# Patient Record
Sex: Male | Born: 1935
Health system: Southern US, Community
[De-identification: ages and names within clinical notes are randomized; demographics above are authoritative.]

## PROBLEM LIST (undated history)

## (undated) DIAGNOSIS — I714 Abdominal aortic aneurysm, without rupture, unspecified: Secondary | ICD-10-CM

## (undated) DIAGNOSIS — R42 Dizziness and giddiness: Secondary | ICD-10-CM

## (undated) DIAGNOSIS — I739 Peripheral vascular disease, unspecified: Secondary | ICD-10-CM

## (undated) DIAGNOSIS — F419 Anxiety disorder, unspecified: Secondary | ICD-10-CM

## (undated) DIAGNOSIS — N400 Enlarged prostate without lower urinary tract symptoms: Secondary | ICD-10-CM

## (undated) DIAGNOSIS — E785 Hyperlipidemia, unspecified: Secondary | ICD-10-CM

## (undated) DIAGNOSIS — M1990 Unspecified osteoarthritis, unspecified site: Secondary | ICD-10-CM

## (undated) DIAGNOSIS — C3492 Malignant neoplasm of unspecified part of left bronchus or lung: Secondary | ICD-10-CM

## (undated) HISTORY — DX: Abdominal aortic aneurysm, without rupture: I71.4

## (undated) HISTORY — PX: OTHER SURGICAL HISTORY: SHX169

## (undated) HISTORY — PX: HEMORRHOID SURGERY: SHX153

## (undated) HISTORY — PX: HERNIA REPAIR: SHX51

## (undated) HISTORY — DX: Abdominal aortic aneurysm, without rupture, unspecified: I71.40

---

## 2010-09-19 NOTE — H&P (Signed)
Edward Campos is an 77 y.o. male.   Chief Complaint: right inguinal hernia HPI: Has developed a right inguinal hernia over the past month.  Made worse with straining.  No past medical history on file.  No past surgical history on file.  No family history on file. Social History:  does not have a smoking history on file. He does not have any smokeless tobacco history on file. His alcohol and drug histories not on file.  Allergies: Allergies not on file  No current facility-administered medications on file as of .   No current outpatient prescriptions on file as of .    No results found for this or any previous visit (from the past 48 hour(s)). No results found.  Review of Systems  Constitutional: Negative.   HENT: Negative.   Eyes: Negative.   Respiratory: Negative.   Cardiovascular: Negative.   Gastrointestinal: Negative.   Genitourinary: Negative.   Musculoskeletal: Negative.   Skin: Negative.   Neurological: Negative.   Endo/Heme/Allergies: Negative.   Psychiatric/Behavioral: Negative.     There were no vitals taken for this visit. Physical Exam  Constitutional: He is oriented to person, place, and time. He appears well-developed and well-nourished.  HENT:  Head: Normocephalic and atraumatic.  Eyes: Pupils are equal, round, and reactive to light.  Neck: Normal range of motion. Neck supple.  Cardiovascular: Normal rate, regular rhythm and normal heart sounds.   Respiratory: Effort normal and breath sounds normal.  GI: Soft. Bowel sounds are normal. He exhibits no distension and no mass. There is no tenderness.       Reducible right inguinal hernia noted.  Genitourinary: Penis normal.  Musculoskeletal: Normal range of motion.  Neurological: He is alert and oriented to person, place, and time.  Skin: Skin is warm and dry.  Psychiatric: He has a normal mood and affect. His behavior is normal. Judgment and thought content normal.     Assessment/Plan Right inguinal  hernia/Scheduled for right inguinal herniorrhaphy on 10/16/10.  Marbeth Smedley A 09/19/2010, 5:21 PM   2

## 2010-10-10 ENCOUNTER — Encounter (HOSPITAL_COMMUNITY)
Admission: RE | Admit: 2010-10-10 | Discharge: 2010-10-10 | Disposition: A | Discharge status: Home / Self Care | Payer: Medicare Other | Source: Ambulatory Visit | Attending: General Surgery | Admitting: General Surgery

## 2010-10-10 ENCOUNTER — Encounter (HOSPITAL_COMMUNITY): Payer: Self-pay

## 2010-10-10 ENCOUNTER — Other Ambulatory Visit: Payer: Self-pay

## 2010-10-10 HISTORY — DX: Benign prostatic hyperplasia without lower urinary tract symptoms: N40.0

## 2010-10-10 HISTORY — DX: Hyperlipidemia, unspecified: E78.5

## 2010-10-10 HISTORY — DX: Unspecified osteoarthritis, unspecified site: M19.90

## 2010-10-10 HISTORY — DX: Dizziness and giddiness: R42

## 2010-10-10 LAB — BASIC METABOLIC PANEL
BUN: 15 mg/dL (ref 6–23)
Creatinine, Ser: 0.84 mg/dL (ref 0.50–1.35)
GFR calc Af Amer: >60 mL/min (ref >60)
GFR calc non Af Amer: >60 mL/min (ref >60)

## 2010-10-10 LAB — DIFFERENTIAL
Basophils Absolute: 0.1 10*3/uL (ref 0.0–0.1)
Basophils Relative: 1 % (ref 0–1)
Eosinophils Absolute: 0.3 10*3/uL (ref 0.0–0.7)
Monocytes Absolute: 0.8 10*3/uL (ref 0.1–1.0)
Neutro Abs: 6 10*3/uL (ref 1.7–7.7)
Neutrophils Relative %: 65 % (ref 43–77)

## 2010-10-10 LAB — CBC
MCH: 32.9 pg (ref 26.0–34.0)
MCHC: 36.4 g/dL — ABNORMAL HIGH (ref 30.0–36.0)
RDW: 12.7 % (ref 11.5–15.5)

## 2010-10-10 LAB — SURGICAL PCR SCREEN: Staphylococcus aureus: NEGATIVE

## 2010-10-10 NOTE — Patient Instructions (Addendum)
20 Edward Campos  10/10/2010   Your procedure is scheduled on: 10/16/10  Report to [facility] at [place] [facility].  Call this number if you have problems the morning of surgery: [phone]   Remember:   Do not eat food:After Midnight.  Do not drink clear liquids: After Midnight.  Take these medicines the morning of surgery with A SIP OF WATER:no meds needed   Do not wear jewelry, make-up or nail polish.  Do not wear lotions, powders, or perfumes. You may wear deodorant.  Do not shave 48 hours prior to surgery.  Do not bring valuables to the hospital.  Contacts, dentures or bridgework may not be worn into surgery.  Leave suitcase in the car. After surgery it may be brought to your room.  For patients admitted to the hospital, checkout time is 11:00 AM the day of discharge.   Patients discharged the day of surgery will not be allowed to drive home.  Name and phone number of your driver: family  Special Instructions: CHG Shower Use Special Wash: 1/2 bottle night before surgery and 1/2 bottle morning of surgery.   Please read over the following fact sheets that you were given: Pain Booklet, MRSA Information, Surgical Site Infection Prevention, Anesthesia Post-op Instructions and Care and Recovery After Surgery PATIENT INSTRUCTIONS POST-ANESTHESIA  IMMEDIATELY FOLLOWING SURGERY:  Do not drive or operate machinery for the first twenty four hours after surgery.  Do not make any important decisions for twenty four hours after surgery or while taking narcotic pain medications or sedatives.  If you develop intractable nausea and vomiting or a severe headache please notify your doctor immediately.  FOLLOW-UP:  Please make an appointment with your surgeon as instructed. You do not need to follow up with anesthesia unless specifically instructed to do so.  WOUND CARE INSTRUCTIONS (if applicable):  Keep a dry clean dressing on the anesthesia/puncture wound site if there is drainage.  Once the wound has quit  draining you may leave it open to air.  Generally you should leave the bandage intact for twenty four hours unless there is drainage.  If the epidural site drains for more than 36-48 hours please call the anesthesia department.  QUESTIONS?:  Please feel free to call your physician or the hospital operator if you have any questions, and they will be happy to assist you.     [facility] Anesthesia Department [address] [place] [phone]

## 2010-10-16 ENCOUNTER — Encounter (HOSPITAL_COMMUNITY): Payer: Self-pay | Admitting: Anesthesiology

## 2010-10-16 ENCOUNTER — Encounter (HOSPITAL_COMMUNITY): Payer: Self-pay

## 2010-10-16 ENCOUNTER — Ambulatory Visit (HOSPITAL_COMMUNITY): Payer: Medicare Other | Admitting: Anesthesiology

## 2010-10-16 ENCOUNTER — Encounter (HOSPITAL_COMMUNITY)
Admission: RE | Disposition: A | Discharge status: Home / Self Care | Payer: Self-pay | Source: Ambulatory Visit | Attending: General Surgery

## 2010-10-16 ENCOUNTER — Ambulatory Visit (HOSPITAL_COMMUNITY)
Admission: RE | Admit: 2010-10-16 | Discharge: 2010-10-16 | Disposition: A | Discharge status: Home / Self Care | Payer: Medicare Other | Source: Ambulatory Visit | Attending: General Surgery | Admitting: General Surgery

## 2010-10-16 DIAGNOSIS — Z4789 Encounter for other orthopedic aftercare: Secondary | ICD-10-CM

## 2010-10-16 DIAGNOSIS — K4090 Unilateral inguinal hernia, without obstruction or gangrene, not specified as recurrent: Secondary | ICD-10-CM | POA: Insufficient documentation

## 2010-10-16 DIAGNOSIS — Z01812 Encounter for preprocedural laboratory examination: Secondary | ICD-10-CM | POA: Insufficient documentation

## 2010-10-16 HISTORY — PX: INGUINAL HERNIA REPAIR: SHX194

## 2010-10-16 SURGERY — REPAIR, HERNIA, INGUINAL, ADULT
Anesthesia: General | Laterality: Right | Wound class: Clean

## 2010-10-16 MED ORDER — FENTANYL CITRATE 0.05 MG/ML IJ SOLN: 25.0000 ug | INTRAMUSCULAR | Status: DC | PRN

## 2010-10-16 MED ORDER — BUPIVACAINE HCL (PF) 0.5 % IJ SOLN
INTRAMUSCULAR | Status: DC | PRN
  Administered 2010-10-16: 10 mL

## 2010-10-16 MED ORDER — LIDOCAINE IN DEXTROSE 5-7.5 % IV SOLN
INTRAVENOUS | Status: DC | PRN
  Administered 2010-10-16: 100 mg via INTRATHECAL

## 2010-10-16 MED ORDER — KETOROLAC TROMETHAMINE 30 MG/ML IJ SOLN
30.0000 mg | Freq: Once | INTRAMUSCULAR | Status: AC
  Administered 2010-10-16: 30 mg via INTRAVENOUS

## 2010-10-16 MED ORDER — MIDAZOLAM HCL 2 MG/2ML IJ SOLN
1.0000 mg | INTRAMUSCULAR | Status: DC | PRN
  Administered 2010-10-16: 2 mg via INTRAVENOUS

## 2010-10-16 MED ORDER — EPHEDRINE SULFATE 50 MG/ML IJ SOLN
INTRAMUSCULAR | Status: AC
  Filled 2010-10-16: qty 1

## 2010-10-16 MED ORDER — LACTATED RINGERS IV SOLN
INTRAVENOUS | Status: DC | PRN
  Administered 2010-10-16: 07:00:00 via INTRAVENOUS

## 2010-10-16 MED ORDER — SODIUM CHLORIDE 0.9 % IR SOLN
Status: DC | PRN
  Administered 2010-10-16: 1000 mL

## 2010-10-16 MED ORDER — HYDROCODONE-ACETAMINOPHEN 5-325 MG PO TABS: ORAL_TABLET | ORAL | Status: DC

## 2010-10-16 MED ORDER — EPHEDRINE SULFATE 50 MG/ML IJ SOLN
INTRAMUSCULAR | Status: DC | PRN
  Administered 2010-10-16 (×2): 10 mg via INTRAVENOUS

## 2010-10-16 MED ORDER — ENOXAPARIN SODIUM 40 MG/0.4ML ~~LOC~~ SOLN
40.0000 mg | Freq: Once | SUBCUTANEOUS | Status: AC
  Administered 2010-10-16: 40 mg via SUBCUTANEOUS

## 2010-10-16 MED ORDER — FENTANYL CITRATE 0.05 MG/ML IJ SOLN
INTRAMUSCULAR | Status: DC | PRN
  Administered 2010-10-16: 25 ug via INTRAVENOUS

## 2010-10-16 MED ORDER — FENTANYL CITRATE 0.05 MG/ML IJ SOLN
INTRAMUSCULAR | Status: DC | PRN
  Administered 2010-10-16: 25 ug via INTRATHECAL

## 2010-10-16 MED ORDER — VANCOMYCIN HCL IN DEXTROSE 1-5 GM/200ML-% IV SOLN
INTRAVENOUS | Status: AC
  Administered 2010-10-16: 1000 mg via INTRAVENOUS
  Filled 2010-10-16: qty 200

## 2010-10-16 MED ORDER — PROPOFOL 10 MG/ML IV EMUL
INTRAVENOUS | Status: AC
  Filled 2010-10-16: qty 20

## 2010-10-16 MED ORDER — VANCOMYCIN HCL IN DEXTROSE 1-5 GM/200ML-% IV SOLN
1000.0000 mg | Freq: Once | INTRAVENOUS | Status: AC
  Administered 2010-10-16: 1000 mg via INTRAVENOUS

## 2010-10-16 MED ORDER — ONDANSETRON HCL 4 MG/2ML IJ SOLN: 4.0000 mg | Freq: Once | INTRAMUSCULAR | Status: DC | PRN

## 2010-10-16 MED ORDER — KETOROLAC TROMETHAMINE 30 MG/ML IJ SOLN
INTRAMUSCULAR | Status: AC
  Administered 2010-10-16: 30 mg via INTRAVENOUS
  Filled 2010-10-16: qty 1

## 2010-10-16 MED ORDER — MIDAZOLAM HCL 2 MG/2ML IJ SOLN
INTRAMUSCULAR | Status: AC
  Filled 2010-10-16: qty 2

## 2010-10-16 MED ORDER — BUPIVACAINE HCL (PF) 0.5 % IJ SOLN
INTRAMUSCULAR | Status: AC
  Filled 2010-10-16: qty 30

## 2010-10-16 MED ORDER — ENOXAPARIN SODIUM 40 MG/0.4ML ~~LOC~~ SOLN
SUBCUTANEOUS | Status: AC
  Filled 2010-10-16: qty 0.4

## 2010-10-16 MED ORDER — FENTANYL CITRATE 0.05 MG/ML IJ SOLN
INTRAMUSCULAR | Status: AC
  Filled 2010-10-16: qty 2

## 2010-10-16 MED ORDER — MIDAZOLAM HCL 2 MG/2ML IJ SOLN
INTRAMUSCULAR | Status: AC
  Administered 2010-10-16: 2 mg via INTRAVENOUS
  Filled 2010-10-16: qty 2

## 2010-10-16 MED ORDER — LACTATED RINGERS IV SOLN
INTRAVENOUS | Status: DC
  Administered 2010-10-16: 07:00:00 via INTRAVENOUS

## 2010-10-16 MED ORDER — PROPOFOL 10 MG/ML IV EMUL
INTRAVENOUS | Status: DC | PRN
  Administered 2010-10-16: 25 ug/kg/min via INTRAVENOUS

## 2010-10-16 MED ORDER — VANCOMYCIN HCL 1000 MG IV SOLR
1000.0000 mg | INTRAVENOUS | Status: DC | PRN
  Administered 2010-10-16: 1 g via INTRAVENOUS

## 2010-10-16 MED ORDER — MIDAZOLAM HCL 5 MG/5ML IJ SOLN
INTRAMUSCULAR | Status: DC | PRN
  Administered 2010-10-16: 1 mg via INTRAVENOUS

## 2010-10-16 SURGICAL SUPPLY — 43 items
ADH SKN CLS APL DERMABOND .7 (GAUZE/BANDAGES/DRESSINGS) ×1
BAG HAMPER (MISCELLANEOUS) ×2 IMPLANT
CLOTH BEACON ORANGE TIMEOUT ST (SAFETY) ×2 IMPLANT
COVER LIGHT HANDLE STERIS (MISCELLANEOUS) ×4 IMPLANT
DECANTER SPIKE VIAL GLASS SM (MISCELLANEOUS) ×2 IMPLANT
DERMABOND ADVANCED (GAUZE/BANDAGES/DRESSINGS) ×1
DERMABOND ADVANCED .7 DNX12 (GAUZE/BANDAGES/DRESSINGS) ×1 IMPLANT
DRAIN PENROSE 18X.75 LTX STRL (MISCELLANEOUS) ×2 IMPLANT
ELECT REM PT RETURN 9FT ADLT (ELECTROSURGICAL) ×2
ELECTRODE REM PT RTRN 9FT ADLT (ELECTROSURGICAL) ×1 IMPLANT
FORMALIN 10 PREFIL 120ML (MISCELLANEOUS) IMPLANT
GLOVE BIO SURGEON STRL SZ7.5 (GLOVE) ×2 IMPLANT
GLOVE ECLIPSE 6.5 STRL STRAW (GLOVE) ×1 IMPLANT
GLOVE ECLIPSE 7.0 STRL STRAW (GLOVE) ×1 IMPLANT
GLOVE ECLIPSE 8.0 STRL XLNG CF (GLOVE) ×1 IMPLANT
GLOVE INDICATOR 7.0 STRL GRN (GLOVE) ×2 IMPLANT
GLOVE INDICATOR 8.5 STRL (GLOVE) ×2 IMPLANT
GOWN BRE IMP SLV AUR XL STRL (GOWN DISPOSABLE) ×6 IMPLANT
GOWN STRL REIN 3XL LVL4 (GOWN DISPOSABLE) ×1 IMPLANT
INST SET MINOR GENERAL (KITS) ×2 IMPLANT
KIT ROOM TURNOVER APOR (KITS) ×2 IMPLANT
MANIFOLD NEPTUNE II (INSTRUMENTS) ×2 IMPLANT
MESH MARLEX PLUG MEDIUM (Mesh General) ×1 IMPLANT
NDL HYPO 25X1 1.5 SAFETY (NEEDLE) ×1 IMPLANT
NEEDLE HYPO 25X1 1.5 SAFETY (NEEDLE) ×2 IMPLANT
NS IRRIG 1000ML POUR BTL (IV SOLUTION) ×2 IMPLANT
PACK MINOR (CUSTOM PROCEDURE TRAY) ×2 IMPLANT
PAD ARMBOARD 7.5X6 YLW CONV (MISCELLANEOUS) ×2 IMPLANT
PAIN PUMP ON-Q 100MLX2ML 2.5IN (PAIN MANAGEMENT) IMPLANT
SET BASIN LINEN APH (SET/KITS/TRAYS/PACK) ×2 IMPLANT
SOL PREP PROV IODINE SCRUB 4OZ (MISCELLANEOUS) ×2 IMPLANT
SUT NOVA NAB GS-22 2 2-0 T-19 (SUTURE) ×2 IMPLANT
SUT NOVAFIL NAB HGS22 2-0 30IN (SUTURE) IMPLANT
SUT SILK 3 0 (SUTURE)
SUT SILK 3-0 18XBRD TIE 12 (SUTURE) ×1 IMPLANT
SUT VIC AB 2-0 CT1 27 (SUTURE) ×2
SUT VIC AB 2-0 CT1 TAPERPNT 27 (SUTURE) ×1 IMPLANT
SUT VIC AB 3-0 SH 27 (SUTURE) ×2
SUT VIC AB 3-0 SH 27X BRD (SUTURE) ×1 IMPLANT
SUT VIC AB 4-0 PS2 27 (SUTURE) ×2 IMPLANT
SUT VICRYL AB 3 0 TIES (SUTURE) IMPLANT
SYR CONTROL 10ML LL (SYRINGE) ×2 IMPLANT
TOWEL OR 17X26 4PK STRL BLUE (TOWEL DISPOSABLE) ×2 IMPLANT

## 2010-10-16 NOTE — Transfer of Care (Signed)
Immediate Anesthesia Transfer of Care Note  Patient: Edward Campos  Procedure(s) Performed:  HERNIA REPAIR INGUINAL ADULT  Patient Location: PACU  Anesthesia Type: Spinal  Level of Consciousness: awake, alert  and oriented  Airway & Oxygen Therapy: Patient Spontanous Breathing and Patient connected to face mask oxygen  Post-op Assessment: Report given to PACU RN and Post -op Vital signs reviewed and stable  Post vital signs: stable  Complications: No apparent anesthesia complications

## 2010-10-16 NOTE — Anesthesia Procedure Notes (Addendum)
Spinal Block  Patient location during procedure: OR Start time: 10/16/2010 8:20 AM Staffing CRNA/Resident: Michae Grimley J Preanesthetic Checklist Completed: patient identified, site marked, surgical consent, pre-op evaluation, timeout performed, IV checked, risks and benefits discussed and monitors and equipment checked Spinal Block Patient position: right lateral decubitus Prep: Betadine Patient monitoring: heart rate, cardiac monitor, continuous pulse ox and blood pressure Approach: midline Location: L3-4 Injection technique: single-shot Needle Needle type: Spinocan  Assessment Sensory level: T8

## 2010-10-16 NOTE — Op Note (Signed)
Patient:  Edward Campos  DOB:  [birth year]  MRN:  [id number]   Preop Diagnosis:  Right inguinal hernia  Postop Diagnosis:  Same, indirect  Procedure:  Right inguinal herniorrhaphy  Surgeon:  Franky Macho, M.D.  Anes:  Spinal  Indications:  Patient is a 77 year old white male who presents with a right inguinal hernia. The risks and benefits of the procedure including bleeding, infection, and a possibly recurrence of the hernia were fully explained to the patient, gave informed consent.  Procedure note:  Patient was placed in the supine position after spinal anesthesia was administered. The right groin region was prepped and draped using the usual sterile technique with Betadine. Surgical site confirmation was performed.  A transverse incision was made in the right groin region down to the external oblique aponeuroses. The aponeurosis was incised to the external ring. A Penrose drain was placed around the spermatic cord. The vas deferens was noted within the spermatic cord. The ilioinguinal nerve was identified and retracted inferiorly from the operative field. An indirect hernia sac was found. This was freed away from the spermatic cord up to the peritoneal reflection and inverted. A medium size Bard mesh plug was then placed in this region secured to the transversalis fascia using a 2-0 Novafil interrupted suture. A mesh patch was then placed along the floor of the inguinal canal and secured superiorly to the conjoined tendon and inferiorly to the shelving edge of Poupart's ligament using 2-0 Novafil interrupted sutures. The internal ring was recreated using a 2-0 Novafil suture. The external oblique aponeuroses was reapproximated using 2-0 Vicryl suture. The subcutaneous layer was reapproximated using 3-0 Vicryl interrupted sutures. The skin was closed using a 4-0 Vicryl subcuticular suture. 0.5% Sensorcaine was instilled in the surrounding wound. Dermabond was then applied.  All tape and needle  counts were correct at the end of the procedure. The patient was transferred back to PACU in stable condition.  Complications:  None  EBL:  Minimal  Specimen:  None

## 2010-10-16 NOTE — Anesthesia Preprocedure Evaluation (Addendum)
Anesthesia Evaluation  Name, MR# and DOB Patient awake  General Assessment Comment  Reviewed: Allergy & Precautions, H&P  and Patient's Chart, lab work & pertinent test results  Airway Mallampati: II  Neck ROM: Full    Dental  (+) Edentulous Upper   Pulmonary (+) shortness of breath and With exertion COPD  clear to auscultation    Cardiovascular Irregular     Neuro/Psych   GI/Hepatic/Renal   Endo/Other    Abdominal   Musculoskeletal   Hematology   Peds  Reproductive/Obstetrics    Anesthesia Other Findings             Anesthesia Physical Anesthesia Plan  ASA: III  Anesthesia Plan: Spinal   Post-op Pain Management:    Induction:   Airway Management Planned: Simple Face Mask  Additional Equipment:   Intra-op Plan:   Post-operative Plan:   Informed Consent: I have reviewed the patients History and Physical, chart, labs and discussed the procedure including the risks, benefits and alternatives for the proposed anesthesia with the patient or authorized representative who has indicated his/her understanding and acceptance.     Plan Discussed with: CRNA  Anesthesia Plan Comments:         Anesthesia Quick Evaluation

## 2010-10-16 NOTE — Interval H&P Note (Signed)
I have evaluated Edward Campos preoperatively, and have identified no interval change in the medical condition or plan of care since the history and physical of record.

## 2010-10-16 NOTE — Anesthesia Postprocedure Evaluation (Signed)
  Anesthesia Post-op Note  Patient: Edward Campos  Procedure(s) Performed:  HERNIA REPAIR INGUINAL ADULT  Patient Location: PACU  Anesthesia Type: Spinal  Level of Consciousness: alert   Airway and Oxygen Therapy: Patient Spontanous Breathing  Post-op Pain: mild  Post-op Assessment: Patient's Cardiovascular Status Stable, Respiratory Function Stable, Patent Airway and No signs of Nausea or vomiting  Post-op Vital Signs: stable  Complications: No apparent anesthesia complications

## 2010-10-17 NOTE — Anesthesia Postprocedure Evaluation (Signed)
  Anesthesia Post-op Note  Patient: Edward Campos  Procedure(s) Performed:  HERNIA REPAIR INGUINAL ADULT  Patient Location: PACU  Anesthesia Type: General  Level of Consciousness: awake, alert , oriented and patient cooperative  Airway and Oxygen Therapy: Patient Spontanous Breathing  Post-op Pain: none  Post-op Assessment: Post-op Vital signs reviewed, Patient's Cardiovascular Status Stable, Respiratory Function Stable, Patent Airway, No signs of Nausea or vomiting, Adequate PO intake and Pain level controlled  Post-op Vital Signs: Reviewed and stable  Complications: No apparent anesthesia complications

## 2010-10-25 ENCOUNTER — Encounter (HOSPITAL_COMMUNITY): Payer: Self-pay | Admitting: General Surgery

## 2011-02-28 NOTE — Addendum Note (Signed)
Addendum  created 02/28/11 1103 by Despina Hidden   Modules edited:Notes Section

## 2014-08-26 DIAGNOSIS — M25551 Pain in right hip: Secondary | ICD-10-CM | POA: Diagnosis not present

## 2014-08-26 DIAGNOSIS — M5416 Radiculopathy, lumbar region: Secondary | ICD-10-CM | POA: Diagnosis not present

## 2014-08-31 DIAGNOSIS — M545 Low back pain: Secondary | ICD-10-CM | POA: Diagnosis not present

## 2014-09-02 DIAGNOSIS — M545 Low back pain: Secondary | ICD-10-CM | POA: Diagnosis not present

## 2014-09-02 DIAGNOSIS — M5416 Radiculopathy, lumbar region: Secondary | ICD-10-CM | POA: Diagnosis not present

## 2014-09-07 DIAGNOSIS — M5416 Radiculopathy, lumbar region: Secondary | ICD-10-CM | POA: Diagnosis not present

## 2014-09-07 DIAGNOSIS — M4806 Spinal stenosis, lumbar region: Secondary | ICD-10-CM | POA: Diagnosis not present

## 2014-09-07 DIAGNOSIS — M545 Low back pain: Secondary | ICD-10-CM | POA: Diagnosis not present

## 2016-09-11 DIAGNOSIS — M1000 Idiopathic gout, unspecified site: Secondary | ICD-10-CM | POA: Diagnosis not present

## 2019-02-18 ENCOUNTER — Other Ambulatory Visit: Payer: Self-pay

## 2019-02-18 DIAGNOSIS — I714 Abdominal aortic aneurysm, without rupture, unspecified: Secondary | ICD-10-CM

## 2019-02-19 ENCOUNTER — Ambulatory Visit (INDEPENDENT_AMBULATORY_CARE_PROVIDER_SITE_OTHER): Payer: Medicare Other | Admitting: Vascular Surgery

## 2019-02-19 ENCOUNTER — Ambulatory Visit (HOSPITAL_COMMUNITY)
Admission: RE | Admit: 2019-02-19 | Discharge: 2019-02-19 | Disposition: A | Discharge status: Home / Self Care | Payer: Medicare Other | Source: Ambulatory Visit | Attending: Vascular Surgery | Admitting: Vascular Surgery

## 2019-02-19 ENCOUNTER — Other Ambulatory Visit: Payer: Self-pay | Admitting: Vascular Surgery

## 2019-02-19 ENCOUNTER — Other Ambulatory Visit: Payer: Self-pay

## 2019-02-19 ENCOUNTER — Encounter: Payer: Self-pay | Admitting: Vascular Surgery

## 2019-02-19 VITALS — BP 109/65 | HR 63 | Temp 97.9°F | Resp 20 | Ht 70.5 in | Wt 156.0 lb

## 2019-02-19 DIAGNOSIS — I739 Peripheral vascular disease, unspecified: Secondary | ICD-10-CM

## 2019-02-19 DIAGNOSIS — I714 Abdominal aortic aneurysm, without rupture, unspecified: Secondary | ICD-10-CM

## 2019-02-19 NOTE — Progress Notes (Signed)
Referring Physician: Dr Orion Modest Patient name: Edward Campos MRN: [id number] DOB: [birth year] Sex: male  REASON FOR CONSULT: Abdominal aortic aneurysm  HPI: Edward Campos is a 85 y.o. male, sent for evaluation of abdominal aortic aneurysm.  This was apparently discovered recently on an MRI exam done for back and leg pain.  Patient does not have family history of aneurysm.  He does not have abdominal pain.  He has chronic back pain.  He also has noticed over the last 4 years he has pain that occurs in the calf and posterior thigh both legs after walking about a block.  Both legs are fairly equal.  He does have a history of tobacco abuse.  He currently smokes about 1/4 pack of cigarettes per day.  He was counseled against this today.  He does not have rest pain in his feet.  He has no nonhealing wounds.  Other medical problems include multijoint arthritis dizziness hyperlipidemia.  These are all currently controlled.  Past Medical History:  Diagnosis Date  . AAA (abdominal aortic aneurysm) ([place])   . Arthritis   . BPH (benign prostatic hyperplasia)   . Dizziness   . Hyperlipidemia    Past Surgical History:  Procedure Laterality Date  . growth removed from chest    . HEMORRHOID SURGERY    . HERNIA REPAIR  12 yrs ago   left inguinal hernia  . INGUINAL HERNIA REPAIR  10/16/2010   Procedure: HERNIA REPAIR INGUINAL ADULT;  Surgeon: Jamesetta So;  Location: [facility];  Service: General;  Laterality: Right;    Family History  Problem Relation Age of Onset  . Pseudochol deficiency Neg Hx   . Malignant hyperthermia Neg Hx   . Hypotension Neg Hx   . Anesthesia problems Neg Hx     SOCIAL HISTORY: Social History   Socioeconomic History  . Marital status: Single    Spouse name: Not on file  . Number of children: Not on file  . Years of education: Not on file  . Highest education level: Not on file  Occupational History  . Not on file  Tobacco Use  . Smoking status: Current Every  Day Smoker    Packs/day: 0.25    Years: 60.00    Pack years: 15.00    Types: Cigarettes  . Smokeless tobacco: Never Used  Substance and Sexual Activity  . Alcohol use: Yes    Alcohol/week: 3.0 standard drinks    Types: 3 Shots of liquor per week    Comment: 3 2oz. shots of bourbon/day  . Drug use: No  . Sexual activity: Not on file  Other Topics Concern  . Not on file  Social History Narrative  . Not on file   Social Determinants of Health   Financial Resource Strain:   . Difficulty of Paying Living Expenses: Not on file  Food Insecurity:   . Worried About Charity fundraiser in the Last Year: Not on file  . Ran Out of Food in the Last Year: Not on file  Transportation Needs:   . Lack of Transportation (Medical): Not on file  . Lack of Transportation (Non-Medical): Not on file  Physical Activity:   . Days of Exercise per Week: Not on file  . Minutes of Exercise per Session: Not on file  Stress:   . Feeling of Stress : Not on file  Social Connections:   . Frequency of Communication with Friends and Family: Not on file  . Frequency of Social  Gatherings with Friends and Family: Not on file  . Attends Religious Services: Not on file  . Active Member of Clubs or Organizations: Not on file  . Attends BankerClub or Organization Meetings: Not on file  . Marital Status: Not on file  Intimate Partner Violence:   . Fear of Current or Ex-Partner: Not on file  . Emotionally Abused: Not on file  . Physically Abused: Not on file  . Sexually Abused: Not on file    Allergies  Allergen Reactions  . Penicillins     unknown    Current Outpatient Medications  Medication Sig Dispense Refill  . ibuprofen (ADVIL,MOTRIN) 200 MG tablet Take 200 mg by mouth every 6 (six) hours as needed. For pain     . Multiple Vitamins-Minerals (MULTIVITAMIN WITH MINERALS) tablet Take 1 tablet by mouth daily.       No current facility-administered medications for this visit.    ROS:   General:  No  weight loss, Fever, chills  HEENT: No recent headaches, no nasal bleeding, no visual changes, no sore throat  Neurologic: No dizziness, blackouts, seizures. No recent symptoms of stroke or mini- stroke. No recent episodes of slurred speech, or temporary blindness.  Cardiac: No recent episodes of chest pain/pressure, no shortness of breath at rest.  No shortness of breath with exertion.  Denies history of atrial fibrillation or irregular heartbeat  Vascular: No history of rest pain in feet.  No history of claudication.  No history of non-healing ulcer, No history of DVT   Pulmonary: No home oxygen, no productive cough, no hemoptysis,  No asthma or wheezing  Musculoskeletal:  [X]  Arthritis, [X]  Low back pain,  [X]  Joint pain  Hematologic:No history of hypercoagulable state.  No history of easy bleeding.  No history of anemia  Gastrointestinal: No hematochezia or melena,  No gastroesophageal reflux, no trouble swallowing  Urinary: [ ]  chronic Kidney disease, [ ]  on HD - [ ]  MWF or [ ]  TTHS, [ ]  Burning with urination, [ ]  Frequent urination, [ ]  Difficulty urinating;   Skin: No rashes  Psychological: No history of anxiety,  No history of depression   Physical Examination  Vitals:   02/19/19 1038  BP: 109/65  Pulse: 63  Resp: 20  Temp: 97.9 F (36.6 C)  SpO2: 99%  Weight: 156 lb (70.8 kg)  Height: 5' 10.5" (1.791 m)    Body mass index is 22.07 kg/m.  General:  Alert and oriented, no acute distress HEENT: Normal Neck: No JVD Cardiac: Regular Rate and Rhythm  Abdomen: Soft, non-tender, non-distended, no mass Skin: No rash Extremity Pulses:  2+ radial, brachial, absent femoral, popliteal dorsalis pedis, posterior tibial pulses bilaterally, firm structure both groins suggestive of heavy calcification of the common femoral artery Musculoskeletal: No deformity or edema  Neurologic: Upper and lower extremity motor 5/5 and symmetric  DATA:  Patient had an aortic ultrasound  today which shows the aortic diameter is 4.6 cm.  The iliac arteries were not visualized due to overlying bowel gas.  I reviewed and interpreted the study.  ASSESSMENT: With 4.6 cm infrarenal abdominal aortic aneurysm.  Unfortunately the iliac arteries were not visualized today and we need to exclude iliac artery aneurysm involvement as well.  I discussed the patient today that if the aneurysm truly is 4.6 cm in diameter we would only consider repair if it got to 5-5 and half centimeters in diameter.  The patient may also have an element of occlusive disease as he does  not have easily palpable pulses in either leg and has some symptoms that could suggest claudication.   PLAN: Patient will be scheduled for CT angiogram with lower extremity runoff to further define his lower extremity arterial tree as well as to rule out iliac aneurysms.  We will also obtain bilateral ABIs on his next office visit.  Consideration will also need to be given for starting a statin and aspirin if he has evidence of peripheral arterial disease.   Fabienne Bruns, MD [facility] Office: [phone] Pager: [phone]

## 2019-03-03 ENCOUNTER — Inpatient Hospital Stay: Admission: RE | Admit: 2019-03-03 | Payer: PRIVATE HEALTH INSURANCE | Source: Ambulatory Visit

## 2019-03-03 ENCOUNTER — Ambulatory Visit
Admission: RE | Admit: 2019-03-03 | Discharge: 2019-03-03 | Disposition: A | Discharge status: Home / Self Care | Payer: Medicare Other | Source: Ambulatory Visit | Attending: Vascular Surgery | Admitting: Vascular Surgery

## 2019-03-03 DIAGNOSIS — I714 Abdominal aortic aneurysm, without rupture, unspecified: Secondary | ICD-10-CM

## 2019-03-03 MED ORDER — IOPAMIDOL (ISOVUE-370) INJECTION 76%
75.0000 mL | Freq: Once | INTRAVENOUS | Status: AC | PRN
  Administered 2019-03-03: 75 mL via INTRAVENOUS

## 2019-03-05 ENCOUNTER — Other Ambulatory Visit: Payer: Self-pay

## 2019-03-05 DIAGNOSIS — I739 Peripheral vascular disease, unspecified: Secondary | ICD-10-CM

## 2019-03-12 ENCOUNTER — Ambulatory Visit (HOSPITAL_COMMUNITY)
Admission: RE | Admit: 2019-03-12 | Discharge: 2019-03-12 | Disposition: A | Discharge status: Home / Self Care | Payer: Medicare Other | Source: Ambulatory Visit | Attending: Vascular Surgery | Admitting: Vascular Surgery

## 2019-03-12 ENCOUNTER — Ambulatory Visit (INDEPENDENT_AMBULATORY_CARE_PROVIDER_SITE_OTHER): Payer: Medicare Other | Admitting: Vascular Surgery

## 2019-03-12 ENCOUNTER — Other Ambulatory Visit: Payer: Self-pay

## 2019-03-12 ENCOUNTER — Encounter: Payer: Self-pay | Admitting: Vascular Surgery

## 2019-03-12 VITALS — BP 132/73 | HR 58 | Temp 97.6°F | Resp 20 | Ht 70.5 in | Wt 163.0 lb

## 2019-03-12 DIAGNOSIS — I714 Abdominal aortic aneurysm, without rupture, unspecified: Secondary | ICD-10-CM

## 2019-03-12 DIAGNOSIS — I739 Peripheral vascular disease, unspecified: Secondary | ICD-10-CM

## 2019-03-12 NOTE — Progress Notes (Signed)
Patient is an 85 year old male who returns today for follow-up after recent CT angiogram of the abdomen and pelvis with lower extremity runoff.  He was initially sent for evaluation of his abdominal aortic aneurysm.  He does not have abdominal pain currently.  He does have intermittent chronic back pain.  He continues to have pain over the last 4 years that occurs in the calf and posterior thigh both legs after walking about a block.  He is continuing to try to quit smoking.  Past Medical History:  Diagnosis Date  . AAA (abdominal aortic aneurysm) ([facility])   . Arthritis   . BPH (benign prostatic hyperplasia)   . Dizziness   . Hyperlipidemia     Past Surgical History:  Procedure Laterality Date  . growth removed from chest    . HEMORRHOID SURGERY    . HERNIA REPAIR  12 yrs ago   left inguinal hernia  . INGUINAL HERNIA REPAIR  10/16/2010   Procedure: HERNIA REPAIR INGUINAL ADULT;  Surgeon: Dalia Heading;  Location: [facility];  Service: General;  Laterality: Right;     Current Outpatient Medications on File Prior to Visit  Medication Sig Dispense Refill  . ibuprofen (ADVIL,MOTRIN) 200 MG tablet Take 200 mg by mouth every 6 (six) hours as needed. For pain     . Multiple Vitamins-Minerals (MULTIVITAMIN WITH MINERALS) tablet Take 1 tablet by mouth daily.       No current facility-administered medications on file prior to visit.   Social History   Socioeconomic History  . Marital status: Single    Spouse name: Not on file  . Number of children: Not on file  . Years of education: Not on file  . Highest education level: Not on file  Occupational History  . Not on file  Tobacco Use  . Smoking status: Current Every Day Smoker    Packs/day: 0.25    Years: 60.00    Pack years: 15.00    Types: Cigarettes  . Smokeless tobacco: Never Used  Substance and Sexual Activity  . Alcohol use: Yes    Alcohol/week: 3.0 standard drinks    Types: 3 Shots of liquor per week    Comment: 3 2oz. shots of  bourbon/day  . Drug use: No  . Sexual activity: Not on file  Other Topics Concern  . Not on file  Social History Narrative  . Not on file   Social Determinants of Health   Financial Resource Strain:   . Difficulty of Paying Living Expenses: Not on file  Food Insecurity:   . Worried About Programme researcher, broadcasting/film/video in the Last Year: Not on file  . Ran Out of Food in the Last Year: Not on file  Transportation Needs:   . Lack of Transportation (Medical): Not on file  . Lack of Transportation (Non-Medical): Not on file  Physical Activity:   . Days of Exercise per Week: Not on file  . Minutes of Exercise per Session: Not on file  Stress:   . Feeling of Stress : Not on file  Social Connections:   . Frequency of Communication with Friends and Family: Not on file  . Frequency of Social Gatherings with Friends and Family: Not on file  . Attends Religious Services: Not on file  . Active Member of Clubs or Organizations: Not on file  . Attends Banker Meetings: Not on file  . Marital Status: Not on file  Intimate Partner Violence:   . Fear of Current  or Ex-Partner: Not on file  . Emotionally Abused: Not on file  . Physically Abused: Not on file  . Sexually Abused: Not on file    Physical exam:  Vitals:   03/12/19 1037  BP: 132/73  Pulse: (!) 58  Resp: 20  Temp: 97.6 F (36.4 C)  SpO2: 99%  Weight: 163 lb (73.9 kg)  Height: 5' 10.5" (1.791 m)   General white male no acute distress  Data: I reviewed the patient's CT angiogram images which shows a 4.7 cm infrarenal abdominal aortic aneurysm.  There is a moderate stenosis of the right external iliac and common femoral artery.  The left external iliac and common femoral arteries are occluded.  There is a small left internal iliac artery aneurysm at 1 cm.  Images obtained of the runoff were intact.  Patient had bilateral ABIs performed today which were 0.7 bilaterally I reviewed and interpreted the study.   Assessment:  #1 abdominal aortic aneurysm 4.7 cm in diameter.  He will need a follow-up ultrasound of his aorta in 6 months time.  2.  Left internal iliac artery aneurysm 1 cm consider repair if greater than 3 cm will need to reimage in several years potentially   3.  Peripheral arterial disease with bilateral iliac and common femoral artery occlusive disease.  I discussed with patient today the possibility that revascularization for his claudication type symptoms would most likely involve an aortobifemoral bypass graft.  I discussed with him that this would be a fairly extensive operation and I did not feel that he would be a candidate for any percutaneous options for these lesions.  If he decides to opt for treatment of his claudication we could certainly do an arteriogram as the next step to consider doing an aortobifemoral bypass graft.  Other options could potentially be a femoral-femoral bypass graft however everything is complicated by the fact he also has an aneurysm.  In discussions with the patient he is not really interested in any large operations due to his age.  He will continue to think about his symptoms and if he wishes to pursue this further we will talk about options for this.    Otherwise he will follow-up for his abdominal aortic aneurysm with an ultrasound of his aorta in 6 months time.  Patient did discuss quitting smoking today and I explained to him that certainly this would decrease his risk of limb loss long-term and improve his overall health.  I did discuss with him that at his current level of perfusion his risk of limb loss life time is less than 5% if he is able to quit smoking.  Ruta Hinds, MD [facility] Office: [phone]

## 2019-03-20 ENCOUNTER — Other Ambulatory Visit (HOSPITAL_COMMUNITY): Payer: Self-pay | Admitting: Vascular Surgery

## 2019-03-20 DIAGNOSIS — I714 Abdominal aortic aneurysm, without rupture, unspecified: Secondary | ICD-10-CM

## 2019-03-20 DIAGNOSIS — I739 Peripheral vascular disease, unspecified: Secondary | ICD-10-CM

## 2019-03-30 ENCOUNTER — Encounter (HOSPITAL_COMMUNITY): Payer: Self-pay | Admitting: Physical Therapy

## 2019-03-30 ENCOUNTER — Ambulatory Visit (HOSPITAL_COMMUNITY): Payer: Medicare Other | Attending: Orthopedic Surgery | Admitting: Physical Therapy

## 2019-03-30 ENCOUNTER — Telehealth (HOSPITAL_COMMUNITY): Payer: Self-pay | Admitting: Physical Therapy

## 2019-03-30 ENCOUNTER — Other Ambulatory Visit: Payer: Self-pay

## 2019-03-30 DIAGNOSIS — G8929 Other chronic pain: Secondary | ICD-10-CM | POA: Insufficient documentation

## 2019-03-30 DIAGNOSIS — M6281 Muscle weakness (generalized): Secondary | ICD-10-CM | POA: Insufficient documentation

## 2019-03-30 DIAGNOSIS — M545 Low back pain: Secondary | ICD-10-CM | POA: Diagnosis present

## 2019-03-30 NOTE — Therapy (Signed)
[facility] [address] [place], [place], [address] Phone: [phone]   Fax:  [phone]  Physical Therapy Evaluation  Patient Details  Name: Edward Campos MRN: [id number] Date of Birth: 1932/09/24 Referring Provider (PT): Estill Bamberg Md   Encounter Date: 03/30/2019  PT End of Session - 03/30/19 1527    Visit Number  1    Number of Visits  8    Date for PT Re-Evaluation  05/01/19    Authorization Type  Medicare (80/20) 60 Visit Limit    Authorization Time Period  03/30/19-05/01/19    PT Start Time  1430    PT Stop Time  1515    PT Time Calculation (min)  45 min    Activity Tolerance  Patient tolerated treatment well    Behavior During Therapy  [facility] for tasks assessed/performed       Past Medical History:  Diagnosis Date  . AAA (abdominal aortic aneurysm) (HCC)   . Arthritis   . BPH (benign prostatic hyperplasia)   . Dizziness   . Hyperlipidemia     Past Surgical History:  Procedure Laterality Date  . growth removed from chest    . HEMORRHOID SURGERY    . HERNIA REPAIR  12 yrs ago   left inguinal hernia  . INGUINAL HERNIA REPAIR  10/16/2010   Procedure: HERNIA REPAIR INGUINAL ADULT;  Surgeon: Dalia Heading;  Location: [facility];  Service: General;  Laterality: Right;    There were no vitals filed for this visit.   Subjective Assessment - 03/30/19 1436    Subjective  Patient presents to physical therapy with complaint of low back pain and BLE weakness. Patient says he injured his low back about 4 years ago and has had pain off and on ever since, but states that since November he began to notice that his legs get weak when he bends over to pick things up in his yard. Patient says he was cautious to begin therapy for his back because he was diagnosed with large aortic abdominal aneurism but says that both of his MDs have cleared him for activity and recommended he begin treatment for his lumbar issues. Patient says his RT foot is  always numb.    Limitations  Standing;Walking;House hold activities;Lifting    How long can you stand comfortably?  No problem, only when bending    How long can you walk comfortably?  100 yards    Patient Stated Goals  Improve strength to prepare for possible upcoming surgery, want to be able to go out, qwalk and play golf.    Currently in Pain?  No/denies         [facility] PT Assessment - 03/30/19 0001      Assessment   Medical Diagnosis  LBP and BLE weakness    Referring Provider (PT)  Estill Bamberg Md    Onset Date/Surgical Date  --   November 2020   Prior Therapy  Not for lumbar      Precautions   Precautions  --   AAA( abdominal aneurism) no stated lifitng restriction   Precaution Comments  avoid heavy lifitng, overly strenuous/ compressive activity      Restrictions   Weight Bearing Restrictions  No      Balance Screen   Has the patient fallen in the past 6 months  No    Has the patient had a decrease in activity level because of a fear of falling?   No  Is the patient reluctant to leave their home because of a fear of falling?   No      Home Public house manager residence    Living Arrangements  Alone      Prior Function   Level of Independence  Independent      Cognition   Overall Cognitive Status  Within Functional Limits for tasks assessed      Observation/Other Assessments   Focus on Therapeutic Outcomes (FOTO)   46% Limited      Sensation   Light Touch  Appears Intact   SLight decrease in sensation to light touch about RLE     Posture/Postural Control   Posture/Postural Control  Postural limitations    Postural Limitations  Decreased lumbar lordosis      ROM / Strength   AROM / PROM / Strength  AROM;Strength      AROM   Overall AROM   Within functional limits for tasks performed   good lumbar AROM, excpet min restriction in extension      Strength   Strength Assessment Site  Hip;Knee;Ankle    Right/Left Hip  Right;Left     Right Hip Flexion  4+/5    Right Hip Extension  4+/5    Right Hip ABduction  4+/5    Left Hip Flexion  5/5    Left Hip Extension  4+/5    Left Hip ABduction  4+/5    Right/Left Knee  Left;Right    Right Knee Flexion  4/5    Right Knee Extension  5/5    Left Knee Flexion  4+/5    Left Knee Extension  5/5    Right/Left Ankle  Right;Left    Right Ankle Dorsiflexion  4+/5    Left Ankle Dorsiflexion  5/5      Palpation   Palpation comment  Min/ Mod tenderness to palpation about RT lumbar paraspiinals      Special Tests    Special Tests  --   (-) slump test bilat; (+) sacral compression     Ambulation/Gait   Ambulation/Gait  Yes    Ambulation/Gait Assistance  7: Independent    Gait Pattern  Within Functional Limits      Balance   Balance Assessed  Yes      Static Standing Balance   Static Standing Balance -  Activities   Tandam Stance - Right Leg;Tandam Stance - Left Leg    Static Standing - Comment/# of Minutes  20 sec; 12 sec                 Objective measurements completed on examination: See above findings.              PT Education - 03/30/19 1439    Education Details  Patient educated on evaluation findings, POC and HEP    Person(s) Educated  Patient    Methods  Explanation;Handout    Comprehension  Verbalized understanding       PT Short Term Goals - 03/30/19 1533      PT SHORT TERM GOAL #1   Title  Patient will be independent with initial HEP to improve functional outcomes    Time  2    Period  Weeks    Status  New    Target Date  04/17/19        PT Long Term Goals - 03/30/19 1533      PT LONG TERM GOAL #1   Title  Patient will improve FOTO score to <35%  to indicate improvement in functional outcomes    Time  4    Period  Weeks    Status  New    Target Date  05/01/19      PT LONG TERM GOAL #2   Title  Patient will be able to maintain tandem stance >30 seconds on BLEs to improve stability and reduce risk for falls    Time  4     Period  Weeks    Status  New    Target Date  05/01/19      PT LONG TERM GOAL #3   Title  Patient will report at least 75% overall improvement in subjective complaint to indicate improvement in ability to perform ADLs.    Time  4    Period  Weeks    Status  New    Target Date  05/01/19      PT LONG TERM GOAL #4   Title  Pateint will be able to stand greater than 1 hour during functional activity like yard work or playing golf    Time  4    Period  Weeks    Status  New    Target Date  05/01/19      PT LONG TERM GOAL #5   Title  Pateitn will be IND with final HEP    Time  4    Period  Weeks    Status  New    Target Date  05/01/19             Plan - 03/30/19 1528    Clinical Impression Statement  Patient is a 86 y.o. male who presents to physical therapy with complaint of low back pain and BLE weakness. Patient demonstrates decreased strength, postural deficits, impaired sensation, balance deficits and decreased activity tolerance which are likely contributing to symptoms of pain and are negatively impacting patient ability to perform ADLs and functional mobility tasks. Patient will benefit from skilled physical therapy services to address these deficits to reduce pain, improve level of function with ADLs, functional mobility tasks, and reduce risk for falls.    Personal Factors and Comorbidities  Age    Examination-Activity Limitations  Bend;Squat;Stand;Transfers;Locomotion Level;Lift    Examination-Participation Restrictions  Community Activity;Yard Work    Stability/Clinical Decision Making  Stable/Uncomplicated    Clinical Decision Making  Low    Rehab Potential  Good    PT Frequency  2x / week    PT Duration  4 weeks    PT Treatment/Interventions  ADLs/Self Care Home Management;Balance training;Therapeutic exercise;Manual techniques;Therapeutic activities;Stair training;Gait training;Neuromuscular re-education;Patient/family education;Taping;Energy conservation;Joint  Manipulations;Spinal Manipulations;Passive range of motion;Functional mobility training;Traction    PT Next Visit Plan  Review goals, assess response to POE HEP, progress BLE and core strenghting as tolerated. Add table core exercise, ab set, ab marching, bridges, progress to sit to stands, sidestepping, add balance activity (tandem stance) as tolerated    PT Home Exercise Plan  03/30/19: POE    Consulted and Agree with Plan of Care  Patient       Patient will benefit from skilled therapeutic intervention in order to improve the following deficits and impairments:  Decreased endurance, Decreased activity tolerance, Decreased strength, Pain, Decreased balance, Decreased mobility, Difficulty walking, Postural dysfunction  Visit Diagnosis: Chronic bilateral low back pain without sciatica  Muscle weakness (generalized)     Problem List There are no problems to display for this patient.  3:38 PM, 03/30/19 Lysbeth Galas  Theresia Bough PT DPT  Physical Therapist with [facility]  [facility]  [phone]   [facility] [address] [place], [place], [address] Phone: [phone]   Fax:  [phone]  Name: Edward Campos MRN: [id number] Date of Birth: Feb 14, 1933

## 2019-03-30 NOTE — Telephone Encounter (Signed)
Pt did not want to come back to back this week

## 2019-03-31 ENCOUNTER — Ambulatory Visit (HOSPITAL_COMMUNITY): Payer: Medicare Other

## 2019-04-01 ENCOUNTER — Other Ambulatory Visit: Payer: Self-pay | Admitting: *Deleted

## 2019-04-01 NOTE — Progress Notes (Signed)
Instructed to be at [facility] for nasal swab 05/04/2019 at 8 am. Report to admitting department at 6:30 am on 05/06/2019 for procedure with Dr. Darrick Penna. NPO past MN night prior. Must have a driver and caregiver for discharge to home. He is going to make plans for this.  Verbalized understanding.

## 2019-04-02 ENCOUNTER — Other Ambulatory Visit: Payer: Self-pay

## 2019-04-02 ENCOUNTER — Encounter (HOSPITAL_COMMUNITY): Payer: Self-pay | Admitting: Physical Therapy

## 2019-04-02 ENCOUNTER — Ambulatory Visit (HOSPITAL_COMMUNITY): Payer: Medicare Other | Admitting: Physical Therapy

## 2019-04-02 DIAGNOSIS — G8929 Other chronic pain: Secondary | ICD-10-CM

## 2019-04-02 DIAGNOSIS — M545 Low back pain, unspecified: Secondary | ICD-10-CM

## 2019-04-02 DIAGNOSIS — M6281 Muscle weakness (generalized): Secondary | ICD-10-CM

## 2019-04-02 NOTE — Therapy (Signed)
[facility] [address] [place], [place], [address] Phone: [phone]   Fax:  [phone]  Physical Therapy Treatment  Patient Details  Name: Edward Campos MRN: [id number] Date of Birth: 1932/04/01 Referring Provider (PT): Estill Bamberg Md   Encounter Date: 04/02/2019  PT End of Session - 04/02/19 1554    Visit Number  2    Number of Visits  8    Date for PT Re-Evaluation  05/01/19    Authorization Type  Medicare (80/20) 60 Visit Limit    Authorization Time Period  03/30/19-05/01/19    PT Start Time  1450    PT Stop Time  1530    PT Time Calculation (min)  40 min    Activity Tolerance  Patient tolerated treatment well    Behavior During Therapy  [facility] for tasks assessed/performed       Past Medical History:  Diagnosis Date  . AAA (abdominal aortic aneurysm) (HCC)   . Arthritis   . BPH (benign prostatic hyperplasia)   . Dizziness   . Hyperlipidemia     Past Surgical History:  Procedure Laterality Date  . growth removed from chest    . HEMORRHOID SURGERY    . HERNIA REPAIR  12 yrs ago   left inguinal hernia  . INGUINAL HERNIA REPAIR  10/16/2010   Procedure: HERNIA REPAIR INGUINAL ADULT;  Surgeon: Dalia Heading;  Location: [facility];  Service: General;  Laterality: Right;    There were no vitals filed for this visit.  Subjective Assessment - 04/02/19 1552    Subjective  Pt reports no significant change from last session.  Reports compliance with HEP.  Brought in report form MRI completed in 2016 of back which indidcates mod-severe lumbar stenoisi, DDD and S1 impingement.  STates bending forward bothers his back the most.    Currently in Pain?  No/denies         [facility] PT Assessment - 04/02/19 0001      Assessment   Medical Diagnosis  LBP and BLE weakness    Referring Provider (PT)  Estill Bamberg Md      Precautions   Precaution Comments  6                   [facility] Adult PT Treatment/Exercise - 04/02/19 0001       Knee/Hip Exercises: Stretches   Active Hamstring Stretch  Right;Left;2 reps;20 seconds    Active Hamstring Stretch Limitations  instructed long sitting and supine with towel    Other Knee/Hip Stretches  knee to chest 2X20" each      Knee/Hip Exercises: Supine   Bridges  Both;2 sets;10 reps    Bridges Limitations  with core stab    Straight Leg Raises  Both;10 reps    Straight Leg Raises Limitations  with core stab and knee extended      Knee/Hip Exercises: Sidelying   Clams  10 reps each with 5" holds      Knee/Hip Exercises: Prone   Hip Extension  Both;10 reps    Hip Extension Limitations  with stab, Rt LE weakner             PT Education - 04/02/19 1503    Education Details  differnece between abdominal bracing and active mm contractions.  Instructions on breathing properly with exercises.  Review of goals, HEP and POC moving forward.    Person(s) Educated  Patient    Methods  Explanation;Demonstration;Tactile cues;Verbal cues  Comprehension  Verbalized understanding;Returned demonstration;Verbal cues required;Tactile cues required;Need further instruction       PT Short Term Goals - 03/30/19 1533      PT SHORT TERM GOAL #1   Title  Patient will be independent with initial HEP to improve functional outcomes    Time  2    Period  Weeks    Status  New    Target Date  04/17/19        PT Long Term Goals - 03/30/19 1533      PT LONG TERM GOAL #1   Title  Patient will improve FOTO score to <35%  to indicate improvement in functional outcomes    Time  4    Period  Weeks    Status  New    Target Date  05/01/19      PT LONG TERM GOAL #2   Title  Patient will be able to maintain tandem stance >30 seconds on BLEs to improve stability and reduce risk for falls    Time  4    Period  Weeks    Status  New    Target Date  05/01/19      PT LONG TERM GOAL #3   Title  Patient will report at least 75% overall improvement in subjective complaint to indicate  improvement in ability to perform ADLs.    Time  4    Period  Weeks    Status  New    Target Date  05/01/19      PT LONG TERM GOAL #4   Title  Pateint will be able to stand greater than 1 hour during functional activity like yard work or playing golf    Time  4    Period  Weeks    Status  New    Target Date  05/01/19      PT LONG TERM GOAL #5   Title  Pateitn will be IND with final HEP    Time  4    Period  Weeks    Status  New    Target Date  05/01/19            Plan - 04/02/19 1555    Clinical Impression Statement  Reviewed HEP, goals and POC moving foward.  Noted pt tends to complete exercises in ballistic motions and also demonstrated that he doe bicycle and full sit ups regularly too.  Pt demonstrated these poorly with excessive stress and strain.  Recommended he stop these at this time and only complete what is given by therapist. Explained core stability concept to pateint and instructed with abdominal bracing and slower, controlled exercises and breathing techniques.  PT able to demonstrate correctly with practice and reported these being more challenging and "feel these" at end of session.  Instructed with hamstring stretches as well as these mm are extremely tight.  Attempted piriformis in sitting, however too tight ot complete; will try supine.    Personal Factors and Comorbidities  Age    Examination-Activity Limitations  Bend;Squat;Stand;Transfers;Locomotion Level;Lift    Examination-Participation Restrictions  Community Activity;Yard Work    Stability/Clinical Decision Making  Stable/Uncomplicated    Rehab Potential  Good    PT Frequency  2x / week    PT Duration  4 weeks    PT Treatment/Interventions  ADLs/Self Care Home Management;Balance training;Therapeutic exercise;Manual techniques;Therapeutic activities;Stair training;Gait training;Neuromuscular re-education;Patient/family education;Taping;Energy conservation;Joint Manipulations;Spinal Manipulations;Passive  range of motion;Functional mobility training;Traction    PT Next Visit Plan  Contiue  with core stabiltiy exercises beginning with mat activities progressing to sit to stands, sidestepping, add balance activity (tandem stance) as tolerated.  Next session add supine piriformis stretch    PT Home Exercise Plan  03/30/19: POE    Consulted and Agree with Plan of Care  Patient       Patient will benefit from skilled therapeutic intervention in order to improve the following deficits and impairments:  Decreased endurance, Decreased activity tolerance, Decreased strength, Pain, Decreased balance, Decreased mobility, Difficulty walking, Postural dysfunction  Visit Diagnosis: Chronic bilateral low back pain without sciatica  Muscle weakness (generalized)     Problem List There are no problems to display for this patient. Lurena Nida, PTA/CLT [phone]   Lurena Nida 04/02/2019, 4:09 PM  [facility] [address] [place], [place], [address] Phone: [phone]   Fax:  [phone]  Name: Mykah Shin MRN: [id number] Date of Birth: [birth year]

## 2019-04-07 ENCOUNTER — Ambulatory Visit (HOSPITAL_COMMUNITY): Payer: Medicare Other

## 2019-04-07 ENCOUNTER — Encounter (HOSPITAL_COMMUNITY): Payer: Self-pay

## 2019-04-07 ENCOUNTER — Telehealth (HOSPITAL_COMMUNITY): Payer: Self-pay

## 2019-04-07 ENCOUNTER — Other Ambulatory Visit: Payer: Self-pay

## 2019-04-07 ENCOUNTER — Ambulatory Visit: Payer: Medicare Other

## 2019-04-07 DIAGNOSIS — M545 Low back pain, unspecified: Secondary | ICD-10-CM

## 2019-04-07 DIAGNOSIS — G8929 Other chronic pain: Secondary | ICD-10-CM

## 2019-04-07 DIAGNOSIS — M6281 Muscle weakness (generalized): Secondary | ICD-10-CM

## 2019-04-07 NOTE — Therapy (Signed)
[facility] [address] [place], [place], [address] Phone: [phone]   Fax:  [phone]  Physical Therapy Treatment  Patient Details  Name: Edward Campos MRN: [id number] Date of Birth: 1932/12/21 Referring Provider (PT): Estill Bamberg Md   Encounter Date: 04/07/2019  PT End of Session - 04/07/19 1457    Visit Number  3    Number of Visits  8    Date for PT Re-Evaluation  05/01/19    Authorization Type  Medicare (80/20) 60 Visit Limit    Authorization Time Period  03/30/19-05/01/19    PT Start Time  1450    PT Stop Time  1528    PT Time Calculation (min)  38 min    Activity Tolerance  Patient tolerated treatment well    Behavior During Therapy  [facility] for tasks assessed/performed       Past Medical History:  Diagnosis Date  . AAA (abdominal aortic aneurysm) (HCC)   . Arthritis   . BPH (benign prostatic hyperplasia)   . Dizziness   . Hyperlipidemia     Past Surgical History:  Procedure Laterality Date  . growth removed from chest    . HEMORRHOID SURGERY    . HERNIA REPAIR  12 yrs ago   left inguinal hernia  . INGUINAL HERNIA REPAIR  10/16/2010   Procedure: HERNIA REPAIR INGUINAL ADULT;  Surgeon: Dalia Heading;  Location: [facility];  Service: General;  Laterality: Right;    There were no vitals filed for this visit.  Subjective Assessment - 04/07/19 1454    Subjective  Pt reports balance is off in the morning as well as knee pain.  Reports improved activity tolerance and balance improves through out the day.  No reoprts of recent fall, no pain today.    Patient Stated Goals  Improve strength to prepare for possible upcoming surgery, want to be able to go out, qwalk and play golf.    Currently in Pain?  No/denies                       [facility] Adult PT Treatment/Exercise - 04/07/19 0001      Knee/Hip Exercises: Stretches   Active Hamstring Stretch  Right;Left;2 reps;20 seconds    Active Hamstring Stretch  Limitations  supine hands behind knee    Piriformis Stretch  Both;2 reps;30 seconds    Piriformis Stretch Limitations  90/90 supine      Knee/Hip Exercises: Standing   Other Standing Knee Exercises  sidestep with RTB 2RT    Other Standing Knee Exercises  tandem stance 3x 30" intermittent HHA      Knee/Hip Exercises: Seated   Sit to Sand  10 reps;without UE support   eccentric control     Knee/Hip Exercises: Supine   Bridges  15 reps    Bridges Limitations  with core stab      Knee/Hip Exercises: Sidelying   Clams  RTB 10x 5" holds BLE               PT Short Term Goals - 03/30/19 1533      PT SHORT TERM GOAL #1   Title  Patient will be independent with initial HEP to improve functional outcomes    Time  2    Period  Weeks    Status  New    Target Date  04/17/19        PT Long Term Goals - 03/30/19 1533  PT LONG TERM GOAL #1   Title  Patient will improve FOTO score to <35%  to indicate improvement in functional outcomes    Time  4    Period  Weeks    Status  New    Target Date  05/01/19      PT LONG TERM GOAL #2   Title  Patient will be able to maintain tandem stance >30 seconds on BLEs to improve stability and reduce risk for falls    Time  4    Period  Weeks    Status  New    Target Date  05/01/19      PT LONG TERM GOAL #3   Title  Patient will report at least 75% overall improvement in subjective complaint to indicate improvement in ability to perform ADLs.    Time  4    Period  Weeks    Status  New    Target Date  05/01/19      PT LONG TERM GOAL #4   Title  Pateint will be able to stand greater than 1 hour during functional activity like yard work or playing golf    Time  4    Period  Weeks    Status  New    Target Date  05/01/19      PT LONG TERM GOAL #5   Title  Pateitn will be IND with final HEP    Time  4    Period  Weeks    Status  New    Target Date  05/01/19            Plan - 04/07/19 1723    Clinical Impression  Statement  Added piriformis stretch in supine mobility, pt tight hip musculature and some difficulty getting positions.  Pt educated on difference of a pulling stretch vs pain with new stretch.  Able to add theraband resistance with clam exercises with good form.  Progressed exercises with additional exercises for gluteal strengthening and balance.  Pt required cueing for posture during sidestep and tandem stance to improve balance, min A for safety.  No reports of pain through session, was limited by fatigue.    Personal Factors and Comorbidities  Age    Examination-Activity Limitations  Bend;Squat;Stand;Transfers;Locomotion Level;Lift    Examination-Participation Restrictions  Community Activity;Yard Work    Stability/Clinical Decision Making  Stable/Uncomplicated    Clinical Decision Making  Low    Rehab Potential  Good    PT Frequency  2x / week    PT Duration  4 weeks    PT Treatment/Interventions  ADLs/Self Care Home Management;Balance training;Therapeutic exercise;Manual techniques;Therapeutic activities;Stair training;Gait training;Neuromuscular re-education;Patient/family education;Taping;Energy conservation;Joint Manipulations;Spinal Manipulations;Passive range of motion;Functional mobility training;Traction    PT Next Visit Plan  Contiue with core stabiltiy exercises beginning with mat activities progressing to sit to stands, sidestepping, add balance activity (tandem stance) as tolerated.  Continue supine piriformis stretch    PT Home Exercise Plan  03/30/19: POE       Patient will benefit from skilled therapeutic intervention in order to improve the following deficits and impairments:  Decreased endurance, Decreased activity tolerance, Decreased strength, Pain, Decreased balance, Decreased mobility, Difficulty walking, Postural dysfunction  Visit Diagnosis: Muscle weakness (generalized)  Chronic bilateral low back pain without sciatica     Problem List There are no problems to  display for this patient.  [address] LPTA; CBIS [phone]  Juel Burrow 04/07/2019, 5:32 PM  [facility]  [address] [place], [place], [address] Phone: [phone]   Fax:  [phone]  Name: Edward Campos MRN: [id number] Date of Birth: 10-05-1932

## 2019-04-07 NOTE — Telephone Encounter (Signed)
Pt requested to have all My Chart, and Email notifications stop. He only wants to be contacted by [place] and his home address.

## 2019-04-09 ENCOUNTER — Ambulatory Visit (HOSPITAL_COMMUNITY): Payer: Medicare Other | Admitting: Physical Therapy

## 2019-04-09 ENCOUNTER — Encounter (HOSPITAL_COMMUNITY): Payer: Self-pay | Admitting: Physical Therapy

## 2019-04-09 ENCOUNTER — Other Ambulatory Visit: Payer: Self-pay

## 2019-04-09 DIAGNOSIS — M545 Low back pain: Secondary | ICD-10-CM | POA: Diagnosis not present

## 2019-04-09 DIAGNOSIS — G8929 Other chronic pain: Secondary | ICD-10-CM

## 2019-04-09 DIAGNOSIS — M6281 Muscle weakness (generalized): Secondary | ICD-10-CM

## 2019-04-09 NOTE — Therapy (Signed)
[facility] [address] [place], [place], [address] Phone: [phone]   Fax:  [phone]  Physical Therapy Treatment  Patient Details  Name: Edward Campos MRN: [id number] Date of Birth: 08-09-1932 Referring Provider (PT): Phylliss Bob Md   Encounter Date: 04/09/2019  PT End of Session - 04/09/19 1525    Visit Number  4    Number of Visits  8    Date for PT Re-Evaluation  05/01/19    Authorization Type  Medicare (80/20) 60 Visit Limit    Authorization Time Period  03/30/19-05/01/19    PT Start Time  1515    PT Stop Time  1600    PT Time Calculation (min)  45 min    Activity Tolerance  Patient tolerated treatment well    Behavior During Therapy  [facility] for tasks assessed/performed       Past Medical History:  Diagnosis Date  . AAA (abdominal aortic aneurysm) ([facility])   . Arthritis   . BPH (benign prostatic hyperplasia)   . Dizziness   . Hyperlipidemia     Past Surgical History:  Procedure Laterality Date  . growth removed from chest    . HEMORRHOID SURGERY    . HERNIA REPAIR  12 yrs ago   left inguinal hernia  . INGUINAL HERNIA REPAIR  10/16/2010   Procedure: HERNIA REPAIR INGUINAL ADULT;  Surgeon: Jamesetta So;  Location: [facility];  Service: General;  Laterality: Right;    There were no vitals filed for this visit.  Subjective Assessment - 04/09/19 1523    Subjective  Patient says his LT anterior hip/ groin has been hurting today. Patient says he felt good after last session and has been doing exercises at home with no problem. Says he walked out to his mailbox this AM and noted increased front hip pain.    Patient Stated Goals  Improve strength to prepare for possible upcoming surgery, want to be able to go out, walk and play golf.    Currently in Pain?  Yes    Pain Score  4     Pain Location  Hip    Pain Orientation  Left;Anterior    Pain Descriptors / Indicators  Aching;Nagging    Pain Type  Acute pain    Pain Onset   Today    Pain Frequency  Intermittent                       [facility] Adult PT Treatment/Exercise - 04/09/19 0001      Exercises   Exercises  Lumbar      Lumbar Exercises: Stretches   Piriformis Stretch  3 reps;30 seconds      Lumbar Exercises: Supine   Ab Set  10 reps    Other Supine Lumbar Exercises  ab marching alternating x20      Knee/Hip Exercises: Stretches   Hip Flexor Stretch  Both;5 reps;10 seconds    Hip Flexor Stretch Limitations  lead foot on 6 inch box       Knee/Hip Exercises: Standing   Heel Raises  20 reps    Other Standing Knee Exercises  sidestep with RTB 2RT, Palloff press, red, x10 each     Other Standing Knee Exercises  tandem stance 3x 30" intermittent HHA      Knee/Hip Exercises: Seated   Sit to Sand  10 reps;without UE support      Knee/Hip Exercises: Supine   Bridges  15 reps  PT Short Term Goals - 03/30/19 1533      PT SHORT TERM GOAL #1   Title  Patient will be independent with initial HEP to improve functional outcomes    Time  2    Period  Weeks    Status  New    Target Date  04/17/19        PT Long Term Goals - 03/30/19 1533      PT LONG TERM GOAL #1   Title  Patient will improve FOTO score to <35%  to indicate improvement in functional outcomes    Time  4    Period  Weeks    Status  New    Target Date  05/01/19      PT LONG TERM GOAL #2   Title  Patient will be able to maintain tandem stance >30 seconds on BLEs to improve stability and reduce risk for falls    Time  4    Period  Weeks    Status  New    Target Date  05/01/19      PT LONG TERM GOAL #3   Title  Patient will report at least 75% overall improvement in subjective complaint to indicate improvement in ability to perform ADLs.    Time  4    Period  Weeks    Status  New    Target Date  05/01/19      PT LONG TERM GOAL #4   Title  Pateint will be able to stand greater than 1 hour during functional activity like yard work or  playing golf    Time  4    Period  Weeks    Status  New    Target Date  05/01/19      PT LONG TERM GOAL #5   Title  Pateitn will be IND with final HEP    Time  4    Period  Weeks    Status  New    Target Date  05/01/19            Plan - 04/09/19 1638    Clinical Impression Statement  Patient tolerated ther ex progressions well. Added hip flexor stretch on step to address complaint of LT anterior hip pain. Patient noted some improvement in sx. Patient required verbal and tactile cueing for proper form and leg placement during added abdominal marching. Patient also cued on breathing technique and to avoid holding breath. Patient noted decreased anterior hip pain post treatment, but did note increased muscle fatigue/ soreness at lateral LT hip.    Personal Factors and Comorbidities  Age    Examination-Activity Limitations  Bend;Squat;Stand;Transfers;Locomotion Level;Lift    Examination-Participation Restrictions  Community Activity;Yard Work    Stability/Clinical Decision Making  Stable/Uncomplicated    Rehab Potential  Good    PT Frequency  2x / week    PT Duration  4 weeks    PT Treatment/Interventions  ADLs/Self Care Home Management;Balance training;Therapeutic exercise;Manual techniques;Therapeutic activities;Stair training;Gait training;Neuromuscular re-education;Patient/family education;Taping;Energy conservation;Joint Manipulations;Spinal Manipulations;Passive range of motion;Functional mobility training;Traction    PT Next Visit Plan  Continue to progress hip and core strengthening, and conditioning as tolerated. Add tandem gait next visit .    PT Home Exercise Plan  03/30/19: POE       Patient will benefit from skilled therapeutic intervention in order to improve the following deficits and impairments:  Decreased endurance, Decreased activity tolerance, Decreased strength, Pain, Decreased balance, Decreased mobility, Difficulty walking, Postural dysfunction  Visit  Diagnosis: Chronic bilateral low back pain without sciatica  Muscle weakness (generalized)     Problem List There are no problems to display for this patient.  4:47 PM, 04/09/19 Georges Lynch PT DPT  Physical Therapist with [facility]  [facility]  [phone]   [facility] [address] [place], [place], [address] Phone: [phone]   Fax:  [phone]  Name: Edward Campos MRN: [id number] Date of Birth: 03/25/1932

## 2019-04-13 ENCOUNTER — Ambulatory Visit (HOSPITAL_COMMUNITY): Payer: Medicare Other | Attending: Orthopedic Surgery

## 2019-04-13 ENCOUNTER — Encounter (HOSPITAL_COMMUNITY): Payer: Self-pay

## 2019-04-13 ENCOUNTER — Other Ambulatory Visit: Payer: Self-pay

## 2019-04-13 DIAGNOSIS — M545 Low back pain: Secondary | ICD-10-CM | POA: Diagnosis present

## 2019-04-13 DIAGNOSIS — G8929 Other chronic pain: Secondary | ICD-10-CM | POA: Diagnosis present

## 2019-04-13 DIAGNOSIS — M6281 Muscle weakness (generalized): Secondary | ICD-10-CM | POA: Diagnosis present

## 2019-04-13 NOTE — Therapy (Signed)
[facility] [address] [place], [place], [address] Phone: [phone]   Fax:  [phone]  Physical Therapy Treatment  Patient Details  Name: Edward Campos MRN: [id number] Date of Birth: October 03, 1932 Referring Provider (PT): Estill Bamberg Md   Encounter Date: 04/13/2019  PT End of Session - 04/13/19 1313    Visit Number  5    Number of Visits  8    Date for PT Re-Evaluation  05/01/19    Authorization Type  Medicare (80/20) 60 Visit Limit    Authorization Time Period  03/30/19-05/01/19    PT Start Time  1315    PT Stop Time  1359    PT Time Calculation (min)  44 min    Equipment Utilized During Treatment  Gait belt    Activity Tolerance  Patient tolerated treatment well    Behavior During Therapy  [facility] for tasks assessed/performed       Past Medical History:  Diagnosis Date  . AAA (abdominal aortic aneurysm) ([facility])   . Arthritis   . BPH (benign prostatic hyperplasia)   . Dizziness   . Hyperlipidemia     Past Surgical History:  Procedure Laterality Date  . growth removed from chest    . HEMORRHOID SURGERY    . HERNIA REPAIR  12 yrs ago   left inguinal hernia  . INGUINAL HERNIA REPAIR  10/16/2010   Procedure: HERNIA REPAIR INGUINAL ADULT;  Surgeon: Dalia Heading;  Location: [facility];  Service: General;  Laterality: Right;    There were no vitals filed for this visit.  Subjective Assessment - 04/13/19 1313    Subjective  Pt reports last session went great, felt like exercise and stretching went well. Pt reports performing exercises daily with soreness in the morning that improves during the day.    Limitations  Standing;Walking;House hold activities;Lifting    How long can you stand comfortably?  No problem, only when bending    How long can you walk comfortably?  100 yards    Patient Stated Goals  Improve strength to prepare for possible upcoming surgery, want to be able to go out, walk and play golf.    Currently in Pain?  Other  (Comment)   "old folks pains, I guess"   Pain Onset  Today    Pain Frequency  Intermittent          [facility] Adult PT Treatment/Exercise - 04/13/19 0001      Lumbar Exercises: Standing   Other Standing Lumbar Exercises  chops, GTB, x10 reps each direction      Lumbar Exercises: Supine   Ab Set  20 reps    AB Set Limitations  with exhale    Bent Knee Raise  20 reps    Bent Knee Raise Limitations  alternating BLE marching    Bridge  10 reps      Knee/Hip Exercises: Stretches   Active Hamstring Stretch  Both;30 seconds    Active Hamstring Stretch Limitations  12" step    Gastroc Stretch  Both;30 seconds    Gastroc Stretch Limitations  slant board      Knee/Hip Exercises: Standing   Heel Raises  20 reps    Forward Step Up  Both;10 reps;Step Height: 6"    Wall Squat  5 sets;5 seconds    Wall Squat Limitations  wall sit    Other Standing Knee Exercises  sidestep blue line, x2RT; paloff press, GTB, x10 reps each direction; lat pull down/shoulder  extension, GTB, x10 reps with core engaged    Other Standing Knee Exercises  tandem gait on blue line, x2RT, unable to do heel-toe             PT Education - 04/13/19 1313    Education Details  Exercise technique, continue HEP    Person(s) Educated  Patient    Methods  Explanation    Comprehension  Verbalized understanding       PT Short Term Goals - 04/13/19 1314      PT SHORT TERM GOAL #1   Title  Patient will be independent with initial HEP to improve functional outcomes    Time  2    Period  Weeks    Status  On-going    Target Date  04/17/19        PT Long Term Goals - 04/13/19 1314      PT LONG TERM GOAL #1   Title  Patient will improve FOTO score to <35%  to indicate improvement in functional outcomes    Time  4    Period  Weeks    Status  On-going      PT LONG TERM GOAL #2   Title  Patient will be able to maintain tandem stance >30 seconds on BLEs to improve stability and reduce risk for falls    Time  4     Period  Weeks    Status  On-going      PT LONG TERM GOAL #3   Title  Patient will report at least 75% overall improvement in subjective complaint to indicate improvement in ability to perform ADLs.    Time  4    Period  Weeks    Status  On-going      PT LONG TERM GOAL #4   Title  Pateint will be able to stand greater than 1 hour during functional activity like yard work or playing golf    Time  4    Period  Weeks    Status  On-going      PT LONG TERM GOAL #5   Title  Pateitn will be IND with final HEP    Time  4    Period  Weeks    Status  On-going            Plan - 04/13/19 1314    Clinical Impression Statement  Began treatment with supine core activation, pt required constant verbal cues to improve performance and mind-body connection. Added step ups for BLE strengthening in functional position with slight increase in back pain that resolved with rest. Pt unable to perform tandem gait on blue line, but able to maintain bil feet on blue line not in heel-toe pattern with stepping off 25% of the time with protective step technique to prevent fall. Pt with good core activation with lat pull downs, paloff press and chops with intermittent verbal cues. Pt denies pain at EOS. Continue to progress as able.    Personal Factors and Comorbidities  Age    Examination-Activity Limitations  Bend;Squat;Stand;Transfers;Locomotion Level;Lift    Examination-Participation Restrictions  Community Activity;Yard Work    Stability/Clinical Decision Making  Stable/Uncomplicated    Rehab Potential  Good    PT Frequency  2x / week    PT Duration  4 weeks    PT Treatment/Interventions  ADLs/Self Care Home Management;Balance training;Therapeutic exercise;Manual techniques;Therapeutic activities;Stair training;Gait training;Neuromuscular re-education;Patient/family education;Taping;Energy conservation;Joint Manipulations;Spinal Manipulations;Passive range of motion;Functional mobility training;Traction     PT Next  Visit Plan  Progress hip and core strengthening, add balance exercises.    PT Home Exercise Plan  03/30/19: POE    Consulted and Agree with Plan of Care  Patient       Patient will benefit from skilled therapeutic intervention in order to improve the following deficits and impairments:  Decreased endurance, Decreased activity tolerance, Decreased strength, Pain, Decreased balance, Decreased mobility, Difficulty walking, Postural dysfunction  Visit Diagnosis: Chronic bilateral low back pain without sciatica  Muscle weakness (generalized)     Problem List There are no problems to display for this patient.    Talbot Grumbling PT, DPT 04/13/19, 2:04 PM [place], [place], [address] Phone: [phone]   Fax:  [phone]  Name: Ulrick Methot MRN: [id number] Date of Birth: Jan 04, 1933

## 2019-04-16 ENCOUNTER — Other Ambulatory Visit: Payer: Self-pay

## 2019-04-16 ENCOUNTER — Ambulatory Visit: Payer: Medicare Other | Attending: Internal Medicine

## 2019-04-16 ENCOUNTER — Ambulatory Visit (HOSPITAL_COMMUNITY): Payer: Medicare Other | Admitting: Physical Therapy

## 2019-04-16 ENCOUNTER — Encounter (HOSPITAL_COMMUNITY): Payer: Self-pay | Admitting: Physical Therapy

## 2019-04-16 DIAGNOSIS — M6281 Muscle weakness (generalized): Secondary | ICD-10-CM

## 2019-04-16 DIAGNOSIS — M545 Low back pain: Secondary | ICD-10-CM | POA: Diagnosis not present

## 2019-04-16 DIAGNOSIS — G8929 Other chronic pain: Secondary | ICD-10-CM

## 2019-04-16 NOTE — Therapy (Signed)
[facility] [address] [place], [place], [address] Phone: [phone]   Fax:  [phone]  Physical Therapy Treatment  Patient Details  Name: Edward Campos MRN: [id number] Date of Birth: [birth year] Referring Provider (PT): Estill Bamberg Md   Encounter Date: 04/16/2019  PT End of Session - 04/16/19 1350    Visit Number  6    Number of Visits  8    Date for PT Re-Evaluation  05/01/19    Authorization Type  Medicare (80/20) 60 Visit Limit    Authorization Time Period  03/30/19-05/01/19    Authorization - Visit Number  6    Authorization - Number of Visits  10    PT Start Time  1347    PT Stop Time  1430    PT Time Calculation (min)  43 min    Equipment Utilized During Treatment  Gait belt    Activity Tolerance  Patient tolerated treatment well    Behavior During Therapy  [facility] for tasks assessed/performed       Past Medical History:  Diagnosis Date  . AAA (abdominal aortic aneurysm) ([facility])   . Arthritis   . BPH (benign prostatic hyperplasia)   . Dizziness   . Hyperlipidemia     Past Surgical History:  Procedure Laterality Date  . growth removed from chest    . HEMORRHOID SURGERY    . HERNIA REPAIR  12 yrs ago   left inguinal hernia  . INGUINAL HERNIA REPAIR  10/16/2010   Procedure: HERNIA REPAIR INGUINAL ADULT;  Surgeon: Dalia Heading;  Location: [facility];  Service: General;  Laterality: Right;    There were no vitals filed for this visit.  Subjective Assessment - 04/16/19 1351    Subjective  Patient says he was sore after last visit. Says exercises were pretty tough. Says his hips and back are sore today.    Limitations  Standing;Walking;House hold activities;Lifting    How long can you stand comfortably?  No problem, only when bending    How long can you walk comfortably?  100 yards    Patient Stated Goals  Improve strength to prepare for possible upcoming surgery, want to be able to go out, walk and play golf.    Currently  in Pain?  Yes    Pain Score  6     Pain Location  Back    Pain Descriptors / Indicators  Sore    Pain Type  Acute pain    Pain Onset  In the past 7 days                       [facility] Adult PT Treatment/Exercise - 04/16/19 0001      Lumbar Exercises: Stretches   Lower Trunk Rotation  5 reps;10 seconds      Knee/Hip Exercises: Stretches   Active Hamstring Stretch  Both;2 reps;30 seconds    Active Hamstring Stretch Limitations  12" step    Hip Flexor Stretch  Both;5 reps;10 seconds    Hip Flexor Stretch Limitations  lead foot on 6 inch box     Piriformis Stretch  Both;3 reps;30 seconds    Gastroc Stretch  Both;30 seconds    Gastroc Stretch Limitations  slant board      Knee/Hip Exercises: Standing   Heel Raises  20 reps    Forward Step Up  Both;Step Height: 6";15 reps    Other Standing Knee Exercises  sidestep with RTB 3RT, Palloff  press, red, x15 each     Other Standing Knee Exercises  tandem gait, 15" 2 RT       Knee/Hip Exercises: Seated   Sit to Sand  10 reps;without UE support               PT Short Term Goals - 04/13/19 1314      PT SHORT TERM GOAL #1   Title  Patient will be independent with initial HEP to improve functional outcomes    Time  2    Period  Weeks    Status  On-going    Target Date  04/17/19        PT Long Term Goals - 04/13/19 1314      PT LONG TERM GOAL #1   Title  Patient will improve FOTO score to <35%  to indicate improvement in functional outcomes    Time  4    Period  Weeks    Status  On-going      PT LONG TERM GOAL #2   Title  Patient will be able to maintain tandem stance >30 seconds on BLEs to improve stability and reduce risk for falls    Time  4    Period  Weeks    Status  On-going      PT LONG TERM GOAL #3   Title  Patient will report at least 75% overall improvement in subjective complaint to indicate improvement in ability to perform ADLs.    Time  4    Period  Weeks    Status  On-going      PT  LONG TERM GOAL #4   Title  Pateint will be able to stand greater than 1 hour during functional activity like yard work or playing golf    Time  4    Period  Weeks    Status  On-going      PT LONG TERM GOAL #5   Title  Pateitn will be IND with final HEP    Time  4    Period  Weeks    Status  On-going            Plan - 04/16/19 1432    Clinical Impression Statement  Activity graded today to patient tolerance per subjective of increased soreness with increased activity last session. Patient required frequent cueing for number of reps and hold times with stretching. Patient noted increased strain on lumbar with palloff press, double band, reduced to single band, patient noted improvement. Patient showed good stability with tandem gait today. Patient required cause for sequencing for step ups on 6 inch box. Patient reported ongoing stiffness in anterior hips, instructed patient on hip flexor stretching as discussed in previous visit. Patient unsure if stretch was helpful for primary complaint, noted increased aching in opposite quad. Had some difficulty discerning from patient what was stretching, what was pain etc. per his subjective reports.    Personal Factors and Comorbidities  Age    Examination-Activity Limitations  Bend;Squat;Stand;Transfers;Locomotion Level;Lift    Examination-Participation Restrictions  Community Activity;Yard Work    Stability/Clinical Decision Making  Stable/Uncomplicated    Rehab Potential  Good    PT Frequency  2x / week    PT Duration  4 weeks    PT Treatment/Interventions  ADLs/Self Care Home Management;Balance training;Therapeutic exercise;Manual techniques;Therapeutic activities;Stair training;Gait training;Neuromuscular re-education;Patient/family education;Taping;Energy conservation;Joint Manipulations;Spinal Manipulations;Passive range of motion;Functional mobility training;Traction    PT Next Visit Plan  Progress hip and core strengthening, add balance  exercises.    PT Home Exercise Plan  03/30/19: POE    Consulted and Agree with Plan of Care  Patient       Patient will benefit from skilled therapeutic intervention in order to improve the following deficits and impairments:  Decreased endurance, Decreased activity tolerance, Decreased strength, Pain, Decreased balance, Decreased mobility, Difficulty walking, Postural dysfunction  Visit Diagnosis: Muscle weakness (generalized)  Chronic bilateral low back pain without sciatica     Problem List There are no problems to display for this patient.  2:33 PM, 04/16/19 Georges Lynch PT DPT  Physical Therapist with [facility]  [facility]  [phone]   [facility] [address] [place], [place], [address] Phone: [phone]   Fax:  [phone]  Name: Brek Reece MRN: [id number] Date of Birth: Jan 19, 1933

## 2019-04-18 ENCOUNTER — Ambulatory Visit: Payer: Medicare Other

## 2019-04-20 ENCOUNTER — Other Ambulatory Visit: Payer: Self-pay

## 2019-04-20 ENCOUNTER — Ambulatory Visit (HOSPITAL_COMMUNITY): Payer: Medicare Other | Admitting: Physical Therapy

## 2019-04-20 ENCOUNTER — Encounter (HOSPITAL_COMMUNITY): Payer: Self-pay | Admitting: Physical Therapy

## 2019-04-20 DIAGNOSIS — M545 Low back pain, unspecified: Secondary | ICD-10-CM

## 2019-04-20 DIAGNOSIS — G8929 Other chronic pain: Secondary | ICD-10-CM

## 2019-04-20 DIAGNOSIS — M6281 Muscle weakness (generalized): Secondary | ICD-10-CM

## 2019-04-20 NOTE — Therapy (Signed)
[facility] [address] [place], [place], [address] Phone: [phone]   Fax:  [phone]  Physical Therapy Treatment  Patient Details  Name: Edward Campos MRN: [id number] Date of Birth: Aug 05, 1932 Referring Provider (PT): Estill Bamberg Md   Encounter Date: 04/20/2019  PT End of Session - 04/20/19 1437    Visit Number  7    Number of Visits  8    Date for PT Re-Evaluation  05/01/19    Authorization Type  Medicare (80/20) 60 Visit Limit    Authorization Time Period  03/30/19-05/01/19    Authorization - Visit Number  7    Authorization - Number of Visits  10    PT Start Time  1435    PT Stop Time  1515    PT Time Calculation (min)  40 min    Equipment Utilized During Treatment  Gait belt    Activity Tolerance  Patient tolerated treatment well    Behavior During Therapy  [facility] for tasks assessed/performed       Past Medical History:  Diagnosis Date  . AAA (abdominal aortic aneurysm) (HCC)   . Arthritis   . BPH (benign prostatic hyperplasia)   . Dizziness   . Hyperlipidemia     Past Surgical History:  Procedure Laterality Date  . growth removed from chest    . HEMORRHOID SURGERY    . HERNIA REPAIR  12 yrs ago   left inguinal hernia  . INGUINAL HERNIA REPAIR  10/16/2010   Procedure: HERNIA REPAIR INGUINAL ADULT;  Surgeon: Dalia Heading;  Location: [facility];  Service: General;  Laterality: Right;    There were no vitals filed for this visit.  Subjective Assessment - 04/20/19 1441    Subjective  Patient says he is doing well today. Says he had a good weekend but had some trouble with repeated bending while putting on Friday. Says once he stood still to swing club he was fine. Says he walked in his yard this weekend and had trouble with his back and discomfort in legs while walking less than 100 yard. Says he feels good today, less sore than last visit. Denies pain currently.    Limitations  Standing;Walking;House hold  activities;Lifting    How long can you stand comfortably?  No problem, only when bending    How long can you walk comfortably?  100 yards    Patient Stated Goals  Improve strength to prepare for possible upcoming surgery, want to be able to go out, walk and play golf.    Currently in Pain?  No/denies    Pain Onset  In the past 7 days                       [facility] Adult PT Treatment/Exercise - 04/20/19 0001      Lumbar Exercises: Stretches   Single Knee to Chest Stretch  Right;Left;5 reps;10 seconds    Lower Trunk Rotation  5 reps;10 seconds      Knee/Hip Exercises: Stretches   Active Hamstring Stretch  Both;5 reps;10 seconds    Active Hamstring Stretch Limitations  12" step    Piriformis Stretch  Both;3 reps;30 seconds    Gastroc Stretch  Both;30 seconds    Gastroc Stretch Limitations  slant board      Knee/Hip Exercises: Print production planner walkout 30# x10      Knee/Hip Exercises: Standing   Heel Raises  20  reps    Stairs  3RT, 4 in, single rail, reciprocal     Other Standing Knee Exercises  sidestep with RTB 3RT, Palloff press, red, x15 each     Other Standing Knee Exercises  tandem stance, solid floor, 3 x 20"; tandem gait, 15" 2 RT       Knee/Hip Exercises: Seated   Sit to Sand  10 reps;without UE support               PT Short Term Goals - 04/13/19 1314      PT SHORT TERM GOAL #1   Title  Patient will be independent with initial HEP to improve functional outcomes    Time  2    Period  Weeks    Status  On-going    Target Date  04/17/19        PT Long Term Goals - 04/13/19 1314      PT LONG TERM GOAL #1   Title  Patient will improve FOTO score to <35%  to indicate improvement in functional outcomes    Time  4    Period  Weeks    Status  On-going      PT LONG TERM GOAL #2   Title  Patient will be able to maintain tandem stance >30 seconds on BLEs to improve stability and reduce risk for falls    Time  4     Period  Weeks    Status  On-going      PT LONG TERM GOAL #3   Title  Patient will report at least 75% overall improvement in subjective complaint to indicate improvement in ability to perform ADLs.    Time  4    Period  Weeks    Status  On-going      PT LONG TERM GOAL #4   Title  Pateint will be able to stand greater than 1 hour during functional activity like yard work or playing golf    Time  4    Period  Weeks    Status  On-going      PT LONG TERM GOAL #5   Title  Pateitn will be IND with final HEP    Time  4    Period  Weeks    Status  On-going            Plan - 04/20/19 1437    Clinical Impression Statement  Patient with improved tolerance today. Patient continues to require cues for pacing speed of exercise and for hold times during stretching, but with somewhat improved return from last session. Patient noted less discomfort during treatment today, but did note "strain" in lumbar during sit to stands, and palloff press. Patient was able to increase resistance to double band with palloff press with no increased complaint. Patient noted increased muscle fatigue in BLE, and tightness in hamstrings during band sidestepping. Patient educated on proper form and function of added machine walkouts.    Personal Factors and Comorbidities  Age    Examination-Activity Limitations  Bend;Squat;Stand;Transfers;Locomotion Level;Lift    Examination-Participation Restrictions  Community Activity;Yard Work    Stability/Clinical Decision Making  Stable/Uncomplicated    Rehab Potential  Good    PT Frequency  2x / week    PT Duration  4 weeks    PT Treatment/Interventions  ADLs/Self Care Home Management;Balance training;Therapeutic exercise;Manual techniques;Therapeutic activities;Stair training;Gait training;Neuromuscular re-education;Patient/family education;Taping;Energy conservation;Joint Manipulations;Spinal Manipulations;Passive range of motion;Functional mobility training;Traction     PT Next Visit Plan  Progress hip and core strengthening, add balance exercises. Add gait training for endurance.    PT Home Exercise Plan  03/30/19: POE    Consulted and Agree with Plan of Care  Patient       Patient will benefit from skilled therapeutic intervention in order to improve the following deficits and impairments:  Decreased endurance, Decreased activity tolerance, Decreased strength, Pain, Decreased balance, Decreased mobility, Difficulty walking, Postural dysfunction  Visit Diagnosis: Muscle weakness (generalized)  Chronic bilateral low back pain without sciatica     Problem List There are no problems to display for this patient.  4:43 PM, 04/20/19 Georges Lynch PT DPT  Physical Therapist with [facility]  [facility]  [phone]   [facility] [address] [place], [place], [address] Phone: [phone]   Fax:  [phone]  Name: Yanuel Tagg MRN: [id number] Date of Birth: 08-31-1932

## 2019-04-23 ENCOUNTER — Other Ambulatory Visit: Payer: Self-pay

## 2019-04-23 ENCOUNTER — Ambulatory Visit (HOSPITAL_COMMUNITY): Payer: Medicare Other | Admitting: Physical Therapy

## 2019-04-23 ENCOUNTER — Encounter (HOSPITAL_COMMUNITY): Payer: Self-pay | Admitting: Physical Therapy

## 2019-04-23 DIAGNOSIS — G8929 Other chronic pain: Secondary | ICD-10-CM

## 2019-04-23 DIAGNOSIS — M6281 Muscle weakness (generalized): Secondary | ICD-10-CM

## 2019-04-23 DIAGNOSIS — M545 Low back pain: Secondary | ICD-10-CM | POA: Diagnosis not present

## 2019-04-23 NOTE — Patient Instructions (Signed)
Access Code: [email or web address]  URL: [email or web address]  Date: 04/23/2019  Prepared by: Georges Lynch   Exercises Prone Press Up on Elbows - 1 reps - 1 sets - 2-3 inutes hold - 1x daily - 4x weekly Supine Transversus Abdominis Bracing - Hands on Stomach - 10 reps - 2 sets - 5 hold - 1x daily - 4x weekly Supine Bridge - 10 reps - 2 sets - 5 hold - 1x daily - 4x weekly Supine Lower Trunk Rotation - 5 reps - 1 sets - 10 hold - 1x daily - 4x weekly Hooklying Single Knee to Chest Stretch - 5 reps - 1 sets - 10 hold - 1x daily - 4x weekly Sit to Stand with Hands on Knees - 10 reps - 1 sets - 1x daily - 4x weekly Heel rises with counter support - 10 reps - 2 sets - 1x daily - 4x weekly Side Stepping with Resistance at Thighs and Counter Support - 5 reps - 1 sets - 1x daily - 4x weekly Standing Tandem Balance with Counter Support - 3 reps - 30 sets - 1x daily - 4x weekly Gastroc Stretch on Wall - 3 reps - 30 sets - 1x daily - 4x weekly

## 2019-04-23 NOTE — Therapy (Signed)
[facility] [address] [place], [place], [address] Phone: [phone]   Fax:  [phone]  Physical Therapy Treatment/ Discharge Summary  Patient Details  Name: Edward Campos MRN: [id number] Date of Birth: Oct 09, 1932 Referring Provider (PT): Phylliss Bob Md   Encounter Date: 04/23/2019   PHYSICAL THERAPY DISCHARGE SUMMARY  Visits from Start of Care: 8  Current functional level related to goals / functional outcomes: See below   Remaining deficits: See below   Education / Equipment:  See assessment  Plan: Patient agrees to discharge.  Patient goals were partially met. Patient is being discharged due to being pleased with the current functional level.  ?????       PT End of Session - 04/23/19 1316    Visit Number  8    Number of Visits  8    Date for PT Re-Evaluation  05/01/19    Authorization Type  Medicare (80/20) 60 Visit Limit    Authorization Time Period  03/30/19-05/01/19    Authorization - Visit Number  8    Authorization - Number of Visits  10    PT Start Time  7106    PT Stop Time  1345    PT Time Calculation (min)  42 min    Equipment Utilized During Treatment  Gait belt    Activity Tolerance  Patient tolerated treatment well    Behavior During Therapy  WFL for tasks assessed/performed       Past Medical History:  Diagnosis Date  . AAA (abdominal aortic aneurysm) ([place])   . Arthritis   . BPH (benign prostatic hyperplasia)   . Dizziness   . Hyperlipidemia     Past Surgical History:  Procedure Laterality Date  . growth removed from chest    . HEMORRHOID SURGERY    . HERNIA REPAIR  12 yrs ago   left inguinal hernia  . INGUINAL HERNIA REPAIR  10/16/2010   Procedure: HERNIA REPAIR INGUINAL ADULT;  Surgeon: Jamesetta So;  Location: [facility];  Service: General;  Laterality: Right;    There were no vitals filed for this visit.  Subjective Assessment - 04/23/19 1312    Subjective  Patient says he feels  much stronger in legs and is walking better. Patient says he is pleased with his progress. He does note some discomfort with supine piriformis stretching but otherwise says exercises are going well. States he will be having a surgical procedure at the hospital in 2 weeks and would like to make today his last day.    Limitations  Standing;Walking;House hold activities;Lifting    How long can you stand comfortably?  No problem, only when bending    How long can you walk comfortably?  100 yards    Patient Stated Goals  Improve strength to prepare for possible upcoming surgery, want to be able to go out, walk and play golf.    Currently in Pain?  No/denies    Pain Onset  In the past 7 days         [facility] PT Assessment - 04/23/19 0001      Assessment   Medical Diagnosis  LBP and BLE weakness    Referring Provider (PT)  Phylliss Bob Md      Precautions   Precautions  None      Restrictions   Weight Bearing Restrictions  No      Prior Function   Level of Independence  Independent      Cognition  Overall Cognitive Status  Within Functional Limits for tasks assessed      Observation/Other Assessments   Focus on Therapeutic Outcomes (FOTO)   43% limited    was 46%     Static Standing Balance   Static Standing Balance -  Activities   Tandam Stance - Right Leg;Tandam Stance - Left Leg    Static Standing - Comment/# of Minutes  30 sec, 30 sec                           PT Education - 04/23/19 1343    Education Details  Patient educated on progress to therapy gaols and transition to home program for discharge. Patient educated on and issued updated HEP handout.    Person(s) Educated  Patient    Methods  Explanation;Handout    Comprehension  Verbalized understanding       PT Short Term Goals - 04/23/19 1327      PT SHORT TERM GOAL #1   Title  Patient will be independent with initial HEP to improve functional outcomes    Time  2    Period  Weeks    Status   Achieved    Target Date  04/17/19        PT Long Term Goals - 04/23/19 1327      PT LONG TERM GOAL #1   Title  Patient will improve FOTO score to <35%  to indicate improvement in functional outcomes    Baseline  Current: 43% limitation    Time  4    Period  Weeks    Status  Not Met      PT LONG TERM GOAL #2   Title  Patient will be able to maintain tandem stance >30 seconds on BLEs to improve stability and reduce risk for falls    Time  4    Period  Weeks    Status  Achieved      PT LONG TERM GOAL #3   Title  Patient will report at least 75% overall improvement in subjective complaint to indicate improvement in ability to perform ADLs.    Baseline  Current: between 50-75% improved    Time  4    Period  Weeks    Status  Not Met      PT LONG TERM GOAL #4   Title  Pateint will be able to stand greater than 1 hour during functional activity like yard work or playing [place] to do as long as he doesnt have to do repeated bending    Time  4    Period  Weeks    Status  Achieved      PT LONG TERM GOAL #5   Title  Patient will be IND with final HEP    Baseline  Reviewed final HEP, issued updated handout    Time  4    Period  Weeks    Status  Achieved            Plan - 04/23/19 1344    Clinical Impression Statement  Patient has made good progress to LTGs. Patient currently with 1/1 STG and 3/5 LTGs met. Patient still limited by mild pain with certain activity, but overall much improved tolerance to functional activity, improved balance and strength. Patient being DC from therapy today with LTGs partially met to transition to home program. Patient instructed to follow up with therapy services with  any further questions or concerns.    Personal Factors and Comorbidities  Age    Examination-Activity Limitations  Bend;Squat;Stand;Transfers;Locomotion Level;Lift    Examination-Participation Restrictions  Community Activity;Yard Work    Stability/Clinical Decision  Making  Stable/Uncomplicated    Rehab Potential  Good    PT Treatment/Interventions  ADLs/Self Care Home Management;Balance training;Therapeutic exercise;Manual techniques;Therapeutic activities;Stair training;Gait training;Neuromuscular re-education;Patient/family education;Taping;Energy conservation;Joint Manipulations;Spinal Manipulations;Passive range of motion;Functional mobility training;Traction    PT Next Visit Plan  DC    PT Home Exercise Plan  03/30/19: POE    Consulted and Agree with Plan of Care  Patient       Patient will benefit from skilled therapeutic intervention in order to improve the following deficits and impairments:  Decreased endurance, Decreased activity tolerance, Decreased strength, Pain, Decreased balance, Decreased mobility, Difficulty walking, Postural dysfunction  Visit Diagnosis: Muscle weakness (generalized)  Chronic bilateral low back pain without sciatica     Problem List There are no problems to display for this patient.  1:51 PM, 04/23/19 Josue Hector PT DPT  Physical Therapist with [facility]  [phone]   [facility] [address] [place], [place], [address] Phone: [phone]   Fax:  [phone]  Name: Edward Campos MRN: [id number] Date of Birth: 08-08-1932

## 2019-04-25 ENCOUNTER — Ambulatory Visit: Payer: Medicare Other

## 2019-04-27 ENCOUNTER — Ambulatory Visit (HOSPITAL_COMMUNITY): Payer: Medicare Other | Admitting: Physical Therapy

## 2019-04-30 ENCOUNTER — Ambulatory Visit (HOSPITAL_COMMUNITY): Payer: Medicare Other | Admitting: Physical Therapy

## 2019-05-04 ENCOUNTER — Other Ambulatory Visit: Payer: Self-pay

## 2019-05-04 ENCOUNTER — Encounter (HOSPITAL_COMMUNITY): Payer: Medicare Other | Admitting: Physical Therapy

## 2019-05-04 ENCOUNTER — Other Ambulatory Visit (HOSPITAL_COMMUNITY)
Admission: RE | Admit: 2019-05-04 | Discharge: 2019-05-04 | Disposition: A | Discharge status: Home / Self Care | Payer: Medicare Other | Source: Ambulatory Visit | Attending: Vascular Surgery | Admitting: Vascular Surgery

## 2019-05-04 DIAGNOSIS — Z01812 Encounter for preprocedural laboratory examination: Secondary | ICD-10-CM | POA: Diagnosis present

## 2019-05-04 DIAGNOSIS — Z20822 Contact with and (suspected) exposure to covid-19: Secondary | ICD-10-CM | POA: Insufficient documentation

## 2019-05-04 LAB — SARS CORONAVIRUS 2 (TAT 6-24 HRS): SARS Coronavirus 2: NEGATIVE

## 2019-05-06 ENCOUNTER — Encounter (HOSPITAL_COMMUNITY)
Admission: RE | Disposition: A | Discharge status: Home / Self Care | Payer: Self-pay | Source: Home / Self Care | Attending: Vascular Surgery

## 2019-05-06 ENCOUNTER — Ambulatory Visit (HOSPITAL_COMMUNITY)
Admission: RE | Admit: 2019-05-06 | Discharge: 2019-05-06 | Disposition: A | Discharge status: Home / Self Care | Payer: Medicare Other | Attending: Vascular Surgery | Admitting: Vascular Surgery

## 2019-05-06 ENCOUNTER — Other Ambulatory Visit: Payer: Self-pay

## 2019-05-06 DIAGNOSIS — I714 Abdominal aortic aneurysm, without rupture: Secondary | ICD-10-CM | POA: Insufficient documentation

## 2019-05-06 DIAGNOSIS — I743 Embolism and thrombosis of arteries of the lower extremities: Secondary | ICD-10-CM | POA: Insufficient documentation

## 2019-05-06 DIAGNOSIS — I701 Atherosclerosis of renal artery: Secondary | ICD-10-CM | POA: Insufficient documentation

## 2019-05-06 DIAGNOSIS — M1990 Unspecified osteoarthritis, unspecified site: Secondary | ICD-10-CM | POA: Insufficient documentation

## 2019-05-06 DIAGNOSIS — F17210 Nicotine dependence, cigarettes, uncomplicated: Secondary | ICD-10-CM | POA: Diagnosis not present

## 2019-05-06 DIAGNOSIS — I745 Embolism and thrombosis of iliac artery: Secondary | ICD-10-CM | POA: Insufficient documentation

## 2019-05-06 DIAGNOSIS — E785 Hyperlipidemia, unspecified: Secondary | ICD-10-CM | POA: Insufficient documentation

## 2019-05-06 DIAGNOSIS — N400 Enlarged prostate without lower urinary tract symptoms: Secondary | ICD-10-CM | POA: Diagnosis not present

## 2019-05-06 DIAGNOSIS — I70213 Atherosclerosis of native arteries of extremities with intermittent claudication, bilateral legs: Secondary | ICD-10-CM | POA: Insufficient documentation

## 2019-05-06 HISTORY — PX: ABDOMINAL AORTOGRAM W/LOWER EXTREMITY: CATH118223

## 2019-05-06 LAB — POCT I-STAT, CHEM 8
BUN: 17 mg/dL (ref 8–23)
Calcium, Ion: 1.17 mmol/L (ref 1.15–1.40)
Chloride: 101 mmol/L (ref 98–111)
Creatinine, Ser: 0.8 mg/dL (ref 0.61–1.24)
Glucose, Bld: 92 mg/dL (ref 70–99)
HCT: 40 % (ref 39.0–52.0)
Hemoglobin: 13.6 g/dL (ref 13.0–17.0)
Potassium: 4 mmol/L (ref 3.5–5.1)
Sodium: 136 mmol/L (ref 135–145)
TCO2: 25 mmol/L (ref 22–32)

## 2019-05-06 LAB — POCT ACTIVATED CLOTTING TIME: Activated Clotting Time: 164 seconds

## 2019-05-06 SURGERY — ABDOMINAL AORTOGRAM W/LOWER EXTREMITY
Anesthesia: LOCAL

## 2019-05-06 MED ORDER — SODIUM CHLORIDE 0.9% FLUSH: 3.0000 mL | Freq: Two times a day (BID) | INTRAVENOUS | Status: DC

## 2019-05-06 MED ORDER — IODIXANOL 320 MG/ML IV SOLN
INTRAVENOUS | Status: DC | PRN
  Administered 2019-05-06: 157 mL via INTRA_ARTERIAL

## 2019-05-06 MED ORDER — ACETAMINOPHEN 325 MG PO TABS: 650.0000 mg | ORAL_TABLET | ORAL | Status: DC | PRN

## 2019-05-06 MED ORDER — LIDOCAINE HCL (PF) 1 % IJ SOLN
INTRAMUSCULAR | Status: AC
  Filled 2019-05-06: qty 30

## 2019-05-06 MED ORDER — HEPARIN SODIUM (PORCINE) 1000 UNIT/ML IJ SOLN
INTRAMUSCULAR | Status: AC
  Filled 2019-05-06: qty 1

## 2019-05-06 MED ORDER — SODIUM CHLORIDE 0.9% FLUSH: 3.0000 mL | INTRAVENOUS | Status: DC | PRN

## 2019-05-06 MED ORDER — ONDANSETRON HCL 4 MG/2ML IJ SOLN: 4.0000 mg | Freq: Four times a day (QID) | INTRAMUSCULAR | Status: DC | PRN

## 2019-05-06 MED ORDER — HYDRALAZINE HCL 20 MG/ML IJ SOLN: 5.0000 mg | INTRAMUSCULAR | Status: DC | PRN

## 2019-05-06 MED ORDER — HEPARIN (PORCINE) IN NACL 1000-0.9 UT/500ML-% IV SOLN
INTRAVENOUS | Status: AC
  Filled 2019-05-06: qty 1000

## 2019-05-06 MED ORDER — HEPARIN (PORCINE) IN NACL 1000-0.9 UT/500ML-% IV SOLN
INTRAVENOUS | Status: DC | PRN
  Administered 2019-05-06 (×2): 500 mL

## 2019-05-06 MED ORDER — LIDOCAINE HCL (PF) 1 % IJ SOLN
INTRAMUSCULAR | Status: DC | PRN
  Administered 2019-05-06: 15 mL via INTRADERMAL

## 2019-05-06 MED ORDER — SODIUM CHLORIDE 0.9 % IV SOLN: 250.0000 mL | INTRAVENOUS | Status: DC | PRN

## 2019-05-06 MED ORDER — SODIUM CHLORIDE 0.9 % WEIGHT BASED INFUSION: 1.0000 mL/kg/h | INTRAVENOUS | Status: DC

## 2019-05-06 MED ORDER — HEPARIN SODIUM (PORCINE) 1000 UNIT/ML IJ SOLN
INTRAMUSCULAR | Status: DC | PRN
  Administered 2019-05-06: 3000 [IU] via INTRAVENOUS

## 2019-05-06 MED ORDER — SODIUM CHLORIDE 0.9 % IV SOLN: INTRAVENOUS | Status: DC

## 2019-05-06 MED ORDER — LABETALOL HCL 5 MG/ML IV SOLN: 10.0000 mg | INTRAVENOUS | Status: DC | PRN

## 2019-05-06 SURGICAL SUPPLY — 12 items
CATH ACCU-VU SIZ PIG 5F 70CM (CATHETERS) ×1 IMPLANT
DEVICE TORQUE .025-.038 (MISCELLANEOUS) ×1 IMPLANT
GUIDEWIRE ANGLED .035X150CM (WIRE) ×1 IMPLANT
KIT MICROPUNCTURE NIT STIFF (SHEATH) ×1 IMPLANT
KIT PV (KITS) ×2 IMPLANT
SHEATH PINNACLE 5F 10CM (SHEATH) ×1 IMPLANT
SHEATH PROBE COVER 6X72 (BAG) ×1 IMPLANT
SYR MEDRAD MARK V 150ML (SYRINGE) ×1 IMPLANT
TRANSDUCER W/STOPCOCK (MISCELLANEOUS) ×2 IMPLANT
TRAY PV CATH (CUSTOM PROCEDURE TRAY) ×2 IMPLANT
WIRE BENTSON .035X145CM (WIRE) ×1 IMPLANT
WIRE TORQFLEX AUST .018X40CM (WIRE) ×1 IMPLANT

## 2019-05-06 NOTE — Progress Notes (Signed)
Site area: Right groin a 5 french arterial sheath was removed  Site Prior to Removal:  Level 0  Pressure Applied For 15 MINUTES     BedrestBeginning at 1100am  Manual:   Yes.    Patient Status During Pull:  stable  Post Pull Groin Site:  Level 0  Post Pull Instructions Given:  Yes.    Post Pull Pulses Present:  Yes.    Dressing Applied:  Yes.    Comments:

## 2019-05-06 NOTE — H&P (Signed)
History and Physical Interval Note:  05/06/2019 9:06 AM  Edward Campos  has presented today for surgery, with the diagnosis of pad.  The various methods of treatment have been discussed with the patient and family. After consideration of risks, benefits and other options for treatment, the patient has consented to  Procedure(s): ABDOMINAL AORTOGRAM W/LOWER EXTREMITY (N/A) as a surgical intervention.  The patient's history has been reviewed, patient examined, no change in status, stable for surgery.  I have reviewed the patient's chart and labs.  Questions were answered to the patient's satisfaction.    Plan for aortogram and bilateral lower extremity arteriogram.  I have discussed case with Dr. Oneida Alar and patient has 4.7 cm infra-renal AAA and occlusive disease with claudication and Dr. Oneida Alar wants updated imaging to determine options for repair including possible hybrid approach vs aortobifemoral bypass.  I have discussed this with the patient as well.    Edward Campos  Patient is an 86 year old male who returns today for follow-up after recent CT angiogram of the abdomen and pelvis with lower extremity runoff.  He was initially sent for evaluation of his abdominal aortic aneurysm.  He does not have abdominal pain currently.  He does have intermittent chronic back pain.  He continues to have pain over the last 4 years that occurs in the calf and posterior thigh both legs after walking about a block.  He is continuing to try to quit smoking.      Past Medical History:  Diagnosis Date  . AAA (abdominal aortic aneurysm) ([place])   . Arthritis   . BPH (benign prostatic hyperplasia)   . Dizziness   . Hyperlipidemia          Past Surgical History:  Procedure Laterality Date  . growth removed from chest    . HEMORRHOID SURGERY    . HERNIA REPAIR  12 yrs ago   left inguinal hernia  . INGUINAL HERNIA REPAIR  10/16/2010   Procedure: HERNIA REPAIR INGUINAL ADULT;  Surgeon:  Jamesetta So;  Location: [facility];  Service: General;  Laterality: Right;           Current Outpatient Medications on File Prior to Visit  Medication Sig Dispense Refill  . ibuprofen (ADVIL,MOTRIN) 200 MG tablet Take 200 mg by mouth every 6 (six) hours as needed. For pain     . Multiple Vitamins-Minerals (MULTIVITAMIN WITH MINERALS) tablet Take 1 tablet by mouth daily.       No current facility-administered medications on file prior to visit.   Social History        Socioeconomic History  . Marital status: Single    Spouse name: Not on file  . Number of children: Not on file  . Years of education: Not on file  . Highest education level: Not on file  Occupational History  . Not on file  Tobacco Use  . Smoking status: Current Every Day Smoker    Packs/day: 0.25    Years: 60.00    Pack years: 15.00    Types: Cigarettes  . Smokeless tobacco: Never Used  Substance and Sexual Activity  . Alcohol use: Yes    Alcohol/week: 3.0 standard drinks    Types: 3 Shots of liquor per week    Comment: 3 2oz. shots of bourbon/day  . Drug use: No  . Sexual activity: Not on file  Other Topics Concern  . Not on file  Social History Narrative  . Not on file   Social Determinants of  Health      Financial Resource Strain:   . Difficulty of Paying Living Expenses: Not on file  Food Insecurity:   . Worried About Programme researcher, broadcasting/film/video in the Last Year: Not on file  . Ran Out of Food in the Last Year: Not on file  Transportation Needs:   . Lack of Transportation (Medical): Not on file  . Lack of Transportation (Non-Medical): Not on file  Physical Activity:   . Days of Exercise per Week: Not on file  . Minutes of Exercise per Session: Not on file  Stress:   . Feeling of Stress : Not on file  Social Connections:   . Frequency of Communication with Friends and Family: Not on file  . Frequency of Social Gatherings with Friends and Family: Not on file  . Attends  Religious Services: Not on file  . Active Member of Clubs or Organizations: Not on file  . Attends Banker Meetings: Not on file  . Marital Status: Not on file  Intimate Partner Violence:   . Fear of Current or Ex-Partner: Not on file  . Emotionally Abused: Not on file  . Physically Abused: Not on file  . Sexually Abused: Not on file    Physical exam:     Vitals:   03/12/19 1037  BP: 132/73  Pulse: (!) 58  Resp: 20  Temp: 97.6 F (36.4 C)  SpO2: 99%  Weight: 163 lb (73.9 kg)  Height: 5' 10.5" (1.791 m)   General white male no acute distress  Data: I reviewed the patient's CT angiogram images which shows a 4.7 cm infrarenal abdominal aortic aneurysm.  There is a moderate stenosis of the right external iliac and common femoral artery.  The left external iliac and common femoral arteries are occluded.  There is a small left internal iliac artery aneurysm at 1 cm.  Images obtained of the runoff were intact.  Patient had bilateral ABIs performed today which were 0.7 bilaterally I reviewed and interpreted the study.   Assessment: #1 abdominal aortic aneurysm 4.7 cm in diameter.  He will need a follow-up ultrasound of his aorta in 6 months time.  2.  Left internal iliac artery aneurysm 1 cm consider repair if greater than 3 cm will need to reimage in several years potentially   3.  Peripheral arterial disease with bilateral iliac and common femoral artery occlusive disease.  I discussed with patient today the possibility that revascularization for his claudication type symptoms would most likely involve an aortobifemoral bypass graft.  I discussed with him that this would be a fairly extensive operation and I did not feel that he would be a candidate for any percutaneous options for these lesions.  If he decides to opt for treatment of his claudication we could certainly do an arteriogram as the next step to consider doing an aortobifemoral bypass graft.  Other  options could potentially be a femoral-femoral bypass graft however everything is complicated by the fact he also has an aneurysm.  In discussions with the patient he is not really interested in any large operations due to his age.  He will continue to think about his symptoms and if he wishes to pursue this further we will talk about options for this.    Otherwise he will follow-up for his abdominal aortic aneurysm with an ultrasound of his aorta in 6 months time.  Patient did discuss quitting smoking today and I explained to him that certainly this would decrease  his risk of limb loss long-term and improve his overall health.  I did discuss with him that at his current level of perfusion his risk of limb loss life time is less than 5% if he is able to quit smoking.  Fabienne Bruns, MD [facility] Office: [phone]

## 2019-05-06 NOTE — Progress Notes (Signed)
Dr Chestine Spore in to see client

## 2019-05-06 NOTE — Discharge Instructions (Signed)
Femoral Site Care This sheet gives you information about how to care for yourself after your procedure. Your health care provider may also give you more specific instructions. If you have problems or questions, contact your health care provider. What can I expect after the procedure? After the procedure, it is common to have:  Bruising that usually fades within 1-2 weeks.  Tenderness at the site. Follow these instructions at home: Wound care  Follow instructions from your health care provider about how to take care of your insertion site. Make sure you: ? Wash your hands with soap and water before you change your bandage (dressing). If soap and water are not available, use hand sanitizer. ? Change your dressing as told by your health care provider. ? Leave stitches (sutures), skin glue, or adhesive strips in place. These skin closures may need to stay in place for 2 weeks or longer. If adhesive strip edges start to loosen and curl up, you may trim the loose edges. Do not remove adhesive strips completely unless your health care provider tells you to do that.  Do not take baths, swim, or use a hot tub until your health care provider approves.  You may shower 24-48 hours after the procedure or as told by your health care provider. ? Gently wash the site with plain soap and water. ? Pat the area dry with a clean towel. ? Do not rub the site. This may cause bleeding.  Do not apply powder or lotion to the site. Keep the site clean and dry.  Check your femoral site every day for signs of infection. Check for: ? Redness, swelling, or pain. ? Fluid or blood. ? Warmth. ? Pus or a bad smell. Activity  For the first 2-3 days after your procedure, or as long as directed: ? Avoid climbing stairs as much as possible. ? Do not squat.  Do not lift anything that is heavier than 10 lb (4.5 kg), or the limit that you are told, until your health care provider says that it is safe.  Rest as  directed. ? Avoid sitting for a long time without moving. Get up to take short walks every 1-2 hours.  Do not drive for 24 hours if you were given a medicine to help you relax (sedative). General instructions  Take over-the-counter and prescription medicines only as told by your health care provider.  Keep all follow-up visits as told by your health care provider. This is important. Contact a health care provider if you have:  A fever or chills.  You have redness, swelling, or pain around your insertion site. Get help right away if:  The catheter insertion area swells very fast.  You pass out.  You suddenly start to sweat or your skin gets clammy.  The catheter insertion area is bleeding, and the bleeding does not stop when you hold steady pressure on the area.  The area near or just beyond the catheter insertion site becomes pale, cool, tingly, or numb. These symptoms may represent a serious problem that is an emergency. Do not wait to see if the symptoms will go away. Get medical help right away. Call your local emergency services (911 in the [place] Do not drive yourself to the hospital. Summary  After the procedure, it is common to have bruising that usually fades within 1-2 weeks.  Check your femoral site every day for signs of infection.  Do not lift anything that is heavier than 10 lb (4.5 kg), or the   limit that you are told, until your health care provider says that it is safe. This information is not intended to replace advice given to you by your health care provider. Make sure you discuss any questions you have with your health care provider. Document Revised: 03/11/2017 Document Reviewed: 03/11/2017 Elsevier Patient Education  2020 [employer]  

## 2019-05-06 NOTE — Op Note (Signed)
Patient name: Edward Campos MRN: [id number] DOB: Jul 30, 1932 Sex: male  05/06/2019 Pre-operative Diagnosis:  4.7 cm infrarenal abdominal aortic aneurysm with aortoiliac occlusive disease and infra-inguinal occlusive disease in the setting of bilateral lower extremity claudication Post-operative diagnosis:  Same Surgeon:  Marty Heck, MD Procedure Performed: 1.  Ultrasound-guided access of the right common femoral artery 2.  Aortogram with bilateral iliac arteriograms 3.  Bilateral lower extremity arteriogram with runoff  Indications: Patient is an 86 year old male currently under the care of Dr. Oneida Alar with a known 4.7 cm infrarenal abdominal aortic aneurysm as well as underlying occlusive disease with bilateral lower extremity claudication.  He presents today for planned aortogram, bilateral lower extremity arteriogram to evaluate for other possible endovascular and/or hybrid options versus aortobifemoral bypass.  Risks and benefits have previously been discussed with the patient.  Findings:   Aortogram demonstrated at least 50-60% stenosis of the left renal artery at the ostium with his known infrarenal abdominal aortic aneurysm.  His left common iliac artery remained patent but highly tortuous.  The left external iliac artery as well as the common femoral is occluded on the left.  There is a large collateral off the left hypogastric artery into the left pelvis that reconstitutes the profunda retrograde with filling of the SFA that is heavily diseased.  Patient only has single-vessel runoff via the peroneal artery in the left lower extremity.  On the right patient has a patent common iliac, but very diseased external iliac with near occlusive lesion in the distal left external iliac artery.  The left common femoral artery is moderately diseased and heavily calcified with what appears to be SFA runoff that is very diseased throughout its course with multifocal high-grade stenosis and  very difficult to visualize any profunda on the right but does appear to be very delayed.  On the right he has single-vessel runoff via the peroneal as well.  , Procedure:  The patient was identified in the holding area and taken to room 8.  The patient was then placed supine on the table and prepped and draped in the usual sterile fashion.  A time out was called.  Ultrasound was used to evaluate the right common femoral artery.  It was havily disased, but patent .  A digital ultrasound image was acquired.  A micropuncture needle was used to access the right common femoral artery under ultrasound guidance.  An 018 wire was advanced with resistance.  Ultimately under fluoroscopy we were finally able to manipulate the microwire enough to get a micro sheath in place.  The micro sheath was not completely advanced given resistance.  I then used a soft angled glidewire along with a retrograde right common femoral shot and we were finally able to navigate the true lumen of the right iliac to get up in the infrarenal aorta.  Over this we then fully advanced the micro sheath and then exchanged for a short 5 [place] sheath.  I gave the patient 3000 units of IV heparin given his severe occlusive disease in the right side.  We then advanced a marker pigtail catheter into the infrarenal aorta.  Aortogram was obtained with additional iliac shots in both the right and left oblique positions.  We then got bilateral lower extremity runoff.  All pertinent findings are noted above.  That point in time wires and catheters were removed.  ACT was checked.  He was sent to holding to have a sheath removed in the right groin with manual pressure.  I was not comfortable deploying a closure device given the degree of disease in his right groin.   Plan: Dr. Darrick Penna will review the patient's images for next steps.  Cephus Shelling, MD [facility] Office: [phone]

## 2019-05-06 NOTE — Progress Notes (Signed)
Up and walked and tolerated well; right groin stable, no bleeding or hematoma 

## 2019-05-07 ENCOUNTER — Encounter (HOSPITAL_COMMUNITY): Payer: Medicare Other | Admitting: Physical Therapy

## 2019-05-11 ENCOUNTER — Encounter (HOSPITAL_COMMUNITY): Payer: Medicare Other | Admitting: Physical Therapy

## 2019-05-14 ENCOUNTER — Encounter (HOSPITAL_COMMUNITY): Payer: Medicare Other | Admitting: Physical Therapy

## 2019-05-20 ENCOUNTER — Telehealth (HOSPITAL_COMMUNITY): Payer: Self-pay

## 2019-05-20 NOTE — Telephone Encounter (Signed)

## 2019-05-21 ENCOUNTER — Encounter: Payer: Self-pay | Admitting: Vascular Surgery

## 2019-05-21 ENCOUNTER — Ambulatory Visit (INDEPENDENT_AMBULATORY_CARE_PROVIDER_SITE_OTHER): Payer: Medicare Other | Admitting: Vascular Surgery

## 2019-05-21 ENCOUNTER — Other Ambulatory Visit: Payer: Self-pay

## 2019-05-21 VITALS — BP 129/85 | HR 64 | Temp 98.2°F | Resp 20 | Ht 70.0 in | Wt 164.0 lb

## 2019-05-21 DIAGNOSIS — I739 Peripheral vascular disease, unspecified: Secondary | ICD-10-CM

## 2019-05-21 DIAGNOSIS — I714 Abdominal aortic aneurysm, without rupture, unspecified: Secondary | ICD-10-CM

## 2019-05-21 MED ORDER — ASPIRIN EC 81 MG PO TBEC: 81.0000 mg | DELAYED_RELEASE_TABLET | Freq: Every day | ORAL | 10 refills | Status: DC

## 2019-05-21 MED ORDER — ROSUVASTATIN CALCIUM 10 MG PO TABS: 10.0000 mg | ORAL_TABLET | Freq: Every day | ORAL | 11 refills | Status: DC

## 2019-05-21 NOTE — H&P (View-Only) (Signed)
Patient is an 86 year old male who returns for follow-up today.  He currently has claudication symptoms of about 100 feet left leg worse than the right.  He had a recent arteriogram which showed occlusion of the left external iliac and common femoral artery with reconstitution of a very diseased left superficial femoral artery and one-vessel peroneal runoff.  On the right side he had a diffusely diseased right external iliac and common iliac artery with a patent but very diseased right superficial femoral artery.  It is difficult to tell whether or not this fills from a profunda branch or the profunda on the right side is occluded.  He then also has one-vessel peroneal runoff to the right foot.  The patient feels very debilitated by his claudication symptoms.  Although he is 42 he is still very active.  He is trying to quit smoking and has set a quit date for May 30, 2019.  He is down to 3 cigarettes/day.  He is not currently on aspirin or a statin.  Most recent ABIs were 0.7 bilaterally  He also has a 4.7 cm infrarenal abdominal aortic aneurysm.  Current Outpatient Medications on File Prior to Visit  Medication Sig Dispense Refill  . ibuprofen (ADVIL,MOTRIN) 200 MG tablet Take 200-400 mg by mouth every 6 (six) hours as needed (For pain).     . Multiple Vitamins-Minerals (MULTIVITAMIN WITH MINERALS) tablet Take 1 tablet by mouth daily with lunch.      No current facility-administered medications on file prior to visit.   Past Medical History:  Diagnosis Date  . AAA (abdominal aortic aneurysm) (HCC)   . Arthritis   . BPH (benign prostatic hyperplasia)   . Dizziness   . Hyperlipidemia     Past Surgical History:  Procedure Laterality Date  . ABDOMINAL AORTOGRAM W/LOWER EXTREMITY N/A 05/06/2019   Procedure: ABDOMINAL AORTOGRAM W/LOWER EXTREMITY;  Surgeon: Cephus Shelling, MD;  Location: [facility] INVASIVE CV LAB;  Service: Cardiovascular;  Laterality: N/A;  . growth removed from chest    .  HEMORRHOID SURGERY    . HERNIA REPAIR  12 yrs ago   left inguinal hernia  . INGUINAL HERNIA REPAIR  10/16/2010   Procedure: HERNIA REPAIR INGUINAL ADULT;  Surgeon: Dalia Heading;  Location: [facility];  Service: General;  Laterality: Right;    Social History   Socioeconomic History  . Marital status: Single    Spouse name: Not on file  . Number of children: Not on file  . Years of education: Not on file  . Highest education level: Not on file  Occupational History  . Not on file  Tobacco Use  . Smoking status: Current Every Day Smoker    Packs/day: 0.25    Years: 60.00    Pack years: 15.00    Types: Cigarettes  . Smokeless tobacco: Never Used  . Tobacco comment: working on quitting  Substance and Sexual Activity  . Alcohol use: Yes    Alcohol/week: 3.0 standard drinks    Types: 3 Shots of liquor per week    Comment: 3 2oz. shots of bourbon/day  . Drug use: No  . Sexual activity: Not on file  Other Topics Concern  . Not on file  Social History Narrative  . Not on file   Social Determinants of Health   Financial Resource Strain:   . Difficulty of Paying Living Expenses:   Food Insecurity:   . Worried About Programme researcher, broadcasting/film/video in the Last Year:   . Ran  Out of Food in the Last Year:   Transportation Needs:   . Lack of Transportation (Medical):   Marland Kitchen Lack of Transportation (Non-Medical):   Physical Activity:   . Days of Exercise per Week:   . Minutes of Exercise per Session:   Stress:   . Feeling of Stress :   Social Connections:   . Frequency of Communication with Friends and Family:   . Frequency of Social Gatherings with Friends and Family:   . Attends Religious Services:   . Active Member of Clubs or Organizations:   . Attends Archivist Meetings:   Marland Kitchen Marital Status:   Intimate Partner Violence:   . Fear of Current or Ex-Partner:   . Emotionally Abused:   Marland Kitchen Physically Abused:   . Sexually Abused:     Review of systems: He has no shortness of breath.   He has no chest pain.  Physical exam:  Vitals:   05/21/19 1026  BP: 129/85  Pulse: 64  Resp: 20  Temp: 98.2 F (36.8 C)  SpO2: 100%  Weight: 164 lb (74.4 kg)  Height: 5\' 10"  (1.778 m)    Extremities: No palpable pedal pulses no open ulcer or wound Abdomen: Soft nontender nondistended no mass full aortic pulsation  Extremity pulses: 2+ radial brachial absent femoral popliteal dorsalis pedis posterior tibial pulses bilaterally  Assessment: Patient with lifestyle limiting claudication symptoms severe aortoiliac and lower extremity occlusive disease.  I had a lengthy discussion with the patient today and his daughter.  I discussed that he is not currently at risk of limb loss especially if he quit smoking.  I also discussed the option of fixing his aneurysm and iliac occlusive disease at the same setting which would be a very big operation for him.  I think instead a compromise operation of stenting his right iliac system with an 8 mm VBX followed by femoral-femoral bypass to the left side would alleviate most of his symptoms.  By using the larger VBX stent on the right size we would preserve this for potential stent graft at some point the future if necessary.  Certainly sizing may have to change depending on what we find in the operating room.  Plan: 1.  Patient will have cardiac risk stratification within the next couple of weeks.  2.  Patient will be scheduled for right iliac stent and right to left femoral-femoral bypass on June 08, 2019.  Risk benefits possible complications of procedure details were discussed with the patient and his daughter today.  These include but are not limited to bleeding infection limb loss myocardial events.  He understands and agrees to proceed.  Ruta Hinds, MD [facility] Office: [phone]

## 2019-05-21 NOTE — Progress Notes (Signed)
Patient is an 86 year old male who returns for follow-up today.  He currently has claudication symptoms of about 100 feet left leg worse than the right.  He had a recent arteriogram which showed occlusion of the left external iliac and common femoral artery with reconstitution of a very diseased left superficial femoral artery and one-vessel peroneal runoff.  On the right side he had a diffusely diseased right external iliac and common iliac artery with a patent but very diseased right superficial femoral artery.  It is difficult to tell whether or not this fills from a profunda branch or the profunda on the right side is occluded.  He then also has one-vessel peroneal runoff to the right foot.  The patient feels very debilitated by his claudication symptoms.  Although he is 42 he is still very active.  He is trying to quit smoking and has set a quit date for May 30, 2019.  He is down to 3 cigarettes/day.  He is not currently on aspirin or a statin.  Most recent ABIs were 0.7 bilaterally  He also has a 4.7 cm infrarenal abdominal aortic aneurysm.  Current Outpatient Medications on File Prior to Visit  Medication Sig Dispense Refill  . ibuprofen (ADVIL,MOTRIN) 200 MG tablet Take 200-400 mg by mouth every 6 (six) hours as needed (For pain).     . Multiple Vitamins-Minerals (MULTIVITAMIN WITH MINERALS) tablet Take 1 tablet by mouth daily with lunch.      No current facility-administered medications on file prior to visit.   Past Medical History:  Diagnosis Date  . AAA (abdominal aortic aneurysm) (HCC)   . Arthritis   . BPH (benign prostatic hyperplasia)   . Dizziness   . Hyperlipidemia     Past Surgical History:  Procedure Laterality Date  . ABDOMINAL AORTOGRAM W/LOWER EXTREMITY N/A 05/06/2019   Procedure: ABDOMINAL AORTOGRAM W/LOWER EXTREMITY;  Surgeon: Cephus Shelling, MD;  Location: [facility] INVASIVE CV LAB;  Service: Cardiovascular;  Laterality: N/A;  . growth removed from chest    .  HEMORRHOID SURGERY    . HERNIA REPAIR  12 yrs ago   left inguinal hernia  . INGUINAL HERNIA REPAIR  10/16/2010   Procedure: HERNIA REPAIR INGUINAL ADULT;  Surgeon: Dalia Heading;  Location: [facility];  Service: General;  Laterality: Right;    Social History   Socioeconomic History  . Marital status: Single    Spouse name: Not on file  . Number of children: Not on file  . Years of education: Not on file  . Highest education level: Not on file  Occupational History  . Not on file  Tobacco Use  . Smoking status: Current Every Day Smoker    Packs/day: 0.25    Years: 60.00    Pack years: 15.00    Types: Cigarettes  . Smokeless tobacco: Never Used  . Tobacco comment: working on quitting  Substance and Sexual Activity  . Alcohol use: Yes    Alcohol/week: 3.0 standard drinks    Types: 3 Shots of liquor per week    Comment: 3 2oz. shots of bourbon/day  . Drug use: No  . Sexual activity: Not on file  Other Topics Concern  . Not on file  Social History Narrative  . Not on file   Social Determinants of Health   Financial Resource Strain:   . Difficulty of Paying Living Expenses:   Food Insecurity:   . Worried About Programme researcher, broadcasting/film/video in the Last Year:   . Ran  Out of Food in the Last Year:   Transportation Needs:   . Lack of Transportation (Medical):   . Lack of Transportation (Non-Medical):   Physical Activity:   . Days of Exercise per Week:   . Minutes of Exercise per Session:   Stress:   . Feeling of Stress :   Social Connections:   . Frequency of Communication with Friends and Family:   . Frequency of Social Gatherings with Friends and Family:   . Attends Religious Services:   . Active Member of Clubs or Organizations:   . Attends Club or Organization Meetings:   . Marital Status:   Intimate Partner Violence:   . Fear of Current or Ex-Partner:   . Emotionally Abused:   . Physically Abused:   . Sexually Abused:     Review of systems: He has no shortness of breath.   He has no chest pain.  Physical exam:  Vitals:   05/21/19 1026  BP: 129/85  Pulse: 64  Resp: 20  Temp: 98.2 F (36.8 C)  SpO2: 100%  Weight: 164 lb (74.4 kg)  Height: 5' 10" (1.778 m)    Extremities: No palpable pedal pulses no open ulcer or wound Abdomen: Soft nontender nondistended no mass full aortic pulsation  Extremity pulses: 2+ radial brachial absent femoral popliteal dorsalis pedis posterior tibial pulses bilaterally  Assessment: Patient with lifestyle limiting claudication symptoms severe aortoiliac and lower extremity occlusive disease.  I had a lengthy discussion with the patient today and his daughter.  I discussed that he is not currently at risk of limb loss especially if he quit smoking.  I also discussed the option of fixing his aneurysm and iliac occlusive disease at the same setting which would be a very big operation for him.  I think instead a compromise operation of stenting his right iliac system with an 8 mm VBX followed by femoral-femoral bypass to the left side would alleviate most of his symptoms.  By using the larger VBX stent on the right size we would preserve this for potential stent graft at some point the future if necessary.  Certainly sizing may have to change depending on what we find in the operating room.  Plan: 1.  Patient will have cardiac risk stratification within the next couple of weeks.  2.  Patient will be scheduled for right iliac stent and right to left femoral-femoral bypass on June 08, 2019.  Risk benefits possible complications of procedure details were discussed with the patient and his daughter today.  These include but are not limited to bleeding infection limb loss myocardial events.  He understands and agrees to proceed.  Billiejean Schimek, MD [facility] Office: [phone]  

## 2019-05-28 NOTE — Progress Notes (Signed)
Cardiology Office Note:   Date:  05/29/2019  NAME:  Edward Campos    MRN: [id number] DOB:  25-Feb-1933   PCP:  Patient, No Pcp Per  Cardiologist:  Reatha Harps, MD   Referring MD: Sherren Kerns, MD   Chief Complaint  Patient presents with  . Pre-op Exam   History of Present Illness:   Edward Campos is a 86 y.o. male with a hx of PAD, HLD, tobacco abuse who is being seen today for the evaluation of preoperative assessment at the request of Fields, Janetta Hora, MD.  He presents for the evaluation of preoperative assessment.  Was recently found to have a AAA as well as severe PAD as detailed below.  He does have plans for bypass surgery with Dr. Darrick Penna.  He reports that his AAA and vascular disease was found due to back pain.  He was seen by orthopedics originally and then found to have a AAA on MRI.  He was then followed by vascular surgery who is found his severe PAD.  They will undergo planned procedure which is bypass surgery on 06/08/2019.  He is not a primary care physician as I had recent labs.  He does have a BMP recently which is normal.  No recent thyroid studies, lipid profile, A1c.  There is no medical history of hypertension.  Regarding activity level he reports that his back pain limits him.  He is only able to walk less than 100 yards before pain in his legs precludes further walking.  His EKG today is in sinus bradycardia and shows no ST changes concerning for ischemia or infarction.  No echocardiogram and no stress test recently.  I do not have a lipid profile.  He does have a carotid bruit on examination.  I did inform him that he will need significant testing before you go to surgery.  He is a little nervous about having to have further testing prior to his surgery.  His surgery will be done in 10 days.  Problem List 1. PAD -R TBI 0.36 -L TBI 0.21 -L renal aertery stenosis 60% -L external iliac artery occlusion -L CFA occlusion  -R common iliac severe occlusion  2.  AAA -4.6 cm  Past Medical History: Past Medical History:  Diagnosis Date  . AAA (abdominal aortic aneurysm) (HCC)   . Arthritis   . BPH (benign prostatic hyperplasia)   . Dizziness   . Hyperlipidemia     Past Surgical History: Past Surgical History:  Procedure Laterality Date  . ABDOMINAL AORTOGRAM W/LOWER EXTREMITY N/A 05/06/2019   Procedure: ABDOMINAL AORTOGRAM W/LOWER EXTREMITY;  Surgeon: Cephus Shelling, MD;  Location: [facility] INVASIVE CV LAB;  Service: Cardiovascular;  Laterality: N/A;  . growth removed from chest    . HEMORRHOID SURGERY    . HERNIA REPAIR  12 yrs ago   left inguinal hernia  . INGUINAL HERNIA REPAIR  10/16/2010   Procedure: HERNIA REPAIR INGUINAL ADULT;  Surgeon: Dalia Heading;  Location: [facility];  Service: General;  Laterality: Right;    Current Medications: Current Meds  Medication Sig  . aspirin EC 81 MG tablet Take 1 tablet (81 mg total) by mouth daily.  . Multiple Vitamins-Minerals (MULTIVITAMIN WITH MINERALS) tablet Take 1 tablet by mouth daily with lunch.   . rosuvastatin (CRESTOR) 10 MG tablet Take 1 tablet (10 mg total) by mouth daily.     Allergies:    Penicillins   Social History: Social History   Socioeconomic History  .  Marital status: Divorced    Spouse name: Not on file  . Number of children: 3  . Years of education: Not on file  . Highest education level: Not on file  Occupational History  . Not on file  Tobacco Use  . Smoking status: Current Every Day Smoker    Packs/day: 0.25    Years: 70.00    Pack years: 17.50    Types: Cigarettes  . Smokeless tobacco: Never Used  . Tobacco comment: working on quitting  Substance and Sexual Activity  . Alcohol use: Yes    Alcohol/week: 3.0 standard drinks    Types: 3 Shots of liquor per week    Comment: 3 2oz. shots of bourbon/day  . Drug use: No  . Sexual activity: Not on file  Other Topics Concern  . Not on file  Social History Narrative  . Not on file   Social Determinants  of Health   Financial Resource Strain:   . Difficulty of Paying Living Expenses:   Food Insecurity:   . Worried About Programme researcher, broadcasting/film/video in the Last Year:   . Barista in the Last Year:   Transportation Needs:   . Freight forwarder (Medical):   Marland Kitchen Lack of Transportation (Non-Medical):   Physical Activity:   . Days of Exercise per Week:   . Minutes of Exercise per Session:   Stress:   . Feeling of Stress :   Social Connections:   . Frequency of Communication with Friends and Family:   . Frequency of Social Gatherings with Friends and Family:   . Attends Religious Services:   . Active Member of Clubs or Organizations:   . Attends Banker Meetings:   Marland Kitchen Marital Status:      Family History: The patient's family history includes Stroke in his mother. There is no history of Pseudochol deficiency, Malignant hyperthermia, Hypotension, or Anesthesia problems.  ROS:   All other ROS reviewed and negative. Pertinent positives noted in the HPI.     EKGs/Labs/Other Studies Reviewed:   The following studies were personally reviewed by me today:  EKG:  EKG is ordered today.  The ekg ordered today demonstrates sinus bradycardia, heart rate 57, no acute ST-T changes, no evidence of prior infarction, and was personally reviewed by me.   Recent Labs: 05/06/2019: BUN 17; Creatinine, Ser 0.80; Hemoglobin 13.6; Potassium 4.0; Sodium 136   Recent Lipid Panel No results found for: CHOL, TRIG, HDL, CHOLHDL, VLDL, LDLCALC, LDLDIRECT  Physical Exam:   VS:  BP 140/73   Pulse (!) 57   Temp (!) 96.9 F (36.1 C)   Ht 5\' 10"  (1.778 m)   Wt 163 lb 12.8 oz (74.3 kg)   SpO2 97%   BMI 23.50 kg/m    Wt Readings from Last 3 Encounters:  05/29/19 163 lb 12.8 oz (74.3 kg)  05/21/19 164 lb (74.4 kg)  05/06/19 164 lb (74.4 kg)    General: Well nourished, well developed, in no acute distress Heart: Atraumatic, normal size  Eyes: PEERLA, EOMI  Neck: Left carotid bruit  present Endocrine: No thryomegaly Cardiac: Normal S1, S2; RRR; no murmurs, rubs, or gallops Lungs: Clear to auscultation bilaterally, no wheezing, rhonchi or rales  Abd: Soft, nontender, no hepatomegaly  Ext: Absent pulses in the lower extremities bilaterally Musculoskeletal: No deformities, BUE and BLE strength normal and equal Skin: Warm and dry, no rashes   Neuro: Alert and oriented to person, place, time, and situation, CNII-XII grossly  intact, no focal deficits  Psych: Normal mood and affect   ASSESSMENT:   Edward Campos is a 86 y.o. male who presents for the following: 1. Preoperative cardiovascular examination   2. PAD (peripheral artery disease) (HCC)   3. AAA (abdominal aortic aneurysm) without rupture (HCC)   4. Mixed hyperlipidemia   5. Tobacco abuse   6. Bruit     PLAN:   1. Preoperative cardiovascular examination -He will have high risk vascular surgery.  No real clear medical history as he is never really seen a primary care physician.  He is unable to complete 4 METS.  Given the fact that he will have a high risk procedure I recommended a Lexiscan nuclear medicine stress test.  He is severely limited by his claudication symptoms and unable to exercise on a treadmill.   -He also has a left carotid bruit.  He needs a vascular ultrasound prior to surgery.  This will be important for anesthesia. -I have also recommended an echocardiogram.  He has no evidence of murmur on examination but will be good to have a baseline as he will have high risk surgery. -No real labs other than a recent BMP.  We will obtain a TSH, lipid profile, A1c at the time of the stress test.  2. PAD (peripheral artery disease) (HCC) -Extensive PAD as detailed above.  He has a AAA as well.  He will undergo bypass surgery with Dr. Darrick Penna.  We will pursue the above testing which include stress test, echo, carotid ultrasounds prior to surgery.  3. AAA (abdominal aortic aneurysm) without rupture  (HCC) -Monitor by Dr. Darrick Penna.  4. Mixed hyperlipidemia -No recent lipid profile.  Will obtain lipid profile at the time of stress test.  5. Tobacco abuse -70 years of smoking.  Advised to quit.  6. Bruit -Vascular ultrasound bilaterally of the carotid arteries  Disposition: Return in about 6 months (around 11/29/2019).  Medication Adjustments/Labs and Tests Ordered: Current medicines are reviewed at length with the patient today.  Concerns regarding medicines are outlined above.  Orders Placed This Encounter  Procedures  . TSH  . Lipid panel  . Hemoglobin A1c  . MYOCARDIAL PERFUSION IMAGING  . EKG 12-Lead  . ECHOCARDIOGRAM COMPLETE  . VAS US CAROTID   No orders of the defined types were placed in this encounter.   Patient Instructions  Medication Instructions:  The current medical regimen is effective;  continue present plan and medications.  *If you need a refill on your cardiac medications before your next appointment, please call your pharmacy*   Lab Work: TSH, LIPID, A1C  If you have labs (blood work) drawn today and your tests are completely normal, you will receive your results only by: Marland Kitchen MyChart Message (if you have MyChart) OR . A paper copy in the mail If you have any lab test that is abnormal or we need to change your treatment, we will call you to review the results.   Testing/Procedures: Your physician has requested that you have a carotid duplex. This test is an ultrasound of the carotid arteries in your neck. It looks at blood flow through these arteries that supply the brain with blood. Allow one hour for this exam. There are no restrictions or special instructions.  Your physician has requested that you have a lexiscan myoview. A cardiac stress test is a cardiological test that measures the heart's ability to respond to external stress in a controlled clinical environment. The stress response is induced by intravenous  pharmacological stimulation.    Echocardiogram - Your physician has requested that you have an echocardiogram. Echocardiography is a painless test that uses sound waves to create images of your heart. It provides your doctor with information about the size and shape of your heart and how well your heart's chambers and valves are working. This procedure takes approximately one hour. There are no restrictions for this procedure. This will be performed at our [facility] location - [address].    Follow-Up: At [facility], you and your health needs are our priority.  As part of our continuing mission to provide you with exceptional heart care, we have created designated Provider Care Teams.  These Care Teams include your primary Cardiologist (physician) and Advanced Practice Providers (APPs -  Physician Assistants and Nurse Practitioners) who all work together to provide you with the care you need, when you need it.  We recommend signing up for the patient portal called "MyChart".  Sign up information is provided on this After Visit Summary.  MyChart is used to connect with patients for Virtual Visits (Telemedicine).  Patients are able to view lab/test results, encounter notes, upcoming appointments, etc.  Non-urgent messages can be sent to your provider as well.   To learn more about what you can do with MyChart, go to [email or web address].    Your next appointment:   6 month(s)  The format for your next appointment:   In Person  Provider:   Eleonore Chiquito, MD       Signed, Addison Naegeli. Audie Box, [place]  [address] [place], [place] [address] [phone]  05/29/2019 3:25 PM

## 2019-05-29 ENCOUNTER — Encounter: Payer: Self-pay | Admitting: Cardiovascular Disease

## 2019-05-29 ENCOUNTER — Ambulatory Visit (INDEPENDENT_AMBULATORY_CARE_PROVIDER_SITE_OTHER): Payer: Medicare Other | Admitting: Cardiovascular Disease

## 2019-05-29 ENCOUNTER — Other Ambulatory Visit: Payer: Self-pay

## 2019-05-29 VITALS — BP 140/73 | HR 57 | Temp 96.9°F | Ht 70.0 in | Wt 163.8 lb

## 2019-05-29 DIAGNOSIS — I714 Abdominal aortic aneurysm, without rupture, unspecified: Secondary | ICD-10-CM

## 2019-05-29 DIAGNOSIS — E782 Mixed hyperlipidemia: Secondary | ICD-10-CM | POA: Diagnosis not present

## 2019-05-29 DIAGNOSIS — Z01810 Encounter for preprocedural cardiovascular examination: Secondary | ICD-10-CM | POA: Diagnosis not present

## 2019-05-29 DIAGNOSIS — I739 Peripheral vascular disease, unspecified: Secondary | ICD-10-CM

## 2019-05-29 DIAGNOSIS — R0989 Other specified symptoms and signs involving the circulatory and respiratory systems: Secondary | ICD-10-CM

## 2019-05-29 DIAGNOSIS — Z720 Tobacco use: Secondary | ICD-10-CM

## 2019-05-29 NOTE — Patient Instructions (Signed)
Medication Instructions:  The current medical regimen is effective;  continue present plan and medications.  *If you need a refill on your cardiac medications before your next appointment, please call your pharmacy*   Lab Work: TSH, LIPID, A1C  If you have labs (blood work) drawn today and your tests are completely normal, you will receive your results only by: Marland Kitchen MyChart Message (if you have MyChart) OR . A paper copy in the mail If you have any lab test that is abnormal or we need to change your treatment, we will call you to review the results.   Testing/Procedures: Your physician has requested that you have a carotid duplex. This test is an ultrasound of the carotid arteries in your neck. It looks at blood flow through these arteries that supply the brain with blood. Allow one hour for this exam. There are no restrictions or special instructions.  Your physician has requested that you have a lexiscan myoview. A cardiac stress test is a cardiological test that measures the heart's ability to respond to external stress in a controlled clinical environment. The stress response is induced by intravenous pharmacological stimulation.   Echocardiogram - Your physician has requested that you have an echocardiogram. Echocardiography is a painless test that uses sound waves to create images of your heart. It provides your doctor with information about the size and shape of your heart and how well your heart's chambers and valves are working. This procedure takes approximately one hour. There are no restrictions for this procedure. This will be performed at our [facility] location - [address].    Follow-Up: At [facility], you and your health needs are our priority.  As part of our continuing mission to provide you with exceptional heart care, we have created designated Provider Care Teams.  These Care Teams include your primary Cardiologist (physician) and Advanced Practice  Providers (APPs -  Physician Assistants and Nurse Practitioners) who all work together to provide you with the care you need, when you need it.  We recommend signing up for the patient portal called "MyChart".  Sign up information is provided on this After Visit Summary.  MyChart is used to connect with patients for Virtual Visits (Telemedicine).  Patients are able to view lab/test results, encounter notes, upcoming appointments, etc.  Non-urgent messages can be sent to your provider as well.   To learn more about what you can do with MyChart, go to [email or web address].    Your next appointment:   6 month(s)  The format for your next appointment:   In Person  Provider:   Lennie Odor, MD

## 2019-05-30 LAB — LIPID PANEL
Chol/HDL Ratio: 3 ratio (ref 0.0–5.0)
Cholesterol, Total: 143 mg/dL (ref 100–199)
HDL: 48 mg/dL (ref >39)
LDL Chol Calc (NIH): 76 mg/dL (ref 0–99)
Triglycerides: 101 mg/dL (ref 0–149)
VLDL Cholesterol Cal: 19 mg/dL (ref 5–40)

## 2019-05-30 LAB — TSH: TSH: 2.54 u[IU]/mL (ref 0.450–4.500)

## 2019-05-30 LAB — HEMOGLOBIN A1C
Est. average glucose Bld gHb Est-mCnc: 97 mg/dL
Hgb A1c MFr Bld: 5 % (ref 4.8–5.6)

## 2019-06-02 ENCOUNTER — Ambulatory Visit (HOSPITAL_COMMUNITY)
Admission: RE | Admit: 2019-06-02 | Discharge: 2019-06-02 | Disposition: A | Discharge status: Home / Self Care | Payer: Medicare Other | Source: Ambulatory Visit | Attending: Cardiovascular Disease | Admitting: Cardiovascular Disease

## 2019-06-02 ENCOUNTER — Other Ambulatory Visit: Payer: Self-pay

## 2019-06-02 DIAGNOSIS — Z01810 Encounter for preprocedural cardiovascular examination: Secondary | ICD-10-CM | POA: Diagnosis not present

## 2019-06-02 NOTE — Progress Notes (Signed)
*  PRELIMINARY RESULTS* Echocardiogram 2D Echocardiogram has been performed.  Edward Campos 06/02/2019, 4:01 PM

## 2019-06-03 ENCOUNTER — Telehealth (HOSPITAL_COMMUNITY): Payer: Self-pay

## 2019-06-03 NOTE — Telephone Encounter (Signed)
Encounter complete. 

## 2019-06-03 NOTE — Progress Notes (Signed)
[place] [place] - [address] [place] [address] Phone: [phone] Fax: [phone]  [id number] - [place], [address] AT [facility] [address] [place] [address] Phone: [phone] Fax: [phone]      Your procedure is scheduled on Monday, March 29th.  Report to [facility] Main Entrance "A" at 9:30 A.M., and check in at the Admitting office.  Call this number if you have problems the morning of surgery:  [phone]  Call [phone] if you have any questions prior to your surgery date Monday-Friday 8am-4pm    Remember:  Do not eat or drink after midnight the night before your surgery     Take these medicines the morning of surgery with A SIP OF WATER   Rosuvastatin (Crestor)   Follow your surgeon's instructions on when to stop Aspirin.  If no instructions were given by your surgeon then you will need to call the office to get those instructions.     As of today, stop taking all Aspirin (unless instructed by your doctor) and other Aspirin containing products, Vitamins, Fish Oils, and Herbal Medications. Also stop all NSAIDS i.e. Advil, Ibuprofen, Motrin, Aleve, Anaprox, Naproxen, BC, Goody Powders, and all Supplements.   No Smoking of any kind, Tobacco, or Alcohol products 24 hours prior to your procedure. If you use a CPAP at night, you may bring all equipment for your overnight stay.                        Do not wear jewelry            Do not wear lotions, powders, colognes, or deodorant.            Men may shave face and neck.            Do not bring valuables to the hospital.            [facility] is not responsible for any belongings or valuables.   Contacts, glasses, dentures or bridgework may not be worn into surgery.      For patients admitted to the hospital, discharge time will be determined by your treatment team.   Patients discharged the day of surgery will not be allowed to drive home, and  someone needs to stay with them for 24 hours.    Special instructions:   [facility]- Preparing For Surgery  Before surgery, you can play an important role. Because skin is not sterile, your skin needs to be as free of germs as possible. You can reduce the number of germs on your skin by washing with CHG (chlorahexidine gluconate) Soap before surgery.  CHG is an antiseptic cleaner which kills germs and bonds with the skin to continue killing germs even after washing.    Oral Hygiene is also important to reduce your risk of infection.  Remember - BRUSH YOUR TEETH THE MORNING OF SURGERY WITH YOUR REGULAR TOOTHPASTE  Please do not use if you have an allergy to CHG or antibacterial soaps. If your skin becomes reddened/irritated stop using the CHG.  Do not shave (including legs and underarms) for at least 48 hours prior to first CHG shower. It is OK to shave your face.  Please follow these instructions carefully.   1. Shower the NIGHT BEFORE SURGERY and the MORNING OF SURGERY with CHG Soap.   2. If you chose to wash your hair, wash your hair first as usual  with your normal shampoo.  3. After you shampoo, rinse your hair and body thoroughly to remove the shampoo.  4. Use CHG as you would any other liquid soap. You can apply CHG directly to the skin and wash gently with a scrungie or a clean washcloth.   5. Apply the CHG Soap to your body ONLY FROM THE NECK DOWN.  Do not use on open wounds or open sores. Avoid contact with your eyes, ears, mouth and genitals (private parts). Wash Face and genitals (private parts)  with your normal soap.   6. Wash thoroughly, paying special attention to the area where your surgery will be performed.  7. Thoroughly rinse your body with warm water from the neck down.  8. DO NOT shower/wash with your normal soap after using and rinsing off the CHG Soap.  9. Pat yourself dry with a CLEAN TOWEL.  10. Wear CLEAN PAJAMAS to bed the night before surgery, wear  comfortable clothes the morning of surgery  11. Place CLEAN SHEETS on your bed the night of your first shower and DO NOT SLEEP WITH PETS.   Day of Surgery:   Do not apply any deodorants/lotions.  Please wear clean clothes to the hospital/surgery center.   Remember to brush your teeth WITH YOUR REGULAR TOOTHPASTE.   Please read over the following fact sheets that you were given.

## 2019-06-04 ENCOUNTER — Other Ambulatory Visit: Payer: Self-pay

## 2019-06-04 ENCOUNTER — Ambulatory Visit (HOSPITAL_COMMUNITY)
Admission: RE | Admit: 2019-06-04 | Discharge: 2019-06-04 | Disposition: A | Discharge status: Home / Self Care | Payer: Medicare Other | Source: Ambulatory Visit | Attending: Cardiovascular Disease | Admitting: Cardiovascular Disease

## 2019-06-04 ENCOUNTER — Encounter (HOSPITAL_COMMUNITY): Payer: Self-pay

## 2019-06-04 ENCOUNTER — Other Ambulatory Visit (HOSPITAL_COMMUNITY): Payer: Medicare Other

## 2019-06-04 ENCOUNTER — Encounter (HOSPITAL_COMMUNITY)
Admission: RE | Admit: 2019-06-04 | Discharge: 2019-06-04 | Disposition: A | Discharge status: Home / Self Care | Payer: Medicare Other | Source: Ambulatory Visit | Attending: Vascular Surgery | Admitting: Vascular Surgery

## 2019-06-04 DIAGNOSIS — F17200 Nicotine dependence, unspecified, uncomplicated: Secondary | ICD-10-CM | POA: Diagnosis not present

## 2019-06-04 DIAGNOSIS — E785 Hyperlipidemia, unspecified: Secondary | ICD-10-CM | POA: Diagnosis not present

## 2019-06-04 DIAGNOSIS — Z01818 Encounter for other preprocedural examination: Secondary | ICD-10-CM | POA: Insufficient documentation

## 2019-06-04 DIAGNOSIS — I739 Peripheral vascular disease, unspecified: Secondary | ICD-10-CM | POA: Diagnosis not present

## 2019-06-04 DIAGNOSIS — I6523 Occlusion and stenosis of bilateral carotid arteries: Secondary | ICD-10-CM | POA: Insufficient documentation

## 2019-06-04 DIAGNOSIS — R0989 Other specified symptoms and signs involving the circulatory and respiratory systems: Secondary | ICD-10-CM | POA: Diagnosis not present

## 2019-06-04 HISTORY — DX: Anxiety disorder, unspecified: F41.9

## 2019-06-04 LAB — COMPREHENSIVE METABOLIC PANEL
ALT: 22 U/L (ref 0–44)
AST: 19 U/L (ref 15–41)
Albumin: 3.8 g/dL (ref 3.5–5.0)
Alkaline Phosphatase: 83 U/L (ref 38–126)
Anion gap: 8 (ref 5–15)
BUN: 15 mg/dL (ref 8–23)
CO2: 28 mmol/L (ref 22–32)
Calcium: 9.1 mg/dL (ref 8.9–10.3)
Chloride: 100 mmol/L (ref 98–111)
Creatinine, Ser: 0.91 mg/dL (ref 0.61–1.24)
GFR calc Af Amer: >60 mL/min (ref >60)
GFR calc non Af Amer: >60 mL/min (ref >60)
Glucose, Bld: 89 mg/dL (ref 70–99)
Potassium: 4.1 mmol/L (ref 3.5–5.1)
Sodium: 136 mmol/L (ref 135–145)
Total Bilirubin: 1 mg/dL (ref 0.3–1.2)
Total Protein: 6.1 g/dL — ABNORMAL LOW (ref 6.5–8.1)

## 2019-06-04 LAB — CBC
HCT: 42.5 % (ref 39.0–52.0)
Hemoglobin: 14.7 g/dL (ref 13.0–17.0)
MCH: 31.9 pg (ref 26.0–34.0)
MCHC: 34.6 g/dL (ref 30.0–36.0)
MCV: 92.2 fL (ref 80.0–100.0)
Platelets: 206 K/uL (ref 150–400)
RBC: 4.61 MIL/uL (ref 4.22–5.81)
RDW: 12.3 % (ref 11.5–15.5)
WBC: 7.5 K/uL (ref 4.0–10.5)
nRBC: 0 % (ref 0.0–0.2)

## 2019-06-04 LAB — TYPE AND SCREEN
ABO/RH(D): O POS
Antibody Screen: NEGATIVE

## 2019-06-04 LAB — URINALYSIS, ROUTINE W REFLEX MICROSCOPIC
Bilirubin Urine: NEGATIVE
Glucose, UA: NEGATIVE mg/dL
Hgb urine dipstick: NEGATIVE
Ketones, ur: NEGATIVE mg/dL
Leukocytes,Ua: NEGATIVE
Nitrite: NEGATIVE
Protein, ur: NEGATIVE mg/dL
Specific Gravity, Urine: 1.009 (ref 1.005–1.030)
pH: 7 (ref 5.0–8.0)

## 2019-06-04 LAB — ABO/RH: ABO/RH(D): O POS

## 2019-06-04 LAB — APTT: aPTT: 29 seconds (ref 24–36)

## 2019-06-04 LAB — PROTIME-INR
INR: 1.1 (ref 0.8–1.2)
Prothrombin Time: 14 seconds (ref 11.4–15.2)

## 2019-06-04 LAB — SURGICAL PCR SCREEN
MRSA, PCR: NEGATIVE
Staphylococcus aureus: POSITIVE — AB

## 2019-06-04 MED ORDER — CHLORHEXIDINE GLUCONATE CLOTH 2 % EX PADS: 6.0000 | MEDICATED_PAD | Freq: Once | CUTANEOUS | Status: DC

## 2019-06-04 NOTE — Progress Notes (Signed)
[facility] called [facility] to leave m[facility]cin instruction.Also was on hold for some time. Pt was unable to get and he is very anxious over upcoming surgery. Please treat him DOS

## 2019-06-04 NOTE — Progress Notes (Signed)
PCP - none Cardiologist - none    Chest x-ray - na EKG - 3/21 Stress Test - na ECHO - 3/21 Cardiac Cath - 2/21      Blood Thinner Instructions:na Aspirin Instructions:stop    COVID TEST- for the 271th  Anesthesia review: ekg  Patient denies shortness of breath, fever, cough and chest pain at PAT appointment   All instructions explained to the patient, with a verbal understanding of the material. Patient agrees to go over the instructions while at home for a better understanding. Patient also instructed to self quarantine after being tested for COVID-19. The opportunity to ask questions was provided.

## 2019-06-05 ENCOUNTER — Ambulatory Visit (HOSPITAL_BASED_OUTPATIENT_CLINIC_OR_DEPARTMENT_OTHER)
Admission: RE | Admit: 2019-06-05 | Discharge: 2019-06-05 | Disposition: A | Discharge status: Home / Self Care | Payer: Medicare Other | Source: Ambulatory Visit | Attending: Cardiovascular Disease | Admitting: Cardiovascular Disease

## 2019-06-05 ENCOUNTER — Other Ambulatory Visit: Payer: Self-pay

## 2019-06-05 DIAGNOSIS — Z01810 Encounter for preprocedural cardiovascular examination: Secondary | ICD-10-CM | POA: Insufficient documentation

## 2019-06-05 LAB — MYOCARDIAL PERFUSION IMAGING
LV dias vol: 105 mL (ref 62–150)
LV sys vol: 47 mL
Peak HR: 88 {beats}/min
Rest HR: 57 {beats}/min
SDS: 1
SRS: 0
SSS: 1
TID: 1.07

## 2019-06-05 MED ORDER — TECHNETIUM TC 99M TETROFOSMIN IV KIT
10.6000 | PACK | Freq: Once | INTRAVENOUS | Status: AC | PRN
  Administered 2019-06-05: 10.6 via INTRAVENOUS
  Filled 2019-06-05: qty 11

## 2019-06-05 MED ORDER — REGADENOSON 0.4 MG/5ML IV SOLN
0.4000 mg | Freq: Once | INTRAVENOUS | Status: AC
  Administered 2019-06-05: 0.4 mg via INTRAVENOUS

## 2019-06-05 MED ORDER — TECHNETIUM TC 99M TETROFOSMIN IV KIT
30.7000 | PACK | Freq: Once | INTRAVENOUS | Status: AC | PRN
  Administered 2019-06-05: 30.7 via INTRAVENOUS
  Filled 2019-06-05: qty 31

## 2019-06-05 NOTE — Progress Notes (Signed)
Anesthesia Chart Review:   Case: [id number] Date/Time: 06/08/19 1117   Procedures:      INSERTION OF ILIAC STENT (Right )     BYPASS GRAFT FEMORAL-FEMORAL ARTERY (Right )   Anesthesia type: General   Pre-op diagnosis: ILIAC ARTERY OCCLUSION   Location: [facility] OR ROOM 7 / [facility]   Surgeons: Elam Dutch, MD      DISCUSSION:  Pt is an 86 year old with hx AAA (4.7cm by CT 03/03/19), PAD.  Current smoker for ~70 years.   - Saw Dr. Audie Box with cardiology 05/29/19 for pre-op eval; ordered echo and stress test plus carotid duplex for bruit.  Echo results below.  Stress test in progress Friday 3/26 this afternoon. Preliminary carotid US results show evidence for L subclavian syndrome. I notified Larene Beach in Dr. Oneida Alar' office of carotid results and that stress test still pending, pt not yet cleared by cardiology for surgery.    VS: BP (!) 160/71   Pulse 61   Temp 36.6 C (Oral)   Resp 20   Ht 5\' 10"  (1.778 m)   Wt 74 kg   SpO2 100%   BMI 23.40 kg/m    PROVIDERS: Patient, No Pcp Per  - Saw cardiologist Eleonore Chiquito 05/29/19 for pre-op evaluation. Echo, stress test, carotid duplex ordered, results below.    LABS: Labs reviewed: Acceptable for surgery. (all labs ordered are listed, but only abnormal results are displayed)  Labs Reviewed  SURGICAL PCR SCREEN - Abnormal; Notable for the following components:      Result Value   Staphylococcus aureus POSITIVE (*)    All other components within normal limits  COMPREHENSIVE METABOLIC PANEL - Abnormal; Notable for the following components:   Total Protein 6.1 (*)    All other components within normal limits  APTT  CBC  PROTIME-INR  URINALYSIS, ROUTINE W REFLEX MICROSCOPIC  TYPE AND SCREEN  ABO/RH     IMAGES:  CT angio abd/pelvis 03/03/19:  1. 4.7 cm infrarenal abdominal aortic aneurysm . 2. Celiac axis ostial stenosis , with widely patent SMA. 3. Left renal artery ostial stenosis of possible hemodynamic significance. 4.  Inferior mesenteric artery Short-segment origin occlusion, patent distally 5. RIGHT internal iliac artery origin occlusion. 6. Complex plaque and thrombus resulting in stenosis in the mid RIGHT external iliac artery. 7. Proximal LEFT external iliac artery occlusion extending through the common femoral artery. 8. Moderate stenosis in the mid RIGHT common femoral artery.  9. Cholelithiasis   EKG 05/29/19: sinus bradycardia   CV:  Nuclear stress test 06/05/19: test in progress.    Carotid duplex 06/04/19:  Preliminary report:  - Right Carotid: Velocities in the right ICA are consistent with a 1-39%  stenosis. Non-hemodynamically significant plaque <50% noted in the  CCA.  - Left Carotid: Velocities in the left ICA are consistent with a 1-39%  stenosis. The ECA appears >50% stenosed.  - Vertebrals: Right vertebral artery demonstrates antegrade flow. Left  vertebral artery demonstrates retrograde flow.  - Subclavians: Left subclavian artery was stenotic. Left subclavian artery  flow was disturbed. Normal flow hemodynamics were seen in the  right subclavian artery. There is a 34 mmHg difference in the  brachial blood pressure, RT>LT. There is evidence of left subclavian  steal syndrome.    Echo 06/02/19: 1.LV ejection fraction, by estimation, is 70 to 75%. LV has hyperdynamic function. LV has no regional wall motion abnormalities. There is mild LVH. LV diastolic  parameters are consistent with Grade I diastolic dysfunction (  impaired relaxation).  2. RV systolic function is normal. The right ventricular size is normal.  3. The mitral valve is normal in structure. No evidence of mitral valve regurgitation. No evidence of mitral stenosis.  4. The aortic valve was not well visualized. Aortic valve regurgitation is not visualized. No aortic stenosis is present.  5. The inferior vena cava is normal in size with greater than 50% respiratory variability, suggesting right atrial pressure of 3  mmHg.    Past Medical History:  Diagnosis Date  . AAA (abdominal aortic aneurysm) (HCC)   . Anxiety   . Arthritis   . BPH (benign prostatic hyperplasia)   . Dizziness   . Hyperlipidemia     Past Surgical History:  Procedure Laterality Date  . ABDOMINAL AORTOGRAM W/LOWER EXTREMITY N/A 05/06/2019   Procedure: ABDOMINAL AORTOGRAM W/LOWER EXTREMITY;  Surgeon: Cephus Shelling, MD;  Location: [facility] INVASIVE CV LAB;  Service: Cardiovascular;  Laterality: N/A;  . growth removed from chest    . HEMORRHOID SURGERY    . HERNIA REPAIR  12 yrs ago   left inguinal hernia  . INGUINAL HERNIA REPAIR  10/16/2010   Procedure: HERNIA REPAIR INGUINAL ADULT;  Surgeon: Dalia Heading;  Location: [facility] ORS;  Service: General;  Laterality: Right;    MEDICATIONS: . aspirin EC 81 MG tablet  . Multiple Vitamins-Minerals (MULTIVITAMIN WITH MINERALS) tablet  . rosuvastatin (CRESTOR) 10 MG tablet   No current facility-administered medications for this encounter.    Rica Mast, FNP-BC [facility]/Anesthesiology Phone: [phone] 06/05/2019 3:45 PM

## 2019-06-05 NOTE — Progress Notes (Signed)
Anesthesia follow-up: See note by Rica Mast, NP-C. Nuclear stress test results are now available.   Nuclear stress test 06/05/19:   The left ventricular ejection fraction is normal (55-65%).  Nuclear stress EF: 55%.  There was no ST segment deviation noted during stress.  Defect 1: There is a small defect of mild severity present in the apex location.  The study is normal.  This is a low risk study.   Normal stress nuclear study with mild apical thinning but no ischemia.  Gated ejection fraction 55% with normal wall motion.   Shonna Chock, PA-C Surgical Short Stay/Anesthesiology [facility] Phone [phone] [facility] Phone [phone] 06/05/2019 5:58 PM

## 2019-06-05 NOTE — Anesthesia Preprocedure Evaluation (Addendum)
Anesthesia Evaluation  Patient identified by MRN, date of birth, ID band Patient awake    Reviewed: Allergy & Precautions, NPO status , Patient's Chart, lab work & pertinent test results  Airway Mallampati: II  TM Distance: >3 FB     Dental  (+) Dental Advisory Given   Pulmonary Current Smoker and Patient abstained from smoking.,    breath sounds clear to auscultation       Cardiovascular + Peripheral Vascular Disease   Rhythm:Regular Rate:Normal     Neuro/Psych negative neurological ROS     GI/Hepatic negative GI ROS, Neg liver ROS,   Endo/Other  negative endocrine ROS  Renal/GU negative Renal ROS     Musculoskeletal  (+) Arthritis ,   Abdominal   Peds  Hematology negative hematology ROS (+)   Anesthesia Other Findings   Reproductive/Obstetrics                             Anesthesia Physical Anesthesia Plan  ASA: III  Anesthesia Plan: General   Post-op Pain Management:    Induction: Intravenous  PONV Risk Score and Plan: 1 and Ondansetron, Dexamethasone and Treatment may vary due to age or medical condition  Airway Management Planned: Oral ETT  Additional Equipment: Arterial line  Intra-op Plan:   Post-operative Plan: Extubation in OR  Informed Consent: I have reviewed the patients History and Physical, chart, labs and discussed the procedure including the risks, benefits and alternatives for the proposed anesthesia with the patient or authorized representative who has indicated his/her understanding and acceptance.     Dental advisory given  Plan Discussed with:   Anesthesia Plan Comments:        Anesthesia Quick Evaluation

## 2019-06-06 ENCOUNTER — Other Ambulatory Visit (HOSPITAL_COMMUNITY)
Admission: RE | Admit: 2019-06-06 | Discharge: 2019-06-06 | Disposition: A | Discharge status: Home / Self Care | Payer: Medicare Other | Source: Ambulatory Visit | Attending: Vascular Surgery | Admitting: Vascular Surgery

## 2019-06-06 LAB — SARS CORONAVIRUS 2 (TAT 6-24 HRS): SARS Coronavirus 2: NEGATIVE

## 2019-06-08 ENCOUNTER — Encounter (HOSPITAL_COMMUNITY)
Admission: RE | Disposition: A | Discharge status: Home / Self Care | Payer: Self-pay | Source: Home / Self Care | Attending: Vascular Surgery

## 2019-06-08 ENCOUNTER — Inpatient Hospital Stay (HOSPITAL_COMMUNITY)
Admission: RE | Admit: 2019-06-08 | Discharge: 2019-06-10 | DRG: 271 | Disposition: A | Discharge status: Home / Self Care | Payer: Medicare Other | Attending: Vascular Surgery | Admitting: Vascular Surgery

## 2019-06-08 ENCOUNTER — Inpatient Hospital Stay (HOSPITAL_COMMUNITY): Payer: Medicare Other

## 2019-06-08 ENCOUNTER — Inpatient Hospital Stay (HOSPITAL_COMMUNITY): Payer: Medicare Other | Admitting: Emergency Medicine

## 2019-06-08 ENCOUNTER — Inpatient Hospital Stay (HOSPITAL_COMMUNITY): Payer: Medicare Other | Admitting: Certified Registered Nurse Anesthetist

## 2019-06-08 ENCOUNTER — Encounter (HOSPITAL_COMMUNITY): Payer: Self-pay | Admitting: Vascular Surgery

## 2019-06-08 ENCOUNTER — Other Ambulatory Visit: Payer: Self-pay

## 2019-06-08 DIAGNOSIS — I959 Hypotension, unspecified: Secondary | ICD-10-CM | POA: Diagnosis not present

## 2019-06-08 DIAGNOSIS — F17210 Nicotine dependence, cigarettes, uncomplicated: Secondary | ICD-10-CM | POA: Diagnosis present

## 2019-06-08 DIAGNOSIS — Z20822 Contact with and (suspected) exposure to covid-19: Secondary | ICD-10-CM | POA: Diagnosis present

## 2019-06-08 DIAGNOSIS — I745 Embolism and thrombosis of iliac artery: Secondary | ICD-10-CM | POA: Diagnosis present

## 2019-06-08 DIAGNOSIS — Z95828 Presence of other vascular implants and grafts: Secondary | ICD-10-CM

## 2019-06-08 DIAGNOSIS — I739 Peripheral vascular disease, unspecified: Principal | ICD-10-CM | POA: Diagnosis present

## 2019-06-08 DIAGNOSIS — I714 Abdominal aortic aneurysm, without rupture: Secondary | ICD-10-CM | POA: Diagnosis present

## 2019-06-08 DIAGNOSIS — R001 Bradycardia, unspecified: Secondary | ICD-10-CM | POA: Diagnosis not present

## 2019-06-08 DIAGNOSIS — Z7289 Other problems related to lifestyle: Secondary | ICD-10-CM

## 2019-06-08 DIAGNOSIS — I70213 Atherosclerosis of native arteries of extremities with intermittent claudication, bilateral legs: Secondary | ICD-10-CM

## 2019-06-08 DIAGNOSIS — G8918 Other acute postprocedural pain: Secondary | ICD-10-CM | POA: Diagnosis not present

## 2019-06-08 HISTORY — PX: INSERTION OF ILIAC STENT: SHX6256

## 2019-06-08 HISTORY — PX: ENDARTERECTOMY FEMORAL: SHX5804

## 2019-06-08 HISTORY — PX: PATCH ANGIOPLASTY: SHX6230

## 2019-06-08 HISTORY — PX: FEMORAL-FEMORAL BYPASS GRAFT: SHX936

## 2019-06-08 LAB — CBC
HCT: 30.2 % — ABNORMAL LOW (ref 39.0–52.0)
Hemoglobin: 10.8 g/dL — ABNORMAL LOW (ref 13.0–17.0)
MCH: 32 pg (ref 26.0–34.0)
MCHC: 35.8 g/dL (ref 30.0–36.0)
MCV: 89.6 fL (ref 80.0–100.0)
Platelets: 163 10*3/uL (ref 150–400)
RBC: 3.37 MIL/uL — ABNORMAL LOW (ref 4.22–5.81)
RDW: 12.1 % (ref 11.5–15.5)
WBC: 12.8 10*3/uL — ABNORMAL HIGH (ref 4.0–10.5)
nRBC: 0 % (ref 0.0–0.2)

## 2019-06-08 LAB — POCT ACTIVATED CLOTTING TIME
Activated Clotting Time: 356 seconds
Activated Clotting Time: 290 seconds

## 2019-06-08 SURGERY — INSERTION, STENT, ARTERY, ILIAC
Anesthesia: General | Site: Groin | Laterality: Right

## 2019-06-08 MED ORDER — EPHEDRINE SULFATE-NACL 50-0.9 MG/10ML-% IV SOSY
PREFILLED_SYRINGE | INTRAVENOUS | Status: DC | PRN
  Administered 2019-06-08 (×3): 5 mg via INTRAVENOUS

## 2019-06-08 MED ORDER — FENTANYL CITRATE (PF) 100 MCG/2ML IJ SOLN: 25.0000 ug | INTRAMUSCULAR | Status: DC | PRN

## 2019-06-08 MED ORDER — ACETAMINOPHEN 325 MG RE SUPP: 325.0000 mg | RECTAL | Status: DC | PRN

## 2019-06-08 MED ORDER — HYDROMORPHONE HCL 1 MG/ML IJ SOLN
INTRAMUSCULAR | Status: DC | PRN
  Administered 2019-06-08 (×2): .5 mg via INTRAVENOUS

## 2019-06-08 MED ORDER — DEXAMETHASONE SODIUM PHOSPHATE 10 MG/ML IJ SOLN
INTRAMUSCULAR | Status: DC | PRN
  Administered 2019-06-08: 4 mg via INTRAVENOUS

## 2019-06-08 MED ORDER — ASPIRIN EC 81 MG PO TBEC
81.0000 mg | DELAYED_RELEASE_TABLET | Freq: Every day | ORAL | Status: DC
  Administered 2019-06-09 – 2019-06-10 (×2): 81 mg via ORAL
  Filled 2019-06-08 (×3): qty 1

## 2019-06-08 MED ORDER — LIDOCAINE 2% (20 MG/ML) 5 ML SYRINGE
INTRAMUSCULAR | Status: DC | PRN
  Administered 2019-06-08: 40 mg via INTRAVENOUS
  Administered 2019-06-08: 60 mg via INTRAVENOUS

## 2019-06-08 MED ORDER — MUPIROCIN 2 % EX OINT
1.0000 "application " | TOPICAL_OINTMENT | Freq: Once | CUTANEOUS | Status: AC
  Administered 2019-06-08: 1 via TOPICAL
  Filled 2019-06-08: qty 22

## 2019-06-08 MED ORDER — SUGAMMADEX SODIUM 200 MG/2ML IV SOLN
INTRAVENOUS | Status: DC | PRN
  Administered 2019-06-08: 100 mg via INTRAVENOUS

## 2019-06-08 MED ORDER — ACETAMINOPHEN 325 MG PO TABS: 325.0000 mg | ORAL_TABLET | ORAL | Status: DC | PRN

## 2019-06-08 MED ORDER — CLOPIDOGREL BISULFATE 75 MG PO TABS
75.0000 mg | ORAL_TABLET | Freq: Every day | ORAL | Status: DC
  Administered 2019-06-09 – 2019-06-10 (×2): 75 mg via ORAL
  Filled 2019-06-08 (×2): qty 1

## 2019-06-08 MED ORDER — GUAIFENESIN-DM 100-10 MG/5ML PO SYRP: 15.0000 mL | ORAL_SOLUTION | ORAL | Status: DC | PRN

## 2019-06-08 MED ORDER — HEPARIN SODIUM (PORCINE) 1000 UNIT/ML IJ SOLN
INTRAMUSCULAR | Status: AC
  Filled 2019-06-08: qty 3

## 2019-06-08 MED ORDER — HEPARIN SODIUM (PORCINE) 1000 UNIT/ML IJ SOLN
INTRAMUSCULAR | Status: DC | PRN
  Administered 2019-06-08: 5000 [IU] via INTRAVENOUS
  Administered 2019-06-08 (×4): 8000 [IU] via INTRAVENOUS

## 2019-06-08 MED ORDER — CEFAZOLIN SODIUM-DEXTROSE 2-4 GM/100ML-% IV SOLN
2.0000 g | Freq: Three times a day (TID) | INTRAVENOUS | Status: AC
  Administered 2019-06-08 – 2019-06-09 (×2): 2 g via INTRAVENOUS
  Filled 2019-06-08 (×2): qty 100

## 2019-06-08 MED ORDER — SODIUM CHLORIDE 0.9 % IV SOLN: 500.0000 mL | Freq: Once | INTRAVENOUS | Status: DC | PRN

## 2019-06-08 MED ORDER — HYDRALAZINE HCL 20 MG/ML IJ SOLN: 5.0000 mg | INTRAMUSCULAR | Status: DC | PRN

## 2019-06-08 MED ORDER — MORPHINE SULFATE (PF) 2 MG/ML IV SOLN: 2.0000 mg | INTRAVENOUS | Status: DC | PRN

## 2019-06-08 MED ORDER — PROPOFOL 10 MG/ML IV BOLUS
INTRAVENOUS | Status: AC
  Filled 2019-06-08: qty 20

## 2019-06-08 MED ORDER — DOCUSATE SODIUM 100 MG PO CAPS
100.0000 mg | ORAL_CAPSULE | Freq: Every day | ORAL | Status: DC
  Administered 2019-06-09 – 2019-06-10 (×2): 100 mg via ORAL
  Filled 2019-06-08 (×2): qty 1

## 2019-06-08 MED ORDER — ESMOLOL HCL 100 MG/10ML IV SOLN
INTRAVENOUS | Status: DC | PRN
  Administered 2019-06-08: 30 mg via INTRAVENOUS

## 2019-06-08 MED ORDER — SODIUM CHLORIDE 0.9 % IV BOLUS
1000.0000 mL | Freq: Once | INTRAVENOUS | Status: AC
  Administered 2019-06-08: 1000 mL via INTRAVENOUS

## 2019-06-08 MED ORDER — ZOLPIDEM TARTRATE 5 MG PO TABS: 5.0000 mg | ORAL_TABLET | Freq: Every evening | ORAL | Status: DC | PRN

## 2019-06-08 MED ORDER — LACTATED RINGERS IV SOLN: INTRAVENOUS | Status: DC

## 2019-06-08 MED ORDER — ROCURONIUM BROMIDE 10 MG/ML (PF) SYRINGE
PREFILLED_SYRINGE | INTRAVENOUS | Status: AC
  Filled 2019-06-08: qty 10

## 2019-06-08 MED ORDER — SODIUM CHLORIDE 0.9 % IV SOLN
INTRAVENOUS | Status: DC | PRN
  Administered 2019-06-08 (×2): 500 mL

## 2019-06-08 MED ORDER — PHENYLEPHRINE HCL-NACL 10-0.9 MG/250ML-% IV SOLN
INTRAVENOUS | Status: DC | PRN
  Administered 2019-06-08: 20 ug/min via INTRAVENOUS

## 2019-06-08 MED ORDER — LABETALOL HCL 5 MG/ML IV SOLN: 10.0000 mg | INTRAVENOUS | Status: DC | PRN

## 2019-06-08 MED ORDER — 0.9 % SODIUM CHLORIDE (POUR BTL) OPTIME
TOPICAL | Status: DC | PRN
  Administered 2019-06-08 (×2): 1000 mL

## 2019-06-08 MED ORDER — CLOPIDOGREL BISULFATE 75 MG PO TABS
300.0000 mg | ORAL_TABLET | Freq: Once | ORAL | Status: AC
  Administered 2019-06-08: 300 mg via ORAL
  Filled 2019-06-08: qty 4

## 2019-06-08 MED ORDER — MAGNESIUM SULFATE 2 GM/50ML IV SOLN: 2.0000 g | Freq: Every day | INTRAVENOUS | Status: DC | PRN

## 2019-06-08 MED ORDER — ROSUVASTATIN CALCIUM 5 MG PO TABS
10.0000 mg | ORAL_TABLET | Freq: Every day | ORAL | Status: DC
  Administered 2019-06-09: 10 mg via ORAL
  Filled 2019-06-08 (×2): qty 2

## 2019-06-08 MED ORDER — PROMETHAZINE HCL 25 MG/ML IJ SOLN: 6.2500 mg | INTRAMUSCULAR | Status: DC | PRN

## 2019-06-08 MED ORDER — SODIUM CHLORIDE 0.9 % IV SOLN
INTRAVENOUS | Status: AC
  Filled 2019-06-08: qty 1.2

## 2019-06-08 MED ORDER — ONDANSETRON HCL 4 MG/2ML IJ SOLN
INTRAMUSCULAR | Status: AC
  Filled 2019-06-08: qty 2

## 2019-06-08 MED ORDER — ROCURONIUM BROMIDE 10 MG/ML (PF) SYRINGE
PREFILLED_SYRINGE | INTRAVENOUS | Status: DC | PRN
  Administered 2019-06-08: 20 mg via INTRAVENOUS
  Administered 2019-06-08 (×2): 40 mg via INTRAVENOUS

## 2019-06-08 MED ORDER — FENTANYL CITRATE (PF) 250 MCG/5ML IJ SOLN
INTRAMUSCULAR | Status: DC | PRN
  Administered 2019-06-08 (×2): 50 ug via INTRAVENOUS
  Administered 2019-06-08 (×2): 25 ug via INTRAVENOUS
  Administered 2019-06-08 (×2): 50 ug via INTRAVENOUS

## 2019-06-08 MED ORDER — PROPOFOL 10 MG/ML IV BOLUS
INTRAVENOUS | Status: DC | PRN
  Administered 2019-06-08: 120 mg via INTRAVENOUS
  Administered 2019-06-08: 50 mg via INTRAVENOUS

## 2019-06-08 MED ORDER — POTASSIUM CHLORIDE CRYS ER 20 MEQ PO TBCR: 20.0000 meq | EXTENDED_RELEASE_TABLET | Freq: Every day | ORAL | Status: DC | PRN

## 2019-06-08 MED ORDER — IODIXANOL 320 MG/ML IV SOLN
INTRAVENOUS | Status: DC | PRN
  Administered 2019-06-08: 83 mL via INTRA_ARTERIAL

## 2019-06-08 MED ORDER — SODIUM CHLORIDE 0.9 % IV SOLN: INTRAVENOUS | Status: DC

## 2019-06-08 MED ORDER — PANTOPRAZOLE SODIUM 40 MG PO TBEC
40.0000 mg | DELAYED_RELEASE_TABLET | Freq: Every day | ORAL | Status: DC
  Administered 2019-06-09 – 2019-06-10 (×3): 40 mg via ORAL
  Filled 2019-06-08 (×3): qty 1

## 2019-06-08 MED ORDER — HYDROMORPHONE HCL 1 MG/ML IJ SOLN
INTRAMUSCULAR | Status: AC
  Filled 2019-06-08: qty 0.5

## 2019-06-08 MED ORDER — METOPROLOL TARTRATE 5 MG/5ML IV SOLN: 2.0000 mg | INTRAVENOUS | Status: DC | PRN

## 2019-06-08 MED ORDER — HEPARIN SODIUM (PORCINE) 5000 UNIT/ML IJ SOLN
5000.0000 [IU] | Freq: Three times a day (TID) | INTRAMUSCULAR | Status: DC
  Administered 2019-06-08 – 2019-06-10 (×5): 5000 [IU] via SUBCUTANEOUS
  Filled 2019-06-08 (×5): qty 1

## 2019-06-08 MED ORDER — HEMOSTATIC AGENTS (NO CHARGE) OPTIME
TOPICAL | Status: DC | PRN
  Administered 2019-06-08: 1 via TOPICAL

## 2019-06-08 MED ORDER — PHENOL 1.4 % MT LIQD: 1.0000 | OROMUCOSAL | Status: DC | PRN

## 2019-06-08 MED ORDER — ONDANSETRON HCL 4 MG/2ML IJ SOLN
INTRAMUSCULAR | Status: DC | PRN
  Administered 2019-06-08: 4 mg via INTRAVENOUS

## 2019-06-08 MED ORDER — FENTANYL CITRATE (PF) 250 MCG/5ML IJ SOLN
INTRAMUSCULAR | Status: AC
  Filled 2019-06-08: qty 5

## 2019-06-08 MED ORDER — SENNOSIDES-DOCUSATE SODIUM 8.6-50 MG PO TABS: 1.0000 | ORAL_TABLET | Freq: Every evening | ORAL | Status: DC | PRN

## 2019-06-08 MED ORDER — ALUM & MAG HYDROXIDE-SIMETH 200-200-20 MG/5ML PO SUSP: 15.0000 mL | ORAL | Status: DC | PRN

## 2019-06-08 MED ORDER — LIDOCAINE 2% (20 MG/ML) 5 ML SYRINGE
INTRAMUSCULAR | Status: AC
  Filled 2019-06-08: qty 5

## 2019-06-08 MED ORDER — ONDANSETRON HCL 4 MG/2ML IJ SOLN: 4.0000 mg | Freq: Four times a day (QID) | INTRAMUSCULAR | Status: DC | PRN

## 2019-06-08 MED ORDER — OXYCODONE HCL 5 MG PO TABS
5.0000 mg | ORAL_TABLET | ORAL | Status: DC | PRN
  Administered 2019-06-10: 10 mg via ORAL
  Filled 2019-06-08: qty 2

## 2019-06-08 MED ORDER — GLYCOPYRROLATE PF 0.2 MG/ML IJ SOSY
PREFILLED_SYRINGE | INTRAMUSCULAR | Status: AC
  Filled 2019-06-08: qty 1

## 2019-06-08 MED ORDER — VANCOMYCIN HCL IN DEXTROSE 1-5 GM/200ML-% IV SOLN
1000.0000 mg | INTRAVENOUS | Status: AC
  Administered 2019-06-08: 1000 mg via INTRAVENOUS
  Filled 2019-06-08: qty 200

## 2019-06-08 SURGICAL SUPPLY — 76 items
ADH SKN CLS APL DERMABOND .7 (GAUZE/BANDAGES/DRESSINGS) ×6
AGENT HMST SPONGE THK3/8 (HEMOSTASIS)
BAG ISL DRAPE 18X18 STRL (DRAPES) ×2
BAG ISOLATION DRAPE 18X18 (DRAPES) ×2 IMPLANT
BALLN MUSTANG 8.0X40 75 (BALLOONS) ×3
BALLOON MUSTANG 8.0X40 75 (BALLOONS) IMPLANT
CANISTER SUCT 3000ML PPV (MISCELLANEOUS) ×3 IMPLANT
CANNULA VESSEL 3MM 2 BLNT TIP (CANNULA) ×6 IMPLANT
CATH EMB 4FR 80CM (CATHETERS) ×1 IMPLANT
CATH OMNI FLUSH .035X70CM (CATHETERS) ×1 IMPLANT
CATH OMNI FLUSH 5F 65CM (CATHETERS) ×3 IMPLANT
CLIP VESOCCLUDE MED 24/CT (CLIP) ×3 IMPLANT
CLIP VESOCCLUDE SM WIDE 24/CT (CLIP) ×3 IMPLANT
DERMABOND ADVANCED (GAUZE/BANDAGES/DRESSINGS) ×3
DERMABOND ADVANCED .7 DNX12 (GAUZE/BANDAGES/DRESSINGS) IMPLANT
DEVICE TORQUE KENDALL .025-038 (MISCELLANEOUS) ×2 IMPLANT
DRAIN HEMOVAC 1/8 X 5 (WOUND CARE) IMPLANT
DRAPE ISOLATION BAG 18X18 (DRAPES) ×3
DRAPE TABLE BACK 80X90 (DRAPES) ×1 IMPLANT
ELECT REM PT RETURN 9FT ADLT (ELECTROSURGICAL) ×3
ELECTRODE REM PT RTRN 9FT ADLT (ELECTROSURGICAL) ×2 IMPLANT
EVACUATOR SILICONE 100CC (DRAIN) IMPLANT
GAUZE 4X4 16PLY RFD (DISPOSABLE) IMPLANT
GAUZE SPONGE 4X4 16PLY XRAY LF (GAUZE/BANDAGES/DRESSINGS) ×1 IMPLANT
GLOVE BIO SURGEON STRL SZ7.5 (GLOVE) ×3 IMPLANT
GLOVE BIOGEL PI IND STRL 8 (GLOVE) ×2 IMPLANT
GLOVE BIOGEL PI INDICATOR 8 (GLOVE) ×1
GOWN STRL REUS W/ TWL LRG LVL3 (GOWN DISPOSABLE) ×6 IMPLANT
GOWN STRL REUS W/TWL LRG LVL3 (GOWN DISPOSABLE) ×9
GRAFT HEMASHIELD 8MM (Vascular Products) ×3 IMPLANT
GRAFT VASC STRG 30X8KNIT (Vascular Products) IMPLANT
GUIDEWIRE ANGLED .035X150CM (WIRE) ×1 IMPLANT
HEMOSTAT SPONGE AVITENE ULTRA (HEMOSTASIS) IMPLANT
KIT BASIN OR (CUSTOM PROCEDURE TRAY) ×3 IMPLANT
KIT ENCORE 26 ADVANTAGE (KITS) ×1 IMPLANT
KIT TURNOVER KIT B (KITS) ×3 IMPLANT
LOOP VESSEL MAXI BLUE (MISCELLANEOUS) ×1 IMPLANT
LOOP VESSEL MINI RED (MISCELLANEOUS) ×2 IMPLANT
NS IRRIG 1000ML POUR BTL (IV SOLUTION) ×6 IMPLANT
PACK ENDO MINOR (CUSTOM PROCEDURE TRAY) ×3 IMPLANT
PACK PERIPHERAL VASCULAR (CUSTOM PROCEDURE TRAY) ×3 IMPLANT
PAD ARMBOARD 7.5X6 YLW CONV (MISCELLANEOUS) ×6 IMPLANT
PATCH HEMASHIELD 8X75 (Vascular Products) ×1 IMPLANT
SET MICROPUNCTURE 5F STIFF (MISCELLANEOUS) IMPLANT
SHEATH BRITE TIP 8FR 23CM (SHEATH) ×2 IMPLANT
SHEATH BRITE TIP 8FR 35CM (SHEATH) ×1 IMPLANT
SHEATH PINNACLE 5F 10CM (SHEATH) ×5 IMPLANT
SHEATH PINNACLE 8F 10CM (SHEATH) ×1 IMPLANT
SPONGE INTESTINAL PEANUT (DISPOSABLE) ×1 IMPLANT
STAPLER VISISTAT 35W (STAPLE) IMPLANT
STENT INNOVA 8X40X130 (Permanent Stent) ×1 IMPLANT
STENT VIABAHN VBX 8X59X135 (Permanent Stent) ×2 IMPLANT
STENT VIABAHNBX 9X39X135 (Permanent Stent) ×1 IMPLANT
SUT PROLENE 5 0 C 1 24 (SUTURE) ×8 IMPLANT
SUT PROLENE 6 0 CC (SUTURE) ×4 IMPLANT
SUT SILK 2 0 SH (SUTURE) ×1 IMPLANT
SUT SILK 3 0 (SUTURE) ×3
SUT SILK 3-0 18XBRD TIE 12 (SUTURE) IMPLANT
SUT VIC AB 2-0 SH 27 (SUTURE) ×6
SUT VIC AB 2-0 SH 27XBRD (SUTURE) ×4 IMPLANT
SUT VIC AB 3-0 SH 27 (SUTURE) ×6
SUT VIC AB 3-0 SH 27X BRD (SUTURE) ×4 IMPLANT
SUT VICRYL 4-0 PS2 18IN ABS (SUTURE) ×6 IMPLANT
SYR 10ML LL (SYRINGE) ×4 IMPLANT
SYR 3ML LL SCALE MARK (SYRINGE) ×1 IMPLANT
SYR MEDRAD MARK V 150ML (SYRINGE) IMPLANT
TAPE UMBILICAL COTTON 1/8X30 (MISCELLANEOUS) IMPLANT
TOWEL GREEN STERILE (TOWEL DISPOSABLE) ×3 IMPLANT
TRAY FOLEY MTR SLVR 16FR STAT (SET/KITS/TRAYS/PACK) ×3 IMPLANT
TUBING HIGH PRESSURE 120CM (CONNECTOR) IMPLANT
UNDERPAD 30X30 (UNDERPADS AND DIAPERS) ×3 IMPLANT
WATER STERILE IRR 1000ML POUR (IV SOLUTION) ×3 IMPLANT
WIRE AMPLATZ SS-J .035X260CM (WIRE) ×1 IMPLANT
WIRE BENTSON .035X145CM (WIRE) ×3 IMPLANT
WIRE EMERALD 3MM-J .035X150CM (WIRE) ×2 IMPLANT
WIRE ROSEN-J .035X260CM (WIRE) ×1 IMPLANT

## 2019-06-08 NOTE — Anesthesia Procedure Notes (Signed)
Procedure Name: Intubation Performed by: Tawnie Ehresman H, CRNA Pre-anesthesia Checklist: Patient identified, Emergency Drugs available, Suction available and Patient being monitored Patient Re-evaluated:Patient Re-evaluated prior to induction Oxygen Delivery Method: Circle System Utilized Preoxygenation: Pre-oxygenation with 100% oxygen Induction Type: IV induction Ventilation: Mask ventilation without difficulty Laryngoscope Size: Mac and 3 Grade View: Grade I Tube type: Oral Tube size: 7.5 mm Number of attempts: 1 Airway Equipment and Method: Stylet and Oral airway Placement Confirmation: ETT inserted through vocal cords under direct vision,  positive ETCO2 and breath sounds checked- equal and bilateral Secured at: 22 cm Tube secured with: Tape Dental Injury: Teeth and Oropharynx as per pre-operative assessment        

## 2019-06-08 NOTE — Progress Notes (Signed)
Patient to room 4E13 from PACU. Vital signs obtained. CHG bath completed. Monitor on. CCMD notified.  Ginette Otto, RN

## 2019-06-08 NOTE — Op Note (Signed)
Procedure: Right common femoral external iliac endarterectomy, right common (9 x 39 VBX) and external iliac stent 8 x 59 VBX, 8 x 40 Inova, right to left femoral-femoral bypass, left external iliac common femoral endarterectomy, patch angioplasty right common femoral artery Dacron  Preoperative diagnosis: Claudication  Postoperative diagnosis: Same  Anesthesia: General  Assistant: Lucretia Roers, PA-C  Operative findings: 1.  Right common iliac and external iliac artery stented with 0% residual stenosis  8 mm dacron graft for femoral-femoral bypass  Operative details: After team informed consent, the patient taken the operating.  The patient was placed in supine position operating table.  After induction general anesthesia and endotracheal intubation patient was prepped and draped in usual sterile fashion from the umbilicus to the toes.  Next a longitudinal incision was made in the right groin carried on through subcutaneous tissues down the right common femoral artery.  This was heavily calcified.  There was no pulse within it.  Profunda femoris was dissected free circumferentially it was very calcified at its origin so I dissected down to the first branch point there were 3 large branches and Vesseloops were placed around all of these.  Superficial femoral artery was thickened but there was a soft area about 2 cm up to the origin.  A vessel loop was placed in this location.  Circumflex iliac branches were dissected free circumferentially and Vesseloops placed around these.  Vesseloops also placed around the distal external iliac artery.  Next patient was given 7000 units of intravenous heparin.  He was given several additional doses with following of a CTs during the course of the case.  Longitudinal opening was made in the right common femoral artery.  There was severe calcific plaque with total occlusion of the common femoral artery.  Endarterectomy was begun in a suitable plane in the mid common  femoral artery.  I was able to get a feathered endpoint into the profunda and superficial femoral arteries.  Endarterectomy was then carried up to underneath the inguinal ligament into the right external iliac artery.  I had a fairly reasonable proximal endpoint as well although there were some jagged edges.  At this point a Dacron patch was brought up in the operative field and a patch angioplasty was performed the right common femoral artery using a running 6-0 Prolene suture.  Despite completion anastomosis it was for blood backbled and thoroughly flushed.  Anastomosis was secured clamps released there is palpable pulse in the right common femoral artery immediately.  Next an introducer needle was used to cannulate the right common femoral artery through the patch.  An 035 J-wire was advanced into the distal right external iliac artery.  I could not get the guidewire to advance all the way into the aorta.  Therefore a 5 French sheath was then advanced over the guidewire into the distal external iliac artery.  Contrast angiogram was performed which showed diffusely stenosed distal right external iliac artery with some tortuosity.  Using this as a roadmap I was able to get an 035 angled Glidewire all the way up into the abdominal aorta.  I then advanced the 5 French sheath over this.  A 5 French Omni Flush catheter was then advanced over this and a contrast angiogram was obtained.  This shows patent infrarenal abdominal aorta left and right common iliac arteries proximally.  However the sheath was occlusive in the distal right common iliac as well as the right external iliac artery.  At this point the 5 [place] sheath  was swapped over an [employer] wire for an 8 [place] sheath.  I then delivered a 8 x 59 VBX stent to the distal right external iliac artery and carried this up into the mid portion of the right common iliac artery.  Completion angiogram showed that there was still some stenosis in the common iliac artery  above this.  So the stent was extended with an additional 9 x 39 VBX stent.  These were both deployed to nominal pressure.  Completion angiogram at this point still showed some flaps and stenosis of the distal external iliac artery just above the area of the patch angioplasty.  So I placed an 8 x 40 Inova self-expanding stent in this location.  Unfortunately the stent jumped forward significantly and did not completely cover the area of intimal flap.  I did go ahead and post dilate the stent to 8 mm.  I then brought the balloon down to the junction of the patch angioplasty and distal external iliac artery and performed an additional angioplasty with an 8 mm x 40 balloon and inflated this for 2 minutes.  Completion angiogram then showed wide patency of the right external iliac artery into the common femoral artery.  The sheath was then removed as well as the guidewire and the hole repaired with a 5-0 Prolene suture.  Attention was then turned to the left groin.  Incision was carried down through subtenons tissues in longitudinal fashion down the left common femoral artery.  This was dissected free circumferentially.  Circumflex iliac branch on the lateral aspect as well as the distal external iliac artery were dissected free circumferentially and Vesseloops placed around these.  This artery was also heavily calcified and was occluded with no pulse within it.  Dissection was carried down to the level of the superficial femoral artery.  There was a soft spot about a centimeter into the origin.  A vessel loop was placed here.  Profunda femoris artery had 2 large branches and Vesseloops were placed around both of these.  Longitude opening was made in the left common femoral artery.  There was fresh thrombus within it and a severely narrowed calcified stenosis in the left common femoral artery.  There was no antegrade flow coming down the left external iliac artery.  I performed an endarterectomy up underneath the  inguinal ligament and the external iliac artery to give plenty of room to complete the anastomosis on the left side.  The endarterectomy was then carried down and the origin of the right superficial femoral artery about 2 cm to obtain a good distal endpoint.  There was a good endpoint at the origin of the left profunda.  Both of these backbled well.  This point an 8 mm dacryon graft was tunneled subcutaneously from the left groin over the right groin.  Due to the 5 cm in length arteriotomy in the left common femoral artery a long spatulation was performed on 8 mm dacryon graft and this was sewn endograft to side of artery using a running 5-0 Prolene suture.  Despite completion anastomosis it was for blood backbled and thoroughly flushed.  Anastomosis was secured clamps released and the graft was then clamped just above this.  The graft was pulled taut the length and the right common femoral artery again recontrolled proximally distally.  Longitudinal opening was made in the previous patch.  The graft was cut to length and beveled and sewn endograft to side of patch in the right groin using a running  5-0 Prolene suture.  Despite completion anastomosis it was for blood backbled thoroughly flushed reanastomosed was secured clamps released was pulsatile flow in both groins immediately.  Patient had a dorsalis pedis Doppler signal in the right foot.  He had a peroneal Doppler signal in the left foot.  There was good Doppler flow which was biphasic in character into the superficial femoral and profunda femoris arteries bilaterally.  At this point hemostasis was obtained with 50 mg of protamine.  Hemoblast was also used.  Both groins were then closed with multiple layers of running 2-0 and 3-0 Vicryl suture with a 4-0 Vicryl subcuticular stitch in the skin.  Dermabond was applied to both incisions.  Patient tolerated procedure well and there were no complications.  The incident sponge needle counts correct in the case.   The patient was taken to recovery in stable condition.  Fabienne Bruns, MD [facility] Office: [phone]

## 2019-06-08 NOTE — Interval H&P Note (Signed)
History and Physical Interval Note:  06/08/2019 10:04 AM  Edward Campos  has presented today for surgery, with the diagnosis of ILIAC ARTERY OCCLUSION.  The various methods of treatment have been discussed with the patient and family. After consideration of risks, benefits and other options for treatment, the patient has consented to  Procedure(s): INSERTION OF ILIAC STENT (Right) BYPASS GRAFT FEMORAL-FEMORAL ARTERY (Right) as a surgical intervention.  The patient's history has been reviewed, patient examined, no change in status, stable for surgery.  I have reviewed the patient's chart and labs.  Questions were answered to the patient's satisfaction.     Fabienne Bruns

## 2019-06-08 NOTE — Interval H&P Note (Signed)
History and Physical Interval Note:  06/08/2019 10:07 AM  Edward Campos  has presented today for surgery, with the diagnosis of ILIAC ARTERY OCCLUSION.  The various methods of treatment have been discussed with the patient and family. After consideration of risks, benefits and other options for treatment, the patient has consented to  Procedure(s): INSERTION OF ILIAC STENT (Right) BYPASS GRAFT FEMORAL-FEMORAL ARTERY (Right) as a surgical intervention.  The patient's history has been reviewed, patient examined, no change in status, stable for surgery.  I have reviewed the patient's chart and labs.  Questions were answered to the patient's satisfaction.     Fabienne Bruns

## 2019-06-08 NOTE — Anesthesia Procedure Notes (Signed)
Arterial Line Insertion Start/End3/29/2021 10:15 AM, 06/08/2019 10:25 AM Performed by: Lanell Matar, CRNA, CRNA  Patient location: Pre-op. Preanesthetic checklist: patient identified, IV checked, site marked, risks and benefits discussed, surgical consent, monitors and equipment checked, pre-op evaluation and anesthesia consent Lidocaine 1% used for infiltration Right, radial was placed Catheter size: 20 G Hand hygiene performed  and Seldinger technique used  Attempts: 1 Procedure performed without using ultrasound guided technique. Following insertion, dressing applied. Post procedure assessment: normal and unchanged  Patient tolerated the procedure well with no immediate complications.

## 2019-06-08 NOTE — Transfer of Care (Signed)
Immediate Anesthesia Transfer of Care Note  Patient: Edward Campos  Procedure(s) Performed: INSERTION OF RIGHT COMMON ILIAC AND EXTERNAL ILICAC STENT (Right Groin) BYPASS GRAFT FEMORAL-FEMORAL ARTERY (Bilateral Groin) Patch Angioplasty Right Common Femoral (Right Groin) Right Endarterectomy Femoral (Right Groin) Profundoplasty (Right Groin)  Patient Location: PACU  Anesthesia Type:General  Level of Consciousness: drowsy and patient cooperative  Airway & Oxygen Therapy: Patient Spontanous Breathing and Patient connected to nasal cannula oxygen  Post-op Assessment: Report given to RN, Post -op Vital signs reviewed and stable and Patient moving all extremities  Post vital signs: Reviewed and stable  Last Vitals:  Vitals Value Taken Time  BP 138/97 06/08/19 1720  Temp    Pulse 83 06/08/19 1722  Resp 13 06/08/19 1722  SpO2 100 % 06/08/19 1722  Vitals shown include unvalidated device data.  Last Pain:  Vitals:   06/08/19 1006  PainSc: 0-No pain         Complications: No apparent anesthesia complications

## 2019-06-08 NOTE — Progress Notes (Signed)
[facility]  Subjective  - feels anxious   Objective 121/69 81 (!) 97.4 F (36.3 C) (Oral) 12 99%  Intake/Output Summary (Last 24 hours) at 06/08/2019 2227 Last data filed at 06/08/2019 1722 Gross per 24 hour  Intake 2400 ml  Output 1335 ml  Net 1065 ml   Called to see pt for hypotension and diaphoresis Pt had episode of bradycardia in 40s followed by diaphoresis and anxiety BP cuff is reading about 30 mm lower than Aline Also pt has known left SCA occlusion so BP on left is artificially low Right groin has some mild swelling extending over to right side of pubis most likely related to tunneling  Doppler signal in fem fem right and left DP and left peroneal BP is now 170s systolic and receiving fluid bolus HR in 60s Pt feels better but still a little anxious  Assessment/Planning: Bradycardia and hypotension seems to have had a vagal event 12 lead EKG sinus brady  Will check troponin and CBC  Does not appear to be bleeding Will follow up labwork Finish fluid bolus  Do not measure BP in left arm it is artificially low  Fabienne Bruns 06/08/2019 10:27 PM --  Laboratory Lab Results: No results for input(s): WBC, HGB, HCT, PLT in the last 72 hours. BMET No results for input(s): NA, K, CL, CO2, GLUCOSE, BUN, CREATININE, CALCIUM in the last 72 hours.  COAG Lab Results  Component Value Date   INR 1.1 06/04/2019   No results found for: PTT

## 2019-06-09 ENCOUNTER — Encounter: Payer: Self-pay | Admitting: *Deleted

## 2019-06-09 ENCOUNTER — Inpatient Hospital Stay (HOSPITAL_COMMUNITY): Payer: Medicare Other

## 2019-06-09 DIAGNOSIS — I739 Peripheral vascular disease, unspecified: Secondary | ICD-10-CM

## 2019-06-09 LAB — BASIC METABOLIC PANEL
Anion gap: 7 (ref 5–15)
BUN: 15 mg/dL (ref 8–23)
CO2: 23 mmol/L (ref 22–32)
Calcium: 8 mg/dL — ABNORMAL LOW (ref 8.9–10.3)
Chloride: 104 mmol/L (ref 98–111)
Creatinine, Ser: 0.91 mg/dL (ref 0.61–1.24)
GFR calc Af Amer: >60 mL/min (ref >60)
GFR calc non Af Amer: >60 mL/min (ref >60)
Glucose, Bld: 169 mg/dL — ABNORMAL HIGH (ref 70–99)
Potassium: 4 mmol/L (ref 3.5–5.1)
Sodium: 134 mmol/L — ABNORMAL LOW (ref 135–145)

## 2019-06-09 LAB — TROPONIN I (HIGH SENSITIVITY)
Troponin I (High Sensitivity): 5 ng/L (ref <18)
Troponin I (High Sensitivity): 6 ng/L (ref <18)

## 2019-06-09 MED ORDER — ROSUVASTATIN CALCIUM 20 MG PO TABS
20.0000 mg | ORAL_TABLET | Freq: Every day | ORAL | Status: DC
  Administered 2019-06-10: 20 mg via ORAL
  Filled 2019-06-09: qty 1

## 2019-06-09 NOTE — Progress Notes (Signed)
Post Op ABI complete.  Please see CV Proc tab for preliminary results. Levin Bacon- RDMS, RVT 4:00 PM  06/09/2019

## 2019-06-09 NOTE — Progress Notes (Addendum)
  Progress Note    06/09/2019 7:52 AM 1 Day Post-Op  Subjective:  Comfortable this morning, states that he had episode last night where he became very anxious and started sweating. No recurrence over night. Minimal pain   Afebrile HR 57-61 SBPs 110-106   Vitals:   06/09/19 0210 06/09/19 0421  BP: (!) 110/56 (!) 106/55  Pulse: 60 (!) 57  Resp: 11 11  Temp: 97.6 F (36.4 C) 97.6 F (36.4 C)  SpO2: 97% 97%   Physical Exam: General: well appearing, well nourished, not in any acute discomfort Lungs:  Non labored, equal respirations bilaterally Incisions:  Bilateral groin incisions clean, dry  And intact. Ecchymosis present with dependency into scrotum. Mildly tender. No swelling or hematoma appreciable.  Extremities: Bilateral lower extremities well perfused and warm. Dopper Pt/ DP/ pero bilaterally Abdomen:  Soft non tender Neurologic: alert and oriented  CBC    Component Value Date/Time   WBC 12.8 (H) 06/08/2019 2245   RBC 3.37 (L) 06/08/2019 2245   HGB 10.8 (L) 06/08/2019 2245   HCT 30.2 (L) 06/08/2019 2245   PLT 163 06/08/2019 2245   MCV 89.6 06/08/2019 2245   MCH 32.0 06/08/2019 2245   MCHC 35.8 06/08/2019 2245   RDW 12.1 06/08/2019 2245   LYMPHSABS 2.0 10/10/2010 1100   MONOABS 0.8 10/10/2010 1100   EOSABS 0.3 10/10/2010 1100   BASOSABS 0.1 10/10/2010 1100    BMET    Component Value Date/Time   NA 134 (L) 06/08/2019 2213   K 4.0 06/08/2019 2213   CL 104 06/08/2019 2213   CO2 23 06/08/2019 2213   GLUCOSE 169 (H) 06/08/2019 2213   BUN 15 06/08/2019 2213   CREATININE 0.91 06/08/2019 2213   CALCIUM 8.0 (L) 06/08/2019 2213   GFRNONAA >60 06/08/2019 2213   GFRAA >60 06/08/2019 2213    INR    Component Value Date/Time   INR 1.1 06/04/2019 1016     Intake/Output Summary (Last 24 hours) at 06/09/2019 0752 Last data filed at 06/09/2019 0453 Gross per 24 hour  Intake 3932.76 ml  Output 1760 ml  Net 2172.76 ml     Assessment/Plan:  86 y.o. male is  s/p Right common femoral external iliac endarterectomy, right common (9 x 39 VBX) and external iliac stent 8 x 59 VBX, 8 x 40 Inova, right to left femoral-femoral bypass, left external iliac common femoral endarterectomy, patch angioplasty right common femoral artery Dacron 1 Day Post-Op. Had overnight hypotension, bradycardia and diaphoresis. He is feeling better this morning- less anxious. He has been otherwise in asymptomatic sinus bradycardia. Will follow up labs. Appears stable this morning. Ambulate today  DVT prophylaxis:  Sq Heparin   Graceann Congress, PA-C [facility] [phone] 06/09/2019 7:52 AM   Agree with above.  Bilateral groin incisions intact no significant hematoma no erythema.  He does have scrotal ecchymosis which is not too surprising.  He has brisk peroneal and dorsalis pedis Doppler signals bilaterally.  These are biphasic.  He has a good signal over the graft.  Out of bed ambulate today.  Home when pain controlled.  Troponin negative  Fabienne Bruns, MD [facility] [place] Office: [phone]

## 2019-06-09 NOTE — Progress Notes (Signed)
MOBILITY TEAM - Progress Note   06/09/19 1319  Mobility  Activity Ambulated in hall  Level of Assistance Independent after set-up  Assistive Device None  Distance Ambulated (ft) 100 ft  Mobility Response Tolerated well  Mobility performed by Mobility specialist  Bed Position Chair   Hallway ambulation limited by IV dislodged requiring return to room for bleeding control. Pt in no distress, RN present to assist. After this, transport team waiting for pt transport.  Ina Homes, PT, DPT Mobility Team Pager [phone]

## 2019-06-09 NOTE — Progress Notes (Signed)
Pt suddenly started to feel light-headed and broke out in a cold sweat.  His heart rate, which was in the 70s, dropped down to the mid 50s and 40s sometimes.  The BP was 75/46.  The incision site at the right groin was harder than before and had more bruising.  Paged the on call vascular surgeon.  The doctor ordered a 1 Liter saline bolus, STAT 12-Lead EKG, and STAT CBC and Troponin blood labs.  Will continue to monitor.  Harriet Masson, RN

## 2019-06-09 NOTE — Evaluation (Addendum)
Occupational Therapy Evaluation Patient Details Name: Edward Campos MRN: [id number] DOB: 1932-11-18 Today's Date: 06/09/2019    History of Present Illness Patient is a 86 y/o male who presents s/p Right common femoral external iliac endarterectomy, right common and external iliac stent, right to left femoral-femoral bypass, left external iliac common femoral endarterectomy, patch angioplasty right common femoral artery 3/29. PMH includes HTN, dizziness, AAA, anxiety.   Clinical Impression   Patient currently supervision level for safety with mobility and functional transfers without AD, reports minimal pain in LE with mobility. Patient supervision level for LB dressing and sink side grooming/hygiene, no loss of balance noted or complaints of shortness of breath. Will continue to follow with acute OT to maximize patient safety/independence to return to independent level with self care as patient is independent and lives home alone.    Patient expressed that he was going to outpatient PT prior to surgery and is interested in continuing upon discharge.    Follow Up Recommendations  No OT follow up    Equipment Recommendations  3 in 1 bedside commode       Precautions / Restrictions Precautions Precautions: Fall Restrictions Weight Bearing Restrictions: No      Mobility Bed Mobility Overal bed mobility: Needs Assistance Bed Mobility: Supine to Sit;Sit to Supine     Supine to sit: Modified independent (Device/Increase time);HOB elevated Sit to supine: Modified independent (Device/Increase time);HOB elevated   General bed mobility comments: Use of rails, no assist needed.  Transfers Overall transfer level: Needs assistance Equipment used: None Transfers: Sit to/from Stand Sit to Stand: Supervision         General transfer comment: supervision for safety/line management    Balance Overall balance assessment: Needs assistance Sitting-balance support: Feet supported;No upper  extremity supported Sitting balance-Leahy Scale: Good Sitting balance - Comments: Able to reach outside BoS and donn shoes without difficulty or LOB.   Standing balance support: During functional activity;No upper extremity supported Standing balance-Leahy Scale: Fair                             ADL either performed or assessed with clinical judgement   ADL Overall ADL's : Needs assistance/impaired     Grooming: Oral care;Wash/dry face;Wash/dry hands;Supervision/safety;Standing   Upper Body Bathing: Set up;Sitting   Lower Body Bathing: Supervison/ safety;Sitting/lateral leans;Sit to/from stand   Upper Body Dressing : Set up;Sitting   Lower Body Dressing: Supervision/safety;Set up;Sitting/lateral leans;Sit to/from stand Lower Body Dressing Details (indicate cue type and reason): pt able to don slippers/pull over heels without assist at EOB Toilet Transfer: Supervision/safety;Ambulation Toilet Transfer Details (indicate cue type and reason): simulated with functional mobility, no physical assist needed Toileting- Clothing Manipulation and Hygiene: Independent       Functional mobility during ADLs: Supervision/safety                    Pertinent Vitals/Pain Pain Assessment: Faces Faces Pain Scale: Hurts a little bit Pain Location: leg with mobility, throat hoarseness Pain Descriptors / Indicators: Sore Pain Intervention(s): Monitored during session     Hand Dominance Right   Extremity/Trunk Assessment Upper Extremity Assessment Upper Extremity Assessment: Overall WFL for tasks assessed   Lower Extremity Assessment Lower Extremity Assessment: Defer to PT evaluation        Communication Communication Communication: No difficulties   Cognition Arousal/Alertness: Awake/alert Behavior During Therapy: WFL for tasks assessed/performed Overall Cognitive Status: Within Functional Limits for tasks assessed  General Comments  VSS on RA.            Home Living Family/patient expects to be discharged to:: Private residence Living Arrangements: Alone Available Help at Discharge: Family;Available PRN/intermittently(DTR lives 50 miles away) Type of Home: House Home Access: Stairs to enter [employer] of Steps: 8 steps from garage Entrance Stairs-Rails: Right Home Layout: Other (Comment)(split level) Alternate Level Stairs-Number of Steps: 6 Alternate Level Stairs-Rails: Right Bathroom Shower/Tub: Walk-in shower;Tub/shower unit   Bathroom Toilet: Standard     Home Equipment: None          Prior Functioning/Environment Level of Independence: Independent        Comments: Does own ADLs. Drives. Cooks/cleans.        OT Problem List: Decreased activity tolerance;Pain      OT Treatment/Interventions: Self-care/ADL training;Therapeutic exercise;DME and/or AE instruction;Therapeutic activities;Patient/family education;Balance training    OT Goals(Current goals can be found in the care plan section) Acute Rehab OT Goals Patient Stated Goal: to go home OT Goal Formulation: With patient Time For Goal Achievement: 06/23/19 Potential to Achieve Goals: Good  OT Frequency: Min 2X/week    AM-PAC OT "6 Clicks" Daily Activity     Outcome Measure Help from another person eating meals?: None Help from another person taking care of personal grooming?: A Little Help from another person toileting, which includes using toliet, bedpan, or urinal?: A Little Help from another person bathing (including washing, rinsing, drying)?: A Little Help from another person to put on and taking off regular upper body clothing?: A Little Help from another person to put on and taking off regular lower body clothing?: A Little 6 Click Score: 19   End of Session    Activity Tolerance: Patient tolerated treatment well Patient left: in bed;with call bell/phone within reach  OT Visit  Diagnosis: Unsteadiness on feet (R26.81);Pain Pain - Right/Left: Right Pain - part of body: Leg                Time: 4098-1191 OT Time Calculation (min): 28 min Charges:  OT General Charges $OT Visit: 1 Visit OT Evaluation $OT Eval Moderate Complexity: 1 Mod OT Treatments $Self Care/Home Management : 8-22 mins  Myrtie Neither OT OT office: [phone]  Carmelia Roller 06/09/2019, 1:17 PM

## 2019-06-09 NOTE — Progress Notes (Signed)
After the Liter bolus was complete, the patient's blood pressure went up to 130/64 and his heart rate was at around 60 bpm.  Patient states he feels better and less drowsy.  No longer diaphoretic.  Will continue to monitor.  Harriet Masson, RN

## 2019-06-09 NOTE — Evaluation (Signed)
Physical Therapy Evaluation Patient Details Name: Edward Campos MRN: [id number] DOB: 1933/01/07 Today's Date: 06/09/2019   History of Present Illness  Patient is a 86 y/o male who presents s/p Right common femoral external iliac endarterectomy, right common and external iliac stent, right to left femoral-femoral bypass, left external iliac common femoral endarterectomy, patch angioplasty right common femoral artery 3/29. PMH includes HTN, dizziness, AAA, anxiety.  Clinical Impression  Patient presents with RLE numbness (premorbid), impaired balance, dyspnea on exertion and impaired mobility s/p above. Pt lives alone and independent PTA. Today, pt tolerated bed mobility, transfers and gait training with min guard-supervision for safety. Noted to have 2/4 DOE, VSS on RA. Will need BSC to help with "low boy" toilet at home. Encouraged walking with nursing 3 times/day. Will follow acutely to maximize independence and mobility prior to return home.     Follow Up Recommendations No PT follow up;Supervision - Intermittent    Equipment Recommendations  3in1 (PT)    Recommendations for Other Services       Precautions / Restrictions Precautions Precautions: Fall Restrictions Weight Bearing Restrictions: No      Mobility  Bed Mobility Overal bed mobility: Needs Assistance Bed Mobility: Supine to Sit;Sit to Supine     Supine to sit: Modified independent (Device/Increase time);HOB elevated Sit to supine: Modified independent (Device/Increase time);HOB elevated   General bed mobility comments: Use of rails, no assist needed.  Transfers Overall transfer level: Needs assistance Equipment used: None Transfers: Sit to/from Stand Sit to Stand: Min guard         General transfer comment: Min guard for safety. Stood from [employer].  Ambulation/Gait Ambulation/Gait assistance: Supervision Gait Distance (Feet): 400 Feet Assistive device: None Gait Pattern/deviations: Step-through  pattern;Decreased stride length;Staggering right   Gait velocity interpretation: 1.31 - 2.62 ft/sec, indicative of limited community ambulator General Gait Details: Mostly steady gait with a few instances of staggering due to right heel numbness, no overt LOB. 2/4 DOE. VSS on RA.  Stairs            Wheelchair Mobility    Modified Rankin (Stroke Patients Only)       Balance Overall balance assessment: Needs assistance Sitting-balance support: Feet supported;No upper extremity supported Sitting balance-Leahy Scale: Good Sitting balance - Comments: Able to reach outside BoS and donn shoes without difficulty or LOB.   Standing balance support: During functional activity Standing balance-Leahy Scale: Fair                               Pertinent Vitals/Pain Pain Assessment: No/denies pain    Home Living Family/patient expects to be discharged to:: Private residence Living Arrangements: Alone Available Help at Discharge: Family;Available PRN/intermittently(daughter lives 50 miles away) Type of Home: House Home Access: Stairs to enter Entrance Stairs-Rails: Right Entrance Stairs-Number of Steps: 8 steps from garage Home Layout: Two level Home Equipment: None      Prior Function Level of Independence: Independent         Comments: Does own ADLs. Drives. Cooks/cleans.     Hand Dominance        Extremity/Trunk Assessment   Upper Extremity Assessment Upper Extremity Assessment: Defer to OT evaluation    Lower Extremity Assessment Lower Extremity Assessment: Overall WFL for tasks assessed;RLE deficits/detail RLE Deficits / Details: Grossly ~4/5 throughout RLE Sensation: decreased light touch(right heel and back of calf)       Communication   Communication: No difficulties  Cognition Arousal/Alertness: Awake/alert Behavior During Therapy: WFL for tasks assessed/performed Overall Cognitive Status: Within Functional Limits for tasks assessed                                         General Comments General comments (skin integrity, edema, etc.): VSS on RA.    Exercises     Assessment/Plan    PT Assessment Patient needs continued PT services  PT Problem List Impaired sensation;Decreased balance;Decreased activity tolerance;Cardiopulmonary status limiting activity;Decreased skin integrity       PT Treatment Interventions Therapeutic activities;Therapeutic exercise;Patient/family education;Stair training;Functional mobility training    PT Goals (Current goals can be found in the Care Plan section)  Acute Rehab PT Goals Patient Stated Goal: to go home PT Goal Formulation: With patient Time For Goal Achievement: 06/23/19 Potential to Achieve Goals: Good    Frequency Min 3X/week   Barriers to discharge Decreased caregiver support lives alone    Co-evaluation               AM-PAC PT "6 Clicks" Mobility  Outcome Measure Help needed turning from your back to your side while in a flat bed without using bedrails?: None Help needed moving from lying on your back to sitting on the side of a flat bed without using bedrails?: A Little Help needed moving to and from a bed to a chair (including a wheelchair)?: None Help needed standing up from a chair using your arms (e.g., wheelchair or bedside chair)?: None Help needed to walk in hospital room?: None Help needed climbing 3-5 steps with a railing? : A Little 6 Click Score: 22    End of Session Equipment Utilized During Treatment: Gait belt Activity Tolerance: Patient tolerated treatment well Patient left: in bed;with call bell/phone within reach Nurse Communication: Mobility status PT Visit Diagnosis: Difficulty in walking, not elsewhere classified (R26.2)    Time: 8563-1497 PT Time Calculation (min) (ACUTE ONLY): 19 min   Charges:   PT Evaluation $PT Eval Moderate Complexity: 1 Mod          Edward Campos, PT, DPT Acute Rehabilitation  Services Pager [phone] Office [phone]      Blake Divine A Lanier Ensign 06/09/2019, 11:34 AM

## 2019-06-10 MED ORDER — CLOPIDOGREL BISULFATE 75 MG PO TABS: 75.0000 mg | ORAL_TABLET | Freq: Every day | ORAL | 1 refills | Status: DC

## 2019-06-10 MED ORDER — ROSUVASTATIN CALCIUM 10 MG PO TABS: 20.0000 mg | ORAL_TABLET | Freq: Every day | ORAL | 11 refills | Status: DC

## 2019-06-10 MED ORDER — OXYCODONE HCL 5 MG PO TABS: 5.0000 mg | ORAL_TABLET | Freq: Four times a day (QID) | ORAL | 0 refills | Status: DC | PRN

## 2019-06-10 NOTE — Discharge Instructions (Signed)
 [facility] [place]  Discharge instructions  Lower Extremity Bypass Surgery  Please refer to the following instruction for your post-procedure care. Your surgeon or physician assistant will discuss any changes with you.  Activity  You are encouraged to walk as much as you can. You can slowly return to normal activities during the month after your surgery. Avoid strenuous activity and heavy lifting until your doctor tells you it's OK. Avoid activities such as vacuuming or swinging a golf club. Do not drive until your doctor give the OK and you are no longer taking prescription pain medications. It is also normal to have difficulty with sleep habits, eating and bowel movement after surgery. These will go away with time.  Bathing/Showering  You may shower after you go home. Do not soak in a bathtub, hot tub, or swim until the incision heals completely.  Incision Care  Clean your incision with mild soap and water. Shower every day. Pat the area dry with a clean towel. You do not need a bandage unless otherwise instructed. Do not apply any ointments or creams to your incision. If you have open wounds you will be instructed how to care for them or a visiting nurse may be arranged for you. If you have staples or sutures along your incision they will be removed at your post-op appointment. You may have skin glue on your incision. Do not peel it off. It will come off on its own in about one week. If you have a great deal of moisture in your groin, use a gauze help keep this area dry.  Diet  Resume your normal diet. There are no special food restrictions following this procedure. A low fat/ low cholesterol diet is recommended for all patients with vascular disease. In order to heal from your surgery, it is CRITICAL to get adequate nutrition. Your body requires vitamins, minerals, and protein. Vegetables are the best source of vitamins and minerals. Vegetables also provide the  perfect balance of protein. Processed food has little nutritional value, so try to avoid this.  Medications  Resume taking all your medications unless your doctor or nurse practitioner tells you not to. If your incision is causing pain, you may take over-the-counter pain relievers such as acetaminophen (Tylenol). If you were prescribed a stronger pain medication, please aware these medication can cause nausea and constipation. Prevent nausea by taking the medication with a snack or meal. Avoid constipation by drinking plenty of fluids and eating foods with high amount of fiber, such as fruits, vegetables, and grains. Take Colase 100 mg (an over-the-counter stool softener) twice a day as needed for constipation. Do not take Tylenol if you are taking prescription pain medications.  Follow Up  Our office will schedule a follow up appointment 2-3 weeks following discharge.  Please call us immediately for any of the following conditions  Severe or worsening pain in your legs or feet while at rest or while walking Increase pain, redness, warmth, or drainage (pus) from your incision site(s) Fever of 101 degree or higher The swelling in your leg with the bypass suddenly worsens and becomes more painful than when you were in the hospital If you have been instructed to feel your graft pulse then you should do so every day. If you can no longer feel this pulse, call the office immediately. Not all patients are given this instruction.  Leg swelling is common after leg bypass surgery.  The swelling should improve over a few months   following surgery. To improve the swelling, you may elevate your legs above the level of your heart while you are sitting or resting. Your surgeon or physician assistant may ask you to apply an ACE wrap or wear compression (TED) stockings to help to reduce swelling.  Reduce your risk of vascular disease  Stop smoking. If you would like help call [facility] at 1-800-QUIT-NOW  (1-800-784-8669) or [place] at 336-586-4000.  Manage your cholesterol Maintain a desired weight Control your diabetes weight Control your diabetes Keep your blood pressure down  If you have any questions, please call the office at [phone]   

## 2019-06-10 NOTE — Discharge Summary (Signed)
Discharge Summary  Patient ID: Edward Campos [id number] 86 y.o. 27-Dec-1932  Admit date: 06/08/2019  Discharge date and time: 06/10/19   Admitting Physician: Sherren Kerns, MD   Discharge Physician: Same  Admission Diagnoses: Status post insertion of iliac artery stent [Z95.828] Peripheral vascular disease [facility]) [I73.9]  Discharge Diagnoses: Same  Admission Condition: fair  Discharged Condition: fair  Indication for Admission: Debilitating claudication  Hospital Course: [name] is a 86 year old male who was brought in as an outpatient and underwent right common and external iliac artery stent, bilateral common femoral artery endarterectomies, and right to left femoral to femoral bypass graft by Dr. Darrick Penna on 06/08/2019 due to debilitating claudication.  He tolerated the procedure well and was admitted to the hospital postoperatively.  POD #1 overnight he had a vagal episode with bradycardia and hypotension however this resolved spontaneously.  Troponin was negative.  POD #2 he is tolerating a regular diet, ambulating without difficulty, and feeling ready for discharge home.  At the time of discharge he has a palpable graft pulse with warm and well-perfused feet.  He will be on aspirin and Plavix daily.  He will be prescribed several days of narcotic pain medication for continued postoperative pain control.  He will follow-up with Dr. Darrick Penna in about 2 to 3 weeks.  He will be discharged home this morning in stable condition.  Consults: None  Treatments: surgery: Right common and external iliac artery stent and right to left femoral to femoral bypass with bilateral femoral endarterectomy by Dr. Darrick Penna on 06/08/2019.  Discharge Exam: See progress note 06/10/2019 Vitals:   06/10/19 0318 06/10/19 0700  BP: (!) 126/53 125/63  Pulse: 74 76  Resp: 20 19  Temp: 98.4 F (36.9 C) 98.7 F (37.1 C)  SpO2: 94% 94%     Disposition: Discharge disposition: 01-Home or Self  Care       Patient Instructions:  Allergies as of 06/10/2019      Reactions   Penicillins Other (See Comments)   UNSPECIFIED REACTION  Did it involve swelling of the face/tongue/throat, SOB, or low BP? Unknown Did it involve sudden or severe rash/hives, skin peeling, or any reaction on the inside of your mouth or nose? Unknown Did you need to seek medical attention at a hospital or doctor's office? Unknown When did it last happen?Between ages 71-13 If all above answers are "NO", may proceed with cephalosporin use.      Medication List    TAKE these medications   aspirin EC 81 MG tablet Take 1 tablet (81 mg total) by mouth daily.   clopidogrel 75 MG tablet Commonly known as: PLAVIX Take 1 tablet (75 mg total) by mouth daily at 6 (six) AM. Start taking on: June 11, 2019   multivitamin with minerals tablet Take 1 tablet by mouth daily with lunch.   oxyCODONE 5 MG immediate release tablet Commonly known as: Oxy IR/ROXICODONE Take 1 tablet (5 mg total) by mouth every 6 (six) hours as needed for moderate pain.   rosuvastatin 10 MG tablet Commonly known as: Crestor Take 2 tablets (20 mg total) by mouth daily. What changed: how much to take            Durable Medical Equipment  (From admission, onward)         Start     Ordered   06/10/19 0910  For home use only DME 3 n 1  Once     06/10/19 0909  Activity: activity as tolerated Diet: regular diet Wound Care: keep wound clean and dry  Follow-up with Dr. Oneida Alar in 3 weeks.  Signed: Dagoberto Ligas, PA-C 06/10/2019 10:13 AM [facility] Office: [phone]

## 2019-06-10 NOTE — Progress Notes (Addendum)
  Progress Note    06/10/2019 7:24 AM 2 Days Post-Op  Subjective:  Ready for discharge home today.  Legs feel much better compared to pre-op   Vitals:   06/09/19 2312 06/10/19 0318  BP: (!) 128/52 (!) 126/53  Pulse: 81 74  Resp: 20 20  Temp: 100.2 F (37.9 C) 98.4 F (36.9 C)  SpO2: 99% 94%   Physical Exam: Lungs:  Non labored Incisions:  Groin incisions c/d/i; palpable graft pulse and L femoral pulse; scrotal ecchymosis Extremities:  Feet warm and well perfused Neurologic: A&O  CBC    Component Value Date/Time   WBC 12.8 (H) 06/08/2019 2245   RBC 3.37 (L) 06/08/2019 2245   HGB 10.8 (L) 06/08/2019 2245   HCT 30.2 (L) 06/08/2019 2245   PLT 163 06/08/2019 2245   MCV 89.6 06/08/2019 2245   MCH 32.0 06/08/2019 2245   MCHC 35.8 06/08/2019 2245   RDW 12.1 06/08/2019 2245   LYMPHSABS 2.0 10/10/2010 1100   MONOABS 0.8 10/10/2010 1100   EOSABS 0.3 10/10/2010 1100   BASOSABS 0.1 10/10/2010 1100    BMET    Component Value Date/Time   NA 134 (L) 06/08/2019 2213   K 4.0 06/08/2019 2213   CL 104 06/08/2019 2213   CO2 23 06/08/2019 2213   GLUCOSE 169 (H) 06/08/2019 2213   BUN 15 06/08/2019 2213   CREATININE 0.91 06/08/2019 2213   CALCIUM 8.0 (L) 06/08/2019 2213   GFRNONAA >60 06/08/2019 2213   GFRAA >60 06/08/2019 2213    INR    Component Value Date/Time   INR 1.1 06/04/2019 1016     Intake/Output Summary (Last 24 hours) at 06/10/2019 0724 Last data filed at 06/10/2019 0548 Gross per 24 hour  Intake 926.67 ml  Output 450 ml  Net 476.67 ml     Assessment/Plan:  86 y.o. male is s/p R iliac stent with R to L fem fem bypass 2 Days Post-Op   Feet warm and well perfused with palpable L femoral pulse Ok for discharge home today Follow up with Dr. Darrick Penna in 2-3 weeks   Emilie Rutter, PA-C [facility] [phone] 06/10/2019 7:24 AM  Agree with above.  ABIs about the same as preop will probably improve with time with increased profunda  flow  D/c home  Fabienne Bruns, MD [facility] [place] Office: [phone]

## 2019-06-10 NOTE — Anesthesia Postprocedure Evaluation (Signed)
Anesthesia Post Note  Patient: Kael Forquer  Procedure(s) Performed: INSERTION OF RIGHT COMMON ILIAC AND EXTERNAL ILICAC STENT (Right Groin) BYPASS GRAFT FEMORAL-FEMORAL ARTERY (Bilateral Groin) Patch Angioplasty Right Common Femoral (Right Groin) ENDARTERECTOMY RIGHT COMMON FEMORAL; ENDARTERECTOMY RIGHT EXTERNAL ILIAC; ENDARTERECTOMY LEFT COMMON FEMORAL; ENDARTERECTOMY LEFT EXTERNAL ILIAC (Bilateral Groin) Profundoplasty (Right Groin)     Patient location during evaluation: PACU Anesthesia Type: General Level of consciousness: awake and alert Pain management: pain level controlled Vital Signs Assessment: post-procedure vital signs reviewed and stable Respiratory status: spontaneous breathing, nonlabored ventilation, respiratory function stable and patient connected to nasal cannula oxygen Cardiovascular status: blood pressure returned to baseline and stable Postop Assessment: no apparent nausea or vomiting Anesthetic complications: no    Last Vitals:  Vitals:   06/10/19 0318 06/10/19 0700  BP: (!) 126/53 125/63  Pulse: 74 76  Resp: 20 19  Temp: 36.9 C 37.1 C  SpO2: 94% 94%    Last Pain:  Vitals:   06/10/19 0800  TempSrc:   PainSc: 0-No pain                 Kennieth Rad

## 2019-06-10 NOTE — TOC Transition Note (Signed)
Transition of Care [facility]) - CM/SW Discharge Note Donn Pierini RN, BSN Transitions of Care Unit 4E- RN Case Manager [phone]   Patient Details  Name: Day Deery MRN: [id number] Date of Birth: 03-Oct-1932  Transition of Care [facility]) CM/SW Contact:  Darrold Span, RN Phone Number: 06/10/2019, 3:51 PM   Clinical Narrative:    Pt stable for transition home, from home with family, has pre-op referral with [facility] for any HH needs. Pt requesting 3n1 for home- order placed and call made to [facility] with [employer] for DME need. 3n1 to be delivered to room prior to discharge.   Final next level of care: Home/Self Care Barriers to Discharge: No Barriers Identified   Patient Goals and CMS Choice    home    Discharge Placement               home        Discharge Plan and Services                DME Arranged: 3-N-1   Date DME Agency Contacted: 06/10/19 Time DME Agency Contacted: 1000 Representative spoke with at DME Agency: [facility] HH Arranged: NA HH Agency: NA        Social Determinants of Health (SDOH) Interventions     Readmission Risk Interventions No flowsheet data found.

## 2019-06-10 NOTE — OR Nursing (Signed)
OR record addendum created 06/10/19 to accurately reflect procedures performed.

## 2019-06-10 NOTE — Progress Notes (Signed)
Discharge AVS meds take and those due reviewed with pt. Follow up appointments and when to call MD reviewed. All questions and concerns addressed. No further questions at this time. Home 3n1 delivered to room. delivered to bedside. D/c IV and TELE, CCMD notified. D/C home per orders.

## 2019-06-16 ENCOUNTER — Telehealth: Payer: Self-pay

## 2019-06-16 NOTE — Telephone Encounter (Signed)
Pt called with c/o of drainage from his incision and swelling of both feet but the R is worse (x3 days). Per PA he is going to be seen tomorrow for a wound check.

## 2019-06-17 ENCOUNTER — Other Ambulatory Visit: Payer: Self-pay

## 2019-06-17 ENCOUNTER — Ambulatory Visit (INDEPENDENT_AMBULATORY_CARE_PROVIDER_SITE_OTHER): Payer: Self-pay | Admitting: Physician Assistant

## 2019-06-17 VITALS — BP 133/65 | HR 82 | Resp 16 | Ht 70.0 in | Wt 164.4 lb

## 2019-06-17 DIAGNOSIS — I739 Peripheral vascular disease, unspecified: Secondary | ICD-10-CM

## 2019-06-17 MED ORDER — CEPHALEXIN 500 MG PO CAPS: 500.0000 mg | ORAL_CAPSULE | Freq: Three times a day (TID) | ORAL | 0 refills | Status: DC

## 2019-06-17 NOTE — Progress Notes (Signed)
POST OPERATIVE OFFICE NOTE    CC:  F/u for surgery  HPI:  This is a 86 y.o. male who is s/p  Right common femoral external iliac endarterectomy, right common (9 x 39 VBX) and external iliac stent 8 x 59 VBX, 8 x 40 Inova, right to left femoral-femoral bypass, left external iliac common femoral endarterectomy, patch angioplasty right common femoral artery Dacron on 06/08/2019 by Dr. Oneida Alar.  He called our office yesterday 06/16/19 and stayed he has clear drainage from both groin incisions.  He denise fever or chills, how ever he does have anxiety and hot flashes.    He states his left leg feels better since surgery, but has B foot edema.      Allergies  Allergen Reactions  . Penicillins Other (See Comments)    UNSPECIFIED REACTION  Did it involve swelling of the face/tongue/throat, SOB, or low BP? Unknown Did it involve sudden or severe rash/hives, skin peeling, or any reaction on the inside of your mouth or nose? Unknown Did you need to seek medical attention at a hospital or doctor's office? Unknown When did it last happen?Between ages 52-13 If all above answers are "NO", may proceed with cephalosporin use.     Current Outpatient Medications  Medication Sig Dispense Refill  . aspirin EC 81 MG tablet Take 1 tablet (81 mg total) by mouth daily. 150 tablet 10  . clopidogrel (PLAVIX) 75 MG tablet Take 1 tablet (75 mg total) by mouth daily at 6 (six) AM. 90 tablet 1  . Multiple Vitamins-Minerals (MULTIVITAMIN WITH MINERALS) tablet Take 1 tablet by mouth daily with lunch.     . oxyCODONE (OXY IR/ROXICODONE) 5 MG immediate release tablet Take 1 tablet (5 mg total) by mouth every 6 (six) hours as needed for moderate pain. 30 tablet 0  . rosuvastatin (CRESTOR) 10 MG tablet Take 2 tablets (20 mg total) by mouth daily. 30 tablet 11  . cephALEXin (KEFLEX) 500 MG capsule Take 1 capsule (500 mg total) by mouth 3 (three) times daily. 21 capsule 0   No current facility-administered medications for  this visit.     ROS:  See HPI  Physical Exam:  Vitals:   06/17/19 1116  BP: 133/65  Pulse: 82  Resp: 16  SpO2: 98%     Incision:  Groins without erythema or edema, clear drainage distal incisions.  No malodor or purulence. Palpable fem-fem graft pulse. Extremities:  Doppler signals B DP/PT/peroneal.  B Feet with moderate edema.   Heart: irregularly heart beat, pulse 82 Abdomen:  Soft NTTP + BS Carotid:  B bruits to auscultation  Assessment/Plan:  This is a 86 y.o. male who is s/p: Right common femoral external iliac endarterectomy, right common (9 x 39 VBX) and external iliac stent 8 x 59 VBX, 8 x 40 Inova, right to left femoral-femoral bypass, left external iliac common femoral endarterectomy, patch angioplasty right common femoral artery Dacron on 06/08/2019     He appears to have B lyph fluid drainage without symptoms of infection.  Dry dressing as needed.  I did place him on Keflex for 7 days and schedule a f/u in 1 week to see Dr. Oneida Alar.  I demonstrated and gave him a hand out for elevation of B LE.    He is seeing DR. Audie Box for Cardiology who ordered a carotid duplex on 06/04/19.  Shows < 39% stenosis B.  He is asymptomatic without weakness, amaurosis or aphasia.    I spoke with his daughter and  she understands that if he develops fever or chills and/or the incisions develop purulence or open they will call sooner.     Mosetta Pigeon PA-C [facility] [phone]  Clinic MD:  Edilia Bo

## 2019-06-18 ENCOUNTER — Ambulatory Visit: Payer: Medicare Other | Admitting: Medical

## 2019-06-18 ENCOUNTER — Telehealth: Payer: Self-pay | Admitting: *Deleted

## 2019-06-18 NOTE — Telephone Encounter (Signed)
Received a call from Home Health nurse Ernest Pine she states patient is refusing to go to cardiology appt today that was scheduled by [place]. Explained to her the reason for appt and she states she will encourage patient to reschedule appt if he has cancelled it. Also received message from patient stating he was not keeping cardiology appt and he was not keeping appt with Dr Darrick Penna on 06/24/19. Let Home Health nurse know about call she states she will let patient know he needs to keep appt with Dr Darrick Penna on 06/24/19. Reviewed all information with Lianne Cure PA.

## 2019-06-18 NOTE — Progress Notes (Deleted)
Cardiology Office Note   Date:  06/18/2019   ID:  Edward Campos, DOB 1932-11-04, MRN [id number]  PCP:  Patient, No Pcp Per  Cardiologist:  Reatha Harps, MD EP: None  No chief complaint on file.     History of Present Illness: Edward Campos is a 86 y.o. male who presents for ***    Past Medical History:  Diagnosis Date  . AAA (abdominal aortic aneurysm) (HCC)   . Anxiety   . Arthritis   . BPH (benign prostatic hyperplasia)   . Dizziness   . Hyperlipidemia     Past Surgical History:  Procedure Laterality Date  . ABDOMINAL AORTOGRAM W/LOWER EXTREMITY N/A 05/06/2019   Procedure: ABDOMINAL AORTOGRAM W/LOWER EXTREMITY;  Surgeon: Cephus Shelling, MD;  Location: [facility] INVASIVE CV LAB;  Service: Cardiovascular;  Laterality: N/A;  . ENDARTERECTOMY FEMORAL Bilateral 06/08/2019   Procedure: ENDARTERECTOMY RIGHT COMMON FEMORAL; ENDARTERECTOMY RIGHT EXTERNAL ILIAC; ENDARTERECTOMY LEFT COMMON FEMORAL; ENDARTERECTOMY LEFT EXTERNAL ILIAC;  Surgeon: Sherren Kerns, MD;  Location: [facility] OR;  Service: Vascular;  Laterality: Bilateral;  . FEMORAL-FEMORAL BYPASS GRAFT Bilateral 06/08/2019   Procedure: BYPASS GRAFT FEMORAL-FEMORAL ARTERY;  Surgeon: Sherren Kerns, MD;  Location: [facility] OR;  Service: Vascular;  Laterality: Bilateral;  . growth removed from chest    . HEMORRHOID SURGERY    . HERNIA REPAIR  12 yrs ago   left inguinal hernia  . INGUINAL HERNIA REPAIR  10/16/2010   Procedure: HERNIA REPAIR INGUINAL ADULT;  Surgeon: Dalia Heading;  Location: [facility] ORS;  Service: General;  Laterality: Right;  . INSERTION OF ILIAC STENT Right 06/08/2019   Procedure: INSERTION OF RIGHT COMMON ILIAC AND EXTERNAL ILICAC STENT;  Surgeon: Sherren Kerns, MD;  Location: [facility] OR;  Service: Vascular;  Laterality: Right;  . PATCH ANGIOPLASTY Right 06/08/2019   Procedure: Patch Angioplasty Right Common Femoral;  Surgeon: Sherren Kerns, MD;  Location: [facility] OR;  Service: Vascular;  Laterality: Right;      Current Outpatient Medications  Medication Sig Dispense Refill  . aspirin EC 81 MG tablet Take 1 tablet (81 mg total) by mouth daily. 150 tablet 10  . cephALEXin (KEFLEX) 500 MG capsule Take 1 capsule (500 mg total) by mouth 3 (three) times daily. 21 capsule 0  . clopidogrel (PLAVIX) 75 MG tablet Take 1 tablet (75 mg total) by mouth daily at 6 (six) AM. 90 tablet 1  . Multiple Vitamins-Minerals (MULTIVITAMIN WITH MINERALS) tablet Take 1 tablet by mouth daily with lunch.     . oxyCODONE (OXY IR/ROXICODONE) 5 MG immediate release tablet Take 1 tablet (5 mg total) by mouth every 6 (six) hours as needed for moderate pain. 30 tablet 0  . rosuvastatin (CRESTOR) 10 MG tablet Take 2 tablets (20 mg total) by mouth daily. 30 tablet 11   No current facility-administered medications for this visit.    Allergies:   Penicillins    Social History:  The patient  reports that he has been smoking cigarettes. He has a 17.50 pack-year smoking history. He has never used smokeless tobacco. He reports current alcohol use of about 3.0 standard drinks of alcohol per week. He reports that he does not use drugs.   Family History:  The patient's ***family history includes Stroke in his mother.    ROS:  Please see the history of present illness.   Otherwise, review of systems are positive for {NONE DEFAULTED:18576::"none"}.   All other systems are reviewed and negative.    PHYSICAL EXAM:  VS:  There were no vitals taken for this visit. , BMI There is no height or weight on file to calculate BMI. GEN: Well nourished, well developed, in no acute distress HEENT: normal Neck: no JVD, carotid bruits, or masses Cardiac: ***RRR; no murmurs, rubs, or gallops,no edema  Respiratory:  clear to auscultation bilaterally, normal work of breathing GI: soft, nontender, nondistended, + BS MS: no deformity or atrophy Skin: warm and dry, no rash Neuro:  Strength and sensation are intact Psych: euthymic mood, full  affect   EKG:  EKG {ACTION; IS/IS QPR:91638466} ordered today. The ekg ordered today demonstrates ***   Recent Labs: 05/29/2019: TSH 2.540 06/04/2019: ALT 22 06/08/2019: BUN 15; Creatinine, Ser 0.91; Hemoglobin 10.8; Platelets 163; Potassium 4.0; Sodium 134    Lipid Panel    Component Value Date/Time   CHOL 143 05/29/2019 1450   TRIG 101 05/29/2019 1450   HDL 48 05/29/2019 1450   CHOLHDL 3.0 05/29/2019 1450   LDLCALC 76 05/29/2019 1450      Wt Readings from Last 3 Encounters:  06/17/19 164 lb 6.4 oz (74.6 kg)  06/08/19 164 lb (74.4 kg)  06/05/19 163 lb (73.9 kg)      Other studies Reviewed: Additional studies/ records that were reviewed today include: ***.    ASSESSMENT AND PLAN:  1.  ***   Current medicines are reviewed at length with the patient today.  The patient {ACTIONS; HAS/DOES NOT HAVE:19233} concerns regarding medicines.  The following changes have been made:  {PLAN; NO CHANGE:13088:s}  Labs/ tests ordered today include: *** No orders of the defined types were placed in this encounter.    Disposition:   FU with *** in {gen number 5-99:357017} {Days to years:10300}  Signed, Abigail Butts, PA-C  06/18/2019 1:12 PM

## 2019-06-24 ENCOUNTER — Encounter: Payer: Self-pay | Admitting: Vascular Surgery

## 2019-06-24 ENCOUNTER — Telehealth: Payer: Self-pay

## 2019-06-24 ENCOUNTER — Other Ambulatory Visit: Payer: Self-pay

## 2019-06-24 ENCOUNTER — Ambulatory Visit (INDEPENDENT_AMBULATORY_CARE_PROVIDER_SITE_OTHER): Payer: Self-pay | Admitting: Vascular Surgery

## 2019-06-24 VITALS — BP 129/60 | HR 64 | Temp 97.8°F | Resp 18 | Ht 68.5 in | Wt 160.8 lb

## 2019-06-24 DIAGNOSIS — I739 Peripheral vascular disease, unspecified: Secondary | ICD-10-CM

## 2019-06-24 NOTE — Telephone Encounter (Signed)
-----   Message from Sherren Kerns, MD sent at 06/24/2019  8:38 AM EDT ----- Regarding: RE: Wound Check Contact: [phone] Ok thanks ----- Message ----- From: Yolonda Kida, LPN Sent: 3/53/2992  10:47 AM EDT To: Sherren Kerns, MD Subject: Wound Check                                    Good morning.  Just FYI: Marisue Ivan, nurse with [facility] called to say patients Left groin has been draining clear fluid x 1 1/2 wks and looks like he has a seroma.  Also, "when he bent over, fluid shot out 6 inches".  Patient already has an appt to see you tomorrow 04/14.  Thanks,  Ernst Spell., LPN

## 2019-06-24 NOTE — Progress Notes (Signed)
Patient is an 86 year old male who recently underwent right common femoral external iliac artery endarterectomy with stenting of the right common and external iliac artery and right to left femoral-femoral bypass with left external iliac common femoral endarterectomy.  Bypass was dacron.  Patient states the numbness in his right foot has resolved.  He states that he has not really walked enough to know if he has a difference in his claudication but feels that overall his feet are better.  He apparently had significant drainage from his groin yesterday.  However, today he has not had any drainage.  He has not had any fever or chills.  Physical exam:  Vitals:   06/24/19 1419 06/24/19 1421  BP: 97/60 129/60  Pulse: 64   Resp: 18   Temp: 97.8 F (36.6 C)   TempSrc: Temporal   SpO2: 98%   Weight: 160 lb 12.8 oz (72.9 kg)   Height: 5' 8.5" (1.74 m)     Extremities: Palpable femoral-femoral bypass graft pulse.  1+ left dorsalis pedis pulse.  Healing left and right groin incisions.  The groins are slightly fluctuant suggestive of may be bilateral seromas or small hematoma.  I was unable to express any drainage from the right or left groin today.  There is a small amount of maceration at the inferior aspect of the right groin incision about 1 cm in length.  There was no erythema.  Assessment: Slowly healing groin incisions status post bilateral endarterectomies and femoral-femoral bypass.  Plan: The patient will return for follow-up in 1 week for a wound check.  If he has any breakdown of his wound he may need VAC treatment.  Hopefully will not have any further drainage.  He probably does have a small seroma in each groin.  Fabienne Bruns, MD [facility] [place] Office: [phone]

## 2019-07-02 ENCOUNTER — Encounter: Payer: Self-pay | Admitting: Vascular Surgery

## 2019-07-02 ENCOUNTER — Other Ambulatory Visit: Payer: Self-pay

## 2019-07-02 ENCOUNTER — Ambulatory Visit (INDEPENDENT_AMBULATORY_CARE_PROVIDER_SITE_OTHER): Payer: Medicare Other | Admitting: Vascular Surgery

## 2019-07-02 ENCOUNTER — Encounter: Payer: Medicare Other | Admitting: Vascular Surgery

## 2019-07-02 VITALS — BP 94/65 | HR 65 | Temp 97.6°F | Resp 17 | Ht 68.5 in | Wt 160.4 lb

## 2019-07-02 DIAGNOSIS — I739 Peripheral vascular disease, unspecified: Secondary | ICD-10-CM

## 2019-07-02 MED ORDER — CEPHALEXIN 500 MG PO CAPS: 500.0000 mg | ORAL_CAPSULE | Freq: Three times a day (TID) | ORAL | 0 refills | Status: DC

## 2019-07-02 NOTE — Progress Notes (Signed)
Pt advised to hold Plavix for 3 days

## 2019-07-02 NOTE — Progress Notes (Signed)
Patient is an 86-year-old male who recently underwent right common femoral endarterectomy and stenting of the right common and external iliac artery followed by right to left femoral-femoral bypass with left external iliac and common femoral endarterectomy.  He was last seen 1 week ago.  He had had some drainage from his left groin and returns today for a wound check.  He is still having some occasional drainage from the left groin.  He now has some redness around the inferior aspect of the right and left groin incisions.  He has no claudication symptoms.  Physical exam:  Vitals:   07/02/19 1040  BP: 94/65  Pulse: 65  Resp: 17  Temp: 97.6 F (36.4 C)  TempSrc: Temporal  SpO2: 99%  Weight: 160 lb 6.4 oz (72.8 kg)  Height: 5' 8.5" (1.74 m)    3 x 4 cm mass left groin with slight erythema no active drainage, nonpulsatile  Right groin has area of erythema at the inferior aspect of the incision about 3 x 2 cm area no mass  2+ left dorsalis pedis pulse absent pedal pulses right foot  Assessment: Most likely lymphocele left groin but potentially could represent early infection.  Plan: Patient was started on Keflex 500 mg 3 times a day today.  Plan for I&D left groin in the operating room on Monday, July 06, 2019.  Potentially will place a VAC at the same setting.  Andrej Spagnoli, MD [facility] [place] Office: [phone]  

## 2019-07-02 NOTE — H&P (View-Only) (Signed)
Patient is an 86 year old male who recently underwent right common femoral endarterectomy and stenting of the right common and external iliac artery followed by right to left femoral-femoral bypass with left external iliac and common femoral endarterectomy.  He was last seen 1 week ago.  He had had some drainage from his left groin and returns today for a wound check.  He is still having some occasional drainage from the left groin.  He now has some redness around the inferior aspect of the right and left groin incisions.  He has no claudication symptoms.  Physical exam:  Vitals:   07/02/19 1040  BP: 94/65  Pulse: 65  Resp: 17  Temp: 97.6 F (36.4 C)  TempSrc: Temporal  SpO2: 99%  Weight: 160 lb 6.4 oz (72.8 kg)  Height: 5' 8.5" (1.74 m)    3 x 4 cm mass left groin with slight erythema no active drainage, nonpulsatile  Right groin has area of erythema at the inferior aspect of the incision about 3 x 2 cm area no mass  2+ left dorsalis pedis pulse absent pedal pulses right foot  Assessment: Most likely lymphocele left groin but potentially could represent early infection.  Plan: Patient was started on Keflex 500 mg 3 times a day today.  Plan for I&D left groin in the operating room on Monday, July 06, 2019.  Potentially will place a VAC at the same setting.  Fabienne Bruns, MD [facility] [place] Office: [phone]

## 2019-07-03 ENCOUNTER — Other Ambulatory Visit: Payer: Self-pay

## 2019-07-03 ENCOUNTER — Other Ambulatory Visit (HOSPITAL_COMMUNITY)
Admission: RE | Admit: 2019-07-03 | Discharge: 2019-07-03 | Disposition: A | Discharge status: Home / Self Care | Payer: Medicare Other | Source: Ambulatory Visit | Attending: Vascular Surgery | Admitting: Vascular Surgery

## 2019-07-03 DIAGNOSIS — Z01812 Encounter for preprocedural laboratory examination: Secondary | ICD-10-CM | POA: Diagnosis present

## 2019-07-03 DIAGNOSIS — Z20822 Contact with and (suspected) exposure to covid-19: Secondary | ICD-10-CM | POA: Diagnosis not present

## 2019-07-03 NOTE — Progress Notes (Signed)
Spoke with pt for pre-op call. Pt just had surgery on 06/08/19. Pt states nothing has changed with his medical and surgical hx. Pt states he was told by Dr. Darrick Penna to hold his Plavix 3 days prior to surgery. Last dose was 07/02/19.   Covid test scheduled for today. Instructed pt to quarantine once he gets the test done until he comes for surgery on Monday. He voiced understanding.

## 2019-07-04 LAB — SARS CORONAVIRUS 2 (TAT 6-24 HRS): SARS Coronavirus 2: NEGATIVE

## 2019-07-06 ENCOUNTER — Encounter (HOSPITAL_COMMUNITY): Payer: Self-pay | Admitting: Vascular Surgery

## 2019-07-06 ENCOUNTER — Inpatient Hospital Stay (HOSPITAL_COMMUNITY): Payer: Medicare Other | Admitting: Anesthesiology

## 2019-07-06 ENCOUNTER — Other Ambulatory Visit: Payer: Self-pay

## 2019-07-06 ENCOUNTER — Encounter (HOSPITAL_COMMUNITY)
Admission: RE | Disposition: A | Discharge status: Home / Self Care | Payer: Self-pay | Source: Home / Self Care | Attending: Vascular Surgery

## 2019-07-06 ENCOUNTER — Observation Stay (HOSPITAL_COMMUNITY)
Admission: RE | Admit: 2019-07-06 | Discharge: 2019-07-07 | Disposition: A | Discharge status: Home / Self Care | Payer: Medicare Other | Attending: Vascular Surgery | Admitting: Vascular Surgery

## 2019-07-06 DIAGNOSIS — Z95820 Peripheral vascular angioplasty status with implants and grafts: Secondary | ICD-10-CM | POA: Insufficient documentation

## 2019-07-06 DIAGNOSIS — M1990 Unspecified osteoarthritis, unspecified site: Secondary | ICD-10-CM | POA: Diagnosis not present

## 2019-07-06 DIAGNOSIS — Z8679 Personal history of other diseases of the circulatory system: Secondary | ICD-10-CM | POA: Diagnosis not present

## 2019-07-06 DIAGNOSIS — Y828 Other medical devices associated with adverse incidents: Secondary | ICD-10-CM | POA: Diagnosis not present

## 2019-07-06 DIAGNOSIS — S31104A Unspecified open wound of abdominal wall, left lower quadrant without penetration into peritoneal cavity, initial encounter: Secondary | ICD-10-CM | POA: Diagnosis not present

## 2019-07-06 DIAGNOSIS — Y838 Other surgical procedures as the cause of abnormal reaction of the patient, or of later complication, without mention of misadventure at the time of the procedure: Secondary | ICD-10-CM | POA: Diagnosis not present

## 2019-07-06 DIAGNOSIS — L7634 Postprocedural seroma of skin and subcutaneous tissue following other procedure: Secondary | ICD-10-CM | POA: Insufficient documentation

## 2019-07-06 DIAGNOSIS — I97648 Postprocedural seroma of a circulatory system organ or structure following other circulatory system procedure: Secondary | ICD-10-CM

## 2019-07-06 DIAGNOSIS — Z79899 Other long term (current) drug therapy: Secondary | ICD-10-CM | POA: Insufficient documentation

## 2019-07-06 DIAGNOSIS — Z87891 Personal history of nicotine dependence: Secondary | ICD-10-CM | POA: Diagnosis not present

## 2019-07-06 HISTORY — PX: APPLICATION OF WOUND VAC: SHX5189

## 2019-07-06 HISTORY — PX: INCISE AND DRAIN ABCESS: PRO64

## 2019-07-06 HISTORY — PX: GROIN DEBRIDEMENT: SHX5159

## 2019-07-06 LAB — CBC
HCT: 36.8 % — ABNORMAL LOW (ref 39.0–52.0)
Hemoglobin: 12.2 g/dL — ABNORMAL LOW (ref 13.0–17.0)
MCH: 30.5 pg (ref 26.0–34.0)
MCHC: 33.2 g/dL (ref 30.0–36.0)
MCV: 92 fL (ref 80.0–100.0)
Platelets: 226 10*3/uL (ref 150–400)
RBC: 4 MIL/uL — ABNORMAL LOW (ref 4.22–5.81)
RDW: 13.3 % (ref 11.5–15.5)
WBC: 6.9 10*3/uL (ref 4.0–10.5)
nRBC: 0 % (ref 0.0–0.2)

## 2019-07-06 LAB — POCT I-STAT, CHEM 8
BUN: 18 mg/dL (ref 8–23)
Calcium, Ion: 1.14 mmol/L — ABNORMAL LOW (ref 1.15–1.40)
Chloride: 99 mmol/L (ref 98–111)
Creatinine, Ser: 0.8 mg/dL (ref 0.61–1.24)
Glucose, Bld: 85 mg/dL (ref 70–99)
HCT: 35 % — ABNORMAL LOW (ref 39.0–52.0)
Hemoglobin: 11.9 g/dL — ABNORMAL LOW (ref 13.0–17.0)
Potassium: 4.2 mmol/L (ref 3.5–5.1)
Sodium: 135 mmol/L (ref 135–145)
TCO2: 25 mmol/L (ref 22–32)

## 2019-07-06 LAB — SURGICAL PCR SCREEN
MRSA, PCR: NEGATIVE
Staphylococcus aureus: NEGATIVE

## 2019-07-06 LAB — CREATININE, SERUM
Creatinine, Ser: 0.87 mg/dL (ref 0.61–1.24)
GFR calc Af Amer: >60 mL/min (ref >60)
GFR calc non Af Amer: >60 mL/min (ref >60)

## 2019-07-06 SURGERY — DEBRIDEMENT, INGUINAL REGION
Anesthesia: General | Laterality: Left

## 2019-07-06 MED ORDER — LABETALOL HCL 5 MG/ML IV SOLN: 10.0000 mg | INTRAVENOUS | Status: DC | PRN

## 2019-07-06 MED ORDER — HEPARIN SODIUM (PORCINE) 5000 UNIT/ML IJ SOLN
5000.0000 [IU] | Freq: Three times a day (TID) | INTRAMUSCULAR | Status: DC
  Administered 2019-07-07: 5000 [IU] via SUBCUTANEOUS
  Filled 2019-07-06 (×2): qty 1

## 2019-07-06 MED ORDER — FENTANYL CITRATE (PF) 100 MCG/2ML IJ SOLN: 25.0000 ug | INTRAMUSCULAR | Status: DC | PRN

## 2019-07-06 MED ORDER — SODIUM CHLORIDE 0.9 % IV SOLN: 500.0000 mL | Freq: Once | INTRAVENOUS | Status: DC | PRN

## 2019-07-06 MED ORDER — CHLORHEXIDINE GLUCONATE 4 % EX LIQD: 60.0000 mL | Freq: Once | CUTANEOUS | Status: DC

## 2019-07-06 MED ORDER — 0.9 % SODIUM CHLORIDE (POUR BTL) OPTIME
TOPICAL | Status: DC | PRN
  Administered 2019-07-06: 10:00:00 4000 mL

## 2019-07-06 MED ORDER — PHENYLEPHRINE HCL-NACL 10-0.9 MG/250ML-% IV SOLN
INTRAVENOUS | Status: DC | PRN
  Administered 2019-07-06: 25 ug/min via INTRAVENOUS

## 2019-07-06 MED ORDER — GLYCOPYRROLATE 0.2 MG/ML IJ SOLN
INTRAMUSCULAR | Status: DC | PRN
Start: 2019-07-06 — End: 2019-07-06
  Administered 2019-07-06: .2 mg via INTRAVENOUS

## 2019-07-06 MED ORDER — FENTANYL CITRATE (PF) 250 MCG/5ML IJ SOLN
INTRAMUSCULAR | Status: DC | PRN
  Administered 2019-07-06 (×2): 50 ug via INTRAVENOUS

## 2019-07-06 MED ORDER — LACTATED RINGERS IV SOLN: INTRAVENOUS | Status: DC

## 2019-07-06 MED ORDER — LACTATED RINGERS IV SOLN
INTRAVENOUS | Status: DC
Start: 2019-07-06 — End: 2019-07-06

## 2019-07-06 MED ORDER — ROSUVASTATIN CALCIUM 20 MG PO TABS
20.0000 mg | ORAL_TABLET | Freq: Every day | ORAL | Status: DC
  Administered 2019-07-06 – 2019-07-07 (×2): 20 mg via ORAL
  Filled 2019-07-06 (×2): qty 1

## 2019-07-06 MED ORDER — PROPOFOL 10 MG/ML IV BOLUS
INTRAVENOUS | Status: DC | PRN
  Administered 2019-07-06: 140 mg via INTRAVENOUS

## 2019-07-06 MED ORDER — SODIUM CHLORIDE 0.9 % IV SOLN: INTRAVENOUS | Status: DC

## 2019-07-06 MED ORDER — METOPROLOL TARTRATE 5 MG/5ML IV SOLN: 2.0000 mg | INTRAVENOUS | Status: DC | PRN

## 2019-07-06 MED ORDER — VANCOMYCIN HCL 1500 MG/300ML IV SOLN
1500.0000 mg | INTRAVENOUS | Status: DC
  Administered 2019-07-07: 1500 mg via INTRAVENOUS
  Filled 2019-07-06: qty 300

## 2019-07-06 MED ORDER — VANCOMYCIN HCL IN DEXTROSE 1-5 GM/200ML-% IV SOLN: 1000.0000 mg | INTRAVENOUS | Status: AC

## 2019-07-06 MED ORDER — CLOPIDOGREL BISULFATE 75 MG PO TABS
75.0000 mg | ORAL_TABLET | Freq: Every day | ORAL | Status: DC
  Administered 2019-07-07: 75 mg via ORAL
  Filled 2019-07-06: qty 1

## 2019-07-06 MED ORDER — PANTOPRAZOLE SODIUM 40 MG PO TBEC
40.0000 mg | DELAYED_RELEASE_TABLET | Freq: Every day | ORAL | Status: DC
  Administered 2019-07-06 – 2019-07-07 (×2): 40 mg via ORAL
  Filled 2019-07-06 (×2): qty 1

## 2019-07-06 MED ORDER — VANCOMYCIN HCL IN DEXTROSE 1-5 GM/200ML-% IV SOLN
INTRAVENOUS | Status: AC
  Administered 2019-07-06: 1000 mg via INTRAVENOUS
  Filled 2019-07-06: qty 200

## 2019-07-06 MED ORDER — MAGNESIUM SULFATE 2 GM/50ML IV SOLN
2.0000 g | Freq: Every day | INTRAVENOUS | Status: DC | PRN
  Filled 2019-07-06: qty 50

## 2019-07-06 MED ORDER — ASPIRIN EC 81 MG PO TBEC
81.0000 mg | DELAYED_RELEASE_TABLET | Freq: Every day | ORAL | Status: DC
  Administered 2019-07-06 – 2019-07-07 (×2): 81 mg via ORAL
  Filled 2019-07-06 (×2): qty 1

## 2019-07-06 MED ORDER — BISACODYL 10 MG RE SUPP: 10.0000 mg | Freq: Every day | RECTAL | Status: DC | PRN

## 2019-07-06 MED ORDER — MUPIROCIN 2 % EX OINT: 1.0000 "application " | TOPICAL_OINTMENT | Freq: Once | CUTANEOUS | Status: DC

## 2019-07-06 MED ORDER — HYDRALAZINE HCL 20 MG/ML IJ SOLN: 5.0000 mg | INTRAMUSCULAR | Status: DC | PRN

## 2019-07-06 MED ORDER — MORPHINE SULFATE (PF) 2 MG/ML IV SOLN: 2.0000 mg | INTRAVENOUS | Status: DC | PRN

## 2019-07-06 MED ORDER — ACETAMINOPHEN 325 MG PO TABS: 325.0000 mg | ORAL_TABLET | ORAL | Status: DC | PRN

## 2019-07-06 MED ORDER — OXYCODONE-ACETAMINOPHEN 5-325 MG PO TABS: 1.0000 | ORAL_TABLET | ORAL | Status: DC | PRN

## 2019-07-06 MED ORDER — POTASSIUM CHLORIDE CRYS ER 20 MEQ PO TBCR: 20.0000 meq | EXTENDED_RELEASE_TABLET | Freq: Every day | ORAL | Status: DC | PRN

## 2019-07-06 MED ORDER — LIDOCAINE HCL (CARDIAC) PF 100 MG/5ML IV SOSY
PREFILLED_SYRINGE | INTRAVENOUS | Status: DC | PRN
  Administered 2019-07-06: 60 mg via INTRAVENOUS

## 2019-07-06 MED ORDER — ACETAMINOPHEN 650 MG RE SUPP: 325.0000 mg | RECTAL | Status: DC | PRN

## 2019-07-06 MED ORDER — GUAIFENESIN-DM 100-10 MG/5ML PO SYRP: 15.0000 mL | ORAL_SOLUTION | ORAL | Status: DC | PRN

## 2019-07-06 MED ORDER — MUPIROCIN 2 % EX OINT
TOPICAL_OINTMENT | CUTANEOUS | Status: AC
  Filled 2019-07-06: qty 22

## 2019-07-06 MED ORDER — ADULT MULTIVITAMIN W/MINERALS CH
1.0000 | ORAL_TABLET | Freq: Every day | ORAL | Status: DC
  Filled 2019-07-06: qty 1

## 2019-07-06 MED ORDER — ALUM & MAG HYDROXIDE-SIMETH 200-200-20 MG/5ML PO SUSP: 15.0000 mL | ORAL | Status: DC | PRN

## 2019-07-06 MED ORDER — SODIUM CHLORIDE 0.9 % IV SOLN
2.0000 g | Freq: Three times a day (TID) | INTRAVENOUS | Status: DC
  Administered 2019-07-07 (×2): 2 g via INTRAVENOUS
  Filled 2019-07-06 (×5): qty 2

## 2019-07-06 MED ORDER — FENTANYL CITRATE (PF) 250 MCG/5ML IJ SOLN
INTRAMUSCULAR | Status: AC
  Filled 2019-07-06: qty 5

## 2019-07-06 MED ORDER — POLYETHYLENE GLYCOL 3350 17 G PO PACK: 17.0000 g | PACK | Freq: Every day | ORAL | Status: DC | PRN

## 2019-07-06 MED ORDER — DOCUSATE SODIUM 100 MG PO CAPS
100.0000 mg | ORAL_CAPSULE | Freq: Every day | ORAL | Status: DC
  Administered 2019-07-07: 100 mg via ORAL
  Filled 2019-07-06 (×2): qty 1

## 2019-07-06 MED ORDER — PHENYLEPHRINE HCL (PRESSORS) 10 MG/ML IV SOLN
INTRAVENOUS | Status: DC | PRN
  Administered 2019-07-06: 120 ug via INTRAVENOUS

## 2019-07-06 MED ORDER — ONDANSETRON HCL 4 MG/2ML IJ SOLN: 4.0000 mg | Freq: Four times a day (QID) | INTRAMUSCULAR | Status: DC | PRN

## 2019-07-06 MED ORDER — EPHEDRINE SULFATE 50 MG/ML IJ SOLN
INTRAMUSCULAR | Status: DC | PRN
  Administered 2019-07-06: 5 mg via INTRAVENOUS
  Administered 2019-07-06: 10 mg via INTRAVENOUS
  Administered 2019-07-06: 15 mg via INTRAVENOUS

## 2019-07-06 MED ORDER — PHENOL 1.4 % MT LIQD: 1.0000 | OROMUCOSAL | Status: DC | PRN

## 2019-07-06 SURGICAL SUPPLY — 29 items
BNDG GAUZE ELAST 4 BULKY (GAUZE/BANDAGES/DRESSINGS) IMPLANT
CANISTER SUCT 3000ML PPV (MISCELLANEOUS) ×3 IMPLANT
CANISTER WOUND CARE 500ML ATS (WOUND CARE) ×3 IMPLANT
COVER SURGICAL LIGHT HANDLE (MISCELLANEOUS) ×1 IMPLANT
COVER WAND RF STERILE (DRAPES) ×1 IMPLANT
DRAPE ORTHO SPLIT 77X108 STRL (DRAPES) ×6
DRAPE SURG ORHT 6 SPLT 77X108 (DRAPES) IMPLANT
DRSG VAC ATS SM SENSATRAC (GAUZE/BANDAGES/DRESSINGS) ×1 IMPLANT
ELECT REM PT RETURN 9FT ADLT (ELECTROSURGICAL) ×3
ELECTRODE REM PT RTRN 9FT ADLT (ELECTROSURGICAL) ×2 IMPLANT
GAUZE SPONGE 4X4 12PLY STRL (GAUZE/BANDAGES/DRESSINGS) ×2 IMPLANT
GLOVE BIO SURGEON STRL SZ7.5 (GLOVE) ×3 IMPLANT
GOWN STRL REUS W/ TWL LRG LVL3 (GOWN DISPOSABLE) ×5 IMPLANT
GOWN STRL REUS W/TWL LRG LVL3 (GOWN DISPOSABLE) ×6
KIT BASIN OR (CUSTOM PROCEDURE TRAY) ×3 IMPLANT
KIT TURNOVER KIT B (KITS) ×3 IMPLANT
NS IRRIG 1000ML POUR BTL (IV SOLUTION) ×3 IMPLANT
PACK GENERAL/GYN (CUSTOM PROCEDURE TRAY) ×3 IMPLANT
PACK UNIVERSAL I (CUSTOM PROCEDURE TRAY) ×2 IMPLANT
PAD ARMBOARD 7.5X6 YLW CONV (MISCELLANEOUS) ×6 IMPLANT
STAPLER VISISTAT 35W (STAPLE) IMPLANT
SUT ETHILON 3 0 PS 1 (SUTURE) ×1 IMPLANT
SUT VIC AB 2-0 CTX 36 (SUTURE) IMPLANT
SUT VIC AB 3-0 SH 27 (SUTURE)
SUT VIC AB 3-0 SH 27X BRD (SUTURE) IMPLANT
SUT VICRYL 4-0 PS2 18IN ABS (SUTURE) IMPLANT
SYR BULB IRRIGATION 50ML (SYRINGE) ×1 IMPLANT
TOWEL GREEN STERILE (TOWEL DISPOSABLE) ×3 IMPLANT
WATER STERILE IRR 1000ML POUR (IV SOLUTION) ×3 IMPLANT

## 2019-07-06 NOTE — Progress Notes (Signed)
Pharmacy Antibiotic Note  Edward Campos is a 86 y.o. male admitted on 07/06/2019 with a L-groin infection after recent LLE vascular surgery. The patient is s/p I&D today with wound vac placed. Pharmacy has been consulted for Vancomycin + Cefepime dosing.  The patient received a dose of Vancomycin pre-op at 0910 and has yet to receive any Cefepime doses. Noted penicillin allergy but the patient appears to have tolerated cephalosporins previously.  SCr 0.8, CrCl~60-70 ml/min  Plan: - Start Vancomycin 1500 mg IV every 24 hours (est AUC 467, SCr 0.8, Vd 0.72) - Start Cefepime 2g IV every 8 hours - Will continue to follow renal function, culture results, LOT, and antibiotic de-escalation plans   Height: 5\' 9"  (175.3 cm) Weight: 72.6 kg (160 lb) IBW/kg (Calculated) : 70.7  Temp (24hrs), Avg:97.6 F (36.4 C), Min:97.2 F (36.2 C), Max:97.7 F (36.5 C)  Recent Labs  Lab 07/06/19 0919  CREATININE 0.80    Estimated Creatinine Clearance: 68.7 mL/min (by C-G formula based on SCr of 0.8 mg/dL).    Allergies  Allergen Reactions  . Penicillins Other (See Comments)    UNSPECIFIED REACTION  Did it involve swelling of the face/tongue/throat, SOB, or low BP? Unknown Did it involve sudden or severe rash/hives, skin peeling, or any reaction on the inside of your mouth or nose? Unknown Did you need to seek medical attention at a hospital or doctor's office? Unknown When did it last happen?Between ages 23-13 If all above answers are "NO", may proceed with cephalosporin use.     Antimicrobials this admission: Vanc 4/26 >> Zosyn 4/26 >>  Dose adjustments this admission: n/a  Microbiology results: 4/26 MRSA PCR >> neg 4/26 Wound Cx (L-groin)  >>   Thank you for allowing pharmacy to be a part of this patient's care.  5/26, PharmD, BCPS Clinical Pharmacist Clinical phone for 07/06/2019: [phone] 07/06/2019 3:43 PM   **Pharmacist phone directory can now be found on [email or web address]  (PW [email or web address]).  Listed under [facility].

## 2019-07-06 NOTE — Anesthesia Procedure Notes (Signed)
Procedure Name: LMA Insertion Date/Time: 07/06/2019 9:45 AM Performed by: Marena Chancy, CRNA Pre-anesthesia Checklist: Patient identified, Emergency Drugs available, Suction available and Patient being monitored Patient Re-evaluated:Patient Re-evaluated prior to induction Oxygen Delivery Method: Circle System Utilized Preoxygenation: Pre-oxygenation with 100% oxygen Induction Type: IV induction Ventilation: Mask ventilation without difficulty LMA: LMA inserted LMA Size: 4.0 Number of attempts: 1 Airway Equipment and Method: Bite block Placement Confirmation: positive ETCO2 Tube secured with: Tape Dental Injury: Teeth and Oropharynx as per pre-operative assessment

## 2019-07-06 NOTE — Care Management (Signed)
Received a call from [facility] with [employer]. Home [facility] has been approved and will be delivered to patient's hospital room tomorrow 07/07/19.  Went to talk with patient regarding home health and home VAC. Patient currently busy with other hospital staff. Will follow up   Ronny Flurry RN

## 2019-07-06 NOTE — TOC Initial Note (Signed)
Transition of Care [facility]) - Initial/Assessment Note    Patient Details  Name: Edward Campos MRN: [id number] Date of Birth: March 20, 1932  Transition of Care [facility]) CM/SW Contact:    Marilu Favre, RN Phone Number: 07/06/2019, 5:04 PM  Clinical Narrative:                 Spoke to patient and daughter at bedside.   [facility] home VAC already in room. Explained hospital nurse will change him from hospital VAC to home [facility] , showed him home VAC much smaller than hospital pump. Patient already active with [facility]. HHRN order received will need HHRN instructions (frequency to change and amount of suction).   Cassie with [facility] aware patient has been admitted and will be discharging with VAC.  Expected Discharge Plan: [place] Barriers to Discharge: Continued Medical Work up   Patient Goals and CMS Choice Patient states their goals for this hospitalization and ongoing recovery are:: to return to home CMS Medicare.gov Compare Post Acute Care list provided to:: Patient Choice offered to / list presented to : Patient, Adult Children  Expected Discharge Plan and Services Expected Discharge Plan: [place]   Discharge Planning Services: CM Consult Post Acute Care Choice: Durable Medical Equipment Living arrangements for the past 2 months: Single Family Home                 DME Arranged: Vac DME Agency: [facility] Date DME Agency Contacted: 07/06/19 Time DME Agency Contacted: 45 Representative spoke with at DME Agency: Olivia Mackie HH Arranged: RN [place] Agency: [facility] [place] Date [place]: 07/06/19 Time [place]: 49 Representative spoke with at [place] Arrangements/Services Living arrangements for the past 2 months: [place] with:: Self Patient language and need for interpreter reviewed:: Yes Do you feel safe going back to the place where you live?: Yes      Need for Family  Participation in Patient Care: Yes (Comment) Care giver support system in place?: Yes (comment) Current home services: DME, Home RN Criminal Activity/Legal Involvement Pertinent to Current Situation/Hospitalization: No - Comment as needed  Activities of Daily Living Home Assistive Devices/Equipment: Dentures (specify type)(full top) ADL Screening (condition at time of admission) Patient's cognitive ability adequate to safely complete daily activities?: Yes Is the patient deaf or have difficulty hearing?: No Does the patient have difficulty seeing, even when wearing glasses/contacts?: No Does the patient have difficulty concentrating, remembering, or making decisions?: No Patient able to express need for assistance with ADLs?: Yes Does the patient have difficulty dressing or bathing?: No Independently performs ADLs?: Yes (appropriate for developmental age) Does the patient have difficulty walking or climbing stairs?: No Weakness of Legs: None Weakness of Arms/Hands: None  Permission Sought/Granted   Permission granted to share information with : Yes, Verbal Permission Granted  Share Information with NAME: Altha Harm daughter           Emotional Assessment Appearance:: Appears stated age Attitude/Demeanor/Rapport: Engaged Affect (typically observed): Accepting Orientation: : Oriented to Self, Oriented to Place, Oriented to  Time, Oriented to Situation Alcohol / Substance Use: Not Applicable Psych Involvement: No (comment)  Admission diagnosis:  Non-healing left groin open wound, initial encounter [S31.104A] Patient Active Problem List   Diagnosis Date Noted  . Non-healing left groin open wound, initial encounter 07/06/2019  . Status post insertion of iliac artery stent 06/08/2019  . Peripheral vascular disease ([place]) 06/08/2019   PCP:  Patient, No Pcp Per Pharmacy:   [id number] - [place], [place] - [address] [place] [address] Phone: [phone] Fax:  [phone]  [id number] - [place], [place] - [address] AT [facility] [address] [place] [address] Phone: [phone] Fax: [phone]     Social Determinants of Health (SDOH) Interventions    Readmission Risk Interventions No flowsheet data found.

## 2019-07-06 NOTE — Op Note (Signed)
Procedure: Incision and drainage left groin placement of VAC dressing  Preoperative diagnosis: Seroma left groin  Postoperative diagnosis: Same  Anesthesia: General  Assistant: Nurse  Operative findings: Clear fluid seroma under pressure proximally 20 cc  No graft currently exposed with granulation tissue over this  No evidence of obvious infection  Specimens: Cultures from left groin  Operative details: Informed consent: The patient taken the operating.  The patient placed supine position operating table.  After induction general esthesia placed in a laryngeal mask patient's entire left lower extremities prepped and draped in sterile fashion.  Next a longitudinal incision was made through pre-existing incision in the left groin.  There was a clear fluid evacuated under pressure.  There is no obvious purulent collection.  The wound was inspected and the graft was palpable at the base of the incision but was not exposed and had a layer of granulation tissue over top of it.  The wound was then thoroughly irrigated with 4 L of normal saline solution.  Placed several interrupted nylon sutures the upper half of the incision.  A VAC dressing was then placed over the opening of the lower half of the left groin wound the wound was about 4 x 3 cm x 2cm depth.  Back seal was good and this was placed at 100 mmHg pressure.  Patient tolerated procedure well there were no complications. Sponge and needle counts correct in the case.  Patient was taken to recovery in stable condition.  Fabienne Bruns, MD [facility] [place] Office: [phone]

## 2019-07-06 NOTE — Anesthesia Preprocedure Evaluation (Addendum)
Anesthesia Evaluation  Patient identified by MRN, date of birth, ID band Patient awake    Reviewed: Allergy & Precautions, NPO status , Patient's Chart, lab work & pertinent test results  Airway Mallampati: II  TM Distance: >3 FB Neck ROM: Full    Dental  (+) Edentulous Upper, Missing, Dental Advisory Given,    Pulmonary neg pulmonary ROS, Patient abstained from smoking., former smoker,    Pulmonary exam normal breath sounds clear to auscultation       Cardiovascular + Peripheral Vascular Disease (s/p fem fem bypass)  Normal cardiovascular exam Rhythm:Regular Rate:Normal  HLD  TTE 05/2019 1. Left ventricular ejection fraction, by estimation, is 70 to 75%. The left ventricle has hyperdynamic function. The left ventricle has no regional wall motion abnormalities. There is mild left ventricular hypertrophy. Left ventricular diastolic parameters are consistent with Grade I diastolic dysfunction (impaired relaxation).  2. Right ventricular systolic function is normal. The right ventricular  size is normal.  3. The mitral valve is normal in structure. No evidence of mitral valve regurgitation. No evidence of mitral stenosis.  4. The aortic valve was not well visualized. Aortic valve regurgitation is not visualized. No aortic stenosis is present.  5. The inferior vena cava is normal in size with greater than 50% respiratory variability, suggesting right atrial pressure of 3 mmHg  Stress Test 05/2019 The left ventricular ejection fraction is normal (55-65%). Nuclear stress EF: 55%. There was no ST segment deviation noted during stress. Defect 1: There is a small defect of mild severity present in the apex location. The study is normal. This is a low risk study. Normal stress nuclear study with mild apical thinning but no ischemia.  Gated ejection fraction 55% with normal wall motion.   Neuro/Psych negative neurological ROS   negative psych ROS   GI/Hepatic negative GI ROS, Neg liver ROS,   Endo/Other  negative endocrine ROS  Renal/GU negative Renal ROS  negative genitourinary   Musculoskeletal  (+) Arthritis ,   Abdominal   Peds  Hematology  (+) Blood dyscrasia (on plavix), ,   Anesthesia Other Findings   Reproductive/Obstetrics                            Anesthesia Physical Anesthesia Plan  ASA: III  Anesthesia Plan: General   Post-op Pain Management:    Induction: Intravenous  PONV Risk Score and Plan: Ondansetron, Dexamethasone and Midazolam  Airway Management Planned: LMA  Additional Equipment:   Intra-op Plan:   Post-operative Plan: Extubation in OR  Informed Consent: I have reviewed the patients History and Physical, chart, labs and discussed the procedure including the risks, benefits and alternatives for the proposed anesthesia with the patient or authorized representative who has indicated his/her understanding and acceptance.     Dental advisory given  Plan Discussed with: CRNA  Anesthesia Plan Comments:         Anesthesia Quick Evaluation

## 2019-07-06 NOTE — Interval H&P Note (Signed)
History and Physical Interval Note:  07/06/2019 9:17 AM  Edward Campos  has presented today for surgery, with the diagnosis of PERIPHERAL ARTERY DISEASE.  The various methods of treatment have been discussed with the patient and family. After consideration of risks, benefits and other options for treatment, the patient has consented to  Procedure(s): INCISION AND DRAINAGE OF LEFT GROIN WITH POSSIBLE WOUND VAC PLACEMENT (Left) as a surgical intervention.  The patient's history has been reviewed, patient examined, no change in status, stable for surgery.  I have reviewed the patient's chart and labs.  Questions were answered to the patient's satisfaction.     Fabienne Bruns

## 2019-07-06 NOTE — Transfer of Care (Signed)
Immediate Anesthesia Transfer of Care Note  Patient: Edward Campos  Procedure(s) Performed: INCISION AND DRAINAGE OF LEFT GROIN (Left ) Application Of Wound Vac Left Groin  Patient Location: PACU  Anesthesia Type:General  Level of Consciousness: awake, alert  and oriented  Airway & Oxygen Therapy: Patient Spontanous Breathing and Patient connected to nasal cannula oxygen  Post-op Assessment: Report given to RN, Post -op Vital signs reviewed and stable and Patient moving all extremities X 4  Post vital signs: Reviewed and stable  Last Vitals:  Vitals Value Taken Time  BP 121/79 07/06/19 1036  Temp    Pulse 70 07/06/19 1041  Resp 12 07/06/19 1041  SpO2 100 % 07/06/19 1041  Vitals shown include unvalidated device data.  Last Pain:  Vitals:   07/06/19 0906  TempSrc:   PainSc: 0-No pain         Complications: No apparent anesthesia complications

## 2019-07-07 DIAGNOSIS — S31104A Unspecified open wound of abdominal wall, left lower quadrant without penetration into peritoneal cavity, initial encounter: Secondary | ICD-10-CM | POA: Diagnosis not present

## 2019-07-07 LAB — CBC
HCT: 34.1 % — ABNORMAL LOW (ref 39.0–52.0)
Hemoglobin: 11.7 g/dL — ABNORMAL LOW (ref 13.0–17.0)
MCH: 31.4 pg (ref 26.0–34.0)
MCHC: 34.3 g/dL (ref 30.0–36.0)
MCV: 91.4 fL (ref 80.0–100.0)
Platelets: 198 10*3/uL (ref 150–400)
RBC: 3.73 MIL/uL — ABNORMAL LOW (ref 4.22–5.81)
RDW: 13.4 % (ref 11.5–15.5)
WBC: 7.1 10*3/uL (ref 4.0–10.5)
nRBC: 0 % (ref 0.0–0.2)

## 2019-07-07 LAB — BASIC METABOLIC PANEL
Anion gap: 5 (ref 5–15)
BUN: 13 mg/dL (ref 8–23)
CO2: 27 mmol/L (ref 22–32)
Calcium: 8.4 mg/dL — ABNORMAL LOW (ref 8.9–10.3)
Chloride: 103 mmol/L (ref 98–111)
Creatinine, Ser: 0.93 mg/dL (ref 0.61–1.24)
GFR calc Af Amer: >60 mL/min (ref >60)
GFR calc non Af Amer: >60 mL/min (ref >60)
Glucose, Bld: 102 mg/dL — ABNORMAL HIGH (ref 70–99)
Potassium: 3.9 mmol/L (ref 3.5–5.1)
Sodium: 135 mmol/L (ref 135–145)

## 2019-07-07 MED ORDER — OXYCODONE-ACETAMINOPHEN 5-325 MG PO TABS: 1.0000 | ORAL_TABLET | Freq: Four times a day (QID) | ORAL | 0 refills | Status: DC | PRN

## 2019-07-07 MED ORDER — CEPHALEXIN 500 MG PO CAPS: 500.0000 mg | ORAL_CAPSULE | Freq: Three times a day (TID) | ORAL | 0 refills | Status: DC

## 2019-07-07 NOTE — Discharge Instructions (Signed)
Negative Pressure Wound Therapy Home Guide °Negative pressure wound therapy (NPWT) uses a sponge or foam-like material (dressing) placed on or inside the wound. The wound is then covered and sealed with a cover dressing that sticks to your skin (is adhesive). This keeps air out. A tube is attached to the cover dressing, and this tube connects to a small pump. The pump sucks fluid and germs from the wound. NPWT helps to increase blood flow to the wound and heal it from the inside. °What are the risks? °NPWT is usually safe to use. However, problems can occur, including: °· Skin irritation from the dressing adhesive. °· Bleeding. °· Infection. °· Dehydration. Wounds with large amounts of drainage can cause excessive fluid loss. °· Pain. °Supplies needed: °· A disposable garbage bag. °· Soap and water, or hand sanitizer. °· Wound cleanser or salt-water solution (saline). °· New sponge and cover dressing. °· Protective clothing. °· Gauze pad. °· Vinyl gloves. °· Tape. °· Skin protectant. This may be a wipe, film, or spray. °· Clean or germ-free (sterile) scissors. °· Eye protection. °How to change your dressing °Prepare to change your dressing ° °1. If told by your health care provider, take pain medicine 30 minutes before changing the dressing. °2. Wash your hands with soap and water. Dry your hands with a clean towel. If soap and water are not available, use hand sanitizer. °3. Set up a clean station for wound care. °4. Open the dressing package so that the sponge dressing remains on the inside of the package. °5. Wear gloves, protective clothing, and eye protection. °Remove old dressing ° °1. Turn off the pump and disconnect the tubing from the dressing. °2. Carefully remove the adhesive cover dressing in the direction of your hair growth. °3. Remove the sponge dressing that is inside the wound. If the sponge sticks, use a wound cleanser or saline solution to wet the sponge and help it come off more easily. °4. Throw  the old sponge and cover dressing supplies into the garbage bag. °5. Remove your gloves by grabbing the cuff and turning the glove inside out. Place the gloves in the trash immediately. °6. Wash your hands with soap and water. Dry your hands with a clean towel. If soap and water are not available, use hand sanitizer. °Clean your wound °· Wear gloves, protective clothing, and eye protection. °Follow your health care provider's instructions on how to clean your wound. You may be told to: °1. Clean the wound using a saline solution or a wound cleanser and a clean gauze pad. °2. Pat the wound dry with a gauze pad. Do not rub the wound. °3. Throw the gauze pad into the garbage bag. °4. Remove your gloves by grabbing the cuff and turning the glove inside out. Place the gloves in the trash immediately. °5. Wash your hands with soap and water. Dry your hands with a clean towel. If soap and water are not available, use hand sanitizer. °Apply new dressing °· Wear gloves, protective clothing, and eye protection. °1. If told by your health care provider, apply a skin protectant to any skin that will be exposed to adhesive. Let the skin protectant dry. °2. Cut a piece of new sponge dressing and put it on or in the wound. °3. Using clean scissors, cut a nickel-sized hole in the new cover dressing. °4. Apply the cover dressing. °5. Attach the suction tube over the hole in the cover dressing. °6. Take off your gloves. Put them in the   plastic bag with the old dressing. Tie the bag shut and throw it away. °7. Wash your hands with soap and water. Dry your hands with a clean towel. If soap and water are not available, use hand sanitizer. °8. Turn the pump back on. The sponge dressing should collapse. Do not change the settings on the machine without talking to a health care provider. °9. Replace the container in the pump that collects fluid if it is full. Replace the container per the manufacturer's instructions or at least once a  week, even if it is not full. °General tips and recommendations °If the alarm sounds: °· Stay calm. °· Do not turn off the pump or do anything with the dressing. °· Reasons the alarm may go off: °? The battery is low. Change the battery or plug the device into electrical power. °? The dressing has a leak. Find the leak and put tape over the leak. °? The fluid collection container is full. Change the fluid container. °· Call your health care provider right away if you cannot fix the problem. °· Explain to your health care provider what is happening. Follow his or her instructions. °General instructions °· Do not turn off the pump unless told to do so by your health care provider. °· Do not turn off the pump for more than 2 hours. If the pump is off for more than 2 hours, the dressing will need to be changed. °· If your health care provider says it is okay to shower: °? Do not take the pump into the shower. °? Make sure the wound dressing is protected and sealed. The wound dressing must stay dry. °· Check frequently that the machine indicates that therapy is on and that all clamps are open. °· Do not use over-the-counter medicated or antiseptic creams, sprays, liquids, or dressings unless your health care provider approves. °Contact a health care provider if: °· You have new pain. °· You develop irritation, a rash, or itching around the wound or dressing. °· You see new black or yellow tissue in your wound. °· The dressing changes are painful or cause bleeding. °· The pump has been off for more than 2 hours, and you do not know how to change the dressing. °· The pump alarm goes off, and you do not know what to do. °Get help right away if: °· You have a lot of bleeding. °· The wound breaks open. °· You have severe pain. °· You have signs of infection, such as: °? More redness, swelling, or pain. °? More fluid or blood. °? Warmth. °? Pus or a bad smell. °? Red streaks leading from the wound. °? A fever. °· You see a  sudden change in the color or texture of the drainage. °· You have signs of dehydration, such as: °? Little or no tears, urine, or sweat. °? Muscle cramps. °? Very dry mouth. °? Headache. °? Dizziness. °Summary °· Negative pressure wound therapy (NPWT) is a device that helps your wound heal. °· Set up a clean station for wound care. Your health care provider will tell you what supplies to use. °· Follow your health care provider's instructions on how to clean your wound and how to change the dressing. °· Contact a health care provider if you have new pain, an irritation, or a rash, or if the alarm goes off and you do not know what to do. °· Get help right away if you have a lot of bleeding, your wound breaks   open, or you have severe pain. Also, get help if you have signs of infection. °This information is not intended to replace advice given to you by your health care provider. Make sure you discuss any questions you have with your health care provider. °Document Revised: 06/20/2018 Document Reviewed: 05/16/2018 °Elsevier Patient Education © 2020 [employer] ° °

## 2019-07-07 NOTE — Plan of Care (Signed)
  Problem: Education: Goal: Knowledge of General Education information will improve Description: Including pain rating scale, medication(s)/side effects and non-pharmacologic comfort measures Outcome: Progressing   Problem: Health Behavior/Discharge Planning: Goal: Ability to manage health-related needs will improve Outcome: Progressing   Problem: Nutrition: Goal: Adequate nutrition will be maintained Outcome: Progressing   

## 2019-07-07 NOTE — Anesthesia Postprocedure Evaluation (Signed)
Anesthesia Post Note  Patient: Dalonte Connaughton  Procedure(s) Performed: INCISION AND DRAINAGE OF LEFT GROIN (Left ) Application Of Wound Vac Left Groin     Patient location during evaluation: PACU Anesthesia Type: General Level of consciousness: awake and alert Pain management: pain level controlled Vital Signs Assessment: post-procedure vital signs reviewed and stable Respiratory status: spontaneous breathing, nonlabored ventilation, respiratory function stable and patient connected to nasal cannula oxygen Cardiovascular status: blood pressure returned to baseline and stable Postop Assessment: no apparent nausea or vomiting Anesthetic complications: no    Last Vitals:  Vitals:   07/07/19 0815 07/07/19 1234  BP: (!) 120/58 138/76  Pulse: 60 (!) 57  Resp: 18 16  Temp: 36.4 C 36.7 C  SpO2: 100% 100%    Last Pain:  Vitals:   07/07/19 1234  TempSrc: Oral  PainSc:    Pain Goal:                   Kaidyn Javid L Tamaj Jurgens

## 2019-07-07 NOTE — Progress Notes (Addendum)
  Progress Note    07/07/2019 7:43 AM 1 Day Post-Op  Subjective:  Soreness L groin but tolerable.  Wants to go home.   Vitals:   07/07/19 0133 07/07/19 0528  BP: 104/64 109/68  Pulse: 65 61  Resp: 18 18  Temp: 98 F (36.7 C) (!) 97.4 F (36.3 C)  SpO2: 100% 98%   Physical Exam: Lungs:  Non labored Incisions:  L groin incision with vac in place with good seal Extremities:  Palpable L DP pulse Neurologic: a&O  CBC    Component Value Date/Time   WBC 7.1 07/07/2019 0259   RBC 3.73 (L) 07/07/2019 0259   HGB 11.7 (L) 07/07/2019 0259   HCT 34.1 (L) 07/07/2019 0259   PLT 198 07/07/2019 0259   MCV 91.4 07/07/2019 0259   MCH 31.4 07/07/2019 0259   MCHC 34.3 07/07/2019 0259   RDW 13.4 07/07/2019 0259   LYMPHSABS 2.0 10/10/2010 1100   MONOABS 0.8 10/10/2010 1100   EOSABS 0.3 10/10/2010 1100   BASOSABS 0.1 10/10/2010 1100    BMET    Component Value Date/Time   NA 135 07/07/2019 0259   K 3.9 07/07/2019 0259   CL 103 07/07/2019 0259   CO2 27 07/07/2019 0259   GLUCOSE 102 (H) 07/07/2019 0259   BUN 13 07/07/2019 0259   CREATININE 0.93 07/07/2019 0259   CALCIUM 8.4 (L) 07/07/2019 0259   GFRNONAA >60 07/07/2019 0259   GFRAA >60 07/07/2019 0259    INR    Component Value Date/Time   INR 1.1 06/04/2019 1016     Intake/Output Summary (Last 24 hours) at 07/07/2019 0743 Last data filed at 07/07/2019 0600 Gross per 24 hour  Intake 1730.77 ml  Output 1820 ml  Net -89.23 ml     Assessment/Plan:  86 y.o. male is s/p I&D left groin with placement of VAC 1 Day Post-Op   Home vac arranged and delivered to room Patient on broad spectrum IV abx; cultures pending D/c home with [facility] with focused antibiotics when cultures return   Emilie Rutter, PA-C [facility] [phone] 07/07/2019 7:43 AM  Agree with above.  D/c home today with VAC. Will d/c on Keflex and follow up on cultures  Fabienne Bruns, MD [facility]  [place] Office: [phone]

## 2019-07-07 NOTE — Progress Notes (Signed)
Patient discharged to home with instructions given to patient and daughter Ms. Pursley,  Sent with them is a box of wound vac supplies and pt was hooked to the portable [place].

## 2019-07-08 NOTE — Discharge Summary (Signed)
Discharge Summary  Patient ID: Edward Campos [id number] 86 y.o. May 12, 1932  Admit date: 07/06/2019  Discharge date and time: 07/07/2019  2:05 PM   Admitting Physician: Sherren Kerns, MD   Discharge Physician: same  Admission Diagnoses: Non-healing left groin open wound, initial encounter [S31.104A]  Discharge Diagnoses: same  Admission Condition: fair  Discharged Condition: fair  Indication for Admission: Seroma left groin  Hospital Course: [name] is a 86 year old male who recently underwent right common femoral endarterectomy and stenting of right common and external iliac artery followed by a right to left femoral to femoral bypass with iliofemoral endarterectomy.  He developed a fluid collection in his left groin postoperatively.  He was brought in as an outpatient and underwent incision and drainage of left groin seroma with placement of wound VAC by Dr. Darrick Penna on 07/06/2019.  He tolerated the procedure well and was admitted overnight.  Transitions of care team was consulted to arrange home health RN with home VAC.  POD #1 patient was ready for discharge home.  He will follow-up in office in 2 to 3 weeks for evaluation of incision.  He was discharged with a Keflex regimen however this may be adjusted when cultures from operating room return.  He will also be discharged with 2 to 3 days of narcotic pain medication for continued postoperative pain control.  He was discharged home with home health in stable condition.  Consults: None  Treatments: surgery: Incision and drainage of left groin seroma with placement of wound VAC dressing by Dr. Darrick Penna on 07/06/2019  Discharge Exam: See progress note 07/07/2019 Vitals:   07/07/19 0815 07/07/19 1234  BP: (!) 120/58 138/76  Pulse: 60 (!) 57  Resp: 18 16  Temp: 97.6 F (36.4 C) 98 F (36.7 C)  SpO2: 100% 100%     Disposition: Discharge disposition: 01-Home or Self Care       Patient Instructions:  Allergies as  of 07/07/2019      Reactions   Penicillins Other (See Comments)   UNSPECIFIED REACTION  Did it involve swelling of the face/tongue/throat, SOB, or low BP? Unknown Did it involve sudden or severe rash/hives, skin peeling, or any reaction on the inside of your mouth or nose? Unknown Did you need to seek medical attention at a hospital or doctor's office? Unknown When did it last happen?Between ages 10-13 If all above answers are "NO", may proceed with cephalosporin use.      Medication List    STOP taking these medications   oxyCODONE 5 MG immediate release tablet Commonly known as: Oxy IR/ROXICODONE     TAKE these medications   aspirin EC 81 MG tablet Take 1 tablet (81 mg total) by mouth daily.   cephALEXin 500 MG capsule Commonly known as: KEFLEX Take 1 capsule (500 mg total) by mouth 3 (three) times daily. Start taking on: July 09, 2019   clopidogrel 75 MG tablet Commonly known as: PLAVIX Take 1 tablet (75 mg total) by mouth daily at 6 (six) AM. What changed: when to take this   multivitamin with minerals tablet Take 1 tablet by mouth daily with lunch.   oxyCODONE-acetaminophen 5-325 MG tablet Commonly known as: PERCOCET/ROXICET Take 1 tablet by mouth every 6 (six) hours as needed for moderate pain.   rosuvastatin 10 MG tablet Commonly known as: Crestor Take 2 tablets (20 mg total) by mouth daily.      Activity: activity as tolerated Diet: regular diet Wound Care: wound vac to be changed by  HHRN 3x/week  Follow-up with Dr. Oneida Alar in 3 weeks.  Signed: Dagoberto Ligas, PA-C 07/08/2019 8:53 AM [facility] Office: [phone]

## 2019-07-09 LAB — AEROBIC CULTURE W GRAM STAIN (SUPERFICIAL SPECIMEN): Culture: NO GROWTH

## 2019-07-29 ENCOUNTER — Telehealth (HOSPITAL_COMMUNITY): Payer: Self-pay

## 2019-07-29 ENCOUNTER — Telehealth: Payer: Self-pay

## 2019-07-29 NOTE — Telephone Encounter (Signed)

## 2019-07-29 NOTE — Telephone Encounter (Signed)
Nurse with [facility] called to report on visit today, wound vac was removed and noted incision almost closed. A wet to dry dressing was applied only and wound vac discontinued. Pt has a f/u appt on 5/20. Can call [phone] for any questions.

## 2019-07-30 ENCOUNTER — Ambulatory Visit (INDEPENDENT_AMBULATORY_CARE_PROVIDER_SITE_OTHER): Payer: Self-pay | Admitting: Vascular Surgery

## 2019-07-30 ENCOUNTER — Other Ambulatory Visit: Payer: Self-pay

## 2019-07-30 ENCOUNTER — Encounter: Payer: Self-pay | Admitting: Vascular Surgery

## 2019-07-30 VITALS — BP 126/78 | HR 62 | Temp 97.9°F | Resp 20 | Ht 69.0 in | Wt 164.0 lb

## 2019-07-30 DIAGNOSIS — I714 Abdominal aortic aneurysm, without rupture, unspecified: Secondary | ICD-10-CM

## 2019-07-30 DIAGNOSIS — I739 Peripheral vascular disease, unspecified: Secondary | ICD-10-CM

## 2019-07-30 NOTE — Progress Notes (Addendum)
Patient is an 86 year old male who returns for follow-up today.  He underwent right common and external iliac artery stenting and right to left femoral-femoral bypass with bilateral iliofemoral endarterectomies on June 08, 2019.  He subsequently required I&D of the left groin for a lymphocele.  He returns today for further follow-up.  His VAC therapy has been discontinued.  He has no significant left groin drainage at this point.  He has no rest pain in his feet and he is able to walk without significant claudication symptoms.  Physical exam:  Vitals:   07/30/19 1512  BP: 126/78  Pulse: 62  Resp: 20  Temp: 97.9 F (36.6 C)  SpO2: 97%  Weight: 164 lb (74.4 kg)  Height: 5\' 9"  (1.753 m)    Extremities: 2+ femoral pulses bilaterally healing left groin incision sutures removed today still some erythema around the incision which has a yeast type appearance probably from moisture from the VAC dressing.  Assessment: Healing left groin incision absent claudication symptoms at this point.  He also has a known abdominal aortic aneurysm.  This was 4.7 cm in diameter in December 2020.  Plan: Patient will follow up in late June for bilateral ABIs and to recheck his left groin.  He will return sooner if he has any significant drainage from the groin.  The patient did have a handicap permit filled out today.  He will also need an aortic ultrasound in June 2021.  He will need to continue his aspirin and Plavix indefinitely.  July 2021, MD [facility] Office: [phone]

## 2019-08-03 ENCOUNTER — Other Ambulatory Visit: Payer: Self-pay | Admitting: *Deleted

## 2019-08-03 DIAGNOSIS — I739 Peripheral vascular disease, unspecified: Secondary | ICD-10-CM

## 2019-09-03 ENCOUNTER — Other Ambulatory Visit: Payer: Self-pay

## 2019-09-03 ENCOUNTER — Encounter: Payer: Self-pay | Admitting: Vascular Surgery

## 2019-09-03 ENCOUNTER — Ambulatory Visit (INDEPENDENT_AMBULATORY_CARE_PROVIDER_SITE_OTHER): Payer: Self-pay | Admitting: Vascular Surgery

## 2019-09-03 ENCOUNTER — Ambulatory Visit (HOSPITAL_COMMUNITY)
Admission: RE | Admit: 2019-09-03 | Discharge: 2019-09-03 | Disposition: A | Discharge status: Home / Self Care | Payer: Medicare Other | Source: Ambulatory Visit | Attending: Surgery | Admitting: Surgery

## 2019-09-03 VITALS — BP 174/85 | HR 55 | Temp 97.7°F | Resp 20 | Ht 69.0 in | Wt 168.0 lb

## 2019-09-03 DIAGNOSIS — I739 Peripheral vascular disease, unspecified: Secondary | ICD-10-CM | POA: Diagnosis present

## 2019-09-03 MED ORDER — ROSUVASTATIN CALCIUM 20 MG PO TABS: 20.0000 mg | ORAL_TABLET | Freq: Every day | ORAL | 12 refills | Status: DC

## 2019-09-03 NOTE — Progress Notes (Signed)
Patient is an 86 year old male who returns for follow-up today.  He underwent right common and external iliac artery stenting and right to left femoral-femoral bypass with bilateral iliofemoral endarterectomies June 08, 2019.  He did require an I&D of the left groin for a lymphocele.  He returns today for further follow-up.  Physical exam:  Vitals:   09/03/19 1523 09/03/19 1528  BP: (!) 155/84 (!) 174/85  Pulse: (!) 55   Resp: 20   Temp: 97.7 F (36.5 C)   SpO2: 97%   Weight: 168 lb (76.2 kg)   Height: 5\' 9"  (1.753 m)     2 x 2 millimeter area of heaped up granulation tissue left groin no drainage no fluctuance no erythema easily palpable graft pulse  Data: Patient had bilateral ABIs performed today which I reviewed and interpreted.  Right side was 0.77 left side was 0.8  Assessment:Patient doing well with minimal claudication symptoms at this point both legs.   Plan: Patient will follow up in 1 month to recheck the left groin to make sure this is fully healed  Patient was given a new prescription for Crestor 20 mg once a day 42-month supply.  He states he has several refills still on his Plavix.  We will continue both of these.   2-month, MD [facility] Office: [phone]

## 2019-10-08 ENCOUNTER — Other Ambulatory Visit: Payer: Self-pay

## 2019-10-08 ENCOUNTER — Encounter: Payer: Self-pay | Admitting: Vascular Surgery

## 2019-10-08 ENCOUNTER — Ambulatory Visit (INDEPENDENT_AMBULATORY_CARE_PROVIDER_SITE_OTHER): Payer: Self-pay | Admitting: Vascular Surgery

## 2019-10-08 VITALS — BP 161/79 | HR 64 | Temp 97.5°F | Ht 69.0 in | Wt 167.9 lb

## 2019-10-08 DIAGNOSIS — I739 Peripheral vascular disease, unspecified: Secondary | ICD-10-CM

## 2019-10-08 NOTE — Progress Notes (Signed)
Patient is status post right common and external iliac stenting and right to left femoral-femoral bypass with iliofemoral endarterectomies June 08, 2019.  He subsequently had to have I&D of the left groin for a lymphocele.  He returns today for recheck of the left groin.  Reports no further drainage.  He reports no claudication symptoms at this point.  He is able to walk 6 laps around the house with a fertilizer spreader without difficulty.  Previously he was not able to make one lap.  He also has a known 4.7 cm infrarenal abdominal aortic aneurysm last measured by CT scan December 2020.  Physical exam:  Vitals:   10/08/19 1328  BP: (!) 161/79  Pulse: 64  Temp: (!) 97.5 F (36.4 C)  TempSrc: Skin  SpO2: 96%  Weight: 167 lb 14.4 oz (76.2 kg)  Height: 5\' 9"  (1.753 m)    Extremities: 2+ femoral pulses bilaterally groin incisions well-healed feet pink warm no palpable pedal pulses  Assessment: Doing well status post right common and external iliac artery stenting and femoral-femoral bypass.  Plan: Patient will follow up in our APP clinic in September of this year for an abdominal aortic aneurysm ultrasound as well as bilateral ABIs.  October, MD [facility] [place] Office: [phone]

## 2019-10-09 ENCOUNTER — Other Ambulatory Visit: Payer: Self-pay

## 2019-10-09 DIAGNOSIS — I739 Peripheral vascular disease, unspecified: Secondary | ICD-10-CM

## 2019-10-09 DIAGNOSIS — I714 Abdominal aortic aneurysm, without rupture, unspecified: Secondary | ICD-10-CM

## 2019-12-06 ENCOUNTER — Other Ambulatory Visit: Payer: Self-pay | Admitting: Physician Assistant

## 2019-12-08 ENCOUNTER — Ambulatory Visit (HOSPITAL_COMMUNITY)
Admission: RE | Admit: 2019-12-08 | Discharge: 2019-12-08 | Disposition: A | Discharge status: Home / Self Care | Payer: Medicare Other | Source: Ambulatory Visit | Attending: Vascular Surgery | Admitting: Vascular Surgery

## 2019-12-08 ENCOUNTER — Other Ambulatory Visit: Payer: Self-pay

## 2019-12-08 ENCOUNTER — Encounter: Payer: Self-pay | Admitting: Physician Assistant

## 2019-12-08 ENCOUNTER — Ambulatory Visit (INDEPENDENT_AMBULATORY_CARE_PROVIDER_SITE_OTHER)
Admission: RE | Admit: 2019-12-08 | Discharge: 2019-12-08 | Disposition: A | Discharge status: Home / Self Care | Payer: Medicare Other | Source: Ambulatory Visit | Attending: Vascular Surgery | Admitting: Vascular Surgery

## 2019-12-08 ENCOUNTER — Ambulatory Visit (INDEPENDENT_AMBULATORY_CARE_PROVIDER_SITE_OTHER): Payer: Medicare Other | Admitting: Physician Assistant

## 2019-12-08 VITALS — BP 147/80 | HR 51 | Temp 98.3°F | Resp 20 | Ht 69.0 in | Wt 165.6 lb

## 2019-12-08 DIAGNOSIS — I714 Abdominal aortic aneurysm, without rupture, unspecified: Secondary | ICD-10-CM

## 2019-12-08 DIAGNOSIS — I739 Peripheral vascular disease, unspecified: Secondary | ICD-10-CM | POA: Diagnosis present

## 2019-12-08 NOTE — Progress Notes (Signed)
History of Present Illness:  Patient is a 86 y.o. year old male who presents for evaluation of abdominal aortic aneurysm.  Previous AAA size 4.7 cm 02/2019 on Ct scan.   He denise abdominal and lumbar pain.    He is also status post right common and external iliac stenting and right to left femoral-femoral bypass with iliofemoral endarterectomies June 08, 2019.  He denise symptoms of claudication with daily activity, rest pain and non healing wounds.  He states his left LE feels 100% better.  He continues to take asa, Plavix and Crestor daily.      Past Medical History:  Diagnosis Date  . AAA (abdominal aortic aneurysm) (HCC)   . Anxiety   . Arthritis   . BPH (benign prostatic hyperplasia)   . Dizziness   . Hyperlipidemia     Past Surgical History:  Procedure Laterality Date  . ABDOMINAL AORTOGRAM W/LOWER EXTREMITY N/A 05/06/2019   Procedure: ABDOMINAL AORTOGRAM W/LOWER EXTREMITY;  Surgeon: Cephus Shelling, MD;  Location: [facility] INVASIVE CV LAB;  Service: Cardiovascular;  Laterality: N/A;  . APPLICATION OF WOUND VAC  07/06/2019   Procedure: Application Of Wound Vac Left Groin;  Surgeon: Sherren Kerns, MD;  Location: [facility] OR;  Service: Vascular;;  . ENDARTERECTOMY FEMORAL Bilateral 06/08/2019   Procedure: ENDARTERECTOMY RIGHT COMMON FEMORAL; ENDARTERECTOMY RIGHT EXTERNAL ILIAC; ENDARTERECTOMY LEFT COMMON FEMORAL; ENDARTERECTOMY LEFT EXTERNAL ILIAC;  Surgeon: Sherren Kerns, MD;  Location: [facility] OR;  Service: Vascular;  Laterality: Bilateral;  . FEMORAL-FEMORAL BYPASS GRAFT Bilateral 06/08/2019   Procedure: BYPASS GRAFT FEMORAL-FEMORAL ARTERY;  Surgeon: Sherren Kerns, MD;  Location: [facility] OR;  Service: Vascular;  Laterality: Bilateral;  . GROIN DEBRIDEMENT Left 07/06/2019   Procedure: INCISION AND DRAINAGE OF LEFT GROIN;  Surgeon: Sherren Kerns, MD;  Location: [facility] OR;  Service: Vascular;  Laterality: Left;  . growth removed from chest    . HEMORRHOID SURGERY    . HERNIA REPAIR   12 yrs ago   left inguinal hernia  . INCISE AND DRAIN ABCESS Left 07/06/2019   Incision and drainage left groin placement of VAC dressing  . INGUINAL HERNIA REPAIR  10/16/2010   Procedure: HERNIA REPAIR INGUINAL ADULT;  Surgeon: Dalia Heading;  Location: [facility] ORS;  Service: General;  Laterality: Right;  . INSERTION OF ILIAC STENT Right 06/08/2019   Procedure: INSERTION OF RIGHT COMMON ILIAC AND EXTERNAL ILICAC STENT;  Surgeon: Sherren Kerns, MD;  Location: [facility] OR;  Service: Vascular;  Laterality: Right;  . PATCH ANGIOPLASTY Right 06/08/2019   Procedure: Patch Angioplasty Right Common Femoral;  Surgeon: Sherren Kerns, MD;  Location: [facility] OR;  Service: Vascular;  Laterality: Right;     Social History Social History   Tobacco Use  . Smoking status: Former Smoker    Packs/day: 0.25    Years: 70.00    Pack years: 17.50    Types: Cigarettes    Quit date: 05/30/2019    Years since quitting: 0.5  . Smokeless tobacco: Never Used  Vaping Use  . Vaping Use: Never used  Substance Use Topics  . Alcohol use: Yes    Alcohol/week: 3.0 standard drinks    Types: 3 Shots of liquor per week    Comment: 3 2oz. shots of bourbon/day  . Drug use: No    Family History Family History  Problem Relation Age of Onset  . Stroke Mother   . Pseudochol deficiency Neg Hx   . Malignant hyperthermia Neg Hx   .  Hypotension Neg Hx   . Anesthesia problems Neg Hx     Allergies  Allergies  Allergen Reactions  . Penicillins Other (See Comments)    UNSPECIFIED REACTION  Did it involve swelling of the face/tongue/throat, SOB, or low BP? Unknown Did it involve sudden or severe rash/hives, skin peeling, or any reaction on the inside of your mouth or nose? Unknown Did you need to seek medical attention at a hospital or doctor's office? Unknown When did it last happen?Between ages 3-13 If all above answers are "NO", may proceed with cephalosporin use.      Current Outpatient Medications  Medication  Sig Dispense Refill  . aspirin EC 81 MG tablet Take 1 tablet (81 mg total) by mouth daily. 150 tablet 10  . clopidogrel (PLAVIX) 75 MG tablet TAKE 1 TABLET BY MOUTH EVERY DAY AT 6 AM 90 tablet 1  . Multiple Vitamins-Minerals (MULTIVITAMIN WITH MINERALS) tablet Take 1 tablet by mouth daily with lunch.     . raNITIdine HCl (ZANTAC PO) Take by mouth.    . rosuvastatin (CRESTOR) 20 MG tablet Take 1 tablet (20 mg total) by mouth daily. 90 tablet 12   No current facility-administered medications for this visit.    ROS:   General:  No weight loss, Fever, chills  HEENT: No recent headaches, no nasal bleeding, no visual changes, no sore throat  Neurologic: No dizziness, blackouts, seizures. No recent symptoms of stroke or mini- stroke. No recent episodes of slurred speech, or temporary blindness.  Cardiac: No recent episodes of chest pain/pressure, no shortness of breath at rest.  No shortness of breath with exertion.  Denies history of atrial fibrillation or irregular heartbeat  Vascular: No history of rest pain in feet.  No history of claudication.  No history of non-healing ulcer, No history of DVT   Pulmonary: No home oxygen, no productive cough, no hemoptysis,  No asthma or wheezing  Musculoskeletal:  [x ] Arthritis, [x ] Low back pain,  [ ]  Joint pain  Hematologic:No history of hypercoagulable state.  No history of easy bleeding.  No history of anemia  Gastrointestinal: No hematochezia or melena,  No gastroesophageal reflux, no trouble swallowing  Urinary: [ ]  chronic Kidney disease, [ ]  on HD - [ ]  MWF or [ ]  TTHS, [ ]  Burning with urination, [ ]  Frequent urination, [ ]  Difficulty urinating;   Skin: No rashes  Psychological: No history of anxiety,  No history of depression   Physical Examination  Vitals:   12/08/19 0956 12/08/19 0959  BP: (!) 163/77 (!) 147/80  Pulse: (!) 51   Resp: 20   Temp: 98.3 F (36.8 C)   TempSrc: Temporal   SpO2: 99%   Weight: 165 lb 9.6 oz (75.1  kg)   Height: 5\' 9"  (1.753 m)     Body mass index is 24.45 kg/m.  General:  Alert and oriented, no acute distress HEENT: Normal Neck: No bruit or JVD Pulmonary: Clear to auscultation bilaterally Cardiac: Regular Rate and Rhythm without murmur Gastrointestinal: Soft, non-tender, non-distended, no mass, no scars Skin: No rash Extremity Pulses:  2+ radial, brachial, femoral, dorsalis pedis, posterior tibial pulses bilaterally Musculoskeletal: No deformity or edema  Neurologic: Upper and lower extremity motor 5/5 and symmetric  DATA:  ABI Findings:  +---------+------------------+-----+----------+--------+  Right  Rt Pressure (mmHg)IndexWaveform Comment   +---------+------------------+-----+----------+--------+  Brachial 147                      +---------+------------------+-----+----------+--------+  PTA   129        0.85 monophasic      +---------+------------------+-----+----------+--------+  DP    122        0.80 monophasic      +---------+------------------+-----+----------+--------+  Great Toe108        0.71            +---------+------------------+-----+----------+--------+   +---------+------------------+-----+----------+-------+  Left   Lt Pressure (mmHg)IndexWaveform Comment  +---------+------------------+-----+----------+-------+  Brachial 152                      +---------+------------------+-----+----------+-------+  ATA   124        0.82 monophasic      +---------+------------------+-----+----------+-------+  DP    99        0.65 monophasic      +---------+------------------+-----+----------+-------+  Great Toe66        0.43            +---------+------------------+-----+----------+-------+   +-------+-----------+-----------+------------+------------+  ABI/TBIToday's  ABIToday's TBIPrevious ABIPrevious TBI  +-------+-----------+-----------+------------+------------+  Right 0.85    0.71    0.77    0.42      +-------+-----------+-----------+------------+------------+  Left  0.82    0.43    0.8     0.36      +-------+-----------+-----------+------------+------------+      Previous ABI 09/03/19.    Summary:  Right: Resting right ankle-brachial index indicates mild right lower  extremity arterial disease. The right toe-brachial index is normal. RT  great toe pressure = 108 mmHg.   Left: Resting left ankle-brachial index indicates mild left lower  extremity arterial disease. The left toe-brachial index is abnormal. LT  Great toe pressure = 66 mmHg.     Abdominal Aorta Findings:  +-----------+-------+----------+----------+--------+--------+--------+  Location  AP (cm)Trans (cm)PSV (cm/s)WaveformThrombusComments  +-----------+-------+----------+----------+--------+--------+--------+  Proximal  2.10  2.17   55                  +-----------+-------+----------+----------+--------+--------+--------+  Mid    2.66  2.91   49                  +-----------+-------+----------+----------+--------+--------+--------+  Distal   4.68  4.74   18                  +-----------+-------+----------+----------+--------+--------+--------+  RT CIA Prox1.0  1.3    155                  +-----------+-------+----------+----------+--------+--------+--------+  LT CIA Prox1.4  1.3    155                  +-----------+-------+----------+----------+--------+--------+--------+       Summary:  Abdominal Aorta: There is evidence of abnormal dilatation of the distal  Abdominal aorta. The largest aortic diameter has increased compared to  prior exam. Previous diameter measurement  was 4.6 x 4.61 cm obtained on  02/19/19.   ASSESSMENT:  PAD s/p right common and external iliac artery stenting and right to left femoral-femoral bypass with bilateral iliofemoral endarterectomies June 08, 2019. He did require an I&D of the left groin for a lymphocele.  He states he is doing well without symptoms of claudication or abd/lumbar pain.  His ABI's are stable  80% arterial flow.  AAA duplex is essentially  unchanged with largest diameter transverse of 4.7 cm.      PLAN:  He will continued activity as tolerates and f/u in 6 months for repeat AAA duplex and ABI's.  If he develops symptoms of sever  abd/lumbar pain or rest pain with/without non healing foot wounds he will call sooner.  He can discontinue his Plavix and stay on the [place] daily with Crestor.  He mentioned that he has had 3 episodes of getting food stuck in his throat the last episode was a month ago.  I recommended seeing GI and he declined this.    Mosetta PigeonEmma Maureen Harrol Novello PA-C [facility] [place] Office: [phone]  MD in clinic [facility] [place]

## 2019-12-09 ENCOUNTER — Other Ambulatory Visit: Payer: Self-pay | Admitting: *Deleted

## 2019-12-09 DIAGNOSIS — I714 Abdominal aortic aneurysm, without rupture, unspecified: Secondary | ICD-10-CM

## 2019-12-09 DIAGNOSIS — I739 Peripheral vascular disease, unspecified: Secondary | ICD-10-CM

## 2020-06-09 ENCOUNTER — Other Ambulatory Visit: Payer: Self-pay

## 2020-06-09 ENCOUNTER — Ambulatory Visit (HOSPITAL_COMMUNITY)
Admission: RE | Admit: 2020-06-09 | Discharge: 2020-06-09 | Disposition: A | Discharge status: Home / Self Care | Payer: Medicare Other | Source: Ambulatory Visit | Attending: Vascular Surgery | Admitting: Vascular Surgery

## 2020-06-09 ENCOUNTER — Ambulatory Visit (INDEPENDENT_AMBULATORY_CARE_PROVIDER_SITE_OTHER)
Admission: RE | Admit: 2020-06-09 | Discharge: 2020-06-09 | Disposition: A | Discharge status: Home / Self Care | Payer: Medicare Other | Source: Ambulatory Visit | Attending: Vascular Surgery | Admitting: Vascular Surgery

## 2020-06-09 ENCOUNTER — Ambulatory Visit (INDEPENDENT_AMBULATORY_CARE_PROVIDER_SITE_OTHER): Payer: Medicare Other | Admitting: Physician Assistant

## 2020-06-09 VITALS — BP 145/70 | HR 56 | Temp 97.6°F | Resp 20 | Ht 69.0 in | Wt 169.1 lb

## 2020-06-09 DIAGNOSIS — I739 Peripheral vascular disease, unspecified: Secondary | ICD-10-CM

## 2020-06-09 DIAGNOSIS — I714 Abdominal aortic aneurysm, without rupture, unspecified: Secondary | ICD-10-CM

## 2020-06-09 NOTE — Progress Notes (Signed)
Office Note     CC:  follow up Requesting Provider:  No ref. provider found  HPI: Edward Campos is a 87 y.o. (1932/09/01) male who presents for surveillance of PAD as well as abdominal aortic aneurysm.  Surgical history significant for right common and external iliac artery stenting with right to left femoral to femoral bypass and bilateral iliofemoral endarterectomies by Dr. Darrick Penna on June 08, 1999.  Patient has some mild claudication symptoms of both calfs after walking a distance or uphill however this does not affect his day-to-day life.  He denies any rest pain or nonhealing wounds of bilateral lower extremities.  He had been on Plavix and aspirin daily however discontinued both since last office visit due to easy bruising and bleeding.  He denies tobacco use.  He is also followed for surveillance of his abdominal aortic aneurysm.  6 months ago the AAA measured 4.7 cm at his greatest diameter based on ultrasound.  He denies new or changing abdominal or back pain.  Past Medical History:  Diagnosis Date  . AAA (abdominal aortic aneurysm) (HCC)   . Anxiety   . Arthritis   . BPH (benign prostatic hyperplasia)   . Dizziness   . Hyperlipidemia     Past Surgical History:  Procedure Laterality Date  . ABDOMINAL AORTOGRAM W/LOWER EXTREMITY N/A 05/06/2019   Procedure: ABDOMINAL AORTOGRAM W/LOWER EXTREMITY;  Surgeon: Cephus Shelling, MD;  Location: [facility] INVASIVE CV LAB;  Service: Cardiovascular;  Laterality: N/A;  . APPLICATION OF WOUND VAC  07/06/2019   Procedure: Application Of Wound Vac Left Groin;  Surgeon: Sherren Kerns, MD;  Location: [facility] OR;  Service: Vascular;;  . ENDARTERECTOMY FEMORAL Bilateral 06/08/2019   Procedure: ENDARTERECTOMY RIGHT COMMON FEMORAL; ENDARTERECTOMY RIGHT EXTERNAL ILIAC; ENDARTERECTOMY LEFT COMMON FEMORAL; ENDARTERECTOMY LEFT EXTERNAL ILIAC;  Surgeon: Sherren Kerns, MD;  Location: [facility] OR;  Service: Vascular;  Laterality: Bilateral;  . FEMORAL-FEMORAL  BYPASS GRAFT Bilateral 06/08/2019   Procedure: BYPASS GRAFT FEMORAL-FEMORAL ARTERY;  Surgeon: Sherren Kerns, MD;  Location: [facility] OR;  Service: Vascular;  Laterality: Bilateral;  . GROIN DEBRIDEMENT Left 07/06/2019   Procedure: INCISION AND DRAINAGE OF LEFT GROIN;  Surgeon: Sherren Kerns, MD;  Location: [facility] OR;  Service: Vascular;  Laterality: Left;  . growth removed from chest    . HEMORRHOID SURGERY    . HERNIA REPAIR  12 yrs ago   left inguinal hernia  . INCISE AND DRAIN ABCESS Left 07/06/2019   Incision and drainage left groin placement of VAC dressing  . INGUINAL HERNIA REPAIR  10/16/2010   Procedure: HERNIA REPAIR INGUINAL ADULT;  Surgeon: Dalia Heading;  Location: [facility] ORS;  Service: General;  Laterality: Right;  . INSERTION OF ILIAC STENT Right 06/08/2019   Procedure: INSERTION OF RIGHT COMMON ILIAC AND EXTERNAL ILICAC STENT;  Surgeon: Sherren Kerns, MD;  Location: [facility] OR;  Service: Vascular;  Laterality: Right;  . PATCH ANGIOPLASTY Right 06/08/2019   Procedure: Patch Angioplasty Right Common Femoral;  Surgeon: Sherren Kerns, MD;  Location: [facility] OR;  Service: Vascular;  Laterality: Right;    Social History   Socioeconomic History  . Marital status: Divorced    Spouse name: Not on file  . Number of children: 3  . Years of education: Not on file  . Highest education level: Not on file  Occupational History  . Not on file  Tobacco Use  . Smoking status: Former Smoker    Packs/day: 0.25    Years: 70.00  Pack years: 17.50    Types: Cigarettes    Quit date: 05/30/2019    Years since quitting: 1.0  . Smokeless tobacco: Never Used  Vaping Use  . Vaping Use: Never used  Substance and Sexual Activity  . Alcohol use: Yes    Alcohol/week: 3.0 standard drinks    Types: 3 Shots of liquor per week    Comment: 3 2oz. shots of bourbon/day  . Drug use: No  . Sexual activity: Not on file  Other Topics Concern  . Not on file  Social History Narrative  . Not on file   Social  Determinants of Health   Financial Resource Strain: Not on file  Food Insecurity: Not on file  Transportation Needs: Not on file  Physical Activity: Not on file  Stress: Not on file  Social Connections: Not on file  Intimate Partner Violence: Not on file    Family History  Problem Relation Age of Onset  . Stroke Mother   . Pseudochol deficiency Neg Hx   . Malignant hyperthermia Neg Hx   . Hypotension Neg Hx   . Anesthesia problems Neg Hx     Current Outpatient Medications  Medication Sig Dispense Refill  . Multiple Vitamins-Minerals (MULTIVITAMIN WITH MINERALS) tablet Take 1 tablet by mouth daily with lunch.    . raNITIdine HCl (ZANTAC PO) Take by mouth.    . rosuvastatin (CRESTOR) 20 MG tablet Take 1 tablet (20 mg total) by mouth daily. 90 tablet 12  . aspirin EC 81 MG tablet Take 1 tablet (81 mg total) by mouth daily. (Patient not taking: Reported on 06/09/2020) 150 tablet 10  . clopidogrel (PLAVIX) 75 MG tablet TAKE 1 TABLET BY MOUTH EVERY DAY AT 6 AM (Patient not taking: Reported on 06/09/2020) 90 tablet 1  . nabumetone (RELAFEN) 500 MG tablet Take 500 mg by mouth 2 (two) times daily. (Patient not taking: Reported on 06/09/2020)     No current facility-administered medications for this visit.    Allergies  Allergen Reactions  . Penicillins Other (See Comments)    UNSPECIFIED REACTION  Did it involve swelling of the face/tongue/throat, SOB, or low BP? Unknown Did it involve sudden or severe rash/hives, skin peeling, or any reaction on the inside of your mouth or nose? Unknown Did you need to seek medical attention at a hospital or doctor's office? Unknown When did it last happen?Between ages 67-13 If all above answers are "NO", may proceed with cephalosporin use.      REVIEW OF SYSTEMS:   [X]  denotes positive finding, [ ]  denotes negative finding Cardiac  Comments:  Chest pain or chest pressure:    Shortness of breath upon exertion:    Short of breath when lying  flat:    Irregular heart rhythm:        Vascular    Pain in calf, thigh, or hip brought on by ambulation:    Pain in feet at night that wakes you up from your sleep:     Blood clot in your veins:    Leg swelling:         Pulmonary    Oxygen at home:    Productive cough:     Wheezing:         Neurologic    Sudden weakness in arms or legs:     Sudden numbness in arms or legs:     Sudden onset of difficulty speaking or slurred speech:    Temporary loss of vision in one eye:  Problems with dizziness:         Gastrointestinal    Blood in stool:     Vomited blood:         Genitourinary    Burning when urinating:     Blood in urine:        Psychiatric    Major depression:         Hematologic    Bleeding problems:    Problems with blood clotting too easily:        Skin    Rashes or ulcers:        Constitutional    Fever or chills:      PHYSICAL EXAMINATION:  Vitals:   06/09/20 0949  BP: (!) 145/70  Pulse: (!) 56  Resp: 20  Temp: 97.6 F (36.4 C)  TempSrc: Temporal  SpO2: 98%  Weight: 169 lb 1.6 oz (76.7 kg)  Height: 5\' 9"  (1.753 m)    General:  WDWN in NAD; vital signs documented above Gait: Not observed HENT: WNL, normocephalic Pulmonary: normal non-labored breathing Cardiac: regular HR Abdomen: soft, NT, no masses Skin: without rashes Vascular Exam/Pulses:  Right Left  Radial 2+ (normal) 2+ (normal)  DP absent absent  PT absent absent  Easily palpable femoral to femoral bypass pulse Extremities: without ischemic changes, without Gangrene , without cellulitis; without open wounds;  Musculoskeletal: no muscle wasting or atrophy  Neurologic: A&O X 3;  No focal weakness or paresthesias are detected Psychiatric:  The pt has Normal affect.   Non-Invasive Vascular Imaging:   ABI/TBIToday's ABIToday's TBIPrevious ABIPrevious TBI  +-------+-----------+-----------+------------+------------+  Right 0.73    0.29    0.85    0.71       +-------+-----------+-----------+------------+------------+  Left  0.94    0.44    0.82    0.43       AAA duplex shows 4.6 cm at largest diameter    ASSESSMENT/PLAN:: 87 y.o. male here for surveillance of AAA and PAD  ABIs are unchanged over the past 6 months showing right ABI of 0.73 and left ABI of 0.94 Bilateral lower extremities well perfused and patient has a easily palpable femoral to femoral bypass graft pulse No indication for further revascularization despite mild calf claudication symptoms Also strongly encourage patient to resume at least 81 mg of aspirin daily  AAA measuring 4.6 cm at maximal diameter today based on duplex Plan will be to recheck AAA duplex and ABIs in 6 months Patient will call/return office sooner with any problems or concerns   83, PA-C [facility] [phone]  Clinic MD:   [phone]

## 2020-06-14 ENCOUNTER — Other Ambulatory Visit: Payer: Self-pay

## 2020-06-14 DIAGNOSIS — I739 Peripheral vascular disease, unspecified: Secondary | ICD-10-CM

## 2020-06-14 DIAGNOSIS — I714 Abdominal aortic aneurysm, without rupture, unspecified: Secondary | ICD-10-CM

## 2020-07-28 DIAGNOSIS — B070 Plantar wart: Secondary | ICD-10-CM | POA: Diagnosis not present

## 2020-08-15 DIAGNOSIS — L570 Actinic keratosis: Secondary | ICD-10-CM | POA: Diagnosis not present

## 2020-08-15 DIAGNOSIS — X32XXXD Exposure to sunlight, subsequent encounter: Secondary | ICD-10-CM | POA: Diagnosis not present

## 2020-08-15 DIAGNOSIS — B070 Plantar wart: Secondary | ICD-10-CM | POA: Diagnosis not present

## 2020-09-14 DIAGNOSIS — X32XXXD Exposure to sunlight, subsequent encounter: Secondary | ICD-10-CM | POA: Diagnosis not present

## 2020-09-14 DIAGNOSIS — B070 Plantar wart: Secondary | ICD-10-CM | POA: Diagnosis not present

## 2020-09-14 DIAGNOSIS — L570 Actinic keratosis: Secondary | ICD-10-CM | POA: Diagnosis not present

## 2020-09-21 DIAGNOSIS — H43813 Vitreous degeneration, bilateral: Secondary | ICD-10-CM | POA: Diagnosis not present

## 2020-09-21 DIAGNOSIS — H25813 Combined forms of age-related cataract, bilateral: Secondary | ICD-10-CM | POA: Diagnosis not present

## 2020-09-21 DIAGNOSIS — H353131 Nonexudative age-related macular degeneration, bilateral, early dry stage: Secondary | ICD-10-CM | POA: Diagnosis not present

## 2020-09-21 DIAGNOSIS — H16223 Keratoconjunctivitis sicca, not specified as Sjogren's, bilateral: Secondary | ICD-10-CM | POA: Diagnosis not present

## 2020-09-23 DIAGNOSIS — H353131 Nonexudative age-related macular degeneration, bilateral, early dry stage: Secondary | ICD-10-CM | POA: Diagnosis not present

## 2020-09-23 DIAGNOSIS — Z01818 Encounter for other preprocedural examination: Secondary | ICD-10-CM | POA: Diagnosis not present

## 2020-09-23 DIAGNOSIS — H25811 Combined forms of age-related cataract, right eye: Secondary | ICD-10-CM | POA: Diagnosis not present

## 2020-09-23 DIAGNOSIS — H25812 Combined forms of age-related cataract, left eye: Secondary | ICD-10-CM | POA: Diagnosis not present

## 2020-10-03 DIAGNOSIS — H25811 Combined forms of age-related cataract, right eye: Secondary | ICD-10-CM | POA: Diagnosis not present

## 2020-10-03 DIAGNOSIS — H2511 Age-related nuclear cataract, right eye: Secondary | ICD-10-CM | POA: Diagnosis not present

## 2020-10-04 ENCOUNTER — Telehealth: Payer: Self-pay | Admitting: Cardiovascular Disease

## 2020-10-04 NOTE — Telephone Encounter (Signed)
   Patient Name: Reynald Woods  DOB: 20-Sep-1932 MRN: [id number]  Primary Cardiologist: Reatha Harps, MD  Chart reviewed as part of pre-operative protocol coverage. Cataract extractions are recognized in guidelines as low risk surgeries that do not typically require specific preoperative testing or holding of blood thinner therapy. Therefore, given past medical history and time since last visit, based on ACC/AHA guidelines, [place]ice [place] would be at acceptable risk for the planned procedure without further cardiovascular testing.   I will route this recommendation to the requesting party via Epic fax function and remove from pre-op pool.  Please call with questions.  Ronney Asters, NP 10/04/2020, 8:53 AM

## 2020-10-04 NOTE — Telephone Encounter (Signed)
   [facility] Pre-operative Risk Assessment    Patient Name: Edward Campos  DOB: Nov 03, 1932 MRN: [id number]  HEARTCARE STAFF:  - IMPORTANT!!!!!! Under Visit Info/Reason for Call, type in Other and utilize the format Clearance MM/DD/YY or Clearance TBD. Do not use dashes or single digits. - Please review there is not already an duplicate clearance open for this procedure. - If request is for dental extraction, please clarify the # of teeth to be extracted. - If the patient is currently at the dentist's office, call Pre-Op Callback Staff (MA/nurse) to input urgent request.  - If the patient is not currently in the dentist office, please route to the Pre-Op pool.  Request for surgical clearance:  What type of surgery is being performed? Cataract surgery  When is this surgery scheduled? 10/14/2020  What type of clearance is required (medical clearance vs. Pharmacy clearance to hold med vs. Both)? Medical   Are there any medications that need to be held prior to surgery and how long? No   Practice name and name of physician performing surgery? [place]; Dr. Manuella Campos  What is the office phone number? [phone]   7.   What is the office fax number? [phone]  8.   Anesthesia type (None, local, MAC, general) ? IV Sedation  Patient had 1st cataract surgery on one eye and was told he needs medical clearance by anethesia for [name] 10/04/2020, 8:43 AM  _________________________________________________________________   (provider comments below)

## 2020-10-09 NOTE — Progress Notes (Signed)
Cardiology Office Note:   Date:  10/11/2020  NAME:  Edward Campos    MRN: [id number] DOB:  1932-08-22   PCP:  Patient, No Pcp Per (Inactive)  Cardiologist:  Reatha Harps, MD  Electrophysiologist:  None   Referring MD: Elwin Mocha*   Chief Complaint  Patient presents with   Atrial Flutter   History of Present Illness:   Edward Campos is a 87 y.o. male with a hx of PAD, AAA, HLD who presents for follow-up. Seen in February of 2021 for preop prior to high risk vascular surgery. Normal echo and normal stress test.  He reports he had cataract surgery last week.  Heart rate was noted to be elevated.  EKG demonstrates he is in atrial flutter with 2-1 block.  He denies any chest pain or trouble breathing.  He was seen last year and underwent high risk vascular surgery.  Did well with this.  Echocardiogram and stress test were normal.  Despite being in atrial flutter he has no issues.  He will undergo cataract surgery on the other eye this week.  Only medication he is currently taking is Crestor.  He reports he would not like to delay his surgery.  I suspect he can likely go to cataract surgery with rate controlled atrial flutter.  The question is can he be anticoagulated.  He ultimately will need a TEE/cardioversion.  However I think he could go to cataract surgery and then come back for further evaluation.  Problem List 1. PAD -L fem-fem bypass with iliofemoral endarterectomies 06/08/2019 -R common iliac/external iliac stent 06/08/2019 2. AAA -4.7 cm 06/09/2020 3. HLD -T chol 143, HDL 48, LDL 76, TG 101 4. Atrial flutter, typical 10/11/2020 -CHADSVASC=3 (age, PAD)  Past Medical History: Past Medical History:  Diagnosis Date   AAA (abdominal aortic aneurysm) (HCC)    Anxiety    Arthritis    BPH (benign prostatic hyperplasia)    Dizziness    Hyperlipidemia     Past Surgical History: Past Surgical History:  Procedure Laterality Date   ABDOMINAL AORTOGRAM W/LOWER EXTREMITY  N/A 05/06/2019   Procedure: ABDOMINAL AORTOGRAM W/LOWER EXTREMITY;  Surgeon: Cephus Shelling, MD;  Location: [facility] INVASIVE CV LAB;  Service: Cardiovascular;  Laterality: N/A;   APPLICATION OF WOUND VAC  07/06/2019   Procedure: Application Of Wound Vac Left Groin;  Surgeon: Sherren Kerns, MD;  Location: [facility] OR;  Service: Vascular;;   ENDARTERECTOMY FEMORAL Bilateral 06/08/2019   Procedure: ENDARTERECTOMY RIGHT COMMON FEMORAL; ENDARTERECTOMY RIGHT EXTERNAL ILIAC; ENDARTERECTOMY LEFT COMMON FEMORAL; ENDARTERECTOMY LEFT EXTERNAL ILIAC;  Surgeon: Sherren Kerns, MD;  Location: [facility] OR;  Service: Vascular;  Laterality: Bilateral;   FEMORAL-FEMORAL BYPASS GRAFT Bilateral 06/08/2019   Procedure: BYPASS GRAFT FEMORAL-FEMORAL ARTERY;  Surgeon: Sherren Kerns, MD;  Location: [facility] OR;  Service: Vascular;  Laterality: Bilateral;   GROIN DEBRIDEMENT Left 07/06/2019   Procedure: INCISION AND DRAINAGE OF LEFT GROIN;  Surgeon: Sherren Kerns, MD;  Location: [facility] OR;  Service: Vascular;  Laterality: Left;   growth removed from chest     HEMORRHOID SURGERY     HERNIA REPAIR  12 yrs ago   left inguinal hernia   INCISE AND DRAIN ABCESS Left 07/06/2019   Incision and drainage left groin placement of VAC dressing   INGUINAL HERNIA REPAIR  10/16/2010   Procedure: HERNIA REPAIR INGUINAL ADULT;  Surgeon: Dalia Heading;  Location: [facility] ORS;  Service: General;  Laterality: Right;   INSERTION OF ILIAC STENT Right 06/08/2019  Procedure: INSERTION OF RIGHT COMMON ILIAC AND EXTERNAL ILICAC STENT;  Surgeon: Sherren Kerns, MD;  Location: [facility] OR;  Service: Vascular;  Laterality: Right;   PATCH ANGIOPLASTY Right 06/08/2019   Procedure: Patch Angioplasty Right Common Femoral;  Surgeon: Sherren Kerns, MD;  Location: [facility] OR;  Service: Vascular;  Laterality: Right;    Current Medications: Current Meds  Medication Sig   apixaban (ELIQUIS) 5 MG TABS tablet Take 1 tablet (5 mg total) by mouth 2 (two) times daily.    metoprolol tartrate (LOPRESSOR) 25 MG tablet Take 1 tablet (25 mg total) by mouth 2 (two) times daily.   Multiple Vitamins-Minerals (MULTIVITAMIN WITH MINERALS) tablet Take 1 tablet by mouth daily with lunch.   ofloxacin (OCUFLOX) 0.3 % ophthalmic solution Place into the right eye.   prednisoLONE acetate (PRED FORTE) 1 % ophthalmic suspension SMARTSIG:In Eye(s)   rosuvastatin (CRESTOR) 20 MG tablet Take 1 tablet (20 mg total) by mouth daily.   Simethicone (GAS RELIEF EXTRA STRENGTH PO) Take by mouth as needed.     Allergies:    Penicillins   Social History: Social History   Socioeconomic History   Marital status: Divorced    Spouse name: Not on file   Number of children: 3   Years of education: Not on file   Highest education level: Not on file  Occupational History   Not on file  Tobacco Use   Smoking status: Former    Packs/day: 0.25    Years: 70.00    Pack years: 17.50    Types: Cigarettes    Quit date: 05/30/2019    Years since quitting: 1.3   Smokeless tobacco: Never  Vaping Use   Vaping Use: Never used  Substance and Sexual Activity   Alcohol use: Yes    Alcohol/week: 3.0 standard drinks    Types: 3 Shots of liquor per week    Comment: 3 2oz. shots of bourbon/day   Drug use: No   Sexual activity: Not on file  Other Topics Concern   Not on file  Social History Narrative   Not on file   Social Determinants of Health   Financial Resource Strain: Not on file  Food Insecurity: Not on file  Transportation Needs: Not on file  Physical Activity: Not on file  Stress: Not on file  Social Connections: Not on file     Family History: The patient's family history includes Stroke in his mother. There is no history of Pseudochol deficiency, Malignant hyperthermia, Hypotension, or Anesthesia problems.  ROS:   All other ROS reviewed and negative. Pertinent positives noted in the HPI.     EKGs/Labs/Other Studies Reviewed:   The following studies were personally  reviewed by me today:  EKG:  EKG is ordered today.  The ekg ordered today demonstrates atrial flutter with 2:1 block heart rate 123, and was personally reviewed by me.   NM Stress 06/05/2019  The left ventricular ejection fraction is normal (55-65%). Nuclear stress EF: 55%. There was no ST segment deviation noted during stress. Defect 1: There is a small defect of mild severity present in the apex location. The study is normal. This is a low risk study.   Normal stress nuclear study with mild apical thinning but no ischemia.  Gated ejection fraction 55% with normal wall motion.  TTE 06/02/2019   1. Left ventricular ejection fraction, by estimation, is 70 to 75%. The  left ventricle has hyperdynamic function. The left ventricle has no  regional wall motion  abnormalities. There is mild left ventricular  hypertrophy. Left ventricular diastolic  parameters are consistent with Grade I diastolic dysfunction (impaired  relaxation).   2. Right ventricular systolic function is normal. The right ventricular  size is normal.   3. The mitral valve is normal in structure. No evidence of mitral valve  regurgitation. No evidence of mitral stenosis.   4. The aortic valve was not well visualized. Aortic valve regurgitation  is not visualized. No aortic stenosis is present.   5. The inferior vena cava is normal in size with greater than 50%  respiratory variability, suggesting right atrial pressure of 3 mmHg.   Recent Labs: No results found for requested labs within last 8760 hours.   Recent Lipid Panel    Component Value Date/Time   CHOL 143 05/29/2019 1450   TRIG 101 05/29/2019 1450   HDL 48 05/29/2019 1450   CHOLHDL 3.0 05/29/2019 1450   LDLCALC 76 05/29/2019 1450    Physical Exam:   VS:  BP 136/90   Pulse (!) 123   Resp 18   Ht 5\' 10"  (1.778 m)   Wt 171 lb (77.6 kg)   SpO2 98%   BMI 24.54 kg/m    Wt Readings from Last 3 Encounters:  10/11/20 171 lb (77.6 kg)  06/09/20 169 lb  1.6 oz (76.7 kg)  12/08/19 165 lb 9.6 oz (75.1 kg)    General: Well nourished, well developed, in no acute distress Head: Atraumatic, normal size  Eyes: PEERLA, EOMI  Neck: Supple, no JVD Endocrine: No thryomegaly Cardiac: Normal S1, S2; tachycardia noted, no murmurs Lungs: Clear to auscultation bilaterally, no wheezing, rhonchi or rales  Abd: Soft, nontender, no hepatomegaly  Ext: No edema, pulses 2+ Musculoskeletal: No deformities, BUE and BLE strength normal and equal Skin: Warm and dry, no rashes   Neuro: Alert and oriented to person, place, time, and situation, CNII-XII grossly intact, no focal deficits  Psych: Normal mood and affect   ASSESSMENT:   Edward Campos is a 87 y.o. male who presents for the following: 1. Typical atrial flutter (HCC)   2. Preoperative cardiovascular examination   3. PAD (peripheral artery disease) (HCC)   4. AAA (abdominal aortic aneurysm) without rupture (HCC)   5. Mixed hyperlipidemia     PLAN:   1. Typical atrial flutter (HCC) -EKG demonstrates typical atrial flutter with 2: 1 block.  No symptoms from this.  Echocardiogram and stress test normal last year. -He will have cataract surgery on Friday.  He is already undergone cataract surgery in 1 eye.  Given his lack of symptoms I see no need to delay surgery.  I did discuss his case with his ophthalmologist.  We will start him on metoprolol tartrate 25 twice a day.  We will also start Eliquis 5 mg twice daily.  He can have cataract surgery on Eliquis without any issues.  I will then see him back the following week and we will discuss cardioversion.  Would like to see that he gets through surgery well.  2. Preoperative cardiovascular examination -EKG shows he is in atrial flutter.  No symptoms from this.  Has tolerated 1 eye with cataract surgery.  He can proceed with his next eye this Friday.  We will then see him back next week to address his atrial flutter.  He has had a normal stress test and  normal echocardiogram in the past.  Underwent high risk vascular surgery last year.  No cardiac complaints.  3. PAD (peripheral artery  disease) (HCC) -Status post left femoral artery bypass surgery.  Underwent right intervention as well.  Denies any claudication symptoms.  Followed by vascular. -On Crestor.  We will discuss restarting aspirin at his next visit.  4. AAA (abdominal aortic aneurysm) without rupture [facility]) -Not meeting surgical threshold.  Followed by vascular surgery.  5. Mixed hyperlipidemia -He will remain on statin therapy.  He is not on aspirin or Plavix but likely should get back on aspirin.  We will discuss this at his next visit.  Disposition: Return in about 1 week (around 10/18/2020).  Medication Adjustments/Labs and Tests Ordered: Current medicines are reviewed at length with the patient today.  Concerns regarding medicines are outlined above.  Orders Placed This Encounter  Procedures   EKG 12-Lead    Meds ordered this encounter  Medications   metoprolol tartrate (LOPRESSOR) 25 MG tablet    Sig: Take 1 tablet (25 mg total) by mouth 2 (two) times daily.    Dispense:  180 tablet    Refill:  3   apixaban (ELIQUIS) 5 MG TABS tablet    Sig: Take 1 tablet (5 mg total) by mouth 2 (two) times daily.    Dispense:  60 tablet    Refill:  6     Patient Instructions  Medication Instructions:   START METOPROLOL TARTRATE 25 MG ONE TABLET TWICE DAILY  START ELIQUIS 5 MG ONE TABLET TWICE DAILY  *If you need a refill on your cardiac medications before your next appointment, please call your pharmacy*  Follow-Up: At [facility], you and your health needs are our priority.  As part of our continuing mission to provide you with exceptional heart care, we have created designated Provider Care Teams.  These Care Teams include your primary Cardiologist (physician) and Advanced Practice Providers (APPs -  Physician Assistants and Nurse Practitioners) who all work together  to provide you with the care you need, when you need it.  We recommend signing up for the patient portal called "MyChart".  Sign up information is provided on this After Visit Summary.  MyChart is used to connect with patients for Virtual Visits (Telemedicine).  Patients are able to view lab/test results, encounter notes, upcoming appointments, etc.  Non-urgent messages can be sent to your provider as well.   To learn more about what you can do with MyChart, go to [email or web address].    Your next appointment:   1 week(s)  The format for your next appointment:   In Person  Provider:   Lennie Odor, MD     Time Spent with Patient: I have spent a total of 35 minutes with patient reviewing hospital notes, telemetry, EKGs, labs and examining the patient as well as establishing an assessment and plan that was discussed with the patient.  > 50% of time was spent in direct patient care.  Signed, Lenna Gilford. Flora Lipps, MD, [facility]  [facility]  [address] [place], [place] [address] [phone]  10/11/2020 11:49 AM

## 2020-10-11 ENCOUNTER — Ambulatory Visit (INDEPENDENT_AMBULATORY_CARE_PROVIDER_SITE_OTHER): Payer: Medicare Other | Admitting: Cardiovascular Disease

## 2020-10-11 ENCOUNTER — Encounter: Payer: Self-pay | Admitting: Cardiovascular Disease

## 2020-10-11 ENCOUNTER — Other Ambulatory Visit: Payer: Self-pay

## 2020-10-11 VITALS — BP 136/90 | HR 123 | Resp 18 | Ht 70.0 in | Wt 171.0 lb

## 2020-10-11 DIAGNOSIS — Z01810 Encounter for preprocedural cardiovascular examination: Secondary | ICD-10-CM

## 2020-10-11 DIAGNOSIS — I483 Typical atrial flutter: Secondary | ICD-10-CM

## 2020-10-11 DIAGNOSIS — I739 Peripheral vascular disease, unspecified: Secondary | ICD-10-CM

## 2020-10-11 DIAGNOSIS — I714 Abdominal aortic aneurysm, without rupture, unspecified: Secondary | ICD-10-CM

## 2020-10-11 DIAGNOSIS — E782 Mixed hyperlipidemia: Secondary | ICD-10-CM | POA: Diagnosis not present

## 2020-10-11 MED ORDER — APIXABAN 5 MG PO TABS: 5.0000 mg | ORAL_TABLET | Freq: Two times a day (BID) | ORAL | 6 refills | Status: DC

## 2020-10-11 MED ORDER — METOPROLOL TARTRATE 25 MG PO TABS: 25.0000 mg | ORAL_TABLET | Freq: Two times a day (BID) | ORAL | 3 refills | Status: DC

## 2020-10-11 NOTE — Patient Instructions (Signed)
Medication Instructions:   START METOPROLOL TARTRATE 25 MG ONE TABLET TWICE DAILY  START ELIQUIS 5 MG ONE TABLET TWICE DAILY  *If you need a refill on your cardiac medications before your next appointment, please call your pharmacy*  Follow-Up: At [facility], you and your health needs are our priority.  As part of our continuing mission to provide you with exceptional heart care, we have created designated Provider Care Teams.  These Care Teams include your primary Cardiologist (physician) and Advanced Practice Providers (APPs -  Physician Assistants and Nurse Practitioners) who all work together to provide you with the care you need, when you need it.  We recommend signing up for the patient portal called "MyChart".  Sign up information is provided on this After Visit Summary.  MyChart is used to connect with patients for Virtual Visits (Telemedicine).  Patients are able to view lab/test results, encounter notes, upcoming appointments, etc.  Non-urgent messages can be sent to your provider as well.   To learn more about what you can do with MyChart, go to [email or web address].    Your next appointment:   1 week(s)  The format for your next appointment:   In Person  Provider:   Lennie Odor, MD

## 2020-10-14 DIAGNOSIS — H25812 Combined forms of age-related cataract, left eye: Secondary | ICD-10-CM | POA: Diagnosis not present

## 2020-10-14 DIAGNOSIS — H2512 Age-related nuclear cataract, left eye: Secondary | ICD-10-CM | POA: Diagnosis not present

## 2020-10-17 NOTE — Progress Notes (Signed)
Cardiology Office Note:   Date:  10/18/2020  NAME:  Edward Campos    MRN: [id number] DOB:  06/11/1932   PCP:  Patient, No Pcp Per (Inactive)  Cardiologist:  Reatha HarpsWesley T O'Neal, MD  Electrophysiologist:  None   Referring MD: No ref. provider found   Chief Complaint  Patient presents with   Atrial Flutter    History of Present Illness:   Edward Campos is a 87 y.o. male with a hx of Aflutter, PAD, HLD who presents for follow-up. Seen last week and found to be in AFL after one cataract surgery. We recommend rate control and to complete the other eye. He presents today to discuss DCCV. EKG shows he is still in atrial flutter.  Rate is controlled at 100 bpm.  He is on Eliquis.  No missed doses in the last week.  We discussed TEE/DCCV versus DCCV.  He would like to forego any TEE and just pursuing DCCV.  Earliest will be 11/03/2020.  No chest pain or trouble breathing.  Denies any symptoms.  No missed doses of Eliquis.  He still does not have a primary care physician.  Lab work from last year was all normal.  He needs repeat lab work.  No bleeding on Eliquis.  Cataract surgery is complete.  He does need a repeat echocardiogram as well.  Would plan to do this in [place].  No symptoms from his atrial flutter.  I did counsel him that in atrial flutter will not resolve.   Problem List 1. PAD -L fem-fem bypass with iliofemoral endarterectomies 06/08/2019 -R common iliac/external iliac stent 06/08/2019 2. AAA -4.7 cm 06/09/2020 3. HLD -T chol 143, HDL 48, LDL 76, TG 101 4. Atrial flutter, typical 10/11/2020 -CHADSVASC=3 (age, PAD)  Past Medical History: Past Medical History:  Diagnosis Date   AAA (abdominal aortic aneurysm) (HCC)    Anxiety    Arthritis    BPH (benign prostatic hyperplasia)    Dizziness    Hyperlipidemia     Past Surgical History: Past Surgical History:  Procedure Laterality Date   ABDOMINAL AORTOGRAM W/LOWER EXTREMITY N/A 05/06/2019   Procedure: ABDOMINAL AORTOGRAM W/LOWER  EXTREMITY;  Surgeon: Cephus Shellinglark, Christopher J, MD;  Location: [facility];  Service: Cardiovascular;  Laterality: N/A;   APPLICATION OF WOUND VAC  07/06/2019   Procedure: Application Of Wound Vac Left Groin;  Surgeon: Sherren KernsFields, Charles E, MD;  [facility];  Service: Vascular;;   ENDARTERECTOMY FEMORAL Bilateral 06/08/2019   Procedure: ENDARTERECTOMY RIGHT COMMON FEMORAL; ENDARTERECTOMY RIGHT EXTERNAL ILIAC; ENDARTERECTOMY LEFT COMMON FEMORAL; ENDARTERECTOMY LEFT EXTERNAL ILIAC;  Surgeon: Sherren KernsFields, Charles E, MD;  Location: [facility] OR;  Service: Vascular;  Laterality: Bilateral;   FEMORAL-FEMORAL BYPASS GRAFT Bilateral 06/08/2019   Procedure: BYPASS GRAFT FEMORAL-FEMORAL ARTERY;  Surgeon: Sherren KernsFields, Charles E, MD;  [facility];  Service: Vascular;  Laterality: Bilateral;   GROIN DEBRIDEMENT Left 07/06/2019   Procedure: INCISION AND DRAINAGE OF LEFT GROIN;  Surgeon: Sherren KernsFields, Charles E, MD;  Location: [facility] OR;  Service: Vascular;  Laterality: Left;   growth removed from chest     HEMORRHOID SURGERY     HERNIA REPAIR  12 yrs ago   left inguinal hernia   INCISE AND DRAIN ABCESS Left 07/06/2019   Incision and drainage left groin placement of VAC dressing   INGUINAL HERNIA REPAIR  10/16/2010   Procedure: HERNIA REPAIR INGUINAL ADULT;  Surgeon: Dalia HeadingMark A Jenkins;  Location: [facility]S;  Service: General;  Laterality: Right;   INSERTION OF ILIAC STENT  Right 06/08/2019   Procedure: INSERTION OF RIGHT COMMON ILIAC AND EXTERNAL ILICAC STENT;  Surgeon: Fields, Charles E, MD;  Location: [facility] OR;  Service: Vascular;  Laterality: Right;   PATCH ANGIOPLASTY Right 06/08/2019   Procedure: Patch Angioplasty Right Common Femoral;  Surgeon: Fields, Charles E, MD;  Location: [facility] OR;  Service: Vascular;  Laterality: Right;    Current Medications: Current Meds  Medication Sig   apixaban (ELIQUIS) 5 MG TABS tablet Take 1 tablet (5 mg total) by mouth 2 (two) times daily.   metoprolol tartrate (LOPRESSOR) 25 MG tablet Take 1 tablet (25 mg  total) by mouth 2 (two) times daily.   Multiple Vitamins-Minerals (MULTIVITAMIN WITH MINERALS) tablet Take 1 tablet by mouth daily with lunch.   ofloxacin (OCUFLOX) 0.3 % ophthalmic solution Place into the right eye.   prednisoLONE acetate (PRED FORTE) 1 % ophthalmic suspension SMARTSIG:In Eye(s)   rosuvastatin (CRESTOR) 20 MG tablet Take 1 tablet (20 mg total) by mouth daily.   Simethicone (GAS RELIEF EXTRA STRENGTH PO) Take by mouth as needed.     Allergies:    Penicillins   Social History: Social History   Socioeconomic History   Marital status: Divorced    Spouse name: Not on file   Number of children: 3   Years of education: Not on file   Highest education level: Not on file  Occupational History   Not on file  Tobacco Use   Smoking status: Former    Packs/day: 0.25    Years: 70.00    Pack years: 17.50    Types: Cigarettes    Quit date: 05/30/2019    Years since quitting: 1.3   Smokeless tobacco: Never  Vaping Use   Vaping Use: Never used  Substance and Sexual Activity   Alcohol use: Yes    Alcohol/week: 3.0 standard drinks    Types: 3 Shots of liquor per week    Comment: 3 2oz. shots of bourbon/day   Drug use: No   Sexual activity: Not on file  Other Topics Concern   Not on file  Social History Narrative   Not on file   Social Determinants of Health   Financial Resource Strain: Not on file  Food Insecurity: Not on file  Transportation Needs: Not on file  Physical Activity: Not on file  Stress: Not on file  Social Connections: Not on file     Family History: The patient's family history includes Stroke in his mother. There is no history of Pseudochol deficiency, Malignant hyperthermia, Hypotension, or Anesthesia problems.  ROS:   All other ROS reviewed and negative. Pertinent positives noted in the HPI.     EKGs/Labs/Other Studies Reviewed:   The following studies were personally reviewed by me today:  EKG:  EKG is ordered today.  The ekg ordered  today demonstrates atrial flutter heart rate 100, no acute ischemic changes or evidence of infarction, and was personally reviewed by me.   NM Stress 06/05/2019 The left ventricular ejection fraction is normal (55-65%). Nuclear stress EF: 55%. There was no ST segment deviation noted during stress. Defect 1: There is a small defect of mild severity present in the apex location. The study is normal. This is a low risk study.   Normal stress nuclear study with mild apical thinning but no ischemia.  Gated ejection fraction 55% with normal wall motion.  TTE 06/02/2019  1. Left ventricular ejection fraction, by estimation, is 70 to 75%. The  left ventricle has hyperdynamic function. The left   ventricle has no  regional wall motion abnormalities. There is mild left ventricular  hypertrophy. Left ventricular diastolic  parameters are consistent with Grade I diastolic dysfunction (impaired  relaxation).   2. Right ventricular systolic function is normal. The right ventricular  size is normal.   3. The mitral valve is normal in structure. No evidence of mitral valve  regurgitation. No evidence of mitral stenosis.   4. The aortic valve was not well visualized. Aortic valve regurgitation  is not visualized. No aortic stenosis is present.   5. The inferior vena cava is normal in size with greater than 50%  respiratory variability, suggesting right atrial pressure of 3 mmHg.  Recent Labs: No results found for requested labs within last 8760 hours.   Recent Lipid Panel    Component Value Date/Time   CHOL 143 05/29/2019 1450   TRIG 101 05/29/2019 1450   HDL 48 05/29/2019 1450   CHOLHDL 3.0 05/29/2019 1450   LDLCALC 76 05/29/2019 1450    Physical Exam:   VS:  BP (!) 137/91 (BP Location: Left Arm, Patient Position: Sitting, Cuff Size: Normal)   Pulse (!) 114   Ht 5\' 9"  (1.753 m)   Wt 173 lb (78.5 kg)   SpO2 99%   BMI 25.55 kg/m    Wt Readings from Last 3 Encounters:  10/18/20 173 lb  (78.5 kg)  10/11/20 171 lb (77.6 kg)  06/09/20 169 lb 1.6 oz (76.7 kg)    General: Well nourished, well developed, in no acute distress Head: Atraumatic, normal size  Eyes: PEERLA, EOMI  Neck: Supple, no JVD Endocrine: No thryomegaly Cardiac: Normal S1, S2; irregular rhythm, no murmurs Lungs: Clear to auscultation bilaterally, no wheezing, rhonchi or rales  Abd: Soft, nontender, no hepatomegaly  Ext: No edema, pulses 2+ Musculoskeletal: No deformities, BUE and BLE strength normal and equal Skin: Warm and dry, no rashes   Neuro: Alert and oriented to person, place, time, and situation, CNII-XII grossly intact, no focal deficits  Psych: Normal mood and affect   ASSESSMENT:   Edward Campos is a 87 y.o. male who presents for the following: 1. Typical atrial flutter (HCC)   2. PAD (peripheral artery disease) (HCC)   3. AAA (abdominal aortic aneurysm) without rupture (HCC)   4. Mixed hyperlipidemia     PLAN:   1. Typical atrial flutter (HCC) -Remains in atrial flutter.  CHA2DS2-VASc score 3.  On Eliquis.  No missed doses in the last 1 week.  We will set him up for a DCCV on 11/03/2020.  He will give 11/05/2020 lab work 1 week before.  This will include a BMP, CBC and TSH.  Lab work last year was normal.  He also needs a repeat echocardiogram which we will obtain at [facility].  He really denies any symptoms from this but I fear his atrial flutter will not resolve.  Hopefully cardioversion will do the trick.  I suspect the trigger was cataract surgery.  He will then see me back roughly 2 weeks after his cardioversion we will likely pursue a monitor.  2. PAD (peripheral artery disease) (HCC) 3. AAA (abdominal aortic aneurysm) without rupture (HCC) 4. Mixed hyperlipidemia -Extensive peripheral vascular surgery last year.  Doing well.  He has a AAA of 4.7 cm which is being followed by vascular surgery.  He will need yearly ultrasound on this.  He is on Lipitor.  Most recent LDL 76.  We will repeat a  fasting lipid when he does lab work next  week.  He has no symptoms of angina.  Underwent a stress test that was normal.  Echocardiogram was also normal last year.  Shared Decision Making/Informed Consent The risks (stroke, cardiac arrhythmias rarely resulting in the need for a temporary or permanent pacemaker, skin irritation or burns and complications associated with conscious sedation including aspiration, arrhythmia, respiratory failure and death), benefits (restoration of normal sinus rhythm) and alternatives of a direct current cardioversion were explained in detail to Mr. Kawai and he agrees to proceed.   Disposition: Return in about 1 month (around 11/18/2020).  Medication Adjustments/Labs and Tests Ordered: Current medicines are reviewed at length with the patient today.  Concerns regarding medicines are outlined above.  Orders Placed This Encounter  Procedures   CBC   TSH   Basic metabolic panel   Lipid panel   EKG 12-Lead   ECHOCARDIOGRAM COMPLETE    No orders of the defined types were placed in this encounter.   Patient Instructions  Medication Instructions:  The current medical regimen is effective;  continue present plan and medications.  *If you need a refill on your cardiac medications before your next appointment, please call your pharmacy*   Lab Work: BMET, LIPID, TSH, CBC- 1 week before in [place]. You may go to any [facility] to have these completed. (Fasting, nothing to eat or drink)   If you have labs (blood work) drawn today and your tests are completely normal, you will receive your results only by: MyChart Message (if you have MyChart) OR A paper copy in the mail If you have any lab test that is abnormal or we need to change your treatment, we will call you to review the results.   Testing/Procedures:  Echocardiogram ([place]) - Your physician has requested that you have an echocardiogram. Echocardiography is a painless test that uses sound waves to  create images of your heart. It provides your doctor with information about the size and shape of your heart and how well your heart's chambers and valves are working. This procedure takes approximately one hour. There are no restrictions for this procedure.   Your physician has requested that you have a Cardioversion. Electrical Cardioversion uses a jolt of electricity to your heart either through paddles or wired patches attached to your chest. This is a controlled, usually prescheduled, procedure. This procedure is done at the hospital and you are not awake during the procedure. You usually go home the day of the procedure. Please see the instruction sheet given to you today for more information.    Follow-Up: At [facility], you and your health needs are our priority.  As part of our continuing mission to provide you with exceptional heart care, we have created designated Provider Care Teams.  These Care Teams include your primary Cardiologist (physician) and Advanced Practice Providers (APPs -  Physician Assistants and Nurse Practitioners) who all work together to provide you with the care you need, when you need it.  We recommend signing up for the patient portal called "MyChart".  Sign up information is provided on this After Visit Summary.  MyChart is used to connect with patients for Virtual Visits (Telemedicine).  Patients are able to view lab/test results, encounter notes, upcoming appointments, etc.  Non-urgent messages can be sent to your provider as well.   To learn more about what you can do with MyChart, go to [email or web address].    Your next appointment:   September 6th at 10:00 AM  The format for your next appointment:  In Person  Provider:   Lennie Odor, MD   Other Instructions You are scheduled for a Cardioversion on August 25th with Dr. Royann Shivers.  Please arrive at the [facility] (Main Entrance A) at [facility]: [address] [place], [place] [address]  at 9 am. (1 hour prior to procedure unless lab work is needed; if lab work is needed arrive 1.5 hours ahead)  DIET: Nothing to eat or drink after midnight except a sip of water with medications (see medication instructions below)  FYI: For your safety, and to allow [place] to monitor your vital signs accurately during the surgery/procedure we request that   if you have artificial nails, gel coating, SNS etc. Please have those removed prior to your surgery/procedure. Not having the nail coverings /polish removed may result in cancellation or delay of your surgery/procedure.   Medication Instructions:   Continue your anticoagulant: Eliquis   You will need to continue your anticoagulant after your procedure until you  are told by your  Provider that it is safe to stop   Labs: CBC, BMET 1 week before any [place].  You must have a responsible person to drive you home and stay in the waiting area during your procedure. Failure to do so could result in cancellation.  Bring your insurance cards.  *Special Note: Every effort is made to have your procedure done on time. Occasionally there are emergencies that occur at the hospital that may cause delays. Please be patient if a delay does occur.     Time Spent with Patient: I have spent a total of 35 minutes with patient reviewing hospital notes, telemetry, EKGs, labs and examining the patient as well as establishing an assessment and plan that was discussed with the patient.  > 50% of time was spent in direct patient care.  Signed, Lenna Gilford. Flora Lipps, MD, [facility]  [facility]  [address] [place], [place] [address] [phone]  10/18/2020 10:43 AM

## 2020-10-17 NOTE — H&P (View-Only) (Signed)
Cardiology Office Note:   Date:  10/18/2020  NAME:  Edward Campos Condrey    MRN: [id number] DOB:  06/11/1932   PCP:  Patient, No Pcp Per (Inactive)  Cardiologist:  Reatha HarpsWesley T O'Neal, MD  Electrophysiologist:  None   Referring MD: No ref. provider found   Chief Complaint  Patient presents with   Atrial Flutter    History of Present Illness:   Edward Campos Leisure is a 87 y.o. male with a hx of Aflutter, PAD, HLD who presents for follow-up. Seen last week and found to be in AFL after one cataract surgery. We recommend rate control and to complete the other eye. He presents today to discuss DCCV. EKG shows he is still in atrial flutter.  Rate is controlled at 100 bpm.  He is on Eliquis.  No missed doses in the last week.  We discussed TEE/DCCV versus DCCV.  He would like to forego any TEE and just pursuing DCCV.  Earliest will be 11/03/2020.  No chest pain or trouble breathing.  Denies any symptoms.  No missed doses of Eliquis.  He still does not have a primary care physician.  Lab work from last year was all normal.  He needs repeat lab work.  No bleeding on Eliquis.  Cataract surgery is complete.  He does need a repeat echocardiogram as well.  Would plan to do this in [place].  No symptoms from his atrial flutter.  I did counsel him that in atrial flutter will not resolve.   Problem List 1. PAD -L fem-fem bypass with iliofemoral endarterectomies 06/08/2019 -R common iliac/external iliac stent 06/08/2019 2. AAA -4.7 cm 06/09/2020 3. HLD -T chol 143, HDL 48, LDL 76, TG 101 4. Atrial flutter, typical 10/11/2020 -CHADSVASC=3 (age, PAD)  Past Medical History: Past Medical History:  Diagnosis Date   AAA (abdominal aortic aneurysm) (HCC)    Anxiety    Arthritis    BPH (benign prostatic hyperplasia)    Dizziness    Hyperlipidemia     Past Surgical History: Past Surgical History:  Procedure Laterality Date   ABDOMINAL AORTOGRAM W/LOWER EXTREMITY N/A 05/06/2019   Procedure: ABDOMINAL AORTOGRAM W/LOWER  EXTREMITY;  Surgeon: Cephus Shellinglark, Christopher J, MD;  [facility]: [facility];  Service: Cardiovascular;  Laterality: N/A;   APPLICATION OF WOUND VAC  07/06/2019   Procedure: Application Of Wound Vac Left Groin;  Surgeon: Sherren KernsFields, Charles E, MD;  [facility];  Service: Vascular;;   ENDARTERECTOMY FEMORAL Bilateral 06/08/2019   Procedure: ENDARTERECTOMY RIGHT COMMON FEMORAL; ENDARTERECTOMY RIGHT EXTERNAL ILIAC; ENDARTERECTOMY LEFT COMMON FEMORAL; ENDARTERECTOMY LEFT EXTERNAL ILIAC;  Surgeon: Sherren KernsFields, Charles E, MD;  Location: [facility] OR;  Service: Vascular;  Laterality: Bilateral;   FEMORAL-FEMORAL BYPASS GRAFT Bilateral 06/08/2019   Procedure: BYPASS GRAFT FEMORAL-FEMORAL ARTERY;  Surgeon: Sherren KernsFields, Charles E, MD;  [facility];  Service: Vascular;  Laterality: Bilateral;   GROIN DEBRIDEMENT Left 07/06/2019   Procedure: INCISION AND DRAINAGE OF LEFT GROIN;  Surgeon: Sherren KernsFields, Charles E, MD;  Location: [facility] OR;  Service: Vascular;  Laterality: Left;   growth removed from chest     HEMORRHOID SURGERY     HERNIA REPAIR  12 yrs ago   left inguinal hernia   INCISE AND DRAIN ABCESS Left 07/06/2019   Incision and drainage left groin placement of VAC dressing   INGUINAL HERNIA REPAIR  10/16/2010   Procedure: HERNIA REPAIR INGUINAL ADULT;  Surgeon: Dalia HeadingMark A Jenkins;  Location: [facility];  Service: General;  Laterality: Right;   INSERTION OF ILIAC STENT  Right 06/08/2019   Procedure: INSERTION OF RIGHT COMMON ILIAC AND EXTERNAL ILICAC STENT;  Surgeon: Sherren Kerns, MD;  Location: [facility] OR;  Service: Vascular;  Laterality: Right;   PATCH ANGIOPLASTY Right 06/08/2019   Procedure: Patch Angioplasty Right Common Femoral;  Surgeon: Sherren Kerns, MD;  Location: [facility] OR;  Service: Vascular;  Laterality: Right;    Current Medications: Current Meds  Medication Sig   apixaban (ELIQUIS) 5 MG TABS tablet Take 1 tablet (5 mg total) by mouth 2 (two) times daily.   metoprolol tartrate (LOPRESSOR) 25 MG tablet Take 1 tablet (25 mg  total) by mouth 2 (two) times daily.   Multiple Vitamins-Minerals (MULTIVITAMIN WITH MINERALS) tablet Take 1 tablet by mouth daily with lunch.   ofloxacin (OCUFLOX) 0.3 % ophthalmic solution Place into the right eye.   prednisoLONE acetate (PRED FORTE) 1 % ophthalmic suspension SMARTSIG:In Eye(s)   rosuvastatin (CRESTOR) 20 MG tablet Take 1 tablet (20 mg total) by mouth daily.   Simethicone (GAS RELIEF EXTRA STRENGTH PO) Take by mouth as needed.     Allergies:    Penicillins   Social History: Social History   Socioeconomic History   Marital status: Divorced    Spouse name: Not on file   Number of children: 3   Years of education: Not on file   Highest education level: Not on file  Occupational History   Not on file  Tobacco Use   Smoking status: Former    Packs/day: 0.25    Years: 70.00    Pack years: 17.50    Types: Cigarettes    Quit date: 05/30/2019    Years since quitting: 1.3   Smokeless tobacco: Never  Vaping Use   Vaping Use: Never used  Substance and Sexual Activity   Alcohol use: Yes    Alcohol/week: 3.0 standard drinks    Types: 3 Shots of liquor per week    Comment: 3 2oz. shots of bourbon/day   Drug use: No   Sexual activity: Not on file  Other Topics Concern   Not on file  Social History Narrative   Not on file   Social Determinants of Health   Financial Resource Strain: Not on file  Food Insecurity: Not on file  Transportation Needs: Not on file  Physical Activity: Not on file  Stress: Not on file  Social Connections: Not on file     Family History: The patient's family history includes Stroke in his mother. There is no history of Pseudochol deficiency, Malignant hyperthermia, Hypotension, or Anesthesia problems.  ROS:   All other ROS reviewed and negative. Pertinent positives noted in the HPI.     EKGs/Labs/Other Studies Reviewed:   The following studies were personally reviewed by me today:  EKG:  EKG is ordered today.  The ekg ordered  today demonstrates atrial flutter heart rate 100, no acute ischemic changes or evidence of infarction, and was personally reviewed by me.   NM Stress 06/05/2019 The left ventricular ejection fraction is normal (55-65%). Nuclear stress EF: 55%. There was no ST segment deviation noted during stress. Defect 1: There is a small defect of mild severity present in the apex location. The study is normal. This is a low risk study.   Normal stress nuclear study with mild apical thinning but no ischemia.  Gated ejection fraction 55% with normal wall motion.  TTE 06/02/2019  1. Left ventricular ejection fraction, by estimation, is 70 to 75%. The  left ventricle has hyperdynamic function. The left  ventricle has no  regional wall motion abnormalities. There is mild left ventricular  hypertrophy. Left ventricular diastolic  parameters are consistent with Grade I diastolic dysfunction (impaired  relaxation).   2. Right ventricular systolic function is normal. The right ventricular  size is normal.   3. The mitral valve is normal in structure. No evidence of mitral valve  regurgitation. No evidence of mitral stenosis.   4. The aortic valve was not well visualized. Aortic valve regurgitation  is not visualized. No aortic stenosis is present.   5. The inferior vena cava is normal in size with greater than 50%  respiratory variability, suggesting right atrial pressure of 3 mmHg.  Recent Labs: No results found for requested labs within last 8760 hours.   Recent Lipid Panel    Component Value Date/Time   CHOL 143 05/29/2019 1450   TRIG 101 05/29/2019 1450   HDL 48 05/29/2019 1450   CHOLHDL 3.0 05/29/2019 1450   LDLCALC 76 05/29/2019 1450    Physical Exam:   VS:  BP (!) 137/91 (BP Location: Left Arm, Patient Position: Sitting, Cuff Size: Normal)   Pulse (!) 114   Ht 5\' 9"  (1.753 m)   Wt 173 lb (78.5 kg)   SpO2 99%   BMI 25.55 kg/m    Wt Readings from Last 3 Encounters:  10/18/20 173 lb  (78.5 kg)  10/11/20 171 lb (77.6 kg)  06/09/20 169 lb 1.6 oz (76.7 kg)    General: Well nourished, well developed, in no acute distress Head: Atraumatic, normal size  Eyes: PEERLA, EOMI  Neck: Supple, no JVD Endocrine: No thryomegaly Cardiac: Normal S1, S2; irregular rhythm, no murmurs Lungs: Clear to auscultation bilaterally, no wheezing, rhonchi or rales  Abd: Soft, nontender, no hepatomegaly  Ext: No edema, pulses 2+ Musculoskeletal: No deformities, BUE and BLE strength normal and equal Skin: Warm and dry, no rashes   Neuro: Alert and oriented to person, place, time, and situation, CNII-XII grossly intact, no focal deficits  Psych: Normal mood and affect   ASSESSMENT:   Edward Campos is a 87 y.o. male who presents for the following: 1. Typical atrial flutter (HCC)   2. PAD (peripheral artery disease) (HCC)   3. AAA (abdominal aortic aneurysm) without rupture (HCC)   4. Mixed hyperlipidemia     PLAN:   1. Typical atrial flutter (HCC) -Remains in atrial flutter.  CHA2DS2-VASc score 3.  On Eliquis.  No missed doses in the last 1 week.  We will set him up for a DCCV on 11/03/2020.  He will give 11/05/2020 lab work 1 week before.  This will include a BMP, CBC and TSH.  Lab work last year was normal.  He also needs a repeat echocardiogram which we will obtain at [facility].  He really denies any symptoms from this but I fear his atrial flutter will not resolve.  Hopefully cardioversion will do the trick.  I suspect the trigger was cataract surgery.  He will then see me back roughly 2 weeks after his cardioversion we will likely pursue a monitor.  2. PAD (peripheral artery disease) (HCC) 3. AAA (abdominal aortic aneurysm) without rupture (HCC) 4. Mixed hyperlipidemia -Extensive peripheral vascular surgery last year.  Doing well.  He has a AAA of 4.7 cm which is being followed by vascular surgery.  He will need yearly ultrasound on this.  He is on Lipitor.  Most recent LDL 76.  We will repeat a  fasting lipid when he does lab work next  week.  He has no symptoms of angina.  Underwent a stress test that was normal.  Echocardiogram was also normal last year.  Shared Decision Making/Informed Consent The risks (stroke, cardiac arrhythmias rarely resulting in the need for a temporary or permanent pacemaker, skin irritation or burns and complications associated with conscious sedation including aspiration, arrhythmia, respiratory failure and death), benefits (restoration of normal sinus rhythm) and alternatives of a direct current cardioversion were explained in detail to Mr. Kawai and he agrees to proceed.   Disposition: Return in about 1 month (around 11/18/2020).  Medication Adjustments/Labs and Tests Ordered: Current medicines are reviewed at length with the patient today.  Concerns regarding medicines are outlined above.  Orders Placed This Encounter  Procedures   CBC   TSH   Basic metabolic panel   Lipid panel   EKG 12-Lead   ECHOCARDIOGRAM COMPLETE    No orders of the defined types were placed in this encounter.   Patient Instructions  Medication Instructions:  The current medical regimen is effective;  continue present plan and medications.  *If you need a refill on your cardiac medications before your next appointment, please call your pharmacy*   Lab Work: BMET, LIPID, TSH, CBC- 1 week before in [place]. You may go to any [facility] to have these completed. (Fasting, nothing to eat or drink)   If you have labs (blood work) drawn today and your tests are completely normal, you will receive your results only by: MyChart Message (if you have MyChart) OR A paper copy in the mail If you have any lab test that is abnormal or we need to change your treatment, we will call you to review the results.   Testing/Procedures:  Echocardiogram ([place]) - Your physician has requested that you have an echocardiogram. Echocardiography is a painless test that uses sound waves to  create images of your heart. It provides your doctor with information about the size and shape of your heart and how well your heart's chambers and valves are working. This procedure takes approximately one hour. There are no restrictions for this procedure.   Your physician has requested that you have a Cardioversion. Electrical Cardioversion uses a jolt of electricity to your heart either through paddles or wired patches attached to your chest. This is a controlled, usually prescheduled, procedure. This procedure is done at the hospital and you are not awake during the procedure. You usually go home the day of the procedure. Please see the instruction sheet given to you today for more information.    Follow-Up: At [facility], you and your health needs are our priority.  As part of our continuing mission to provide you with exceptional heart care, we have created designated Provider Care Teams.  These Care Teams include your primary Cardiologist (physician) and Advanced Practice Providers (APPs -  Physician Assistants and Nurse Practitioners) who all work together to provide you with the care you need, when you need it.  We recommend signing up for the patient portal called "MyChart".  Sign up information is provided on this After Visit Summary.  MyChart is used to connect with patients for Virtual Visits (Telemedicine).  Patients are able to view lab/test results, encounter notes, upcoming appointments, etc.  Non-urgent messages can be sent to your provider as well.   To learn more about what you can do with MyChart, go to [email or web address].    Your next appointment:   September 6th at 10:00 AM  The format for your next appointment:  In Person  Provider:   Lennie Odor, MD   Other Instructions You are scheduled for a Cardioversion on August 25th with Dr. Royann Shivers.  Please arrive at the [facility] (Main Entrance A) at [facility]: [address] [place], [place] [address]  at 9 am. (1 hour prior to procedure unless lab work is needed; if lab work is needed arrive 1.5 hours ahead)  DIET: Nothing to eat or drink after midnight except a sip of water with medications (see medication instructions below)  FYI: For your safety, and to allow [place] to monitor your vital signs accurately during the surgery/procedure we request that   if you have artificial nails, gel coating, SNS etc. Please have those removed prior to your surgery/procedure. Not having the nail coverings /polish removed may result in cancellation or delay of your surgery/procedure.   Medication Instructions:   Continue your anticoagulant: Eliquis   You will need to continue your anticoagulant after your procedure until you  are told by your  Provider that it is safe to stop   Labs: CBC, BMET 1 week before any [place] [facility].  You must have a responsible person to drive you home and stay in the waiting area during your procedure. Failure to do so could result in cancellation.  Bring your insurance cards.  *Special Note: Every effort is made to have your procedure done on time. Occasionally there are emergencies that occur at the hospital that may cause delays. Please be patient if a delay does occur.     Time Spent with Patient: I have spent a total of 35 minutes with patient reviewing hospital notes, telemetry, EKGs, labs and examining the patient as well as establishing an assessment and plan that was discussed with the patient.  > 50% of time was spent in direct patient care.  Signed, Lenna Gilford. Flora Lipps, MD, [facility]  [facility]  [address] [place], [place] [address] [phone]  10/18/2020 10:43 AM

## 2020-10-18 ENCOUNTER — Ambulatory Visit (INDEPENDENT_AMBULATORY_CARE_PROVIDER_SITE_OTHER): Payer: Medicare Other | Admitting: Cardiovascular Disease

## 2020-10-18 ENCOUNTER — Encounter: Payer: Self-pay | Admitting: Cardiovascular Disease

## 2020-10-18 ENCOUNTER — Other Ambulatory Visit: Payer: Self-pay

## 2020-10-18 VITALS — BP 137/91 | HR 114 | Ht 69.0 in | Wt 173.0 lb

## 2020-10-18 DIAGNOSIS — I739 Peripheral vascular disease, unspecified: Secondary | ICD-10-CM

## 2020-10-18 DIAGNOSIS — I483 Typical atrial flutter: Secondary | ICD-10-CM | POA: Diagnosis not present

## 2020-10-18 DIAGNOSIS — I714 Abdominal aortic aneurysm, without rupture, unspecified: Secondary | ICD-10-CM

## 2020-10-18 DIAGNOSIS — E782 Mixed hyperlipidemia: Secondary | ICD-10-CM | POA: Diagnosis not present

## 2020-10-18 NOTE — Patient Instructions (Signed)
Medication Instructions:  The current medical regimen is effective;  continue present plan and medications.  *If you need a refill on your cardiac medications before your next appointment, please call your pharmacy*   Lab Work: BMET, LIPID, TSH, CBC- 1 week before in [place]. You may go to any [facility] to have these completed. (Fasting, nothing to eat or drink)   If you have labs (blood work) drawn today and your tests are completely normal, you will receive your results only by: MyChart Message (if you have MyChart) OR A paper copy in the mail If you have any lab test that is abnormal or we need to change your treatment, we will call you to review the results.   Testing/Procedures:  Echocardiogram ([place]) - Your physician has requested that you have an echocardiogram. Echocardiography is a painless test that uses sound waves to create images of your heart. It provides your doctor with information about the size and shape of your heart and how well your heart's chambers and valves are working. This procedure takes approximately one hour. There are no restrictions for this procedure.   Your physician has requested that you have a Cardioversion. Electrical Cardioversion uses a jolt of electricity to your heart either through paddles or wired patches attached to your chest. This is a controlled, usually prescheduled, procedure. This procedure is done at the hospital and you are not awake during the procedure. You usually go home the day of the procedure. Please see the instruction sheet given to you today for more information.    Follow-Up: At [facility], you and your health needs are our priority.  As part of our continuing mission to provide you with exceptional heart care, we have created designated Provider Care Teams.  These Care Teams include your primary Cardiologist (physician) and Advanced Practice Providers (APPs -  Physician Assistants and Nurse Practitioners) who all work  together to provide you with the care you need, when you need it.  We recommend signing up for the patient portal called "MyChart".  Sign up information is provided on this After Visit Summary.  MyChart is used to connect with patients for Virtual Visits (Telemedicine).  Patients are able to view lab/test results, encounter notes, upcoming appointments, etc.  Non-urgent messages can be sent to your provider as well.   To learn more about what you can do with MyChart, go to [email or web address].    Your next appointment:   September 6th at 10:00 AM  The format for your next appointment:   In Person  Provider:   Lennie Odor, MD   Other Instructions You are scheduled for a Cardioversion on August 25th with Dr. Royann Shivers.  Please arrive at the [facility] (Main Entrance A) at [facility]: [address] [place], [place] [address] at 9 am. (1 hour prior to procedure unless lab work is needed; if lab work is needed arrive 1.5 hours ahead)  DIET: Nothing to eat or drink after midnight except a sip of water with medications (see medication instructions below)  FYI: For your safety, and to allow [place] to monitor your vital signs accurately during the surgery/procedure we request that   if you have artificial nails, gel coating, SNS etc. Please have those removed prior to your surgery/procedure. Not having the nail coverings /polish removed may result in cancellation or delay of your surgery/procedure.   Medication Instructions:   Continue your anticoagulant: Eliquis   You will need to continue your anticoagulant after your procedure  until you  are told by your  Provider that it is safe to stop   Labs: CBC, BMET 1 week before any [place] [facility].  You must have a responsible person to drive you home and stay in the waiting area during your procedure. Failure to do so could result in cancellation.  Bring your insurance cards.  *Special Note: Every effort is made to have your  procedure done on time. Occasionally there are emergencies that occur at the hospital that may cause delays. Please be patient if a delay does occur.

## 2020-10-24 ENCOUNTER — Telehealth: Payer: Self-pay | Admitting: Cardiovascular Disease

## 2020-10-24 NOTE — Telephone Encounter (Signed)
Returned call to patient, made patient aware of the following:  Knee injections (if needed) are fine on eliquis and no need to stop for this. He should be able to be treated with medications regardless. They can inject if needed without a need to stop eliquis. He just had cataract surgery on eliquis uninterrupted.   Gerri Spore T. Flora Lipps, MD, [facility]  [facility]  [address], [address]  [place], [place] [address]  [phone]  4:16 PM   Patient states that his knee pain is worse. Advised patient that he should go to Urgent Care and have his knee evaluated. Patient states he will do this. Patient aware of PharmD's recommendations and will take Acetaminophen for pain and will continue Eliquis. Patient reports that he has missed NO doses of his Eliquis. Advised patient to call back with any issues, questions, or concerns. Patient verbalized understanding.

## 2020-10-24 NOTE — Telephone Encounter (Signed)
This is not something that would be caused by Eliquis unless he was bleeding in the knee, then would expect some bruising to be visible.  Doesn't sound like a DVT.  Patient currently scheduled for DCCV later this month, so imperative that he not stop Eliquis, or procedure will have to be cancelled.  Recommend acetaminophen to help with any pain - will let Dr. Flora Lipps add any thoughts as well

## 2020-10-24 NOTE — Telephone Encounter (Signed)
Returned call to patient who states that a few days ago his left knee started hurting. Patient states that his Left knee is slightly red and warm to the touch and very painful. Patient denies any pain in his calf or upper leg, pain is localized to the knee only. Patient states that he has not recently hurt his knee or had any injury that he could think of. Patient reports that he does have a history of gout but states that it is not swollen like all of the other times that he had gout. Patient states he does not have a PCP and usually goes to Urgent care for any issues but states he did not go this time due to him being on Eliquis and states he wasn't sure if they would do anything since he is taking this. Patient also reports he has tried cold and heat with no relief. Patient would like to know if there is anything he could take for pain over the counter for his knee. Advised patient that we usually tell patients to stay away from NSAIDs while on Eliquis but advised that I would forward message to Dr. Flora Lipps and our pharmD. Patient verbalized understanding.

## 2020-10-24 NOTE — Telephone Encounter (Signed)
Pt c/o medication issue:  1. Name of Medication: apixaban (ELIQUIS) 5 MG TABS tablet  2. How are you currently taking this medication (dosage and times per day)? Take 1 tablet (5 mg total) by mouth 2 (two) times daily.  3. Are you having a reaction (difficulty breathing--STAT)? Knee is sore and tender without injury   4. What is your medication issue?  pt is having trouble with his knee, states he is having pain and believes that it is coming from Eliquis.

## 2020-10-27 DIAGNOSIS — E782 Mixed hyperlipidemia: Secondary | ICD-10-CM | POA: Diagnosis not present

## 2020-10-27 DIAGNOSIS — I483 Typical atrial flutter: Secondary | ICD-10-CM | POA: Diagnosis not present

## 2020-10-28 LAB — LIPID PANEL
Chol/HDL Ratio: 2.4 ratio (ref 0.0–5.0)
Cholesterol, Total: 105 mg/dL (ref 100–199)
HDL: 43 mg/dL (ref >39)
LDL Chol Calc (NIH): 50 mg/dL (ref 0–99)
Triglycerides: 50 mg/dL (ref 0–149)
VLDL Cholesterol Cal: 12 mg/dL (ref 5–40)

## 2020-10-28 LAB — BASIC METABOLIC PANEL
BUN/Creatinine Ratio: 18 (ref 10–24)
BUN: 17 mg/dL (ref 8–27)
CO2: 23 mmol/L (ref 20–29)
Calcium: 9 mg/dL (ref 8.6–10.2)
Chloride: 102 mmol/L (ref 96–106)
Creatinine, Ser: 0.93 mg/dL (ref 0.76–1.27)
Glucose: 95 mg/dL (ref 65–99)
Potassium: 4.7 mmol/L (ref 3.5–5.2)
Sodium: 138 mmol/L (ref 134–144)
eGFR: 80 mL/min/{1.73_m2} (ref >59)

## 2020-10-28 LAB — CBC
Hematocrit: 41 % (ref 37.5–51.0)
Hemoglobin: 13.9 g/dL (ref 13.0–17.7)
MCH: 30.4 pg (ref 26.6–33.0)
MCHC: 33.9 g/dL (ref 31.5–35.7)
MCV: 90 fL (ref 79–97)
Platelets: 208 10*3/uL (ref 150–450)
RBC: 4.57 x10E6/uL (ref 4.14–5.80)
RDW: 12.7 % (ref 11.6–15.4)
WBC: 6.1 10*3/uL (ref 3.4–10.8)

## 2020-10-28 LAB — TSH: TSH: 4.52 u[IU]/mL — ABNORMAL HIGH (ref 0.450–4.500)

## 2020-10-29 ENCOUNTER — Other Ambulatory Visit: Payer: Self-pay | Admitting: Vascular Surgery

## 2020-11-02 NOTE — Anesthesia Preprocedure Evaluation (Addendum)
Anesthesia Evaluation  Patient identified by MRN, date of birth, ID band Patient awake    Reviewed: Allergy & Precautions, NPO status , Patient's Chart, lab work & pertinent test results  Airway Mallampati: II  TM Distance: >3 FB Neck ROM: Full    Dental  (+) Teeth Intact   Pulmonary neg pulmonary ROS, former smoker,    Pulmonary exam normal        Cardiovascular hypertension, Pt. on home beta blockers and Pt. on medications + Peripheral Vascular Disease  + dysrhythmias Atrial Fibrillation  Rhythm:Irregular Rate:Normal     Neuro/Psych Anxiety negative neurological ROS     GI/Hepatic negative GI ROS, Neg liver ROS,   Endo/Other  negative endocrine ROS  Renal/GU negative Renal ROS  negative genitourinary   Musculoskeletal  (+) Arthritis , Osteoarthritis,    Abdominal (+)  Abdomen: soft.    Peds  Hematology negative hematology ROS (+)   Anesthesia Other Findings   Reproductive/Obstetrics                            Anesthesia Physical Anesthesia Plan  ASA: 3  Anesthesia Plan: General   Post-op Pain Management:    Induction: Intravenous  PONV Risk Score and Plan: 2 and Propofol infusion and Treatment may vary due to age or medical condition  Airway Management Planned: Mask  Additional Equipment: None  Intra-op Plan:   Post-operative Plan: Extubation in OR  Informed Consent: I have reviewed the patients History and Physical, chart, labs and discussed the procedure including the risks, benefits and alternatives for the proposed anesthesia with the patient or authorized representative who has indicated his/her understanding and acceptance.     Dental advisory given  Plan Discussed with: CRNA  Anesthesia Plan Comments:        Anesthesia Quick Evaluation

## 2020-11-03 ENCOUNTER — Encounter (HOSPITAL_COMMUNITY): Payer: Self-pay | Admitting: Cardiovascular Disease

## 2020-11-03 ENCOUNTER — Ambulatory Visit (HOSPITAL_COMMUNITY): Payer: Medicare Other | Admitting: Anesthesiology

## 2020-11-03 ENCOUNTER — Encounter (HOSPITAL_COMMUNITY)
Admission: RE | Disposition: A | Discharge status: Home / Self Care | Payer: Self-pay | Source: Home / Self Care | Attending: Cardiovascular Disease

## 2020-11-03 ENCOUNTER — Ambulatory Visit (HOSPITAL_COMMUNITY)
Admission: RE | Admit: 2020-11-03 | Discharge: 2020-11-03 | Disposition: A | Discharge status: Home / Self Care | Payer: Medicare Other | Attending: Cardiovascular Disease | Admitting: Cardiovascular Disease

## 2020-11-03 DIAGNOSIS — I10 Essential (primary) hypertension: Secondary | ICD-10-CM | POA: Diagnosis not present

## 2020-11-03 DIAGNOSIS — F419 Anxiety disorder, unspecified: Secondary | ICD-10-CM | POA: Diagnosis not present

## 2020-11-03 DIAGNOSIS — Z95820 Peripheral vascular angioplasty status with implants and grafts: Secondary | ICD-10-CM | POA: Insufficient documentation

## 2020-11-03 DIAGNOSIS — E785 Hyperlipidemia, unspecified: Secondary | ICD-10-CM | POA: Diagnosis not present

## 2020-11-03 DIAGNOSIS — E782 Mixed hyperlipidemia: Secondary | ICD-10-CM | POA: Insufficient documentation

## 2020-11-03 DIAGNOSIS — I714 Abdominal aortic aneurysm, without rupture: Secondary | ICD-10-CM | POA: Insufficient documentation

## 2020-11-03 DIAGNOSIS — I739 Peripheral vascular disease, unspecified: Secondary | ICD-10-CM | POA: Diagnosis not present

## 2020-11-03 DIAGNOSIS — Z79899 Other long term (current) drug therapy: Secondary | ICD-10-CM | POA: Diagnosis not present

## 2020-11-03 DIAGNOSIS — Z7901 Long term (current) use of anticoagulants: Secondary | ICD-10-CM | POA: Diagnosis not present

## 2020-11-03 DIAGNOSIS — I4892 Unspecified atrial flutter: Secondary | ICD-10-CM | POA: Insufficient documentation

## 2020-11-03 DIAGNOSIS — Z880 Allergy status to penicillin: Secondary | ICD-10-CM | POA: Diagnosis not present

## 2020-11-03 DIAGNOSIS — Z87891 Personal history of nicotine dependence: Secondary | ICD-10-CM | POA: Insufficient documentation

## 2020-11-03 DIAGNOSIS — I483 Typical atrial flutter: Secondary | ICD-10-CM

## 2020-11-03 HISTORY — PX: CARDIOVERSION: SHX1299

## 2020-11-03 SURGERY — CARDIOVERSION
Anesthesia: General

## 2020-11-03 MED ORDER — LIDOCAINE 2% (20 MG/ML) 5 ML SYRINGE
INTRAMUSCULAR | Status: DC | PRN
  Administered 2020-11-03: 60 mg via INTRAVENOUS

## 2020-11-03 MED ORDER — METOPROLOL TARTRATE 25 MG PO TABS: 12.5000 mg | ORAL_TABLET | Freq: Two times a day (BID) | ORAL | 3 refills | Status: DC

## 2020-11-03 MED ORDER — SODIUM CHLORIDE 0.9 % IV SOLN: INTRAVENOUS | Status: DC | PRN

## 2020-11-03 MED ORDER — GLYCOPYRROLATE PF 0.2 MG/ML IJ SOSY
PREFILLED_SYRINGE | INTRAMUSCULAR | Status: DC | PRN
  Administered 2020-11-03: .2 mg via INTRAVENOUS

## 2020-11-03 MED ORDER — PROPOFOL 10 MG/ML IV BOLUS
INTRAVENOUS | Status: DC | PRN
  Administered 2020-11-03: 70 mg via INTRAVENOUS

## 2020-11-03 MED ORDER — SODIUM CHLORIDE 0.9 % IV SOLN
INTRAVENOUS | Status: AC | PRN
  Administered 2020-11-03: 20 mL via INTRAMUSCULAR

## 2020-11-03 NOTE — Discharge Instructions (Signed)

## 2020-11-03 NOTE — Anesthesia Procedure Notes (Signed)
Procedure Name: General with mask airway Date/Time: 11/03/2020 9:22 AM Performed by: Drema Pry, CRNA Pre-anesthesia Checklist: Patient identified, Emergency Drugs available, Suction available, Patient being monitored and Timeout performed Patient Re-evaluated:Patient Re-evaluated prior to induction Oxygen Delivery Method: Ambu bag Preoxygenation: Pre-oxygenation with 100% oxygen Induction Type: IV induction Ventilation: Mask ventilation without difficulty

## 2020-11-03 NOTE — Op Note (Signed)
Procedure: Electrical Cardioversion Indications:  Atrial Flutter  Procedure Details:  Consent: Risks of procedure as well as the alternatives and risks of each were explained to the (patient/caregiver).  Consent for procedure obtained.  Time Out: Verified patient identification, verified procedure, site/side was marked, verified correct patient position, special equipment/implants available, medications/allergies/relevent history reviewed, required imaging and test results available.  Performed  Patient placed on cardiac monitor, pulse oximetry, supplemental oxygen as necessary.  Sedation given:  Propofol 70 mg IV, dr. Nance Pew Pacer pads placed anterior and posterior chest.  Cardioverted 1 time(s).  Cardioversion with synchronized biphasic 120J shock.  Evaluation: Findings: Post procedure EKG shows:  sinus bradycardia 45 bpm Complications: None Patient did tolerate procedure well.  Time Spent Directly with the Patient:  30 minutes   Maritza Hosterman 11/03/2020, 9:27 AM

## 2020-11-03 NOTE — Interval H&P Note (Signed)
History and Physical Interval Note:  11/03/2020 9:05 AM  Edward Campos  has presented today for surgery, with the diagnosis of AFLUTTER.  The various methods of treatment have been discussed with the patient and family. After consideration of risks, benefits and other options for treatment, the patient has consented to  Procedure(s): CARDIOVERSION (N/A) as a surgical intervention.  The patient's history has been reviewed, patient examined, no change in status, stable for surgery.  I have reviewed the patient's chart and labs.  Questions were answered to the patient's satisfaction.     [name]

## 2020-11-03 NOTE — Anesthesia Postprocedure Evaluation (Signed)
Anesthesia Post Note  Patient: Edward Campos  Procedure(s) Performed: CARDIOVERSION     Patient location during evaluation: Endoscopy Anesthesia Type: General Level of consciousness: awake and alert Pain management: pain level controlled Vital Signs Assessment: post-procedure vital signs reviewed and stable Respiratory status: spontaneous breathing, nonlabored ventilation, respiratory function stable and patient connected to nasal cannula oxygen Cardiovascular status: blood pressure returned to baseline and stable Postop Assessment: no apparent nausea or vomiting Anesthetic complications: no   No notable events documented.  Last Vitals:  Vitals:   11/03/20 1000 11/03/20 1008  BP: (!) 85/56 111/67  Pulse: (!) 52 (!) 55  Resp: 12 12  Temp:    SpO2: 99% 100%    Last Pain:  Vitals:   11/03/20 1008  TempSrc:   PainSc: 0-No pain                 Earl Lites P Herschell Virani

## 2020-11-03 NOTE — Transfer of Care (Signed)
Immediate Anesthesia Transfer of Care Note  Patient: Rodrigus Alabi  Procedure(s) Performed: CARDIOVERSION  Patient Location: PACU and Endoscopy Unit  Anesthesia Type:General  Level of Consciousness: drowsy and responds to stimulation  Airway & Oxygen Therapy: Patient Spontanous Breathing  Post-op Assessment: Report given to RN and Post -op Vital signs reviewed and stable  Post vital signs: Reviewed and stable  Last Vitals:  Vitals Value Taken Time  BP 139/117 11/03/20 0929  Temp    Pulse 44 11/03/20 0929  Resp 11 11/03/20 0929  SpO2 100 % 11/03/20 0929    Last Pain:  Vitals:   11/03/20 0919  TempSrc: Oral  PainSc: 0-No pain         Complications: No notable events documented.

## 2020-11-04 ENCOUNTER — Encounter (HOSPITAL_COMMUNITY): Payer: Self-pay | Admitting: Cardiovascular Disease

## 2020-11-10 ENCOUNTER — Other Ambulatory Visit: Payer: Self-pay

## 2020-11-10 ENCOUNTER — Ambulatory Visit (HOSPITAL_COMMUNITY)
Admission: RE | Admit: 2020-11-10 | Discharge: 2020-11-10 | Disposition: A | Discharge status: Home / Self Care | Payer: Medicare Other | Source: Ambulatory Visit | Attending: Cardiovascular Disease | Admitting: Cardiovascular Disease

## 2020-11-10 DIAGNOSIS — I483 Typical atrial flutter: Secondary | ICD-10-CM | POA: Diagnosis not present

## 2020-11-10 LAB — ECHOCARDIOGRAM COMPLETE
AR max vel: 2.16 cm2
AV Area VTI: 2.29 cm2
AV Area mean vel: 2.05 cm2
AV Mean grad: 4.2 mmHg
AV Peak grad: 7.6 mmHg
Ao pk vel: 1.38 m/s
Area-P 1/2: 2.55 cm2
MV M vel: 5.56 m/s
MV Peak grad: 123.7 mmHg
S' Lateral: 2.2 cm

## 2020-11-10 NOTE — Progress Notes (Signed)
*  PRELIMINARY RESULTS* Echocardiogram 2D Echocardiogram has been performed.  Stacey Drain 11/10/2020, 12:17 PM

## 2020-11-14 NOTE — Progress Notes (Signed)
Cardiology Office Note:   Date:  11/15/2020  NAME:  Edward JarvisRichard Campos    MRN: [id number] DOB:  10/30/1932   PCP:  Patient, No Pcp Per (Inactive)  Cardiologist:  Reatha HarpsWesley T O'Neal, MD  Electrophysiologist:  None   Referring MD: No ref. provider found   Chief Complaint  Patient presents with   Follow-up         History of Present Illness:   Edward JarvisRichard Campos is a 87 y.o. male with a hx of PAD, AAA, AFL who presents for follow-up. Recent DCCV for Aflutter.  EKG shows he is in sinus rhythm.  Seems to be doing well since his procedure.  No further recurrence per his report.  We discussed pursuing a monitor to make sure there is no atrial fibrillation.  He had no symptoms from his atrial flutter.  He continues to have no symptoms.  He is taking Eliquis but it is very costly.  For now we will continue this.  He is able to afford this.  We did discuss possibly coming off he has no further recurrence of atrial flutter.  We also discussed watchman.  For now he would like to continue with medication.  Denies chest pain, shortness of breath in office today.  Problem List 1. PAD -L fem-fem bypass with iliofemoral endarterectomies 06/08/2019 -R common iliac/external iliac stent 06/08/2019 2. AAA -4.7 cm 06/09/2020 3. HLD -T chol 105, HDL 43, LDL 50, TG 50 4. Atrial flutter, typical 10/11/2020 -CHADSVASC=3 (age, PAD) -DCCV 11/03/2020  Past Medical History: Past Medical History:  Diagnosis Date   AAA (abdominal aortic aneurysm) (HCC)    Anxiety    Arthritis    BPH (benign prostatic hyperplasia)    Dizziness    Hyperlipidemia     Past Surgical History: Past Surgical History:  Procedure Laterality Date   ABDOMINAL AORTOGRAM W/LOWER EXTREMITY N/A 05/06/2019   Procedure: ABDOMINAL AORTOGRAM W/LOWER EXTREMITY;  Surgeon: Cephus Shellinglark, Christopher J, MD;  Location: [facility];  Service: Cardiovascular;  Laterality: N/A;   APPLICATION OF WOUND VAC  07/06/2019   Procedure: Application Of Wound Vac Left  Groin;  Surgeon: Sherren KernsFields, Charles E, MD;  [facility];  Service: Vascular;;   CARDIOVERSION N/A 11/03/2020   Procedure: CARDIOVERSION;  Surgeon: Thurmon Fairroitoru, Mihai, MD;  Location: [facility] ENDOSCOPY;  Service: Cardiovascular;  Laterality: N/A;   ENDARTERECTOMY FEMORAL Bilateral 06/08/2019   Procedure: ENDARTERECTOMY RIGHT COMMON FEMORAL; ENDARTERECTOMY RIGHT EXTERNAL ILIAC; ENDARTERECTOMY LEFT COMMON FEMORAL; ENDARTERECTOMY LEFT EXTERNAL ILIAC;  Surgeon: Sherren KernsFields, Charles E, MD;  Location: [facility] OR;  Service: Vascular;  Laterality: Bilateral;   FEMORAL-FEMORAL BYPASS GRAFT Bilateral 06/08/2019   Procedure: BYPASS GRAFT FEMORAL-FEMORAL ARTERY;  Surgeon: Sherren KernsFields, Charles E, MD;  [facility];  Service: Vascular;  Laterality: Bilateral;   GROIN DEBRIDEMENT Left 07/06/2019   Procedure: INCISION AND DRAINAGE OF LEFT GROIN;  Surgeon: Sherren KernsFields, Charles E, MD;  Location: [facility] OR;  Service: Vascular;  Laterality: Left;   growth removed from chest     HEMORRHOID SURGERY     HERNIA REPAIR  12 yrs ago   left inguinal hernia   INCISE AND DRAIN ABCESS Left 07/06/2019   Incision and drainage left groin placement of VAC dressing   INGUINAL HERNIA REPAIR  10/16/2010   Procedure: HERNIA REPAIR INGUINAL ADULT;  Surgeon: Dalia HeadingMark A Jenkins;  Location: [facility];  Service: General;  Laterality: Right;   INSERTION OF ILIAC STENT Right 06/08/2019   Procedure: INSERTION OF RIGHT COMMON ILIAC AND EXTERNAL ILICAC STENT;  Surgeon: Darrick PennaFields,  Janetta Hora, MD;  Location: [facility] OR;  Service: Vascular;  Laterality: Right;   PATCH ANGIOPLASTY Right 06/08/2019   Procedure: Patch Angioplasty Right Common Femoral;  Surgeon: Sherren Kerns, MD;  Location: [facility] OR;  Service: Vascular;  Laterality: Right;    Current Medications: Current Meds  Medication Sig   acetaminophen (TYLENOL) 650 MG CR tablet Take 650 mg by mouth 2 (two) times daily as needed for pain.   apixaban (ELIQUIS) 5 MG TABS tablet Take 1 tablet (5 mg total) by mouth 2 (two) times daily.    metoprolol tartrate (LOPRESSOR) 25 MG tablet Take 0.5 tablets (12.5 mg total) by mouth 2 (two) times daily.   Multiple Vitamins-Minerals (MULTIVITAMIN WITH MINERALS) tablet Take 1 tablet by mouth daily.   rosuvastatin (CRESTOR) 20 MG tablet TAKE 1 TABLET(20 MG) BY MOUTH DAILY   Simethicone (GAS RELIEF EXTRA STRENGTH PO) Take 1 tablet by mouth as needed.     Allergies:    Penicillins   Social History: Social History   Socioeconomic History   Marital status: Divorced    Spouse name: Not on file   Number of children: 3   Years of education: Not on file   Highest education level: Not on file  Occupational History   Not on file  Tobacco Use   Smoking status: Former    Packs/day: 0.25    Years: 70.00    Pack years: 17.50    Types: Cigarettes    Quit date: 05/30/2019    Years since quitting: 1.4   Smokeless tobacco: Never  Vaping Use   Vaping Use: Never used  Substance and Sexual Activity   Alcohol use: Yes    Alcohol/week: 3.0 standard drinks    Types: 3 Shots of liquor per week    Comment: 3 2oz. shots of bourbon/day   Drug use: No   Sexual activity: Not on file  Other Topics Concern   Not on file  Social History Narrative   Not on file   Social Determinants of Health   Financial Resource Strain: Not on file  Food Insecurity: Not on file  Transportation Needs: Not on file  Physical Activity: Not on file  Stress: Not on file  Social Connections: Not on file     Family History: The patient's family history includes Stroke in his mother. There is no history of Pseudochol deficiency, Malignant hyperthermia, Hypotension, or Anesthesia problems.  ROS:   All other ROS reviewed and negative. Pertinent positives noted in the HPI.     EKGs/Labs/Other Studies Reviewed:   The following studies were personally reviewed by me today:  EKG:  EKG is ordered today.  The ekg ordered today demonstrates sinus bradycardia heart rate 54 no acute ischemic changes or evidence of  infarction, and was personally reviewed by me.   TTE 11/10/2020   1. Left ventricular ejection fraction, by estimation, is 65 to 70%. The  left ventricle has normal function. The left ventricle has no regional  wall motion abnormalities. Left ventricular diastolic parameters are  indeterminate. Elevated left atrial  pressure.   2. Right ventricular systolic function is normal. The right ventricular  size is normal. There is normal pulmonary artery systolic pressure.   3. Left atrial size was moderately dilated.   4. Right atrial size was mildly dilated.   5. The mitral valve is abnormal. Mild mitral valve regurgitation. No  evidence of mitral stenosis.   6. The aortic valve was not well visualized. There is mild calcification  of the aortic valve. There is mild thickening of the aortic valve. Aortic  valve regurgitation is not visualized. No aortic stenosis is present.   7. The inferior vena cava is dilated in size with >50% respiratory  variability, suggesting right atrial pressure of 8 mmHg.   Recent Labs: 10/27/2020: BUN 17; Creatinine, Ser 0.93; Hemoglobin 13.9; Platelets 208; Potassium 4.7; Sodium 138; TSH 4.520   Recent Lipid Panel    Component Value Date/Time   CHOL 105 10/27/2020 1038   TRIG 50 10/27/2020 1038   HDL 43 10/27/2020 1038   CHOLHDL 2.4 10/27/2020 1038   LDLCALC 50 10/27/2020 1038    Physical Exam:   VS:  BP 132/74 (BP Location: Left Arm, Patient Position: Sitting, Cuff Size: Normal)   Pulse (!) 54   Ht  (1.753 m)   Wt 166 lb 12.8 oz (75.7 kg)   SpO2 97%   BMI 24.63 kg/m    Wt Readings from Last 3 Encounters:  11/15/20 166 lb 12.8 oz (75.7 kg)  11/03/20 171 lb (77.6 kg)  10/18/20 173 lb (78.5 kg)    General: Well nourished, well developed, in no acute distress Head: Atraumatic, normal size  Eyes: PEERLA, EOMI  Neck: Supple, no JVD Endocrine: No thryomegaly Cardiac: Normal S1, S2; RRR; no murmurs, rubs, or gallops Lungs: Clear to auscultation  bilaterally, no wheezing, rhonchi or rales  Abd: Soft, nontender, no hepatomegaly  Ext: No edema, pulses 2+ Musculoskeletal: No deformities, BUE and BLE strength normal and equal Skin: Warm and dry, no rashes   Neuro: Alert and oriented to person, place, time, and situation, CNII-XII grossly intact, no focal deficits  Psych: Normal mood and affect   ASSESSMENT:   Edward Campos is a 87 y.o. male who presents for the following: 1. Typical atrial flutter (HCC)   2. PAD (peripheral artery disease) (HCC)   3. AAA (abdominal aortic aneurysm) without rupture (HCC)   4. Mixed hyperlipidemia     PLAN:   1. Typical atrial flutter (HCC) -Suspect this was triggered by recent cataract surgery. CHADSVASC=3.  He will continue Eliquis for now.  We will pursue a 7-day Zio patch to exclude any atrial fibrillation.  He will continue metoprolol tartrate 12.5 twice daily.  His echocardiogram was normal.  Eliquis is quite costly.  For now we will continue this.  He has no further recurrence of atrial flutter we may just take him off this.  I suspect his trigger could have been surgery.  2. PAD (peripheral artery disease) (HCC) -Extensive left femorofemoral bypass with iliofemoral arterectomy's as well as right common iliac stent in March 2021.  No symptoms of claudication.  Seems to be doing well.  Remains on Eliquis and statin.  We are holding aspirin for now.  3. AAA (abdominal aortic aneurysm) without rupture (HCC) -AAA noted on recent imaging 4.7 cm.  Continue to monitor this yearly.  4. Mixed hyperlipidemia -Significant PAD.  Most recent LDL cholesterol 50.  Continue Crestor 20 mg daily.     Disposition: Return in about 6 months (around 05/15/2021).  Medication Adjustments/Labs and Tests Ordered: Current medicines are reviewed at length with the patient today.  Concerns regarding medicines are outlined above.  Orders Placed This Encounter  Procedures   LONG TERM MONITOR (3-14 DAYS)   EKG 12-Lead     No orders of the defined types were placed in this encounter.   Patient Instructions  Medication Instructions:  The current medical regimen is effective;  continue present plan  and medications.  *If you need a refill on your cardiac medications before your next appointment, please call your pharmacy*   Testing/Procedures: ZIO XT- Long Term Monitor Instructions  Your physician has requested you wear a ZIO patch monitor for 7 days.  This is a single patch monitor. [employer] supplies one patch monitor per enrollment. Additional stickers are not available. Please do not apply patch if you will be having a Nuclear Stress Test,  Echocardiogram, Cardiac CT, MRI, or Chest Xray during the period you would be wearing the  monitor. The patch cannot be worn during these tests. You cannot remove and re-apply the  ZIO XT patch monitor.  Your ZIO patch monitor will be mailed 3 day USPS to your address on file. It may take 3-5 days  to receive your monitor after you have been enrolled.  Once you have received your monitor, please review the enclosed instructions. Your monitor  has already been registered assigning a specific monitor serial # to you.  Billing and Patient Assistance Program Information  We have supplied [employer] with any of your insurance information on file for billing purposes. [employer] offers a sliding scale Patient Assistance Program for patients that do not have  insurance, or whose insurance does not completely cover the cost of the ZIO monitor.  You must apply for the Patient Assistance Program to qualify for this discounted rate.  To apply, please call [employer] at [phone], select option 4, select option 2, ask to apply for  Patient Assistance Program. Meredeth Ide will ask your household income, and how many people  are in your household. They will quote your out-of-pocket cost based on that information.  [employer] will also be able to set up a 29-month, interest-free payment  plan if needed.  Applying the monitor   Shave hair from upper left chest.  Hold abrader disc by orange tab. Rub abrader in 40 strokes over the upper left chest as  indicated in your monitor instructions.  Clean area with 4 enclosed alcohol pads. Let dry.  Apply patch as indicated in monitor instructions. Patch will be placed under collarbone on left  side of chest with arrow pointing upward.  Rub patch adhesive wings for 2 minutes. Remove white label marked "1". Remove the white  label marked "2". Rub patch adhesive wings for 2 additional minutes.  While looking in a mirror, press and release button in center of patch. A small green light will  flash 3-4 times. This will be your only indicator that the monitor has been turned on.  Do not shower for the first 24 hours. You may shower after the first 24 hours.  Press the button if you feel a symptom. You will hear a small click. Record Date, Time and  Symptom in the Patient Logbook.  When you are ready to remove the patch, follow instructions on the last 2 pages of Patient  Logbook. Stick patch monitor onto the last page of Patient Logbook.  Place Patient Logbook in the blue and white box. Use locking tab on box and tape box closed  securely. The blue and white box has prepaid postage on it. Please place it in the mailbox as  soon as possible. Your physician should have your test results approximately 7 days after the  monitor has been mailed back to [facility].  Call [facility] Customer Care at [phone] if you have questions regarding  your ZIO XT patch monitor. Call them immediately if you see an orange light blinking on  your  monitor.  If your monitor falls off in less than 4 days, contact our Monitor department at [phone].  If your monitor becomes loose or falls off after 4 days call [employer] at [phone] for  suggestions on securing your monitor    Follow-Up: At [facility], you and your health needs are  our priority.  As part of our continuing mission to provide you with exceptional heart care, we have created designated Provider Care Teams.  These Care Teams include your primary Cardiologist (physician) and Advanced Practice Providers (APPs -  Physician Assistants and Nurse Practitioners) who all work together to provide you with the care you need, when you need it.  We recommend signing up for the patient portal called "MyChart".  Sign up information is provided on this After Visit Summary.  MyChart is used to connect with patients for Virtual Visits (Telemedicine).  Patients are able to view lab/test results, encounter notes, upcoming appointments, etc.  Non-urgent messages can be sent to your provider as well.   To learn more about what you can do with MyChart, go to [email or web address].    Your next appointment:   6 month(s)  The format for your next appointment:   In Person  Provider:   Lennie Odor, MD     Time Spent with Patient: I have spent a total of 35 minutes with patient reviewing hospital notes, telemetry, EKGs, labs and examining the patient as well as establishing an assessment and plan that was discussed with the patient.  > 50% of time was spent in direct patient care.  Signed, Lenna Gilford. Flora Lipps, MD, [facility]  [facility]  [address] [place], [place] [address] [phone]  11/15/2020 11:04 AM

## 2020-11-15 ENCOUNTER — Ambulatory Visit (INDEPENDENT_AMBULATORY_CARE_PROVIDER_SITE_OTHER): Payer: Medicare Other

## 2020-11-15 ENCOUNTER — Other Ambulatory Visit: Payer: Self-pay

## 2020-11-15 ENCOUNTER — Ambulatory Visit (INDEPENDENT_AMBULATORY_CARE_PROVIDER_SITE_OTHER): Payer: Medicare Other | Admitting: Cardiovascular Disease

## 2020-11-15 ENCOUNTER — Encounter: Payer: Self-pay | Admitting: Cardiovascular Disease

## 2020-11-15 VITALS — BP 132/74 | HR 54 | Ht 69.0 in | Wt 166.8 lb

## 2020-11-15 DIAGNOSIS — I483 Typical atrial flutter: Secondary | ICD-10-CM

## 2020-11-15 DIAGNOSIS — E782 Mixed hyperlipidemia: Secondary | ICD-10-CM

## 2020-11-15 DIAGNOSIS — I714 Abdominal aortic aneurysm, without rupture, unspecified: Secondary | ICD-10-CM

## 2020-11-15 DIAGNOSIS — I739 Peripheral vascular disease, unspecified: Secondary | ICD-10-CM

## 2020-11-15 NOTE — Progress Notes (Unsigned)
Enrolled patient for a 7 day Zio XT monitor to be mailed to patients home.  

## 2020-11-15 NOTE — Patient Instructions (Signed)
Medication Instructions:  The current medical regimen is effective;  continue present plan and medications.  *If you need a refill on your cardiac medications before your next appointment, please call your pharmacy*   Testing/Procedures:  ZIO XT- Long Term Monitor Instructions  Your physician has requested you wear a ZIO patch monitor for 7 days.  This is a single patch monitor. [employer] supplies one patch monitor per enrollment. Additional stickers are not available. Please do not apply patch if you will be having a Nuclear Stress Test,  Echocardiogram, Cardiac CT, MRI, or Chest Xray during the period you would be wearing the  monitor. The patch cannot be worn during these tests. You cannot remove and re-apply the  ZIO XT patch monitor.  Your ZIO patch monitor will be mailed 3 day USPS to your address on file. It may take 3-5 days  to receive your monitor after you have been enrolled.  Once you have received your monitor, please review the enclosed instructions. Your monitor  has already been registered assigning a specific monitor serial # to you.  Billing and Patient Assistance Program Information  We have supplied [employer] with any of your insurance information on file for billing purposes. [employer] offers a sliding scale Patient Assistance Program for patients that do not have  insurance, or whose insurance does not completely cover the cost of the ZIO monitor.  You must apply for the Patient Assistance Program to qualify for this discounted rate.  To apply, please call [employer] at [phone], select option 4, select option 2, ask to apply for  Patient Assistance Program. [employer] will ask your household income, and how many people  are in your household. They will quote your out-of-pocket cost based on that information.  [employer] will also be able to set up a 12-month, interest-free payment plan if needed.  Applying the monitor   Shave hair from upper left chest.  Hold abrader  disc by orange tab. Rub abrader in 40 strokes over the upper left chest as  indicated in your monitor instructions.  Clean area with 4 enclosed alcohol pads. Let dry.  Apply patch as indicated in monitor instructions. Patch will be placed under collarbone on left  side of chest with arrow pointing upward.  Rub patch adhesive wings for 2 minutes. Remove white label marked "1". Remove the white  label marked "2". Rub patch adhesive wings for 2 additional minutes.  While looking in a mirror, press and release button in center of patch. A small green light will  flash 3-4 times. This will be your only indicator that the monitor has been turned on.  Do not shower for the first 24 hours. You may shower after the first 24 hours.  Press the button if you feel a symptom. You will hear a small click. Record Date, Time and  Symptom in the Patient Logbook.  When you are ready to remove the patch, follow instructions on the last 2 pages of Patient  Logbook. Stick patch monitor onto the last page of Patient Logbook.  Place Patient Logbook in the blue and white box. Use locking tab on box and tape box closed  securely. The blue and white box has prepaid postage on it. Please place it in the mailbox as  soon as possible. Your physician should have your test results approximately 7 days after the  monitor has been mailed back to [employer].  Call [employer] Customer Care at [phone] if you have questions regarding  your ZIO   patch monitor. Call them immediately if you see an orange light blinking on your  monitor.  If your monitor falls off in less than 4 days, contact our Monitor department at [phone].  If your monitor becomes loose or falls off after 4 days call [employer] at [phone] for  suggestions on securing your monitor    Follow-Up: At [facility], you and your health needs are our priority.  As part of our continuing mission to provide you with exceptional heart care, we have  created designated Provider Care Teams.  These Care Teams include your primary Cardiologist (physician) and Advanced Practice Providers (APPs -  Physician Assistants and Nurse Practitioners) who all work together to provide you with the care you need, when you need it.  We recommend signing up for the patient portal called "MyChart".  Sign up information is provided on this After Visit Summary.  MyChart is used to connect with patients for Virtual Visits (Telemedicine).  Patients are able to view lab/test results, encounter notes, upcoming appointments, etc.  Non-urgent messages can be sent to your provider as well.   To learn more about what you can do with MyChart, go to [email or web address].    Your next appointment:   6 month(s)  The format for your next appointment:   In Person  Provider:   Lennie Odor, MD

## 2020-11-24 DIAGNOSIS — I483 Typical atrial flutter: Secondary | ICD-10-CM | POA: Diagnosis not present

## 2020-11-25 ENCOUNTER — Other Ambulatory Visit: Payer: Self-pay

## 2020-11-25 DIAGNOSIS — I739 Peripheral vascular disease, unspecified: Secondary | ICD-10-CM

## 2020-11-25 DIAGNOSIS — I714 Abdominal aortic aneurysm, without rupture, unspecified: Secondary | ICD-10-CM

## 2020-12-05 DIAGNOSIS — I483 Typical atrial flutter: Secondary | ICD-10-CM | POA: Diagnosis not present

## 2020-12-07 ENCOUNTER — Ambulatory Visit (INDEPENDENT_AMBULATORY_CARE_PROVIDER_SITE_OTHER)
Admission: RE | Admit: 2020-12-07 | Discharge: 2020-12-07 | Disposition: A | Discharge status: Home / Self Care | Payer: Medicare Other | Source: Ambulatory Visit | Attending: Vascular Surgery | Admitting: Vascular Surgery

## 2020-12-07 ENCOUNTER — Other Ambulatory Visit: Payer: Self-pay

## 2020-12-07 ENCOUNTER — Ambulatory Visit (HOSPITAL_COMMUNITY)
Admission: RE | Admit: 2020-12-07 | Discharge: 2020-12-07 | Disposition: A | Discharge status: Home / Self Care | Payer: Medicare Other | Source: Ambulatory Visit | Attending: Vascular Surgery | Admitting: Vascular Surgery

## 2020-12-07 ENCOUNTER — Ambulatory Visit (INDEPENDENT_AMBULATORY_CARE_PROVIDER_SITE_OTHER): Payer: Medicare Other | Admitting: Physician Assistant

## 2020-12-07 VITALS — BP 145/82 | HR 49 | Temp 97.9°F | Resp 20 | Ht 69.0 in | Wt 165.1 lb

## 2020-12-07 DIAGNOSIS — I739 Peripheral vascular disease, unspecified: Secondary | ICD-10-CM | POA: Insufficient documentation

## 2020-12-07 DIAGNOSIS — I714 Abdominal aortic aneurysm, without rupture, unspecified: Secondary | ICD-10-CM

## 2020-12-07 NOTE — Progress Notes (Signed)
HISTORY AND PHYSICAL     CC:  follow up. Requesting Provider:  No ref. provider found  HPI: This is a 87 y.o. male who is here today for follow up for AAA and PAD.  Surgical history significant for right common and external iliac artery stenting with right to left femoral to femoral bypass and bilateral iliofemoral endarterectomies by Dr. Darrick Penna on June 08, 2019 and subsequently required I&D of left groin with vac placement.  Patient has some mild claudication symptoms of both calfs after walking a distance or uphill however this does not affect his day-to-day life.  He denies any rest pain or nonhealing wounds of bilateral lower extremities.  He had been on Plavix and aspirin daily however discontinued both since last office visit due to easy bruising and bleeding.  He denies tobacco use.   He is also followed for surveillance of his abdominal aortic aneurysm.  6 months ago the AAA measured 4.6 cm at his greatest diameter based on ultrasound and was basically unchanged.  His ABI's were also unchanged with ABI of 0.73 on the right and 0.94 on the left.  He was scheduled for 6 months f/u.    Since his last visit, he was diagnosed with atrial flutter after eye surgery.  He was placed on Eliquis.   He underwent cardioversion on 11/03/2020 by Dr. Royann Shivers.  He was converted to sinus bradycardia.   The pt returns today for follow up.  He states that he has not had any severe abdominal or back pain.  He states that he does have trouble swallowing his food and sometimes it will get stuck or he has to throw it up.  He does not have any rest pain at night and he does not walk enough to elicit claudication.  He does have what he calls a plantar wart on the right heel that has been present since May.  He has been going to the dermatologist for evaluation.     He states that he was started on Metoprolol for his atrial flutter, but it made him feel really bad and his HR was in the 40's so he discontinued this.   He does not take the asa or Plavix but has been compliant with his Elquis.    The pt is on a statin for cholesterol management.    The pt is not on an aspirin.    Other AC:  Eliquis The pt is not on medication for hypertension.  The pt does not have diabetes. Tobacco hx:  former   Past Medical History:  Diagnosis Date   AAA (abdominal aortic aneurysm) (HCC)    Anxiety    Arthritis    BPH (benign prostatic hyperplasia)    Dizziness    Hyperlipidemia     Past Surgical History:  Procedure Laterality Date   ABDOMINAL AORTOGRAM W/LOWER EXTREMITY N/A 05/06/2019   Procedure: ABDOMINAL AORTOGRAM W/LOWER EXTREMITY;  Surgeon: Cephus Shelling, MD;  Location: [facility];  Service: Cardiovascular;  Laterality: N/A;   APPLICATION OF WOUND VAC  07/06/2019   Procedure: Application Of Wound Vac Left Groin;  Surgeon: Sherren Kerns, MD;  Location: [facility] OR;  Service: Vascular;;   CARDIOVERSION N/A 11/03/2020   Procedure: CARDIOVERSION;  Surgeon: Thurmon Fair, MD;  Location: [facility] ENDOSCOPY;  Service: Cardiovascular;  Laterality: N/A;   ENDARTERECTOMY FEMORAL Bilateral 06/08/2019   Procedure: ENDARTERECTOMY RIGHT COMMON FEMORAL; ENDARTERECTOMY RIGHT EXTERNAL ILIAC; ENDARTERECTOMY LEFT COMMON FEMORAL; ENDARTERECTOMY LEFT EXTERNAL ILIAC;  Surgeon: Fabienne Bruns  E, MD;  Location: [facility] OR;  Service: Vascular;  Laterality: Bilateral;   FEMORAL-FEMORAL BYPASS GRAFT Bilateral 06/08/2019   Procedure: BYPASS GRAFT FEMORAL-FEMORAL ARTERY;  Surgeon: Sherren Kerns, MD;  Location: [facility] OR;  Service: Vascular;  Laterality: Bilateral;   GROIN DEBRIDEMENT Left 07/06/2019   Procedure: INCISION AND DRAINAGE OF LEFT GROIN;  Surgeon: Sherren Kerns, MD;  Location: [facility] OR;  Service: Vascular;  Laterality: Left;   growth removed from chest     HEMORRHOID SURGERY     HERNIA REPAIR  12 yrs ago   left inguinal hernia   INCISE AND DRAIN ABCESS Left 07/06/2019   Incision and drainage left groin placement of VAC  dressing   INGUINAL HERNIA REPAIR  10/16/2010   Procedure: HERNIA REPAIR INGUINAL ADULT;  Surgeon: Dalia Heading;  Location: [facility] ORS;  Service: General;  Laterality: Right;   INSERTION OF ILIAC STENT Right 06/08/2019   Procedure: INSERTION OF RIGHT COMMON ILIAC AND EXTERNAL ILICAC STENT;  Surgeon: Sherren Kerns, MD;  Location: [facility] OR;  Service: Vascular;  Laterality: Right;   PATCH ANGIOPLASTY Right 06/08/2019   Procedure: Patch Angioplasty Right Common Femoral;  Surgeon: Sherren Kerns, MD;  Location: [facility] OR;  Service: Vascular;  Laterality: Right;    Allergies  Allergen Reactions   Penicillins Other (See Comments)    UNSPECIFIED REACTION  Did it involve swelling of the face/tongue/throat, SOB, or low BP? Unknown Did it involve sudden or severe rash/hives, skin peeling, or any reaction on the inside of your mouth or nose? Unknown Did you need to seek medical attention at a hospital or doctor's office? Unknown When did it last happen? Between ages 54-13   If all above answers are "NO", may proceed with cephalosporin use.     Current Outpatient Medications  Medication Sig Dispense Refill   acetaminophen (TYLENOL) 650 MG CR tablet Take 650 mg by mouth 2 (two) times daily as needed for pain.     apixaban (ELIQUIS) 5 MG TABS tablet Take 1 tablet (5 mg total) by mouth 2 (two) times daily. 60 tablet 6   metoprolol tartrate (LOPRESSOR) 25 MG tablet Take 0.5 tablets (12.5 mg total) by mouth 2 (two) times daily. 180 tablet 3   Multiple Vitamins-Minerals (MULTIVITAMIN WITH MINERALS) tablet Take 1 tablet by mouth daily.     rosuvastatin (CRESTOR) 20 MG tablet TAKE 1 TABLET(20 MG) BY MOUTH DAILY 90 tablet 12   Simethicone (GAS RELIEF EXTRA STRENGTH PO) Take 1 tablet by mouth as needed.     No current facility-administered medications for this visit.    Family History  Problem Relation Age of Onset   Stroke Mother    Pseudochol deficiency Neg Hx    Malignant hyperthermia Neg Hx     Hypotension Neg Hx    Anesthesia problems Neg Hx     Social History   Socioeconomic History   Marital status: Divorced    Spouse name: Not on file   Number of children: 3   Years of education: Not on file   Highest education level: Not on file  Occupational History   Not on file  Tobacco Use   Smoking status: Former    Packs/day: 0.25    Years: 70.00    Pack years: 17.50    Types: Cigarettes    Quit date: 05/30/2019    Years since quitting: 1.5   Smokeless tobacco: Never  Vaping Use   Vaping Use: Never used  Substance and Sexual Activity  Alcohol use: Yes    Alcohol/week: 3.0 standard drinks    Types: 3 Shots of liquor per week    Comment: 3 2oz. shots of bourbon/day   Drug use: No   Sexual activity: Not on file  Other Topics Concern   Not on file  Social History Narrative   Not on file   Social Determinants of Health   Financial Resource Strain: Not on file  Food Insecurity: Not on file  Transportation Needs: Not on file  Physical Activity: Not on file  Stress: Not on file  Social Connections: Not on file  Intimate Partner Violence: Not on file     REVIEW OF SYSTEMS:   [X]  denotes positive finding, [ ]  denotes negative finding Cardiac  Comments:  Chest pain or chest pressure:    Shortness of breath upon exertion:    Short of breath when lying flat:    Irregular heart rhythm:        Vascular    Pain in calf, thigh, or hip brought on by ambulation:    Pain in feet at night that wakes you up from your sleep:     Blood clot in your veins:    Leg swelling:         Pulmonary    Oxygen at home:    Productive cough:     Wheezing:         Neurologic    Sudden weakness in arms or legs:     Sudden numbness in arms or legs:     Sudden onset of difficulty speaking or slurred speech:    Temporary loss of vision in one eye:     Problems with dizziness:         Gastrointestinal    Blood in stool:     Vomited blood:         Genitourinary    Burning  when urinating:     Blood in urine:        Psychiatric    Major depression:         Hematologic    Bleeding problems:    Problems with blood clotting too easily:        Skin    Rashes or ulcers: x See HPI      Constitutional    Fever or chills:      PHYSICAL EXAMINATION:  Today's Vitals   12/07/20 1029  BP: (!) 145/82  Pulse: (!) 49  Resp: 20  Temp: 97.9 F (36.6 C)  TempSrc: Temporal  SpO2: 98%  Weight: 165 lb 1.6 oz (74.9 kg)  Height: 5\' 9"  (1.753 m)   Body mass index is 24.38 kg/m.   General:  WDWN in NAD; vital signs documented above Gait: Not observed HENT: WNL, normocephalic Pulmonary: normal non-labored breathing , without wheezing Cardiac: regular/irregular HR, without  Murmur; without carotid bruits Abdomen: soft, NT, no masses; aortic pulse is not palpable Skin: without rashes Vascular Exam/Pulses:  Right Left  Radial 2+ (normal) 2+ (normal)  Femoral 2+ (normal) 2+ (normal)  DP absent 1+ (weak)  PT monophasic monophasic  Peroneal Brisk monophasic Brisk monophasic   Extremities: without ischemic changes, without Gangrene , without cellulitis; with wound on right heel   Musculoskeletal: no muscle wasting or atrophy  Neurologic: A&O X 3;  No focal weakness or paresthesias are detected Psychiatric:  The pt has Normal affect.   Non-Invasive Vascular Imaging:   AAA Arterial duplex on 12/07/2020: Abdominal Aorta Findings:  +-----------+-------+----------+----------+--------+--------+--------+  Location  AP (cm)Trans (cm)PSV (cm/s)WaveformThrombusComments  +-----------+-------+----------+----------+--------+--------+--------+  Proximal   2.10   1.90      78                                  +-----------+-------+----------+----------+--------+--------+--------+  Mid        3.80   3.70      81                                  +-----------+-------+----------+----------+--------+--------+--------+  Distal     4.70   4.50       29                                  +-----------+-------+----------+----------+--------+--------+--------+  RT CIA Prox1.2    1.3       156                                 +-----------+-------+----------+----------+--------+--------+--------+  LT CIA Prox                                           NV        +-----------+-------+----------+----------+--------+--------+--------+   Previous AAA arterial duplex on 06/09/2020: +-----------+-------+----------+----------+--------+--------+--------+  Location   AP (cm)Trans (cm)PSV (cm/s)WaveformThrombusComments  +-----------+-------+----------+----------+--------+--------+--------+  Proximal   2.00   1.91      73                                  +-----------+-------+----------+----------+--------+--------+--------+  Mid        3.76   3.96      48                                  +-----------+-------+----------+----------+--------+--------+--------+  Distal     4.59   4.61      34                                  +-----------+-------+----------+----------+--------+--------+--------+  RT CIA Prox1.2    1.4       201                                 +-----------+-------+----------+----------+--------+--------+--------+  LT CIA Prox1.3    1.6       225                                 +-----------+-------+----------+----------+--------+--------+--------+   ABI/TBI 12/07/2020: Right:  0.79/0/40 (M) great toe pressure:  53 Left:  0.73/0.53 (M) great toe pressure 71  Previous ABI/TBI 06/09/2020: Right:  0.73/0.29 M) great toe pressure:  42 Left:  0.94/0.44 (M) great toe pressure 65   ASSESSMENT/PLAN:: 87 y.o. male here for follow up for AAA and PAD with hx of right common and external iliac artery stenting with right to left femoral to femoral  bypass and bilateral iliofemoral endarterectomies by Dr. Darrick Penna on June 08, 2019 and subsequently required I&D of left groin with vac placement.    AAA -pt AAA essentially unchanged from 6 months ago  PAD -pt does have an ulcer on the heel of the right foot (see picture above).  He does have monophasic doppler flow in the right foot with ABI of 0.7.  Dr. Randie Heinz reviewed arteriogram from 2021 and pt has onve vessel peroneal runoff.  Discussed this with pt and will have him return next week with duplex of his right iliac artery stent as well as RLE arterial duplex and see Dr. Randie Heinz.  He can then further discuss plan to optimize blood flow for healing of his right heel wound. -discussed with pt that he should not have this heel wound worked on until his blood flow is optimized as it could cause further wound issues and put him at risk for amputation.  He expressed understanding.   -pt given list of  primary care providers as he does not have one.  Discussed it would be important for him to have one given his other issues.    -he will f/u with cardiology for results of his recent heart monitor.    Doreatha Massed, [facility] [phone]  Clinic MD:   Randie Heinz

## 2020-12-08 ENCOUNTER — Encounter: Payer: Self-pay | Admitting: Podiatry

## 2020-12-08 ENCOUNTER — Ambulatory Visit (INDEPENDENT_AMBULATORY_CARE_PROVIDER_SITE_OTHER): Payer: Medicare Other | Admitting: Podiatry

## 2020-12-08 ENCOUNTER — Other Ambulatory Visit: Payer: Self-pay

## 2020-12-08 VITALS — BP 135/75 | HR 73 | Temp 98.0°F

## 2020-12-08 DIAGNOSIS — B070 Plantar wart: Secondary | ICD-10-CM | POA: Diagnosis not present

## 2020-12-08 DIAGNOSIS — I739 Peripheral vascular disease, unspecified: Secondary | ICD-10-CM

## 2020-12-08 DIAGNOSIS — I714 Abdominal aortic aneurysm, without rupture, unspecified: Secondary | ICD-10-CM

## 2020-12-08 NOTE — Progress Notes (Signed)
  Subjective:  Patient ID: Edward Campos, male    DOB: 01-24-1933,   MRN: [id number]  Chief Complaint  Patient presents with   Plantar Warts    Right heel plantar wart onset may 2022. Patient reports having treatment in June and July but the area has not gotten better, it only hurts more. Patient stated it hurts the most when pressure is applied to the area.     87 y.o. male presents for right plantar heel lesion that has been present since May 2022. States he has had it frozen twice and still having pain in this area and hurts to walk. Has not tried any other treatments  . Denies any other pedal complaints. Denies n/v/f/c.   Past Medical History:  Diagnosis Date   AAA (abdominal aortic aneurysm) (HCC)    Anxiety    Arthritis    BPH (benign prostatic hyperplasia)    Dizziness    Hyperlipidemia     Objective:  Physical Exam: Vascular: DP/PT pulses 2/4 bilateral. CFT <3 seconds. Normal hair growth on digits. No edema.  Skin. No lacerations or abrasions bilateral feet. Hyperkeratotic lesion noted to right plantar heel. Tenderness to direct palpation and with squeeze. Minimal pin point bleeding noted. Cored area noted.  Musculoskeletal: MMT 5/5 bilateral lower extremities in DF, PF, Inversion and Eversion. Deceased ROM in DF of ankle joint.  Neurological: Sensation intact to light touch.   Assessment:   1. Plantar wart, left foot      Plan:  Patient was evaluated and treated and all questions answered. -Discussed warts as well as porokeratosis with patient and treatment options.  -Hyperkeratotic tissue was debrided with chisel without incident.  -Applied salycylic acid treatment to area with dressing. Advised to remove bandaging tomorrow.  -Encouraged daily moisturizing -Discussed use of pumice stone -Advised good supportive shoes and inserts -Instructed on using OTC Compound W.  -Patient to return to office as needed or sooner if condition worsens.   Louann Sjogren, DPM

## 2020-12-14 ENCOUNTER — Encounter (HOSPITAL_COMMUNITY): Payer: Medicare Other

## 2020-12-14 ENCOUNTER — Ambulatory Visit: Payer: Medicare Other | Admitting: Vascular Surgery

## 2020-12-30 ENCOUNTER — Encounter (HOSPITAL_COMMUNITY): Payer: Self-pay | Admitting: Cardiovascular Disease

## 2021-02-02 IMAGING — CT CT CTA ABD/PEL W/CM AND/OR W/O CM
1 of 4 series · 12 of 32 positions shown, 17 images · IV contrast (APPLIED)
Comparison: MR 01/16/2019

CLINICAL DATA: Abdominal aortic aneurysm noted on MR lumbar spine

EXAM:
CTA ABDOMEN AND PELVIS WITH CONTRAST
TECHNIQUE: Multidetector CT imaging of the abdomen and pelvis was performed
using the standard protocol during bolus administration of
intravenous contrast. Multiplanar reconstructed images and MIPs were
obtained and reviewed to evaluate the vascular anatomy.
CONTRAST:  75mL [id number]-[id number] IOPAMIDOL ([id number]-[id number]) INJECTION 76%

[Series 5: pre stent angio · axial · non-contrast · 0.74mm/px · z∈[-384,-32]mm · 12 of 398 slices shown, 17 images]
[im 23/398  soft-tissue]
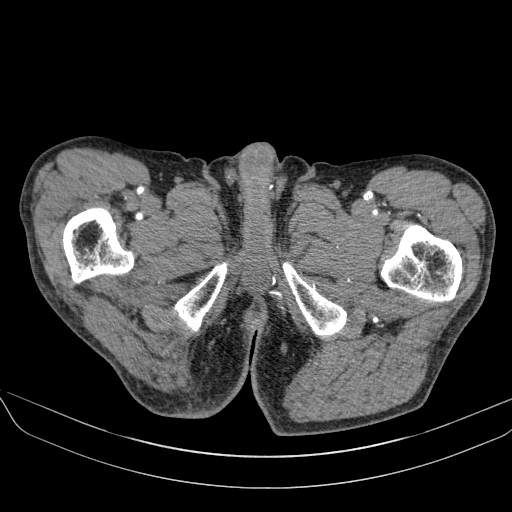
[im 23/398  bone]
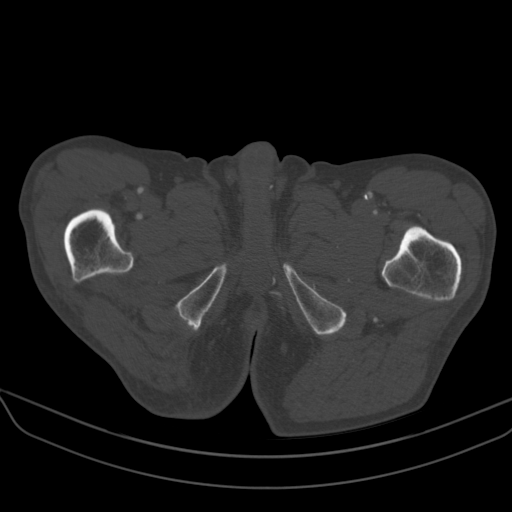
[im 67/398  soft-tissue]
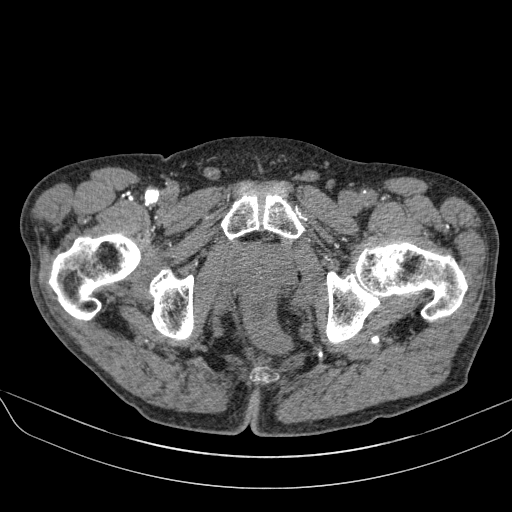
[im 89/398  soft-tissue]
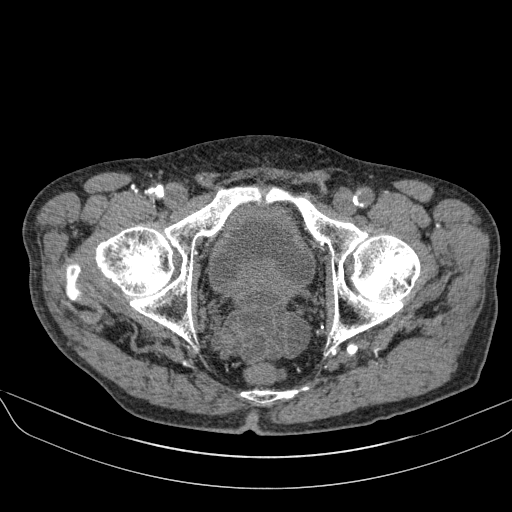
[im 133/398  soft-tissue]
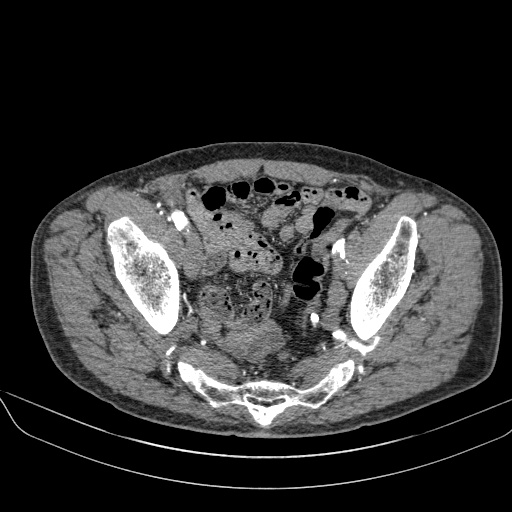
[im 155/398  soft-tissue]
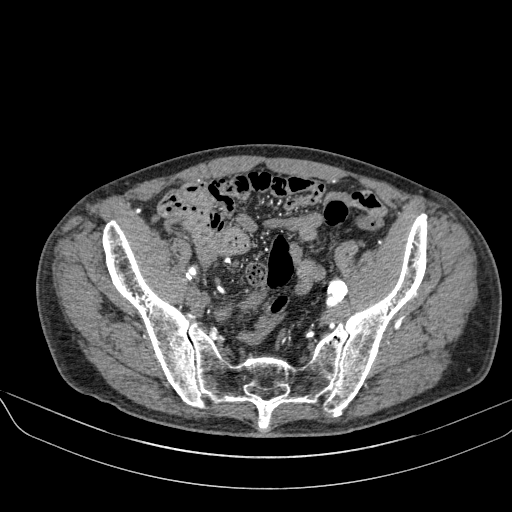
[im 199/398  soft-tissue]
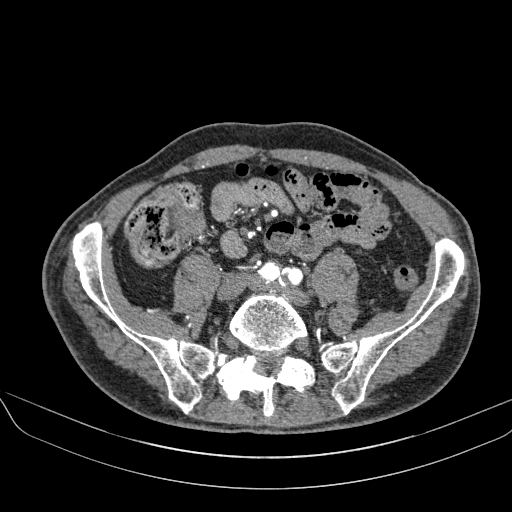
[im 243/398  soft-tissue]
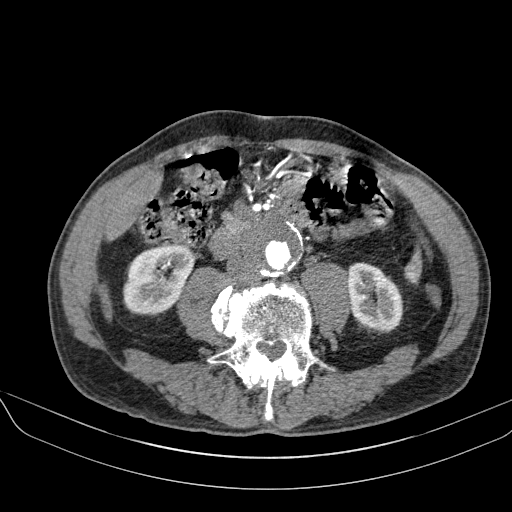
[im 265/398  soft-tissue]
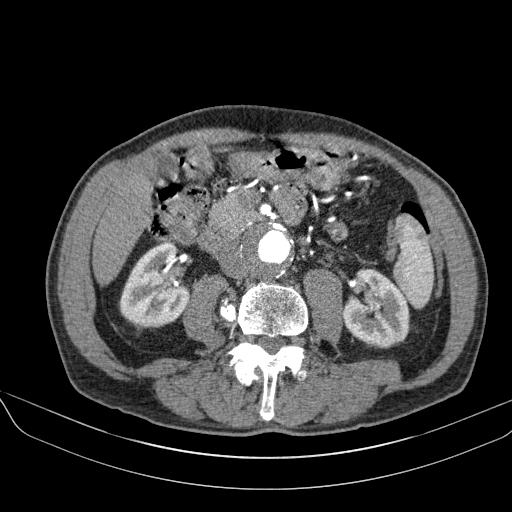
[im 309/398  soft-tissue]
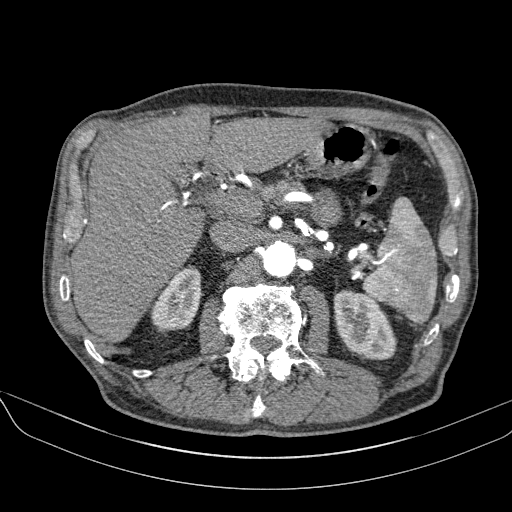
[im 309/398  lung]
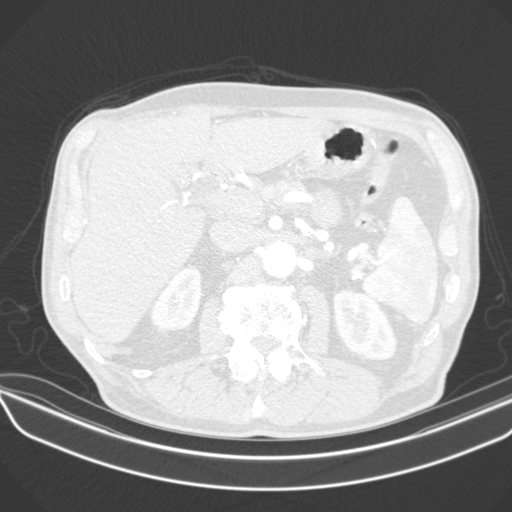
[im 309/398  bone]
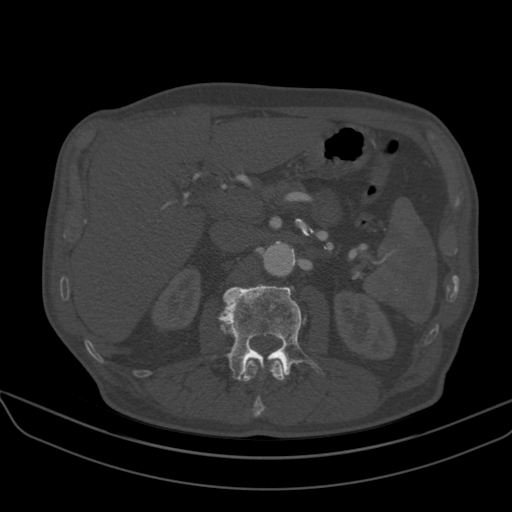
[im 331/398  soft-tissue]
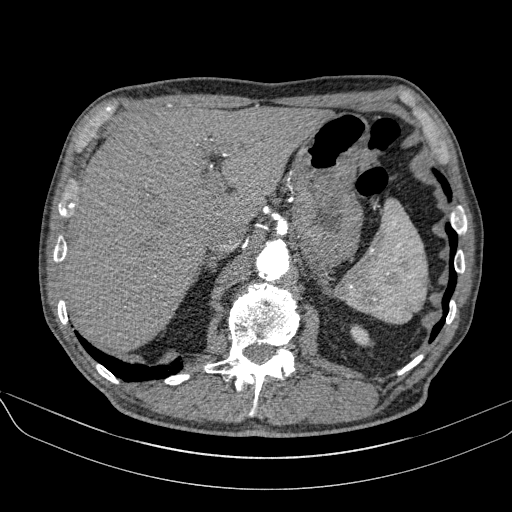
[im 331/398  lung]
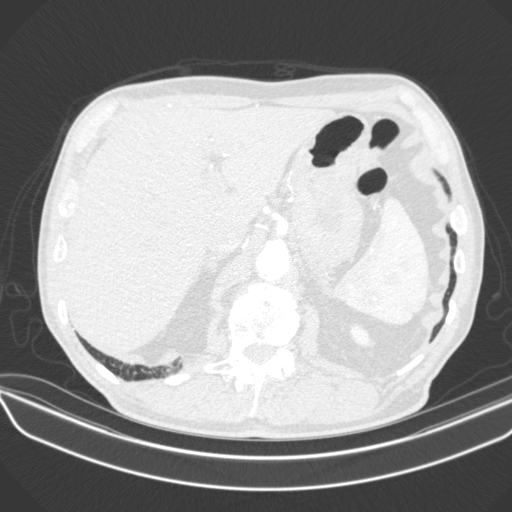
[im 353/398  lung]
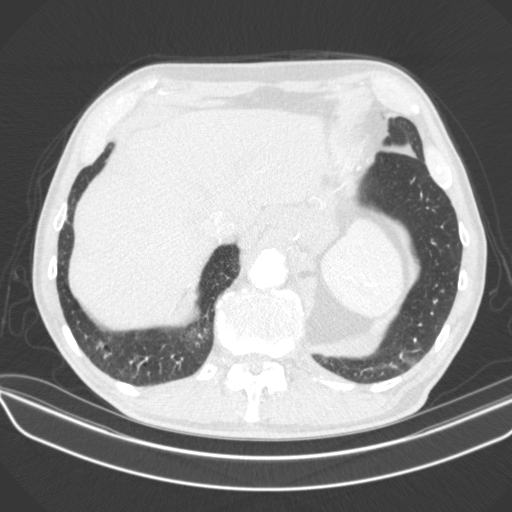
[im 375/398  soft-tissue]
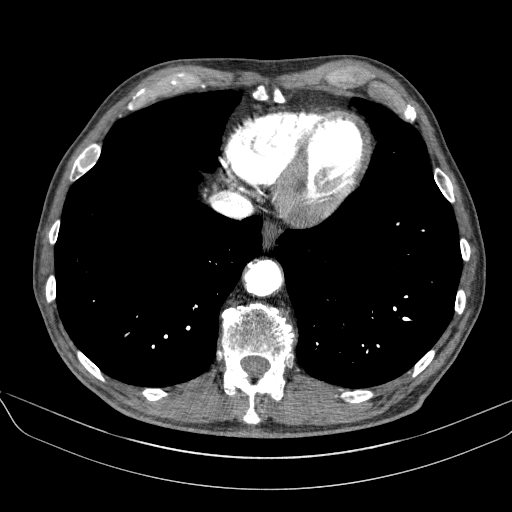
[im 375/398  lung]
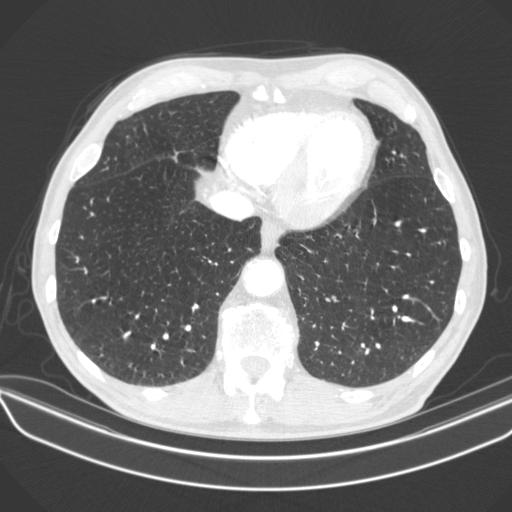

[12 of 32 positions shown; findings below may reference images not displayed]

FINDINGS: VASCULAR

Aorta: Moderate calcified atheromatous plaque in the visualized
distal descending thoracic segment in suprarenal segment of the
aorta. Fusiform infrarenal aneurysm 4.7 x 4.5 cm maximum transverse
dimensions, tapering to normal caliber just above the bifurcation.
Eccentric nonocclusive mural thrombus in the aneurysmal segment. No
dissection or stenosis. No rupture or stigmata of impending rupture.

Celiac: Calcified ostial plaque resulting in short segment stenosis
of at least mild severity, patent distally. Left hepatic artery
arises from the left gastric artery, an anatomic variant.

SMA: Mild scattered calcified atheromatous plaque without high-grade
stenosis. Classic distal branch anatomy.

Renals: Single left, with calcified ostial plaque resulting in short
segment stenosis of at least mild severe, patent distally.

Duplicated right, Co dominant, without significant stenosis.

IMA: Short segment origin occlusion, reconstituted distally by
visceral collaterals.

Inflow: On the right, atheromatous common iliac ectatic up to
cm. Origin occlusion of the internal iliac artery, distal branches
reconstituted. Atheromatous external iliac artery with tandem areas
of complex plaque and thrombus resulting in at least mild stenosis
in the mid and distal segment

On the left, heavy atheromatous plaque through the common iliac
which is markedly tortuous just beyond its origin, ectatic more
distally to a diameter of 1.5 cm. Fusiform aneurysmal dilatation of
the proximal internal iliac up to 1.1 cm. Occlusion of the external
iliac just beyond its origin extending through the common femoral
artery.

Proximal Outflow: Occluded left common femoral artery with
collateral reconstitution of deep femoral branches and proximal SFA.

On the right, eccentric atheromatous plaque in the common femoral
artery with at least moderate stenosis in its midportion. Deep
femoral and superficial femoral arteries are patent proximally.

Veins: Patent hepatic veins, portal vein, SMV, splenic vein,
bilateral renal veins, IVC. No venous pathology identified.

Review of the MIP images confirms the above findings.

NON-VASCULAR

Lower chest: No acute abnormality.

Hepatobiliary: Subcentimeter calcified stones in the dependent
aspect of the nondilated gallbladder. No focal liver lesion or
biliary ductal dilatation.

Pancreas: Unremarkable. No pancreatic ductal dilatation or
surrounding inflammatory changes.

Spleen: Normal in size without focal abnormality.

Adrenals/Urinary Tract: Normal adrenal glands.

Normal kidneys.  No hydronephrosis

Urinary bladder nondistended.

Stomach/Bowel: Stomach is decompressed, unremarkable.

Small bowel is nondistended.

Appendix not discretely identified.

The colon is nondilated with moderate proximal colonic fecal
material, decompressed distally with prominent distal descending
diverticulum.

Lymphatic: No abdominal or pelvic adenopathy.

Reproductive: Prostate enlargement with central coarse
calcifications.

Other: No ascites.  No free air.

Musculoskeletal: Multilevel lumbar spondylitic change. No fracture
or worrisome bone lesion.
IMPRESSION: 1. 4.7 cm infrarenal abdominal aortic aneurysm .
2. Celiac axis ostial stenosis , with widely patent SMA.
3. Left renal artery ostial stenosis of possible hemodynamic
significance.
4. Inferior mesenteric artery Short-segment origin occlusion, patent
distally
5. RIGHT internal iliac artery origin occlusion.
6. Complex plaque and thrombus resulting in stenosis in the mid
RIGHT external iliac artery.
7. Proximal LEFT external iliac artery occlusion extending through
the common femoral artery.
8. Moderate stenosis in the mid RIGHT common femoral artery.

9. Cholelithiasis

## 2021-03-20 ENCOUNTER — Encounter: Payer: Self-pay | Admitting: Podiatry

## 2021-03-20 ENCOUNTER — Ambulatory Visit (INDEPENDENT_AMBULATORY_CARE_PROVIDER_SITE_OTHER): Payer: Medicare Other | Admitting: Podiatry

## 2021-03-20 ENCOUNTER — Other Ambulatory Visit: Payer: Self-pay

## 2021-03-20 DIAGNOSIS — B070 Plantar wart: Secondary | ICD-10-CM | POA: Diagnosis not present

## 2021-03-20 NOTE — Progress Notes (Signed)
°  Subjective:  Patient ID: Edward Campos, male    DOB: June 16, 1932,   MRN: [id number]  Chief Complaint  Patient presents with   Warts    F/U Rt heel wart: pt states wart never went away and hard to walk and painful -Tx: IBU, and OTC compound cream     88 y.o. male presents for follow-up of right plantar heel lesion that has been present since May 2022. Relates continued pain and the wart treatments have not helped and having to limp every time he walks.  . Denies any other pedal complaints. Denies n/v/f/c.   Past Medical History:  Diagnosis Date   AAA (abdominal aortic aneurysm)    Anxiety    Arthritis    BPH (benign prostatic hyperplasia)    Dizziness    Hyperlipidemia     Objective:  Physical Exam: Vascular: DP/PT pulses 2/4 bilateral. CFT <3 seconds. Normal hair growth on digits. No edema.  Skin. No lacerations or abrasions bilateral feet. Hyperkeratotic lesion noted to right plantar heel. Tenderness to direct palpation and with squeeze. Minimal pin point bleeding noted. Cored area noted.  Musculoskeletal: MMT 5/5 bilateral lower extremities in DF, PF, Inversion and Eversion. Deceased ROM in DF of ankle joint.  Neurological: Sensation intact to light touch.   Assessment:   1. Plantar wart of right foot      Plan:  Patient was evaluated and treated and all questions answered. -Discussed warts as well as porokeratosis with patient and treatment options.  -Area anesthetized with 3 cc lidocaine to remove wart with debridement.  -Hyperkeratotic tissue was debrided with chisel down to area of bleeding skin and all areas of wart appeared to be removed.   -Applied cantharone treatment to area with dressing. Advised to remove bandaging tomorrow.  -Encouraged daily moisturizing -Discussed use of pumice stone -Advised good supportive shoes and inserts -Patient to return to office  in 2-3 weeks. Discussed if no improvement may consider surgical removal due to his significant pain from  the lesion.    Lorenda Peck, DPM

## 2021-04-24 ENCOUNTER — Other Ambulatory Visit: Payer: Self-pay

## 2021-04-24 ENCOUNTER — Ambulatory Visit (INDEPENDENT_AMBULATORY_CARE_PROVIDER_SITE_OTHER): Payer: Medicare Other | Admitting: Podiatry

## 2021-04-24 ENCOUNTER — Encounter: Payer: Self-pay | Admitting: Podiatry

## 2021-04-24 DIAGNOSIS — B070 Plantar wart: Secondary | ICD-10-CM | POA: Diagnosis not present

## 2021-04-24 NOTE — Progress Notes (Signed)
°  Subjective:  Patient ID: Edward Campos, male    DOB: 06-08-1932,   MRN: [id number]  Chief Complaint  Patient presents with   Plantar Warts    Right heel plantar wart     88 y.o. male presents for follow-up of right plantar heel lesion that has been present since May 2022. Relates pain has improved and able to walk without padding.  . Denies any other pedal complaints. Denies n/v/f/c.   Past Medical History:  Diagnosis Date   AAA (abdominal aortic aneurysm)    Anxiety    Arthritis    BPH (benign prostatic hyperplasia)    Dizziness    Hyperlipidemia     Objective:  Physical Exam: Vascular: DP/PT pulses 2/4 bilateral. CFT <3 seconds. Normal hair growth on digits. No edema.  Skin. No lacerations or abrasions bilateral feet. Hyperkeratotic lesion noted to right plantar heel. Minimal tenderness and no pinpoint capillary bleeding noted.  Cored area noted.  Musculoskeletal: MMT 5/5 bilateral lower extremities in DF, PF, Inversion and Eversion. Deceased ROM in DF of ankle joint.  Neurological: Sensation intact to light touch.   Assessment:   1. Plantar wart of right foot       Plan:  Patient was evaluated and treated and all questions answered. -Discussed warts as well as porokeratosis with patient and treatment options.  -Hyperkeratotic tissue was debrided with chisel down to area of bleeding skin  -Applied salycylic acid to area with dressing. Advised to remove bandaging tomorrow.  -Encouraged daily moisturizing -Discussed use of pumice stone -Advised good supportive shoes and inserts -Patient to return to office  as needed.    Louann Sjogren, DPM

## 2021-06-24 DIAGNOSIS — Z20822 Contact with and (suspected) exposure to covid-19: Secondary | ICD-10-CM | POA: Diagnosis not present

## 2021-06-28 NOTE — Progress Notes (Signed)
?Cardiology Office Note:   ?Date:  06/30/2021  ?NAME:  Edward Campos    ?MRN: [id number] ?DOB:  July 07, 1932  ? ?PCP:  Patient, No Pcp Per (Inactive)  ?Cardiologist:  Reatha Harps, MD  ?Electrophysiologist:  None  ? ?Referring MD: No ref. provider found  ? ?Chief Complaint  ?Patient presents with  ? Follow-up  ?   ?  ? ?History of Present Illness:   ?Edward Campos is a 88 y.o. male with a hx of PAD, hyperlipidemia, AAA who presents for follow-up of atrial flutter.  He is doing well.  EKG shows sinus rhythm with PACs.  He reports he stopped his Eliquis.  He feels he does not need this.  He felt poorly on it.  We discussed trying Xarelto or Coumadin.  He reports he would like to forego blood thinners at this time.  He is okay to go back on aspirin.  Denies any chest pain or trouble breathing.  Blood pressure is well controlled.  On Lipitor.  LDL cholesterol is at goal.  Overall no recurrence of arrhythmia but not wanting to be on a blood thinner. ? ?Problem List ?1. PAD ?-L fem-fem bypass with iliofemoral endarterectomies 06/08/2019 ?-R common iliac/external iliac stent 06/08/2019 ?2. AAA ?-4.7 cm 06/09/2020 ?3. HLD ?-T chol 105, HDL 43, LDL 50, TG 50 ?4. Atrial flutter, typical 10/11/2020 ?-CHADSVASC=3 (age, PAD) ?-DCCV 11/03/2020 ? ?Past Medical History: ?Past Medical History:  ?Diagnosis Date  ? AAA (abdominal aortic aneurysm) (HCC)   ? Anxiety   ? Arthritis   ? BPH (benign prostatic hyperplasia)   ? Dizziness   ? Hyperlipidemia   ? ? ?Past Surgical History: ?Past Surgical History:  ?Procedure Laterality Date  ? ABDOMINAL AORTOGRAM W/LOWER EXTREMITY N/A 05/06/2019  ? Procedure: ABDOMINAL AORTOGRAM W/LOWER EXTREMITY;  Surgeon: Cephus Shelling, MD;  Location: [facility] INVASIVE CV LAB;  Service: Cardiovascular;  Laterality: N/A;  ? APPLICATION OF WOUND VAC  07/06/2019  ? Procedure: Application Of Wound Vac Left Groin;  Surgeon: Sherren Kerns, MD;  Location: [facility] OR;  Service: Vascular;;  ? CARDIOVERSION N/A 11/03/2020  ?  Procedure: CARDIOVERSION;  Surgeon: Thurmon Fair, MD;  Location: [facility] ENDOSCOPY;  Service: Cardiovascular;  Laterality: N/A;  ? ENDARTERECTOMY FEMORAL Bilateral 06/08/2019  ? Procedure: ENDARTERECTOMY RIGHT COMMON FEMORAL; ENDARTERECTOMY RIGHT EXTERNAL ILIAC; ENDARTERECTOMY LEFT COMMON FEMORAL; ENDARTERECTOMY LEFT EXTERNAL ILIAC;  Surgeon: Sherren Kerns, MD;  Location: [facility] OR;  Service: Vascular;  Laterality: Bilateral;  ? FEMORAL-FEMORAL BYPASS GRAFT Bilateral 06/08/2019  ? Procedure: BYPASS GRAFT FEMORAL-FEMORAL ARTERY;  Surgeon: Sherren Kerns, MD;  Location: [facility] OR;  Service: Vascular;  Laterality: Bilateral;  ? GROIN DEBRIDEMENT Left 07/06/2019  ? Procedure: INCISION AND DRAINAGE OF LEFT GROIN;  Surgeon: Sherren Kerns, MD;  Location: [facility] OR;  Service: Vascular;  Laterality: Left;  ? growth removed from chest    ? HEMORRHOID SURGERY    ? HERNIA REPAIR  12 yrs ago  ? left inguinal hernia  ? INCISE AND DRAIN ABCESS Left 07/06/2019  ? Incision and drainage left groin placement of VAC dressing  ? INGUINAL HERNIA REPAIR  10/16/2010  ? Procedure: HERNIA REPAIR INGUINAL ADULT;  Surgeon: Dalia Heading;  Location: [facility] ORS;  Service: General;  Laterality: Right;  ? INSERTION OF ILIAC STENT Right 06/08/2019  ? Procedure: INSERTION OF RIGHT COMMON ILIAC AND EXTERNAL ILICAC STENT;  Surgeon: Sherren Kerns, MD;  Location: [facility] OR;  Service: Vascular;  Laterality: Right;  ? PATCH ANGIOPLASTY Right 06/08/2019  ?  Procedure: Patch Angioplasty Right Common Femoral;  Surgeon: Sherren KernsFields, Charles E, MD;  [facility];  Service: Vascular;  Laterality: Right;  ? ? ?Current Medications: ?Current Meds  ?Medication Sig  ? acetaminophen (TYLENOL) 650 MG CR tablet Take 650 mg by mouth 2 (two) times daily as needed for pain.  ? aspirin EC 81 MG tablet Take 1 tablet (81 mg total) by mouth daily. Swallow whole.  ? Multiple Vitamins-Minerals (MULTIVITAMIN WITH MINERALS) tablet Take 1 tablet by mouth daily.  ? rosuvastatin (CRESTOR) 20 MG  tablet TAKE 1 TABLET(20 MG) BY MOUTH DAILY  ? Simethicone (GAS RELIEF EXTRA STRENGTH PO) Take 1 tablet by mouth as needed.  ?  ? ?Allergies:    ?Penicillins  ? ?Social History: ?Social History  ? ?Socioeconomic History  ? Marital status: Divorced  ?  Spouse name: Not on file  ? Number of children: 3  ? Years of education: Not on file  ? Highest education level: Not on file  ?Occupational History  ? Not on file  ?Tobacco Use  ? Smoking status: Former  ?  Packs/day: 0.25  ?  Years: 70.00  ?  Pack years: 17.50  ?  Types: Cigarettes  ?  Quit date: 05/30/2019  ?  Years since quitting: 2.0  ? Smokeless tobacco: Never  ?Vaping Use  ? Vaping Use: Never used  ?Substance and Sexual Activity  ? Alcohol use: Yes  ?  Alcohol/week: 3.0 standard drinks  ?  Types: 3 Shots of liquor per week  ?  Comment: 3 2oz. shots of bourbon/day  ? Drug use: No  ? Sexual activity: Not on file  ?Other Topics Concern  ? Not on file  ?Social History Narrative  ? Not on file  ? ?Social Determinants of Health  ? ?Financial Resource Strain: Not on file  ?Food Insecurity: Not on file  ?Transportation Needs: Not on file  ?Physical Activity: Not on file  ?Stress: Not on file  ?Social Connections: Not on file  ?  ? ?Family History: ?The patient's family history includes Stroke in his mother. There is no history of Pseudochol deficiency, Malignant hyperthermia, Hypotension, or Anesthesia problems. ? ?ROS:   ?All other ROS reviewed and negative. Pertinent positives noted in the HPI.    ? ?EKGs/Labs/Other Studies Reviewed:   ?The following studies were personally reviewed by me today: ? ?EKG:  EKG is ordered today.  The ekg ordered today demonstrates sinus rhythm heart rate 60, PAC noted, and was personally reviewed by me.  ? ?Zio 11/15/2020 ?Impression: ?1. Brief atrial tachycardia episodes detected (7 episodes in 7 days; longest 14.9 seconds).  ?2. Frequent PVCs (10.7% burden).  ?3. No atrial fibrillation or flutter detected.  ? ?Recent Labs: ?10/27/2020: BUN  17; Creatinine, Ser 0.93; Hemoglobin 13.9; Platelets 208; Potassium 4.7; Sodium 138; TSH 4.520  ? ?Recent Lipid Panel ?   ?Component Value Date/Time  ? CHOL 105 10/27/2020 1038  ? TRIG 50 10/27/2020 1038  ? HDL 43 10/27/2020 1038  ? CHOLHDL 2.4 10/27/2020 1038  ? LDLCALC 50 10/27/2020 1038  ? ? ?Physical Exam:   ?VS:  BP 126/64   Pulse 69   Ht 5\' 10"  (1.778 m)   Wt 171 lb 6.4 oz (77.7 kg)   SpO2 97%   BMI 24.59 kg/m?    ?Wt Readings from Last 3 Encounters:  ?06/30/21 171 lb 6.4 oz (77.7 kg)  ?12/07/20 165 lb 1.6 oz (74.9 kg)  ?11/15/20 166 lb 12.8 oz (75.7 kg)  ?  ?  General: Well nourished, well developed, in no acute distress ?Head: Atraumatic, normal size  ?Eyes: PEERLA, EOMI  ?Neck: Supple, no JVD ?Endocrine: No thryomegaly ?Cardiac: Normal S1, S2; irregular rhythm ?Lungs: Clear to auscultation bilaterally, no wheezing, rhonchi or rales  ?Abd: Soft, nontender, no hepatomegaly  ?Ext: No edema, pulses 2+ ?Musculoskeletal: No deformities, BUE and BLE strength normal and equal ?Skin: Warm and dry, no rashes   ?Neuro: Alert and oriented to person, place, time, and situation, CNII-XII grossly intact, no focal deficits  ?Psych: Normal mood and affect  ? ?ASSESSMENT:   ?Edward Campos is a 88 y.o. male who presents for the following: ?1. Typical atrial flutter (HCC)   ?2. PAD (peripheral artery disease) (HCC)   ?3. Infrarenal abdominal aortic aneurysm (AAA) without rupture (HCC)   ?4. Mixed hyperlipidemia   ? ? ?PLAN:   ?1. Typical atrial flutter (HCC) ?-History of atrial flutter in August 2022.  EKG shows she is maintaining sinus rhythm.  It is recommended he be on Eliquis but he reports he felt poorly on this.  We discussed trying Xarelto.  He wishes to forego blood thinners at this time.  He understands the risk of stroke.  I recommend he at least take an aspirin given his PAD.  He is okay to do this. ? ?2. PAD (peripheral artery disease) (HCC) ?-History of peripheral intervention in 2021.  No symptoms.  Continue  aspirin and cholesterol-lowering agents.  LDL at goal. ? ?3. Infrarenal abdominal aortic aneurysm (AAA) without rupture (HCC) ?-AAA measures 47 mm.  This will be followed yearly by vascular surgery. ? ?4. Mixed hy

## 2021-06-30 ENCOUNTER — Encounter: Payer: Self-pay | Admitting: Cardiovascular Disease

## 2021-06-30 ENCOUNTER — Ambulatory Visit (INDEPENDENT_AMBULATORY_CARE_PROVIDER_SITE_OTHER): Payer: Medicare Other | Admitting: Cardiovascular Disease

## 2021-06-30 VITALS — BP 126/64 | HR 69 | Ht 70.0 in | Wt 171.4 lb

## 2021-06-30 DIAGNOSIS — I483 Typical atrial flutter: Secondary | ICD-10-CM

## 2021-06-30 DIAGNOSIS — I7143 Infrarenal abdominal aortic aneurysm, without rupture: Secondary | ICD-10-CM | POA: Diagnosis not present

## 2021-06-30 DIAGNOSIS — E782 Mixed hyperlipidemia: Secondary | ICD-10-CM | POA: Diagnosis not present

## 2021-06-30 DIAGNOSIS — I739 Peripheral vascular disease, unspecified: Secondary | ICD-10-CM | POA: Diagnosis not present

## 2021-06-30 MED ORDER — ASPIRIN EC 81 MG PO TBEC: 81.0000 mg | DELAYED_RELEASE_TABLET | Freq: Every day | ORAL | 3 refills | Status: DC

## 2021-06-30 NOTE — Patient Instructions (Signed)
Medication Instructions:  ?The current medical regimen is effective;  continue present plan and medications. ? ?*If you need a refill on your cardiac medications before your next appointment, please call your pharmacy* ? ? ?Follow-Up: ?At [facility], you and your health needs are our priority.  As part of our continuing mission to provide you with exceptional heart care, we have created designated Provider Care Teams.  These Care Teams include your primary Cardiologist (physician) and Advanced Practice Providers (APPs -  Physician Assistants and Nurse Practitioners) who all work together to provide you with the care you need, when you need it. ? ?We recommend signing up for the patient portal called "MyChart".  Sign up information is provided on this After Visit Summary.  MyChart is used to connect with patients for Virtual Visits (Telemedicine).  Patients are able to view lab/test results, encounter notes, upcoming appointments, etc.  Non-urgent messages can be sent to your provider as well.   ?To learn more about what you can do with MyChart, go to [email or web address].   ? ?Your next appointment:   ?12 month(s) ? ?The format for your next appointment:   ?In Person ? ?Provider:   ?Independent Hill T O'Neal, MD   ? ?Important Information About Sugar ? ? ? ? ? ? ?

## 2021-07-04 NOTE — Addendum Note (Signed)
Addended by: Orlene Och on: 07/04/2021 10:22 AM ? ? Modules accepted: Orders ? ?

## 2021-07-10 DIAGNOSIS — Z20822 Contact with and (suspected) exposure to covid-19: Secondary | ICD-10-CM | POA: Diagnosis not present

## 2022-06-21 ENCOUNTER — Other Ambulatory Visit: Payer: Self-pay | Admitting: Vascular Surgery

## 2022-07-09 ENCOUNTER — Other Ambulatory Visit: Payer: Self-pay | Admitting: Vascular Surgery

## 2022-08-15 ENCOUNTER — Other Ambulatory Visit: Payer: Self-pay

## 2022-08-15 ENCOUNTER — Other Ambulatory Visit (HOSPITAL_COMMUNITY): Payer: Self-pay | Admitting: Vascular Surgery

## 2022-08-15 ENCOUNTER — Ambulatory Visit (INDEPENDENT_AMBULATORY_CARE_PROVIDER_SITE_OTHER): Payer: Medicare Other | Admitting: Vascular Surgery

## 2022-08-15 ENCOUNTER — Ambulatory Visit (INDEPENDENT_AMBULATORY_CARE_PROVIDER_SITE_OTHER)
Admission: RE | Admit: 2022-08-15 | Discharge: 2022-08-15 | Disposition: A | Discharge status: Home / Self Care | Payer: Medicare Other | Source: Ambulatory Visit | Attending: Vascular Surgery | Admitting: Vascular Surgery

## 2022-08-15 ENCOUNTER — Encounter (HOSPITAL_COMMUNITY): Payer: Self-pay | Admitting: Vascular Surgery

## 2022-08-15 ENCOUNTER — Other Ambulatory Visit: Payer: Self-pay | Admitting: Vascular Surgery

## 2022-08-15 ENCOUNTER — Encounter: Payer: Self-pay | Admitting: Vascular Surgery

## 2022-08-15 VITALS — BP 137/82 | HR 57 | Temp 97.9°F | Resp 20 | Ht 70.0 in | Wt 171.0 lb

## 2022-08-15 DIAGNOSIS — C3490 Malignant neoplasm of unspecified part of unspecified bronchus or lung: Secondary | ICD-10-CM | POA: Diagnosis not present

## 2022-08-15 DIAGNOSIS — I739 Peripheral vascular disease, unspecified: Secondary | ICD-10-CM

## 2022-08-15 DIAGNOSIS — I714 Abdominal aortic aneurysm, without rupture, unspecified: Secondary | ICD-10-CM

## 2022-08-15 DIAGNOSIS — J432 Centrilobular emphysema: Secondary | ICD-10-CM | POA: Diagnosis not present

## 2022-08-15 DIAGNOSIS — E785 Hyperlipidemia, unspecified: Secondary | ICD-10-CM | POA: Diagnosis present

## 2022-08-15 DIAGNOSIS — Z7982 Long term (current) use of aspirin: Secondary | ICD-10-CM | POA: Diagnosis not present

## 2022-08-15 DIAGNOSIS — T827XXA Infection and inflammatory reaction due to other cardiac and vascular devices, implants and grafts, initial encounter: Secondary | ICD-10-CM

## 2022-08-15 DIAGNOSIS — W228XXA Striking against or struck by other objects, initial encounter: Secondary | ICD-10-CM | POA: Diagnosis not present

## 2022-08-15 DIAGNOSIS — R918 Other nonspecific abnormal finding of lung field: Secondary | ICD-10-CM | POA: Diagnosis not present

## 2022-08-15 DIAGNOSIS — Z823 Family history of stroke: Secondary | ICD-10-CM | POA: Diagnosis not present

## 2022-08-15 DIAGNOSIS — Y832 Surgical operation with anastomosis, bypass or graft as the cause of abnormal reaction of the patient, or of later complication, without mention of misadventure at the time of the procedure: Secondary | ICD-10-CM | POA: Diagnosis present

## 2022-08-15 DIAGNOSIS — Y92230 Patient room in hospital as the place of occurrence of the external cause: Secondary | ICD-10-CM | POA: Diagnosis not present

## 2022-08-15 DIAGNOSIS — D649 Anemia, unspecified: Secondary | ICD-10-CM | POA: Diagnosis not present

## 2022-08-15 DIAGNOSIS — I7143 Infrarenal abdominal aortic aneurysm, without rupture: Secondary | ICD-10-CM

## 2022-08-15 DIAGNOSIS — Z87891 Personal history of nicotine dependence: Secondary | ICD-10-CM | POA: Diagnosis not present

## 2022-08-15 DIAGNOSIS — Z79899 Other long term (current) drug therapy: Secondary | ICD-10-CM | POA: Diagnosis not present

## 2022-08-15 DIAGNOSIS — I10 Essential (primary) hypertension: Secondary | ICD-10-CM | POA: Diagnosis present

## 2022-08-15 DIAGNOSIS — S99922A Unspecified injury of left foot, initial encounter: Secondary | ICD-10-CM | POA: Diagnosis not present

## 2022-08-15 DIAGNOSIS — N400 Enlarged prostate without lower urinary tract symptoms: Secondary | ICD-10-CM | POA: Diagnosis present

## 2022-08-15 DIAGNOSIS — Z880 Allergy status to penicillin: Secondary | ICD-10-CM | POA: Diagnosis not present

## 2022-08-15 DIAGNOSIS — C3432 Malignant neoplasm of lower lobe, left bronchus or lung: Secondary | ICD-10-CM | POA: Diagnosis not present

## 2022-08-15 DIAGNOSIS — M1990 Unspecified osteoarthritis, unspecified site: Secondary | ICD-10-CM | POA: Diagnosis present

## 2022-08-15 DIAGNOSIS — J189 Pneumonia, unspecified organism: Secondary | ICD-10-CM | POA: Diagnosis not present

## 2022-08-15 DIAGNOSIS — J90 Pleural effusion, not elsewhere classified: Secondary | ICD-10-CM | POA: Diagnosis not present

## 2022-08-15 DIAGNOSIS — I998 Other disorder of circulatory system: Secondary | ICD-10-CM

## 2022-08-15 DIAGNOSIS — T8149XA Infection following a procedure, other surgical site, initial encounter: Secondary | ICD-10-CM | POA: Diagnosis not present

## 2022-08-15 DIAGNOSIS — F419 Anxiety disorder, unspecified: Secondary | ICD-10-CM | POA: Diagnosis not present

## 2022-08-15 DIAGNOSIS — R846 Abnormal cytological findings in specimens from respiratory organs and thorax: Secondary | ICD-10-CM | POA: Diagnosis not present

## 2022-08-15 LAB — VAS US ABI WITH/WO TBI
Left ABI: 0.83
Right ABI: 0.82

## 2022-08-15 NOTE — H&P (View-Only) (Signed)
Patient ID: Edward Campos, male   DOB: Apr 18, 1932, 89 y.o.   MRN: [id number]  Reason for Consult: Follow-up   Referred by No ref. provider found  Subjective:     HPI:  Edward Campos is a 89 y.o. male with a history of a right common and external iliac artery stenting with right to left femorofemoral bypass 3 years ago.  At last visit he was having mild claudication bilaterally.  He states that he had a lump in his right groin now for about 3 months.  He denies any fevers or chills.  He also has a known abdominal aortic aneurysm which has been followed in our office.  No new back or abdominal pain.    Past Medical History:  Diagnosis Date   AAA (abdominal aortic aneurysm) (HCC)    Anxiety    Arthritis    BPH (benign prostatic hyperplasia)    Dizziness    Hyperlipidemia    Family History  Problem Relation Age of Onset   Stroke Mother    Pseudochol deficiency Neg Hx    Malignant hyperthermia Neg Hx    Hypotension Neg Hx    Anesthesia problems Neg Hx    Past Surgical History:  Procedure Laterality Date   ABDOMINAL AORTOGRAM W/LOWER EXTREMITY N/A 05/06/2019   Procedure: ABDOMINAL AORTOGRAM W/LOWER EXTREMITY;  Surgeon: Cephus Shelling, MD;  [facility];  Service: Cardiovascular;  Laterality: N/A;   APPLICATION OF WOUND VAC  07/06/2019   Procedure: Application Of Wound Vac Left Groin;  Surgeon: Sherren Kerns, MD;  Location: [facility] OR;  Service: Vascular;;   CARDIOVERSION N/A 11/03/2020   Procedure: CARDIOVERSION;  Surgeon: Thurmon Fair, MD;  [facility]: [facility];  Service: Cardiovascular;  Laterality: N/A;   ENDARTERECTOMY FEMORAL Bilateral 06/08/2019   Procedure: ENDARTERECTOMY RIGHT COMMON FEMORAL; ENDARTERECTOMY RIGHT EXTERNAL ILIAC; ENDARTERECTOMY LEFT COMMON FEMORAL; ENDARTERECTOMY LEFT EXTERNAL ILIAC;  Surgeon: Sherren Kerns, MD;  Location: [facility] OR;  Service: Vascular;  Laterality: Bilateral;   FEMORAL-FEMORAL BYPASS GRAFT Bilateral 06/08/2019    Procedure: BYPASS GRAFT FEMORAL-FEMORAL ARTERY;  Surgeon: Sherren Kerns, MD;  Location: [facility] OR;  Service: Vascular;  Laterality: Bilateral;   GROIN DEBRIDEMENT Left 07/06/2019   Procedure: INCISION AND DRAINAGE OF LEFT GROIN;  Surgeon: Sherren Kerns, MD;  Location: [facility] OR;  Service: Vascular;  Laterality: Left;   growth removed from chest     HEMORRHOID SURGERY     HERNIA REPAIR  12 yrs ago   left inguinal hernia   INCISE AND DRAIN ABCESS Left 07/06/2019   Incision and drainage left groin placement of VAC dressing   INGUINAL HERNIA REPAIR  10/16/2010   Procedure: HERNIA REPAIR INGUINAL ADULT;  Surgeon: Dalia Heading;  Location: [facility] ORS;  Service: General;  Laterality: Right;   INSERTION OF ILIAC STENT Right 06/08/2019   Procedure: INSERTION OF RIGHT COMMON ILIAC AND EXTERNAL ILICAC STENT;  Surgeon: Sherren Kerns, MD;  Location: [facility] OR;  Service: Vascular;  Laterality: Right;   PATCH ANGIOPLASTY Right 06/08/2019   Procedure: Patch Angioplasty Right Common Femoral;  Surgeon: Sherren Kerns, MD;  Location: [facility] OR;  Service: Vascular;  Laterality: Right;    Short Social History:  Social History   Tobacco Use   Smoking status: Former    Packs/day: 0.25    Years: 70.00    Additional pack years: 0.00    Total pack years: 17.50    Types: Cigarettes    Quit date: 05/30/2019  Years since quitting: 3.2   Smokeless tobacco: Never  Substance Use Topics   Alcohol use: Yes    Alcohol/week: 3.0 standard drinks of alcohol    Types: 3 Shots of liquor per week    Comment: 3 2oz. shots of bourbon/day    Allergies  Allergen Reactions   Penicillins Other (See Comments)    UNSPECIFIED REACTION  Did it involve swelling of the face/tongue/throat, SOB, or low BP? Unknown Did it involve sudden or severe rash/hives, skin peeling, or any reaction on the inside of your mouth or nose? Unknown Did you need to seek medical attention at a hospital or doctor's office? Unknown When did it last happen?  Between ages 45-13   If all above answers are "NO", may proceed with cephalosporin use.     Current Outpatient Medications  Medication Sig Dispense Refill   acetaminophen (TYLENOL) 650 MG CR tablet Take 650 mg by mouth 2 (two) times daily as needed for pain.     aspirin EC 81 MG tablet Take 1 tablet (81 mg total) by mouth daily. Swallow whole. 90 tablet 3   Multiple Vitamins-Minerals (MULTIVITAMIN WITH MINERALS) tablet Take 1 tablet by mouth daily.     rosuvastatin (CRESTOR) 20 MG tablet TAKE 1 TABLET(20 MG) BY MOUTH DAILY 90 tablet 12   Simethicone (GAS RELIEF EXTRA STRENGTH PO) Take 1 tablet by mouth as needed.     No current facility-administered medications for this visit.    Review of Systems  Constitutional:  Constitutional negative. HENT: HENT negative.  Eyes: Eyes negative.  Respiratory: Respiratory negative.  Cardiovascular: Positive for claudication.  GI: Gastrointestinal negative.  Musculoskeletal:       Lump in right groin Skin: Skin negative.  Neurological: Neurological negative. Hematologic: Hematologic/lymphatic negative.  Psychiatric: Psychiatric negative.        Objective:  Objective   Vitals:   08/15/22 1017  BP: 137/82  Pulse: (!) 57  Resp: 20  Temp: 97.9 F (36.6 C)  SpO2: 96%     Physical Exam HENT:     Head: Normocephalic.     Nose: Nose normal.  Eyes:     Pupils: Pupils are equal, round, and reactive to light.  Cardiovascular:     Rate and Rhythm: Normal rate.     Pulses:          Femoral pulses are 2+ on the right side and 2+ on the left side. Pulmonary:     Effort: Pulmonary effort is normal.  Abdominal:     General: Abdomen is flat.  Musculoskeletal:     Right lower leg: No edema.     Left lower leg: No edema.  Skin:    General: Skin is warm.     Capillary Refill: Capillary refill takes less than 2 seconds.  Neurological:     General: No focal deficit present.     Mental Status: He is alert.  Psychiatric:        Mood and  Affect: Mood normal.        Thought Content: Thought content normal.        Judgment: Judgment normal.     Data: ABI Findings:  +---------+------------------+-----+----------+--------+  Right   Rt Pressure (mmHg)IndexWaveform  Comment   +---------+------------------+-----+----------+--------+  Brachial 168                                        +---------+------------------+-----+----------+--------+  PTA     137               0.82 monophasic          +---------+------------------+-----+----------+--------+  DP      122               0.73 monophasic          +---------+------------------+-----+----------+--------+  Great Toe76                0.45 Abnormal            +---------+------------------+-----+----------+--------+   +---------+------------------+-----+----------+-------+  Left    Lt Pressure (mmHg)IndexWaveform  Comment  +---------+------------------+-----+----------+-------+  Brachial 157                                       +---------+------------------+-----+----------+-------+  PTA     137               0.82 monophasic         +---------+------------------+-----+----------+-------+  DP      139               0.83 monophasic         +---------+------------------+-----+----------+-------+  Augusto Garbe               0.69 Abnormal           +---------+------------------+-----+----------+-------+   +-------+-----------+-----------+------------+------------+  ABI/TBIToday's ABIToday's TBIPrevious ABIPrevious TBI  +-------+-----------+-----------+------------+------------+  Right 0.82       0.45       0.79        0.40          +-------+-----------+-----------+------------+------------+  Left  0.83       0.69       0.73        0.53          +-------+-----------+-----------+------------+------------+      Bilateral TBIs appear essentially unchanged compared to prior study on  12/07/20.     Summary:  Right: Resting right ankle-brachial index indicates mild right lower  extremity arterial disease. The right toe-brachial index is abnormal.   Left: Resting left ankle-brachial index indicates mild left lower  extremity arterial disease. The left toe-brachial index is abnormal.  .  Abdominal Aorta Findings:  +-----------+-------+----------+----------+--------+--------+--------+  Location  AP (cm)Trans (cm)PSV (cm/s)WaveformThrombusComments  +-----------+-------+----------+----------+--------+--------+--------+  Proximal  4.93   4.99      63                                  +-----------+-------+----------+----------+--------+--------+--------+  Mid       5.11   5.93      53                                  +-----------+-------+----------+----------+--------+--------+--------+  Distal    5.24   6.63      38                                  +-----------+-------+----------+----------+--------+--------+--------+  RT CIA Prox1.1    1.5       141                                 +-----------+-------+----------+----------+--------+--------+--------+  RT EIA Prox                 195                                 +-----------+-------+----------+----------+--------+--------+--------+  LT CIA Prox                                           NV        +-----------+-------+----------+----------+--------+--------+--------+         Summary:  Abdominal Aorta: There is evidence of abnormal dilatation of the distal  Abdominal aorta. The largest aortic measurement is 6.6 cm. The largest  aortic diameter has increased compared to prior exam. Previous diameter  measurement was 4.7 cm obtained on  12/07/20.         Assessment/Plan:    89 year old male with history of right common and external iliac artery stenting and right femoral to left femoral bypass.  He has a new knot over the right side of the femorofemoral bypass and evaluated  with ultrasound today and there is clear fluid collection around the bypass although the anastomosis all appear intact.  He also now has a 6.6 cm abdominal aortic aneurysm which she is reasonably concerned about.  Given the concern for infection of the femorofemoral graft we are going to plan drainage in the OR tomorrow and we will likely place him on antibiotics with wound VAC as long as the graft does not appear to have any disruption of the anastomoses we can attempt salvage of the graft.  Will need CT angio of abdomen pelvis to follow-up aneurysm and possibly plan aorto Uni iliac repair if indeed it is greater than 5.5 cm but certainly he needs to get over any infectious etiologies prior.  All questions answered today we will plan for surgical intervention tomorrow in the OR.     [name] MD [facility]

## 2022-08-15 NOTE — Progress Notes (Signed)
Anesthesia Chart Review: Edward Campos  Case: [id number] Date/Time: 08/16/22 0715   Procedures:      REVISION OF FEMORAL-FEMORAL ARTERY BYPASS GRAFT (Bilateral)     APPLICATION OF WOUND VAC   Anesthesia type: General   Pre-op diagnosis: Infection of bypass graft   [facility]OM 11 / [facility]   Surgeons: [name], MD       DISCUSSION: Patient is an 89 year old male scheduled for the above procedure. He had VVS follow-up on 08/15/22. He reported a lump in his right groin for about 3 months. US showed a fluid collection around the bypass. Anastomosis appeared to be intact. AAA had also grown to 6.6 cm. Given concern for infection of this FFBG, recommendation for OR on 08/16/22 for anticipated I&D, wound VAC and IV antibiotics. If there is no disruption of the graft then he will attempt to salvage it. Will need CTA abd/pelvis to further evaluation AAA and will likely need this addressed in the near future, but needs to take care of any infectious etiologies first   History includes former smoker (quit 05/30/19), HLD, AAA (6.6 cm 08/15/22 [place]), aflutter (s/p DCCV 11/03/20), PAD (right CFA & left EIA/CFA endarterectomies, right CIA & EIA stents, right CFA angioplasty, right-to-left FFBG 06/08/19; I&D left groin seroma 07/06/19), BPH.   Last cardiology follow-up was on 06/30/21 with Dr. Flora Lipps for aflutter follow-up, s/p DCCV 11/03/20. EKG showed SR with PACs. Edward Campos had stopped Eliquis because he felt poorly on it. He was not interested in trying another anticoagulant, but agreed to ASA. CHADSVASC=3 (age, PAD) No chest pain or SOB. HTN and HLD controlled. 12 month follow-up had been planned.  He is a same day work-up. Labs and EKG on arrival as indicated. Anesthesia team to evaluate on the day of surgery.    VS:  BP Readings from Last 3 Encounters:  08/15/22 137/82  06/30/21 126/64  12/08/20 135/75   Pulse Readings from Last 3 Encounters:  08/15/22 (!) 57  06/30/21 69  12/08/20  73     PROVIDERS: Patient, No Pcp Per Lennie Odor, MD is cardiologist   LABS: For day of surgery.    EKG: Last EKG noted is > 67 year old. EKG 06/30/21: Sinus rhythm with occasional PVCs   CV: LLE Bypass [place] 08/15/22: Summary:  Left: 75-99% stenosis noted in the inflow, otherwise bypass apears patent.  3.74 cm x 4.13 cm structure noted at the proximal graft with mixed echoes  anterior to the graft.    [place] Abd Aorta/Iliacs 08/15/22: Summary:  Abdominal Aorta: There is evidence of abnormal dilatation of the distal  Abdominal aorta. The largest aortic measurement is 6.6 cm. The largest  aortic diameter has increased compared to prior exam. Previous diameter  measurement was 4.7 cm obtained on  12/07/20.      Long term monitor 11/24/20-12/01/20: Impression: 1. Brief atrial tachycardia episodes detected (7 episodes in 7 days; longest 14.9 seconds).  2. Frequent PVCs (10.7% burden).  3. No atrial fibrillation or flutter detected.    Echo 11/10/20: IMPRESSIONS   1. Left ventricular ejection fraction, by estimation, is 65 to 70%. The  left ventricle has normal function. The left ventricle has no regional  wall motion abnormalities. Left ventricular diastolic parameters are  indeterminate. Elevated left atrial  pressure.   2. Right ventricular systolic function is normal. The right ventricular  size is normal. There is normal pulmonary artery systolic pressure.   3. Left atrial size was  moderately dilated.   4. Right atrial size was mildly dilated.   5. The mitral valve is abnormal. Mild mitral valve regurgitation. No  evidence of mitral stenosis.   6. The aortic valve was not well visualized. There is mild calcification  of the aortic valve. There is mild thickening of the aortic valve. Aortic  valve regurgitation is not visualized. No aortic stenosis is present.   7. The inferior vena cava is dilated in size with >50% respiratory  variability, suggesting right atrial pressure  of 8 mmHg.    Nuclear stress test 06/05/19:  The left ventricular ejection fraction is normal (55-65%). Nuclear stress EF: 55%. There was no ST segment deviation noted during stress. Defect 1: There is a small defect of mild severity present in the apex location. The study is normal. This is a low risk study. Normal stress nuclear study with mild apical thinning but no ischemia. Gated ejection fraction 55% with normal wall motion.   Past Medical History:  Diagnosis Date   AAA (abdominal aortic aneurysm) (HCC)    Anxiety    Arthritis    BPH (benign prostatic hyperplasia)    Dizziness    Hyperlipidemia     Past Surgical History:  Procedure Laterality Date   ABDOMINAL AORTOGRAM W/LOWER EXTREMITY N/A 05/06/2019   Procedure: ABDOMINAL AORTOGRAM W/LOWER EXTREMITY;  Surgeon: Cephus Shelling, MD;  [facility];  Service: Cardiovascular;  Laterality: N/A;   APPLICATION OF WOUND VAC  07/06/2019   Procedure: Application Of Wound Vac Left Groin;  Surgeon: Sherren Kerns, MD;  Location[facility] OR;  Service: Vascular;;   CARDIOVERSION N/A 11/03/2020   Procedure: CARDIOVERSION;  Surgeon: Thurmon Fair, MD;  [facility];  Service: Cardiovascular;  Laterality: N/A;   ENDARTERECTOMY FEMORAL Bilateral 06/08/2019   Procedure: ENDARTERECTOMY RIGHT COMMON FEMORAL; ENDARTERECTOMY RIGHT EXTERNAL ILIAC; ENDARTERECTOMY LEFT COMMON FEMORAL; ENDARTERECTOMY LEFT EXTERNAL ILIAC;  Surgeon: Sherren Kerns, MD;  [facility];  Service: Vascular;  Laterality: Bilateral;   FEMORAL-FEMORAL BYPASS GRAFT Bilateral 06/08/2019   Procedure: BYPASS GRAFT FEMORAL-FEMORAL ARTERY;  Surgeon: Sherren Kerns, MD;  Location: [facility] OR;  Service: Vascular;  Laterality: Bilateral;   GROIN DEBRIDEMENT Left 07/06/2019   Procedure: INCISION AND DRAINAGE OF LEFT GROIN;  Surgeon: Sherren Kerns, MD;  Location: [facility] OR;  Service: Vascular;  Laterality: Left;   growth removed from chest     HEMORRHOID  SURGERY     HERNIA REPAIR  12 yrs ago   left inguinal hernia   INCISE AND DRAIN ABCESS Left 07/06/2019   Incision and drainage left groin placement of VAC dressing   INGUINAL HERNIA REPAIR  10/16/2010   Procedure: HERNIA REPAIR INGUINAL ADULT;  Surgeon: Dalia Heading;  Location: [facility]S;  Service: General;  Laterality: Right;   INSERTION OF ILIAC STENT Right 06/08/2019   Procedure: INSERTION OF RIGHT COMMON ILIAC AND EXTERNAL ILICAC STENT;  Surgeon: Sherren Kerns, MD;  Location: [facility] OR;  Service: Vascular;  Laterality: Right;   PATCH ANGIOPLASTY Right 06/08/2019   Procedure: Patch Angioplasty Right Common Femoral;  Surgeon: Sherren Kerns, MD;  Location: [facility] OR;  Service: Vascular;  Laterality: Right;    MEDICATIONS: No current facility-administered medications for this encounter.    aspirin EC 81 MG tablet   diphenhydramine-acetaminophen (TYLENOL PM) 25-500 MG TABS tablet   ibuprofen (ADVIL) 200 MG tablet   Multiple Vitamins-Minerals (MULTIVITAMIN WITH MINERALS) tablet   rosuvastatin (CRESTOR) 20 MG tablet   Simethicone (GAS RELIEF  EXTRA STRENGTH PO)    Shonna Chock, PA-C Surgical Short Stay/Anesthesiology [facility] Phone [phone] [facility] Phone [phone] 08/15/2022 2:36 PM

## 2022-08-15 NOTE — Anesthesia Preprocedure Evaluation (Addendum)
Anesthesia Evaluation  Patient identified by MRN, date of birth, ID band Patient awake    Reviewed: Allergy & Precautions, H&P , NPO status , Patient's Chart, lab work & pertinent test results  Airway Mallampati: II  TM Distance: >3 FB Neck ROM: Full    Dental no notable dental hx. (+) Teeth Intact, Dental Advisory Given   Pulmonary neg pulmonary ROS, former smoker   Pulmonary exam normal breath sounds clear to auscultation       Cardiovascular Exercise Tolerance: Good + Peripheral Vascular Disease   Rhythm:Regular Rate:Normal     Neuro/Psych   Anxiety     negative neurological ROS     GI/Hepatic negative GI ROS, Neg liver ROS,,,  Endo/Other  negative endocrine ROS    Renal/GU negative Renal ROS  negative genitourinary   Musculoskeletal  (+) Arthritis , Osteoarthritis,    Abdominal   Peds  Hematology negative hematology ROS (+)   Anesthesia Other Findings   Reproductive/Obstetrics negative OB ROS                             Anesthesia Physical Anesthesia Plan  ASA: 3  Anesthesia Plan: General   Post-op Pain Management: Tylenol PO (pre-op)*   Induction: Intravenous  PONV Risk Score and Plan: 3 and Ondansetron, Dexamethasone and Treatment may vary due to age or medical condition  Airway Management Planned: Oral ETT  Additional Equipment: Arterial line  Intra-op Plan:   Post-operative Plan: Extubation in OR  Informed Consent: I have reviewed the patients History and Physical, chart, labs and discussed the procedure including the risks, benefits and alternatives for the proposed anesthesia with the patient or authorized representative who has indicated his/her understanding and acceptance.     Dental advisory given  Plan Discussed with: CRNA  Anesthesia Plan Comments: (PAT note written 08/15/2022 by Shonna Chock, PA-C.  )       Anesthesia Quick Evaluation

## 2022-08-15 NOTE — Progress Notes (Signed)
SDW call  Patient was given pre-op instructions over the phone. Patient verbalized understanding of instructions provided.    PCP - Denies Cardiologist - Dr. Lennie Odor Pulmonary:    PPM/ICD - denies   Chest x-ray - n/a EKG -  06/30/2021 Stress Test -06/05/2019 ECHO - 11/10/2020 Cardiac Cath -   Sleep Study/sleep apnea/CPAP: denies  Non-diabetic   Blood Thinner Instructions: denies Aspirin Instructions: continue   ERAS Protcol - No, NPO PRE-SURGERY Ensure or G2-    COVID TEST- n/a    Anesthesia review: Yes. AAA, fem-pop   Patient denies shortness of breath, fever, cough and chest pain over the phone call  Your procedure is scheduled on Thursday August 16, 2022  Report to [facility] Main Entrance "A" at  0530  A.M., then check in with the Admitting office.  Call this number if you have problems the morning of surgery:  [phone]   If you have any questions prior to your surgery date call [phone]: Open Monday-Friday 8am-4pm If you experience any cold or flu symptoms such as cough, fever, chills, shortness of breath, etc. between now and your scheduled surgery, please notify us at the above number    Remember:  Do not eat or drink after midnight the night before your surgery  Take these medicines the morning of surgery with A SIP OF WATER:  Rosuvastatin, ASA  As of today, STOP taking any Aleve, Naproxen, Ibuprofen, Motrin, Advil, Goody's, BC's, all herbal medications, fish oil, and all vitamins.

## 2022-08-15 NOTE — Progress Notes (Signed)
 Patient ID: Edward Campos, male   DOB: 08/20/1932, 89 y.o.   MRN: [id number]  Reason for Consult: Follow-up   Referred by No ref. provider found  Subjective:     HPI:  Edward Campos is a 89 y.o. male with a history of a right common and external iliac artery stenting with right to left femorofemoral bypass 3 years ago.  At last visit he was having mild claudication bilaterally.  He states that he had a lump in his right groin now for about 3 months.  He denies any fevers or chills.  He also has a known abdominal aortic aneurysm which has been followed in our office.  No new back or abdominal pain.    Past Medical History:  Diagnosis Date   AAA (abdominal aortic aneurysm) (HCC)    Anxiety    Arthritis    BPH (benign prostatic hyperplasia)    Dizziness    Hyperlipidemia    Family History  Problem Relation Age of Onset   Stroke Mother    Pseudochol deficiency Neg Hx    Malignant hyperthermia Neg Hx    Hypotension Neg Hx    Anesthesia problems Neg Hx    Past Surgical History:  Procedure Laterality Date   ABDOMINAL AORTOGRAM W/LOWER EXTREMITY N/A 05/06/2019   Procedure: ABDOMINAL AORTOGRAM W/LOWER EXTREMITY;  Surgeon: Clark, Christopher J, MD;  Location: [facility];  Service: Cardiovascular;  Laterality: N/A;   APPLICATION OF WOUND VAC  07/06/2019   Procedure: Application Of Wound Vac Left Groin;  Surgeon: Fields, Charles E, MD;  Location: [facility] OR;  Service: Vascular;;   CARDIOVERSION N/A 11/03/2020   Procedure: CARDIOVERSION;  Surgeon: Croitoru, Mihai, MD;  Location: [facility];  Service: Cardiovascular;  Laterality: N/A;   ENDARTERECTOMY FEMORAL Bilateral 06/08/2019   Procedure: ENDARTERECTOMY RIGHT COMMON FEMORAL; ENDARTERECTOMY RIGHT EXTERNAL ILIAC; ENDARTERECTOMY LEFT COMMON FEMORAL; ENDARTERECTOMY LEFT EXTERNAL ILIAC;  Surgeon: Fields, Charles E, MD;  Location: [facility] OR;  Service: Vascular;  Laterality: Bilateral;   FEMORAL-FEMORAL BYPASS GRAFT Bilateral 06/08/2019    Procedure: BYPASS GRAFT FEMORAL-FEMORAL ARTERY;  Surgeon: Fields, Charles E, MD;  Location: [facility] OR;  Service: Vascular;  Laterality: Bilateral;   GROIN DEBRIDEMENT Left 07/06/2019   Procedure: INCISION AND DRAINAGE OF LEFT GROIN;  Surgeon: Fields, Charles E, MD;  Location: [facility] OR;  Service: Vascular;  Laterality: Left;   growth removed from chest     HEMORRHOID SURGERY     HERNIA REPAIR  12 yrs ago   left inguinal hernia   INCISE AND DRAIN ABCESS Left 07/06/2019   Incision and drainage left groin placement of VAC dressing   INGUINAL HERNIA REPAIR  10/16/2010   Procedure: HERNIA REPAIR INGUINAL ADULT;  Surgeon: Mark A Jenkins;  Location: [facility];  Service: General;  Laterality: Right;   INSERTION OF ILIAC STENT Right 06/08/2019   Procedure: INSERTION OF RIGHT COMMON ILIAC AND EXTERNAL ILICAC STENT;  Surgeon: Fields, Charles E, MD;  Location: [facility];  Service: Vascular;  Laterality: Right;   PATCH ANGIOPLASTY Right 06/08/2019   Procedure: Patch Angioplasty Right Common Femoral;  Surgeon: Fields, Charles E, MD;  Location: [facility] OR;  Service: Vascular;  Laterality: Right;    Short Social History:  Social History   Tobacco Use   Smoking status: Former    Packs/day: 0.25    Years: 70.00    Additional pack years: 0.00    Total pack years: 17.50    Types: Cigarettes    Quit date: 05/30/2019      Years since quitting: 3.2   Smokeless tobacco: Never  Substance Use Topics   Alcohol use: Yes    Alcohol/week: 3.0 standard drinks of alcohol    Types: 3 Shots of liquor per week    Comment: 3 2oz. shots of bourbon/day    Allergies  Allergen Reactions   Penicillins Other (See Comments)    UNSPECIFIED REACTION  Did it involve swelling of the face/tongue/throat, SOB, or low BP? Unknown Did it involve sudden or severe rash/hives, skin peeling, or any reaction on the inside of your mouth or nose? Unknown Did you need to seek medical attention at a hospital or doctor's office? Unknown When did it last happen?  Between ages 10-13   If all above answers are "NO", may proceed with cephalosporin use.     Current Outpatient Medications  Medication Sig Dispense Refill   acetaminophen (TYLENOL) 650 MG CR tablet Take 650 mg by mouth 2 (two) times daily as needed for pain.     aspirin EC 81 MG tablet Take 1 tablet (81 mg total) by mouth daily. Swallow whole. 90 tablet 3   Multiple Vitamins-Minerals (MULTIVITAMIN WITH MINERALS) tablet Take 1 tablet by mouth daily.     rosuvastatin (CRESTOR) 20 MG tablet TAKE 1 TABLET(20 MG) BY MOUTH DAILY 90 tablet 12   Simethicone (GAS RELIEF EXTRA STRENGTH PO) Take 1 tablet by mouth as needed.     No current facility-administered medications for this visit.    Review of Systems  Constitutional:  Constitutional negative. HENT: HENT negative.  Eyes: Eyes negative.  Respiratory: Respiratory negative.  Cardiovascular: Positive for claudication.  GI: Gastrointestinal negative.  Musculoskeletal:       Lump in right groin Skin: Skin negative.  Neurological: Neurological negative. Hematologic: Hematologic/lymphatic negative.  Psychiatric: Psychiatric negative.        Objective:  Objective   Vitals:   08/15/22 1017  BP: 137/82  Pulse: (!) 57  Resp: 20  Temp: 97.9 F (36.6 C)  SpO2: 96%     Physical Exam HENT:     Head: Normocephalic.     Nose: Nose normal.  Eyes:     Pupils: Pupils are equal, round, and reactive to light.  Cardiovascular:     Rate and Rhythm: Normal rate.     Pulses:          Femoral pulses are 2+ on the right side and 2+ on the left side. Pulmonary:     Effort: Pulmonary effort is normal.  Abdominal:     General: Abdomen is flat.  Musculoskeletal:     Right lower leg: No edema.     Left lower leg: No edema.  Skin:    General: Skin is warm.     Capillary Refill: Capillary refill takes less than 2 seconds.  Neurological:     General: No focal deficit present.     Mental Status: He is alert.  Psychiatric:        Mood and  Affect: Mood normal.        Thought Content: Thought content normal.        Judgment: Judgment normal.     Data: ABI Findings:  +---------+------------------+-----+----------+--------+  Right   Rt Pressure (mmHg)IndexWaveform  Comment   +---------+------------------+-----+----------+--------+  Brachial 168                                        +---------+------------------+-----+----------+--------+    PTA     137               0.82 monophasic          +---------+------------------+-----+----------+--------+  DP      122               0.73 monophasic          +---------+------------------+-----+----------+--------+  Great Toe76                0.45 Abnormal            +---------+------------------+-----+----------+--------+   +---------+------------------+-----+----------+-------+  Left    Lt Pressure (mmHg)IndexWaveform  Comment  +---------+------------------+-----+----------+-------+  Brachial 157                                       +---------+------------------+-----+----------+-------+  PTA     137               0.82 monophasic         +---------+------------------+-----+----------+-------+  DP      139               0.83 monophasic         +---------+------------------+-----+----------+-------+  Great Toe116               0.69 Abnormal           +---------+------------------+-----+----------+-------+   +-------+-----------+-----------+------------+------------+  ABI/TBIToday's ABIToday's TBIPrevious ABIPrevious TBI  +-------+-----------+-----------+------------+------------+  Right 0.82       0.45       0.79        0.40          +-------+-----------+-----------+------------+------------+  Left  0.83       0.69       0.73        0.53          +-------+-----------+-----------+------------+------------+      Bilateral TBIs appear essentially unchanged compared to prior study on  12/07/20.     Summary:  Right: Resting right ankle-brachial index indicates mild right lower  extremity arterial disease. The right toe-brachial index is abnormal.   Left: Resting left ankle-brachial index indicates mild left lower  extremity arterial disease. The left toe-brachial index is abnormal.  .  Abdominal Aorta Findings:  +-----------+-------+----------+----------+--------+--------+--------+  Location  AP (cm)Trans (cm)PSV (cm/s)WaveformThrombusComments  +-----------+-------+----------+----------+--------+--------+--------+  Proximal  4.93   4.99      63                                  +-----------+-------+----------+----------+--------+--------+--------+  Mid       5.11   5.93      53                                  +-----------+-------+----------+----------+--------+--------+--------+  Distal    5.24   6.63      38                                  +-----------+-------+----------+----------+--------+--------+--------+  RT CIA Prox1.1    1.5       141                                 +-----------+-------+----------+----------+--------+--------+--------+    RT EIA Prox                 195                                 +-----------+-------+----------+----------+--------+--------+--------+  LT CIA Prox                                           NV        +-----------+-------+----------+----------+--------+--------+--------+         Summary:  Abdominal Aorta: There is evidence of abnormal dilatation of the distal  Abdominal aorta. The largest aortic measurement is 6.6 cm. The largest  aortic diameter has increased compared to prior exam. Previous diameter  measurement was 4.7 cm obtained on  12/07/20.         Assessment/Plan:    89-year-old male with history of right common and external iliac artery stenting and right femoral to left femoral bypass.  He has a new knot over the right side of the femorofemoral bypass and evaluated  with ultrasound today and there is clear fluid collection around the bypass although the anastomosis all appear intact.  He also now has a 6.6 cm abdominal aortic aneurysm which she is reasonably concerned about.  Given the concern for infection of the femorofemoral graft we are going to plan drainage in the OR tomorrow and we will likely place him on antibiotics with wound VAC as long as the graft does not appear to have any disruption of the anastomoses we can attempt salvage of the graft.  Will need CT angio of abdomen pelvis to follow-up aneurysm and possibly plan aorto Uni iliac repair if indeed it is greater than 5.5 cm but certainly he needs to get over any infectious etiologies prior.  All questions answered today we will plan for surgical intervention tomorrow in the OR.     Hosey Burmester Christopher Betina Puckett MD [facility] [place]   

## 2022-08-16 ENCOUNTER — Encounter (HOSPITAL_COMMUNITY): Payer: Self-pay | Admitting: Vascular Surgery

## 2022-08-16 ENCOUNTER — Encounter (HOSPITAL_COMMUNITY)
Admission: RE | Disposition: A | Discharge status: Home / Self Care | Payer: Self-pay | Source: Home / Self Care | Attending: Vascular Surgery

## 2022-08-16 ENCOUNTER — Other Ambulatory Visit: Payer: Self-pay

## 2022-08-16 ENCOUNTER — Inpatient Hospital Stay (HOSPITAL_COMMUNITY): Payer: Medicare Other | Admitting: Vascular Surgery

## 2022-08-16 ENCOUNTER — Inpatient Hospital Stay (HOSPITAL_COMMUNITY): Payer: Medicare Other

## 2022-08-16 ENCOUNTER — Inpatient Hospital Stay (HOSPITAL_COMMUNITY)
Admission: RE | Admit: 2022-08-16 | Discharge: 2022-08-24 | DRG: 264 | Disposition: A | Discharge status: Home / Self Care | Payer: Medicare Other | Attending: Vascular Surgery | Admitting: Vascular Surgery

## 2022-08-16 DIAGNOSIS — M1990 Unspecified osteoarthritis, unspecified site: Secondary | ICD-10-CM | POA: Diagnosis present

## 2022-08-16 DIAGNOSIS — I10 Essential (primary) hypertension: Secondary | ICD-10-CM | POA: Diagnosis present

## 2022-08-16 DIAGNOSIS — Y832 Surgical operation with anastomosis, bypass or graft as the cause of abnormal reaction of the patient, or of later complication, without mention of misadventure at the time of the procedure: Secondary | ICD-10-CM | POA: Diagnosis present

## 2022-08-16 DIAGNOSIS — T827XXA Infection and inflammatory reaction due to other cardiac and vascular devices, implants and grafts, initial encounter: Secondary | ICD-10-CM | POA: Diagnosis present

## 2022-08-16 DIAGNOSIS — N400 Enlarged prostate without lower urinary tract symptoms: Secondary | ICD-10-CM | POA: Diagnosis present

## 2022-08-16 DIAGNOSIS — Z87891 Personal history of nicotine dependence: Secondary | ICD-10-CM

## 2022-08-16 DIAGNOSIS — I714 Abdominal aortic aneurysm, without rupture, unspecified: Secondary | ICD-10-CM | POA: Diagnosis present

## 2022-08-16 DIAGNOSIS — Z880 Allergy status to penicillin: Secondary | ICD-10-CM

## 2022-08-16 DIAGNOSIS — Y92230 Patient room in hospital as the place of occurrence of the external cause: Secondary | ICD-10-CM | POA: Diagnosis not present

## 2022-08-16 DIAGNOSIS — F419 Anxiety disorder, unspecified: Secondary | ICD-10-CM

## 2022-08-16 DIAGNOSIS — Z823 Family history of stroke: Secondary | ICD-10-CM | POA: Diagnosis not present

## 2022-08-16 DIAGNOSIS — Z7982 Long term (current) use of aspirin: Secondary | ICD-10-CM | POA: Diagnosis not present

## 2022-08-16 DIAGNOSIS — I739 Peripheral vascular disease, unspecified: Secondary | ICD-10-CM

## 2022-08-16 DIAGNOSIS — C3490 Malignant neoplasm of unspecified part of unspecified bronchus or lung: Secondary | ICD-10-CM | POA: Diagnosis not present

## 2022-08-16 DIAGNOSIS — R918 Other nonspecific abnormal finding of lung field: Secondary | ICD-10-CM | POA: Diagnosis not present

## 2022-08-16 DIAGNOSIS — R846 Abnormal cytological findings in specimens from respiratory organs and thorax: Secondary | ICD-10-CM | POA: Diagnosis not present

## 2022-08-16 DIAGNOSIS — I998 Other disorder of circulatory system: Secondary | ICD-10-CM

## 2022-08-16 DIAGNOSIS — C3432 Malignant neoplasm of lower lobe, left bronchus or lung: Secondary | ICD-10-CM | POA: Diagnosis present

## 2022-08-16 DIAGNOSIS — Z79899 Other long term (current) drug therapy: Secondary | ICD-10-CM

## 2022-08-16 DIAGNOSIS — E785 Hyperlipidemia, unspecified: Secondary | ICD-10-CM | POA: Diagnosis present

## 2022-08-16 DIAGNOSIS — S99922A Unspecified injury of left foot, initial encounter: Secondary | ICD-10-CM | POA: Diagnosis not present

## 2022-08-16 DIAGNOSIS — W228XXA Striking against or struck by other objects, initial encounter: Secondary | ICD-10-CM | POA: Diagnosis not present

## 2022-08-16 DIAGNOSIS — J432 Centrilobular emphysema: Secondary | ICD-10-CM | POA: Diagnosis not present

## 2022-08-16 DIAGNOSIS — J189 Pneumonia, unspecified organism: Secondary | ICD-10-CM | POA: Diagnosis not present

## 2022-08-16 DIAGNOSIS — I7143 Infrarenal abdominal aortic aneurysm, without rupture: Secondary | ICD-10-CM | POA: Diagnosis not present

## 2022-08-16 DIAGNOSIS — J90 Pleural effusion, not elsewhere classified: Secondary | ICD-10-CM | POA: Diagnosis not present

## 2022-08-16 DIAGNOSIS — D649 Anemia, unspecified: Secondary | ICD-10-CM | POA: Diagnosis not present

## 2022-08-16 DIAGNOSIS — T8149XA Infection following a procedure, other surgical site, initial encounter: Secondary | ICD-10-CM | POA: Diagnosis not present

## 2022-08-16 HISTORY — PX: APPLICATION OF WOUND VAC: SHX5189

## 2022-08-16 HISTORY — PX: INCISION AND DRAINAGE OF WOUND: SHX1803

## 2022-08-16 LAB — COMPREHENSIVE METABOLIC PANEL
ALT: 27 U/L (ref 0–44)
AST: 29 U/L (ref 15–41)
Albumin: 3.7 g/dL (ref 3.5–5.0)
Alkaline Phosphatase: 79 U/L (ref 38–126)
Anion gap: 8 (ref 5–15)
BUN: 25 mg/dL — ABNORMAL HIGH (ref 8–23)
CO2: 26 mmol/L (ref 22–32)
Calcium: 8.8 mg/dL — ABNORMAL LOW (ref 8.9–10.3)
Chloride: 99 mmol/L (ref 98–111)
Creatinine, Ser: 1.18 mg/dL (ref 0.61–1.24)
GFR, Estimated: 60 mL/min — ABNORMAL LOW (ref >60)
Glucose, Bld: 112 mg/dL — ABNORMAL HIGH (ref 70–99)
Potassium: 4.4 mmol/L (ref 3.5–5.1)
Sodium: 133 mmol/L — ABNORMAL LOW (ref 135–145)
Total Bilirubin: 1.6 mg/dL — ABNORMAL HIGH (ref 0.3–1.2)
Total Protein: 6.2 g/dL — ABNORMAL LOW (ref 6.5–8.1)

## 2022-08-16 LAB — CBC
HCT: 38.9 % — ABNORMAL LOW (ref 39.0–52.0)
Hemoglobin: 13.7 g/dL (ref 13.0–17.0)
MCH: 30.9 pg (ref 26.0–34.0)
MCHC: 35.2 g/dL (ref 30.0–36.0)
MCV: 87.6 fL (ref 80.0–100.0)
Platelets: 199 10*3/uL (ref 150–400)
RBC: 4.44 MIL/uL (ref 4.22–5.81)
RDW: 14 % (ref 11.5–15.5)
WBC: 5.8 10*3/uL (ref 4.0–10.5)
nRBC: 0 % (ref 0.0–0.2)

## 2022-08-16 LAB — URINALYSIS, ROUTINE W REFLEX MICROSCOPIC
Bilirubin Urine: NEGATIVE
Glucose, UA: NEGATIVE mg/dL
Hgb urine dipstick: NEGATIVE
Ketones, ur: NEGATIVE mg/dL
Leukocytes,Ua: NEGATIVE
Nitrite: NEGATIVE
Protein, ur: NEGATIVE mg/dL
Specific Gravity, Urine: 1.017 (ref 1.005–1.030)
pH: 6 (ref 5.0–8.0)

## 2022-08-16 LAB — AEROBIC/ANAEROBIC CULTURE W GRAM STAIN (SURGICAL/DEEP WOUND)

## 2022-08-16 LAB — SURGICAL PCR SCREEN
MRSA, PCR: NEGATIVE
Staphylococcus aureus: NEGATIVE

## 2022-08-16 LAB — TYPE AND SCREEN
ABO/RH(D): O POS
Antibody Screen: NEGATIVE

## 2022-08-16 LAB — APTT: aPTT: 28 seconds (ref 24–36)

## 2022-08-16 LAB — PROTIME-INR
INR: 1.2 (ref 0.8–1.2)
Prothrombin Time: 15.3 seconds — ABNORMAL HIGH (ref 11.4–15.2)

## 2022-08-16 SURGERY — APPLICATION, WOUND VAC
Anesthesia: General | Site: Groin | Laterality: Right

## 2022-08-16 MED ORDER — MORPHINE SULFATE (PF) 2 MG/ML IV SOLN: 2.0000 mg | INTRAVENOUS | Status: DC | PRN

## 2022-08-16 MED ORDER — ONDANSETRON HCL 4 MG/2ML IJ SOLN: 4.0000 mg | Freq: Four times a day (QID) | INTRAMUSCULAR | Status: DC | PRN

## 2022-08-16 MED ORDER — IOHEXOL 350 MG/ML SOLN
100.0000 mL | Freq: Once | INTRAVENOUS | Status: AC | PRN
  Administered 2022-08-16: 100 mL via INTRAVENOUS

## 2022-08-16 MED ORDER — SODIUM CHLORIDE 0.9 % IV SOLN: INTRAVENOUS | Status: DC

## 2022-08-16 MED ORDER — SODIUM CHLORIDE 0.9 % IV SOLN
1.0000 g | Freq: Once | INTRAVENOUS | Status: DC
  Filled 2022-08-16: qty 10

## 2022-08-16 MED ORDER — PANTOPRAZOLE SODIUM 40 MG PO TBEC
40.0000 mg | DELAYED_RELEASE_TABLET | Freq: Every day | ORAL | Status: DC
  Administered 2022-08-16 – 2022-08-24 (×7): 40 mg via ORAL
  Filled 2022-08-16 (×8): qty 1

## 2022-08-16 MED ORDER — ROCURONIUM BROMIDE 10 MG/ML (PF) SYRINGE
PREFILLED_SYRINGE | INTRAVENOUS | Status: AC
  Filled 2022-08-16: qty 10

## 2022-08-16 MED ORDER — HYDROMORPHONE HCL 1 MG/ML IJ SOLN: 0.2500 mg | INTRAMUSCULAR | Status: DC | PRN

## 2022-08-16 MED ORDER — VANCOMYCIN HCL IN DEXTROSE 1-5 GM/200ML-% IV SOLN
1000.0000 mg | INTRAVENOUS | Status: DC
  Administered 2022-08-17 – 2022-08-23 (×7): 1000 mg via INTRAVENOUS
  Filled 2022-08-16 (×7): qty 200

## 2022-08-16 MED ORDER — DOCUSATE SODIUM 100 MG PO CAPS
100.0000 mg | ORAL_CAPSULE | Freq: Two times a day (BID) | ORAL | Status: DC
  Administered 2022-08-16 – 2022-08-23 (×12): 100 mg via ORAL
  Filled 2022-08-16 (×16): qty 1

## 2022-08-16 MED ORDER — CHLORHEXIDINE GLUCONATE CLOTH 2 % EX PADS: 6.0000 | MEDICATED_PAD | Freq: Once | CUTANEOUS | Status: DC

## 2022-08-16 MED ORDER — DEXAMETHASONE SODIUM PHOSPHATE 10 MG/ML IJ SOLN
INTRAMUSCULAR | Status: DC | PRN
  Administered 2022-08-16: 5 mg via INTRAVENOUS

## 2022-08-16 MED ORDER — SODIUM CHLORIDE 0.9 % IV SOLN
250.0000 mL | INTRAVENOUS | Status: DC | PRN
  Administered 2022-08-21 – 2022-08-23 (×2): 250 mL via INTRAVENOUS

## 2022-08-16 MED ORDER — LIDOCAINE 2% (20 MG/ML) 5 ML SYRINGE
INTRAMUSCULAR | Status: AC
  Filled 2022-08-16: qty 5

## 2022-08-16 MED ORDER — ACETAMINOPHEN 500 MG PO TABS
1000.0000 mg | ORAL_TABLET | Freq: Once | ORAL | Status: AC
  Administered 2022-08-16: 1000 mg via ORAL
  Filled 2022-08-16: qty 2

## 2022-08-16 MED ORDER — FENTANYL CITRATE (PF) 250 MCG/5ML IJ SOLN
INTRAMUSCULAR | Status: DC | PRN
  Administered 2022-08-16: 100 ug via INTRAVENOUS

## 2022-08-16 MED ORDER — ONDANSETRON HCL 4 MG/2ML IJ SOLN
INTRAMUSCULAR | Status: DC | PRN
  Administered 2022-08-16: 4 mg via INTRAVENOUS

## 2022-08-16 MED ORDER — METOPROLOL TARTRATE 5 MG/5ML IV SOLN: 2.0000 mg | INTRAVENOUS | Status: DC | PRN

## 2022-08-16 MED ORDER — SUGAMMADEX SODIUM 200 MG/2ML IV SOLN
INTRAVENOUS | Status: DC | PRN
  Administered 2022-08-16: 200 mg via INTRAVENOUS

## 2022-08-16 MED ORDER — EPHEDRINE 5 MG/ML INJ
INTRAVENOUS | Status: AC
  Filled 2022-08-16: qty 5

## 2022-08-16 MED ORDER — ROSUVASTATIN CALCIUM 20 MG PO TABS
20.0000 mg | ORAL_TABLET | Freq: Every day | ORAL | Status: DC
  Filled 2022-08-16: qty 1

## 2022-08-16 MED ORDER — SODIUM CHLORIDE 0.9 % IV SOLN: 2.0000 g | Freq: Three times a day (TID) | INTRAVENOUS | Status: DC

## 2022-08-16 MED ORDER — HYDRALAZINE HCL 20 MG/ML IJ SOLN
5.0000 mg | INTRAMUSCULAR | Status: DC | PRN
  Administered 2022-08-21: 5 mg via INTRAVENOUS
  Filled 2022-08-16: qty 1

## 2022-08-16 MED ORDER — GUAIFENESIN-DM 100-10 MG/5ML PO SYRP: 15.0000 mL | ORAL_SOLUTION | ORAL | Status: DC | PRN

## 2022-08-16 MED ORDER — ONDANSETRON HCL 4 MG/2ML IJ SOLN
INTRAMUSCULAR | Status: AC
  Filled 2022-08-16: qty 2

## 2022-08-16 MED ORDER — HEPARIN 6000 UNIT IRRIGATION SOLUTION
Status: DC | PRN
  Administered 2022-08-16: 1

## 2022-08-16 MED ORDER — CHLORHEXIDINE GLUCONATE 0.12 % MT SOLN
15.0000 mL | OROMUCOSAL | Status: AC
  Administered 2022-08-16: 15 mL via OROMUCOSAL
  Filled 2022-08-16: qty 15

## 2022-08-16 MED ORDER — LIDOCAINE 2% (20 MG/ML) 5 ML SYRINGE
INTRAMUSCULAR | Status: DC | PRN
  Administered 2022-08-16: 60 mg via INTRAVENOUS

## 2022-08-16 MED ORDER — ACETAMINOPHEN 325 MG PO TABS
325.0000 mg | ORAL_TABLET | ORAL | Status: DC | PRN
  Administered 2022-08-23: 650 mg via ORAL
  Filled 2022-08-16: qty 2

## 2022-08-16 MED ORDER — LABETALOL HCL 5 MG/ML IV SOLN: 10.0000 mg | INTRAVENOUS | Status: DC | PRN

## 2022-08-16 MED ORDER — POTASSIUM CHLORIDE CRYS ER 20 MEQ PO TBCR: 20.0000 meq | EXTENDED_RELEASE_TABLET | Freq: Once | ORAL | Status: DC

## 2022-08-16 MED ORDER — DEXAMETHASONE SODIUM PHOSPHATE 10 MG/ML IJ SOLN
INTRAMUSCULAR | Status: AC
  Filled 2022-08-16: qty 1

## 2022-08-16 MED ORDER — HEPARIN SODIUM (PORCINE) 5000 UNIT/ML IJ SOLN
5000.0000 [IU] | Freq: Three times a day (TID) | INTRAMUSCULAR | Status: DC
  Administered 2022-08-17 – 2022-08-24 (×21): 5000 [IU] via SUBCUTANEOUS
  Filled 2022-08-16 (×19): qty 1

## 2022-08-16 MED ORDER — SODIUM CHLORIDE 0.9% FLUSH: 3.0000 mL | INTRAVENOUS | Status: DC | PRN

## 2022-08-16 MED ORDER — SODIUM CHLORIDE 0.9 % IR SOLN
Status: DC | PRN
  Administered 2022-08-16: 2000 mL

## 2022-08-16 MED ORDER — FENTANYL CITRATE (PF) 250 MCG/5ML IJ SOLN
INTRAMUSCULAR | Status: AC
  Filled 2022-08-16: qty 5

## 2022-08-16 MED ORDER — SODIUM CHLORIDE 0.9 % IV SOLN: 2.0000 g | Freq: Two times a day (BID) | INTRAVENOUS | Status: DC

## 2022-08-16 MED ORDER — PROPOFOL 10 MG/ML IV BOLUS
INTRAVENOUS | Status: AC
  Filled 2022-08-16: qty 20

## 2022-08-16 MED ORDER — PHENOL 1.4 % MT LIQD: 1.0000 | OROMUCOSAL | Status: DC | PRN

## 2022-08-16 MED ORDER — ACETAMINOPHEN 325 MG RE SUPP: 325.0000 mg | RECTAL | Status: DC | PRN

## 2022-08-16 MED ORDER — VANCOMYCIN HCL IN DEXTROSE 1-5 GM/200ML-% IV SOLN
1000.0000 mg | INTRAVENOUS | Status: AC
  Administered 2022-08-16: 1000 mg via INTRAVENOUS
  Filled 2022-08-16: qty 200

## 2022-08-16 MED ORDER — SODIUM CHLORIDE 0.9 % IV SOLN
2.0000 g | Freq: Two times a day (BID) | INTRAVENOUS | Status: DC
  Administered 2022-08-16 – 2022-08-24 (×17): 2 g via INTRAVENOUS
  Filled 2022-08-16 (×17): qty 12.5

## 2022-08-16 MED ORDER — BISACODYL 5 MG PO TBEC
5.0000 mg | DELAYED_RELEASE_TABLET | Freq: Every day | ORAL | Status: DC | PRN
  Administered 2022-08-19 – 2022-08-23 (×2): 5 mg via ORAL
  Filled 2022-08-16 (×2): qty 1

## 2022-08-16 MED ORDER — ALUM & MAG HYDROXIDE-SIMETH 200-200-20 MG/5ML PO SUSP: 15.0000 mL | ORAL | Status: DC | PRN

## 2022-08-16 MED ORDER — 0.9 % SODIUM CHLORIDE (POUR BTL) OPTIME
TOPICAL | Status: DC | PRN
  Administered 2022-08-16: 1000 mL

## 2022-08-16 MED ORDER — ROCURONIUM BROMIDE 10 MG/ML (PF) SYRINGE
PREFILLED_SYRINGE | INTRAVENOUS | Status: DC | PRN
  Administered 2022-08-16: 40 mg via INTRAVENOUS
  Administered 2022-08-16: 20 mg via INTRAVENOUS

## 2022-08-16 MED ORDER — HEPARIN 6000 UNIT IRRIGATION SOLUTION
Status: AC
  Filled 2022-08-16: qty 500

## 2022-08-16 MED ORDER — ASPIRIN 81 MG PO TBEC
81.0000 mg | DELAYED_RELEASE_TABLET | Freq: Every day | ORAL | Status: DC
  Filled 2022-08-16: qty 1

## 2022-08-16 MED ORDER — SODIUM CHLORIDE 0.9% FLUSH
3.0000 mL | Freq: Two times a day (BID) | INTRAVENOUS | Status: DC
  Administered 2022-08-16 – 2022-08-24 (×13): 3 mL via INTRAVENOUS

## 2022-08-16 MED ORDER — OXYCODONE-ACETAMINOPHEN 5-325 MG PO TABS: 1.0000 | ORAL_TABLET | ORAL | Status: DC | PRN

## 2022-08-16 MED ORDER — EPHEDRINE SULFATE-NACL 50-0.9 MG/10ML-% IV SOSY
PREFILLED_SYRINGE | INTRAVENOUS | Status: DC | PRN
  Administered 2022-08-16: 10 mg via INTRAVENOUS

## 2022-08-16 MED ORDER — LACTATED RINGERS IV SOLN: INTRAVENOUS | Status: DC

## 2022-08-16 MED ORDER — SENNOSIDES-DOCUSATE SODIUM 8.6-50 MG PO TABS: 1.0000 | ORAL_TABLET | Freq: Every evening | ORAL | Status: DC | PRN

## 2022-08-16 MED ORDER — PROPOFOL 10 MG/ML IV BOLUS
INTRAVENOUS | Status: DC | PRN
  Administered 2022-08-16: 120 mg via INTRAVENOUS

## 2022-08-16 SURGICAL SUPPLY — 45 items
ADH SKN CLS APL DERMABOND .7 (GAUZE/BANDAGES/DRESSINGS) ×3
BAG COUNTER SPONGE SURGICOUNT (BAG) ×3 IMPLANT
BAG SPNG CNTER NS LX DISP (BAG) ×3
CANISTER SUCT 3000ML PPV (MISCELLANEOUS) ×6 IMPLANT
CANISTER WOUND CARE 500ML ATS (WOUND CARE) ×1 IMPLANT
CLIP LIGATING EXTRA MED SLVR (CLIP) ×3 IMPLANT
CLIP LIGATING EXTRA SM BLUE (MISCELLANEOUS) ×3 IMPLANT
COVER SURGICAL LIGHT HANDLE (MISCELLANEOUS) ×3 IMPLANT
DERMABOND ADVANCED .7 DNX12 (GAUZE/BANDAGES/DRESSINGS) ×3 IMPLANT
DRAIN CHANNEL 15F RND FF W/TCR (WOUND CARE) IMPLANT
DRESSING VERAFLO CLEANS CC MED (GAUZE/BANDAGES/DRESSINGS) ×1 IMPLANT
DRSG VAC GRANUFOAM LG (GAUZE/BANDAGES/DRESSINGS) IMPLANT
DRSG VAC GRANUFOAM MED (GAUZE/BANDAGES/DRESSINGS) IMPLANT
DRSG VERAFLO CLEANSE CC MED (GAUZE/BANDAGES/DRESSINGS) ×3
ELECT REM PT RETURN 9FT ADLT (ELECTROSURGICAL) ×3
ELECTRODE REM PT RTRN 9FT ADLT (ELECTROSURGICAL) ×3 IMPLANT
EVACUATOR SILICONE 100CC (DRAIN) IMPLANT
GLOVE BIO SURGEON STRL SZ7.5 (GLOVE) ×3 IMPLANT
GOWN STRL REUS W/ TWL LRG LVL3 (GOWN DISPOSABLE) ×9 IMPLANT
GOWN STRL REUS W/ TWL XL LVL3 (GOWN DISPOSABLE) ×3 IMPLANT
GOWN STRL REUS W/TWL LRG LVL3 (GOWN DISPOSABLE) ×9
GOWN STRL REUS W/TWL XL LVL3 (GOWN DISPOSABLE) ×3
GRAFT SKIN WND MICRO 38 (Tissue) ×1 IMPLANT
HANDPIECE INTERPULSE COAX TIP (DISPOSABLE) ×3
HEMOSTAT SNOW SURGICEL 2X4 (HEMOSTASIS) IMPLANT
KIT BASIN OR (CUSTOM PROCEDURE TRAY) ×3 IMPLANT
KIT TURNOVER KIT B (KITS) ×3 IMPLANT
NS IRRIG 1000ML POUR BTL (IV SOLUTION) ×6 IMPLANT
PACK GENERAL/GYN (CUSTOM PROCEDURE TRAY) ×3 IMPLANT
PACK PERIPHERAL VASCULAR (CUSTOM PROCEDURE TRAY) ×3 IMPLANT
PACK UNIVERSAL I (CUSTOM PROCEDURE TRAY) IMPLANT
PAD ARMBOARD 7.5X6 YLW CONV (MISCELLANEOUS) ×6 IMPLANT
PAD NEG PRESSURE SENSATRAC (MISCELLANEOUS) ×3 IMPLANT
POWDER SURGICEL 3.0 GRAM (HEMOSTASIS) IMPLANT
SET HNDPC FAN SPRY TIP SCT (DISPOSABLE) ×1 IMPLANT
SUT MNCRL AB 4-0 PS2 18 (SUTURE) IMPLANT
SUT PROLENE 5 0 C 1 24 (SUTURE) ×6 IMPLANT
SUT PROLENE 6 0 BV (SUTURE) ×3 IMPLANT
SUT VIC AB 2-0 CT1 27 (SUTURE) ×6
SUT VIC AB 2-0 CT1 TAPERPNT 27 (SUTURE) ×6 IMPLANT
SUT VIC AB 3-0 SH 27 (SUTURE) ×6
SUT VIC AB 3-0 SH 27X BRD (SUTURE) ×6 IMPLANT
TOWEL GREEN STERILE (TOWEL DISPOSABLE) ×3 IMPLANT
TRAY FOLEY MTR SLVR 16FR STAT (SET/KITS/TRAYS/PACK) ×3 IMPLANT
WATER STERILE IRR 1000ML POUR (IV SOLUTION) ×3 IMPLANT

## 2022-08-16 NOTE — Progress Notes (Addendum)
Pharmacy Antibiotic Note  Edward Campos is a 89 y.o. male admitted on 08/16/2022 with  infected right to left femorofemoral bypass graft . Pt is s/p surgical I&D with wound VAC placement on 08/16/22. Pharmacy has been consulted for Cefepime and Vancomycin dosing.  Patient received Vancomycin 1000mg  IV x1 in OR at 08:18 on 08/16/22.  WBC 5.8, afebrile SCr 1.18  Plan: Initiate Cefepime 2g IV q12h  Continue Vancomycin 1000mg  IV q24h (eAUC ~424)    > Goal AUC 400-550    > Check vancomycin levels at steady state  Monitor daily CBC, temp, SCr, and for clinical signs of improvement  F/u cultures and de-escalate antibiotics as able   Height: 5\' 10"  (177.8 cm) Weight: 77.6 kg (171 lb) IBW/kg (Calculated) : 73  Temp (24hrs), Avg:97.7 F (36.5 C), Min:97.6 F (36.4 C), Max:97.9 F (36.6 C)  Recent Labs  Lab 08/16/22 0637  WBC 5.8  CREATININE 1.18    Estimated Creatinine Clearance: 45.5 mL/min (by C-G formula based on SCr of 1.18 mg/dL).    Allergies  Allergen Reactions   Penicillins Other (See Comments)    UNSPECIFIED REACTION  Did it involve swelling of the face/tongue/throat, SOB, or low BP? Unknown Did it involve sudden or severe rash/hives, skin peeling, or any reaction on the inside of your mouth or nose? Unknown Did you need to seek medical attention at a hospital or doctor's office? Unknown When did it last happen? Between ages 54-13   If all above answers are "NO", may proceed with cephalosporin use.     Antimicrobials this admission: Vancomycin 6/6 >>  Cefepime 6/6 >>   Dose adjustments this admission: N/A  Microbiology results: 6/6 OR culture from graft: sent 6/6 MRSA PCR: not detected   Thank you for allowing pharmacy to be a part of this patient's care.  Wilburn Cornelia, PharmD, BCPS Clinical Pharmacist 08/16/2022 11:42 AM   Please refer to [facility] for pharmacy phone number

## 2022-08-16 NOTE — Progress Notes (Signed)
Pt arrived to unit from PACU via wheelchair. Oriented to unit, CHG bath, VSS, and patient taken to CT almost directly after arriving on unit. Pt resting with call bell within reach.  Will continue to monitor.

## 2022-08-16 NOTE — Op Note (Signed)
    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 Randie Heinz, MD Assistant: Nathanial Rancher, PA Procedure Performed: 1.  Sharp excisional debridement of skin and subcutaneous tissue overlying the femoral-femoral bypass graft to 5 x 2 x 3 cm and pulse evac lavage totaling 2 L 2.  Application 38 cm fish skin with negative pressure wound VAC dressing  Indications: 89 year old male with history of a right to left femorofemoral bypass with common and external iliac artery stenting on the right with a known abdominal aortic aneurysm.  He presents to the office yesterday with enlarged aneurysm and now evidence of infection of his femorofemoral bypass graft.  He is indicated for debridement and will need workup for possible graft excision versus attempted wound VAC healing and consideration of abdominal aortic aneurysm repair in the future.  An experienced assistant was necessary to facilitate exposure of the graft along with washout and debridement and placement of negative pressure dressing.  Findings: The graft was incorporated proximally and distally within the wound but for approximately 5 cm was not incorporated with large volume surrounding brown and red purulence.  There was significant biofilm and this was curetted and the wound was washed out for a total of 2 L with pulse lavage and fish skin was placed with overlying wound VAC.   Procedure:  The patient was identified in the holding area and taken to the operating room was placed upon operative when general anesthesia was induced.  He was sterilely prepped and draped in bilateral groins and pelvis in the usual fashion, timeout was called.  We began by opening the obvious area of infection where we encountered significant purulence and this was cultured.  Antibiotics vancomycin and cefepime  were then administered.  We identified the graft and open the wound all the way where the graft was not incorporated.  The wound was curetted to remove biofilm and pulse lavage was performed totaling 2 L.  Hemostasis was obtained and we applied 38 cm of fish skin and then placed a negative pressure dressing with wound VAC.  Patient was then awakened from anesthesia having tolerated the procedure without any complication.  All counts were correct at completion.  Specimen: Groin wound culture aerobic anaerobic  EBL: 10 cc  .  Jayel Scaduto C. Randie Heinz, MD [facility] Office: [phone] Pager: [phone]

## 2022-08-16 NOTE — Transfer of Care (Signed)
Immediate Anesthesia Transfer of Care Note  Patient: Edward Campos  Procedure(s) Performed: APPLICATION OF WOUND VAC  Patient Location: PACU  Anesthesia Type:General  Level of Consciousness: awake, drowsy, and patient cooperative  Airway & Oxygen Therapy: Patient Spontanous Breathing  Post-op Assessment: Report given to RN and Post -op Vital signs reviewed and stable  Post vital signs: Reviewed and stable  Last Vitals:  Vitals Value Taken Time  BP 151/62 08/16/22 0851  Temp    Pulse 59 08/16/22 0857  Resp 16 08/16/22 0857  SpO2 100 % 08/16/22 0857  Vitals shown include unvalidated device data.  Last Pain:  Vitals:   08/16/22 0644  TempSrc:   PainSc: 0-No pain      Patients Stated Pain Goal: 0 (08/16/22 0644)  Complications: No notable events documented.

## 2022-08-16 NOTE — Interval H&P Note (Signed)
History and Physical Interval Note:  08/16/2022 7:05 AM  Edward Campos  has presented today for surgery, with the diagnosis of Infection of bypass graft.  The various methods of treatment have been discussed with the patient and family. After consideration of risks, benefits and other options for treatment, the patient has consented to  Procedure(s): REVISION OF FEMORAL-FEMORAL ARTERY BYPASS GRAFT (Bilateral) APPLICATION OF WOUND VAC (N/A) as a surgical intervention.  The patient's history has been reviewed, patient examined, no change in status, stable for surgery.  I have reviewed the patient's chart and labs.  Questions were answered to the patient's satisfaction.     Lemar Livings

## 2022-08-16 NOTE — Anesthesia Postprocedure Evaluation (Signed)
Anesthesia Post Note  Patient: Edward Campos  Procedure(s) Performed: APPLICATION OF WOUND VAC IRRIGATION AND DEBRIDEMENT WOUND (Right: Groin)     Patient location during evaluation: PACU Anesthesia Type: General Level of consciousness: awake and alert Pain management: pain level controlled Vital Signs Assessment: post-procedure vital signs reviewed and stable Respiratory status: spontaneous breathing, nonlabored ventilation and respiratory function stable Cardiovascular status: blood pressure returned to baseline and stable Postop Assessment: no apparent nausea or vomiting Anesthetic complications: no  No notable events documented.  Last Vitals:  Vitals:   08/16/22 1015 08/16/22 1030  BP: (!) 166/73 (!) 152/74  Pulse: (!) 56 (!) 58  Resp: 11 10  Temp:  36.4 C  SpO2: 97% 95%    Last Pain:  Vitals:   08/16/22 1030  TempSrc:   PainSc: 0-No pain                 Shawntia Mangal,W. EDMOND

## 2022-08-16 NOTE — Anesthesia Procedure Notes (Signed)
Procedure Name: Intubation Date/Time: 08/16/2022 7:41 AM  Performed by: Gus Puma, CRNAPre-anesthesia Checklist: Patient identified, Emergency Drugs available, Suction available and Patient being monitored Patient Re-evaluated:Patient Re-evaluated prior to induction Oxygen Delivery Method: Circle System Utilized Preoxygenation: Pre-oxygenation with 100% oxygen Induction Type: IV induction Ventilation: Mask ventilation without difficulty Laryngoscope Size: Mac and 4 Grade View: Grade I Tube type: Oral Tube size: 7.5 mm Number of attempts: 1 Airway Equipment and Method: Stylet Placement Confirmation: ETT inserted through vocal cords under direct vision, positive ETCO2 and breath sounds checked- equal and bilateral Secured at: 23 cm Tube secured with: Tape Dental Injury: Teeth and Oropharynx as per pre-operative assessment

## 2022-08-17 ENCOUNTER — Encounter: Payer: Self-pay | Admitting: Vascular Surgery

## 2022-08-17 ENCOUNTER — Encounter (HOSPITAL_COMMUNITY): Payer: Self-pay | Admitting: Vascular Surgery

## 2022-08-17 LAB — CBC
HCT: 38.2 % — ABNORMAL LOW (ref 39.0–52.0)
Hemoglobin: 13.2 g/dL (ref 13.0–17.0)
MCH: 30.6 pg (ref 26.0–34.0)
MCHC: 34.6 g/dL (ref 30.0–36.0)
MCV: 88.6 fL (ref 80.0–100.0)
Platelets: 199 10*3/uL (ref 150–400)
RBC: 4.31 MIL/uL (ref 4.22–5.81)
RDW: 13.8 % (ref 11.5–15.5)
WBC: 8.4 10*3/uL (ref 4.0–10.5)
nRBC: 0 % (ref 0.0–0.2)

## 2022-08-17 LAB — COMPREHENSIVE METABOLIC PANEL
ALT: 22 U/L (ref 0–44)
AST: 23 U/L (ref 15–41)
Albumin: 3.6 g/dL (ref 3.5–5.0)
Alkaline Phosphatase: 78 U/L (ref 38–126)
Anion gap: 7 (ref 5–15)
BUN: 25 mg/dL — ABNORMAL HIGH (ref 8–23)
CO2: 23 mmol/L (ref 22–32)
Calcium: 8.7 mg/dL — ABNORMAL LOW (ref 8.9–10.3)
Chloride: 103 mmol/L (ref 98–111)
Creatinine, Ser: 1.13 mg/dL (ref 0.61–1.24)
GFR, Estimated: >60 mL/min (ref >60)
Glucose, Bld: 88 mg/dL (ref 70–99)
Potassium: 3.9 mmol/L (ref 3.5–5.1)
Sodium: 133 mmol/L — ABNORMAL LOW (ref 135–145)
Total Bilirubin: 1.1 mg/dL (ref 0.3–1.2)
Total Protein: 6.1 g/dL — ABNORMAL LOW (ref 6.5–8.1)

## 2022-08-17 LAB — AEROBIC/ANAEROBIC CULTURE W GRAM STAIN (SURGICAL/DEEP WOUND)

## 2022-08-17 MED ORDER — ROSUVASTATIN CALCIUM 20 MG PO TABS
20.0000 mg | ORAL_TABLET | Freq: Every day | ORAL | Status: DC
  Administered 2022-08-18 – 2022-08-24 (×5): 20 mg via ORAL
  Filled 2022-08-17 (×7): qty 1

## 2022-08-17 MED ORDER — ASPIRIN 81 MG PO TBEC
81.0000 mg | DELAYED_RELEASE_TABLET | Freq: Every day | ORAL | Status: DC
  Administered 2022-08-18 – 2022-08-24 (×5): 81 mg via ORAL
  Filled 2022-08-17 (×7): qty 1

## 2022-08-17 NOTE — TOC CM/SW Note (Signed)
Transition of Care [facility]) - Inpatient Brief Assessment   Patient Details  Name: Edward Campos MRN: [id number] Date of Birth: Jul 23, 1932  [facility]) CM/SW Contact:    Darrold Span, RN Phone Number: 08/17/2022, 3:57 PM   Clinical Narrative: Noted pt s/p I&D with wound VAC placed- per MD notes plan for VAC change on Monday 6/10-  CM has placed 4M home wound VAC order form on chart for signature if needed-  Will follow and fax once signed.  Notified by Iantha Fallen liaison -following patient with MD office protocol referral prearranged for [facility] needs   Transition of Care Asessment: Insurance and Status: Insurance coverage has been reviewed Patient has primary care physician: No Home environment has been reviewed: home Prior level of function:: Independent Prior/Current Home Services: No current home services   Readmission risk has been reviewed: Yes Transition of care needs: transition of care needs identified, TOC will continue to follow

## 2022-08-17 NOTE — Progress Notes (Signed)
Mobility Specialist Progress Note:    08/17/22 1214  Mobility  Activity Ambulated with assistance in hallway  Level of Assistance Contact guard assist, steadying assist  Assistive Device None  Distance Ambulated (ft) 710 ft  Activity Response Tolerated well  Mobility Referral Yes  $Mobility charge 1 Mobility  Mobility Specialist Start Time (ACUTE ONLY) 1158  Mobility Specialist Stop Time (ACUTE ONLY) 1212  Mobility Specialist Time Calculation (min) (ACUTE ONLY) 14 min   Received pt in bed having no complaints and agreeable to mobility. Pt was asymptomatic throughout ambulation and returned to room w/o fault. Left in bed w/ call bell in reach and all needs met.    Thompson Grayer Mobility Specialist  Please contact vis Secure Chat or  Rehab Office [phone]

## 2022-08-17 NOTE — Progress Notes (Signed)
Pt upset that medications were not being given like he takes at home. Pt wanted to take medication before breakfast. Pharmacy request placed and aspirin and rosuvastatin order for 0600. Pt aware and states he will take in morning. Pt independent and wanting to do things a certain way. I explained that hospital likes items to be within reach to reduce fall risk. Pt addressed concerns that night shift and day shift kept moving his room around. I explained that we would try to keep things as he wanted. Pt resting with call bell within reach.  Will continue to monitor. Thomas Hoff, RN

## 2022-08-17 NOTE — Progress Notes (Addendum)
Progress Note    08/17/2022 7:54 AM 1 Day Post-Op  Subjective:  no complaints   Vitals:   08/17/22 0000 08/17/22 0343  BP:  (!) 143/71  Pulse: (!) 58 (!) 59  Resp:  19  Temp:  97.9 F (36.6 C)  SpO2:  98%    Physical Exam: General:  resting comfortably  Cardiac:  regular Lungs:  nonlabored Incisions:  lower abdominal incision with wound vac with good seal Extremities:  BLE warm and well perfused  CBC    Component Value Date/Time   WBC 5.8 08/16/2022 0637   RBC 4.44 08/16/2022 0637   HGB 13.7 08/16/2022 0637   HGB 13.9 10/27/2020 1038   HCT 38.9 (L) 08/16/2022 0637   HCT 41.0 10/27/2020 1038   PLT 199 08/16/2022 0637   PLT 208 10/27/2020 1038   MCV 87.6 08/16/2022 0637   MCV 90 10/27/2020 1038   MCH 30.9 08/16/2022 0637   MCHC 35.2 08/16/2022 0637   RDW 14.0 08/16/2022 0637   RDW 12.7 10/27/2020 1038   LYMPHSABS 2.0 10/10/2010 1100   MONOABS 0.8 10/10/2010 1100   EOSABS 0.3 10/10/2010 1100   BASOSABS 0.1 10/10/2010 1100    BMET    Component Value Date/Time   NA 133 (L) 08/16/2022 0637   NA 138 10/27/2020 1038   K 4.4 08/16/2022 0637   CL 99 08/16/2022 0637   CO2 26 08/16/2022 0637   GLUCOSE 112 (H) 08/16/2022 0637   BUN 25 (H) 08/16/2022 0637   BUN 17 10/27/2020 1038   CREATININE 1.18 08/16/2022 0637   CALCIUM 8.8 (L) 08/16/2022 0637   GFRNONAA 60 (L) 08/16/2022 0637   GFRAA >60 07/07/2019 0259    INR    Component Value Date/Time   INR 1.2 08/16/2022 0637     Intake/Output Summary (Last 24 hours) at 08/17/2022 0754 Last data filed at 08/17/2022 0700 Gross per 24 hour  Intake 850 ml  Output 1785 ml  Net -935 ml      Assessment/Plan:  89 y.o. male is 1 day post op, s/p: excisional debridement of skin overlying fem-fem graft with wound vac placement   -Pain is well controlled this morning -Wound vac overlying lower abdomen incision with good seal, no surrounding erythema -Currently on broad spectrum abx. Cultures pending -Plans for  wound vac change on Monday -OOB as tolerated   Loel Dubonnet, PA-C [facility] [phone] 08/17/2022 7:54 AM  I have independently interviewed and examined patient and agree with PA assessment and plan above.  Wound VAC to suction, cultures pending and will continue antibiotics through the weekend with plan for wound VAC change on Monday.  I discussed with the patient and family at bedside that this represents a significant problem after reviewing the CT scan and I did discuss the CT scan results with them.  Will need to wait on repairing the aortic aneurysm at this time given that it is 5.7 cm and in the 5 to 10% risk of rupture range over the next 365 days.  Initially needs to get over the infection the graft.  Ideally we can get the graft to heal and with granulation tissue by secondary intention.  Certainly if this is not feasible or if there are concerning organisms in the pus culture yesterday then we will need to consider alternative options which would first to be removal of the femorofemoral graft and possible revascularization of the left external iliac artery with noncovered stenting.  Either way treatment of the  aneurysm will have to wait until all infectious sources have been removed as to not introduce an infected graft into the aorta.  He also has only a left hypogastric artery feeding his pelvis so we will not be a candidate for aorto unit iliac device as we would have to coil embolized the left common iliac artery.  All this was discussed with the patient plan will be for wound VAC change Monday and continue antibiotics tailored to culture data.  Bransen Fassnacht C. Randie Heinz, MD [facility] [place] Office: [phone] Pager: [phone]

## 2022-08-18 LAB — CBC
HCT: 34.8 % — ABNORMAL LOW (ref 39.0–52.0)
Hemoglobin: 12.5 g/dL — ABNORMAL LOW (ref 13.0–17.0)
MCH: 31.6 pg (ref 26.0–34.0)
MCHC: 35.9 g/dL (ref 30.0–36.0)
MCV: 88.1 fL (ref 80.0–100.0)
Platelets: 183 10*3/uL (ref 150–400)
RBC: 3.95 MIL/uL — ABNORMAL LOW (ref 4.22–5.81)
RDW: 14 % (ref 11.5–15.5)
WBC: 6.7 10*3/uL (ref 4.0–10.5)
nRBC: 0 % (ref 0.0–0.2)

## 2022-08-18 NOTE — Progress Notes (Signed)
Mobility Specialist Progress Note   08/18/22 1504  Mobility  Activity Ambulated with assistance in hallway  Level of Assistance Contact guard assist, steadying assist  Assistive Device None  Distance Ambulated (ft) 540 ft  Activity Response Tolerated well  Mobility Referral Yes  $Mobility charge 1 Mobility  Mobility Specialist Start Time (ACUTE ONLY) 1450  Mobility Specialist Stop Time (ACUTE ONLY) 1503  Mobility Specialist Time Calculation (min) (ACUTE ONLY) 13 min   Received pt in chair having no complaints and agreeable to mobility. Pt was asymptomatic throughout ambulation and returned to room w/o fault. Left in chair  w/ call bell in reach and all needs met.  Frederico Hamman Mobility Specialist Please contact via SecureChat or  Rehab office at [phone]

## 2022-08-18 NOTE — Progress Notes (Signed)
  Progress Note    08/18/2022 9:00 AM 2 Days Post-Op  Subjective:  no complaints    Vitals:   08/18/22 0537 08/18/22 0812  BP: (!) 142/74 132/73  Pulse: (!) 46 (!) 53  Resp: 15 18  Temp:  98.4 F (36.9 C)  SpO2: 96% 100%    Physical Exam: General:  no acute distress Cardiac:  regular Lungs:  nonlabored Incisions:  lower abdominal wound vac was turned off an hour ago due to blockage issues. Lilypad and dressing were changed and wound vac now has good seal Extremities:  BLE warm and well perfused  CBC    Component Value Date/Time   WBC 6.7 08/18/2022 0151   RBC 3.95 (L) 08/18/2022 0151   HGB 12.5 (L) 08/18/2022 0151   HGB 13.9 10/27/2020 1038   HCT 34.8 (L) 08/18/2022 0151   HCT 41.0 10/27/2020 1038   PLT 183 08/18/2022 0151   PLT 208 10/27/2020 1038   MCV 88.1 08/18/2022 0151   MCV 90 10/27/2020 1038   MCH 31.6 08/18/2022 0151   MCHC 35.9 08/18/2022 0151   RDW 14.0 08/18/2022 0151   RDW 12.7 10/27/2020 1038   LYMPHSABS 2.0 10/10/2010 1100   MONOABS 0.8 10/10/2010 1100   EOSABS 0.3 10/10/2010 1100   BASOSABS 0.1 10/10/2010 1100    BMET    Component Value Date/Time   NA 133 (L) 08/17/2022 0952   NA 138 10/27/2020 1038   K 3.9 08/17/2022 0952   CL 103 08/17/2022 0952   CO2 23 08/17/2022 0952   GLUCOSE 88 08/17/2022 0952   BUN 25 (H) 08/17/2022 0952   BUN 17 10/27/2020 1038   CREATININE 1.13 08/17/2022 0952   CALCIUM 8.7 (L) 08/17/2022 0952   GFRNONAA >60 08/17/2022 0952   GFRAA >60 07/07/2019 0259    INR    Component Value Date/Time   INR 1.2 08/16/2022 0637     Intake/Output Summary (Last 24 hours) at 08/18/2022 0900 Last data filed at 08/18/2022 0813 Gross per 24 hour  Intake 733.27 ml  Output 2000 ml  Net -1266.73 ml      Assessment/Plan:  89 y.o. male is 2 days post op, s/p: excisional debridement of skin overlying fem-fem graft with wound vac placement    -No complaints. Feeling fairly restless. He understands he needs to stay and is  agreeable -Wound vac over lower abdominal incision was turned off around 7 this AM due to blockage issues. Dr.Robins changed the lilypad dressing and turned the wound vac back on and it has good seal with no blockage -Continue broad spectrum abx, cultures pending -Wound vac change on monday   Loel Dubonnet, PA-C [facility] [phone] 08/18/2022 9:00 AM

## 2022-08-18 NOTE — Progress Notes (Signed)
  Progress Note    08/18/2022 9:03 AM 2 Days Post-Op  Subjective:  no complaints, no pain   Vitals:   08/18/22 0537 08/18/22 0812  BP: (!) 142/74 132/73  Pulse: (!) 46 (!) 53  Resp: 15 18  Temp:  98.4 F (36.9 C)  SpO2: 96% 100%    Physical Exam: General:  resting comfortably  Cardiac:  regular Lungs:  nonlabored Incisions:  lower abdominal incision with wound vac  repaired - good seal, no blockage  Extremities:  BLE warm and well perfused  CBC    Component Value Date/Time   WBC 6.7 08/18/2022 0151   RBC 3.95 (L) 08/18/2022 0151   HGB 12.5 (L) 08/18/2022 0151   HGB 13.9 10/27/2020 1038   HCT 34.8 (L) 08/18/2022 0151   HCT 41.0 10/27/2020 1038   PLT 183 08/18/2022 0151   PLT 208 10/27/2020 1038   MCV 88.1 08/18/2022 0151   MCV 90 10/27/2020 1038   MCH 31.6 08/18/2022 0151   MCHC 35.9 08/18/2022 0151   RDW 14.0 08/18/2022 0151   RDW 12.7 10/27/2020 1038   LYMPHSABS 2.0 10/10/2010 1100   MONOABS 0.8 10/10/2010 1100   EOSABS 0.3 10/10/2010 1100   BASOSABS 0.1 10/10/2010 1100    BMET    Component Value Date/Time   NA 133 (L) 08/17/2022 0952   NA 138 10/27/2020 1038   K 3.9 08/17/2022 0952   CL 103 08/17/2022 0952   CO2 23 08/17/2022 0952   GLUCOSE 88 08/17/2022 0952   BUN 25 (H) 08/17/2022 0952   BUN 17 10/27/2020 1038   CREATININE 1.13 08/17/2022 0952   CALCIUM 8.7 (L) 08/17/2022 0952   GFRNONAA >60 08/17/2022 0952   GFRAA >60 07/07/2019 0259    INR    Component Value Date/Time   INR 1.2 08/16/2022 0637     Intake/Output Summary (Last 24 hours) at 08/18/2022 0903 Last data filed at 08/18/2022 0813 Gross per 24 hour  Intake 733.27 ml  Output 2000 ml  Net -1266.73 ml       Assessment/Plan:  89 y.o. male is 2 Days Post-Op s/p: excisional debridement of skin overlying fem-fem graft with wound vac placement   -Pain is well controlled this morning -Wound vac overlying lower abdomen incision repaired - good seal, no blockage -Currently on  broad spectrum abx. Cultures pending - NGTD -Plans for wound vac change on Monday -OOB as tolerated   Edward Sparrow MD [facility] [phone] 08/18/2022 9:03 AM

## 2022-08-18 NOTE — Progress Notes (Signed)
Patient's wound vac kept making noise thru the night with a "blockage' notice on the screen, after multiple troubleshooting, patient was getting anxious, and we turn the Wound vac. Off.

## 2022-08-19 ENCOUNTER — Inpatient Hospital Stay (HOSPITAL_COMMUNITY): Payer: Medicare Other

## 2022-08-19 LAB — CBC
HCT: 36.5 % — ABNORMAL LOW (ref 39.0–52.0)
Hemoglobin: 12.9 g/dL — ABNORMAL LOW (ref 13.0–17.0)
MCH: 30.8 pg (ref 26.0–34.0)
MCHC: 35.3 g/dL (ref 30.0–36.0)
MCV: 87.1 fL (ref 80.0–100.0)
Platelets: 193 10*3/uL (ref 150–400)
RBC: 4.19 MIL/uL — ABNORMAL LOW (ref 4.22–5.81)
RDW: 13.8 % (ref 11.5–15.5)
WBC: 6 10*3/uL (ref 4.0–10.5)
nRBC: 0 % (ref 0.0–0.2)

## 2022-08-19 MED ORDER — IOHEXOL 300 MG/ML  SOLN
75.0000 mL | Freq: Once | INTRAMUSCULAR | Status: AC | PRN
  Administered 2022-08-19: 75 mL via INTRAVENOUS

## 2022-08-19 NOTE — Progress Notes (Addendum)
  Progress Note    08/19/2022 7:34 AM 3 Days Post-Op  Subjective:  sleeping comfortably    Vitals:   08/18/22 2200 08/19/22 0609  BP: (!) 163/73 (!) 155/74  Pulse: (!) 57 (!) 55  Resp: 20   Temp: (!) 97.5 F (36.4 C) (!) 97.3 F (36.3 C)  SpO2: 97% 97%    Physical Exam: General:  no distress Cardiac:  regular  Lungs:  nonlabored Incisions:  lower abdominal incision with wound vac with good seal Extremities:  BLE warm and well perfused  CBC    Component Value Date/Time   WBC 6.0 08/19/2022 0158   RBC 4.19 (L) 08/19/2022 0158   HGB 12.9 (L) 08/19/2022 0158   HGB 13.9 10/27/2020 1038   HCT 36.5 (L) 08/19/2022 0158   HCT 41.0 10/27/2020 1038   PLT 193 08/19/2022 0158   PLT 208 10/27/2020 1038   MCV 87.1 08/19/2022 0158   MCV 90 10/27/2020 1038   MCH 30.8 08/19/2022 0158   MCHC 35.3 08/19/2022 0158   RDW 13.8 08/19/2022 0158   RDW 12.7 10/27/2020 1038   LYMPHSABS 2.0 10/10/2010 1100   MONOABS 0.8 10/10/2010 1100   EOSABS 0.3 10/10/2010 1100   BASOSABS 0.1 10/10/2010 1100    BMET    Component Value Date/Time   NA 133 (L) 08/17/2022 0952   NA 138 10/27/2020 1038   K 3.9 08/17/2022 0952   CL 103 08/17/2022 0952   CO2 23 08/17/2022 0952   GLUCOSE 88 08/17/2022 0952   BUN 25 (H) 08/17/2022 0952   BUN 17 10/27/2020 1038   CREATININE 1.13 08/17/2022 0952   CALCIUM 8.7 (L) 08/17/2022 0952   GFRNONAA >60 08/17/2022 0952   GFRAA >60 07/07/2019 0259    INR    Component Value Date/Time   INR 1.2 08/16/2022 0637     Intake/Output Summary (Last 24 hours) at 08/19/2022 0734 Last data filed at 08/19/2022 0415 Gross per 24 hour  Intake 600 ml  Output 1426 ml  Net -826 ml      Assessment/Plan:  89 y.o. male is 3 days post op, s/p: excisional debridement of skin overlying fem-fem graft with wound vac placement    -No events overnight. Mobilized in the hallway yesterday without issues -Lower abdominal incision with wound vac with good seal. Will plan for  change at bedside tomorrow -Wound cultures pending. On cefepime and vanc -OOB as tolerated -Will order CTA chest for Monday   [facility] [place] [facility] [phone] 08/19/2022 7:34 AM  VASCULAR STAFF ADDENDUM: I have independently interviewed and examined the patient. I agree with the above.  Theodoro Grist, MD [facility] Phone Number: [phone] 08/19/2022 4:29 PM

## 2022-08-19 NOTE — Progress Notes (Signed)
Radiology called and asked RN to inform MD that CT chest results are posted.  Will on call PA.

## 2022-08-19 NOTE — Progress Notes (Signed)
Pharmacy Antibiotic Note  Edward Campos is a 89 y.o. male admitted on 08/16/2022 with with  infected right to left femorofemoral bypass graft . Pt is s/p surgical I&D with wound VAC placement on 08/16/22. Pharmacy has been consulted for Cefepime and Vancomycin dosing. SCr 1.13 and CrCl 50 ml/min. WBC 6 and afebrile.   Plan: Continue Cefepime 2g IV q12h  Continue Vancomycin 1000mg  IV q24h (eAUC ~410)    > Goal AUC 400-550    > Check vancomycin levels at steady state  Monitor daily CBC, temp, SCr, and for clinical signs of improvement  F/u cultures and de-escalate antibiotics as able   Height: 5\' 10"  (177.8 cm) Weight: 77.6 kg (171 lb) IBW/kg (Calculated) : 73  Temp (24hrs), Avg:97.5 F (36.4 C), Min:97.3 F (36.3 C), Max:97.8 F (36.6 C)  Recent Labs  Lab 08/16/22 0637 08/17/22 0952 08/18/22 0151 08/19/22 0158  WBC 5.8 8.4 6.7 6.0  CREATININE 1.18 1.13  --   --     Estimated Creatinine Clearance: 47.6 mL/min (by C-G formula based on SCr of 1.13 mg/dL).    Allergies  Allergen Reactions   Penicillins Other (See Comments)    UNSPECIFIED REACTION  Did it involve swelling of the face/tongue/throat, SOB, or low BP? Unknown Did it involve sudden or severe rash/hives, skin peeling, or any reaction on the inside of your mouth or nose? Unknown Did you need to seek medical attention at a hospital or doctor's office? Unknown When did it last happen? Between ages 38-13   If all above answers are "NO", may proceed with cephalosporin use.    Antimicrobials this admission: Cefepime 6/6 >>  Vancomycin 6/6 >>   Microbiology results: 6/6 Wound Cx: Rare squamous epithelial cells 6/6 MRSA PCR: Negative  Thank you for allowing pharmacy to be a part of this patient's care.  Georga Hacking 08/19/2022 12:24 PM

## 2022-08-20 DIAGNOSIS — R918 Other nonspecific abnormal finding of lung field: Secondary | ICD-10-CM | POA: Diagnosis not present

## 2022-08-20 LAB — CBC
HCT: 37.1 % — ABNORMAL LOW (ref 39.0–52.0)
Hemoglobin: 12.9 g/dL — ABNORMAL LOW (ref 13.0–17.0)
MCH: 30.6 pg (ref 26.0–34.0)
MCHC: 34.8 g/dL (ref 30.0–36.0)
MCV: 87.9 fL (ref 80.0–100.0)
Platelets: 186 10*3/uL (ref 150–400)
RBC: 4.22 MIL/uL (ref 4.22–5.81)
RDW: 13.9 % (ref 11.5–15.5)
WBC: 6.4 10*3/uL (ref 4.0–10.5)
nRBC: 0 % (ref 0.0–0.2)

## 2022-08-20 NOTE — H&P (View-Only) (Signed)
 NAME:  Edward Campos, MRN:  [id number], DOB:  08/17/1932, LOS: 4 ADMISSION DATE:  08/16/2022, CONSULTATION DATE:  6/10 REFERRING MD:  caine, CHIEF COMPLAINT:  lung mass   History of Present Illness:  89-year-old male patient with fairly significant history of peripheral vascular disease followed by vein and vascular specialists here in [place].  Was seen in the office on 6/5 as a follow-up reporting a lump in his right groin.  This was ultimately felt to be an infection at his old surgical site.  He underwent a surgical debridement with wound VAC placement On 6/6.  During evaluation of fluid collection preoperatively a CT of chest and abdomen was found to have fairly large aortic abdominal aneurysm which was felt would need surgical repair however incidental The and this evaluation was also found to have a left lower lobe lung mass raising concern for bronchogenic carcinoma which was the reason for pulmonary consultation. Pertinent  Medical History  Prior right common and external iliac artery stenting with left femoral-femoral bypass Bilateral lower extremity claudication Abdominal aortic aneurysm BPH Anxiety Dizziness Hyperlipidemia Stopped smoking in 23  Significant Hospital Events: Including procedures, antibiotic start and stop dates in addition to other pertinent events   6/6 admitted, started on antibiotics, underwent incision and drainage of infected bypass graft site. 6/9 CT of  chest and abdomen done to further evaluate abdominal aortic aneurysm incidentally found 6 cm solid central left lower lobe lung mass 6/10 pulmonary consulted for lung mass Interim History / Subjective:  No distress Objective   Blood pressure (Abnormal) 147/77, pulse (Abnormal) 52, temperature 97.6 F (36.4 C), temperature source Oral, resp. rate 14, height 5' 10" (1.778 m), weight 77.6 kg, SpO2 97 %.        Intake/Output Summary (Last 24 hours) at 08/20/2022 0954 Last data filed at 08/20/2022  0347 Gross per 24 hour  Intake 240 ml  Output 850 ml  Net -610 ml   Filed Weights   08/16/22 0602  Weight: 77.6 kg    Examination: General: Otherwise healthy appearing 89-year-old male patient he is ambulatory currently and in no acute distress HENT: Cephalic atraumatic no jugular venous distention Lungs: Clear Cardiovascular: Regular rate and rhythm Abdomen: Soft nontender Extremities: Warm dry, VAC dressing currently off Neuro: Awake and oriented GU: Voids  Resolved Hospital Problem list     Assessment & Plan:  Peripheral vascular disease Status post incision and drainage of right femoral-femoral bypass graft infection Left lower lobe lung mass Prior smoker  Pulmonary problem list: Left lower lobe lung mass -Patient actually has a fairly good functional status.  Mass looks amendable to bronchoscopic biopsy  Plan Will plan for bronchoscopy with biopsy later in the week  Best Practice (right click and "Reselect all SmartList Selections" daily)   Per primary  Labs   CBC: Recent Labs  Lab 08/16/22 0637 08/17/22 0952 08/18/22 0151 08/19/22 0158 08/20/22 0152  WBC 5.8 8.4 6.7 6.0 6.4  HGB 13.7 13.2 12.5* 12.9* 12.9*  HCT 38.9* 38.2* 34.8* 36.5* 37.1*  MCV 87.6 88.6 88.1 87.1 87.9  PLT 199 199 183 193 186    Basic Metabolic Panel: Recent Labs  Lab 08/16/22 0637 08/17/22 0952  NA 133* 133*  K 4.4 3.9  CL 99 103  CO2 26 23  GLUCOSE 112* 88  BUN 25* 25*  CREATININE 1.18 1.13  CALCIUM 8.8* 8.7*   GFR: Estimated Creatinine Clearance: 47.6 mL/min (by C-G formula based on SCr of 1.13 mg/dL). Recent Labs  Lab   08/17/22 0952 08/18/22 0151 08/19/22 0158 08/20/22 0152  WBC 8.4 6.7 6.0 6.4    Liver Function Tests: Recent Labs  Lab 08/16/22 0637 08/17/22 0952  AST 29 23  ALT 27 22  ALKPHOS 79 78  BILITOT 1.6* 1.1  PROT 6.2* 6.1*  ALBUMIN 3.7 3.6   No results for input(s): "LIPASE", "AMYLASE" in the last 168 hours. No results for input(s):  "AMMONIA" in the last 168 hours.  ABG    Component Value Date/Time   TCO2 25 07/06/2019 0919     Coagulation Profile: Recent Labs  Lab 08/16/22 0637  INR 1.2    Cardiac Enzymes: No results for input(s): "CKTOTAL", "CKMB", "CKMBINDEX", "TROPONINI" in the last 168 hours.  HbA1C: Hgb A1c MFr Bld  Date/Time Value Ref Range Status  05/29/2019 02:50 PM 5.0 4.8 - 5.6 % Final    Comment:             Prediabetes: 5.7 - 6.4          Diabetes: >6.4          Glycemic control for adults with diabetes: <7.0     CBG: No results for input(s): "GLUCAP" in the last 168 hours.  Review of Systems:   Review of Systems  Constitutional:  Negative for fever and weight loss.  HENT: Negative.    Eyes: Negative.   Respiratory:  Positive for cough. Negative for shortness of breath.   Cardiovascular:  Positive for leg swelling. Negative for chest pain.  Gastrointestinal: Negative.   Genitourinary: Negative.   Musculoskeletal: Negative.   Skin: Negative.   Endo/Heme/Allergies: Negative.     Past Medical History:  He,  has a past medical history of AAA (abdominal aortic aneurysm) (HCC), Anxiety, Arthritis, BPH (benign prostatic hyperplasia), Dizziness, and Hyperlipidemia.   Surgical History:   Past Surgical History:  Procedure Laterality Date   ABDOMINAL AORTOGRAM W/LOWER EXTREMITY N/A 05/06/2019   Procedure: ABDOMINAL AORTOGRAM W/LOWER EXTREMITY;  Surgeon: Clark, Christopher J, MD;  Location: [facility];  Service: Cardiovascular;  Laterality: N/A;   APPLICATION OF WOUND VAC  07/06/2019   Procedure: Application Of Wound Vac Left Groin;  Surgeon: Fields, Charles E, MD;  Location: [facility] OR;  Service: Vascular;;   APPLICATION OF WOUND VAC N/A 08/16/2022   Procedure: APPLICATION OF WOUND VAC;  Surgeon: Cain, Brandon Christopher, MD;  Location: [facility] OR;  Service: Vascular;  Laterality: N/A;   CARDIOVERSION N/A 11/03/2020   Procedure: CARDIOVERSION;  Surgeon: Croitoru, Mihai, MD;  Location: [facility]  ENDOSCOPY;  Service: Cardiovascular;  Laterality: N/A;   ENDARTERECTOMY FEMORAL Bilateral 06/08/2019   Procedure: ENDARTERECTOMY RIGHT COMMON FEMORAL; ENDARTERECTOMY RIGHT EXTERNAL ILIAC; ENDARTERECTOMY LEFT COMMON FEMORAL; ENDARTERECTOMY LEFT EXTERNAL ILIAC;  Surgeon: Fields, Charles E, MD;  Location: [facility] OR;  Service: Vascular;  Laterality: Bilateral;   FEMORAL-FEMORAL BYPASS GRAFT Bilateral 06/08/2019   Procedure: BYPASS GRAFT FEMORAL-FEMORAL ARTERY;  Surgeon: Fields, Charles E, MD;  Location: [facility] OR;  Service: Vascular;  Laterality: Bilateral;   GROIN DEBRIDEMENT Left 07/06/2019   Procedure: INCISION AND DRAINAGE OF LEFT GROIN;  Surgeon: Fields, Charles E, MD;  Location: [facility] OR;  Service: Vascular;  Laterality: Left;   growth removed from chest     HEMORRHOID SURGERY     HERNIA REPAIR  12 yrs ago   left inguinal hernia   INCISE AND DRAIN ABCESS Left 07/06/2019   Incision and drainage left groin placement of VAC dressing   INCISION AND DRAINAGE OF WOUND Right 08/16/2022   Procedure: IRRIGATION AND   DEBRIDEMENT WOUND;  Surgeon: Cain, Brandon Christopher, MD;  Location: [facility] OR;  Service: Vascular;  Laterality: Right;   INGUINAL HERNIA REPAIR  10/16/2010   Procedure: HERNIA REPAIR INGUINAL ADULT;  Surgeon: Mark A Jenkins;  Location: [facility];  Service: General;  Laterality: Right;   INSERTION OF ILIAC STENT Right 06/08/2019   Procedure: INSERTION OF RIGHT COMMON ILIAC AND EXTERNAL ILICAC STENT;  Surgeon: Fields, Charles E, MD;  Location: [facility] OR;  Service: Vascular;  Laterality: Right;   PATCH ANGIOPLASTY Right 06/08/2019   Procedure: Patch Angioplasty Right Common Femoral;  Surgeon: Fields, Charles E, MD;  Location: [facility] OR;  Service: Vascular;  Laterality: Right;     Social History:   reports that he quit smoking about 3 years ago. His smoking use included cigarettes. He has a 17.50 pack-year smoking history. He has never used smokeless tobacco. He reports current alcohol use of about 3.0 standard drinks of  alcohol per week. He reports that he does not use drugs.   Family History:  His family history includes Stroke in his mother. There is no history of Pseudochol deficiency, Malignant hyperthermia, Hypotension, or Anesthesia problems.   Allergies Allergies  Allergen Reactions   Penicillins Other (See Comments)    UNSPECIFIED REACTION  Did it involve swelling of the face/tongue/throat, SOB, or low BP? Unknown Did it involve sudden or severe rash/hives, skin peeling, or any reaction on the inside of your mouth or nose? Unknown Did you need to seek medical attention at a hospital or doctor's office? Unknown When did it last happen? Between ages 10-13   If all above answers are "NO", may proceed with cephalosporin use.      Home Medications  Prior to Admission medications   Medication Sig Start Date End Date Taking? Authorizing Provider  aspirin EC 81 MG tablet Take 1 tablet (81 mg total) by mouth daily. Swallow whole. 06/30/21  Yes O'Neal, Niederwald Thomas, MD  diphenhydramine-acetaminophen (TYLENOL PM) 25-500 MG TABS tablet Take 1 tablet by mouth at bedtime as needed (Sleep).   Yes [provider]  ibuprofen (ADVIL) 200 MG tablet Take 400 mg by mouth every 6 (six) hours as needed.   Yes [provider]  Multiple Vitamins-Minerals (MULTIVITAMIN WITH MINERALS) tablet Take 1 tablet by mouth daily.   Yes [provider]  rosuvastatin (CRESTOR) 20 MG tablet TAKE 1 TABLET(20 MG) BY MOUTH DAILY 07/09/22  Yes Cain, Brandon Christopher, MD  Simethicone (GAS RELIEF EXTRA STRENGTH PO) Take 2 tablets by mouth daily as needed (gas).   Yes [provider]     Critical care time: NA    Lyrik Buresh E Dia Donate ACNP-BC [place] [facility] Pager # [phone] OR # [phone] if no answer     

## 2022-08-20 NOTE — Progress Notes (Addendum)
  Progress Note    08/20/2022 7:54 AM 4 Days Post-Op  Subjective:  no complaints    Vitals:   08/19/22 2346 08/20/22 0358  BP: (!) 148/88 (!) 147/77  Pulse: 68 (!) 52  Resp: 14 14  Temp: (!) 97.5 F (36.4 C) 97.6 F (36.4 C)  SpO2: 96% 97%    Physical Exam: General:  resting comfortably Cardiac:  regular Lungs:  nonlabored Incisions:  lower abdominal incision with wound vac with good seal Extremities:  BLE warm and well perfused   CBC    Component Value Date/Time   WBC 6.4 08/20/2022 0152   RBC 4.22 08/20/2022 0152   HGB 12.9 (L) 08/20/2022 0152   HGB 13.9 10/27/2020 1038   HCT 37.1 (L) 08/20/2022 0152   HCT 41.0 10/27/2020 1038   PLT 186 08/20/2022 0152   PLT 208 10/27/2020 1038   MCV 87.9 08/20/2022 0152   MCV 90 10/27/2020 1038   MCH 30.6 08/20/2022 0152   MCHC 34.8 08/20/2022 0152   RDW 13.9 08/20/2022 0152   RDW 12.7 10/27/2020 1038   LYMPHSABS 2.0 10/10/2010 1100   MONOABS 0.8 10/10/2010 1100   EOSABS 0.3 10/10/2010 1100   BASOSABS 0.1 10/10/2010 1100    BMET    Component Value Date/Time   NA 133 (L) 08/17/2022 0952   NA 138 10/27/2020 1038   K 3.9 08/17/2022 0952   CL 103 08/17/2022 0952   CO2 23 08/17/2022 0952   GLUCOSE 88 08/17/2022 0952   BUN 25 (H) 08/17/2022 0952   BUN 17 10/27/2020 1038   CREATININE 1.13 08/17/2022 0952   CALCIUM 8.7 (L) 08/17/2022 0952   GFRNONAA >60 08/17/2022 0952   GFRAA >60 07/07/2019 0259    INR    Component Value Date/Time   INR 1.2 08/16/2022 0637     Intake/Output Summary (Last 24 hours) at 08/20/2022 0754 Last data filed at 08/20/2022 0347 Gross per 24 hour  Intake 480 ml  Output 850 ml  Net -370 ml      Assessment/Plan:  89 y.o. male is 4 days post op, s/p: excisional debridement of skin overlying fem-fem graft with wound vac placement    -Feeling fine this morning. No issues with wound vac since lilypad change on Saturday -Wound vac has a good seal this morning. Will change at bedside  with Dr.Kacie Huxtable today -Wound cultures show no growth so far.Currently on broad spec abx -CTA chest demonstrated concerning left lower lobe lung mass measuring 6.0 x 4.7cm, concerning for bronchogenic carcinoma. Will consult pulmonary for further management   Loel Dubonnet, PA-C [facility] [phone] 08/20/2022 7:54 AM    I have interviewed and examined patient with PA and agree with assessment and plan above.  Pulmonary consulted input much appreciated.  Graft appears clean and cultures have not returned any growth to date.  As such we will continue antibiotics and will need to return trip to the OR for placement of further for skin with likely wound VAC destination therapy for the interim given other medical issues.  I discussed this with the patient he demonstrates good understanding.  Will also need to hold on any consideration of aneurysm repair.  Atheena Spano C. Randie Heinz, MD [facility] [place] Office: [phone] Pager: [phone]

## 2022-08-20 NOTE — Progress Notes (Signed)
Wound vac:  Pt reported accidentally pulling wound vac and disconnected lilly pad from dressing.   Ordered and replaced lilly pad w/o difficulty.

## 2022-08-20 NOTE — Consult Note (Signed)
NAME:  Edward Campos, MRN:  [id number], DOB:  Oct 22, 1932, LOS: 4 ADMISSION DATE:  08/16/2022, CONSULTATION DATE:  6/10 REFERRING MD:  caine, CHIEF COMPLAINT:  lung mass   History of Present Illness:  89 year old male patient with fairly significant history of peripheral vascular disease followed by vein and vascular specialists here in [place].  Was seen in the office on 6/5 as a follow-up reporting a lump in his right groin.  This was ultimately felt to be an infection at his old surgical site.  He underwent a surgical debridement with wound VAC placement On 6/6.  During evaluation of fluid collection preoperatively a CT of chest and abdomen was found to have fairly large aortic abdominal aneurysm which was felt would need surgical repair however incidental The and this evaluation was also found to have a left lower lobe lung mass raising concern for bronchogenic carcinoma which was the reason for pulmonary consultation. Pertinent  Medical History  Prior right common and external iliac artery stenting with left femoral-femoral bypass Bilateral lower extremity claudication Abdominal aortic aneurysm BPH Anxiety Dizziness Hyperlipidemia Stopped smoking in 23  Significant Hospital Events: Including procedures, antibiotic start and stop dates in addition to other pertinent events   6/6 admitted, started on antibiotics, underwent incision and drainage of infected bypass graft site. 6/9 CT of  chest and abdomen done to further evaluate abdominal aortic aneurysm incidentally found 6 cm solid central left lower lobe lung mass 6/10 pulmonary consulted for lung mass Interim History / Subjective:  No distress Objective   Blood pressure (Abnormal) 147/77, pulse (Abnormal) 52, temperature 97.6 F (36.4 C), temperature source Oral, resp. rate 14, height 5\' 10"  (1.778 m), weight 77.6 kg, SpO2 97 %.        Intake/Output Summary (Last 24 hours) at 08/20/2022 0954 Last data filed at 08/20/2022  0347 Gross per 24 hour  Intake 240 ml  Output 850 ml  Net -610 ml   Filed Weights   08/16/22 0602  Weight: 77.6 kg    Examination: General: Otherwise healthy appearing 89 year old male patient he is ambulatory currently and in no acute distress HENT: Cephalic atraumatic no jugular venous distention Lungs: Clear Cardiovascular: Regular rate and rhythm Abdomen: Soft nontender Extremities: Warm dry, VAC dressing currently off Neuro: Awake and oriented GU: Voids  Resolved Hospital Problem list     Assessment & Plan:  Peripheral vascular disease Status post incision and drainage of right femoral-femoral bypass graft infection Left lower lobe lung mass Prior smoker  Pulmonary problem list: Left lower lobe lung mass -Patient actually has a fairly good functional status.  Mass looks amendable to bronchoscopic biopsy  Plan Will plan for bronchoscopy with biopsy later in the week  Best Practice (right click and "Reselect all SmartList Selections" daily)   Per primary  Labs   CBC: Recent Labs  Lab 08/16/22 0637 08/17/22 0952 08/18/22 0151 08/19/22 0158 08/20/22 0152  WBC 5.8 8.4 6.7 6.0 6.4  HGB 13.7 13.2 12.5* 12.9* 12.9*  HCT 38.9* 38.2* 34.8* 36.5* 37.1*  MCV 87.6 88.6 88.1 87.1 87.9  PLT 199 199 183 193 186    Basic Metabolic Panel: Recent Labs  Lab 08/16/22 0637 08/17/22 0952  NA 133* 133*  K 4.4 3.9  CL 99 103  CO2 26 23  GLUCOSE 112* 88  BUN 25* 25*  CREATININE 1.18 1.13  CALCIUM 8.8* 8.7*   GFR: Estimated Creatinine Clearance: 47.6 mL/min (by C-G formula based on SCr of 1.13 mg/dL). Recent Labs  Lab  08/17/22 0952 08/18/22 0151 08/19/22 0158 08/20/22 0152  WBC 8.4 6.7 6.0 6.4    Liver Function Tests: Recent Labs  Lab 08/16/22 0637 08/17/22 0952  AST 29 23  ALT 27 22  ALKPHOS 79 78  BILITOT 1.6* 1.1  PROT 6.2* 6.1*  ALBUMIN 3.7 3.6   No results for input(s): "LIPASE", "AMYLASE" in the last 168 hours. No results for input(s):  "AMMONIA" in the last 168 hours.  ABG    Component Value Date/Time   TCO2 25 07/06/2019 0919     Coagulation Profile: Recent Labs  Lab 08/16/22 0637  INR 1.2    Cardiac Enzymes: No results for input(s): "CKTOTAL", "CKMB", "CKMBINDEX", "TROPONINI" in the last 168 hours.  HbA1C: Hgb A1c MFr Bld  Date/Time Value Ref Range Status  05/29/2019 02:50 PM 5.0 4.8 - 5.6 % Final    Comment:             Prediabetes: 5.7 - 6.4          Diabetes: >6.4          Glycemic control for adults with diabetes: <7.0     CBG: No results for input(s): "GLUCAP" in the last 168 hours.  Review of Systems:   Review of Systems  Constitutional:  Negative for fever and weight loss.  HENT: Negative.    Eyes: Negative.   Respiratory:  Positive for cough. Negative for shortness of breath.   Cardiovascular:  Positive for leg swelling. Negative for chest pain.  Gastrointestinal: Negative.   Genitourinary: Negative.   Musculoskeletal: Negative.   Skin: Negative.   Endo/Heme/Allergies: Negative.     Past Medical History:  He,  has a past medical history of AAA (abdominal aortic aneurysm) (HCC), Anxiety, Arthritis, BPH (benign prostatic hyperplasia), Dizziness, and Hyperlipidemia.   Surgical History:   Past Surgical History:  Procedure Laterality Date   ABDOMINAL AORTOGRAM W/LOWER EXTREMITY N/A 05/06/2019   Procedure: ABDOMINAL AORTOGRAM W/LOWER EXTREMITY;  Surgeon: Cephus Shelling, MD;  Location: [facility] INVASIVE CV LAB;  Service: Cardiovascular;  Laterality: N/A;   APPLICATION OF WOUND VAC  07/06/2019   Procedure: Application Of Wound Vac Left Groin;  Surgeon: Sherren Kerns, MD;  Location: [facility] OR;  Service: Vascular;;   APPLICATION OF WOUND VAC N/A 08/16/2022   Procedure: APPLICATION OF WOUND VAC;  Surgeon: [name], MD;  Location: [facility] OR;  Service: Vascular;  Laterality: N/A;   CARDIOVERSION N/A 11/03/2020   Procedure: CARDIOVERSION;  Surgeon: Thurmon Fair, MD;  Location: [facility]  ENDOSCOPY;  Service: Cardiovascular;  Laterality: N/A;   ENDARTERECTOMY FEMORAL Bilateral 06/08/2019   Procedure: ENDARTERECTOMY RIGHT COMMON FEMORAL; ENDARTERECTOMY RIGHT EXTERNAL ILIAC; ENDARTERECTOMY LEFT COMMON FEMORAL; ENDARTERECTOMY LEFT EXTERNAL ILIAC;  Surgeon: Sherren Kerns, MD;  Location: [facility] OR;  Service: Vascular;  Laterality: Bilateral;   FEMORAL-FEMORAL BYPASS GRAFT Bilateral 06/08/2019   Procedure: BYPASS GRAFT FEMORAL-FEMORAL ARTERY;  Surgeon: Sherren Kerns, MD;  Location: [facility] OR;  Service: Vascular;  Laterality: Bilateral;   GROIN DEBRIDEMENT Left 07/06/2019   Procedure: INCISION AND DRAINAGE OF LEFT GROIN;  Surgeon: Sherren Kerns, MD;  Location: [facility] OR;  Service: Vascular;  Laterality: Left;   growth removed from chest     HEMORRHOID SURGERY     HERNIA REPAIR  12 yrs ago   left inguinal hernia   INCISE AND DRAIN ABCESS Left 07/06/2019   Incision and drainage left groin placement of VAC dressing   INCISION AND DRAINAGE OF WOUND Right 08/16/2022   Procedure: IRRIGATION AND  DEBRIDEMENT WOUND;  Surgeon: [name], MD;  Location: [facility] OR;  Service: Vascular;  Laterality: Right;   INGUINAL HERNIA REPAIR  10/16/2010   Procedure: HERNIA REPAIR INGUINAL ADULT;  Surgeon: Dalia Heading;  Location: [facility] ORS;  Service: General;  Laterality: Right;   INSERTION OF ILIAC STENT Right 06/08/2019   Procedure: INSERTION OF RIGHT COMMON ILIAC AND EXTERNAL ILICAC STENT;  Surgeon: Sherren Kerns, MD;  Location: [facility] OR;  Service: Vascular;  Laterality: Right;   PATCH ANGIOPLASTY Right 06/08/2019   Procedure: Patch Angioplasty Right Common Femoral;  Surgeon: Sherren Kerns, MD;  Location: [facility] OR;  Service: Vascular;  Laterality: Right;     Social History:   reports that he quit smoking about 3 years ago. His smoking use included cigarettes. He has a 17.50 pack-year smoking history. He has never used smokeless tobacco. He reports current alcohol use of about 3.0 standard drinks of  alcohol per week. He reports that he does not use drugs.   Family History:  His family history includes Stroke in his mother. There is no history of Pseudochol deficiency, Malignant hyperthermia, Hypotension, or Anesthesia problems.   Allergies Allergies  Allergen Reactions   Penicillins Other (See Comments)    UNSPECIFIED REACTION  Did it involve swelling of the face/tongue/throat, SOB, or low BP? Unknown Did it involve sudden or severe rash/hives, skin peeling, or any reaction on the inside of your mouth or nose? Unknown Did you need to seek medical attention at a hospital or doctor's office? Unknown When did it last happen? Between ages [age]   If all above answers are "NO", may proceed with cephalosporin use.      Home Medications  Prior to Admission medications   Medication Sig Start Date End Date Taking? Authorizing Provider  aspirin EC 81 MG tablet Take 1 tablet (81 mg total) by mouth daily. Swallow whole. 06/30/21  Yes O'Neal, Ronnald Ramp, MD  diphenhydramine-acetaminophen (TYLENOL PM) 25-500 MG TABS tablet Take 1 tablet by mouth at bedtime as needed (Sleep).   Yes [provider]  ibuprofen (ADVIL) 200 MG tablet Take 400 mg by mouth every 6 (six) hours as needed.   Yes [provider]  Multiple Vitamins-Minerals (MULTIVITAMIN WITH MINERALS) tablet Take 1 tablet by mouth daily.   Yes [provider]  rosuvastatin (CRESTOR) 20 MG tablet TAKE 1 TABLET(20 MG) BY MOUTH DAILY 07/09/22  Yes [name], MD  Simethicone (GAS RELIEF EXTRA STRENGTH PO) Take 2 tablets by mouth daily as needed (gas).   Yes [provider]     Critical care time: NA    Simonne Martinet ACNP-BC [facility] Pager # [phone] OR # [phone] if no answer

## 2022-08-21 ENCOUNTER — Inpatient Hospital Stay (HOSPITAL_COMMUNITY): Payer: Medicare Other | Admitting: Anesthesiology

## 2022-08-21 ENCOUNTER — Encounter (HOSPITAL_COMMUNITY): Payer: Self-pay | Admitting: Vascular Surgery

## 2022-08-21 ENCOUNTER — Encounter (HOSPITAL_COMMUNITY)
Admission: RE | Disposition: A | Discharge status: Home / Self Care | Payer: Self-pay | Source: Home / Self Care | Attending: Vascular Surgery

## 2022-08-21 DIAGNOSIS — R918 Other nonspecific abnormal finding of lung field: Secondary | ICD-10-CM | POA: Diagnosis not present

## 2022-08-21 DIAGNOSIS — I739 Peripheral vascular disease, unspecified: Secondary | ICD-10-CM

## 2022-08-21 DIAGNOSIS — F419 Anxiety disorder, unspecified: Secondary | ICD-10-CM

## 2022-08-21 DIAGNOSIS — Z87891 Personal history of nicotine dependence: Secondary | ICD-10-CM

## 2022-08-21 DIAGNOSIS — C3432 Malignant neoplasm of lower lobe, left bronchus or lung: Secondary | ICD-10-CM

## 2022-08-21 HISTORY — PX: BRONCHIAL BRUSHINGS: SHX5108

## 2022-08-21 HISTORY — PX: BRONCHIAL BIOPSY: SHX5109

## 2022-08-21 HISTORY — PX: BRONCHIAL WASHINGS: SHX5105

## 2022-08-21 HISTORY — PX: VIDEO BRONCHOSCOPY: SHX5072

## 2022-08-21 HISTORY — PX: HEMOSTASIS CONTROL: SHX6838

## 2022-08-21 LAB — AEROBIC/ANAEROBIC CULTURE W GRAM STAIN (SURGICAL/DEEP WOUND): Culture: NO GROWTH

## 2022-08-21 SURGERY — BRONCHOSCOPY, WITH FLUOROSCOPY
Anesthesia: General

## 2022-08-21 MED ORDER — FENTANYL CITRATE (PF) 100 MCG/2ML IJ SOLN
INTRAMUSCULAR | Status: DC | PRN
  Administered 2022-08-21: 100 ug via INTRAVENOUS

## 2022-08-21 MED ORDER — LACTATED RINGERS IV SOLN: INTRAVENOUS | Status: DC

## 2022-08-21 MED ORDER — PHENYLEPHRINE 80 MCG/ML (10ML) SYRINGE FOR IV PUSH (FOR BLOOD PRESSURE SUPPORT)
PREFILLED_SYRINGE | INTRAVENOUS | Status: DC | PRN
  Administered 2022-08-21 (×2): 160 ug via INTRAVENOUS

## 2022-08-21 MED ORDER — FENTANYL CITRATE (PF) 250 MCG/5ML IJ SOLN: INTRAMUSCULAR | Status: DC | PRN

## 2022-08-21 MED ORDER — AMISULPRIDE (ANTIEMETIC) 5 MG/2ML IV SOLN: 10.0000 mg | Freq: Once | INTRAVENOUS | Status: DC | PRN

## 2022-08-21 MED ORDER — PROPOFOL 10 MG/ML IV BOLUS
INTRAVENOUS | Status: DC | PRN
  Administered 2022-08-21: 150 mg via INTRAVENOUS

## 2022-08-21 MED ORDER — SUCCINYLCHOLINE CHLORIDE 200 MG/10ML IV SOSY
PREFILLED_SYRINGE | INTRAVENOUS | Status: DC | PRN
  Administered 2022-08-21: 120 mg via INTRAVENOUS

## 2022-08-21 MED ORDER — DEXAMETHASONE SODIUM PHOSPHATE 10 MG/ML IJ SOLN
INTRAMUSCULAR | Status: DC | PRN
  Administered 2022-08-21: 10 mg via INTRAVENOUS

## 2022-08-21 MED ORDER — OXYCODONE HCL 5 MG PO TABS: 5.0000 mg | ORAL_TABLET | Freq: Once | ORAL | Status: DC | PRN

## 2022-08-21 MED ORDER — PROMETHAZINE HCL 25 MG/ML IJ SOLN: 6.2500 mg | INTRAMUSCULAR | Status: DC | PRN

## 2022-08-21 MED ORDER — HYDROMORPHONE HCL 1 MG/ML IJ SOLN: 0.2500 mg | INTRAMUSCULAR | Status: DC | PRN

## 2022-08-21 MED ORDER — FENTANYL CITRATE (PF) 100 MCG/2ML IJ SOLN
INTRAMUSCULAR | Status: AC
  Filled 2022-08-21: qty 2

## 2022-08-21 MED ORDER — PHENYLEPHRINE HCL-NACL 20-0.9 MG/250ML-% IV SOLN
INTRAVENOUS | Status: DC | PRN
  Administered 2022-08-21: 40 ug/min via INTRAVENOUS

## 2022-08-21 MED ORDER — OXYCODONE HCL 5 MG/5ML PO SOLN: 5.0000 mg | Freq: Once | ORAL | Status: DC | PRN

## 2022-08-21 MED ORDER — PROPOFOL 500 MG/50ML IV EMUL
INTRAVENOUS | Status: DC | PRN
  Administered 2022-08-21: 150 ug/kg/min via INTRAVENOUS

## 2022-08-21 MED ORDER — ONDANSETRON HCL 4 MG/2ML IJ SOLN
INTRAMUSCULAR | Status: DC | PRN
  Administered 2022-08-21: 4 mg via INTRAVENOUS

## 2022-08-21 MED ORDER — LIDOCAINE 2% (20 MG/ML) 5 ML SYRINGE
INTRAMUSCULAR | Status: DC | PRN
  Administered 2022-08-21: 80 mg via INTRAVENOUS

## 2022-08-21 NOTE — Progress Notes (Signed)
Mobility Specialist: Progress Note   08/21/22 1432  Mobility  Activity Ambulated with assistance in hallway  Level of Assistance Contact guard assist, steadying assist  Assistive Device Front wheel walker  Distance Ambulated (ft) 800 ft  Activity Response Tolerated well  Mobility Referral Yes  $Mobility charge 1 Mobility  Mobility Specialist Start Time (ACUTE ONLY) 1411  Mobility Specialist Stop Time (ACUTE ONLY) 1425  Mobility Specialist Time Calculation (min) (ACUTE ONLY) 14 min   Pre-Mobility: 78 HR Post-Mobility: 74 HR  Pt received in the bed and agreeable to mobility. Mod I with bed mobility and contact guard during ambulation. No c/o throughout. Pt back to bed after session with call bell and phone in reach.   Joretta Eads Mobility Specialist Please contact via SecureChat or Rehab office at [phone]

## 2022-08-21 NOTE — Progress Notes (Signed)
Pt came back to rm 4 from Endo. Reinitiated tele. Call bell within reach.   Lawson Radar, RN

## 2022-08-21 NOTE — Interval H&P Note (Signed)
History and Physical Interval Note:  08/21/2022 9:49 AM  Edward Campos  has presented today for surgery, with the diagnosis of lung mass.  The various methods of treatment have been discussed with the patient and family. After consideration of risks, benefits and other options for treatment, the patient has consented to  Procedure(s): VIDEO BRONCHOSCOPY WITH FLUORO (N/A) as a surgical intervention.  The patient's history has been reviewed, patient examined, no change in status, stable for surgery.  I have reviewed the patient's chart and labs.  Questions were answered to the patient's satisfaction.     Comer Locket Vassie Loll

## 2022-08-21 NOTE — Anesthesia Preprocedure Evaluation (Signed)
Anesthesia Evaluation  Patient identified by MRN, date of birth, ID band Patient awake    Reviewed: Allergy & Precautions, H&P , NPO status , Patient's Chart, lab work & pertinent test results  Airway Mallampati: II  TM Distance: >3 FB Neck ROM: Full    Dental no notable dental hx. (+) Teeth Intact, Dental Advisory Given   Pulmonary neg pulmonary ROS, former smoker   Pulmonary exam normal breath sounds clear to auscultation       Cardiovascular Exercise Tolerance: Good + Peripheral Vascular Disease   Rhythm:Regular Rate:Normal     Neuro/Psych   Anxiety     negative neurological ROS     GI/Hepatic negative GI ROS, Neg liver ROS,,,  Endo/Other  negative endocrine ROS    Renal/GU negative Renal ROS  negative genitourinary   Musculoskeletal  (+) Arthritis , Osteoarthritis,    Abdominal   Peds  Hematology negative hematology ROS (+)   Anesthesia Other Findings   Reproductive/Obstetrics negative OB ROS                             Anesthesia Physical Anesthesia Plan  ASA: 3  Anesthesia Plan: General   Post-op Pain Management: Minimal or no pain anticipated   Induction: Intravenous  PONV Risk Score and Plan: 2 and Ondansetron, Treatment may vary due to age or medical condition and Midazolam  Airway Management Planned: Oral ETT  Additional Equipment:   Intra-op Plan:   Post-operative Plan: Extubation in OR  Informed Consent: I have reviewed the patients History and Physical, chart, labs and discussed the procedure including the risks, benefits and alternatives for the proposed anesthesia with the patient or authorized representative who has indicated his/her understanding and acceptance.     Dental advisory given  Plan Discussed with: CRNA  Anesthesia Plan Comments: (PAT note written 08/15/2022 by Shonna Chock, PA-C.  )       Anesthesia Quick Evaluation

## 2022-08-21 NOTE — Anesthesia Procedure Notes (Signed)
Procedure Name: Intubation Date/Time: 08/21/2022 10:05 AM  Performed by: Randon Goldsmith, CRNAPre-anesthesia Checklist: Patient identified, Emergency Drugs available, Suction available and Patient being monitored Patient Re-evaluated:Patient Re-evaluated prior to induction Oxygen Delivery Method: Circle system utilized Preoxygenation: Pre-oxygenation with 100% oxygen Induction Type: IV induction Ventilation: Mask ventilation without difficulty Laryngoscope Size: Glidescope and 4 Grade View: Grade I Tube type: Oral Tube size: 8.5 mm Number of attempts: 1 Airway Equipment and Method: Stylet and Oral airway Placement Confirmation: ETT inserted through vocal cords under direct vision, positive ETCO2 and breath sounds checked- equal and bilateral Secured at: 22 cm Tube secured with: Tape Dental Injury: Teeth and Oropharynx as per pre-operative assessment  Comments: Performed by Dr. Reginia Naas medical student Amalia Hailey. CRNA and MDA immediately available.

## 2022-08-21 NOTE — Transfer of Care (Signed)
Immediate Anesthesia Transfer of Care Note  Patient: Edward Campos  Procedure(s) Performed: VIDEO BRONCHOSCOPY WITH FLUORO BRONCHIAL WASHINGS BRONCHIAL BRUSHINGS BRONCHIAL BIOPSIES HEMOSTASIS CONTROL  Patient Location: Endoscopy Unit  Anesthesia Type:General  Level of Consciousness: drowsy  Airway & Oxygen Therapy: Patient Spontanous Breathing and Patient connected to face mask oxygen  Post-op Assessment: Report given to RN and Post -op Vital signs reviewed and stable  Post vital signs: Reviewed and stable  Last Vitals:  Vitals Value Taken Time  BP 107/51   Temp    Pulse 52 08/21/22 1057  Resp 24 08/21/22 1057  SpO2 99 % 08/21/22 1057  Vitals shown include unvalidated device data.  Last Pain:  Vitals:   08/21/22 0922  TempSrc: Temporal  PainSc: 0-No pain      Patients Stated Pain Goal: 0 (08/16/22 0644)  Complications: No notable events documented.

## 2022-08-21 NOTE — Progress Notes (Signed)
Administered 5 mg IV hydralazine for sbp=177. Unable to re-check bp due to pt going to endo .  Lawson Radar, RN

## 2022-08-21 NOTE — Op Note (Signed)
Indication : LLL mass in this smoker with infected fem-fem bypass wound  Written informed consent was obtained prior to the procedure. The risks of the procedure including coughing, bleeding and the small chance of lung puncture requiring chest tube were discussed in great detail. The benefits & alternatives including serial follow up were also discussed.  General anesthesia was induced by CRNA Bronchoscope entered from the ETT Trachea & bronchial tree examined to the subsegmental level. Mild amount of white secretions were noted. LLL bronchus was narrowed by endobronchial lesion coming extrinsically & could not be traversed. BAL & brushings were obtained from the LLL.  Endobronchial biopsies x 4 were obtained from the LLL under vision Hemostasis achieved by ice cold saline   Specimens : BAL for cytology Brushings for cytology Biopsy for surg path   Edward Campos V.  [phone]

## 2022-08-21 NOTE — Care Management Important Message (Signed)
Important Message  Patient Details  Name: Edward Campos MRN: [id number] Date of Birth: [birth year]   Medicare Important Message Given:  Yes     Edward Campos [name] 08/21/2022, 2:58 PM

## 2022-08-21 NOTE — Progress Notes (Addendum)
  Progress Note    08/21/2022 7:48 AM 5 Days Post-Op  Subjective:  no complaints    Vitals:   08/21/22 0002 08/21/22 0441  BP: (!) 144/66 (!) 159/69  Pulse: (!) 52 (!) 51  Resp: 15 17  Temp: 98 F (36.7 C) 97.9 F (36.6 C)  SpO2: 97% 96%    Physical Exam: General:  resting comfortably Cardiac:  regular Lungs:  nonlabored Incisions:  lower abdominal incision with wound vac with good seal Extremities:  BLE warm  CBC    Component Value Date/Time   WBC 6.4 08/20/2022 0152   RBC 4.22 08/20/2022 0152   HGB 12.9 (L) 08/20/2022 0152   HGB 13.9 10/27/2020 1038   HCT 37.1 (L) 08/20/2022 0152   HCT 41.0 10/27/2020 1038   PLT 186 08/20/2022 0152   PLT 208 10/27/2020 1038   MCV 87.9 08/20/2022 0152   MCV 90 10/27/2020 1038   MCH 30.6 08/20/2022 0152   MCHC 34.8 08/20/2022 0152   RDW 13.9 08/20/2022 0152   RDW 12.7 10/27/2020 1038   LYMPHSABS 2.0 10/10/2010 1100   MONOABS 0.8 10/10/2010 1100   EOSABS 0.3 10/10/2010 1100   BASOSABS 0.1 10/10/2010 1100    BMET    Component Value Date/Time   NA 133 (L) 08/17/2022 0952   NA 138 10/27/2020 1038   K 3.9 08/17/2022 0952   CL 103 08/17/2022 0952   CO2 23 08/17/2022 0952   GLUCOSE 88 08/17/2022 0952   BUN 25 (H) 08/17/2022 0952   BUN 17 10/27/2020 1038   CREATININE 1.13 08/17/2022 0952   CALCIUM 8.7 (L) 08/17/2022 0952   GFRNONAA >60 08/17/2022 0952   GFRAA >60 07/07/2019 0259    INR    Component Value Date/Time   INR 1.2 08/16/2022 0637     Intake/Output Summary (Last 24 hours) at 08/21/2022 0748 Last data filed at 08/21/2022 0538 Gross per 24 hour  Intake 1197.08 ml  Output --  Net 1197.08 ml      Assessment/Plan:  89 y.o. male is 5 days post op, s/p: excisional debridement of skin overlying fem-fem graft with wound vac placement   -Wound vac was changed yesterday and the graft appeared clean. Wound vac this morning has a good seal. Plan is to take patient back to the OR sometime this week for skin  substitute and wound vac change -Pulmonary has seen the patient for his left lung mass and plans for bronchoscopic biopsy today   Loel Dubonnet, PA-C [facility] [phone] 08/21/2022 7:48 AM  I have independently interviewed and examined patient and agree with PA assessment and plan above.  Status post biopsy earlier today.  Plan to return to the OR tomorrow for washout and placement of his skin with wound VAC which will be in place for at least 1 week and hopefully can begin to get some granulation over the graft given that the cultures were negative.  I discussed with the patient and he will be n.p.o. past midnight.  Deaven Barron C. Randie Heinz, MD [facility] [place] Office: [phone] Pager: [phone]

## 2022-08-21 NOTE — Anesthesia Postprocedure Evaluation (Signed)
Anesthesia Post Note  Patient: Edward Campos  Procedure(s) Performed: VIDEO BRONCHOSCOPY WITH FLUORO BRONCHIAL WASHINGS BRONCHIAL BRUSHINGS BRONCHIAL BIOPSIES HEMOSTASIS CONTROL     Patient location during evaluation: PACU Anesthesia Type: General Level of consciousness: awake and alert Pain management: pain level controlled Vital Signs Assessment: post-procedure vital signs reviewed and stable Respiratory status: spontaneous breathing, nonlabored ventilation and respiratory function stable Cardiovascular status: blood pressure returned to baseline and stable Postop Assessment: no apparent nausea or vomiting Anesthetic complications: no   No notable events documented.  Last Vitals:  Vitals:   08/21/22 1120 08/21/22 1156  BP: 126/66 138/72  Pulse: (!) 59 (!) 57  Resp: 18 17  Temp:  36.7 C  SpO2: 100% 97%    Last Pain:  Vitals:   08/21/22 1156  TempSrc: Oral  PainSc: 0-No pain                 Lowella Curb

## 2022-08-22 ENCOUNTER — Encounter (HOSPITAL_COMMUNITY): Payer: Self-pay | Admitting: Vascular Surgery

## 2022-08-22 ENCOUNTER — Encounter (HOSPITAL_COMMUNITY)
Admission: RE | Disposition: A | Discharge status: Home / Self Care | Payer: Self-pay | Source: Home / Self Care | Attending: Vascular Surgery

## 2022-08-22 ENCOUNTER — Other Ambulatory Visit: Payer: Self-pay

## 2022-08-22 ENCOUNTER — Inpatient Hospital Stay (HOSPITAL_COMMUNITY): Payer: Medicare Other | Admitting: Certified Registered"

## 2022-08-22 DIAGNOSIS — T8149XA Infection following a procedure, other surgical site, initial encounter: Secondary | ICD-10-CM

## 2022-08-22 DIAGNOSIS — Z87891 Personal history of nicotine dependence: Secondary | ICD-10-CM

## 2022-08-22 DIAGNOSIS — I739 Peripheral vascular disease, unspecified: Secondary | ICD-10-CM

## 2022-08-22 DIAGNOSIS — F419 Anxiety disorder, unspecified: Secondary | ICD-10-CM

## 2022-08-22 DIAGNOSIS — D649 Anemia, unspecified: Secondary | ICD-10-CM

## 2022-08-22 HISTORY — PX: ABDOMINAL WOUND DEHISCENCE: SHX540

## 2022-08-22 LAB — CREATININE, SERUM
Creatinine, Ser: 1.21 mg/dL (ref 0.61–1.24)
GFR, Estimated: 58 mL/min — ABNORMAL LOW (ref >60)

## 2022-08-22 SURGERY — REPAIR, DEHISCENCE, WOUND, ABDOMEN
Anesthesia: General | Site: Abdomen

## 2022-08-22 MED ORDER — ONDANSETRON HCL 4 MG/2ML IJ SOLN: 4.0000 mg | Freq: Once | INTRAMUSCULAR | Status: DC | PRN

## 2022-08-22 MED ORDER — ONDANSETRON HCL 4 MG/2ML IJ SOLN
INTRAMUSCULAR | Status: DC | PRN
  Administered 2022-08-22: 4 mg via INTRAVENOUS

## 2022-08-22 MED ORDER — CHLORHEXIDINE GLUCONATE 0.12 % MT SOLN: 15.0000 mL | Freq: Once | OROMUCOSAL | Status: AC

## 2022-08-22 MED ORDER — STERILE WATER FOR IRRIGATION IR SOLN
Status: DC | PRN
  Administered 2022-08-22: 1000 mL

## 2022-08-22 MED ORDER — PROPOFOL 10 MG/ML IV BOLUS
INTRAVENOUS | Status: AC
  Filled 2022-08-22: qty 20

## 2022-08-22 MED ORDER — PROPOFOL 10 MG/ML IV BOLUS
INTRAVENOUS | Status: DC | PRN
  Administered 2022-08-22: 100 mg via INTRAVENOUS

## 2022-08-22 MED ORDER — AMISULPRIDE (ANTIEMETIC) 5 MG/2ML IV SOLN: 10.0000 mg | Freq: Once | INTRAVENOUS | Status: DC | PRN

## 2022-08-22 MED ORDER — FENTANYL CITRATE (PF) 250 MCG/5ML IJ SOLN
INTRAMUSCULAR | Status: AC
  Filled 2022-08-22: qty 5

## 2022-08-22 MED ORDER — 0.9 % SODIUM CHLORIDE (POUR BTL) OPTIME
TOPICAL | Status: DC | PRN
  Administered 2022-08-22: 1000 mL

## 2022-08-22 MED ORDER — EPHEDRINE 5 MG/ML INJ
INTRAVENOUS | Status: AC
  Filled 2022-08-22: qty 5

## 2022-08-22 MED ORDER — ORAL CARE MOUTH RINSE: 15.0000 mL | Freq: Once | OROMUCOSAL | Status: AC

## 2022-08-22 MED ORDER — FENTANYL CITRATE (PF) 100 MCG/2ML IJ SOLN: 25.0000 ug | INTRAMUSCULAR | Status: DC | PRN

## 2022-08-22 MED ORDER — LIDOCAINE 2% (20 MG/ML) 5 ML SYRINGE
INTRAMUSCULAR | Status: DC | PRN
  Administered 2022-08-22: 40 mg via INTRAVENOUS

## 2022-08-22 MED ORDER — PHENYLEPHRINE 80 MCG/ML (10ML) SYRINGE FOR IV PUSH (FOR BLOOD PRESSURE SUPPORT)
PREFILLED_SYRINGE | INTRAVENOUS | Status: DC | PRN
  Administered 2022-08-22: 80 ug via INTRAVENOUS

## 2022-08-22 MED ORDER — ACETAMINOPHEN 10 MG/ML IV SOLN: 1000.0000 mg | Freq: Once | INTRAVENOUS | Status: DC | PRN

## 2022-08-22 MED ORDER — CHLORHEXIDINE GLUCONATE 0.12 % MT SOLN
OROMUCOSAL | Status: AC
  Administered 2022-08-22: 15 mL via OROMUCOSAL
  Filled 2022-08-22: qty 15

## 2022-08-22 MED ORDER — EPHEDRINE SULFATE-NACL 50-0.9 MG/10ML-% IV SOSY
PREFILLED_SYRINGE | INTRAVENOUS | Status: DC | PRN
  Administered 2022-08-22: 10 mg via INTRAVENOUS
  Administered 2022-08-22: 5 mg via INTRAVENOUS
  Administered 2022-08-22: 10 mg via INTRAVENOUS

## 2022-08-22 MED ORDER — FENTANYL CITRATE (PF) 250 MCG/5ML IJ SOLN
INTRAMUSCULAR | Status: DC | PRN
  Administered 2022-08-22: 25 ug via INTRAVENOUS
  Administered 2022-08-22: 50 ug via INTRAVENOUS
  Administered 2022-08-22: 25 ug via INTRAVENOUS

## 2022-08-22 MED ORDER — LACTATED RINGERS IV SOLN: INTRAVENOUS | Status: DC

## 2022-08-22 MED ORDER — LIDOCAINE 2% (20 MG/ML) 5 ML SYRINGE
INTRAMUSCULAR | Status: AC
  Filled 2022-08-22: qty 5

## 2022-08-22 MED ORDER — ONDANSETRON HCL 4 MG/2ML IJ SOLN
INTRAMUSCULAR | Status: AC
  Filled 2022-08-22: qty 2

## 2022-08-22 SURGICAL SUPPLY — 28 items
BAG COUNTER SPONGE SURGICOUNT (BAG) ×2 IMPLANT
BAG SPNG CNTER NS LX DISP (BAG) ×2
CANISTER SUCT 3000ML PPV (MISCELLANEOUS) ×2 IMPLANT
CANISTER WOUNDNEG PRESSURE 500 (CANNISTER) IMPLANT
COVER SURGICAL LIGHT HANDLE (MISCELLANEOUS) IMPLANT
DRAPE HALF SHEET 40X57 (DRAPES) ×4 IMPLANT
DRAPE LAPAROSCOPIC ABDOMINAL (DRAPES) ×2 IMPLANT
DRESSING VERAFLO CLEANS CC MED (GAUZE/BANDAGES/DRESSINGS) IMPLANT
DRSG VERAFLO CLEANSE CC MED (GAUZE/BANDAGES/DRESSINGS) ×2
ELECT REM PT RETURN 9FT ADLT (ELECTROSURGICAL) ×2
ELECTRODE REM PT RTRN 9FT ADLT (ELECTROSURGICAL) ×2 IMPLANT
GAUZE PAD ABD 8X10 STRL (GAUZE/BANDAGES/DRESSINGS) IMPLANT
GAUZE SPONGE 4X4 12PLY STRL (GAUZE/BANDAGES/DRESSINGS) IMPLANT
GLOVE BIO SURGEON STRL SZ7.5 (GLOVE) ×2 IMPLANT
GLOVE BIOGEL PI IND STRL 8 (GLOVE) ×2 IMPLANT
GOWN STRL REUS W/ TWL LRG LVL3 (GOWN DISPOSABLE) ×6 IMPLANT
GOWN STRL REUS W/ TWL XL LVL3 (GOWN DISPOSABLE) ×4 IMPLANT
GOWN STRL REUS W/TWL LRG LVL3 (GOWN DISPOSABLE) ×6
GOWN STRL REUS W/TWL XL LVL3 (GOWN DISPOSABLE) ×4
GRAFT SKIN WND MICRO 38 (Tissue) IMPLANT
KIT BASIN OR (CUSTOM PROCEDURE TRAY) ×2 IMPLANT
KIT TURNOVER KIT B (KITS) ×2 IMPLANT
NS IRRIG 1000ML POUR BTL (IV SOLUTION) ×4 IMPLANT
PACK GENERAL/GYN (CUSTOM PROCEDURE TRAY) ×2 IMPLANT
PAD ARMBOARD 7.5X6 YLW CONV (MISCELLANEOUS) ×4 IMPLANT
PAD NEG PRESSURE SENSATRAC (MISCELLANEOUS) IMPLANT
TOWEL GREEN STERILE (TOWEL DISPOSABLE) ×2 IMPLANT
WATER STERILE IRR 1000ML POUR (IV SOLUTION) ×2 IMPLANT

## 2022-08-22 NOTE — Progress Notes (Signed)
PCCM Progress Note   Underwent bronchoscopy with Dr. Vassie Loll 6/9, cytology remains pending. Will peripherally review chart until results finalized.  No charge   Edward Beitler D. Harris, NP-C  Pulmonary & Critical Care Personal contact information can be found on Amion  If no contact or response made please call [phone] 08/22/2022, 9:34 AM

## 2022-08-22 NOTE — Progress Notes (Signed)
Pt came back to rm 4 from PACU. Reinitiated tele. VSS. Call bell within reach.   Farrie Sann S Lidiya Reise, RN  

## 2022-08-22 NOTE — Anesthesia Preprocedure Evaluation (Addendum)
Anesthesia Evaluation  Patient identified by MRN, date of birth, ID band Patient awake    Reviewed: Allergy & Precautions, NPO status , Patient's Chart, lab work & pertinent test results  Airway Mallampati: II  TM Distance: >3 FB Neck ROM: Full    Dental  (+) Edentulous Upper, Missing   Pulmonary former smoker   Pulmonary exam normal        Cardiovascular + Peripheral Vascular Disease  Normal cardiovascular exam     Neuro/Psych   Anxiety     negative neurological ROS     GI/Hepatic negative GI ROS, Neg liver ROS,,,  Endo/Other  negative endocrine ROS    Renal/GU negative Renal ROS     Musculoskeletal  (+) Arthritis ,    Abdominal   Peds  Hematology  (+) Blood dyscrasia, anemia   Anesthesia Other Findings wound infection  Reproductive/Obstetrics                             Anesthesia Physical Anesthesia Plan  ASA: 2  Anesthesia Plan: General   Post-op Pain Management:    Induction: Intravenous  PONV Risk Score and Plan: 2 and Ondansetron, Dexamethasone and Treatment may vary due to age or medical condition  Airway Management Planned: LMA  Additional Equipment:   Intra-op Plan:   Post-operative Plan: Extubation in OR  Informed Consent: I have reviewed the patients History and Physical, chart, labs and discussed the procedure including the risks, benefits and alternatives for the proposed anesthesia with the patient or authorized representative who has indicated his/her understanding and acceptance.     Dental advisory given  Plan Discussed with: CRNA  Anesthesia Plan Comments:        Anesthesia Quick Evaluation

## 2022-08-22 NOTE — Progress Notes (Signed)
Mobility Specialist: Progress Note   08/22/22 1212  Mobility  Activity Ambulated with assistance in hallway  Level of Assistance Contact guard assist, steadying assist  Assistive Device None  Distance Ambulated (ft) 470 ft  Activity Response Tolerated well  Mobility Referral Yes  $Mobility charge 1 Mobility  Mobility Specialist Start Time (ACUTE ONLY) 1200  Mobility Specialist Stop Time (ACUTE ONLY) 1211  Mobility Specialist Time Calculation (min) (ACUTE ONLY) 11 min   Pre-Mobility: 55 HR, 93% SpO2 Post-Mobility: 72 HR, 97% SpO2  Pt received in the bed and agreeable to mobility. Mod I with bed mobility and contact guard during ambulation. C/o back pain, no rating given, otherwise asymptomatic. Pt back to bed after session with call bell and phone in reach.   Lanie Schelling Mobility Specialist Please contact via SecureChat or Rehab office at [phone]

## 2022-08-22 NOTE — Anesthesia Procedure Notes (Signed)
Procedure Name: LMA Insertion Date/Time: 08/22/2022 4:29 PM  Performed by: Ayesha Rumpf, CRNAPre-anesthesia Checklist: Patient identified, Emergency Drugs available, Suction available and Patient being monitored Patient Re-evaluated:Patient Re-evaluated prior to induction Oxygen Delivery Method: Circle System Utilized Preoxygenation: Pre-oxygenation with 100% oxygen Induction Type: IV induction Ventilation: Mask ventilation without difficulty LMA: LMA inserted LMA Size: 4.0 Number of attempts: 1 Airway Equipment and Method: Bite block Placement Confirmation: positive ETCO2 Tube secured with: Tape Dental Injury: Teeth and Oropharynx as per pre-operative assessment

## 2022-08-22 NOTE — Op Note (Signed)
Date: August 22, 2022  Preoperative diagnosis: Infected right to left femoral-femoral bypass graft with wound  Postoperative diagnosis: Same  Procedure: 1.  Irrigation and debridement with VAC change of wound overlying femoral-femoral bypass graft 2.  Application of 38 cm skin substitute using Kerecis to wound overlying femoral-femoral bypass (W9604)  Surgeon: Dr. Cephus Shelling, MD  Assistant: Aggie Moats, PA  Indication: 89 year old male with history of right to left femoral-femoral bypass with right iliac stenting with known abdominal aortic aneurysm.  He was seen in the office by Dr. Randie Heinz with enlarging aneurysm and also concern for infection of his femoral-femoral bypass graft.  He underwent debridement earlier this week and now presents for VAC change and application of skin substitute using Kerecis after risk and benefits discussed.    Findings: The VAC was removed with exposure of the femoral-femoral Dacron graft.  This was copiously irrigated.  This wound measures 5 cm x 2 cm x 3 cm.  We placed 38 cm of Kerecis skin substitute and a new VAC sponge was applied with the blue sponge.  Anesthesia: LMA  Details: Patient was taken to the operating room after informed consent was obtained.  Anesthesia was induced with an LMA.  The previous vacuum dressing was removed over his femoral-femoral bypass graft.  The abdominal wall including the wound was then prepped and draped in standard sterile fashion.  We performed timeout.  I then used sterile saline and copiously irrigated the wound until the effluent was clear.  At that point in time we then applied 38 cm of Kerecis as a skin substitute in the base of the wound.  A blue sponge was cut to the size of the wound.  TRAC pad was applied after we had good seal with dermatac.  This was connected to the [facility] with good suction.  Taken to recovery in stable condition.  Complication: None  Condition: Stable  Cephus Shelling,  MD [facility] [place] Office: [phone]   Cephus Shelling

## 2022-08-22 NOTE — Transfer of Care (Signed)
Immediate Anesthesia Transfer of Care Note  Patient: Edward Campos  Procedure(s) Performed: WASHOUT ABDOMEN WOUND AND VAC CHANGE (Abdomen) APPLICATION OF SKIN SUBSTITUTE WITH KERECIS (Abdomen)  Patient Location: PACU  Anesthesia Type:General  Level of Consciousness: awake, alert , and oriented  Airway & Oxygen Therapy: Patient Spontanous Breathing  Post-op Assessment: Report given to RN and Post -op Vital signs reviewed and stable  Post vital signs: Reviewed and stable  Last Vitals:  Vitals Value Taken Time  BP 113/60 08/22/22 1706  Temp    Pulse 63 08/22/22 1709  Resp 12 08/22/22 1709  SpO2 98 % 08/22/22 1709  Vitals shown include unvalidated device data.  Last Pain:  Vitals:   08/22/22 1556  TempSrc: Oral  PainSc: 0-No pain      Patients Stated Pain Goal: 0 (08/16/22 0644)  Complications: No notable events documented.

## 2022-08-22 NOTE — Progress Notes (Addendum)
  Progress Note    08/22/2022 1:31 PM 1 Day Post-Op  Subjective: No overnight complaints  Vitals:   08/22/22 0955 08/22/22 1245  BP: 127/68 125/63  Pulse: (!) 55   Resp: 18   Temp: 97.7 F (36.5 C) 97.8 F (36.6 C)  SpO2: 97% 98%    Physical Exam: Awake alert oriented Pelvic wound VAC in place  CBC    Component Value Date/Time   WBC 6.4 08/20/2022 0152   RBC 4.22 08/20/2022 0152   HGB 12.9 (L) 08/20/2022 0152   HGB 13.9 10/27/2020 1038   HCT 37.1 (L) 08/20/2022 0152   HCT 41.0 10/27/2020 1038   PLT 186 08/20/2022 0152   PLT 208 10/27/2020 1038   MCV 87.9 08/20/2022 0152   MCV 90 10/27/2020 1038   MCH 30.6 08/20/2022 0152   MCHC 34.8 08/20/2022 0152   RDW 13.9 08/20/2022 0152   RDW 12.7 10/27/2020 1038   LYMPHSABS 2.0 10/10/2010 1100   MONOABS 0.8 10/10/2010 1100   EOSABS 0.3 10/10/2010 1100   BASOSABS 0.1 10/10/2010 1100    BMET    Component Value Date/Time   NA 133 (L) 08/17/2022 0952   NA 138 10/27/2020 1038   K 3.9 08/17/2022 0952   CL 103 08/17/2022 0952   CO2 23 08/17/2022 0952   GLUCOSE 88 08/17/2022 0952   BUN 25 (H) 08/17/2022 0952   BUN 17 10/27/2020 1038   CREATININE 1.21 08/22/2022 0043   CALCIUM 8.7 (L) 08/17/2022 0952   GFRNONAA 58 (L) 08/22/2022 0043   GFRAA >60 07/07/2019 0259    INR    Component Value Date/Time   INR 1.2 08/16/2022 0637     Intake/Output Summary (Last 24 hours) at 08/22/2022 1331 Last data filed at 08/22/2022 0440 Gross per 24 hour  Intake 780 ml  Output 1750 ml  Net -970 ml     Assessment/plan:  89 y.o. male is status post drainage of abscess overlying femorofemoral bypass.  Now with concern for lung cancer.  Back to OR today for washout and will replace wound VAC likely leave for 1 week.    Chyann Ambrocio C. Randie Heinz, MD [facility] [place] Office: [phone] Pager: [phone]  08/22/2022 1:31 PM

## 2022-08-22 NOTE — Progress Notes (Addendum)
Pharmacy Antibiotic Note  Edward Campos is a 89 y.o. male admitted on 08/16/2022 with with  infected right to left femorofemoral bypass graft . Pt is s/p surgical I&D with wound VAC placement on 08/16/22. Pharmacy was consulted on 08/16/22 for Cefepime and Vancomycin dosing.   WBC 6.4 and afebrile.  SCr is up to 1.21 and CrCl is ~ 44 ml/min. Vascular surgery plans to go to OR tomorrow for  washout and will replace wound VAC.   Plan: Continue Cefepime 2g IV q12h  Continue Vancomycin 1000mg  IV q24h (eAUC ~433) used Scr 1.21, Vd 0.72 L/kg    > Goal AUC 400-550 Ordered  vancomycin levels at steady state , peak and trough 6/13 before goes to surgery.   Monitor daily CBC, temp, SCr, and for clinical signs of improvement  F/u cultures and de-escalate antibiotics as able   Height: 5\' 10"  (177.8 cm) Weight: 77.6 kg (171 lb 1.2 oz) IBW/kg (Calculated) : 73  Temp (24hrs), Avg:98 F (36.7 C), Min:97.7 F (36.5 C), Max:98.3 F (36.8 C)  Recent Labs  Lab 08/16/22 0637 08/17/22 0952 08/18/22 0151 08/19/22 0158 08/20/22 0152 08/22/22 0043  WBC 5.8 8.4 6.7 6.0 6.4  --   CREATININE 1.18 1.13  --   --   --  1.21     Estimated Creatinine Clearance: 44.4 mL/min (by C-G formula based on SCr of 1.21 mg/dL).    Allergies  Allergen Reactions   Penicillins Other (See Comments)    UNSPECIFIED REACTION  Did it involve swelling of the face/tongue/throat, SOB, or low BP? Unknown Did it involve sudden or severe rash/hives, skin peeling, or any reaction on the inside of your mouth or nose? Unknown Did you need to seek medical attention at a hospital or doctor's office? Unknown When did it last happen? Between ages 47-13   If all above answers are "NO", may proceed with cephalosporin use.    Antimicrobials this admission: Cefepime 6/6 >>  Vancomycin 6/6 >>   Microbiology results: 6/6 OR culture of leg tissue - no anaerobes, no aerobic growth> negatuve  6/6 MRSA PCR: Negative  Thank you for  allowing pharmacy to be a part of this patient's care.  Noah Delaine, RPh Clinical Pharmacist 08/22/2022 2:28 PM  Please check AMION for all [facility] Pharmacy phone numbers After 10:00 PM, call Main Pharmacy [phone]

## 2022-08-23 ENCOUNTER — Encounter (HOSPITAL_COMMUNITY): Payer: Self-pay | Admitting: Vascular Surgery

## 2022-08-23 LAB — VANCOMYCIN, TROUGH: Vancomycin Tr: 8 ug/mL — ABNORMAL LOW (ref 15–20)

## 2022-08-23 LAB — VANCOMYCIN, PEAK: Vancomycin Pk: 19 ug/mL — ABNORMAL LOW (ref 30–40)

## 2022-08-23 MED ORDER — VANCOMYCIN HCL 1500 MG/300ML IV SOLN
1500.0000 mg | INTRAVENOUS | Status: DC
  Administered 2022-08-24: 1500 mg via INTRAVENOUS
  Filled 2022-08-23: qty 300

## 2022-08-23 NOTE — Anesthesia Postprocedure Evaluation (Signed)
Anesthesia Post Note  Patient: Garnett Hagadorn  Procedure(s) Performed: WASHOUT ABDOMEN WOUND AND VAC CHANGE (Abdomen) APPLICATION OF SKIN SUBSTITUTE WITH KERECIS (Abdomen)     Patient location during evaluation: PACU Anesthesia Type: General Level of consciousness: awake Pain management: pain level controlled Vital Signs Assessment: post-procedure vital signs reviewed and stable Respiratory status: spontaneous breathing, nonlabored ventilation and respiratory function stable Cardiovascular status: blood pressure returned to baseline and stable Postop Assessment: no apparent nausea or vomiting Anesthetic complications: no   No notable events documented.  Last Vitals:  Vitals:   08/22/22 2348 08/23/22 0329  BP: 134/63 125/65  Pulse: (!) 54 (!) 54  Resp: 16 16  Temp: 36.4 C 36.4 C  SpO2: 96% 96%    Last Pain:  Vitals:   08/23/22 0329  TempSrc: Oral  PainSc:                  Catheryn Bacon Jerol Rufener

## 2022-08-23 NOTE — TOC Initial Note (Signed)
Transition of Care (TOC) - Initial/Assessment Note  Donn Pierini RN, BSN Transitions of Care Unit 4E- RN Case Manager See Treatment Team for direct phone #   Patient Details  Name: Edward Campos MRN: [id number] Date of Birth: January 17, 1933  Transition of Care [facility]) CM/SW Contact:    Darrold Span, RN Phone Number: 08/23/2022, 2:32 PM  Clinical Narrative:                 Referral received from home wound VAC needs- [employer] order form on chart and has been signed- order faxed back to [employer] for home [facility] approval- once approved POD to be emailed to [facility] for delivery to bedside.   CM has been Notified by [employer] liaison -following patient with MD office protocol referral prearranged for [facility] needs- CM spoke with liaison for home VAC drsg changes- and note plan for First wound vac change that will be at [facility] office in 1 week. After that, patient should have 3x weekly wound vac changes with HHRN - liaison to make note and follow up.   TOC will follow for  [facility] approval   Expected Discharge Plan: Home w Home Health Services Barriers to Discharge: Continued Medical Work up   Patient Goals and CMS Choice Patient states their goals for this hospitalization and ongoing recovery are:: return home CMS Medicare.gov Compare Post Acute Care list provided to:: Patient Choice offered to / list presented to : Patient      Expected Discharge Plan and Services   Discharge Planning Services: CM Consult Post Acute Care Choice: Durable Medical Equipment, Home Health Living arrangements for the past 2 months: Single Family Home                 DME Arranged: Vac DME Agency: [employer] Date DME Agency Contacted: 08/23/22 Time DME Agency Contacted: 1140 Representative spoke with at DME Agency: French Ana HH Arranged: RN HH Agency: [facility] Date [facility] Agency Contacted: 08/23/22 Time HH Agency Contacted: 1140 Representative spoke with at [facility] Agency: Amy  Prior Living Arrangements/Services Living  arrangements for the past 2 months: Single Family Home Lives with:: Self Patient language and need for interpreter reviewed:: Yes Do you feel safe going back to the place where you live?: Yes      Need for Family Participation in Patient Care: Yes (Comment) Care giver support system in place?: Yes (comment) Current home services: DME Criminal Activity/Legal Involvement Pertinent to Current Situation/Hospitalization: No - Comment as needed  Activities of Daily Living Home Assistive Devices/Equipment: None ADL Screening (condition at time of admission) Patient's cognitive ability adequate to safely complete daily activities?: Yes Is the patient deaf or have difficulty hearing?: No Does the patient have difficulty seeing, even when wearing glasses/contacts?: No Does the patient have difficulty concentrating, remembering, or making decisions?: No Patient able to express need for assistance with ADLs?: Yes Does the patient have difficulty dressing or bathing?: No Independently performs ADLs?: Yes (appropriate for developmental age) Does the patient have difficulty walking or climbing stairs?: No Weakness of Legs: None Weakness of Arms/Hands: None  Permission Sought/Granted Permission sought to share information with : Facility Industrial/product designer granted to share information with : Yes, Verbal Permission Granted     Permission granted to share info w AGENCY: HH/DME        Emotional Assessment Appearance:: Appears stated age Attitude/Demeanor/Rapport: Engaged Affect (typically observed): Accepting, Appropriate Orientation: : Oriented to Self, Oriented to Place, Oriented to  Time, Oriented to Situation Alcohol / Substance  Use: Not Applicable Psych Involvement: No (comment)  Admission diagnosis:  Infection of vascular bypass graft, initial encounter (HCC) [Z61.7XXA] Patient Active Problem List   Diagnosis Date Noted   Mass of left lung 08/21/2022   Infection of  vascular bypass graft, initial encounter (HCC) 08/16/2022   Typical atrial flutter (HCC)    Non-healing left groin open wound, initial encounter 07/06/2019   Status post insertion of iliac artery stent 06/08/2019   Peripheral vascular disease (HCC) 06/08/2019   PCP:  Patient, No Pcp Per Pharmacy:   [id number] - [place], [place] - [address] [place] [address] Phone: [phone] Fax: [phone]  [facility] [phone] - [place],  - [address] AT [facility] [address] [place] [address] Phone: [phone] Fax: [phone]     Social Determinants of Health (SDOH) Social History: SDOH Screenings   Food Insecurity: No Food Insecurity (08/20/2022)  Housing: Low Risk  (08/20/2022)  Transportation Needs: No Transportation Needs (08/20/2022)  Utilities: Not At Risk (08/20/2022)  Tobacco Use: Medium Risk (08/23/2022)   SDOH Interventions:     Readmission Risk Interventions     No data to display

## 2022-08-23 NOTE — Progress Notes (Signed)
Mobility Specialist: Progress Note   08/23/22 1138  Mobility  Activity Ambulated with assistance in hallway  Level of Assistance Contact guard assist, steadying assist  Assistive Device None  Distance Ambulated (ft) 940 ft  Activity Response Tolerated well  Mobility Referral Yes  $Mobility charge 1 Mobility  Mobility Specialist Start Time (ACUTE ONLY) 1126  Mobility Specialist Stop Time (ACUTE ONLY) 1137  Mobility Specialist Time Calculation (min) (ACUTE ONLY) 11 min   Pre-Mobility: 55 HR During Mobility: 96 HR Post-Mobility: 77 HR, 94% SpO2  Pt received in the bed and agreeable to mobility. Mod I with bed mobility and contact guard during ambulation for balance. LOB x1 but pt able to self recover. No c/o throughout. Pt back to bed at end of session with call bell and phone in reach.   Edward Campos Mobility Specialist Please contact via SecureChat or Rehab office at [phone]

## 2022-08-23 NOTE — Progress Notes (Signed)
Pharmacy Antibiotic Note  Edward Campos is a 89 y.o. male admitted on 08/16/2022 with with  infected right to left femorofemoral bypass graft . Pt is s/p surgical I&D with wound VAC placement on 08/16/22. Pharmacy was consulted on 08/16/22 for Cefepime and Vancomycin dosing.   6/6- excisional debridement of skin overlying fem-fem graft with wound vac placement     Day # 8 Vancomycin.  WBC 6.4 on 6/10 and afebrile. SCr is up to 1.21 and CrCl is ~ 44 ml/min.  6/12- irrigation and debridement of wound overlying fem-fem graft with wound vac change and Kerecis substitute placement   6/13: Vancomycin steady state levels checked today. VT - vanc trough,  VP = vanc peak   VT = 8  6/13@ 0840,   VP= 19 @12 :31 6/13. On 1000 mg IV q24h >> calcAUC = 319.1.   Calculated AUC is lower than desired goal 400-550.  Will  adjust Vancomycin to 1500mg  IV q24h  for predicted AUC 478.5  Plan: Continue Cefepime 2g IV q12h  Adjust Vancomycin to 1500mg  IV q24h (eAUC  =478)     > Goal AUC 400-550 F/u cultures and de-escalate antibiotics as able   Height: 5\' 10"  (177.8 cm) Weight: 77.6 kg (171 lb 1.2 oz) IBW/kg (Calculated) : 73  Temp (24hrs), Avg:97.8 F (36.6 C), Min:97.5 F (36.4 C), Max:98.3 F (36.8 C)  Recent Labs  Lab 08/17/22 0952 08/18/22 0151 08/19/22 0158 08/20/22 0152 08/22/22 0043 08/23/22 0840 08/23/22 1231  WBC 8.4 6.7 6.0 6.4  --   --   --   CREATININE 1.13  --   --   --  1.21  --   --   VANCOTROUGH  --   --   --   --   --  8*  --   VANCOPEAK  --   --   --   --   --   --  19*     Estimated Creatinine Clearance: 44.4 mL/min (by C-G formula based on SCr of 1.21 mg/dL).    Allergies  Allergen Reactions   Penicillins Other (See Comments)    UNSPECIFIED REACTION  Did it involve swelling of the face/tongue/throat, SOB, or low BP? Unknown Did it involve sudden or severe rash/hives, skin peeling, or any reaction on the inside of your mouth or nose? Unknown Did you need to seek medical  attention at a hospital or doctor's office? Unknown When did it last happen? Between ages 44-13   If all above answers are "NO", may proceed with cephalosporin use.    Antimicrobials this admission: Cefepime 6/6 >>  Vancomycin 6/6 >>   Dose adjustments:   6/13 VT = 8  6/13@ 0840,   VP 19 @12 :31 6/13 On Vanc 1000 mg IV q24h  >> calcAUC = 319.1 >> changed dose to 1500mg  IV q24h for expAUC 478.5  Microbiology results: 6/6 OR culture of leg tissue - no anaerobes, no aerobic growth> negatuve  6/6 MRSA PCR: Negative  Thank you for allowing pharmacy to be a part of this patient's care.  Noah Delaine, RPh Clinical Pharmacist 08/23/2022 4:18 PM  Please check AMION for all [facility] Pharmacy phone numbers After 10:00 PM, call Main Pharmacy [phone]

## 2022-08-23 NOTE — Progress Notes (Addendum)
  Progress Note    08/23/2022 7:22 AM 1 Day Post-Op  Subjective:  no complaints    Vitals:   08/22/22 2348 08/23/22 0329  BP: 134/63 125/65  Pulse: (!) 54 (!) 54  Resp: 16 16  Temp: 97.6 F (36.4 C) 97.6 F (36.4 C)  SpO2: 96% 96%    Physical Exam: General:  resting comfortably  Cardiac:  regular Lungs:  nonlabored Incisions:  lower abdominal wound with vac with good seal CBC    Component Value Date/Time   WBC 6.4 08/20/2022 0152   RBC 4.22 08/20/2022 0152   HGB 12.9 (L) 08/20/2022 0152   HGB 13.9 10/27/2020 1038   HCT 37.1 (L) 08/20/2022 0152   HCT 41.0 10/27/2020 1038   PLT 186 08/20/2022 0152   PLT 208 10/27/2020 1038   MCV 87.9 08/20/2022 0152   MCV 90 10/27/2020 1038   MCH 30.6 08/20/2022 0152   MCHC 34.8 08/20/2022 0152   RDW 13.9 08/20/2022 0152   RDW 12.7 10/27/2020 1038   LYMPHSABS 2.0 10/10/2010 1100   MONOABS 0.8 10/10/2010 1100   EOSABS 0.3 10/10/2010 1100   BASOSABS 0.1 10/10/2010 1100    BMET    Component Value Date/Time   NA 133 (L) 08/17/2022 0952   NA 138 10/27/2020 1038   K 3.9 08/17/2022 0952   CL 103 08/17/2022 0952   CO2 23 08/17/2022 0952   GLUCOSE 88 08/17/2022 0952   BUN 25 (H) 08/17/2022 0952   BUN 17 10/27/2020 1038   CREATININE 1.21 08/22/2022 0043   CALCIUM 8.7 (L) 08/17/2022 0952   GFRNONAA 58 (L) 08/22/2022 0043   GFRAA >60 07/07/2019 0259    INR    Component Value Date/Time   INR 1.2 08/16/2022 0637     Intake/Output Summary (Last 24 hours) at 08/23/2022 9147 Last data filed at 08/23/2022 0430 Gross per 24 hour  Intake 1040 ml  Output 750 ml  Net 290 ml      Assessment/Plan:  89 y.o. male is s/p:   6/6- excisional debridement of skin overlying fem-fem graft with wound vac placement   6/12- irrigation and debridement of wound overlying fem-fem graft with wound vac change and Kerecis substitute placement   -Feeling fine after surgery yesterday. No pain this morning -Lower abdominal wound with wound  vac with good seal. This will stay on for 1 week. Will plan for follow up with our office in 1 week for first vac change, then transition to 3x weekly wound vac changes with HHRN -Currently on broad spec abx for infection of fem-fem bypass -Pending lung biopsy results -OOB as tolerated   Loel Dubonnet, PA-C [facility] [phone] 08/23/2022 7:22 AM   I have independently interviewed and examined patient and agree with PA assessment and plan above.  Pending plan from pulmonary.  He will need wound VAC for a week and can be changed as outpatient with home health nursing.  Hopefully can begin to get some granulation tissue over the dacron otherwise will ultimately require dacron graft excision.  Chinmayi Rumer C. Randie Heinz, MD [facility] [place] Office: [phone] Pager: [phone]

## 2022-08-23 NOTE — Progress Notes (Signed)
Patient c/o left big toe pain. Patient stated that he hit his foot on the wheel of the bed while he was trying to straighten out all the cords. Patient had his socks on at that time.  Patient removed his socks tonight and noticed that is left big toe was bruised, painful and swollen. There is a small broken area of skin right above the nail that may have bled a small bit while socks were on patient. No active bleeding at this time.  Will pass along to days

## 2022-08-23 NOTE — Plan of Care (Signed)
  Problem: Clinical Measurements: Goal: Will remain free from infection Outcome: Progressing Goal: Diagnostic test results will improve Outcome: Progressing Goal: Respiratory complications will improve Outcome: Progressing Goal: Cardiovascular complication will be avoided Outcome: Progressing   Problem: Activity: Goal: Risk for activity intolerance will decrease Outcome: Progressing   

## 2022-08-23 NOTE — Progress Notes (Signed)
Mobility Specialist Progress Note:   08/23/22 1600  Mobility  Activity Ambulated with assistance in hallway  Level of Assistance Contact guard assist, steadying assist  Assistive Device None  Distance Ambulated (ft) 940 ft  Activity Response Tolerated well  Mobility Referral Yes  $Mobility charge 1 Mobility  Mobility Specialist Start Time (ACUTE ONLY) 1550  Mobility Specialist Stop Time (ACUTE ONLY) 1603  Mobility Specialist Time Calculation (min) (ACUTE ONLY) 13 min    Post Mobility:  61HR,  Pt received in bed, agreeable to mobility. C/o back pain, no rating. Otherwise asymptomatic. Left Pt in bed with call bell.  D'Vante Earlene Plater Mobility Specialist Please contact via Special educational needs teacher or Rehab office at [phone]

## 2022-08-24 ENCOUNTER — Telehealth: Payer: Self-pay | Admitting: Pulmonary Disease

## 2022-08-24 DIAGNOSIS — R918 Other nonspecific abnormal finding of lung field: Secondary | ICD-10-CM | POA: Diagnosis not present

## 2022-08-24 LAB — CYTOLOGY - NON PAP

## 2022-08-24 LAB — SURGICAL PATHOLOGY

## 2022-08-24 MED ORDER — METRONIDAZOLE 500 MG PO TABS: 500.0000 mg | ORAL_TABLET | Freq: Two times a day (BID) | ORAL | 0 refills | Status: AC

## 2022-08-24 MED ORDER — CEFPODOXIME PROXETIL 200 MG PO TABS: 400.0000 mg | ORAL_TABLET | Freq: Two times a day (BID) | ORAL | 0 refills | Status: AC

## 2022-08-24 MED ORDER — DOXYCYCLINE MONOHYDRATE 100 MG PO CAPS: 100.0000 mg | ORAL_CAPSULE | Freq: Two times a day (BID) | ORAL | 0 refills | Status: AC

## 2022-08-24 MED ORDER — OXYCODONE-ACETAMINOPHEN 5-325 MG PO TABS: 1.0000 | ORAL_TABLET | Freq: Four times a day (QID) | ORAL | 0 refills | Status: DC | PRN

## 2022-08-24 NOTE — Telephone Encounter (Signed)
  New diagnosis of lung cancer in the hospital and bronchoscopy Please set up office visit with APP to discuss biopsy results. Please schedule PET scan Referred to oncology Dr. Ellin Saba in [place]

## 2022-08-24 NOTE — Discharge Instructions (Signed)
 [facility] [place]  Discharge instructions  Lower Extremity Bypass Surgery  Please refer to the following instruction for your post-procedure care. Your surgeon or physician assistant will discuss any changes with you.  Activity  You are encouraged to walk as much as you can. You can slowly return to normal activities during the month after your surgery. Avoid strenuous activity and heavy lifting until your doctor tells you it's OK. Avoid activities such as vacuuming or swinging a golf club. Do not drive until your doctor give the OK and you are no longer taking prescription pain medications. It is also normal to have difficulty with sleep habits, eating and bowel movement after surgery. These will go away with time.  Bathing/Showering  You may shower after you go home. Do not soak in a bathtub, hot tub, or swim until the incision heals completely.  Incision Care  Clean your incision with mild soap and water. Shower every day. Pat the area dry with a clean towel. You do not need a bandage unless otherwise instructed. Do not apply any ointments or creams to your incision. If you have open wounds you will be instructed how to care for them or a visiting nurse may be arranged for you. If you have staples or sutures along your incision they will be removed at your post-op appointment. You may have skin glue on your incision. Do not peel it off. It will come off on its own in about one week. If you have a great deal of moisture in your groin, use a gauze help keep this area dry.  Diet  Resume your normal diet. There are no special food restrictions following this procedure. A low fat/ low cholesterol diet is recommended for all patients with vascular disease. In order to heal from your surgery, it is CRITICAL to get adequate nutrition. Your body requires vitamins, minerals, and protein. Vegetables are the best source of vitamins and minerals. Vegetables also provide the  perfect balance of protein. Processed food has little nutritional value, so try to avoid this.  Medications  Resume taking all your medications unless your doctor or nurse practitioner tells you not to. If your incision is causing pain, you may take over-the-counter pain relievers such as acetaminophen (Tylenol). If you were prescribed a stronger pain medication, please aware these medication can cause nausea and constipation. Prevent nausea by taking the medication with a snack or meal. Avoid constipation by drinking plenty of fluids and eating foods with high amount of fiber, such as fruits, vegetables, and grains. Take Colase 100 mg (an over-the-counter stool softener) twice a day as needed for constipation. Do not take Tylenol if you are taking prescription pain medications.  Follow Up  Our office will schedule a follow up appointment 2-3 weeks following discharge.  Please call us immediately for any of the following conditions  Severe or worsening pain in your legs or feet while at rest or while walking Increase pain, redness, warmth, or drainage (pus) from your incision site(s) Fever of 101 degree or higher The swelling in your leg with the bypass suddenly worsens and becomes more painful than when you were in the hospital If you have been instructed to feel your graft pulse then you should do so every day. If you can no longer feel this pulse, call the office immediately. Not all patients are given this instruction.  Leg swelling is common after leg bypass surgery.  The swelling should improve over a few months   following surgery. To improve the swelling, you may elevate your legs above the level of your heart while you are sitting or resting. Your surgeon or physician assistant may ask you to apply an ACE wrap or wear compression (TED) stockings to help to reduce swelling.  Reduce your risk of vascular disease  Stop smoking. If you would like help call [facility] at 1-800-QUIT-NOW  (1-800-784-8669) or [facility] at 336-586-4000.  Manage your cholesterol Maintain a desired weight Control your diabetes weight Control your diabetes Keep your blood pressure down  If you have any questions, please call the office at [phone]   

## 2022-08-24 NOTE — Progress Notes (Signed)
Patient given discharge instructions. PIV removed. Wound vac supplies delivered to the room, education provided. Telemetry box removed, CCMD notified. Patient taken to vehicle in wheelchair by staff.  Kenard Gower, RN

## 2022-08-24 NOTE — Progress Notes (Addendum)
  Progress Note    08/24/2022 8:09 AM 2 Days Post-Op  Subjective:  no major complaints. He stubbed his left great toe yesterday evening on bed wheel. Having some burning in the toe   Vitals:   08/24/22 0422 08/24/22 0750  BP: 118/80 (!) 147/81  Pulse: 60 (!) 50  Resp: 14 18  Temp: 97.6 F (36.4 C) 97.8 F (36.6 C)  SpO2: 94% 99%   Physical Exam: Cardiac:  regular Lungs:  non labored Incisions:  right lower abdominal wound VAC to suction with good seal. Minimal output Extremities:  BLE well perfused with palpable DP pulses. Left great toe a little erythematous with some ecchymosis along medial aspect of great toe nail bed Abdomen:  flat, soft Neurologic: alert and oriented  CBC    Component Value Date/Time   WBC 6.4 08/20/2022 0152   RBC 4.22 08/20/2022 0152   HGB 12.9 (L) 08/20/2022 0152   HGB 13.9 10/27/2020 1038   HCT 37.1 (L) 08/20/2022 0152   HCT 41.0 10/27/2020 1038   PLT 186 08/20/2022 0152   PLT 208 10/27/2020 1038   MCV 87.9 08/20/2022 0152   MCV 90 10/27/2020 1038   MCH 30.6 08/20/2022 0152   MCHC 34.8 08/20/2022 0152   RDW 13.9 08/20/2022 0152   RDW 12.7 10/27/2020 1038   LYMPHSABS 2.0 10/10/2010 1100   MONOABS 0.8 10/10/2010 1100   EOSABS 0.3 10/10/2010 1100   BASOSABS 0.1 10/10/2010 1100    BMET    Component Value Date/Time   NA 133 (L) 08/17/2022 0952   NA 138 10/27/2020 1038   K 3.9 08/17/2022 0952   CL 103 08/17/2022 0952   CO2 23 08/17/2022 0952   GLUCOSE 88 08/17/2022 0952   BUN 25 (H) 08/17/2022 0952   BUN 17 10/27/2020 1038   CREATININE 1.21 08/22/2022 0043   CALCIUM 8.7 (L) 08/17/2022 0952   GFRNONAA 58 (L) 08/22/2022 0043   GFRAA >60 07/07/2019 0259    INR    Component Value Date/Time   INR 1.2 08/16/2022 0637     Intake/Output Summary (Last 24 hours) at 08/24/2022 0809 Last data filed at 08/24/2022 0659 Gross per 24 hour  Intake 1411.6 ml  Output 1005 ml  Net 406.6 ml     Assessment/Plan:  89 y.o. male is s/p 6/6-  excisional debridement of skin overlying fem-fem graft with wound vac placement  6/12- irrigation and debridement of wound overlying fem-fem graft with wound vac change and Kerecis substitute placement  Right lowe abdominal wound VAC to suction with good seal Waiting on home VAC approval Will continue to monitor left great toe. Anticipate fine healing Pending lung biopsy results Cultures NGTD. Remains on Cefepime and Vanc Will have first VAC change in our office on 6/19. HH RN to change VAC after that Possible D/c later today pending biopsy results and vac approval  Dory Horn [facility] [phone] 08/24/2022 8:09 AM  VASCULAR STAFF ADDENDUM: I have independently interviewed and examined the patient. I agree with the above.  DC today. Will follow up in our office next week.  Rande Brunt. Lenell Antu, MD [facility] Phone Number: [phone] 08/24/2022 6:01 PM

## 2022-08-24 NOTE — Progress Notes (Signed)
Consulted by vascular for this 89 year old smoker for incidental finding of left  lung mass on CT abdomen. This was obtained to evaluate infection at his femorofemoral bypass site. CT chest has clarified this to be a irregular infiltrative 6 x 4.7 cm left lower lobe mass occluding segmental airways and narrowing the associated pulmonary artery branches with patchy consolidation in the left lower lobe suggesting a postobstructive pneumonia.  There is an enlarged 2 cm left hilar lymph node.  Esophagus showed an air-fluid level  Vitals:   08/24/22 0750 08/24/22 1408  BP: (!) 147/81 (!) 147/64  Pulse: (!) 50 (!) 57  Resp: 18 17  Temp: 97.8 F (36.6 C) 97.8 F (36.6 C)  SpO2: 99% 98%   On exam -speaking in full sentences, no accessory muscle use, clear breath sounds bilateral, no rhonchi, S1-S2 regular, no edema.  Bronchoscopy showed endobronchial lesion compressing left lower lobe bronchus, could not be traversed. Brushings and cytology positive for malignancy  Impression and plan New diagnosis of lung cancer presenting as left lower lobe mass Postobstructive pneumonia  -He will complete antibiotics for infected femorofemoral bypass graft and this should cover pneumonia. -PET scan as outpatient -Will refer to oncology as outpatient. I discussed results of biopsy today and implications of this diagnosis of lung cancer with him and his daughter  Comer Locket. Vassie Loll MD

## 2022-08-24 NOTE — TOC Transition Note (Signed)
Transition of Care (TOC) - CM/SW Discharge Note Donn Pierini RN, BSN Transitions of Care Unit 4E- RN Case Manager See Treatment Team for direct phone #   Patient Details  Name: Edward Campos MRN: [id number] Date of Birth: [birth year]  Transition of Care [facility]) CM/SW Contact:  Darrold Span, RN Phone Number: 08/24/2022, 1:59 PM   Clinical Narrative:    CM has received word from 36M that home wound VAC has been approved and POD emailed to [facility]- CM has picked up wound VAC and delivered to room - POD signed by patient and faxed back to [facility] liaison.  Home VAC and supplies at the bedside to transition to when cleared for transition home.   CM spoke with pt and daughter at bedside regarding HH arrangements and choice- pt voiced he has used HH in past- unsure of agency however agreeable to [facility] office referral to [facility]- pt to have first VAC change in VSS office next week then [facility] will start 3x week drsg changes at home. -liaison w/ Iantha Fallen updated on plan and to follow.   Pt to transition home once medically cleared- HHRN and Home VAC arrangements in place.   HHRN set up w/ [facility]  Home VAC arranged w/ [facility]M and at bedside     Final next level of care: Home w Home Health Services Barriers to Discharge: Continued Medical Work up   Patient Goals and CMS Choice CMS Medicare.gov Compare Post Acute Care list provided to:: Patient Choice offered to / list presented to : Patient  Discharge Placement                 Home w/ [facility]        Discharge Plan and Services Additional resources added to the After Visit Summary for     Discharge Planning Services: CM Consult Post Acute Care Choice: Durable Medical Equipment, Home Health          DME Arranged: Vac DME Agency: [facility] Date DME Agency Contacted: 08/23/22 Time DME Agency Contacted: 1140 Representative spoke with at DME Agency: [place] [facility] HH Arranged: RN [facility] Agency: Iantha Fallen Home Health Date [facility] Agency Contacted: 08/23/22 Time HH  Agency Contacted: 1140 Representative spoke with at [facility] Agency: Amy  Social Determinants of Health (SDOH) Interventions SDOH Screenings   Food Insecurity: No Food Insecurity (08/20/2022)  Housing: Low Risk  (08/20/2022)  Transportation Needs: No Transportation Needs (08/20/2022)  Utilities: Not At Risk (08/20/2022)  Tobacco Use: Medium Risk (08/23/2022)     Readmission Risk Interventions     No data to display

## 2022-08-25 NOTE — Discharge Summary (Signed)
Discharge Summary  Patient ID: Edward Campos [id number] 89 y.o. [birth year]  Admit date: 08/16/2022  Discharge date and time: 08/24/2022  6:09 PM   Admitting Physician: [name], MD   Discharge Physician: Dr. Lenell Antu  Admission Diagnoses: Infection of vascular bypass graft, initial encounter (HCC) [T82.7XXA]  Discharge Diagnoses: same; lung cancer  Admission Condition: fair  Discharged Condition: fair  Indication for Admission: perioperative wound care  Hospital Course: [name] is an 89 year old male who was brought in as an outpatient and underwent washout of surgical wound after right to left femoral to femoral bypass graft.  Wound VAC was also placed at that time.  This was performed by Dr. Randie Heinz on 08/16/2022 and he was admitted to the hospital postoperatively.  Part of inpatient workup included CT abdomen and pelvis which incidentally found a large lung mass.  Pulmonology was consulted and patient underwent video bronchoscopy with biopsy on 08/21/2022 by Dr. Vassie Loll.  Preliminary results reveal cancerous cells.  Recommendations include follow-up with pulmonology for formal biopsy results and to schedule PET scan.  He will also establish care with oncology.  He returned to the operating room with Dr. Chestine Spore for washout and placement of skin substitute fish skin and wound VAC placement on 08/22/2022.  Transition of care team was consulted for home health wound VAC and home health RN for [facility] changes.  We will see him in the office for first VAC change in 1 week and then he will have home health RN VAC changes 3 days/week thereafter.  Pharmacy was consulted for empiric antibiotic coverage due to exposed bypass graft as well as postobstructive pneumonia.  He was placed on a regimen including cefpodoxime, doxycycline, and Flagyl.  Discharge instructions were reviewed with the patient and his daughter.  He will follow-up in the VVS office in 1 week for initial VAC change.  He  was discharged home in stable condition.  Consults: pulmonary/intensive care  Treatments: surgery: Washout surgical wound with placement of wound VAC by Dr. Randie Heinz on 08/16/2022 Videobronchoscopy with mass biopsy by Dr. Vassie Loll on 08/21/2022 Washout of wound with placement of skin substitute and VAC placement by Dr. Chestine Spore on 08/22/2022  Discharge Exam: See progress note 08/24/22 Vitals:   08/24/22 1408 08/24/22 1554  BP: (!) 147/64 131/75  Pulse: (!) 57 (!) 52  Resp: 17 16  Temp: 97.8 F (36.6 C) 97.9 F (36.6 C)  SpO2: 98% 100%     Disposition: Discharge disposition: 01-Home or Self Care       Patient Instructions:  Allergies as of 08/24/2022       Reactions   Penicillins Other (See Comments)   UNSPECIFIED REACTION  Did it involve swelling of the face/tongue/throat, SOB, or low BP? Unknown Did it involve sudden or severe rash/hives, skin peeling, or any reaction on the inside of your mouth or nose? Unknown Did you need to seek medical attention at a hospital or doctor's office? Unknown When did it last happen? Between ages [age]   If all above answers are "NO", may proceed with cephalosporin use.        Medication List     TAKE these medications    aspirin EC 81 MG tablet Take 1 tablet (81 mg total) by mouth daily. Swallow whole.   cefpodoxime 200 MG tablet Commonly known as: VANTIN Take 2 tablets (400 mg total) by mouth 2 (two) times daily for 14 days.   diphenhydramine-acetaminophen 25-500 MG Tabs tablet Commonly known as: TYLENOL PM Take  1 tablet by mouth at bedtime as needed (Sleep).   doxycycline 100 MG capsule Commonly known as: MONODOX Take 1 capsule (100 mg total) by mouth 2 (two) times daily for 14 days.   GAS RELIEF EXTRA STRENGTH PO Take 2 tablets by mouth daily as needed (gas).   ibuprofen 200 MG tablet Commonly known as: ADVIL Take 400 mg by mouth every 6 (six) hours as needed.   metroNIDAZOLE 500 MG tablet Commonly known as: Flagyl Take 1  tablet (500 mg total) by mouth 2 (two) times daily for 14 days.   multivitamin with minerals tablet Take 1 tablet by mouth daily.   oxyCODONE-acetaminophen 5-325 MG tablet Commonly known as: PERCOCET/ROXICET Take 1 tablet by mouth every 6 (six) hours as needed for moderate pain.   rosuvastatin 20 MG tablet Commonly known as: CRESTOR TAKE 1 TABLET(20 MG) BY MOUTH DAILY       Activity: activity as tolerated Diet: regular diet Wound Care:  Wound VAC will be left in place for 1 week at which time we will begin 3 times per week VAC changes with home health  Follow-up with VVS in 1 week.  Follow-up with pulmonology for biopsy results and to schedule PET scan in 1 to 2 weeks.  Establish care with oncology in the next few weeks.  Signed: Emilie Rutter, PA-C 08/25/2022 8:43 AM VVS Office: [phone]

## 2022-08-26 ENCOUNTER — Encounter (HOSPITAL_COMMUNITY): Payer: Self-pay | Admitting: Pulmonary Disease

## 2022-08-27 ENCOUNTER — Ambulatory Visit (INDEPENDENT_AMBULATORY_CARE_PROVIDER_SITE_OTHER): Payer: Medicare Other | Admitting: Physician Assistant

## 2022-08-27 ENCOUNTER — Telehealth: Payer: Self-pay

## 2022-08-27 VITALS — BP 138/79 | HR 62 | Temp 98.4°F | Resp 16 | Ht 68.0 in | Wt 172.0 lb

## 2022-08-27 DIAGNOSIS — I7143 Infrarenal abdominal aortic aneurysm, without rupture: Secondary | ICD-10-CM

## 2022-08-27 DIAGNOSIS — T827XXA Infection and inflammatory reaction due to other cardiac and vascular devices, implants and grafts, initial encounter: Secondary | ICD-10-CM

## 2022-08-27 DIAGNOSIS — I739 Peripheral vascular disease, unspecified: Secondary | ICD-10-CM

## 2022-08-27 NOTE — Telephone Encounter (Signed)
Returned call to pt's daughter Neysa Bonito to schedule wound vac reapplication - Neysa Bonito stated that the wound vac came out on Friday and she was given instructions by Dr. Lenell Antu and told to give our office a call for reapplication. Pt is scheduled for this afternoon.

## 2022-08-27 NOTE — Progress Notes (Signed)
POST OPERATIVE OFFICE NOTE    CC:  F/u for surgery  HPI:  This is a 89 y.o. male who is s/p  1.  Irrigation and debridement with VAC change of wound overlying femoral-femoral bypass graft 2.  Application of 38 cm skin substitute using Kerecis to wound overlying femoral-femoral bypass ([id number]) on 08/22/22 by Dr. Chestine Spore. Prior to this he underwent  1.  Sharp excisional debridement of skin and subcutaneous tissue overlying the femoral-femoral bypass graft to 5 x 2 x 3 cm and pulse evac lavage totaling 2 L 2.  Application 38 cm fish skin with negative pressure wound VAC dressing on 08/16/22 by Dr. Randie Heinz. He was indicated for this following history of right to left femorofemoral bypass presenting with evidence of infection of his femorofemoral bypass graft. He also has known history of AAA. He did well post operatively and was discharged home on 08/24/22. He was discharged on Flagyl and Cefpodoxime x 14 days.   Pt presents today with his daughter for earlier follow up due to his wound VAC falling off. He was originally suppose to present on 6/19. However, he called over the weekend because on Friday the VAC came off while he was sleeping. His daughter has been helping with wet to dry dressing changes daily.  Pt states no pain. No fever or chills. He is unsure when his [facility] RN is suppose to come for his first VAC change.   Allergies  Allergen Reactions   Penicillins Other (See Comments)    UNSPECIFIED REACTION  Did it involve swelling of the face/tongue/throat, SOB, or low BP? Unknown Did it involve sudden or severe rash/hives, skin peeling, or any reaction on the inside of your mouth or nose? Unknown Did you need to seek medical attention at a hospital or doctor's office? Unknown When did it last happen? Between ages 77-13   If all above answers are "NO", may proceed with cephalosporin use.     Current Outpatient Medications  Medication Sig Dispense Refill   aspirin EC 81 MG tablet Take 1 tablet (81 mg  total) by mouth daily. Swallow whole. 90 tablet 3   cefpodoxime (VANTIN) 200 MG tablet Take 2 tablets (400 mg total) by mouth 2 (two) times daily for 14 days. 56 tablet 0   diphenhydramine-acetaminophen (TYLENOL PM) 25-500 MG TABS tablet Take 1 tablet by mouth at bedtime as needed (Sleep).     doxycycline (MONODOX) 100 MG capsule Take 1 capsule (100 mg total) by mouth 2 (two) times daily for 14 days. 28 capsule 0   ibuprofen (ADVIL) 200 MG tablet Take 400 mg by mouth every 6 (six) hours as needed.     metroNIDAZOLE (FLAGYL) 500 MG tablet Take 1 tablet (500 mg total) by mouth 2 (two) times daily for 14 days. 28 tablet 0   Multiple Vitamins-Minerals (MULTIVITAMIN WITH MINERALS) tablet Take 1 tablet by mouth daily.     oxyCODONE-acetaminophen (PERCOCET/ROXICET) 5-325 MG tablet Take 1 tablet by mouth every 6 (six) hours as needed for moderate pain. 10 tablet 0   rosuvastatin (CRESTOR) 20 MG tablet TAKE 1 TABLET(20 MG) BY MOUTH DAILY 90 tablet 12   Simethicone (GAS RELIEF EXTRA STRENGTH PO) Take 2 tablets by mouth daily as needed (gas).     No current facility-administered medications for this visit.     ROS:  See HPI  Physical Exam:  Vitals:   08/27/22 1414  BP: 138/79  Pulse: 62  Resp: 16  Temp: 98.4 F (36.9 C)  SpO2:  93%     Incision:  right lower quadrant abdominal incision is very healthy appearing, good granulation tissue in wound bed. No drainage. No surrounding erythema.  Extremities:  palpable pulse in Fem- fem bypass graft Neuro: alert and oriented Abdomen:  soft, non distended   Assessment/Plan:  This is a 89 y.o. male who is s/p:1.  Irrigation and debridement with VAC change of wound overlying femoral-femoral bypass graft 2.  Application of 38 cm skin substitute using Kerecis to wound overlying femoral-femoral bypass ([id number]) on 08/22/22 by Dr. Chestine Spore. Prior to this he underwent  1.  Sharp excisional debridement of skin and subcutaneous tissue overlying the femoral-femoral  bypass graft to 5 x 2 x 3 cm and pulse evac lavage totaling 2 L 2.  Application 38 cm fish skin with negative pressure wound VAC dressing on 08/16/22 by Dr. Randie Heinz. He was indicated for this following history of right to left femorofemoral bypass presenting with evidence of infection of his femorofemoral bypass graft. He also has known history of AAA - His next VAC change will be on Friday 6/21. He knows to call to have VAC changed in our office if he does not hear from his [facility]  to set up the visit on Friday -Continue  Flagyl and Cefpodoxime - Next office visit will be in 2-3 weeks for wound check  Nathanial Rancher, [facility] [phone]   Clinic MD:  Myra Gianotti

## 2022-08-29 ENCOUNTER — Other Ambulatory Visit: Payer: Self-pay

## 2022-08-29 DIAGNOSIS — R918 Other nonspecific abnormal finding of lung field: Secondary | ICD-10-CM

## 2022-08-29 NOTE — Telephone Encounter (Signed)
Since Dr. Vassie Loll is unavailable  Tammy can you please advise? There is no openings with an APP for a month. Is patient okay to wait that long?

## 2022-08-29 NOTE — Telephone Encounter (Signed)
Patient has been scheduled for HFU with Tammy Next week. I have sent referral for Oncology and placed orders for PET scan. Patient is aware. NFN

## 2022-08-29 NOTE — Telephone Encounter (Signed)
Can double book me next Thursday.

## 2022-08-30 ENCOUNTER — Other Ambulatory Visit: Payer: Self-pay

## 2022-08-30 ENCOUNTER — Telehealth: Payer: Self-pay

## 2022-08-30 DIAGNOSIS — C3432 Malignant neoplasm of lower lobe, left bronchus or lung: Secondary | ICD-10-CM | POA: Insufficient documentation

## 2022-08-30 DIAGNOSIS — C3490 Malignant neoplasm of unspecified part of unspecified bronchus or lung: Secondary | ICD-10-CM

## 2022-08-30 NOTE — Progress Notes (Signed)
[facility] [address] [place] [address]   Clinic Day:  08/31/2022  Referring physician: Oretha Milch, MD  Patient Care Team: Patient, No Pcp Per as PCP - General (General Practice) O'Neal, Ronnald Ramp, MD as PCP - Cardiology (Cardiology) Therese Sarah, RN as Oncology Nurse Navigator (Medical Oncology) Doreatha Massed, MD as Medical Oncologist (Medical Oncology)   ASSESSMENT & PLAN:   Assessment:  1.  Stage IIIa (T3 N1 M0) left lower lobe small cell lung cancer: - CT angio for abdominal aortic aneurysm (08/16/2022): Incidental possible left infrahilar lung mass.  Infrarenal AAA measures 5.7 cm (4.7 cm in 2020). - CT angio chest (08/19/2022): Irregular 6 cm solid central left lower lobe lung mass.  2 cm short axis left hilar lymph node.  Small layering left pleural effusion. - Bronchoscopy and biopsy by Dr. Vassie Loll (08/21/2022): LLL bronchus narrowed by endobronchial lesion coming extrinsically and could not be traversed. - Pathology: LLL mass biopsy-small cell carcinoma - He has infected right to left femorofemoral bypass graft with wound, s/p irrigation and debridement with VAC change of wound by Dr. Chestine Spore on 08/22/2022  2.  Social/family history: - Lives at home by himself and is independent of ADLs and IADLs.  Daughter lives in [place].  Quit smoking in March 2021.  Smoked 1 pack/day all his life.  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  No chemical exposure. - He thinks his brother might have had cancer.  Plan:  1.  Left lower lobe small cell lung cancer: - We have reviewed scans and pathology reports in detail. - Recommend PET CT scan and MRI of the brain to complete staging. - We briefly discussed treatment strategies for limited stage small cell lung cancer as well as extensive stage based on the PET scan results. - He is agreeable to treatment.  However because of his ongoing infection, I have recommended holding off against port  placement at this time.  I will reevaluate him after the PET scan and brain MRI.  We will know the exact stage at that time and we will come up with a treatment plan and order the port placement at that time.  We will also refer to radiation oncology at that time.   No orders of the defined types were placed in this encounter.     I,Katie Daubenspeck,acting as a Neurosurgeon for Doreatha Massed, MD.,have documented all relevant documentation on the behalf of Doreatha Massed, MD,as directed by  Doreatha Massed, MD while in the presence of Doreatha Massed, MD.   I, Doreatha Massed MD, have reviewed the above documentation for accuracy and completeness, and I agree with the above.   Doreatha Massed, MD   6/21/202411:10 AM  CHIEF COMPLAINT/PURPOSE OF CONSULT:   Diagnosis: left lower lobe small cell carcinoma   Cancer Staging  Small cell lung cancer, left lower lobe (HCC) Staging form: Lung, AJCC 8th Edition - Clinical stage from 08/30/2022: Stage IIIA (cT3, cN1, cM0) - Unsigned    Prior Therapy: none  Current Therapy: Under workup   HISTORY OF PRESENT ILLNESS:   Oncology History   No history exists.      Edward Campos is a 89 y.o. male presenting to clinic today for evaluation of LLL SCLC at the request of Dr. Vassie Loll.  He was initially brought in as an outpatient on 08/16/22 for washout of surgical wound after right to left femoral to femoral bypass graft and wound VAC placement. He  was taken for CTA A/P immediately after the procedure for surveillance of his abdominal aortic aneurysm. Incidentally, he was found to have probable mucoid impaction of LLL airways with a possible mass in left infrahilar region.  Further work up with CTA chest on 08/19/22 while admitted showed: 6 cm infiltrative central LLL lung mass; enlarged left hilar lymph node. He was evaluated by Dr. Vassie Loll and was taken for bronchoscopy on 08/21/22 as inpatient. Biopsy of the LLL mass confirmed small cell  carcinoma.  Today, he states that he is doing well overall. His appetite level is at 70%. His energy level is at 40%.  PAST MEDICAL HISTORY:   Past Medical History: Past Medical History:  Diagnosis Date   AAA (abdominal aortic aneurysm) (HCC)    Anxiety    Arthritis    BPH (benign prostatic hyperplasia)    Dizziness    Hyperlipidemia     Surgical History: Past Surgical History:  Procedure Laterality Date   ABDOMINAL AORTOGRAM W/LOWER EXTREMITY N/A 05/06/2019   Procedure: ABDOMINAL AORTOGRAM W/LOWER EXTREMITY;  Surgeon: Cephus Shelling, MD;  Location: [facility];  Service: Cardiovascular;  Laterality: N/A;   ABDOMINAL WOUND DEHISCENCE N/A 08/22/2022   Procedure: WASHOUT ABDOMEN WOUND AND VAC CHANGE;  Surgeon: Cephus Shelling, MD;  Location: [facility] OR;  Service: Vascular;  Laterality: N/A;   APPLICATION OF WOUND VAC  07/06/2019   Procedure: Application Of Wound Vac Left Groin;  Surgeon: Sherren Kerns, MD;  Location: [facility] OR;  Service: Vascular;;   APPLICATION OF WOUND VAC N/A 08/16/2022   Procedure: APPLICATION OF WOUND VAC;  Surgeon: [name], MD;  Location: [facility] OR;  Service: Vascular;  Laterality: N/A;   BRONCHIAL BIOPSY  08/21/2022   Procedure: BRONCHIAL BIOPSIES;  Surgeon: Oretha Milch, MD;  Location: [facility] ENDOSCOPY;  Service: Cardiopulmonary;;   BRONCHIAL BRUSHINGS  08/21/2022   Procedure: BRONCHIAL BRUSHINGS;  Surgeon: Oretha Milch, MD;  Location: [facility] ENDOSCOPY;  Service: Cardiopulmonary;;   BRONCHIAL WASHINGS  08/21/2022   Procedure: BRONCHIAL WASHINGS;  Surgeon: Oretha Milch, MD;  Location: [facility] ENDOSCOPY;  Service: Cardiopulmonary;;   CARDIOVERSION N/A 11/03/2020   Procedure: CARDIOVERSION;  Surgeon: Thurmon Fair, MD;  Location: [facility] ENDOSCOPY;  Service: Cardiovascular;  Laterality: N/A;   ENDARTERECTOMY FEMORAL Bilateral 06/08/2019   Procedure: ENDARTERECTOMY RIGHT COMMON FEMORAL; ENDARTERECTOMY RIGHT EXTERNAL ILIAC; ENDARTERECTOMY LEFT COMMON  FEMORAL; ENDARTERECTOMY LEFT EXTERNAL ILIAC;  Surgeon: Sherren Kerns, MD;  Location: [facility] OR;  Service: Vascular;  Laterality: Bilateral;   FEMORAL-FEMORAL BYPASS GRAFT Bilateral 06/08/2019   Procedure: BYPASS GRAFT FEMORAL-FEMORAL ARTERY;  Surgeon: Sherren Kerns, MD;  Location: [facility] OR;  Service: Vascular;  Laterality: Bilateral;   GROIN DEBRIDEMENT Left 07/06/2019   Procedure: INCISION AND DRAINAGE OF LEFT GROIN;  Surgeon: Sherren Kerns, MD;  Location: [facility] OR;  Service: Vascular;  Laterality: Left;   growth removed from chest     HEMORRHOID SURGERY     HEMOSTASIS CONTROL  08/21/2022   Procedure: HEMOSTASIS CONTROL;  Surgeon: Oretha Milch, MD;  Location: [facility] ENDOSCOPY;  Service: Cardiopulmonary;;   HERNIA REPAIR  12 yrs ago   left inguinal hernia   INCISE AND DRAIN ABCESS Left 07/06/2019   Incision and drainage left groin placement of VAC dressing   INCISION AND DRAINAGE OF WOUND Right 08/16/2022   Procedure: IRRIGATION AND DEBRIDEMENT WOUND;  Surgeon: [name], MD;  Location: [facility] OR;  Service: Vascular;  Laterality: Right;   INGUINAL HERNIA REPAIR  10/16/2010  Procedure: HERNIA REPAIR INGUINAL ADULT;  Surgeon: Dalia Heading;  Location: [facility] ORS;  Service: General;  Laterality: Right;   INSERTION OF ILIAC STENT Right 06/08/2019   Procedure: INSERTION OF RIGHT COMMON ILIAC AND EXTERNAL ILICAC STENT;  Surgeon: Sherren Kerns, MD;  Location: [facility] OR;  Service: Vascular;  Laterality: Right;   PATCH ANGIOPLASTY Right 06/08/2019   Procedure: Patch Angioplasty Right Common Femoral;  Surgeon: Sherren Kerns, MD;  Location: [facility] OR;  Service: Vascular;  Laterality: Right;   VIDEO BRONCHOSCOPY N/A 08/21/2022   Procedure: VIDEO BRONCHOSCOPY WITH FLUORO;  Surgeon: Oretha Milch, MD;  Location: [facility] ENDOSCOPY;  Service: Cardiopulmonary;  Laterality: N/A;    Social History: Social History   Socioeconomic History   Marital status: Divorced    Spouse name: Not on file   Number of  children: 3   Years of education: Not on file   Highest education level: Not on file  Occupational History   Not on file  Tobacco Use   Smoking status: Former    Packs/day: 0.25    Years: 70.00    Additional pack years: 0.00    Total pack years: 17.50    Types: Cigarettes    Quit date: 05/30/2019    Years since quitting: 3.2   Smokeless tobacco: Never  Vaping Use   Vaping Use: Never used  Substance and Sexual Activity   Alcohol use: Yes    Alcohol/week: 3.0 standard drinks of alcohol    Types: 3 Shots of liquor per week    Comment: 3 2oz. shots of bourbon/day   Drug use: No   Sexual activity: Not on file  Other Topics Concern   Not on file  Social History Narrative   Not on file   Social Determinants of Health   Financial Resource Strain: Not on file  Food Insecurity: No Food Insecurity (08/20/2022)   Hunger Vital Sign    Worried About Running Out of Food in the Last Year: Never true    Ran Out of Food in the Last Year: Never true  Transportation Needs: No Transportation Needs (08/20/2022)   PRAPARE - Administrator, Civil Service (Medical): No    Lack of Transportation (Non-Medical): No  Physical Activity: Not on file  Stress: Not on file  Social Connections: Not on file  Intimate Partner Violence: Not At Risk (08/20/2022)   Humiliation, Afraid, Rape, and Kick questionnaire    Fear of Current or Ex-Partner: No    Emotionally Abused: No    Physically Abused: No    Sexually Abused: No    Family History: Family History  Problem Relation Age of Onset   Stroke Mother    Pseudochol deficiency Neg Hx    Malignant hyperthermia Neg Hx    Hypotension Neg Hx    Anesthesia problems Neg Hx     Current Medications:  Current Outpatient Medications:    aspirin EC 81 MG tablet, Take 1 tablet (81 mg total) by mouth daily. Swallow whole., Disp: 90 tablet, Rfl: 3   cefpodoxime (VANTIN) 200 MG tablet, Take 2 tablets (400 mg total) by mouth 2 (two) times daily for  14 days., Disp: 56 tablet, Rfl: 0   diphenhydramine-acetaminophen (TYLENOL PM) 25-500 MG TABS tablet, Take 1 tablet by mouth at bedtime as needed (Sleep)., Disp: , Rfl:    doxycycline (MONODOX) 100 MG capsule, Take 1 capsule (100 mg total) by mouth 2 (two) times daily for 14 days., Disp: 28 capsule, Rfl: 0  ibuprofen (ADVIL) 200 MG tablet, Take 400 mg by mouth every 6 (six) hours as needed., Disp: , Rfl:    metroNIDAZOLE (FLAGYL) 500 MG tablet, Take 1 tablet (500 mg total) by mouth 2 (two) times daily for 14 days., Disp: 28 tablet, Rfl: 0   Multiple Vitamins-Minerals (MULTIVITAMIN WITH MINERALS) tablet, Take 1 tablet by mouth daily., Disp: , Rfl:    oxyCODONE-acetaminophen (PERCOCET/ROXICET) 5-325 MG tablet, Take 1 tablet by mouth every 6 (six) hours as needed for moderate pain., Disp: 10 tablet, Rfl: 0   rosuvastatin (CRESTOR) 20 MG tablet, TAKE 1 TABLET(20 MG) BY MOUTH DAILY, Disp: 90 tablet, Rfl: 12   Simethicone (GAS RELIEF EXTRA STRENGTH PO), Take 2 tablets by mouth daily as needed (gas)., Disp: , Rfl:    Allergies: Allergies  Allergen Reactions   Penicillins Other (See Comments)    UNSPECIFIED REACTION  Did it involve swelling of the face/tongue/throat, SOB, or low BP? Unknown Did it involve sudden or severe rash/hives, skin peeling, or any reaction on the inside of your mouth or nose? Unknown Did you need to seek medical attention at a hospital or doctor's office? Unknown When did it last happen? Between ages 21-13   If all above answers are "NO", may proceed with cephalosporin use.     REVIEW OF SYSTEMS:   Review of Systems  Constitutional:  Negative for chills, fatigue and fever.  HENT:   Negative for lump/mass, mouth sores, nosebleeds, sore throat and trouble swallowing.   Eyes:  Negative for eye problems.  Respiratory:  Positive for cough and shortness of breath.   Cardiovascular:  Negative for chest pain, leg swelling and palpitations.  Gastrointestinal:  Positive for  diarrhea. Negative for abdominal pain, constipation, nausea and vomiting.  Genitourinary:  Negative for bladder incontinence, difficulty urinating, dysuria, frequency, hematuria and nocturia.   Musculoskeletal:  Negative for arthralgias, back pain, flank pain, myalgias and neck pain.  Skin:  Negative for itching and rash.  Neurological:  Positive for numbness. Negative for dizziness and headaches.  Hematological:  Does not bruise/bleed easily.  Psychiatric/Behavioral:  Positive for depression. Negative for sleep disturbance and suicidal ideas. The patient is nervous/anxious.   All other systems reviewed and are negative.    VITALS:   Blood pressure 139/69, pulse 64, temperature 98.3 F (36.8 C), temperature source Oral, resp. rate 20, height 5\' 8"  (1.727 m), weight 175 lb 6.4 oz (79.6 kg), SpO2 95 %.  Wt Readings from Last 3 Encounters:  08/31/22 175 lb 6.4 oz (79.6 kg)  08/27/22 172 lb (78 kg)  08/21/22 171 lb 1.2 oz (77.6 kg)    Body mass index is 26.67 kg/m.  Performance status (ECOG): 1 - Symptomatic but completely ambulatory  PHYSICAL EXAM:   Physical Exam Vitals and nursing note reviewed. Exam conducted with a chaperone present.  Constitutional:      Appearance: Normal appearance.  Cardiovascular:     Rate and Rhythm: Normal rate and regular rhythm.     Pulses: Normal pulses.     Heart sounds: Normal heart sounds.  Pulmonary:     Effort: Pulmonary effort is normal.     Breath sounds: Normal breath sounds.  Abdominal:     Palpations: Abdomen is soft. There is no hepatomegaly, splenomegaly or mass.     Tenderness: There is no abdominal tenderness.  Musculoskeletal:     Right lower leg: No edema.     Left lower leg: No edema.  Lymphadenopathy:     Cervical: No cervical adenopathy.  Right cervical: No superficial, deep or posterior cervical adenopathy.    Left cervical: No superficial, deep or posterior cervical adenopathy.     Upper Body:     Right upper body: No  supraclavicular or axillary adenopathy.     Left upper body: No supraclavicular or axillary adenopathy.  Neurological:     General: No focal deficit present.     Mental Status: He is alert and oriented to person, place, and time.  Psychiatric:        Mood and Affect: Mood normal.        Behavior: Behavior normal.     LABS:      Latest Ref Rng & Units 08/20/2022    1:52 AM 08/19/2022    1:58 AM 08/18/2022    1:51 AM  CBC  WBC 4.0 - 10.5 K/uL 6.4  6.0  6.7   Hemoglobin 13.0 - 17.0 g/dL 14.7  82.9  56.2   Hematocrit 39.0 - 52.0 % 37.1  36.5  34.8   Platelets 150 - 400 K/uL 186  193  183       Latest Ref Rng & Units 08/22/2022   12:43 AM 08/17/2022    9:52 AM 08/16/2022    6:37 AM  CMP  Glucose 70 - 99 mg/dL  88  130   BUN 8 - 23 mg/dL  25  25   Creatinine 8.65 - 1.24 mg/dL 7.84  6.96  2.95   Sodium 135 - 145 mmol/L  133  133   Potassium 3.5 - 5.1 mmol/L  3.9  4.4   Chloride 98 - 111 mmol/L  103  99   CO2 22 - 32 mmol/L  23  26   Calcium 8.9 - 10.3 mg/dL  8.7  8.8   Total Protein 6.5 - 8.1 g/dL  6.1  6.2   Total Bilirubin 0.3 - 1.2 mg/dL  1.1  1.6   Alkaline Phos 38 - 126 U/L  78  79   AST 15 - 41 U/L  23  29   ALT 0 - 44 U/L  22  27      No results found for: "CEA1", "CEA" / No results found for: "CEA1", "CEA" No results found for: "PSA1" No results found for: "MWU132" No results found for: "CAN125"  No results found for: "TOTALPROTELP", "ALBUMINELP", "A1GS", "A2GS", "BETS", "BETA2SER", "GAMS", "MSPIKE", "SPEI" No results found for: "TIBC", "FERRITIN", "IRONPCTSAT" No results found for: "LDH"   STUDIES:   CT ANGIO CHEST AORTA W/CM & OR WO/CM  Result Date: 08/19/2022 CLINICAL DATA:  Inpatient. AAA. Possible left infrahilar lung mass on recent CT angiogram of the abdomen. * Tracking Code: BO * EXAM: CT ANGIOGRAPHY CHEST WITH CONTRAST TECHNIQUE: Multidetector CT imaging of the chest was performed using the standard protocol during bolus administration of intravenous  contrast. Multiplanar CT image reconstructions and MIPs were obtained to evaluate the vascular anatomy. RADIATION DOSE REDUCTION: This exam was performed according to the departmental dose-optimization program which includes automated exposure control, adjustment of the mA and/or kV according to patient size and/or use of iterative reconstruction technique. CONTRAST:  75mL OMNIPAQUE IOHEXOL 300 MG/ML  SOLN COMPARISON:  08/16/2022 CT angiogram of the abdomen and pelvis. FINDINGS: Cardiovascular: Normal heart size. No significant pericardial effusion/thickening. Three-vessel coronary atherosclerosis. Atherosclerotic thoracic aorta with dilated 4.1 cm ascending thoracic aorta. Normal caliber pulmonary arteries. No central pulmonary emboli. Mediastinum/Nodes: No significant thyroid nodules. Small air-fluid level in the mildly patulous midthoracic esophagus. No axillary adenopathy. No pathologically  enlarged mediastinal nodes. Enlarged 2.0 cm short axis diameter left hilar node (series 5/image 58). No right hilar adenopathy. Lungs/Pleura: No pneumothorax. Small layering left pleural effusion. No right pleural effusion. Moderate centrilobular emphysema with mild diffuse bronchial wall thickening. Irregular infiltrative 6.0 x 4.7 cm solid central left lower lobe lung mass (series 6/image 67), which occludes multiple segmental airways in the central left lower lobe and which encases and narrows the associated left lower lobe pulmonary artery branches. Patchy mild consolidation and ground-glass opacity in the basilar left lower lobe, favor postobstructive change. No additional significant pulmonary nodules. Upper abdomen: No acute abnormality. Musculoskeletal: No aggressive appearing focal osseous lesions. Marked thoracic spondylosis. Review of the MIP images confirms the above findings. IMPRESSION: 1. Irregular infiltrative 6.0 cm solid central left lower lobe lung mass, highly suspicious for primary bronchogenic carcinoma.  Multidisciplinary thoracic oncology consultation is advised. Outpatient PET-CT recommended for further staging. 2. Enlarged left hilar lymph node, compatible with ipsilateral nodal metastasis. 3. Small layering left pleural effusion. 4. Three-vessel coronary atherosclerosis. 5. Small air-fluid level in the mildly patulous midthoracic esophagus, suggesting esophageal dysmotility and/or gastroesophageal reflux. 6. Moderate centrilobular emphysema with mild diffuse bronchial wall thickening, suggesting COPD. 7. Dilated 4.1 cm ascending thoracic aorta. Recommend annual imaging followup by CTA or MRA. This recommendation follows 2010 ACCF/AHA/AATS/ACR/ASA/SCA/SCAI/SIR/STS/SVM Guidelines for the Diagnosis and Management of Patients with Thoracic Aortic Disease. Circulation. 2010; 121: Z610-R604. Aortic aneurysm NOS (ICD10-I71.9). 8.  Emphysema (ICD10-J43.9). These results will be called to the ordering clinician or representative by the Radiologist Assistant, and communication documented in the PACS or [employer]. Electronically Signed   By: Delbert Phenix M.D.   On: 08/19/2022 14:13   CT Angio Abd/Pel w/ and/or w/o  Result Date: 08/16/2022 CLINICAL DATA:  Abdominal aortic aneurysm, preoperative planning. EXAM: CTA ABDOMEN AND PELVIS WITHOUT AND WITH CONTRAST TECHNIQUE: Multidetector CT imaging of the abdomen and pelvis was performed using the standard protocol during bolus administration of intravenous contrast. Multiplanar reconstructed images and MIPs were obtained and reviewed to evaluate the vascular anatomy. RADIATION DOSE REDUCTION: This exam was performed according to the departmental dose-optimization program which includes automated exposure control, adjustment of the mA and/or kV according to patient size and/or use of iterative reconstruction technique. CONTRAST:  OMNIPAQUE IOHEXOL 350 MG/ML SOLN COMPARISON:  03/03/2019 FINDINGS: VASCULAR Aorta: Infrarenal abdominal aortic aneurysm measures up to  5.7 cm and previously measured 4.7 cm. Infrarenal abdominal aortic aneurysm terminates above the aortic bifurcation. Small amount of mural thrombus associated with this aneurysm. Normal caliber of the distal descending thoracic aorta. Juxtarenal abdominal aorta measures approximately 2.7 cm. Diffuse atherosclerotic calcifications in the abdominal aorta. Celiac: Calcified plaque at the origin of the celiac trunk probably causing greater than 50% stenosis but difficult to assess the degree of stenosis. Main branches of the celiac trunk are patent. Incidentally, there is a prominent accessory left hepatic artery coming off the left gastric artery. SMA: Patent without evidence of aneurysm, dissection, vasculitis or significant stenosis. Renals: Single left renal artery with at least 50% stenosis at the origin. Two right renal arteries. The proximal right renal artery is widely patent. High-grade stenosis involving the more distal right renal artery. No evidence for aneurysm or dissection involving the renal arteries. IMA: IMA is patent. However, the origin of the IMA is poorly characterized due to the atherosclerotic disease in the abdominal aorta. Concern for short segment occlusion or stenosis at the origin. Inflow: Right common iliac artery is patent with atherosclerotic calcifications. Stents in  the right external iliac artery are patent. No significant flow in the proximal right internal iliac artery. Left common iliac artery is very tortuous and there is a saccular aneurysm coming off medial aspect of the left common iliac artery that measures approximately 1.2 cm on image 80/5. This saccular aneurysm is similar to the exam from 2020. Distal left common iliac artery measures 1.5 cm and stable. Proximal left internal iliac artery measures 1.1 cm and stable. Left external iliac artery is chronically occluded just beyond its origin. Femoral to femoral bypass graft is patent. Postoperative changes along the right side  of the femoral bypass compatible with recent surgical debridement and placement a wound VAC. Subcutaneous edema along the bypass graft. Proximal Outflow: Surgical changes in the left common femoral artery. The proximal left femoral arteries are patent. High-density material around the left common femoral artery which may be related to postoperative changes. Right common femoral artery is patent and there is flow in the right profunda femoral arteries. However, there is stenosis and occlusion involving the proximal right SFA. Veins: IVC and renal veins are patent. Proximal iliac veins appear to be patent. Main portal venous system is patent. Main hepatic veins are patent. Review of the MIP images confirms the above findings. NON-VASCULAR Lower chest: Concern for mucoid impaction involving multiple left lower lobe airway branches. Cannot exclude a mass in the left infrahilar region that roughly measures 5.2 x 4.2 cm. Evidence for underlying emphysema. Probable tiny calcified granuloma in the periphery of the left lower lobe on image 16/13. No large pleural effusions. Hepatobiliary: Normal appearance of the liver and gallbladder. No suspicious hepatic lesions. No biliary dilatation. Pancreas: Unremarkable. No pancreatic ductal dilatation or surrounding inflammatory changes. Spleen: Normal in size without focal abnormality. Adrenals/Urinary Tract: Normal adrenal glands. No suspicious renal lesions. No hydronephrosis. Normal appearance of the urinary bladder. Stomach/Bowel: Stomach is within normal limits. No significant bowel dilatation. No evidence for bowel obstruction or focal bowel inflammation. Small focus of enhancement along the posterior aspect of the right colon on image 49/11 is nonspecific. Not clear if this is related to a prominent vein. Lymphatic: High-density material around the femoral bypass and the proximal femoral arteries is likely postoperative change. No significant lymph node enlargement in the  abdomen or pelvis. Reproductive: Heterogeneous enhancement of the prostate is nonspecific. Other: There is probably trace fluid in the pelvis which is nonspecific. Negative for free air. Musculoskeletal: Multilevel degenerative disc disease throughout the lumbar spine. Prominent disc space narrowing at L4-L5. Multilevel degenerative facet disease in the lumbar spine. IMPRESSION: VASCULAR 1. Infrarenal abdominal aortic aneurysm measures up to 5.7 cm and measured 4.7 cm in 2020. No evidence for aortic rupture. No evidence for an aortic dissection. 2. Diffuse atherosclerotic disease in the aorta and iliac arteries. 3. 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 4. Stented right external iliac artery is patent. 5. 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 6. Postoperative changes associated with the femoral to femoral bypass graft as described. Bypass graft is patent. 7. 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 8. 2 right renal arteries. Evidence for severe stenosis involving the more distal right renal artery. 9. Stable left renal artery ostial stenosis. 10. IMA is patent but suspect stenosis or occlusion at the origin. 11. Occlusive disease in the right SFA. NON-VASCULAR 1. Probable mucoid impaction involving left lower lobe airways. A left infrahilar lung mass cannot be excluded. Recommend further characterization  with dedicated chest CT with IV contrast. 2. Question trace fluid in the pelvis which is nonspecific. These results will be called to the ordering clinician or representative by the Radiologist Assistant, and communication documented in the PACS or [employer]. Electronically Signed   By: Richarda Overlie M.D.   On: 08/16/2022 17:01   VAS [place] LOWER EXTREMITY BYPASS GRAFT DUPL  Result Date: 08/15/2022 LOWER EXTREMITY ARTERIAL DUPLEX STUDY Patient Name:  Edward Campos  Date of Exam:   08/15/2022 Medical Rec #:  [id number]       Accession #:    [id number] Date of Birth: 07/07/1932        Patient Gender: M Patient Age:   55 years Exam Location:  [facility] Procedure:      VAS [place] LOWER EXTREMITY BYPASS GRAFT DUPLEX Referring Phys: Lemar Livings --------------------------------------------------------------------------------  Indications: Peripheral artery disease. High Risk Factors: Hyperlipidemia. Other Factors: AAA                Quit smoking May 30, 2019.  Vascular Interventions: Right common femoral external iliac endarterectomy,                         right common (9 x 39 VBX) and external iliac stent 8 x                         59 VBX, 8 x 40 Inova, right to left femoral-femoral                         bypass, left external iliac common femoral                         endarterectomy, patch angioplasty right common femoral                         artery Dacron on 06/08/19. Current ABI:            R 0.82 L 0.83 Performing Technologist: [place] Ponder RVS  Examination Guidelines: A complete evaluation includes B-mode imaging, spectral Doppler, color Doppler, and power Doppler as needed of all accessible portions of each vessel. Bilateral testing is considered an integral part of a complete examination. Limited examinations for reoccurring indications may be performed as noted.   Right Graft #1: Fem-Fem +------------------+--------+---------------+--------+--------+                   PSV cm/sStenosis       WaveformComments +------------------+--------+---------------+--------+--------+ Inflow            303     75-99% stenosisbiphasic         +------------------+--------+---------------+--------+--------+ Prox Anastomosis  180                    biphasic         +------------------+--------+---------------+--------+--------+ Proximal Graft    62                     biphasic         +------------------+--------+---------------+--------+--------+ Mid Graft         48                      biphasic         +------------------+--------+---------------+--------+--------+ Distal Graft      53  biphasic         +------------------+--------+---------------+--------+--------+ Distal Anastomosis46                     biphasic         +------------------+--------+---------------+--------+--------+ Outflow           33                     biphasic         +------------------+--------+---------------+--------+--------+   Summary: Left: 75-99% stenosis noted in the inflow, otherwise bypass apears patent. 3.74 cm x 4.13 cm structure noted at the proximal graft with mixed echoes anterior to the graft.   See table(s) above for measurements and observations. Electronically signed by Lemar Livings MD on 08/15/2022 at 4:53:40 PM.    Final    VAS US AORTA/IVC/ILIACS  Result Date: 08/15/2022 ABDOMINAL AORTA STUDY Patient Name:  Edward Campos  Date of Exam:   08/15/2022 Medical Rec #: [id number]       Accession #:    [id number] Date of Birth: September 01, 1932        Patient Gender: M Patient Age:   61 years Exam Location:  [facility] Procedure:      VAS US AORTA/IVC/ILIACS Referring Phys: Lemar Livings --------------------------------------------------------------------------------  Indications: Follow up exam for known AAA. Risk Factors: Hyperlipidemia. Vascular Interventions: Right common femoral external iliac endarterectomy,                         right common (9 x 39 VBX) and external iliac stent 8 x                         59 VBX, 8 x 40 Inova, right to left femoral-femoral                         bypass, left external iliac common femoral                         endarterectomy, patch angioplasty right common femoral                         artery Dacron on 06/08/19. Limitations: Air/bowel gas and patient not NPO.  Comparison Study: 12/07/20 prior Performing Technologist: [place] Ponder RVS  Examination Guidelines: A complete evaluation includes B-mode imaging,  spectral Doppler, color Doppler, and power Doppler as needed of all accessible portions of each vessel. Bilateral testing is considered an integral part of a complete examination. Limited examinations for reoccurring indications may be performed as noted.  Abdominal Aorta Findings: +-----------+-------+----------+----------+--------+--------+--------+ Location   AP (cm)Trans (cm)PSV (cm/s)WaveformThrombusComments +-----------+-------+----------+----------+--------+--------+--------+ Proximal   4.93   4.99      63                                 +-----------+-------+----------+----------+--------+--------+--------+ Mid        5.11   5.93      53                                 +-----------+-------+----------+----------+--------+--------+--------+ Distal     5.24   6.63      38                                 +-----------+-------+----------+----------+--------+--------+--------+  RT CIA Prox1.1    1.5       141                                +-----------+-------+----------+----------+--------+--------+--------+ RT EIA Prox                 195                                +-----------+-------+----------+----------+--------+--------+--------+ LT CIA Prox                                           NV       +-----------+-------+----------+----------+--------+--------+--------+  Summary: Abdominal Aorta: There is evidence of abnormal dilatation of the distal Abdominal aorta. The largest aortic measurement is 6.6 cm. The largest aortic diameter has increased compared to prior exam. Previous diameter measurement was 4.7 cm obtained on 12/07/20.  *See table(s) above for measurements and observations.  Electronically signed by Lemar Livings MD on 08/15/2022 at 4:53:21 PM.    Final    VAS [place] ABI WITH/WO TBI  Result Date: 08/15/2022  LOWER EXTREMITY DOPPLER STUDY Patient Name:  Edward Campos  Date of Exam:   08/15/2022 Medical Rec #: [id number]       Accession #:    [id number]  Date of Birth: [birth year]        Patient Gender: M Patient Age:   35 years Exam Location:  [facility] Procedure:      VAS [place] ABI WITH/WO TBI Referring Phys: Lemar Livings --------------------------------------------------------------------------------  Indications: Peripheral artery disease. High Risk Factors: Hyperlipidemia. Other Factors: AAA                Quit smoking May 30, 2019.  Vascular Interventions: Right common femoral external iliac endarterectomy,                         right common (9 x 39 VBX) and external iliac stent 8 x                         59 VBX, 8 x 40 Inova, right to left femoral-femoral                         bypass, left external iliac common femoral                         endarterectomy, patch angioplasty right common femoral                         artery Dacron on 06/08/19. Comparison Study: 12/07/20 prior Performing Technologist: [place] Ponder RVS  Examination Guidelines: A complete evaluation includes at minimum, Doppler waveform signals and systolic blood pressure reading at the level of bilateral brachial, anterior tibial, and posterior tibial arteries, when vessel segments are accessible. Bilateral testing is considered an integral part of a complete examination. Photoelectric Plethysmograph (PPG) waveforms and toe systolic pressure readings are included as required and additional duplex testing as needed. Limited examinations for reoccurring indications may be performed as noted.  ABI Findings: +---------+------------------+-----+----------+--------+ Right    Rt Pressure (mmHg)IndexWaveform  Comment  +---------+------------------+-----+----------+--------+  Brachial 168                                       +---------+------------------+-----+----------+--------+ PTA      137               0.82 monophasic         +---------+------------------+-----+----------+--------+ DP       122               0.73 monophasic          +---------+------------------+-----+----------+--------+ Great Toe76                0.45 Abnormal           +---------+------------------+-----+----------+--------+ +---------+------------------+-----+----------+-------+ Left     Lt Pressure (mmHg)IndexWaveform  Comment +---------+------------------+-----+----------+-------+ Brachial 157                                      +---------+------------------+-----+----------+-------+ PTA      137               0.82 monophasic        +---------+------------------+-----+----------+-------+ DP       139               0.83 monophasic        +---------+------------------+-----+----------+-------+ Augusto Garbe               0.69 Abnormal          +---------+------------------+-----+----------+-------+ +-------+-----------+-----------+------------+------------+ ABI/TBIToday's ABIToday's TBIPrevious ABIPrevious TBI +-------+-----------+-----------+------------+------------+ Right  0.82       0.45       0.79        0.40         +-------+-----------+-----------+------------+------------+ Left   0.83       0.69       0.73        0.53         +-------+-----------+-----------+------------+------------+ Bilateral TBIs appear essentially unchanged compared to prior study on 12/07/20.  Summary: Right: Resting right ankle-brachial index indicates mild right lower extremity arterial disease. The right toe-brachial index is abnormal. Left: Resting left ankle-brachial index indicates mild left lower extremity arterial disease. The left toe-brachial index is abnormal. *See table(s) above for measurements and observations.  Electronically signed by Lemar Livings MD on 08/15/2022 at 10:23:00 AM.    Final

## 2022-08-30 NOTE — Telephone Encounter (Signed)
Pt called with some confusion about his appts and phone calls he'd been receiving.  Reviewed pt's chart, returned call for clarification, two identifiers used. Reviewed appts with pt and gave pt information related to who he needed to call with regards to all his upcoming testing. Pt was appreciative of "actually speaking to someone". Emotions at home are high d/t the new dx. He doesn't have a lot of support at the moment. Pt asked to have a note placed in his chart stating that he does not have internet or faxing capabilities. Placed info in [place].  He was informed by [facility] that they were too short staffed to have anyone follow him for wound vac changes. They informed him that [facility] would be taking over, but he didn't have their phone number. I gave him the number for the local office in [place] in hopes they could give him more information. Pt extremely appreciative.

## 2022-08-30 NOTE — Progress Notes (Signed)
Orders placed for PET scan, MRI and IR port per verbal order from Dr. Ellin Saba

## 2022-08-30 NOTE — Progress Notes (Signed)
Referral received for patient due to a new diagnosis of SCLC. Called patient and arranged for new patient appointment tomorrow at 1030 with Dr. Ellin Saba. Scans and IR Port tentatively scheduled.

## 2022-08-31 ENCOUNTER — Inpatient Hospital Stay: Payer: Medicare Other | Attending: Hematology | Admitting: Hematology

## 2022-08-31 VITALS — BP 139/69 | HR 64 | Temp 98.3°F | Resp 20 | Ht 68.0 in | Wt 175.4 lb

## 2022-08-31 DIAGNOSIS — T827XXA Infection and inflammatory reaction due to other cardiac and vascular devices, implants and grafts, initial encounter: Secondary | ICD-10-CM | POA: Diagnosis not present

## 2022-08-31 DIAGNOSIS — E785 Hyperlipidemia, unspecified: Secondary | ICD-10-CM | POA: Insufficient documentation

## 2022-08-31 DIAGNOSIS — Z7982 Long term (current) use of aspirin: Secondary | ICD-10-CM | POA: Insufficient documentation

## 2022-08-31 DIAGNOSIS — Z79899 Other long term (current) drug therapy: Secondary | ICD-10-CM | POA: Diagnosis not present

## 2022-08-31 DIAGNOSIS — C3432 Malignant neoplasm of lower lobe, left bronchus or lung: Secondary | ICD-10-CM

## 2022-08-31 DIAGNOSIS — J90 Pleural effusion, not elsewhere classified: Secondary | ICD-10-CM | POA: Diagnosis not present

## 2022-08-31 DIAGNOSIS — Z792 Long term (current) use of antibiotics: Secondary | ICD-10-CM | POA: Insufficient documentation

## 2022-08-31 DIAGNOSIS — I739 Peripheral vascular disease, unspecified: Secondary | ICD-10-CM | POA: Diagnosis not present

## 2022-08-31 DIAGNOSIS — N400 Enlarged prostate without lower urinary tract symptoms: Secondary | ICD-10-CM | POA: Diagnosis not present

## 2022-08-31 DIAGNOSIS — I7143 Infrarenal abdominal aortic aneurysm, without rupture: Secondary | ICD-10-CM | POA: Diagnosis not present

## 2022-08-31 DIAGNOSIS — Z87891 Personal history of nicotine dependence: Secondary | ICD-10-CM | POA: Insufficient documentation

## 2022-08-31 DIAGNOSIS — I714 Abdominal aortic aneurysm, without rupture, unspecified: Secondary | ICD-10-CM | POA: Diagnosis not present

## 2022-08-31 DIAGNOSIS — C3490 Malignant neoplasm of unspecified part of unspecified bronchus or lung: Secondary | ICD-10-CM | POA: Diagnosis not present

## 2022-08-31 NOTE — Patient Instructions (Addendum)
[facility] at [facility] Discharge Instructions   You were seen and examined today by Dr. Ellin Saba.  He discussed with you the results of your biopsy which is showing small cell lung cancer.  Dr. Kirtland Bouchard would like to obtain a PET scan, which is a special scan that looks for cancer spread in other parts of the body.   We have also scheduled an MRI of your brain, as this cancer is prone to spread to the brain.   He discussed with you treatment for this cancer that involves chemotherapy given with radiation treatments if the cancer has not spread anywhere else in the body. If the cancer has spread outside of the lungs/chest, then the only option for treatment would be chemotherapy.   Chemotherapy treatments will require you to have a port a cath placed. This allows chemotherapy to be administered safely through a large vein. Dr. Kirtland Bouchard would like to have this procedure done after your wound and infection have healed.   Return as scheduled.    Thank you for choosing [facility] at [facility] to provide your oncology and hematology care.  To afford each patient quality time with our provider, please arrive at least 15 minutes before your scheduled appointment time.   If you have a lab appointment with the Cancer Center please come in thru the Main Entrance and check in at the main information desk.  You need to re-schedule your appointment should you arrive 10 or more minutes late.  We strive to give you quality time with our providers, and arriving late affects you and other patients whose appointments are after yours.  Also, if you no show three or more times for appointments you may be dismissed from the clinic at the providers discretion.     Again, thank you for choosing [facility].  Our hope is that these requests will decrease the amount of time that you wait before being seen by our physicians.        _____________________________________________________________  Should you have questions after your visit to [facility], please contact our office at [phone] and follow the prompts.  Our office hours are 8:00 a.m. and 4:30 p.m. Monday - Friday.  Please note that voicemails left after 4:00 p.m. may not be returned until the following business day.  We are closed weekends and major holidays.  You do have access to a nurse 24-7, just call the main number to the clinic [phone] and do not press any options, hold on the line and a nurse will answer the phone.    For prescription refill requests, have your pharmacy contact our office and allow 72 hours.    Due to Covid, you will need to wear a mask upon entering the hospital. If you do not have a mask, a mask will be given to you at the Main Entrance upon arrival. For doctor visits, patients may have 1 support person age 35 or older with them. For treatment visits, patients can not have anyone with them due to social distancing guidelines and our immunocompromised population.

## 2022-09-01 DIAGNOSIS — C3490 Malignant neoplasm of unspecified part of unspecified bronchus or lung: Secondary | ICD-10-CM | POA: Diagnosis not present

## 2022-09-01 DIAGNOSIS — I739 Peripheral vascular disease, unspecified: Secondary | ICD-10-CM | POA: Diagnosis not present

## 2022-09-01 DIAGNOSIS — Z7982 Long term (current) use of aspirin: Secondary | ICD-10-CM | POA: Diagnosis not present

## 2022-09-01 DIAGNOSIS — T827XXA Infection and inflammatory reaction due to other cardiac and vascular devices, implants and grafts, initial encounter: Secondary | ICD-10-CM | POA: Diagnosis not present

## 2022-09-01 DIAGNOSIS — I7143 Infrarenal abdominal aortic aneurysm, without rupture: Secondary | ICD-10-CM | POA: Diagnosis not present

## 2022-09-03 ENCOUNTER — Telehealth: Payer: Self-pay

## 2022-09-03 DIAGNOSIS — C3490 Malignant neoplasm of unspecified part of unspecified bronchus or lung: Secondary | ICD-10-CM | POA: Diagnosis not present

## 2022-09-03 DIAGNOSIS — I739 Peripheral vascular disease, unspecified: Secondary | ICD-10-CM | POA: Diagnosis not present

## 2022-09-03 DIAGNOSIS — Z7982 Long term (current) use of aspirin: Secondary | ICD-10-CM | POA: Diagnosis not present

## 2022-09-03 DIAGNOSIS — I7143 Infrarenal abdominal aortic aneurysm, without rupture: Secondary | ICD-10-CM | POA: Diagnosis not present

## 2022-09-03 DIAGNOSIS — T827XXA Infection and inflammatory reaction due to other cardiac and vascular devices, implants and grafts, initial encounter: Secondary | ICD-10-CM | POA: Diagnosis not present

## 2022-09-03 NOTE — Telephone Encounter (Signed)
Pt's daughter, Wilkie Aye, called stating that the pt has been without the wound vac since Saturday. The [facility] RN packed the wound with a wet-to-dry dressing and contacted [employer] for additional wound vac supplies, but they have not arrived.  Reviewed pt's chart, returned call for clarification, two identifiers used. Pt stated that the [facility] RN changed the wound vac on Friday, 6/21 as prescribed. It began beeping and not functioning later that night. The [facility] RN returned on Sat to fix it, but it began beeping again. The RN worked on it for another hour, but it wouldn't run. She took it off and placed a wet-to-dry dressing. Additional supplies are scheduled to arrive tomorrow and the [facility] RN will return to change dressing. Instructed pt to call if any further issues. Confirmed understanding.

## 2022-09-05 ENCOUNTER — Ambulatory Visit (HOSPITAL_COMMUNITY): Payer: Medicare Other

## 2022-09-05 DIAGNOSIS — C3490 Malignant neoplasm of unspecified part of unspecified bronchus or lung: Secondary | ICD-10-CM | POA: Diagnosis not present

## 2022-09-05 DIAGNOSIS — I739 Peripheral vascular disease, unspecified: Secondary | ICD-10-CM | POA: Diagnosis not present

## 2022-09-05 DIAGNOSIS — T827XXA Infection and inflammatory reaction due to other cardiac and vascular devices, implants and grafts, initial encounter: Secondary | ICD-10-CM | POA: Diagnosis not present

## 2022-09-05 DIAGNOSIS — Z7982 Long term (current) use of aspirin: Secondary | ICD-10-CM | POA: Diagnosis not present

## 2022-09-05 DIAGNOSIS — I7143 Infrarenal abdominal aortic aneurysm, without rupture: Secondary | ICD-10-CM | POA: Diagnosis not present

## 2022-09-06 ENCOUNTER — Ambulatory Visit (INDEPENDENT_AMBULATORY_CARE_PROVIDER_SITE_OTHER): Payer: Medicare Other | Admitting: Adult Health

## 2022-09-06 ENCOUNTER — Encounter: Payer: Self-pay | Admitting: Adult Health

## 2022-09-06 ENCOUNTER — Encounter (HOSPITAL_COMMUNITY)
Admission: RE | Admit: 2022-09-06 | Discharge: 2022-09-06 | Disposition: A | Discharge status: Home / Self Care | Payer: Medicare Other | Source: Ambulatory Visit | Attending: Hematology | Admitting: Hematology

## 2022-09-06 VITALS — BP 110/80 | HR 85 | Temp 97.6°F | Ht 68.0 in | Wt 174.4 lb

## 2022-09-06 DIAGNOSIS — C7951 Secondary malignant neoplasm of bone: Secondary | ICD-10-CM | POA: Diagnosis not present

## 2022-09-06 DIAGNOSIS — T827XXA Infection and inflammatory reaction due to other cardiac and vascular devices, implants and grafts, initial encounter: Secondary | ICD-10-CM

## 2022-09-06 DIAGNOSIS — R590 Localized enlarged lymph nodes: Secondary | ICD-10-CM | POA: Diagnosis not present

## 2022-09-06 DIAGNOSIS — J189 Pneumonia, unspecified organism: Secondary | ICD-10-CM

## 2022-09-06 DIAGNOSIS — G319 Degenerative disease of nervous system, unspecified: Secondary | ICD-10-CM | POA: Insufficient documentation

## 2022-09-06 DIAGNOSIS — R918 Other nonspecific abnormal finding of lung field: Secondary | ICD-10-CM | POA: Insufficient documentation

## 2022-09-06 DIAGNOSIS — K8020 Calculus of gallbladder without cholecystitis without obstruction: Secondary | ICD-10-CM | POA: Insufficient documentation

## 2022-09-06 DIAGNOSIS — I2510 Atherosclerotic heart disease of native coronary artery without angina pectoris: Secondary | ICD-10-CM | POA: Insufficient documentation

## 2022-09-06 DIAGNOSIS — J439 Emphysema, unspecified: Secondary | ICD-10-CM | POA: Diagnosis not present

## 2022-09-06 DIAGNOSIS — I6523 Occlusion and stenosis of bilateral carotid arteries: Secondary | ICD-10-CM | POA: Diagnosis not present

## 2022-09-06 DIAGNOSIS — I700 Atherosclerosis of aorta: Secondary | ICD-10-CM | POA: Insufficient documentation

## 2022-09-06 DIAGNOSIS — C3490 Malignant neoplasm of unspecified part of unspecified bronchus or lung: Secondary | ICD-10-CM | POA: Insufficient documentation

## 2022-09-06 DIAGNOSIS — C3432 Malignant neoplasm of lower lobe, left bronchus or lung: Secondary | ICD-10-CM | POA: Diagnosis not present

## 2022-09-06 DIAGNOSIS — I7143 Infrarenal abdominal aortic aneurysm, without rupture: Secondary | ICD-10-CM | POA: Diagnosis not present

## 2022-09-06 MED ORDER — FLUDEOXYGLUCOSE F - 18 (FDG) INJECTION
8.9600 | Freq: Once | INTRAVENOUS | Status: AC | PRN
  Administered 2022-09-06: 8.96 via INTRAVENOUS

## 2022-09-06 NOTE — Assessment & Plan Note (Signed)
Left lower lobe lung mass-pathology positive for small cell lung cancer.  Staging workup in process.  PET scan scheduled for later today.  MRI brain scheduled for tomorrow. Continue follow-up with oncology.  Has an appointment next week.

## 2022-09-06 NOTE — Progress Notes (Signed)
@Patient  ID: Edward Campos, male    DOB: 1933-01-12, 89 y.o.   MRN: [id number]  Chief Complaint  Patient presents with   Hospitalization Follow-up    Referring provider: No ref. provider found  HPI: 89 year old smoker seen for pulmonary consult during hospitalization in June 2024 for left lower lung mass and postobstructive pneumonia workup revealed lung cancer Admitted early June 2024 originally for infected right to left femorofemoral bypass graft with wound, s/p irrigation and debridement with VAC    TEST/EVENTS :  08/21/22 Bronchoscopy positive for endobronchial lesion compressing the left lower bronchus Brushing and cytology positive for malignancy-small cell carcinoma (tumor cells are positive for CK AE1/AE3, patchy positive for synaptophysin and chromogranin A. Ki 67 proliferation index is about 50-60% of the tumor  cells. The overall findings are in keeping with a small cell carcinoma. )  CT chest August 19, 2022 6.0 cm left lower lobe lung mass-occluding multiple segment airways in the central left lower lobe encasing and narrows the left lower lobe pulmonary artery branches.  Patchy mild consolidation and groundglass opacity left lower lobe.  Favoring postobstructive changes, enlarged left hilar lymph node, small layering left pleural effusion  PET scan pending  MRI brain pending   09/06/2022 Follow up ; Lung Cancer, Post hospital  Patient returns for a posthospital follow-up.  Patient was recently admitted for a right and left femoral bypass graft infection requiring wound irrigation and VAC therapy.  Workup showed a left lower lung mass.  Dedicated CT chest on August 19, 2022 showed a 6 cm left lower lobe lung mass.  Occluding multiple segment airways with postobstructive changes.  Patient underwent bronchoscopy August 21, 2022 showing an endobronchial lesion compressing the left lower bronchus.  Cytology/path positive for malignancy small cell carcinoma.  Patient was treated with  antibiotics with Vantin and doxycycline.  He has 2 days left of his 2-week therapy. Patient has been referred to oncology. Staging workup with  PET scan is planned for later today.  MRI brain is to for tomorrow.  Patient has a follow-up next week with oncology to review results. Since discharge patient says he is doing okay.  He remains very weak. He continues with VAC therapy for his femoral wound.  He has wound follow-up on July 3. Patient denies any hemoptysis.  Has no previous diagnosis of COPD or asthma.  Has minimal cough and congestion.  No hemoptysis.  No unintentional weight loss.   Allergies  Allergen Reactions   Penicillins Other (See Comments)    UNSPECIFIED REACTION  Did it involve swelling of the face/tongue/throat, SOB, or low BP? Unknown Did it involve sudden or severe rash/hives, skin peeling, or any reaction on the inside of your mouth or nose? Unknown Did you need to seek medical attention at a hospital or doctor's office? Unknown When did it last happen? Between ages [age]   If all above answers are "NO", may proceed with cephalosporin use.      There is no immunization history on file for this patient.  Past Medical History:  Diagnosis Date   AAA (abdominal aortic aneurysm) (HCC)    Anxiety    Arthritis    BPH (benign prostatic hyperplasia)    Dizziness    Hyperlipidemia     Tobacco History: Social History   Tobacco Use  Smoking Status Former   Packs/day: 1.50   Years: 70.00   Additional pack years: 0.00   Total pack years: 105.00   Types: Cigarettes   Quit date:  05/30/2019   Years since quitting: 3.2  Smokeless Tobacco Never   Counseling given: Not Answered   Outpatient Medications Prior to Visit  Medication Sig Dispense Refill   aspirin EC 81 MG tablet Take 1 tablet (81 mg total) by mouth daily. Swallow whole. 90 tablet 3   cefpodoxime (VANTIN) 200 MG tablet Take 2 tablets (400 mg total) by mouth 2 (two) times daily for 14 days. 56 tablet 0    doxycycline (MONODOX) 100 MG capsule Take 1 capsule (100 mg total) by mouth 2 (two) times daily for 14 days. 28 capsule 0   metroNIDAZOLE (FLAGYL) 500 MG tablet Take 1 tablet (500 mg total) by mouth 2 (two) times daily for 14 days. 28 tablet 0   Multiple Vitamins-Minerals (MULTIVITAMIN WITH MINERALS) tablet Take 1 tablet by mouth daily.     rosuvastatin (CRESTOR) 20 MG tablet TAKE 1 TABLET(20 MG) BY MOUTH DAILY 90 tablet 12   Simethicone (GAS RELIEF EXTRA STRENGTH PO) Take 2 tablets by mouth daily as needed (gas).     diphenhydramine-acetaminophen (TYLENOL PM) 25-500 MG TABS tablet Take 1 tablet by mouth at bedtime as needed (Sleep). (Patient not taking: Reported on 09/06/2022)     ibuprofen (ADVIL) 200 MG tablet Take 400 mg by mouth every 6 (six) hours as needed. (Patient not taking: Reported on 09/06/2022)     oxyCODONE-acetaminophen (PERCOCET/ROXICET) 5-325 MG tablet Take 1 tablet by mouth every 6 (six) hours as needed for moderate pain. 10 tablet 0   No facility-administered medications prior to visit.     Review of Systems:   Constitutional:   No  weight loss, night sweats,  Fevers, chills, + fatigue, or  lassitude.  HEENT:   No headaches,  Difficulty swallowing,  Tooth/dental problems, or  Sore throat,                No sneezing, itching, ear ache, nasal congestion, post nasal drip,   CV:  No chest pain,  Orthopnea, PND, swelling in lower extremities, anasarca, dizziness, palpitations, syncope.   GI  No heartburn, indigestion, abdominal pain, nausea, vomiting, diarrhea, change in bowel habits, loss of appetite, bloody stools.   Resp: No chest wall deformity  Skin: no rash or lesions.  GU: no dysuria, change in color of urine, no urgency or frequency.  No flank pain, no hematuria   MS:  No joint pain or swelling.  No decreased range of motion.  No back pain.    Physical Exam  BP 110/80 (BP Location: Left Arm, Patient Position: Sitting, Cuff Size: Normal)   Pulse 85   Temp  97.6 F (36.4 C) (Oral)   Ht 5\' 8"  (1.727 m)   Wt 174 lb 6.4 oz (79.1 kg)   SpO2 100%   BMI 26.52 kg/m   GEN: A/Ox3; pleasant , NAD, elderly    HEENT:  [place]/AT,  EACs-clear, TMs-wnl, NOSE-clear, THROAT-clear, no lesions, no postnasal drip or exudate noted.   NECK:  Supple w/ fair ROM; no JVD; normal carotid impulses w/o bruits; no thyromegaly or nodules palpated; no lymphadenopathy.    RESP  Clear  P & A; w/o, wheezes/ rales/ or rhonchi. no accessory muscle use, no dullness to percussion  CARD:  RRR, no m/r/g, no peripheral edema, pulses intact, no cyanosis or clubbing.  GI:   Soft & nt; nml bowel sounds; no organomegaly or masses detected.   Musco: Warm bil, no deformities or joint swelling noted.   Neuro: alert, no focal deficits noted.  Skin: Warm , Wound vac     Lab Results:    BNP No results found for: "BNP"  ProBNP No results found for: "PROBNP"  Imaging: CT ANGIO CHEST AORTA W/CM & OR WO/CM  Result Date: 08/19/2022 CLINICAL DATA:  Inpatient. AAA. Possible left infrahilar lung mass on recent CT angiogram of the abdomen. * Tracking Code: BO * EXAM: CT ANGIOGRAPHY CHEST WITH CONTRAST TECHNIQUE: Multidetector CT imaging of the chest was performed using the standard protocol during bolus administration of intravenous contrast. Multiplanar CT image reconstructions and MIPs were obtained to evaluate the vascular anatomy. RADIATION DOSE REDUCTION: This exam was performed according to the departmental dose-optimization program which includes automated exposure control, adjustment of the mA and/or kV according to patient size and/or use of iterative reconstruction technique. CONTRAST:  75mL OMNIPAQUE IOHEXOL 300 MG/ML  SOLN COMPARISON:  08/16/2022 CT angiogram of the abdomen and pelvis. FINDINGS: Cardiovascular: Normal heart size. No significant pericardial effusion/thickening. Three-vessel coronary atherosclerosis. Atherosclerotic thoracic aorta with dilated 4.1 cm ascending  thoracic aorta. Normal caliber pulmonary arteries. No central pulmonary emboli. Mediastinum/Nodes: No significant thyroid nodules. Small air-fluid level in the mildly patulous midthoracic esophagus. No axillary adenopathy. No pathologically enlarged mediastinal nodes. Enlarged 2.0 cm short axis diameter left hilar node (series 5/image 58). No right hilar adenopathy. Lungs/Pleura: No pneumothorax. Small layering left pleural effusion. No right pleural effusion. Moderate centrilobular emphysema with mild diffuse bronchial wall thickening. Irregular infiltrative 6.0 x 4.7 cm solid central left lower lobe lung mass (series 6/image 67), which occludes multiple segmental airways in the central left lower lobe and which encases and narrows the associated left lower lobe pulmonary artery branches. Patchy mild consolidation and ground-glass opacity in the basilar left lower lobe, favor postobstructive change. No additional significant pulmonary nodules. Upper abdomen: No acute abnormality. Musculoskeletal: No aggressive appearing focal osseous lesions. Marked thoracic spondylosis. Review of the MIP images confirms the above findings. IMPRESSION: 1. Irregular infiltrative 6.0 cm solid central left lower lobe lung mass, highly suspicious for primary bronchogenic carcinoma. Multidisciplinary thoracic oncology consultation is advised. Outpatient PET-CT recommended for further staging. 2. Enlarged left hilar lymph node, compatible with ipsilateral nodal metastasis. 3. Small layering left pleural effusion. 4. Three-vessel coronary atherosclerosis. 5. Small air-fluid level in the mildly patulous midthoracic esophagus, suggesting esophageal dysmotility and/or gastroesophageal reflux. 6. Moderate centrilobular emphysema with mild diffuse bronchial wall thickening, suggesting COPD. 7. Dilated 4.1 cm ascending thoracic aorta. Recommend annual imaging followup by CTA or MRA. This recommendation follows 2010  ACCF/AHA/AATS/ACR/ASA/SCA/SCAI/SIR/STS/SVM Guidelines for the Diagnosis and Management of Patients with Thoracic Aortic Disease. Circulation. 2010; 121: I433-I951. Aortic aneurysm NOS (ICD10-I71.9). 8.  Emphysema (ICD10-J43.9). These results will be called to the ordering clinician or representative by the Radiologist Assistant, and communication documented in the PACS or [employer]. Electronically Signed   By: Delbert Phenix M.D.   On: 08/19/2022 14:13   CT Angio Abd/Pel w/ and/or w/o  Result Date: 08/16/2022 CLINICAL DATA:  Abdominal aortic aneurysm, preoperative planning. EXAM: CTA ABDOMEN AND PELVIS WITHOUT AND WITH CONTRAST TECHNIQUE: Multidetector CT imaging of the abdomen and pelvis was performed using the standard protocol during bolus administration of intravenous contrast. Multiplanar reconstructed images and MIPs were obtained and reviewed to evaluate the vascular anatomy. RADIATION DOSE REDUCTION: This exam was performed according to the departmental dose-optimization program which includes automated exposure control, adjustment of the mA and/or kV according to patient size and/or use of iterative reconstruction technique. CONTRAST:  OMNIPAQUE IOHEXOL 350 MG/ML SOLN COMPARISON:  03/03/2019  FINDINGS: VASCULAR Aorta: Infrarenal abdominal aortic aneurysm measures up to 5.7 cm and previously measured 4.7 cm. Infrarenal abdominal aortic aneurysm terminates above the aortic bifurcation. Small amount of mural thrombus associated with this aneurysm. Normal caliber of the distal descending thoracic aorta. Juxtarenal abdominal aorta measures approximately 2.7 cm. Diffuse atherosclerotic calcifications in the abdominal aorta. Celiac: Calcified plaque at the origin of the celiac trunk probably causing greater than 50% stenosis but difficult to assess the degree of stenosis. Main branches of the celiac trunk are patent. Incidentally, there is a prominent accessory left hepatic artery coming off the left  gastric artery. SMA: Patent without evidence of aneurysm, dissection, vasculitis or significant stenosis. Renals: Single left renal artery with at least 50% stenosis at the origin. Two right renal arteries. The proximal right renal artery is widely patent. High-grade stenosis involving the more distal right renal artery. No evidence for aneurysm or dissection involving the renal arteries. IMA: IMA is patent. However, the origin of the IMA is poorly characterized due to the atherosclerotic disease in the abdominal aorta. Concern for short segment occlusion or stenosis at the origin. Inflow: Right common iliac artery is patent with atherosclerotic calcifications. Stents in the right external iliac artery are patent. No significant flow in the proximal right internal iliac artery. Left common iliac artery is very tortuous and there is a saccular aneurysm coming off medial aspect of the left common iliac artery that measures approximately 1.2 cm on image 80/5. This saccular aneurysm is similar to the exam from 2020. Distal left common iliac artery measures 1.5 cm and stable. Proximal left internal iliac artery measures 1.1 cm and stable. Left external iliac artery is chronically occluded just beyond its origin. Femoral to femoral bypass graft is patent. Postoperative changes along the right side of the femoral bypass compatible with recent surgical debridement and placement a wound VAC. Subcutaneous edema along the bypass graft. Proximal Outflow: Surgical changes in the left common femoral artery. The proximal left femoral arteries are patent. High-density material around the left common femoral artery which may be related to postoperative changes. Right common femoral artery is patent and there is flow in the right profunda femoral arteries. However, there is stenosis and occlusion involving the proximal right SFA. Veins: IVC and renal veins are patent. Proximal iliac veins appear to be patent. Main portal venous  system is patent. Main hepatic veins are patent. Review of the MIP images confirms the above findings. NON-VASCULAR Lower chest: Concern for mucoid impaction involving multiple left lower lobe airway branches. Cannot exclude a mass in the left infrahilar region that roughly measures 5.2 x 4.2 cm. Evidence for underlying emphysema. Probable tiny calcified granuloma in the periphery of the left lower lobe on image 16/13. No large pleural effusions. Hepatobiliary: Normal appearance of the liver and gallbladder. No suspicious hepatic lesions. No biliary dilatation. Pancreas: Unremarkable. No pancreatic ductal dilatation or surrounding inflammatory changes. Spleen: Normal in size without focal abnormality. Adrenals/Urinary Tract: Normal adrenal glands. No suspicious renal lesions. No hydronephrosis. Normal appearance of the urinary bladder. Stomach/Bowel: Stomach is within normal limits. No significant bowel dilatation. No evidence for bowel obstruction or focal bowel inflammation. Small focus of enhancement along the posterior aspect of the right colon on image 49/11 is nonspecific. Not clear if this is related to a prominent vein. Lymphatic: High-density material around the femoral bypass and the proximal femoral arteries is likely postoperative change. No significant lymph node enlargement in the abdomen or pelvis. Reproductive: Heterogeneous enhancement of the prostate  is nonspecific. Other: There is probably trace fluid in the pelvis which is nonspecific. Negative for free air. Musculoskeletal: Multilevel degenerative disc disease throughout the lumbar spine. Prominent disc space narrowing at L4-L5. Multilevel degenerative facet disease in the lumbar spine. IMPRESSION: VASCULAR 1. Infrarenal abdominal aortic aneurysm measures up to 5.7 cm and measured 4.7 cm in 2020. No evidence for aortic rupture. No evidence for an aortic dissection. 2. Diffuse atherosclerotic disease in the aorta and iliac arteries. 3. 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 4. Stented right external iliac artery is patent. 5. 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 6. Postoperative changes associated with the femoral to femoral bypass graft as described. Bypass graft is patent. 7. 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 8. 2 right renal arteries. Evidence for severe stenosis involving the more distal right renal artery. 9. Stable left renal artery ostial stenosis. 10. IMA is patent but suspect stenosis or occlusion at the origin. 11. Occlusive disease in the right SFA. NON-VASCULAR 1. Probable mucoid impaction involving left lower lobe airways. A left infrahilar lung mass cannot be excluded. Recommend further characterization with dedicated chest CT with IV contrast. 2. Question trace fluid in the pelvis which is nonspecific. These results will be called to the ordering clinician or representative by the Radiologist Assistant, and communication documented in the PACS or [employer]. Electronically Signed   By: Richarda Overlie M.D.   On: 08/16/2022 17:01   VAS [place] LOWER EXTREMITY BYPASS GRAFT DUPL  Result Date: 08/15/2022 LOWER EXTREMITY ARTERIAL DUPLEX STUDY Patient Name:  RICHIE BONANNO  Date of Exam:   08/15/2022 Medical Rec #: [id number]       Accession #:    [id number] Date of Birth: 1932/06/02        Patient Gender: M Patient Age:   9 years Exam Location:  [facility] Procedure:      VAS [place] LOWER EXTREMITY BYPASS GRAFT DUPLEX Referring Phys: Lemar Livings --------------------------------------------------------------------------------  Indications: Peripheral artery disease. High Risk Factors: Hyperlipidemia. Other Factors: AAA                Quit smoking May 30, 2019.  Vascular Interventions: Right common femoral external iliac endarterectomy,                         right common (9 x 39 VBX) and external iliac stent 8 x                          59 VBX, 8 x 40 Inova, right to left femoral-femoral                         bypass, left external iliac common femoral                         endarterectomy, patch angioplasty right common femoral                         artery Dacron on 06/08/19. Current ABI:            R 0.82 L 0.83 Performing Technologist: [place] Ponder RVS  Examination Guidelines: A complete evaluation includes B-mode imaging, spectral Doppler, color Doppler, and power Doppler as needed of all accessible portions of each vessel. Bilateral testing is  considered an integral part of a complete examination. Limited examinations for reoccurring indications may be performed as noted.   Right Graft #1: Fem-Fem +------------------+--------+---------------+--------+--------+                   PSV cm/sStenosis       WaveformComments +------------------+--------+---------------+--------+--------+ Inflow            303     75-99% stenosisbiphasic         +------------------+--------+---------------+--------+--------+ Prox Anastomosis  180                    biphasic         +------------------+--------+---------------+--------+--------+ Proximal Graft    62                     biphasic         +------------------+--------+---------------+--------+--------+ Mid Graft         48                     biphasic         +------------------+--------+---------------+--------+--------+ Distal Graft      53                     biphasic         +------------------+--------+---------------+--------+--------+ Distal Anastomosis46                     biphasic         +------------------+--------+---------------+--------+--------+ Outflow           33                     biphasic         +------------------+--------+---------------+--------+--------+   Summary: Left: 75-99% stenosis noted in the inflow, otherwise bypass apears patent. 3.74 cm x 4.13 cm structure noted at the proximal graft with mixed echoes  anterior to the graft.   See table(s) above for measurements and observations. Electronically signed by Lemar Livings MD on 08/15/2022 at 4:53:40 PM.    Final    VAS US AORTA/IVC/ILIACS  Result Date: 08/15/2022 ABDOMINAL AORTA STUDY Patient Name:  LENVIL SWAIM  Date of Exam:   08/15/2022 Medical Rec #: [id number]       Accession #:    [id number] Date of Birth: 31-May-1932        Patient Gender: M Patient Age:   61 years Exam Location:  [facility] Procedure:      VAS US AORTA/IVC/ILIACS Referring Phys: Lemar Livings --------------------------------------------------------------------------------  Indications: Follow up exam for known AAA. Risk Factors: Hyperlipidemia. Vascular Interventions: Right common femoral external iliac endarterectomy,                         right common (9 x 39 VBX) and external iliac stent 8 x                         59 VBX, 8 x 40 Inova, right to left femoral-femoral                         bypass, left external iliac common femoral                         endarterectomy, patch angioplasty right common femoral  artery Dacron on 06/08/19. Limitations: Air/bowel gas and patient not NPO.  Comparison Study: 12/07/20 prior Performing Technologist: [place] Ponder RVS  Examination Guidelines: A complete evaluation includes B-mode imaging, spectral Doppler, color Doppler, and power Doppler as needed of all accessible portions of each vessel. Bilateral testing is considered an integral part of a complete examination. Limited examinations for reoccurring indications may be performed as noted.  Abdominal Aorta Findings: +-----------+-------+----------+----------+--------+--------+--------+ Location   AP (cm)Trans (cm)PSV (cm/s)WaveformThrombusComments +-----------+-------+----------+----------+--------+--------+--------+ Proximal   4.93   4.99      63                                  +-----------+-------+----------+----------+--------+--------+--------+ Mid        5.11   5.93      53                                 +-----------+-------+----------+----------+--------+--------+--------+ Distal     5.24   6.63      38                                 +-----------+-------+----------+----------+--------+--------+--------+ RT CIA Prox1.1    1.5       141                                +-----------+-------+----------+----------+--------+--------+--------+ RT EIA Prox                 195                                +-----------+-------+----------+----------+--------+--------+--------+ LT CIA Prox                                           NV       +-----------+-------+----------+----------+--------+--------+--------+  Summary: Abdominal Aorta: There is evidence of abnormal dilatation of the distal Abdominal aorta. The largest aortic measurement is 6.6 cm. The largest aortic diameter has increased compared to prior exam. Previous diameter measurement was 4.7 cm obtained on 12/07/20.  *See table(s) above for measurements and observations.  Electronically signed by Lemar Livings MD on 08/15/2022 at 4:53:21 PM.    Final    VAS [place] ABI WITH/WO TBI  Result Date: 08/15/2022  LOWER EXTREMITY DOPPLER STUDY Patient Name:  LAITH ANTONELLI  Date of Exam:   08/15/2022 Medical Rec #: [id number]       Accession #:    [id number] Date of Birth: [birth year]        Patient Gender: M Patient Age:   61 years Exam Location:  [facility] Procedure:      VAS [place] ABI WITH/WO TBI Referring Phys: Lemar Livings --------------------------------------------------------------------------------  Indications: Peripheral artery disease. High Risk Factors: Hyperlipidemia. Other Factors: AAA                Quit smoking May 30, 2019.  Vascular Interventions: Right common femoral external iliac endarterectomy,                         right common (9 x 39  VBX) and external iliac stent 8 x                          59 VBX, 8 x 40 Inova, right to left femoral-femoral                         bypass, left external iliac common femoral                         endarterectomy, patch angioplasty right common femoral                         artery Dacron on 06/08/19. Comparison Study: 12/07/20 prior Performing Technologist: [place] Ponder RVS  Examination Guidelines: A complete evaluation includes at minimum, Doppler waveform signals and systolic blood pressure reading at the level of bilateral brachial, anterior tibial, and posterior tibial arteries, when vessel segments are accessible. Bilateral testing is considered an integral part of a complete examination. Photoelectric Plethysmograph (PPG) waveforms and toe systolic pressure readings are included as required and additional duplex testing as needed. Limited examinations for reoccurring indications may be performed as noted.  ABI Findings: +---------+------------------+-----+----------+--------+ Right    Rt Pressure (mmHg)IndexWaveform  Comment  +---------+------------------+-----+----------+--------+ Brachial 168                                       +---------+------------------+-----+----------+--------+ PTA      137               0.82 monophasic         +---------+------------------+-----+----------+--------+ DP       122               0.73 monophasic         +---------+------------------+-----+----------+--------+ Great Toe76                0.45 Abnormal           +---------+------------------+-----+----------+--------+ +---------+------------------+-----+----------+-------+ Left     Lt Pressure (mmHg)IndexWaveform  Comment +---------+------------------+-----+----------+-------+ Brachial 157                                      +---------+------------------+-----+----------+-------+ PTA      137               0.82 monophasic        +---------+------------------+-----+----------+-------+ DP       139                0.83 monophasic        +---------+------------------+-----+----------+-------+ Augusto Garbe               0.69 Abnormal          +---------+------------------+-----+----------+-------+ +-------+-----------+-----------+------------+------------+ ABI/TBIToday's ABIToday's TBIPrevious ABIPrevious TBI +-------+-----------+-----------+------------+------------+ Right  0.82       0.45       0.79        0.40         +-------+-----------+-----------+------------+------------+ Left   0.83       0.69       0.73        0.53         +-------+-----------+-----------+------------+------------+ Bilateral TBIs appear essentially unchanged compared to prior study on 12/07/20.  Summary: Right: Resting right  ankle-brachial index indicates mild right lower extremity arterial disease. The right toe-brachial index is abnormal. Left: Resting left ankle-brachial index indicates mild left lower extremity arterial disease. The left toe-brachial index is abnormal. *See table(s) above for measurements and observations.  Electronically signed by Lemar Livings MD on 08/15/2022 at 10:23:00 AM.    Final           No data to display          No results found for: "NITRICOXIDE"      Assessment & Plan:   Small cell lung cancer, left lower lobe (HCC) Left lower lobe lung mass-pathology positive for small cell lung cancer.  Staging workup in process.  PET scan scheduled for later today.  MRI brain scheduled for tomorrow. Continue follow-up with oncology.  Has an appointment next week.   Pneumonia Left lower lobe postobstructive pneumonia.  Clinically appears to be improving.  Patient has completed a 2-week course for pneumonia and infected femoral wound. Ongoing serial follow up with lung cancer treatments.  PET scan today .  Discussed follow up with Dr. Vassie Loll  ~6-8 weeks . He wants to hold off to see what next step with lung cancer . follow back with pulmonary as needed.   Plan  Patient  Instructions  Finish antibiotics as planned.  PET scan and MRI as planned  Follow up with Oncology as planned.  Follow up with Dr. Vassie Loll in [place] office if needed.  Please contact office for sooner follow up if symptoms do not improve or worsen or seek emergency care      Infection of vascular bypass graft, initial encounter (HCC) Finish antibiotics as planned. Cont Wound vac  Keep follow up with vascular as planned    I spent 42   minutes dedicated to the care of this patient on the date of this encounter to include pre-visit review of records, face-to-face time with the patient discussing conditions above, post visit ordering of testing, clinical documentation with the electronic health record, making appropriate referrals as documented, and communicating necessary findings to members of the patients care team.    Rubye Oaks, NP 09/06/2022

## 2022-09-06 NOTE — Patient Instructions (Addendum)
Finish antibiotics as planned.  PET scan and MRI as planned  Follow up with Oncology as planned.  Follow up with Dr. Vassie Loll in [place] office if needed.  Please contact office for sooner follow up if symptoms do not improve or worsen or seek emergency care

## 2022-09-06 NOTE — Assessment & Plan Note (Signed)
Finish antibiotics as planned. Cont Wound vac  Keep follow up with vascular as planned

## 2022-09-06 NOTE — Assessment & Plan Note (Addendum)
Left lower lobe postobstructive pneumonia.  Clinically appears to be improving.  Patient has completed a 2-week course for pneumonia and infected femoral wound. Ongoing serial follow up with lung cancer treatments.  PET scan today .  Discussed follow up with Dr. Vassie Loll  ~6-8 weeks . He wants to hold off to see what next step with lung cancer . follow back with pulmonary as needed.   Plan  Patient Instructions  Finish antibiotics as planned.  PET scan and MRI as planned  Follow up with Oncology as planned.  Follow up with Dr. Vassie Loll in [place] office if needed.  Please contact office for sooner follow up if symptoms do not improve or worsen or seek emergency care

## 2022-09-07 ENCOUNTER — Telehealth: Payer: Self-pay

## 2022-09-07 ENCOUNTER — Emergency Department (HOSPITAL_COMMUNITY)
Admission: EM | Admit: 2022-09-07 | Discharge: 2022-09-08 | Disposition: A | Discharge status: Home / Self Care | Payer: Medicare Other | Attending: Emergency Medicine | Admitting: Emergency Medicine

## 2022-09-07 ENCOUNTER — Other Ambulatory Visit: Payer: Self-pay

## 2022-09-07 ENCOUNTER — Ambulatory Visit (HOSPITAL_COMMUNITY)
Admission: RE | Admit: 2022-09-07 | Discharge: 2022-09-07 | Disposition: A | Discharge status: Home / Self Care | Payer: Medicare Other | Source: Ambulatory Visit | Attending: Hematology | Admitting: Hematology

## 2022-09-07 ENCOUNTER — Encounter (HOSPITAL_COMMUNITY): Payer: Self-pay | Admitting: Emergency Medicine

## 2022-09-07 DIAGNOSIS — Z4800 Encounter for change or removal of nonsurgical wound dressing: Secondary | ICD-10-CM | POA: Diagnosis not present

## 2022-09-07 DIAGNOSIS — Z7982 Long term (current) use of aspirin: Secondary | ICD-10-CM | POA: Diagnosis not present

## 2022-09-07 DIAGNOSIS — C3490 Malignant neoplasm of unspecified part of unspecified bronchus or lung: Secondary | ICD-10-CM | POA: Insufficient documentation

## 2022-09-07 DIAGNOSIS — Z4801 Encounter for change or removal of surgical wound dressing: Secondary | ICD-10-CM | POA: Diagnosis not present

## 2022-09-07 DIAGNOSIS — R9089 Other abnormal findings on diagnostic imaging of central nervous system: Secondary | ICD-10-CM | POA: Diagnosis not present

## 2022-09-07 DIAGNOSIS — T827XXA Infection and inflammatory reaction due to other cardiac and vascular devices, implants and grafts, initial encounter: Secondary | ICD-10-CM | POA: Diagnosis not present

## 2022-09-07 DIAGNOSIS — Z8673 Personal history of transient ischemic attack (TIA), and cerebral infarction without residual deficits: Secondary | ICD-10-CM | POA: Diagnosis not present

## 2022-09-07 DIAGNOSIS — Z87891 Personal history of nicotine dependence: Secondary | ICD-10-CM | POA: Diagnosis not present

## 2022-09-07 DIAGNOSIS — I739 Peripheral vascular disease, unspecified: Secondary | ICD-10-CM | POA: Diagnosis not present

## 2022-09-07 DIAGNOSIS — I7143 Infrarenal abdominal aortic aneurysm, without rupture: Secondary | ICD-10-CM | POA: Diagnosis not present

## 2022-09-07 DIAGNOSIS — Z5189 Encounter for other specified aftercare: Secondary | ICD-10-CM

## 2022-09-07 MED ORDER — GADOBUTROL 1 MMOL/ML IV SOLN
7.0000 mL | Freq: Once | INTRAVENOUS | Status: AC | PRN
  Administered 2022-09-07: 7 mL via INTRAVENOUS

## 2022-09-07 NOTE — ED Triage Notes (Signed)
Pt via POV requesting wound check per home health nurse. He had recent CABG surgery and says his lower abdominal incision began swelling and was painful since earlier today. He has had a wound vac in place x 11 days and when the home nurse came to perform wound care today, she removed the vac and advised him to come for eval due to concern for dehiscence/infection. Wound is dressed on arrival with no active bleeding noted, and there is no significant swelling or obvious abnormality noted in triage. Pt states the pain and swelling have improved since he arrived to the ED.

## 2022-09-07 NOTE — Telephone Encounter (Signed)
Caller: Inetta Fermo, RN at [facility]  Concern: New hard, swollen area 8 cm to left side of the abdominal incision. Issues with wound vac since last week, not providing consistent suction. Pt attempted to turn over on left side during the night and "felt a pop". Pt has no other symptoms at this time.  Location: abdomen  Treatments:  wound vac therapy  Procedure:  abd washout  Resolution: Instructed patient to proceed to nearest ER, which is [facility], or [facility] if able  Next Appt: Already sch for 09/12/22

## 2022-09-07 NOTE — ED Provider Notes (Signed)
[facility] Emergency Department Provider Note MRN:  [id number]  Arrival date & time: 09/08/22     Chief Complaint   Wound Check   History of Present Illness   Edward Campos is a 89 y.o. year-old male with a history of AAA presenting to the ED with chief complaint of wound check.  Patient had a nurse to take off his wound VAC today for wound check and the nurse did not like how it looked and he was sent here for evaluation.  Seems that the wound has opened up.  It is not any more painful than normal.  Had a recent infection of his right to left femorofemoral bypass.  Review of Systems  A thorough review of systems was obtained and all systems are negative except as noted in the HPI and PMH.   Patient's Health History    Past Medical History:  Diagnosis Date   AAA (abdominal aortic aneurysm) (HCC)    Anxiety    Arthritis    BPH (benign prostatic hyperplasia)    Dizziness    Hyperlipidemia     Past Surgical History:  Procedure Laterality Date   ABDOMINAL AORTOGRAM W/LOWER EXTREMITY N/A 05/06/2019   Procedure: ABDOMINAL AORTOGRAM W/LOWER EXTREMITY;  Surgeon: Cephus Shelling, MD;  [facility]: [facility] INV[facility]IVE CV LAB;  Service: Cardiovascular;  Laterality: N/A;   ABDOMINAL WOUND DEHISCENCE N/A 08/22/2022   Procedure: WASHOUT ABDOMEN WOUND AND VAC CHANGE;  Surgeon: Cephus Shelling, MD;  Location: [facility] OR;  Service: Vascular;  Laterality: N/A;   APPLICATION OF WOUND VAC  07/06/2019   Procedure: Application Of Wound Vac Left Groin;  Surgeon: Sherren Kerns, MD;  Location: [facility] OR;  Service: Vascular;;   APPLICATION OF WOUND VAC N/A 08/16/2022   Procedure: APPLICATION OF WOUND VAC;  Surgeon: [name], MD;  Location: [facility] OR;  Service: Vascular;  Laterality: N/A;   BRONCHIAL BIOPSY  08/21/2022   Procedure: BRONCHIAL BIOPSIES;  Surgeon: Oretha Milch, MD;  Location: [facility] ENDOSCOPY;  Service: Cardiopulmonary;;   BRONCHIAL BRUSHINGS  08/21/2022    Procedure: BRONCHIAL BRUSHINGS;  Surgeon: Oretha Milch, MD;  Location: [facility] ENDOSCOPY;  Service: Cardiopulmonary;;   BRONCHIAL WASHINGS  08/21/2022   Procedure: BRONCHIAL WASHINGS;  Surgeon: Oretha Milch, MD;  Location: [facility] ENDOSCOPY;  Service: Cardiopulmonary;;   CARDIOVERSION N/A 11/03/2020   Procedure: CARDIOVERSION;  Surgeon: Thurmon Fair, MD;  Location: [facility] ENDOSCOPY;  Service: Cardiovascular;  Laterality: N/A;   ENDARTERECTOMY FEMORAL Bilateral 06/08/2019   Procedure: ENDARTERECTOMY RIGHT COMMON FEMORAL; ENDARTERECTOMY RIGHT EXTERNAL ILIAC; ENDARTERECTOMY LEFT COMMON FEMORAL; ENDARTERECTOMY LEFT EXTERNAL ILIAC;  Surgeon: Sherren Kerns, MD;  Location: [facility] OR;  Service: Vascular;  Laterality: Bilateral;   FEMORAL-FEMORAL BYPASS GRAFT Bilateral 06/08/2019   Procedure: BYPASS GRAFT FEMORAL-FEMORAL ARTERY;  Surgeon: Sherren Kerns, MD;  Location: [facility] OR;  Service: Vascular;  Laterality: Bilateral;   GROIN DEBRIDEMENT Left 07/06/2019   Procedure: INCISION AND DRAINAGE OF LEFT GROIN;  Surgeon: Sherren Kerns, MD;  Location: [facility] OR;  Service: Vascular;  Laterality: Left;   growth removed from chest     HEMORRHOID SURGERY     HEMOSTASIS CONTROL  08/21/2022   Procedure: HEMOSTASIS CONTROL;  Surgeon: Oretha Milch, MD;  Location: [facility] ENDOSCOPY;  Service: Cardiopulmonary;;   HERNIA REPAIR  12 yrs ago   left inguinal hernia   INCISE AND DRAIN ABCESS Left 07/06/2019   Incision and drainage left groin placement of VAC dressing   INCISION AND DRAINAGE OF WOUND  Right 08/16/2022   Procedure: IRRIGATION AND DEBRIDEMENT WOUND;  Surgeon: [name], MD;  Location: [facility] OR;  Service: Vascular;  Laterality: Right;   INGUINAL HERNIA REPAIR  10/16/2010   Procedure: HERNIA REPAIR INGUINAL ADULT;  Surgeon: Dalia Heading;  Location: [facility] ORS;  Service: General;  Laterality: Right;   INSERTION OF ILIAC STENT Right 06/08/2019   Procedure: INSERTION OF RIGHT COMMON ILIAC AND EXTERNAL ILICAC STENT;   Surgeon: Sherren Kerns, MD;  Location: [facility] OR;  Service: Vascular;  Laterality: Right;   PATCH ANGIOPLASTY Right 06/08/2019   Procedure: Patch Angioplasty Right Common Femoral;  Surgeon: Sherren Kerns, MD;  Location: [facility] OR;  Service: Vascular;  Laterality: Right;   VIDEO BRONCHOSCOPY N/A 08/21/2022   Procedure: VIDEO BRONCHOSCOPY WITH FLUORO;  Surgeon: Oretha Milch, MD;  Location: [facility] ENDOSCOPY;  Service: Cardiopulmonary;  Laterality: N/A;    Family History  Problem Relation Age of Onset   Stroke Mother    Pseudochol deficiency Neg Hx    Malignant hyperthermia Neg Hx    Hypotension Neg Hx    Anesthesia problems Neg Hx     Social History   Socioeconomic History   Marital status: Divorced    Spouse name: Not on file   Number of children: 3   Years of education: Not on file   Highest education level: Not on file  Occupational History   Not on file  Tobacco Use   Smoking status: Former    Packs/day: 1.50    Years: 70.00    Additional pack years: 0.00    Total pack years: 105.00    Types: Cigarettes    Quit date: 05/30/2019    Years since quitting: 3.2   Smokeless tobacco: Never  Vaping Use   Vaping Use: Never used  Substance and Sexual Activity   Alcohol use: Yes    Alcohol/week: 3.0 standard drinks of alcohol    Types: 3 Shots of liquor per week    Comment: 3 2oz. shots of bourbon/day   Drug use: No   Sexual activity: Not on file  Other Topics Concern   Not on file  Social History Narrative   Not on file   Social Determinants of Health   Financial Resource Strain: Not on file  Food Insecurity: No Food Insecurity (08/20/2022)   Hunger Vital Sign    Worried About Running Out of Food in the Last Year: Never true    Ran Out of Food in the Last Year: Never true  Transportation Needs: No Transportation Needs (08/20/2022)   PRAPARE - Administrator, Civil Service (Medical): No    Lack of Transportation (Non-Medical): No  Physical Activity: Not on file   Stress: Not on file  Social Connections: Not on file  Intimate Partner Violence: Not At Risk (08/20/2022)   Humiliation, Afraid, Rape, and Kick questionnaire    Fear of Current or Ex-Partner: No    Emotionally Abused: No    Physically Abused: No    Sexually Abused: No     Physical Exam   Vitals:   09/07/22 2200 09/07/22 2245  BP: (!) 163/69 (!) 178/85  Pulse: 64 64  Resp:  18  Temp:    SpO2: 98% 96%    CONSTITUTIONAL: Well-appearing, NAD NEURO/PSYCH:  Alert and oriented x 3, no focal deficits EYES:  eyes equal and reactive ENT/NECK:  no LAD, no JVD CARDIO: Regular rate, well-perfused, normal S1 and S2 PULM:  CTAB no wheezing or rhonchi  GI/GU:  non-distended, non-tender MSK/SPINE:  No gross deformities, no edema SKIN: Open wound to the suprapubic region, dehiscence evident, fairly deep wound   *Additional and/or pertinent findings included in MDM below  Diagnostic and Interventional Summary    EKG Interpretation Date/Time:    Ventricular Rate:    PR Interval:    QRS Duration:    QT Interval:    QTC Calculation:   R Axis:      Text Interpretation:         Labs Reviewed - No data to display  No orders to display    Medications - No data to display   Procedures  /  Critical Care Procedures  ED Course and Medical Decision Making  Initial Impression and Ddx Concern for wound dehiscence, the tissue does not appear infected at this time, will reach out to vascular surgery for recommendations.  Past medical/surgical history that increases complexity of ED encounter: AAA, lung cancer  Interpretation of Diagnostics Laboratory and/or imaging options to aid in the diagnosis/care of the patient were considered.  After careful history and physical examination, it was determined that there was no indication for diagnostics at this time.  Patient Reassessment and Ultimate Disposition/Management     Dr. Myra Gianotti of vascular surgery was able to review the pictures  and compared to the office visit pictures recently.  Looks the same, there is no dehiscence, the wound is intentionally left open per the operative notes.  There is no evidence of infection.  Patient is in no acute distress with no significant pain, normal vitals, no fever.  Appropriate for discharge with wound care, follow-up.  Patient management required discussion with the following services or consulting groups: Vascular surgery  Complexity of Problems Addressed Acute illness or injury that poses threat of life of bodily function  Additional Data Reviewed and Analyzed Further history obtained from: Prior labs/imaging results  Additional Factors Impacting ED Encounter Risk None  Elmer Sow. Pilar Plate, MD [facility] Emergency Medicine [facility] [email or web address]  Final Clinical Impressions(s) / ED Diagnoses     ICD-10-CM   1. Visit for wound check  Z51.89       ED Discharge Orders     None        Discharge Instructions Discussed with and Provided to Patient:     Discharge Instructions      You were evaluated in the Emergency Department and after careful evaluation, we did not find any emergent condition requiring admission or further testing in the hospital.  Your exam/testing today was overall reassuring.  We discussed your wound with the vascular surgery experts.  They recommend daily wet-to-dry dressing changes and follow-up in the office.  Keep your upcoming appointment.  Please return to the Emergency Department if you experience any worsening of your condition.  Thank you for allowing [place] to be a part of your care.        Sabas Sous, MD 09/08/22 [phone]

## 2022-09-08 DIAGNOSIS — I739 Peripheral vascular disease, unspecified: Secondary | ICD-10-CM | POA: Diagnosis not present

## 2022-09-08 DIAGNOSIS — Z7982 Long term (current) use of aspirin: Secondary | ICD-10-CM | POA: Diagnosis not present

## 2022-09-08 DIAGNOSIS — C3490 Malignant neoplasm of unspecified part of unspecified bronchus or lung: Secondary | ICD-10-CM | POA: Diagnosis not present

## 2022-09-08 DIAGNOSIS — T827XXA Infection and inflammatory reaction due to other cardiac and vascular devices, implants and grafts, initial encounter: Secondary | ICD-10-CM | POA: Diagnosis not present

## 2022-09-08 DIAGNOSIS — I7143 Infrarenal abdominal aortic aneurysm, without rupture: Secondary | ICD-10-CM | POA: Diagnosis not present

## 2022-09-08 NOTE — Discharge Instructions (Signed)
You were evaluated in the Emergency Department and after careful evaluation, we did not find any emergent condition requiring admission or further testing in the hospital.  Your exam/testing today was overall reassuring.  We discussed your wound with the vascular surgery experts.  They recommend daily wet-to-dry dressing changes and follow-up in the office.  Keep your upcoming appointment.  Please return to the Emergency Department if you experience any worsening of your condition.  Thank you for allowing [place] to be a part of your care.

## 2022-09-10 ENCOUNTER — Ambulatory Visit: Payer: Medicare Other

## 2022-09-10 DIAGNOSIS — I739 Peripheral vascular disease, unspecified: Secondary | ICD-10-CM | POA: Diagnosis not present

## 2022-09-10 DIAGNOSIS — I7143 Infrarenal abdominal aortic aneurysm, without rupture: Secondary | ICD-10-CM | POA: Diagnosis not present

## 2022-09-10 DIAGNOSIS — C3490 Malignant neoplasm of unspecified part of unspecified bronchus or lung: Secondary | ICD-10-CM | POA: Diagnosis not present

## 2022-09-10 DIAGNOSIS — T827XXA Infection and inflammatory reaction due to other cardiac and vascular devices, implants and grafts, initial encounter: Secondary | ICD-10-CM | POA: Diagnosis not present

## 2022-09-10 DIAGNOSIS — Z7982 Long term (current) use of aspirin: Secondary | ICD-10-CM | POA: Diagnosis not present

## 2022-09-11 ENCOUNTER — Inpatient Hospital Stay: Payer: Medicare Other

## 2022-09-11 ENCOUNTER — Inpatient Hospital Stay: Payer: Medicare Other | Attending: Hematology | Admitting: Hematology

## 2022-09-11 VITALS — BP 134/80 | HR 62 | Temp 97.7°F | Resp 18 | Ht 68.5 in | Wt 171.6 lb

## 2022-09-11 DIAGNOSIS — N400 Enlarged prostate without lower urinary tract symptoms: Secondary | ICD-10-CM | POA: Insufficient documentation

## 2022-09-11 DIAGNOSIS — Z79899 Other long term (current) drug therapy: Secondary | ICD-10-CM | POA: Diagnosis not present

## 2022-09-11 DIAGNOSIS — Z87891 Personal history of nicotine dependence: Secondary | ICD-10-CM | POA: Insufficient documentation

## 2022-09-11 DIAGNOSIS — Z125 Encounter for screening for malignant neoplasm of prostate: Secondary | ICD-10-CM | POA: Diagnosis not present

## 2022-09-11 DIAGNOSIS — I7143 Infrarenal abdominal aortic aneurysm, without rupture: Secondary | ICD-10-CM | POA: Insufficient documentation

## 2022-09-11 DIAGNOSIS — R948 Abnormal results of function studies of other organs and systems: Secondary | ICD-10-CM

## 2022-09-11 DIAGNOSIS — E785 Hyperlipidemia, unspecified: Secondary | ICD-10-CM | POA: Diagnosis not present

## 2022-09-11 DIAGNOSIS — C7951 Secondary malignant neoplasm of bone: Secondary | ICD-10-CM | POA: Insufficient documentation

## 2022-09-11 DIAGNOSIS — J90 Pleural effusion, not elsewhere classified: Secondary | ICD-10-CM | POA: Insufficient documentation

## 2022-09-11 DIAGNOSIS — Z7982 Long term (current) use of aspirin: Secondary | ICD-10-CM | POA: Insufficient documentation

## 2022-09-11 DIAGNOSIS — M129 Arthropathy, unspecified: Secondary | ICD-10-CM | POA: Insufficient documentation

## 2022-09-11 DIAGNOSIS — C3432 Malignant neoplasm of lower lobe, left bronchus or lung: Secondary | ICD-10-CM | POA: Insufficient documentation

## 2022-09-11 LAB — PSA: Prostatic Specific Antigen: 0.42 ng/mL (ref 0.00–4.00)

## 2022-09-11 NOTE — Progress Notes (Signed)
[facility] [address], [place] [address]    Clinic Day:  09/11/2022  Referring physician: Oretha Milch, MD  Patient Care Team: Patient, No Pcp Per as PCP - General (General Practice) O'Neal, Ronnald Ramp, MD as PCP - Cardiology (Cardiology) Therese Sarah, RN as Oncology Nurse Navigator (Medical Oncology) Doreatha Massed, MD as Medical Oncologist (Medical Oncology)   ASSESSMENT & PLAN:   Assessment: 1.  Stage IIIa (T3 N1 M0) left lower lobe small cell lung cancer: - CT angio for abdominal aortic aneurysm (08/16/2022): Incidental possible left infrahilar lung mass.  Infrarenal AAA measures 5.7 cm (4.7 cm in 2020). - CT angio chest (08/19/2022): Irregular 6 cm solid central left lower lobe lung mass.  2 cm short axis left hilar lymph node.  Small layering left pleural effusion. - Bronchoscopy and biopsy by Dr. Vassie Loll (08/21/2022): LLL bronchus narrowed by endobronchial lesion coming extrinsically and could not be traversed. - Pathology: LLL mass biopsy-small cell carcinoma - He has infected right to left femorofemoral bypass graft with wound, s/p irrigation and debridement with VAC change of wound by Dr. Chestine Spore on 08/22/2022 -PET scan from 09/06/2022: Left lower lobe lung mass measuring 4.4 x 4.4 cm.  Left hilar lymph node 2 cm with SUV 6.8.  Widespread bone metastatic disease including upper sacrum and L1.  Marked left-sided prostatic hypermetabolism.   2.  Social/family history: - Lives at home by himself and is independent of ADLs and IADLs.  Daughter lives in [place].  Quit smoking in March 2021.  Smoked 1 pack/day all his life.  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  No chemical exposure. - He thinks his brother might have had cancer.    Plan: 1.  Extensive stage small cell lung cancer: - Reviewed PET scan from 09/06/2022: Left lower lobe lung mass measuring 4.4 x 4.4 cm.  Left hilar lymph node 2 cm with SUV 6.8.  Widespread bone metastatic  disease including upper sacrum and L1.  Marked left-sided prostatic hypermetabolism. - We checked PSA today which was 0.42. - Bone lesions are most likely from small cell lung cancer. - We have discussed palliative chemoimmunotherapy with carboplatin, etoposide with either durvalumab or atezolizumab for 4 cycles followed by maintenance immunotherapy.  We have also discussed best portal care in the form of hospice. - We will follow-up on the brain MRI results. - He still has wound VAC and will see his surgeon tomorrow. - Once infection is cleared, will consider port placement if he is willing to proceed with treatments. - RTC 2 weeks for follow-up.    Orders Placed This Encounter  Procedures   PSA    Standing Status:   Future    Number of Occurrences:   1    Standing Expiration Date:   09/11/2023      I,Katie Daubenspeck,acting as a scribe for Doreatha Massed, MD.,have documented all relevant documentation on the behalf of Doreatha Massed, MD,as directed by  Doreatha Massed, MD while in the presence of Doreatha Massed, MD.   I, Doreatha Massed MD, have reviewed the above documentation for accuracy and completeness, and I agree with the above.   Doreatha Massed, MD   7/2/20246:20 PM  CHIEF COMPLAINT:   Diagnosis: left lower lobe small cell carcinoma    Cancer Staging  Small cell lung cancer, left lower lobe (HCC) Staging form: Lung, AJCC 8th Edition - Clinical stage from 08/30/2022: Stage IVB (cT3, cN1, cM1c) - Signed  by Doreatha Massed, MD on 09/11/2022    Prior Therapy: none  Current Therapy: Chemoimmunotherapy   HISTORY OF PRESENT ILLNESS:   Oncology History  Small cell lung cancer, left lower lobe (HCC)  08/30/2022 Initial Diagnosis   Small cell lung cancer, left lower lobe (HCC)   08/30/2022 Cancer Staging   Staging form: Lung, AJCC 8th Edition - Clinical stage from 08/30/2022: Stage IVB (cT3, cN1, cM1c) - Signed by Doreatha Massed, MD  on 09/11/2022 Histopathologic type: Small cell carcinoma, NOS Stage prefix: Initial diagnosis      INTERVAL HISTORY:   Edward Campos is a 89 y.o. male presenting to clinic today for follow up of left lower lobe small cell carcinoma. He was last seen by me on 08/31/22 in consultation.  Since his last visit, he underwent staging PET scan on 09/06/22 showing: central LLL primary bronchogenic carcinoma with ipsilateral hilar and widespread osseous metastasis; marked left-sided prostatic hypermetabolism; mild rectal hypermetabolism without CT correlate; resolution of small left pleural effusion.  He also underwent brain MRI on 09/07/22.   He is scheduled for port placement on 09/17/22.  Today, he states that he is doing well overall. His appetite level is at 100%. His energy level is at 50%.  PAST MEDICAL HISTORY:   Past Medical History: Past Medical History:  Diagnosis Date   AAA (abdominal aortic aneurysm) (HCC)    Anxiety    Arthritis    BPH (benign prostatic hyperplasia)    Dizziness    Hyperlipidemia     Surgical History: Past Surgical History:  Procedure Laterality Date   ABDOMINAL AORTOGRAM W/LOWER EXTREMITY N/A 05/06/2019   Procedure: ABDOMINAL AORTOGRAM W/LOWER EXTREMITY;  Surgeon: Cephus Shelling, MD;  Location: [facility] INVASIVE [facility] LAB;  Service: Cardiovascular;  Laterality: N/A;   ABDOMINAL WOUND DEHISCENCE N/A 08/22/2022   Procedure: WASHOUT ABDOMEN WOUND AND VAC CHANGE;  Surgeon: Cephus Shelling, MD;  Location: [facility] OR;  Service: Vascular;  Laterality: N/A;   APPLICATION OF WOUND VAC  07/06/2019   Procedure: Application Of Wound Vac Left Groin;  Surgeon: Sherren Kerns, MD;  Location: [facility] OR;  Service: Vascular;;   APPLICATION OF WOUND VAC N/A 08/16/2022   Procedure: APPLICATION OF WOUND VAC;  Surgeon: [name], MD;  Location: [facility] OR;  Service: Vascular;  Laterality: N/A;   BRONCHIAL BIOPSY  08/21/2022   Procedure: BRONCHIAL BIOPSIES;  Surgeon: Oretha Milch, MD;   Location: [facility] ENDOSCOPY;  Service: Cardiopulmonary;;   BRONCHIAL BRUSHINGS  08/21/2022   Procedure: BRONCHIAL BRUSHINGS;  Surgeon: Oretha Milch, MD;  Location: [facility] ENDOSCOPY;  Service: Cardiopulmonary;;   BRONCHIAL WASHINGS  08/21/2022   Procedure: BRONCHIAL WASHINGS;  Surgeon: Oretha Milch, MD;  Location: [facility] ENDOSCOPY;  Service: Cardiopulmonary;;   CARDIOVERSION N/A 11/03/2020   Procedure: CARDIOVERSION;  Surgeon: Thurmon Fair, MD;  Location: [facility] ENDOSCOPY;  Service: Cardiovascular;  Laterality: N/A;   ENDARTERECTOMY FEMORAL Bilateral 06/08/2019   Procedure: ENDARTERECTOMY RIGHT COMMON FEMORAL; ENDARTERECTOMY RIGHT EXTERNAL ILIAC; ENDARTERECTOMY LEFT COMMON FEMORAL; ENDARTERECTOMY LEFT EXTERNAL ILIAC;  Surgeon: Sherren Kerns, MD;  Location: [facility] OR;  Service: Vascular;  Laterality: Bilateral;   FEMORAL-FEMORAL BYPASS GRAFT Bilateral 06/08/2019   Procedure: BYPASS GRAFT FEMORAL-FEMORAL ARTERY;  Surgeon: Sherren Kerns, MD;  Location: [facility] OR;  Service: Vascular;  Laterality: Bilateral;   GROIN DEBRIDEMENT Left 07/06/2019   Procedure: INCISION AND DRAINAGE OF LEFT GROIN;  Surgeon: Sherren Kerns, MD;  Location: [facility] OR;  Service: Vascular;  Laterality: Left;   growth removed from chest  HEMORRHOID SURGERY     HEMOSTASIS CONTROL  08/21/2022   Procedure: HEMOSTASIS CONTROL;  Surgeon: Oretha Milch, MD;  Location: [facility] ENDOSCOPY;  Service: Cardiopulmonary;;   HERNIA REPAIR  12 yrs ago   left inguinal hernia   INCISE AND DRAIN ABCESS Left 07/06/2019   Incision and drainage left groin placement of VAC dressing   INCISION AND DRAINAGE OF WOUND Right 08/16/2022   Procedure: IRRIGATION AND DEBRIDEMENT WOUND;  Surgeon: [name], MD;  Location: [facility] OR;  Service: Vascular;  Laterality: Right;   INGUINAL HERNIA REPAIR  10/16/2010   Procedure: HERNIA REPAIR INGUINAL ADULT;  Surgeon: Dalia Heading;  Location: [facility] ORS;  Service: General;  Laterality: Right;   INSERTION OF ILIAC STENT Right  06/08/2019   Procedure: INSERTION OF RIGHT COMMON ILIAC AND EXTERNAL ILICAC STENT;  Surgeon: Sherren Kerns, MD;  Location: [facility] OR;  Service: Vascular;  Laterality: Right;   PATCH ANGIOPLASTY Right 06/08/2019   Procedure: Patch Angioplasty Right Common Femoral;  Surgeon: Sherren Kerns, MD;  Location: [facility] OR;  Service: Vascular;  Laterality: Right;   VIDEO BRONCHOSCOPY N/A 08/21/2022   Procedure: VIDEO BRONCHOSCOPY WITH FLUORO;  Surgeon: Oretha Milch, MD;  Location: [facility]COPY;  Service: Cardiopulmonary;  Laterality: N/A;    Social History: Social History   Socioeconomic History   Marital status: Divorced    Spouse name: Not on file   Number of children: 3   Years of education: Not on file   Highest education level: Not on file  Occupational History   Not on file  Tobacco Use   Smoking status: Former    Packs/day: 1.50    Years: 70.00    Additional pack years: 0.00    Total pack years: 105.00    Types: Cigarettes    Quit date: 05/30/2019    Years since quitting: 3.2   Smokeless tobacco: Never  Vaping Use   Vaping Use: Never used  Substance and Sexual Activity   Alcohol use: Yes    Alcohol/week: 3.0 standard drinks of alcohol    Types: 3 Shots of liquor per week    Comment: 3 2oz. shots of bourbon/day   Drug use: No   Sexual activity: Not on file  Other Topics Concern   Not on file  Social History Narrative   Not on file   Social Determinants of Health   Financial Resource Strain: Not on file  Food Insecurity: No Food Insecurity (08/20/2022)   Hunger Vital Sign    Worried About Running Out of Food in the Last Year: Never true    Ran Out of Food in the Last Year: Never true  Transportation Needs: No Transportation Needs (08/20/2022)   PRAPARE - Administrator, Civil Service (Medical): No    Lack of Transportation (Non-Medical): No  Physical Activity: Not on file  Stress: Not on file  Social Connections: Not on file  Intimate Partner Violence: Not At  Risk (08/20/2022)   Humiliation, Afraid, Rape, and Kick questionnaire    Fear of Current or Ex-Partner: No    Emotionally Abused: No    Physically Abused: No    Sexually Abused: No    Family History: Family History  Problem Relation Age of Onset   Stroke Mother    Pseudochol deficiency Neg Hx    Malignant hyperthermia Neg Hx    Hypotension Neg Hx    Anesthesia problems Neg Hx     Current Medications:  Current Outpatient Medications:  aspirin EC 81 MG tablet, Take 1 tablet (81 mg total) by mouth daily. Swallow whole., Disp: 90 tablet, Rfl: 3   diphenhydramine-acetaminophen (TYLENOL PM) 25-500 MG TABS tablet, Take 1 tablet by mouth at bedtime as needed (Sleep). (Patient not taking: Reported on 09/06/2022), Disp: , Rfl:    ibuprofen (ADVIL) 200 MG tablet, Take 400 mg by mouth every 6 (six) hours as needed. (Patient not taking: Reported on 09/06/2022), Disp: , Rfl:    Multiple Vitamins-Minerals (MULTIVITAMIN WITH MINERALS) tablet, Take 1 tablet by mouth daily., Disp: , Rfl:    rosuvastatin (CRESTOR) 20 MG tablet, TAKE 1 TABLET(20 MG) BY MOUTH DAILY, Disp: 90 tablet, Rfl: 12   Simethicone (GAS RELIEF EXTRA STRENGTH PO), Take 2 tablets by mouth daily as needed (gas)., Disp: , Rfl:    Allergies: Allergies  Allergen Reactions   Penicillins Other (See Comments)    UNSPECIFIED REACTION  Did it involve swelling of the face/tongue/throat, SOB, or low BP? Unknown Did it involve sudden or severe rash/hives, skin peeling, or any reaction on the inside of your mouth or nose? Unknown Did you need to seek medical attention at a hospital or doctor's office? Unknown When did it last happen? Between ages 3-13   If all above answers are "NO", may proceed with cephalosporin use.     REVIEW OF SYSTEMS:   Review of Systems  Constitutional:  Negative for chills, fatigue and fever.  HENT:   Negative for lump/mass, mouth sores, nosebleeds, sore throat and trouble swallowing.   Eyes:  Negative for  eye problems.  Respiratory:  Positive for shortness of breath. Negative for cough.   Cardiovascular:  Negative for chest pain, leg swelling and palpitations.  Gastrointestinal:  Positive for diarrhea. Negative for abdominal pain, constipation, nausea and vomiting.  Genitourinary:  Negative for bladder incontinence, difficulty urinating, dysuria, frequency, hematuria and nocturia.   Musculoskeletal:  Negative for arthralgias, back pain, flank pain, myalgias and neck pain.  Skin:  Negative for itching and rash.  Neurological:  Negative for dizziness, headaches and numbness.  Hematological:  Does not bruise/bleed easily.  Psychiatric/Behavioral:  Negative for depression, sleep disturbance and suicidal ideas. The patient is not nervous/anxious.   All other systems reviewed and are negative.    VITALS:   Blood pressure 134/80, pulse 62, temperature 97.7 F (36.5 C), temperature source Oral, resp. rate 18, height 5' 8.5" (1.74 m), weight 171 lb 9.6 oz (77.8 kg), SpO2 99 %.  Wt Readings from Last 3 Encounters:  09/11/22 171 lb 9.6 oz (77.8 kg)  09/07/22 174 lb (78.9 kg)  09/06/22 174 lb 6.4 oz (79.1 kg)    Body mass index is 25.71 kg/m.  Performance status (ECOG): 1 - Symptomatic but completely ambulatory  PHYSICAL EXAM:   Physical Exam Vitals and nursing note reviewed. Exam conducted with a chaperone present.  Constitutional:      Appearance: Normal appearance.  Cardiovascular:     Rate and Rhythm: Normal rate and regular rhythm.     Pulses: Normal pulses.     Heart sounds: Normal heart sounds.  Pulmonary:     Effort: Pulmonary effort is normal.     Breath sounds: Normal breath sounds.  Abdominal:     Palpations: Abdomen is soft. There is no hepatomegaly, splenomegaly or mass.     Tenderness: There is no abdominal tenderness.  Musculoskeletal:     Right lower leg: No edema.     Left lower leg: No edema.  Lymphadenopathy:     Cervical:  No cervical adenopathy.     Right  cervical: No superficial, deep or posterior cervical adenopathy.    Left cervical: No superficial, deep or posterior cervical adenopathy.     Upper Body:     Right upper body: No supraclavicular or axillary adenopathy.     Left upper body: No supraclavicular or axillary adenopathy.  Neurological:     General: No focal deficit present.     Mental Status: He is alert and oriented to person, place, and time.  Psychiatric:        Mood and Affect: Mood normal.        Behavior: Behavior normal.     LABS:      Latest Ref Rng & Units 08/20/2022    1:52 AM 08/19/2022    1:58 AM 08/18/2022    1:51 AM  CBC  WBC 4.0 - 10.5 K/uL 6.4  6.0  6.7   Hemoglobin 13.0 - 17.0 g/dL 40.9  81.1  91.4   Hematocrit 39.0 - 52.0 % 37.1  36.5  34.8   Platelets 150 - 400 K/uL 186  193  183       Latest Ref Rng & Units 08/22/2022   12:43 AM 08/17/2022    9:52 AM 08/16/2022    6:37 AM  CMP  Glucose 70 - 99 mg/dL  88  782   BUN 8 - 23 mg/dL  25  25   Creatinine 9.56 - 1.24 mg/dL 2.13  0.86  5.78   Sodium 135 - 145 mmol/L  133  133   Potassium 3.5 - 5.1 mmol/L  3.9  4.4   Chloride 98 - 111 mmol/L  103  99   CO2 22 - 32 mmol/L  23  26   Calcium 8.9 - 10.3 mg/dL  8.7  8.8   Total Protein 6.5 - 8.1 g/dL  6.1  6.2   Total Bilirubin 0.3 - 1.2 mg/dL  1.1  1.6   Alkaline Phos 38 - 126 U/L  78  79   AST 15 - 41 U/L  23  29   ALT 0 - 44 U/L  22  27      No results found for: "CEA1", "CEA" / No results found for: "CEA1", "CEA" No results found for: "PSA1" No results found for: "ION629" No results found for: "CAN125"  No results found for: "TOTALPROTELP", "ALBUMINELP", "A1GS", "A2GS", "BETS", "BETA2SER", "GAMS", "MSPIKE", "SPEI" No results found for: "TIBC", "FERRITIN", "IRONPCTSAT" No results found for: "LDH"   STUDIES:   NM PET Image Initial (PI) Skull Base To Thigh  Result Date: 09/10/2022 CLINICAL DATA:  Initial treatment strategy for small-cell lung cancer, staging. EXAM: NUCLEAR MEDICINE PET SKULL BASE TO  THIGH TECHNIQUE: 9.0 mCi F-18 FDG was injected intravenously. Full-ring PET imaging was performed from the skull base to thigh after the radiotracer. CT data was obtained and used for attenuation correction and anatomic localization. Fasting blood glucose: 104 mg/dl COMPARISON:  CTA's of the chest abdomen and pelvis of 08/19/2022. FINDINGS: Mediastinal blood pool activity: SUV max 2.3 Liver activity: SUV max NA NECK: No areas of abnormal hypermetabolism. Incidental CT findings: Bilateral carotid atherosclerosis. No cervical adenopathy. Cerebral atrophy. CHEST: Left hilar node measures 2.0 cm and a S.U.V. max of 6.8 on 108/3. Central left lower lobe primary with direct mediastinal invasion. On the order of 4.4 x 4.4 cm and a S.U.V. max of 9.4 on 118/3. Incidental CT findings: Deferred to recent diagnostic CT. Mild postobstructive pneumonitis and mucoid impaction. Small  left pleural effusion has resolved. Centrilobular emphysema. Aortic and coronary artery calcification. ABDOMEN/PELVIS: No adrenal hypermetabolism. No abdominopelvic nodal hypermetabolism. Focal hypermetabolism within the mid rectum is without CT correlate, measuring a S.U.V. max of 8.2 on approximately image 231/3. Extensive left-sided prostatic hypermetabolism including at a S.U.V. max of 22.3. Incidental CT findings: Deferred to recent diagnostic CT. Infrarenal abdominal aortic aneurysm at 5.0 cm. Gallstones. Renal vascular calcifications. Right common iliac artery stent with femoral/femoral bypass. SKELETON: Widespread osseous metastasis are relatively CT occult. Examples within the upper sacrum at a S.U.V. max of 8.2, L1 at a S.U.V. max of 6.6. Incidental CT findings: None. IMPRESSION: 1. Central left lower lobe primary bronchogenic carcinoma with ipsilateral hilar and widespread osseous metastasis. 2. Marked left-sided prostatic hypermetabolism. Consider correlation with PSA and physical exam. 3. Mid rectal hypermetabolism without CT correlate.  This could be physiologic or represent an occult polyp/neoplasm. Of questionable clinical significance given age and comorbidities. 4. Resolution of small left pleural effusion. 5. Incidental findings, including: Aortic atherosclerosis (ICD10-I70.0), coronary artery atherosclerosis and emphysema (ICD10-J43.9). Cholelithiasis. 6. Abdominal aortic aneurysm as on dedicated CTA of 08/16/2022. Electronically Signed   By: Jeronimo Greaves M.D.   On: 09/10/2022 16:41   CT ANGIO CHEST AORTA W/CM & OR WO/CM  Result Date: 08/19/2022 CLINICAL DATA:  Inpatient. AAA. Possible left infrahilar lung mass on recent CT angiogram of the abdomen. * Tracking Code: BO * EXAM: CT ANGIOGRAPHY CHEST WITH CONTRAST TECHNIQUE: Multidetector CT imaging of the chest was performed using the standard protocol during bolus administration of intravenous contrast. Multiplanar CT image reconstructions and MIPs were obtained to evaluate the vascular anatomy. RADIATION DOSE REDUCTION: This exam was performed according to the departmental dose-optimization program which includes automated exposure control, adjustment of the mA and/or kV according to patient size and/or use of iterative reconstruction technique. CONTRAST:  75mL OMNIPAQUE IOHEXOL 300 MG/ML  SOLN COMPARISON:  08/16/2022 CT angiogram of the abdomen and pelvis. FINDINGS: Cardiovascular: Normal heart size. No significant pericardial effusion/thickening. Three-vessel coronary atherosclerosis. Atherosclerotic thoracic aorta with dilated 4.1 cm ascending thoracic aorta. Normal caliber pulmonary arteries. No central pulmonary emboli. Mediastinum/Nodes: No significant thyroid nodules. Small air-fluid level in the mildly patulous midthoracic esophagus. No axillary adenopathy. No pathologically enlarged mediastinal nodes. Enlarged 2.0 cm short axis diameter left hilar node (series 5/image 58). No right hilar adenopathy. Lungs/Pleura: No pneumothorax. Small layering left pleural effusion. No right  pleural effusion. Moderate centrilobular emphysema with mild diffuse bronchial wall thickening. Irregular infiltrative 6.0 x 4.7 cm solid central left lower lobe lung mass (series 6/image 67), which occludes multiple segmental airways in the central left lower lobe and which encases and narrows the associated left lower lobe pulmonary artery branches. Patchy mild consolidation and ground-glass opacity in the basilar left lower lobe, favor postobstructive change. No additional significant pulmonary nodules. Upper abdomen: No acute abnormality. Musculoskeletal: No aggressive appearing focal osseous lesions. Marked thoracic spondylosis. Review of the MIP images confirms the above findings. IMPRESSION: 1. Irregular infiltrative 6.0 cm solid central left lower lobe lung mass, highly suspicious for primary bronchogenic carcinoma. Multidisciplinary thoracic oncology consultation is advised. Outpatient PET-CT recommended for further staging. 2. Enlarged left hilar lymph node, compatible with ipsilateral nodal metastasis. 3. Small layering left pleural effusion. 4. Three-vessel coronary atherosclerosis. 5. Small air-fluid level in the mildly patulous midthoracic esophagus, suggesting esophageal dysmotility and/or gastroesophageal reflux. 6. Moderate centrilobular emphysema with mild diffuse bronchial wall thickening, suggesting COPD. 7. Dilated 4.1 cm ascending thoracic aorta. Recommend annual imaging followup by CTA  or MRA. This recommendation follows 2010 ACCF/AHA/AATS/ACR/ASA/SCA/SCAI/SIR/STS/SVM Guidelines for the Diagnosis and Management of Patients with Thoracic Aortic Disease. Circulation. 2010; 121: Z610-R604. Aortic aneurysm NOS (ICD10-I71.9). 8.  Emphysema (ICD10-J43.9). These results will be called to the ordering clinician or representative by the Radiologist Assistant, and communication documented in the PACS or [employer]. Electronically Signed   By: Delbert Phenix M.D.   On: 08/19/2022 14:13   CT Angio  Abd/Pel w/ and/or w/o  Result Date: 08/16/2022 CLINICAL DATA:  Abdominal aortic aneurysm, preoperative planning. EXAM: CTA ABDOMEN AND PELVIS WITHOUT AND WITH CONTRAST TECHNIQUE: Multidetector CT imaging of the abdomen and pelvis was performed using the standard protocol during bolus administration of intravenous contrast. Multiplanar reconstructed images and MIPs were obtained and reviewed to evaluate the vascular anatomy. RADIATION DOSE REDUCTION: This exam was performed according to the departmental dose-optimization program which includes automated exposure control, adjustment of the mA and/or kV according to patient size and/or use of iterative reconstruction technique. CONTRAST:  OMNIPAQUE IOHEXOL 350 MG/ML SOLN COMPARISON:  03/03/2019 FINDINGS: VASCULAR Aorta: Infrarenal abdominal aortic aneurysm measures up to 5.7 cm and previously measured 4.7 cm. Infrarenal abdominal aortic aneurysm terminates above the aortic bifurcation. Small amount of mural thrombus associated with this aneurysm. Normal caliber of the distal descending thoracic aorta. Juxtarenal abdominal aorta measures approximately 2.7 cm. Diffuse atherosclerotic calcifications in the abdominal aorta. Celiac: Calcified plaque at the origin of the celiac trunk probably causing greater than 50% stenosis but difficult to assess the degree of stenosis. Main branches of the celiac trunk are patent. Incidentally, there is a prominent accessory left hepatic artery coming off the left gastric artery. SMA: Patent without evidence of aneurysm, dissection, vasculitis or significant stenosis. Renals: Single left renal artery with at least 50% stenosis at the origin. Two right renal arteries. The proximal right renal artery is widely patent. High-grade stenosis involving the more distal right renal artery. No evidence for aneurysm or dissection involving the renal arteries. IMA: IMA is patent. However, the origin of the IMA is poorly characterized due to  the atherosclerotic disease in the abdominal aorta. Concern for short segment occlusion or stenosis at the origin. Inflow: Right common iliac artery is patent with atherosclerotic calcifications. Stents in the right external iliac artery are patent. No significant flow in the proximal right internal iliac artery. Left common iliac artery is very tortuous and there is a saccular aneurysm coming off medial aspect of the left common iliac artery that measures approximately 1.2 cm on image 80/5. This saccular aneurysm is similar to the exam from 2020. Distal left common iliac artery measures 1.5 cm and stable. Proximal left internal iliac artery measures 1.1 cm and stable. Left external iliac artery is chronically occluded just beyond its origin. Femoral to femoral bypass graft is patent. Postoperative changes along the right side of the femoral bypass compatible with recent surgical debridement and placement a wound VAC. Subcutaneous edema along the bypass graft. Proximal Outflow: Surgical changes in the left common femoral artery. The proximal left femoral arteries are patent. High-density material around the left common femoral artery which may be related to postoperative changes. Right common femoral artery is patent and there is flow in the right profunda femoral arteries. However, there is stenosis and occlusion involving the proximal right SFA. Veins: IVC and renal veins are patent. Proximal iliac veins appear to be patent. Main portal venous system is patent. Main hepatic veins are patent. Review of the MIP images confirms the above findings. NON-VASCULAR Lower  chest: Concern for mucoid impaction involving multiple left lower lobe airway branches. Cannot exclude a mass in the left infrahilar region that roughly measures 5.2 x 4.2 cm. Evidence for underlying emphysema. Probable tiny calcified granuloma in the periphery of the left lower lobe on image 16/13. No large pleural effusions. Hepatobiliary: Normal  appearance of the liver and gallbladder. No suspicious hepatic lesions. No biliary dilatation. Pancreas: Unremarkable. No pancreatic ductal dilatation or surrounding inflammatory changes. Spleen: Normal in size without focal abnormality. Adrenals/Urinary Tract: Normal adrenal glands. No suspicious renal lesions. No hydronephrosis. Normal appearance of the urinary bladder. Stomach/Bowel: Stomach is within normal limits. No significant bowel dilatation. No evidence for bowel obstruction or focal bowel inflammation. Small focus of enhancement along the posterior aspect of the right colon on image 49/11 is nonspecific. Not clear if this is related to a prominent vein. Lymphatic: High-density material around the femoral bypass and the proximal femoral arteries is likely postoperative change. No significant lymph node enlargement in the abdomen or pelvis. Reproductive: Heterogeneous enhancement of the prostate is nonspecific. Other: There is probably trace fluid in the pelvis which is nonspecific. Negative for free air. Musculoskeletal: Multilevel degenerative disc disease throughout the lumbar spine. Prominent disc space narrowing at L4-L5. Multilevel degenerative facet disease in the lumbar spine. IMPRESSION: VASCULAR 1. Infrarenal abdominal aortic aneurysm measures up to 5.7 cm and measured 4.7 cm in 2020. No evidence for aortic rupture. No evidence for an aortic dissection. 2. Diffuse atherosclerotic disease in the aorta and iliac arteries. 3. 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 4. Stented right external iliac artery is patent. 5. 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 6. Postoperative changes associated with the femoral to femoral bypass graft as described. Bypass graft is patent. 7. 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 8. 2 right renal arteries. Evidence for severe stenosis involving the more distal right renal  artery. 9. Stable left renal artery ostial stenosis. 10. IMA is patent but suspect stenosis or occlusion at the origin. 11. Occlusive disease in the right SFA. NON-VASCULAR 1. Probable mucoid impaction involving left lower lobe airways. A left infrahilar lung mass cannot be excluded. Recommend further characterization with dedicated chest CT with IV contrast. 2. Question trace fluid in the pelvis which is nonspecific. These results will be called to the ordering clinician or representative by the Radiologist Assistant, and communication documented in the PACS or [employer]. Electronically Signed   By: Richarda Overlie M.D.   On: 08/16/2022 17:01   VAS [place] LOWER EXTREMITY BYPASS GRAFT DUPL  Result Date: 08/15/2022 LOWER EXTREMITY ARTERIAL DUPLEX STUDY Patient Name:  Edward Campos  Date of Exam:   08/15/2022 Medical Rec #: [id number]       Accession #:    [id number] Date of Birth: 12-May-1932        Patient Gender: M Patient Age:   23 years Exam Location:  [facility] Procedure:      VAS [place] LOWER EXTREMITY BYPASS GRAFT DUPLEX Referring Phys: Lemar Livings --------------------------------------------------------------------------------  Indications: Peripheral artery disease. High Risk Factors: Hyperlipidemia. Other Factors: AAA                Quit smoking May 30, 2019.  Vascular Interventions: Right common femoral external iliac endarterectomy,                         right common (9 x 39 VBX)  and external iliac stent 8 x                         59 VBX, 8 x 40 Inova, right to left femoral-femoral                         bypass, left external iliac common femoral                         endarterectomy, patch angioplasty right common femoral                         artery Dacron on 06/08/19. Current ABI:            R 0.82 L 0.83 Performing Technologist: [place] Ponder RVS  Examination Guidelines: A complete evaluation includes B-mode imaging, spectral Doppler, color Doppler, and power Doppler as needed  of all accessible portions of each vessel. Bilateral testing is considered an integral part of a complete examination. Limited examinations for reoccurring indications may be performed as noted.   Right Graft #1: Fem-Fem +------------------+--------+---------------+--------+--------+                   PSV cm/sStenosis       WaveformComments +------------------+--------+---------------+--------+--------+ Inflow            303     75-99% stenosisbiphasic         +------------------+--------+---------------+--------+--------+ Prox Anastomosis  180                    biphasic         +------------------+--------+---------------+--------+--------+ Proximal Graft    62                     biphasic         +------------------+--------+---------------+--------+--------+ Mid Graft         48                     biphasic         +------------------+--------+---------------+--------+--------+ Distal Graft      53                     biphasic         +------------------+--------+---------------+--------+--------+ Distal Anastomosis46                     biphasic         +------------------+--------+---------------+--------+--------+ Outflow           33                     biphasic         +------------------+--------+---------------+--------+--------+   Summary: Left: 75-99% stenosis noted in the inflow, otherwise bypass apears patent. 3.74 cm x 4.13 cm structure noted at the proximal graft with mixed echoes anterior to the graft.   See table(s) above for measurements and observations. Electronically signed by Lemar Livings MD on 08/15/2022 at 4:53:40 PM.    Final    VAS US AORTA/IVC/ILIACS  Result Date: 08/15/2022 ABDOMINAL AORTA STUDY Patient Name:  Edward Campos  Date of Exam:   08/15/2022 Medical Rec #: [id number]       Accession #:    [id number] Date of Birth: 03/21/1932        Patient Gender: M Patient Age:   45 years Exam  Location:  [facility] Procedure:       VAS US AORTA/IVC/ILIACS Referring Phys: Lemar Livings --------------------------------------------------------------------------------  Indications: Follow up exam for known AAA. Risk Factors: Hyperlipidemia. Vascular Interventions: Right common femoral external iliac endarterectomy,                         right common (9 x 39 VBX) and external iliac stent 8 x                         59 VBX, 8 x 40 Inova, right to left femoral-femoral                         bypass, left external iliac common femoral                         endarterectomy, patch angioplasty right common femoral                         artery Dacron on 06/08/19. Limitations: Air/bowel gas and patient not NPO.  Comparison Study: 12/07/20 prior Performing Technologist: [place] Ponder RVS  Examination Guidelines: A complete evaluation includes B-mode imaging, spectral Doppler, color Doppler, and power Doppler as needed of all accessible portions of each vessel. Bilateral testing is considered an integral part of a complete examination. Limited examinations for reoccurring indications may be performed as noted.  Abdominal Aorta Findings: +-----------+-------+----------+----------+--------+--------+--------+ Location   AP (cm)Trans (cm)PSV (cm/s)WaveformThrombusComments +-----------+-------+----------+----------+--------+--------+--------+ Proximal   4.93   4.99      63                                 +-----------+-------+----------+----------+--------+--------+--------+ Mid        5.11   5.93      53                                 +-----------+-------+----------+----------+--------+--------+--------+ Distal     5.24   6.63      38                                 +-----------+-------+----------+----------+--------+--------+--------+ RT CIA Prox1.1    1.5       141                                +-----------+-------+----------+----------+--------+--------+--------+ RT EIA Prox                 195                                 +-----------+-------+----------+----------+--------+--------+--------+ LT CIA Prox                                           NV       +-----------+-------+----------+----------+--------+--------+--------+  Summary: Abdominal Aorta: There is evidence of abnormal dilatation of the distal Abdominal aorta. The largest aortic measurement is 6.6 cm. The largest aortic diameter has increased compared  to prior exam. Previous diameter measurement was 4.7 cm obtained on 12/07/20.  *See table(s) above for measurements and observations.  Electronically signed by Lemar Livings MD on 08/15/2022 at 4:53:21 PM.    Final    VAS [place] ABI WITH/WO TBI  Result Date: 08/15/2022  LOWER EXTREMITY DOPPLER STUDY Patient Name:  Edward Campos  Date of Exam:   08/15/2022 Medical Rec #: [id number]       Accession #:    [id number] Date of Birth: 09/24/1932        Patient Gender: M Patient Age:   89 years Exam Location:  [facility] Procedure:      VAS [place] ABI WITH/WO TBI Referring Phys: Lemar Livings --------------------------------------------------------------------------------  Indications: Peripheral artery disease. High Risk Factors: Hyperlipidemia. Other Factors: AAA                Quit smoking May 30, 2019.  Vascular Interventions: Right common femoral external iliac endarterectomy,                         right common (9 x 39 VBX) and external iliac stent 8 x                         59 VBX, 8 x 40 Inova, right to left femoral-femoral                         bypass, left external iliac common femoral                         endarterectomy, patch angioplasty right common femoral                         artery Dacron on 06/08/19. Comparison Study: 12/07/20 prior Performing Technologist: [place] Ponder RVS  Examination Guidelines: A complete evaluation includes at minimum, Doppler waveform signals and systolic blood pressure reading at the level of bilateral brachial, anterior tibial, and posterior tibial  arteries, when vessel segments are accessible. Bilateral testing is considered an integral part of a complete examination. Photoelectric Plethysmograph (PPG) waveforms and toe systolic pressure readings are included as required and additional duplex testing as needed. Limited examinations for reoccurring indications may be performed as noted.  ABI Findings: +---------+------------------+-----+----------+--------+ Right    Rt Pressure (mmHg)IndexWaveform  Comment  +---------+------------------+-----+----------+--------+ Brachial 168                                       +---------+------------------+-----+----------+--------+ PTA      137               0.82 monophasic         +---------+------------------+-----+----------+--------+ DP       122               0.73 monophasic         +---------+------------------+-----+----------+--------+ Great Toe76                0.45 Abnormal           +---------+------------------+-----+----------+--------+ +---------+------------------+-----+----------+-------+ Left     Lt Pressure (mmHg)IndexWaveform  Comment +---------+------------------+-----+----------+-------+ Brachial 157                                      +---------+------------------+-----+----------+-------+  PTA      137               0.82 monophasic        +---------+------------------+-----+----------+-------+ DP       139               0.83 monophasic        +---------+------------------+-----+----------+-------+ Great Toe116               0.69 Abnormal          +---------+------------------+-----+----------+-------+ +-------+-----------+-----------+------------+------------+ ABI/TBIToday's ABIToday's TBIPrevious ABIPrevious TBI +-------+-----------+-----------+------------+------------+ Right  0.82       0.45       0.79        0.40         +-------+-----------+-----------+------------+------------+ Left   0.83       0.69       0.73         0.53         +-------+-----------+-----------+------------+------------+ Bilateral TBIs appear essentially unchanged compared to prior study on 12/07/20.  Summary: Right: Resting right ankle-brachial index indicates mild right lower extremity arterial disease. The right toe-brachial index is abnormal. Left: Resting left ankle-brachial index indicates mild left lower extremity arterial disease. The left toe-brachial index is abnormal. *See table(s) above for measurements and observations.  Electronically signed by Lemar Livings MD on 08/15/2022 at 10:23:00 AM.    Final

## 2022-09-11 NOTE — Patient Instructions (Signed)
[facility] at [facility] Discharge Instructions   You were seen and examined today by Dr. Ellin Saba.  He reviewed the results of your PET scan. It is showing the cancer in the lung is lighting up. It is also showing areas of cancer in multiple places in your bones. Your prostate is also lighting up intensely on this scan. It is unclear if the bone spots are coming from the prostate or the lung.   We will check a PSA (this is a screening test for prostate cancer) today. If it is elevated, we will need to get a biopsy of one of the bone lesions to determine if the cancer in the bones is coming from the prostate or the lung.   We will arrange for your follow up based on the lab results today. We will call you once we know the plan.    Thank you for choosing [facility] at [facility] to provide your oncology and hematology care.  To afford each patient quality time with our provider, please arrive at least 15 minutes before your scheduled appointment time.   If you have a lab appointment with the Cancer Center please come in thru the Main Entrance and check in at the main information desk.  You need to re-schedule your appointment should you arrive 10 or more minutes late.  We strive to give you quality time with our providers, and arriving late affects you and other patients whose appointments are after yours.  Also, if you no show three or more times for appointments you may be dismissed from the clinic at the providers discretion.     Again, thank you for choosing [facility].  Our hope is that these requests will decrease the amount of time that you wait before being seen by our physicians.       _____________________________________________________________  Should you have questions after your visit to [facility], please contact our office at [phone] and follow the prompts.  Our office hours are 8:00 a.m. and 4:30  p.m. Monday - Friday.  Please note that voicemails left after 4:00 p.m. may not be returned until the following business day.  We are closed weekends and major holidays.  You do have access to a nurse 24-7, just call the main number to the clinic [phone] and do not press any options, hold on the line and a nurse will answer the phone.    For prescription refill requests, have your pharmacy contact our office and allow 72 hours.    Due to Covid, you will need to wear a mask upon entering the hospital. If you do not have a mask, a mask will be given to you at the Main Entrance upon arrival. For doctor visits, patients may have 1 support person age 33 or older with them. For treatment visits, patients can not have anyone with them due to social distancing guidelines and our immunocompromised population.

## 2022-09-12 ENCOUNTER — Ambulatory Visit (INDEPENDENT_AMBULATORY_CARE_PROVIDER_SITE_OTHER): Payer: Medicare Other | Admitting: Physician Assistant

## 2022-09-12 VITALS — BP 129/74 | HR 67 | Temp 97.7°F | Wt 172.0 lb

## 2022-09-12 DIAGNOSIS — T827XXA Infection and inflammatory reaction due to other cardiac and vascular devices, implants and grafts, initial encounter: Secondary | ICD-10-CM

## 2022-09-12 NOTE — Progress Notes (Signed)
POST OPERATIVE OFFICE NOTE    CC:  F/u for surgery  HPI:  This is a 89 y.o. male who is s/p 1.  Irrigation and debridement with VAC change of wound overlying femoral-femoral bypass graft 2.  Application of 38 cm skin substitute using Kerecis to wound overlying femoral-femoral bypass (H8469) on 08/22/22 by Dr. Chestine Spore. Prior to this he underwent  1.  Sharp excisional debridement of skin and subcutaneous tissue overlying the femoral-femoral bypass graft to 5 x 2 x 3 cm and pulse evac lavage totaling 2 L 2.  Application 38 cm fish skin with negative pressure wound VAC dressing on 08/16/22 by Dr. Randie Heinz. He was indicated for this following history of right to left femorofemoral bypass presenting with evidence of infection of his femorofemoral bypass graft. He also has known history of AAA. He did well post operatively and was discharged home on 08/24/22. He was discharged on Flagyl and Cefpodoxime x 14 days.    Patient returns to clinic with his daughter due to malfunctioning wound VAC.  This is the second or third time wound VAC has malfunctioned.  Home health just change the wound VAC on Monday.  He also noticed more swelling along the path of the femorofemoral bypass.  He denies any fevers, chills, nausea/vomiting.  He also denies any bleeding.  He was seen by oncology yesterday and stated the cancer is untreatable.  He is currently deciding whether he wants to undergo palliative chemo or not.   Allergies  Allergen Reactions   Penicillins Other (See Comments)    UNSPECIFIED REACTION  Did it involve swelling of the face/tongue/throat, SOB, or low BP? Unknown Did it involve sudden or severe rash/hives, skin peeling, or any reaction on the inside of your mouth or nose? Unknown Did you need to seek medical attention at a hospital or doctor's office? Unknown When did it last happen? Between ages [age]   If all above answers are "NO", may proceed with cephalosporin use.     Current Outpatient Medications   Medication Sig Dispense Refill   aspirin EC 81 MG tablet Take 1 tablet (81 mg total) by mouth daily. Swallow whole. 90 tablet 3   diphenhydramine-acetaminophen (TYLENOL PM) 25-500 MG TABS tablet Take 1 tablet by mouth at bedtime as needed (Sleep).     ibuprofen (ADVIL) 200 MG tablet Take 400 mg by mouth every 6 (six) hours as needed.     Multiple Vitamins-Minerals (MULTIVITAMIN WITH MINERALS) tablet Take 1 tablet by mouth daily.     rosuvastatin (CRESTOR) 20 MG tablet TAKE 1 TABLET(20 MG) BY MOUTH DAILY 90 tablet 12   Simethicone (GAS RELIEF EXTRA STRENGTH PO) Take 2 tablets by mouth daily as needed (gas).     No current facility-administered medications for this visit.     ROS:  See HPI  Physical Exam:  Vitals:   09/12/22 1449  BP: 129/74  Pulse: 67  Temp: 97.7 F (36.5 C)  TempSrc: Temporal  SpO2: 99%  Weight: 172 lb (78 kg)    Incision: Yellowish drainage in the wound bed however there is granulation tissue; unable to express any further drainage with manipulation around the wound Extremities: Easily palpable femoral pulses  Assessment/Plan:  This is a 89 y.o. male with exposed femorofemoral bypass graft  -Unsure of etiology of increased swelling around the tunneled femorofemoral bypass.  No sign of bleeding.  Unable to express any further drainage with manipulation of the wound.  Dr. Randie Heinz evaluated the patient and the wound is  well today.  Hopefully we can continue home health dressing changes and avoid excision of femorofemoral bypass.  Patient is aware if he required removal of bypass, he will have an ischemic left leg.  Wound VAC was reapplied.  He will follow-up in 1 week for another dressing change with PA and Dr. Randie Heinz.   Emilie Rutter, PA-C [facility] [phone]  Clinic MD:  Randie Heinz

## 2022-09-14 DIAGNOSIS — I739 Peripheral vascular disease, unspecified: Secondary | ICD-10-CM | POA: Diagnosis not present

## 2022-09-14 DIAGNOSIS — C3490 Malignant neoplasm of unspecified part of unspecified bronchus or lung: Secondary | ICD-10-CM | POA: Diagnosis not present

## 2022-09-14 DIAGNOSIS — T827XXA Infection and inflammatory reaction due to other cardiac and vascular devices, implants and grafts, initial encounter: Secondary | ICD-10-CM | POA: Diagnosis not present

## 2022-09-14 DIAGNOSIS — Z7982 Long term (current) use of aspirin: Secondary | ICD-10-CM | POA: Diagnosis not present

## 2022-09-14 DIAGNOSIS — I7143 Infrarenal abdominal aortic aneurysm, without rupture: Secondary | ICD-10-CM | POA: Diagnosis not present

## 2022-09-17 ENCOUNTER — Other Ambulatory Visit (HOSPITAL_COMMUNITY): Payer: Medicare Other

## 2022-09-17 DIAGNOSIS — I739 Peripheral vascular disease, unspecified: Secondary | ICD-10-CM | POA: Diagnosis not present

## 2022-09-17 DIAGNOSIS — I7143 Infrarenal abdominal aortic aneurysm, without rupture: Secondary | ICD-10-CM | POA: Diagnosis not present

## 2022-09-17 DIAGNOSIS — T827XXA Infection and inflammatory reaction due to other cardiac and vascular devices, implants and grafts, initial encounter: Secondary | ICD-10-CM | POA: Diagnosis not present

## 2022-09-17 DIAGNOSIS — Z7982 Long term (current) use of aspirin: Secondary | ICD-10-CM | POA: Diagnosis not present

## 2022-09-17 DIAGNOSIS — C3490 Malignant neoplasm of unspecified part of unspecified bronchus or lung: Secondary | ICD-10-CM | POA: Diagnosis not present

## 2022-09-19 ENCOUNTER — Ambulatory Visit (INDEPENDENT_AMBULATORY_CARE_PROVIDER_SITE_OTHER): Payer: Medicare Other | Admitting: Physician Assistant

## 2022-09-19 VITALS — BP 122/76 | HR 72 | Temp 97.6°F | Resp 16 | Ht 69.0 in | Wt 171.0 lb

## 2022-09-19 DIAGNOSIS — I739 Peripheral vascular disease, unspecified: Secondary | ICD-10-CM

## 2022-09-19 NOTE — Progress Notes (Signed)
POST OPERATIVE OFFICE NOTE    CC:  F/u for surgery  HPI:  This is a 89 y.o. male who is s/p 1.  Irrigation and debridement with VAC change of wound overlying femoral-femoral bypass graft 2.  Application of 38 cm skin substitute using Kerecis to wound overlying femoral-femoral bypass (Z6109) on 08/22/22 by Dr. Chestine Spore. Prior to this he underwent  1.  Sharp excisional debridement of skin and subcutaneous tissue overlying the femoral-femoral bypass graft to 5 x 2 x 3 cm and pulse evac lavage totaling 2 L 2.  Application 38 cm fish skin with negative pressure wound VAC dressing on 08/16/22 by Dr. Randie Heinz. He was indicated for this following history of right to left femorofemoral bypass presenting with evidence of infection of his femorofemoral bypass graft. He also has known history of AAA. He did well post operatively and was discharged home on 08/24/22. He was discharged on Flagyl and Cefpodoxime x 14 days.   He returns to clinic today with his daughter.  He states wound VAC change from office visit last week held it seal.  He had the [facility] changed again by home health which seemed to work as well.  He denies any fevers, chills, nausea/vomiting.  He also denies any bleeding from the wound.  He canceled his appointment for port placement and will hold off on any treatment for lung cancer until his wound has healed.  Allergies  Allergen Reactions   Penicillins Other (See Comments)    UNSPECIFIED REACTION  Did it involve swelling of the face/tongue/throat, SOB, or low BP? Unknown Did it involve sudden or severe rash/hives, skin peeling, or any reaction on the inside of your mouth or nose? Unknown Did you need to seek medical attention at a hospital or doctor's office? Unknown When did it last happen? Between ages 34-13   If all above answers are "NO", may proceed with cephalosporin use.     Current Outpatient Medications  Medication Sig Dispense Refill   aspirin EC 81 MG tablet Take 1 tablet (81 mg  total) by mouth daily. Swallow whole. 90 tablet 3   diphenhydramine-acetaminophen (TYLENOL PM) 25-500 MG TABS tablet Take 1 tablet by mouth at bedtime as needed (Sleep).     Multiple Vitamins-Minerals (MULTIVITAMIN WITH MINERALS) tablet Take 1 tablet by mouth daily.     rosuvastatin (CRESTOR) 20 MG tablet TAKE 1 TABLET(20 MG) BY MOUTH DAILY 90 tablet 12   Simethicone (GAS RELIEF EXTRA STRENGTH PO) Take 2 tablets by mouth daily as needed (gas).     ibuprofen (ADVIL) 200 MG tablet Take 400 mg by mouth every 6 (six) hours as needed. (Patient not taking: Reported on 09/19/2022)     No current facility-administered medications for this visit.     ROS:  See HPI  Physical Exam:  Vitals:   09/19/22 1346  BP: 122/76  Pulse: 72  Resp: 16  Temp: 97.6 F (36.4 C)  TempSrc: Temporal  SpO2: 96%  Weight: 171 lb (77.6 kg)  Height: 5\' 9"  (1.753 m)     2 cm deep with exposed bypass at the wound bed  Assessment/Plan:  This is a 89 y.o. male with exposed femorofemoral bypass graft.  Wound appears to be shrinking in size now that we are no longer packing black foam tightly into the wound bed.  Wound VAC was changed in the office today.  He will continue VAC changes with home health for the next 2 weeks before returning to clinic.  He knows to  call/return office sooner with any questions or concerns.   Emilie Rutter, PA-C [facility] [phone]  Clinic MD:  Randie Heinz

## 2022-09-21 DIAGNOSIS — T827XXA Infection and inflammatory reaction due to other cardiac and vascular devices, implants and grafts, initial encounter: Secondary | ICD-10-CM | POA: Diagnosis not present

## 2022-09-21 DIAGNOSIS — Z7982 Long term (current) use of aspirin: Secondary | ICD-10-CM | POA: Diagnosis not present

## 2022-09-21 DIAGNOSIS — C3490 Malignant neoplasm of unspecified part of unspecified bronchus or lung: Secondary | ICD-10-CM | POA: Diagnosis not present

## 2022-09-21 DIAGNOSIS — I739 Peripheral vascular disease, unspecified: Secondary | ICD-10-CM | POA: Diagnosis not present

## 2022-09-21 DIAGNOSIS — I7143 Infrarenal abdominal aortic aneurysm, without rupture: Secondary | ICD-10-CM | POA: Diagnosis not present

## 2022-09-24 ENCOUNTER — Other Ambulatory Visit: Payer: Self-pay

## 2022-09-24 ENCOUNTER — Emergency Department (HOSPITAL_COMMUNITY): Payer: Medicare Other

## 2022-09-24 ENCOUNTER — Telehealth: Payer: Self-pay | Admitting: Adult Health

## 2022-09-24 ENCOUNTER — Inpatient Hospital Stay (HOSPITAL_COMMUNITY)
Admission: EM | Admit: 2022-09-24 | Discharge: 2022-09-28 | DRG: 871 | Disposition: A | Discharge status: Home Health | Payer: Medicare Other | Attending: Internal Medicine | Admitting: Internal Medicine

## 2022-09-24 DIAGNOSIS — I739 Peripheral vascular disease, unspecified: Secondary | ICD-10-CM | POA: Diagnosis present

## 2022-09-24 DIAGNOSIS — Z7982 Long term (current) use of aspirin: Secondary | ICD-10-CM | POA: Diagnosis not present

## 2022-09-24 DIAGNOSIS — I959 Hypotension, unspecified: Secondary | ICD-10-CM | POA: Diagnosis not present

## 2022-09-24 DIAGNOSIS — R0902 Hypoxemia: Secondary | ICD-10-CM | POA: Diagnosis present

## 2022-09-24 DIAGNOSIS — A419 Sepsis, unspecified organism: Principal | ICD-10-CM | POA: Diagnosis present

## 2022-09-24 DIAGNOSIS — N400 Enlarged prostate without lower urinary tract symptoms: Secondary | ICD-10-CM | POA: Diagnosis not present

## 2022-09-24 DIAGNOSIS — R578 Other shock: Secondary | ICD-10-CM | POA: Diagnosis not present

## 2022-09-24 DIAGNOSIS — I7143 Infrarenal abdominal aortic aneurysm, without rupture: Secondary | ICD-10-CM | POA: Diagnosis not present

## 2022-09-24 DIAGNOSIS — R6521 Severe sepsis with septic shock: Secondary | ICD-10-CM | POA: Diagnosis present

## 2022-09-24 DIAGNOSIS — K521 Toxic gastroenteritis and colitis: Secondary | ICD-10-CM | POA: Diagnosis not present

## 2022-09-24 DIAGNOSIS — J440 Chronic obstructive pulmonary disease with acute lower respiratory infection: Secondary | ICD-10-CM | POA: Diagnosis not present

## 2022-09-24 DIAGNOSIS — C3432 Malignant neoplasm of lower lobe, left bronchus or lung: Secondary | ICD-10-CM | POA: Diagnosis not present

## 2022-09-24 DIAGNOSIS — F419 Anxiety disorder, unspecified: Secondary | ICD-10-CM | POA: Diagnosis present

## 2022-09-24 DIAGNOSIS — C801 Malignant (primary) neoplasm, unspecified: Secondary | ICD-10-CM | POA: Diagnosis not present

## 2022-09-24 DIAGNOSIS — Z880 Allergy status to penicillin: Secondary | ICD-10-CM | POA: Diagnosis not present

## 2022-09-24 DIAGNOSIS — R059 Cough, unspecified: Secondary | ICD-10-CM | POA: Diagnosis not present

## 2022-09-24 DIAGNOSIS — T368X5A Adverse effect of other systemic antibiotics, initial encounter: Secondary | ICD-10-CM | POA: Diagnosis not present

## 2022-09-24 DIAGNOSIS — C7951 Secondary malignant neoplasm of bone: Secondary | ICD-10-CM | POA: Diagnosis present

## 2022-09-24 DIAGNOSIS — R579 Shock, unspecified: Secondary | ICD-10-CM | POA: Diagnosis not present

## 2022-09-24 DIAGNOSIS — E871 Hypo-osmolality and hyponatremia: Secondary | ICD-10-CM | POA: Diagnosis present

## 2022-09-24 DIAGNOSIS — R0602 Shortness of breath: Secondary | ICD-10-CM | POA: Diagnosis not present

## 2022-09-24 DIAGNOSIS — C3490 Malignant neoplasm of unspecified part of unspecified bronchus or lung: Secondary | ICD-10-CM | POA: Diagnosis not present

## 2022-09-24 DIAGNOSIS — Z6826 Body mass index (BMI) 26.0-26.9, adult: Secondary | ICD-10-CM | POA: Diagnosis not present

## 2022-09-24 DIAGNOSIS — I714 Abdominal aortic aneurysm, without rupture, unspecified: Secondary | ICD-10-CM | POA: Diagnosis not present

## 2022-09-24 DIAGNOSIS — Z823 Family history of stroke: Secondary | ICD-10-CM | POA: Diagnosis not present

## 2022-09-24 DIAGNOSIS — T827XXA Infection and inflammatory reaction due to other cardiac and vascular devices, implants and grafts, initial encounter: Secondary | ICD-10-CM | POA: Diagnosis not present

## 2022-09-24 DIAGNOSIS — J189 Pneumonia, unspecified organism: Secondary | ICD-10-CM | POA: Diagnosis present

## 2022-09-24 DIAGNOSIS — R0789 Other chest pain: Secondary | ICD-10-CM | POA: Diagnosis not present

## 2022-09-24 DIAGNOSIS — J168 Pneumonia due to other specified infectious organisms: Secondary | ICD-10-CM | POA: Diagnosis not present

## 2022-09-24 DIAGNOSIS — Z79899 Other long term (current) drug therapy: Secondary | ICD-10-CM

## 2022-09-24 DIAGNOSIS — Z87891 Personal history of nicotine dependence: Secondary | ICD-10-CM | POA: Diagnosis not present

## 2022-09-24 DIAGNOSIS — E785 Hyperlipidemia, unspecified: Secondary | ICD-10-CM | POA: Diagnosis present

## 2022-09-24 DIAGNOSIS — J811 Chronic pulmonary edema: Secondary | ICD-10-CM | POA: Diagnosis not present

## 2022-09-24 DIAGNOSIS — R079 Chest pain, unspecified: Secondary | ICD-10-CM | POA: Diagnosis not present

## 2022-09-24 DIAGNOSIS — J449 Chronic obstructive pulmonary disease, unspecified: Secondary | ICD-10-CM | POA: Diagnosis not present

## 2022-09-24 DIAGNOSIS — R918 Other nonspecific abnormal finding of lung field: Secondary | ICD-10-CM | POA: Diagnosis not present

## 2022-09-24 HISTORY — DX: Peripheral vascular disease, unspecified: I73.9

## 2022-09-24 HISTORY — DX: Malignant neoplasm of unspecified part of left bronchus or lung: C34.92

## 2022-09-24 LAB — CBC
HCT: 37.1 % — ABNORMAL LOW (ref 39.0–52.0)
Hemoglobin: 12.9 g/dL — ABNORMAL LOW (ref 13.0–17.0)
MCH: 30.8 pg (ref 26.0–34.0)
MCHC: 34.8 g/dL (ref 30.0–36.0)
MCV: 88.5 fL (ref 80.0–100.0)
Platelets: 213 10*3/uL (ref 150–400)
RBC: 4.19 MIL/uL — ABNORMAL LOW (ref 4.22–5.81)
RDW: 13.9 % (ref 11.5–15.5)
WBC: 10.8 10*3/uL — ABNORMAL HIGH (ref 4.0–10.5)
nRBC: 0 % (ref 0.0–0.2)

## 2022-09-24 LAB — BASIC METABOLIC PANEL
Anion gap: 14 (ref 5–15)
BUN: 38 mg/dL — ABNORMAL HIGH (ref 8–23)
CO2: 22 mmol/L (ref 22–32)
Calcium: 8.7 mg/dL — ABNORMAL LOW (ref 8.9–10.3)
Chloride: 95 mmol/L — ABNORMAL LOW (ref 98–111)
Creatinine, Ser: 1.36 mg/dL — ABNORMAL HIGH (ref 0.61–1.24)
GFR, Estimated: 50 mL/min — ABNORMAL LOW (ref >60)
Glucose, Bld: 99 mg/dL (ref 70–99)
Potassium: 4 mmol/L (ref 3.5–5.1)
Sodium: 131 mmol/L — ABNORMAL LOW (ref 135–145)

## 2022-09-24 LAB — TROPONIN I (HIGH SENSITIVITY)
Troponin I (High Sensitivity): 13 ng/L (ref <18)
Troponin I (High Sensitivity): 12 ng/L (ref <18)

## 2022-09-24 MED ORDER — MIDODRINE HCL 5 MG PO TABS
10.0000 mg | ORAL_TABLET | ORAL | Status: AC
  Administered 2022-09-24: 10 mg via ORAL
  Filled 2022-09-24: qty 2

## 2022-09-24 MED ORDER — ENOXAPARIN SODIUM 40 MG/0.4ML IJ SOSY: 40.0000 mg | PREFILLED_SYRINGE | Freq: Every day | INTRAMUSCULAR | Status: DC

## 2022-09-24 MED ORDER — SODIUM CHLORIDE 0.9 % IV SOLN: INTRAVENOUS | Status: DC

## 2022-09-24 MED ORDER — ACETAMINOPHEN 325 MG PO TABS
650.0000 mg | ORAL_TABLET | Freq: Four times a day (QID) | ORAL | Status: DC | PRN
  Administered 2022-09-25 – 2022-09-27 (×2): 650 mg via ORAL
  Filled 2022-09-24 (×2): qty 2

## 2022-09-24 MED ORDER — IOHEXOL 350 MG/ML SOLN
75.0000 mL | Freq: Once | INTRAVENOUS | Status: AC | PRN
  Administered 2022-09-24: 75 mL via INTRAVENOUS

## 2022-09-24 MED ORDER — PROCHLORPERAZINE EDISYLATE 10 MG/2ML IJ SOLN: 5.0000 mg | Freq: Four times a day (QID) | INTRAMUSCULAR | Status: DC | PRN

## 2022-09-24 MED ORDER — SODIUM CHLORIDE 0.9 % IV BOLUS
500.0000 mL | Freq: Once | INTRAVENOUS | Status: AC
  Administered 2022-09-24: 500 mL via INTRAVENOUS

## 2022-09-24 MED ORDER — MELATONIN 5 MG PO TABS
5.0000 mg | ORAL_TABLET | Freq: Every evening | ORAL | Status: DC | PRN
  Administered 2022-09-27: 5 mg via ORAL
  Filled 2022-09-24 (×2): qty 1

## 2022-09-24 MED ORDER — POLYETHYLENE GLYCOL 3350 17 G PO PACK: 17.0000 g | PACK | Freq: Every day | ORAL | Status: DC | PRN

## 2022-09-24 MED ORDER — ADULT MULTIVITAMIN W/MINERALS CH
1.0000 | ORAL_TABLET | Freq: Every day | ORAL | Status: DC
  Administered 2022-09-25 – 2022-09-28 (×4): 1 via ORAL
  Filled 2022-09-24 (×4): qty 1

## 2022-09-24 NOTE — Telephone Encounter (Signed)
Recommend he be evaluated at [facility] or ED since we do not have any availability. Thanks.

## 2022-09-24 NOTE — Telephone Encounter (Signed)
Spoke with daughter. She advises patient is being seen in UC for evaluation. Closing encounter. nfn

## 2022-09-24 NOTE — Telephone Encounter (Signed)
Spoke with patient. He complains of SOB, HR is in the 90s-with no activity, productive cough with white phlegm, sweating, thinks he's had a fever, but no way to check, also daughter states he's very pale Symptoms started about a week ago, but symptoms are getting worse over the last few days Patient has taken Robitussin for the coughing-helped just a little, but cough seems a little worse today  Pharmacy [facility] in Home can you please advise?

## 2022-09-24 NOTE — H&P (Addendum)
History and Physical  Edward Campos KZS:[id number] DOB: 04/13/1932 DOA: 09/24/2022  Referring physician: Valrie Hart, PA-EDP   PCP: Patient, No Pcp Per  Outpatient Specialists: Oncology, pulmonary, vascular surgery. Patient coming from: Home.  Chief Complaint: Worsening cough  HPI: Edward Campos is a 89 y.o. male with medical history significant for former smoker, recently diagnosed (June 2024 )left lower lung mass/small cell lung cancer, AAA, peripheral vascular disease status post femoral-femoral bypass graft complicated by infection status post I&D with wound VAC placement to R groin, who presents from home due to progressively worsening productive cough.  Associated with exertional dyspnea, generalized fatigue and subjective fevers at home.  Did not check his temperature but felt feverish.  In the ED, hypotensive, tachypneic, and hypoxic with ambulation, O2 saturation dropping into the 80s with ambulation.  CT angio chest ruled out pulmonary embolism however showed findings concerning for obstructive pneumonia.  IV Zosyn ordered, patient has an allergy to penicillin.  IV Zosyn was switched to IV cefepime and IV Flagyl.  Due to worsening hypotension IV linezolid was started.  Despite 2 L IV fluid boluses and 20 mg of p.o. vasopressor midodrine, MAP remained in the 50s.  IV vasopressor, Levophed, initiated.    PCCM consulted to assist with the management, due to concern for developing septic shock secondary to postobstructive pneumonia.  Lactic acid, procalcitonin, repeat CBC, and BNP are pending.  The patient was initially admitted by Edward Campos, hospitalist service, to stepdown unit as inpatient status.   ED Course: BP 71/43, pulse 72, respiration rate 22.  Lab studies notable for WBC 10.8.  Hemoglobin 12.9.  Serum serum 131.  BUN 38, creatinine 1.26.  GFR 50.  Troponin 13, repeat 12.  Review of Systems: Review of systems as noted in the HPI. All other systems reviewed and are  negative.   Past Medical History:  Diagnosis Date   AAA (abdominal aortic aneurysm) (HCC)    Anxiety    Arthritis    BPH (benign prostatic hyperplasia)    Dizziness    Hyperlipidemia    Past Surgical History:  Procedure Laterality Date   ABDOMINAL AORTOGRAM W/LOWER EXTREMITY N/A 05/06/2019   Procedure: ABDOMINAL AORTOGRAM W/LOWER EXTREMITY;  Surgeon: Cephus Shelling, MD;  Location: [facility];  Service: Cardiovascular;  Laterality: N/A;   ABDOMINAL WOUND DEHISCENCE N/A 08/22/2022   Procedure: WASHOUT ABDOMEN WOUND AND VAC CHANGE;  Surgeon: Cephus Shelling, MD;  Location: [facility] OR;  Service: Vascular;  Laterality: N/A;   APPLICATION OF WOUND VAC  07/06/2019   Procedure: Application Of Wound Vac Left Groin;  Surgeon: Sherren Kerns, MD;  Location: [facility] OR;  Service: Vascular;;   APPLICATION OF WOUND VAC N/A 08/16/2022   Procedure: APPLICATION OF WOUND VAC;  Surgeon: [name], MD;  Location: [facility] OR;  Service: Vascular;  Laterality: N/A;   BRONCHIAL BIOPSY  08/21/2022   Procedure: BRONCHIAL BIOPSIES;  Surgeon: Oretha Milch, MD;  Location: [facility] ENDOSCOPY;  Service: Cardiopulmonary;;   BRONCHIAL BRUSHINGS  08/21/2022   Procedure: BRONCHIAL BRUSHINGS;  Surgeon: Oretha Milch, MD;  Location: [facility] ENDOSCOPY;  Service: Cardiopulmonary;;   BRONCHIAL WASHINGS  08/21/2022   Procedure: BRONCHIAL WASHINGS;  Surgeon: Oretha Milch, MD;  Location: [facility] ENDOSCOPY;  Service: Cardiopulmonary;;   CARDIOVERSION N/A 11/03/2020   Procedure: CARDIOVERSION;  Surgeon: Thurmon Fair, MD;  Location: [facility] ENDOSCOPY;  Service: Cardiovascular;  Laterality: N/A;   ENDARTERECTOMY FEMORAL Bilateral 06/08/2019   Procedure: ENDARTERECTOMY RIGHT COMMON FEMORAL; ENDARTERECTOMY RIGHT EXTERNAL ILIAC; ENDARTERECTOMY  LEFT COMMON FEMORAL; ENDARTERECTOMY LEFT EXTERNAL ILIAC;  Surgeon: Sherren Kerns, MD;  Location: [facility] OR;  Service: Vascular;  Laterality: Bilateral;   FEMORAL-FEMORAL BYPASS GRAFT Bilateral  06/08/2019   Procedure: BYPASS GRAFT FEMORAL-FEMORAL ARTERY;  Surgeon: Sherren Kerns, MD;  Location: [facility] OR;  Service: Vascular;  Laterality: Bilateral;   GROIN DEBRIDEMENT Left 07/06/2019   Procedure: INCISION AND DRAINAGE OF LEFT GROIN;  Surgeon: Sherren Kerns, MD;  Location: [facility] OR;  Service: Vascular;  Laterality: Left;   growth removed from chest     HEMORRHOID SURGERY     HEMOSTASIS CONTROL  08/21/2022   Procedure: HEMOSTASIS CONTROL;  Surgeon: Oretha Milch, MD;  Location: [facility] ENDOSCOPY;  Service: Cardiopulmonary;;   HERNIA REPAIR  12 yrs ago   left inguinal hernia   INCISE AND DRAIN ABCESS Left 07/06/2019   Incision and drainage left groin placement of VAC dressing   INCISION AND DRAINAGE OF WOUND Right 08/16/2022   Procedure: IRRIGATION AND DEBRIDEMENT WOUND;  Surgeon: [name], MD;  Location: [facility] OR;  Service: Vascular;  Laterality: Right;   INGUINAL HERNIA REPAIR  10/16/2010   Procedure: HERNIA REPAIR INGUINAL ADULT;  Surgeon: Dalia Heading;  Location: [facility] ORS;  Service: General;  Laterality: Right;   INSERTION OF ILIAC STENT Right 06/08/2019   Procedure: INSERTION OF RIGHT COMMON ILIAC AND EXTERNAL ILICAC STENT;  Surgeon: Sherren Kerns, MD;  Location: [facility] OR;  Service: Vascular;  Laterality: Right;   PATCH ANGIOPLASTY Right 06/08/2019   Procedure: Patch Angioplasty Right Common Femoral;  Surgeon: Sherren Kerns, MD;  Location: [facility] OR;  Service: Vascular;  Laterality: Right;   VIDEO BRONCHOSCOPY N/A 08/21/2022   Procedure: VIDEO BRONCHOSCOPY WITH FLUORO;  Surgeon: Oretha Milch, MD;  Location: [facility] ENDOSCOPY;  Service: Cardiopulmonary;  Laterality: N/A;    Social History:  reports that he quit smoking about 3 years ago. His smoking use included cigarettes. He started smoking about 73 years ago. He has a 105 pack-year smoking history. He has never used smokeless tobacco. He reports current alcohol use of about 3.0 standard drinks of alcohol per week. He reports that  he does not use drugs.   Allergies  Allergen Reactions   Penicillins Other (See Comments)    UNSPECIFIED REACTION  Did it involve swelling of the face/tongue/throat, SOB, or low BP? Unknown Did it involve sudden or severe rash/hives, skin peeling, or any reaction on the inside of your mouth or nose? Unknown Did you need to seek medical attention at a hospital or doctor's office? Unknown When did it last happen? Between ages 45-13   If all above answers are "NO", may proceed with cephalosporin use.     Family History  Problem Relation Age of Onset   Stroke Mother    Pseudochol deficiency Neg Hx    Malignant hyperthermia Neg Hx    Hypotension Neg Hx    Anesthesia problems Neg Hx       Prior to Admission medications   Medication Sig Start Date End Date Taking? Authorizing Provider  aspirin EC 81 MG tablet Take 1 tablet (81 mg total) by mouth daily. Swallow whole. 06/30/21   Sande Rives, MD  diphenhydramine-acetaminophen (TYLENOL PM) 25-500 MG TABS tablet Take 1 tablet by mouth at bedtime as needed (Sleep).    [provider]  ibuprofen (ADVIL) 200 MG tablet Take 400 mg by mouth every 6 (six) hours as needed. Patient not taking: Reported on 09/19/2022    [provider]  Multiple Vitamins-Minerals (MULTIVITAMIN WITH MINERALS) tablet Take 1 tablet by mouth daily.    [provider]  rosuvastatin (CRESTOR) 20 MG tablet TAKE 1 TABLET(20 MG) BY MOUTH DAILY 07/09/22   [name], MD  Simethicone (GAS RELIEF EXTRA STRENGTH PO) Take 2 tablets by mouth daily as needed (gas).    [provider]    Physical Exam: BP (!) 84/56   Pulse 64   Temp 99.2 F (37.3 C) (Oral)   Resp (!) 24   SpO2 97%   General: 89 y.o. year-old male well developed well nourished in no acute distress.  Alert and oriented x3. Cardiovascular: Regular rate and rhythm with no rubs or gallops.  No thyromegaly or JVD noted.  No lower extremity edema. 2/4 pulses  in all 4 extremities. Respiratory: Diffuse rales bilaterally.  Poor inspiratory effort. Abdomen: Soft nontender nondistended with normal bowel sounds x4 quadrants. Muskuloskeletal: No cyanosis, clubbing or edema noted bilaterally Neuro: CN II-XII intact, strength, sensation, reflexes Skin: No ulcerative lesions noted or rashes Psychiatry: Judgement and insight appear normal. Mood is appropriate for condition and setting          Labs on Admission:  Basic Metabolic Panel: Recent Labs  Lab 09/24/22 1640  NA 131*  K 4.0  CL 95*  CO2 22  GLUCOSE 99  BUN 38*  CREATININE 1.36*  CALCIUM 8.7*   Liver Function Tests: No results for input(s): "AST", "ALT", "ALKPHOS", "BILITOT", "PROT", "ALBUMIN" in the last 168 hours. No results for input(s): "LIPASE", "AMYLASE" in the last 168 hours. No results for input(s): "AMMONIA" in the last 168 hours. CBC: Recent Labs  Lab 09/24/22 1640  WBC 10.8*  HGB 12.9*  HCT 37.1*  MCV 88.5  PLT 213   Cardiac Enzymes: No results for input(s): "CKTOTAL", "CKMB", "CKMBINDEX", "TROPONINI" in the last 168 hours.  BNP (last 3 results) No results for input(s): "BNP" in the last 8760 hours.  ProBNP (last 3 results) No results for input(s): "PROBNP" in the last 8760 hours.  CBG: No results for input(s): "GLUCAP" in the last 168 hours.  Radiological Exams on Admission: CT Angio Chest PE W/Cm &/Or Wo Cm  Result Date: 09/24/2022 CLINICAL DATA:  Shortness of breath chest pain EXAM: CT ANGIOGRAPHY CHEST WITH CONTRAST TECHNIQUE: Multidetector CT imaging of the chest was performed using the standard protocol during bolus administration of intravenous contrast. Multiplanar CT image reconstructions and MIPs were obtained to evaluate the vascular anatomy. RADIATION DOSE REDUCTION: This exam was performed according to the departmental dose-optimization program which includes automated exposure control, adjustment of the mA and/or kV according to patient size  and/or use of iterative reconstruction technique. CONTRAST:  75mL OMNIPAQUE IOHEXOL 350 MG/ML SOLN COMPARISON:  Chest x-ray 09/24/2022, PET CT 09/06/2022, CT chest 08/19/2022 FINDINGS: Cardiovascular: Satisfactory opacification of the pulmonary arteries to the segmental level. No evidence of pulmonary embolism. Advanced aortic atherosclerosis. Mild aneurysmal dilatation up to 4.1 cm. Coronary vascular calcifications. No pericardial effusion. Mediastinum/Nodes: Midline trachea. No thyroid mass. Multiple small AP window lymph nodes. Esophagus within normal limits. Left hilar node measuring 3 cm compared with 2 cm previously, now contiguous with the left lower lobe lung mass. Left hilar node slightly superior measures 19 mm compared with 13 mm previously. A small right hilar lymph node measures 12 mm. Lungs/Pleura: Emphysema. Left lower lobe irregular solid mass exhibits slightly different morphology compared to the prior CT, it measures about 6 0.1 by 4.8 cm compared with 6 x 4.7 cm. Increased  irregular airspace disease in the left lower lobe compared to prior. No significant pleural effusion. Upper Abdomen: No acute abnormality. Musculoskeletal: No acute osseous abnormality. Faintly visible lucent lesions within the thoracic vertebra, corresponding to history of metastatic disease on PET CT but better visualized on that exam Review of the MIP images confirms the above findings. IMPRESSION: 1. Negative for acute pulmonary embolus. 2. Left lower lobe lung mass consistent with known malignancy. Increased irregular airspace disease in the left lower lobe compared to prior suggesting postobstructive pneumonia. 3. Slight interval increase in size of left hilar lymph nodes, consistent with metastatic disease. 4. Faintly visible lucent lesions within the thoracic vertebra, corresponding to history of metastatic disease. Skeletal metastatic disease 2 is better 3 seen on the prior PET CT. Aortic Atherosclerosis (ICD10-I70.0)  and Emphysema (ICD10-J43.9). Electronically Signed   By: Edward Campos M.D.   On: 09/24/2022 21:02   DG Chest 2 View  Result Date: 09/24/2022 CLINICAL DATA:  Shortness of breath, cough, and chest pain. EXAM: CHEST - 2 VIEW COMPARISON:  CT 08/19/2022 FINDINGS: Heart size and pulmonary vascularity are normal for technique. Developing infiltration in the left lung base. This suggest developing pneumonia or postobstructive change. The mass lesion seen in the left infrahilar region on prior CT is not well demonstrated radiographically. No pleural effusions. No pneumothorax. Mediastinal contours appear intact. IMPRESSION: Increasing infiltration in the left lung base likely representing developing pneumonia or postobstructive change. Known left infrahilar mass is not well demonstrated radiographically. Electronically Signed   By: Edward Campos M.D.   On: 09/24/2022 17:08    EKG: I independently viewed the EKG done and my findings are as followed: Normal sinus rhythm rate of 92.  Nonspecific ST-T changes QTc 425.  Assessment/Plan Present on Admission:  Pneumonia  Principal Problem:   Pneumonia  Developing septic shock secondary to postobstructive pneumonia in the setting of left lower lobe mass/small cell lung cancer, POA Endorses subjective fevers at home in immunocompromised state, tachypnea, lactic acid is pending Findings on CT scan concerning for postobstructive pneumonia Started broad-spectrum IV antibiotics cefepime, IV Flagyl, IV Linezolid Follow peripheral blood cultures x 2 and sputum culture. Started on IV vasopressor, Levophed. Continue IV fluid hydration, maintain MAP greater than 65. Appreciate PCCM assistance.  Newly diagnosed small cell lung cancer Former smoker Treatment for cancer on hold until right femoral-femoral bypass graft infection has healed completely Follow-up with oncology outpatient to explore these options: Chemotherapy/radiation/immunotherapy  Right  femoral-femoral bypass graft infection, status post wound VAC placement Vascular surgery and wound care specialist consulted to assist with the management of his wound VAC  Generalized fatigue in the setting of malignancy PT OT assessment Fall precautions Encourage oral protein calorie intake  Hyponatremia, concern for SIADH in the setting of small cell lung cancer Serum sodium 131 on presentation Received IV fluid hydration due to hypotension Closely monitor serum sodium  Moderate protein calorie malnutrition, ruled out per dietitian Serum albumin 2.5 BMI 25 Dietitian consulted, encourage oral intake as tolerated.   Time: 75 minutes.   DVT prophylaxis: Subcu heparin 3 times daily  Code Status: Full code  Family Communication: Updated his daughter at bedside.  Disposition Plan: Initially admitted to progressive care unit.  Consults called: Critical care medicine.  Admission status: Inpatient status.   Status is: Inpatient The patient requires at least 2 midnights for further evaluation and treatment of present condition.   Edward Drop MD [facility] Pager [phone]  If 7PM-7AM, please contact night-coverage [email or web address] Password  [email or web address]  09/24/2022, 11:02 PM

## 2022-09-24 NOTE — ED Triage Notes (Signed)
Patient arrives with multiple complaints from home. Pt went to UC today for same and was advised to come here. Patient had two episodes lasting 5 minutes each of central chest pain. Home health nurse comes to his house to help him change the wound vac three times per week and his blood pressure has been lower than normal when she checks it. Family reports 80/40 today at [facility]. Recent vascular surgery on RLE and has wound vac to same. Also has had several days of shortness of breath productive cough with white sputum.

## 2022-09-24 NOTE — ED Provider Notes (Signed)
[name] Provider Note   CSN: [id number] Arrival date & time: 09/24/22  1616     History  Chief Complaint  Patient presents with   Chest Pain   Cough   Hypotension    Edward Campos is a 89 y.o. male with PMHx AAA, HLD, metastatic lung cancer who presents to ED concerned for chest pain x4 days. Pain is intermittent and not associated with rest or exertion and does not radiate. Also complaining of productive cough x6 weeks that has been worsening. Also endorsing DOE and fever of 88F today. Took ASA when chest started hurting.   Denies hx DVT/PE, calf pain. Denies nausea, vomiting, hematochezia   Chest Pain Associated symptoms: cough   Cough Associated symptoms: chest pain        Home Medications Prior to Admission medications   Medication Sig Start Date End Date Taking? Authorizing Provider  aspirin EC 81 MG tablet Take 1 tablet (81 mg total) by mouth daily. Swallow whole. 06/30/21   Sande Rives, MD  diphenhydramine-acetaminophen (TYLENOL PM) 25-500 MG TABS tablet Take 1 tablet by mouth at bedtime as needed (Sleep).    [provider]  ibuprofen (ADVIL) 200 MG tablet Take 400 mg by mouth every 6 (six) hours as needed. Patient not taking: Reported on 09/19/2022    [provider]  Multiple Vitamins-Minerals (MULTIVITAMIN WITH MINERALS) tablet Take 1 tablet by mouth daily.    [provider]  rosuvastatin (CRESTOR) 20 MG tablet TAKE 1 TABLET(20 MG) BY MOUTH DAILY 07/09/22   [name], MD  Simethicone (GAS RELIEF EXTRA STRENGTH PO) Take 2 tablets by mouth daily as needed (gas).    [provider]      Allergies    Penicillins    Review of Systems   Review of Systems  Respiratory:  Positive for cough.   Cardiovascular:  Positive for chest pain.    Physical Exam Updated Vital Signs BP (!) 84/52   Pulse 71   Temp 98.2 F (36.8 C) (Oral)   Resp (!) 24   SpO2  100%  Physical Exam Vitals and nursing note reviewed.  Constitutional:      General: He is not in acute distress.    Appearance: He is not ill-appearing, toxic-appearing or diaphoretic.  HENT:     Head: Normocephalic and atraumatic.     Mouth/Throat:     Mouth: Mucous membranes are moist.     Pharynx: No oropharyngeal exudate or posterior oropharyngeal erythema.  Eyes:     General: No scleral icterus.       Right eye: No discharge.        Left eye: No discharge.     Conjunctiva/sclera: Conjunctivae normal.  Cardiovascular:     Rate and Rhythm: Normal rate and regular rhythm.     Pulses: Normal pulses.          Radial pulses are 2+ on the right side and 2+ on the left side.       Dorsalis pedis pulses are 2+ on the right side and 2+ on the left side.     Heart sounds: Normal heart sounds. No murmur heard. Pulmonary:     Effort: Pulmonary effort is normal. No respiratory distress.     Breath sounds: No wheezing, rhonchi or rales.  Abdominal:     General: Bowel sounds are normal.     Palpations: Abdomen is soft. There is no mass.     Tenderness:  There is no abdominal tenderness.  Musculoskeletal:     Right lower leg: No edema.     Left lower leg: No edema.  Skin:    General: Skin is warm and dry.     Findings: No rash.  Neurological:     General: No focal deficit present.     Mental Status: He is alert. Mental status is at baseline.  Psychiatric:        Mood and Affect: Mood normal.        Behavior: Behavior normal.     ED Results / Procedures / Treatments   Labs (all labs ordered are listed, but only abnormal results are displayed) Labs Reviewed  BASIC METABOLIC PANEL - Abnormal; Notable for the following components:      Result Value   Sodium 131 (*)    Chloride 95 (*)    BUN 38 (*)    Creatinine, Ser 1.36 (*)    Calcium 8.7 (*)    GFR, Estimated 50 (*)    All other components within normal limits  CBC - Abnormal; Notable for the following components:   WBC  10.8 (*)    RBC 4.19 (*)    Hemoglobin 12.9 (*)    HCT 37.1 (*)    All other components within normal limits  TROPONIN I (HIGH SENSITIVITY)  TROPONIN I (HIGH SENSITIVITY)    EKG EKG Interpretation Date/Time:  Monday September 24 2022 16:04:09 EDT Ventricular Rate:  92 PR Interval:  196 QRS Duration:  88 QT Interval:  344 QTC Calculation: 425 R Axis:   70  Text Interpretation: Normal sinus rhythm Normal ECG When compared with ECG of 03-Nov-2020 09:31, PREVIOUS ECG IS PRESENT Confirmed by Alvester Chou [phone]) on 09/24/2022 7:10:17 PM  Radiology CT Angio Chest PE W/Cm &/Or Wo Cm  Result Date: 09/24/2022 CLINICAL DATA:  Shortness of breath chest pain EXAM: CT ANGIOGRAPHY CHEST WITH CONTRAST TECHNIQUE: Multidetector CT imaging of the chest was performed using the standard protocol during bolus administration of intravenous contrast. Multiplanar CT image reconstructions and MIPs were obtained to evaluate the vascular anatomy. RADIATION DOSE REDUCTION: This exam was performed according to the departmental dose-optimization program which includes automated exposure control, adjustment of the mA and/or kV according to patient size and/or use of iterative reconstruction technique. CONTRAST:  75mL OMNIPAQUE IOHEXOL 350 MG/ML SOLN COMPARISON:  Chest x-ray 09/24/2022, PET CT 09/06/2022, CT chest 08/19/2022 FINDINGS: Cardiovascular: Satisfactory opacification of the pulmonary arteries to the segmental level. No evidence of pulmonary embolism. Advanced aortic atherosclerosis. Mild aneurysmal dilatation up to 4.1 cm. Coronary vascular calcifications. No pericardial effusion. Mediastinum/Nodes: Midline trachea. No thyroid mass. Multiple small AP window lymph nodes. Esophagus within normal limits. Left hilar node measuring 3 cm compared with 2 cm previously, now contiguous with the left lower lobe lung mass. Left hilar node slightly superior measures 19 mm compared with 13 mm previously. A small right hilar lymph  node measures 12 mm. Lungs/Pleura: Emphysema. Left lower lobe irregular solid mass exhibits slightly different morphology compared to the prior CT, it measures about 6 0.1 by 4.8 cm compared with 6 x 4.7 cm. Increased irregular airspace disease in the left lower lobe compared to prior. No significant pleural effusion. Upper Abdomen: No acute abnormality. Musculoskeletal: No acute osseous abnormality. Faintly visible lucent lesions within the thoracic vertebra, corresponding to history of metastatic disease on PET CT but better visualized on that exam Review of the MIP images confirms the above findings. IMPRESSION: 1. Negative for acute pulmonary  embolus. 2. Left lower lobe lung mass consistent with known malignancy. Increased irregular airspace disease in the left lower lobe compared to prior suggesting postobstructive pneumonia. 3. Slight interval increase in size of left hilar lymph nodes, consistent with metastatic disease. 4. Faintly visible lucent lesions within the thoracic vertebra, corresponding to history of metastatic disease. Skeletal metastatic disease 2 is better 3 seen on the prior PET CT. Aortic Atherosclerosis (ICD10-I70.0) and Emphysema (ICD10-J43.9). Electronically Signed   By: Jasmine Pang M.D.   On: 09/24/2022 21:02   DG Chest 2 View  Result Date: 09/24/2022 CLINICAL DATA:  Shortness of breath, cough, and chest pain. EXAM: CHEST - 2 VIEW COMPARISON:  CT 08/19/2022 FINDINGS: Heart size and pulmonary vascularity are normal for technique. Developing infiltration in the left lung base. This suggest developing pneumonia or postobstructive change. The mass lesion seen in the left infrahilar region on prior CT is not well demonstrated radiographically. No pleural effusions. No pneumothorax. Mediastinal contours appear intact. IMPRESSION: Increasing infiltration in the left lung base likely representing developing pneumonia or postobstructive change. Known left infrahilar mass is not well  demonstrated radiographically. Electronically Signed   By: Burman Nieves M.D.   On: 09/24/2022 17:08    Procedures Procedures    Medications Ordered in ED Medications  sodium chloride 0.9 % bolus 500 mL (has no administration in time range)  iohexol (OMNIPAQUE) 350 MG/ML injection 75 mL (75 mLs Intravenous Contrast Given 09/24/22 2036)    ED Course/ Medical Decision Making/ A&P                             Medical Decision Making Amount and/or Complexity of Data Reviewed Labs: ordered. Radiology: ordered.   This patient presents to the ED for concern of chest pain, this involves an extensive number of treatment options, and is a complaint that carries with it a high risk of complications and morbidity.  The differential diagnosis includes acute coronary syndrome, congestive heart failure, pneumonia, pulmonary embolism, tension pneumothorax, esophageal rupture, aortic dissection, cardiac tamponade, musculoskeletal   Co morbidities that complicate the patient evaluation  AAA, HLD, metastatic lung cancer    Lab Tests:  I Ordered, and personally interpreted labs.  The pertinent results include:  - Troponin: within normal limits - BMP: mild hypokalemia - CBC: mild leukocytosis; mild anemia   Imaging Studies ordered:  I ordered imaging studies including  Chest xray:  assess for process contributing to patient's symptoms. CT: assess for pulmonary embolism I independently visualized and interpreted imaging  I agree with the radiologist interpretation   Cardiac Monitoring: / EKG:  The patient was maintained on a cardiac monitor.  I personally viewed and interpreted the cardiac monitored which showed an underlying rhythm of: No acute ST changes   Consultations Obtained:  I requested consultation with Dr. Margo Aye,  and discussed lab and imaging findings as well as pertinent plan - they agree to admit patient   Problem List / ED Course / Critical interventions / Medication  management  Admitting patient for PNA and Hypoxia with ambulation Patient presented for chest pain x4 days and productive cough x6 weeks. Hospitalized last month for infection of vascular bypass graft. Endorses being on 3 different ABX but cannot remember any of the names. Stopped ABX around 10 days ago. Patient O2 dropped to 86% with ambulation. Patient became hypotensive in ED room - gave IV fluid bolus. Patient also Tachypneic. No tachycardia. Tropinin 13 and 12. EKG  without acute ST changes. BMP with mild hyponatremia. BUN/Cr elevated (38/1.36). Leukocytosis (10.8). mild anemia (12.9). CT showing lower left lobe PNA. Dr. Margo Aye admitting provider. I have reviewed the patients home medicines and have made adjustments as needed    Ddx:  These are considered less likely due to history of present illness and physical exam findings.  Acute coronary syndrome: EKG and troponins within normal limits  Congestive heart failure: patient denies orthopnea, cough, and leg edema Pulmonary embolism: CT without concern Pneumothorax: chest xray reassuring Esophageal rupture: patient denies vomiting, heavy drinking, and hx of GERD Aortic dissection: vital signs are stable, no variation in pulse pressure Cardiac tamponade: absence of hypotension, JVD, and muffled heart sounds   Social Determinants of Health:  none           Final Clinical Impression(s) / ED Diagnoses Final diagnoses:  Pneumonia of left lower lobe due to infectious organism    Rx / DC Orders ED Discharge Orders     None         Margarita Rana 09/24/22 2217    Terald Sleeper, MD 09/25/22 1121

## 2022-09-24 NOTE — H&P (Incomplete)
History and Physical  Rashied Corallo ZOX:[id number] DOB: 26-Sep-1932 DOA: 09/24/2022  Referring physician: ***  PCP: Patient, No Pcp Per  Outpatient Specialists: *** Patient coming from: *** & is able to ambulate ***  Chief Complaint: ***   HPI: Ricard Faulkner is a 89 y.o. male with medical history significant for *** (For level 3, the HPI must include 4+ descriptors: Location, Quality, Severity, Duration, Timing, Context, modifying factors, associated signs/symptoms and/or status of 3+ chronic problems.)   ED Course: ***  Review of Systems: Review of systems as noted in the HPI. All other systems reviewed and are negative.   Past Medical History:  Diagnosis Date   AAA (abdominal aortic aneurysm) (HCC)    Anxiety    Arthritis    BPH (benign prostatic hyperplasia)    Dizziness    Hyperlipidemia    Past Surgical History:  Procedure Laterality Date   ABDOMINAL AORTOGRAM W/LOWER EXTREMITY N/A 05/06/2019   Procedure: ABDOMINAL AORTOGRAM W/LOWER EXTREMITY;  Surgeon: Cephus Shelling, MD;  Location: [facility] INVASIVE CV LAB;  Service: Cardiovascular;  Laterality: N/A;   ABDOMINAL WOUND DEHISCENCE N/A 08/22/2022   Procedure: WASHOUT ABDOMEN WOUND AND VAC CHANGE;  Surgeon: Cephus Shelling, MD;  Location: [facility] OR;  Service: Vascular;  Laterality: N/A;   APPLICATION OF WOUND VAC  07/06/2019   Procedure: Application Of Wound Vac Left Groin;  Surgeon: Sherren Kerns, MD;  Location: [facility] OR;  Service: Vascular;;   APPLICATION OF WOUND VAC N/A 08/16/2022   Procedure: APPLICATION OF WOUND VAC;  Surgeon: [name], MD;  Location: [facility] OR;  Service: Vascular;  Laterality: N/A;   BRONCHIAL BIOPSY  08/21/2022   Procedure: BRONCHIAL BIOPSIES;  Surgeon: Oretha Milch, MD;  Location: [facility] ENDOSCOPY;  Service: Cardiopulmonary;;   BRONCHIAL BRUSHINGS  08/21/2022   Procedure: BRONCHIAL BRUSHINGS;  Surgeon: Oretha Milch, MD;  Location: [facility] ENDOSCOPY;  Service: Cardiopulmonary;;   BRONCHIAL  WASHINGS  08/21/2022   Procedure: BRONCHIAL WASHINGS;  Surgeon: Oretha Milch, MD;  Location: [facility] ENDOSCOPY;  Service: Cardiopulmonary;;   CARDIOVERSION N/A 11/03/2020   Procedure: CARDIOVERSION;  Surgeon: Thurmon Fair, MD;  Location: [facility] ENDOSCOPY;  Service: Cardiovascular;  Laterality: N/A;   ENDARTERECTOMY FEMORAL Bilateral 06/08/2019   Procedure: ENDARTERECTOMY RIGHT COMMON FEMORAL; ENDARTERECTOMY RIGHT EXTERNAL ILIAC; ENDARTERECTOMY LEFT COMMON FEMORAL; ENDARTERECTOMY LEFT EXTERNAL ILIAC;  Surgeon: Sherren Kerns, MD;  Location: [facility] OR;  Service: Vascular;  Laterality: Bilateral;   FEMORAL-FEMORAL BYPASS GRAFT Bilateral 06/08/2019   Procedure: BYPASS GRAFT FEMORAL-FEMORAL ARTERY;  Surgeon: Sherren Kerns, MD;  Location: [facility] OR;  Service: Vascular;  Laterality: Bilateral;   GROIN DEBRIDEMENT Left 07/06/2019   Procedure: INCISION AND DRAINAGE OF LEFT GROIN;  Surgeon: Sherren Kerns, MD;  Location: [facility] OR;  Service: Vascular;  Laterality: Left;   growth removed from chest     HEMORRHOID SURGERY     HEMOSTASIS CONTROL  08/21/2022   Procedure: HEMOSTASIS CONTROL;  Surgeon: Oretha Milch, MD;  Location: [facility] ENDOSCOPY;  Service: Cardiopulmonary;;   HERNIA REPAIR  12 yrs ago   left inguinal hernia   INCISE AND DRAIN ABCESS Left 07/06/2019   Incision and drainage left groin placement of VAC dressing   INCISION AND DRAINAGE OF WOUND Right 08/16/2022   Procedure: IRRIGATION AND DEBRIDEMENT WOUND;  Surgeon: [name], MD;  Location: [facility] OR;  Service: Vascular;  Laterality: Right;   INGUINAL HERNIA REPAIR  10/16/2010   Procedure: HERNIA REPAIR INGUINAL ADULT;  Surgeon: Dalia Heading;  Location:  [facility] ORS;  Service: General;  Laterality: Right;   INSERTION OF ILIAC STENT Right 06/08/2019   Procedure: INSERTION OF RIGHT COMMON ILIAC AND EXTERNAL ILICAC STENT;  Surgeon: Sherren Kerns, MD;  Location: [facility] OR;  Service: Vascular;  Laterality: Right;   PATCH ANGIOPLASTY Right 06/08/2019    Procedure: Patch Angioplasty Right Common Femoral;  Surgeon: Sherren Kerns, MD;  Location: [facility] OR;  Service: Vascular;  Laterality: Right;   VIDEO BRONCHOSCOPY N/A 08/21/2022   Procedure: VIDEO BRONCHOSCOPY WITH FLUORO;  Surgeon: Oretha Milch, MD;  Location: [facility] ENDOSCOPY;  Service: Cardiopulmonary;  Laterality: N/A;    Social History:  reports that he quit smoking about 3 years ago. His smoking use included cigarettes. He started smoking about 73 years ago. He has a 105 pack-year smoking history. He has never used smokeless tobacco. He reports current alcohol use of about 3.0 standard drinks of alcohol per week. He reports that he does not use drugs.   Allergies  Allergen Reactions   Penicillins Other (See Comments)    UNSPECIFIED REACTION  Did it involve swelling of the face/tongue/throat, SOB, or low BP? Unknown Did it involve sudden or severe rash/hives, skin peeling, or any reaction on the inside of your mouth or nose? Unknown Did you need to seek medical attention at a hospital or doctor's office? Unknown When did it last happen? Between ages [age]   If all above answers are "NO", may proceed with cephalosporin use.     Family History  Problem Relation Age of Onset   Stroke Mother    Pseudochol deficiency Neg Hx    Malignant hyperthermia Neg Hx    Hypotension Neg Hx    Anesthesia problems Neg Hx     ***  Prior to Admission medications   Medication Sig Start Date End Date Taking? Authorizing Provider  aspirin EC 81 MG tablet Take 1 tablet (81 mg total) by mouth daily. Swallow whole. 06/30/21   Sande Rives, MD  diphenhydramine-acetaminophen (TYLENOL PM) 25-500 MG TABS tablet Take 1 tablet by mouth at bedtime as needed (Sleep).    [provider]  ibuprofen (ADVIL) 200 MG tablet Take 400 mg by mouth every 6 (six) hours as needed. Patient not taking: Reported on 09/19/2022    [provider]  Multiple Vitamins-Minerals (MULTIVITAMIN WITH MINERALS)  tablet Take 1 tablet by mouth daily.    [provider]  rosuvastatin (CRESTOR) 20 MG tablet TAKE 1 TABLET(20 MG) BY MOUTH DAILY 07/09/22   [name], MD  Simethicone (GAS RELIEF EXTRA STRENGTH PO) Take 2 tablets by mouth daily as needed (gas).    [provider]    Physical Exam: BP (!) 86/58   Pulse 82   Temp 99.2 F (37.3 C) (Oral)   Resp (!) 24   SpO2 100%   General: 89 y.o. year-old male well developed well nourished in no acute distress.  Alert and oriented x3. Cardiovascular: Regular rate and rhythm with no rubs or gallops.  No thyromegaly or JVD noted.  No lower extremity edema. 2/4 pulses in all 4 extremities. Respiratory: Clear to auscultation with no wheezes or rales. Good inspiratory effort. Abdomen: Soft nontender nondistended with normal bowel sounds x4 quadrants. Muskuloskeletal: No cyanosis, clubbing or edema noted bilaterally Neuro: CN II-XII intact, strength, sensation, reflexes Skin: No ulcerative lesions noted or rashes Psychiatry: Judgement and insight appear normal. Mood is appropriate for condition and setting          Labs on Admission:  Basic Metabolic Panel: Recent Labs  Lab 09/24/22 1640  NA 131*  K 4.0  CL 95*  CO2 22  GLUCOSE 99  BUN 38*  CREATININE 1.36*  CALCIUM 8.7*   Liver Function Tests: No results for input(s): "AST", "ALT", "ALKPHOS", "BILITOT", "PROT", "ALBUMIN" in the last 168 hours. No results for input(s): "LIPASE", "AMYLASE" in the last 168 hours. No results for input(s): "AMMONIA" in the last 168 hours. CBC: Recent Labs  Lab 09/24/22 1640  WBC 10.8*  HGB 12.9*  HCT 37.1*  MCV 88.5  PLT 213   Cardiac Enzymes: No results for input(s): "CKTOTAL", "CKMB", "CKMBINDEX", "TROPONINI" in the last 168 hours.  BNP (last 3 results) No results for input(s): "BNP" in the last 8760 hours.  ProBNP (last 3 results) No results for input(s): "PROBNP" in the last 8760 hours.  CBG: No results for  input(s): "GLUCAP" in the last 168 hours.  Radiological Exams on Admission: CT Angio Chest PE W/Cm &/Or Wo Cm  Result Date: 09/24/2022 CLINICAL DATA:  Shortness of breath chest pain EXAM: CT ANGIOGRAPHY CHEST WITH CONTRAST TECHNIQUE: Multidetector CT imaging of the chest was performed using the standard protocol during bolus administration of intravenous contrast. Multiplanar CT image reconstructions and MIPs were obtained to evaluate the vascular anatomy. RADIATION DOSE REDUCTION: This exam was performed according to the departmental dose-optimization program which includes automated exposure control, adjustment of the mA and/or kV according to patient size and/or use of iterative reconstruction technique. CONTRAST:  75mL OMNIPAQUE IOHEXOL 350 MG/ML SOLN COMPARISON:  Chest x-ray 09/24/2022, PET CT 09/06/2022, CT chest 08/19/2022 FINDINGS: Cardiovascular: Satisfactory opacification of the pulmonary arteries to the segmental level. No evidence of pulmonary embolism. Advanced aortic atherosclerosis. Mild aneurysmal dilatation up to 4.1 cm. Coronary vascular calcifications. No pericardial effusion. Mediastinum/Nodes: Midline trachea. No thyroid mass. Multiple small AP window lymph nodes. Esophagus within normal limits. Left hilar node measuring 3 cm compared with 2 cm previously, now contiguous with the left lower lobe lung mass. Left hilar node slightly superior measures 19 mm compared with 13 mm previously. A small right hilar lymph node measures 12 mm. Lungs/Pleura: Emphysema. Left lower lobe irregular solid mass exhibits slightly different morphology compared to the prior CT, it measures about 6 0.1 by 4.8 cm compared with 6 x 4.7 cm. Increased irregular airspace disease in the left lower lobe compared to prior. No significant pleural effusion. Upper Abdomen: No acute abnormality. Musculoskeletal: No acute osseous abnormality. Faintly visible lucent lesions within the thoracic vertebra, corresponding to  history of metastatic disease on PET CT but better visualized on that exam Review of the MIP images confirms the above findings. IMPRESSION: 1. Negative for acute pulmonary embolus. 2. Left lower lobe lung mass consistent with known malignancy. Increased irregular airspace disease in the left lower lobe compared to prior suggesting postobstructive pneumonia. 3. Slight interval increase in size of left hilar lymph nodes, consistent with metastatic disease. 4. Faintly visible lucent lesions within the thoracic vertebra, corresponding to history of metastatic disease. Skeletal metastatic disease 2 is better 3 seen on the prior PET CT. Aortic Atherosclerosis (ICD10-I70.0) and Emphysema (ICD10-J43.9). Electronically Signed   By: Jasmine Pang M.D.   On: 09/24/2022 21:02   DG Chest 2 View  Result Date: 09/24/2022 CLINICAL DATA:  Shortness of breath, cough, and chest pain. EXAM: CHEST - 2 VIEW COMPARISON:  CT 08/19/2022 FINDINGS: Heart size and pulmonary vascularity are normal for technique. Developing infiltration in the left lung base. This suggest developing pneumonia or postobstructive  change. The mass lesion seen in the left infrahilar region on prior CT is not well demonstrated radiographically. No pleural effusions. No pneumothorax. Mediastinal contours appear intact. IMPRESSION: Increasing infiltration in the left lung base likely representing developing pneumonia or postobstructive change. Known left infrahilar mass is not well demonstrated radiographically. Electronically Signed   By: Burman Nieves M.D.   On: 09/24/2022 17:08    EKG: I independently viewed the EKG done and my findings are as followed: ***   Assessment/Plan Present on Admission:  Pneumonia  Principal Problem:   Pneumonia   DVT prophylaxis: ***   Code Status: ***   Family Communication: ***   Disposition Plan: ***   Consults called: ***   Admission status: ***    Status is: Inpatient {Inpatient:23812}   Darlin Drop MD [facility] Pager [phone]  If 7PM-7AM, please contact night-coverage [email or web address] Password [email or web address]  09/24/2022, 10:50 PM

## 2022-09-24 NOTE — Telephone Encounter (Signed)
Wynona Canes daughter states patient having symptom of shortness of breath. Pharmacy is [facility]. Christine phone number is [phone].

## 2022-09-25 ENCOUNTER — Encounter (HOSPITAL_COMMUNITY): Payer: Self-pay | Admitting: Internal Medicine

## 2022-09-25 DIAGNOSIS — R579 Shock, unspecified: Secondary | ICD-10-CM

## 2022-09-25 DIAGNOSIS — J189 Pneumonia, unspecified organism: Secondary | ICD-10-CM | POA: Diagnosis not present

## 2022-09-25 DIAGNOSIS — A419 Sepsis, unspecified organism: Secondary | ICD-10-CM | POA: Diagnosis not present

## 2022-09-25 DIAGNOSIS — R6521 Severe sepsis with septic shock: Secondary | ICD-10-CM | POA: Diagnosis not present

## 2022-09-25 LAB — CBC WITH DIFFERENTIAL/PLATELET
Abs Immature Granulocytes: 0.05 10*3/uL (ref 0.00–0.07)
Basophils Absolute: 0.1 10*3/uL (ref 0.0–0.1)
Basophils Relative: 1 %
Eosinophils Absolute: 0.9 10*3/uL — ABNORMAL HIGH (ref 0.0–0.5)
Eosinophils Relative: 10 %
HCT: 31.6 % — ABNORMAL LOW (ref 39.0–52.0)
Hemoglobin: 11.1 g/dL — ABNORMAL LOW (ref 13.0–17.0)
Immature Granulocytes: 1 %
Lymphocytes Relative: 3 %
Lymphs Abs: 0.3 10*3/uL — ABNORMAL LOW (ref 0.7–4.0)
MCH: 30.7 pg (ref 26.0–34.0)
MCHC: 35.1 g/dL (ref 30.0–36.0)
MCV: 87.3 fL (ref 80.0–100.0)
Monocytes Absolute: 0.8 10*3/uL (ref 0.1–1.0)
Monocytes Relative: 9 %
Neutro Abs: 6.8 10*3/uL (ref 1.7–7.7)
Neutrophils Relative %: 76 %
Platelets: 177 10*3/uL (ref 150–400)
RBC: 3.62 MIL/uL — ABNORMAL LOW (ref 4.22–5.81)
RDW: 14 % (ref 11.5–15.5)
WBC: 8.9 10*3/uL (ref 4.0–10.5)
nRBC: 0 % (ref 0.0–0.2)

## 2022-09-25 LAB — LACTIC ACID, PLASMA
Lactic Acid, Venous: 1.1 mmol/L (ref 0.5–1.9)
Lactic Acid, Venous: 1.3 mmol/L (ref 0.5–1.9)

## 2022-09-25 LAB — COMPREHENSIVE METABOLIC PANEL
ALT: 29 U/L (ref 0–44)
AST: 26 U/L (ref 15–41)
Albumin: 2.5 g/dL — ABNORMAL LOW (ref 3.5–5.0)
Alkaline Phosphatase: 53 U/L (ref 38–126)
Anion gap: 7 (ref 5–15)
BUN: 31 mg/dL — ABNORMAL HIGH (ref 8–23)
CO2: 19 mmol/L — ABNORMAL LOW (ref 22–32)
Calcium: 7.5 mg/dL — ABNORMAL LOW (ref 8.9–10.3)
Chloride: 103 mmol/L (ref 98–111)
Creatinine, Ser: 1.11 mg/dL (ref 0.61–1.24)
GFR, Estimated: >60 mL/min (ref >60)
Glucose, Bld: 103 mg/dL — ABNORMAL HIGH (ref 70–99)
Potassium: 4 mmol/L (ref 3.5–5.1)
Sodium: 129 mmol/L — ABNORMAL LOW (ref 135–145)
Total Bilirubin: 1.6 mg/dL — ABNORMAL HIGH (ref 0.3–1.2)
Total Protein: 4.6 g/dL — ABNORMAL LOW (ref 6.5–8.1)

## 2022-09-25 LAB — PROCALCITONIN: Procalcitonin: 0.61 ng/mL

## 2022-09-25 LAB — GLUCOSE, CAPILLARY: Glucose-Capillary: 119 mg/dL — ABNORMAL HIGH (ref 70–99)

## 2022-09-25 LAB — MAGNESIUM: Magnesium: 1.5 mg/dL — ABNORMAL LOW (ref 1.7–2.4)

## 2022-09-25 LAB — MRSA NEXT GEN BY PCR, NASAL: MRSA by PCR Next Gen: NOT DETECTED

## 2022-09-25 LAB — PHOSPHORUS: Phosphorus: 2.3 mg/dL — ABNORMAL LOW (ref 2.5–4.6)

## 2022-09-25 MED ORDER — MIDODRINE HCL 5 MG PO TABS
10.0000 mg | ORAL_TABLET | Freq: Three times a day (TID) | ORAL | Status: DC
  Administered 2022-09-25 – 2022-09-26 (×3): 10 mg via ORAL
  Filled 2022-09-25 (×3): qty 2

## 2022-09-25 MED ORDER — POLYETHYLENE GLYCOL 3350 17 G PO PACK: 17.0000 g | PACK | Freq: Every day | ORAL | Status: DC | PRN

## 2022-09-25 MED ORDER — DOCUSATE SODIUM 100 MG PO CAPS: 100.0000 mg | ORAL_CAPSULE | Freq: Two times a day (BID) | ORAL | Status: DC | PRN

## 2022-09-25 MED ORDER — ENSURE ENLIVE PO LIQD
237.0000 mL | Freq: Two times a day (BID) | ORAL | Status: DC
  Administered 2022-09-26 – 2022-09-28 (×4): 237 mL via ORAL

## 2022-09-25 MED ORDER — NOREPINEPHRINE 4 MG/250ML-% IV SOLN
0.0000 ug/min | INTRAVENOUS | Status: DC
  Administered 2022-09-25: 2 ug/min via INTRAVENOUS
  Filled 2022-09-25: qty 250

## 2022-09-25 MED ORDER — SODIUM CHLORIDE 0.9 % IV SOLN
2.0000 g | Freq: Two times a day (BID) | INTRAVENOUS | Status: DC
  Administered 2022-09-25 – 2022-09-27 (×6): 2 g via INTRAVENOUS
  Filled 2022-09-25 (×7): qty 12.5

## 2022-09-25 MED ORDER — MIDODRINE HCL 5 MG PO TABS: 10.0000 mg | ORAL_TABLET | ORAL | Status: DC

## 2022-09-25 MED ORDER — LINEZOLID 600 MG/300ML IV SOLN
600.0000 mg | Freq: Two times a day (BID) | INTRAVENOUS | Status: DC
  Administered 2022-09-25 (×2): 600 mg via INTRAVENOUS
  Filled 2022-09-25 (×3): qty 300

## 2022-09-25 MED ORDER — MIDODRINE HCL 5 MG PO TABS
10.0000 mg | ORAL_TABLET | ORAL | Status: AC
  Administered 2022-09-25: 10 mg via ORAL
  Filled 2022-09-25: qty 2

## 2022-09-25 MED ORDER — SODIUM CHLORIDE 0.9 % IV BOLUS: 1000.0000 mL | INTRAVENOUS | Status: DC

## 2022-09-25 MED ORDER — ORAL CARE MOUTH RINSE: 15.0000 mL | OROMUCOSAL | Status: DC | PRN

## 2022-09-25 MED ORDER — JUVEN PO PACK
1.0000 | PACK | Freq: Two times a day (BID) | ORAL | Status: DC
  Administered 2022-09-25 – 2022-09-28 (×5): 1 via ORAL
  Filled 2022-09-25 (×5): qty 1

## 2022-09-25 MED ORDER — SIMETHICONE 80 MG PO CHEW
80.0000 mg | CHEWABLE_TABLET | Freq: Four times a day (QID) | ORAL | Status: DC | PRN
  Administered 2022-09-25: 80 mg via ORAL
  Filled 2022-09-25 (×3): qty 1

## 2022-09-25 MED ORDER — SODIUM PHOSPHATES 45 MMOLE/15ML IV SOLN
30.0000 mmol | Freq: Once | INTRAVENOUS | Status: AC
  Administered 2022-09-25: 30 mmol via INTRAVENOUS
  Filled 2022-09-25: qty 10

## 2022-09-25 MED ORDER — SODIUM CHLORIDE 0.9 % IV BOLUS: 250.0000 mL | INTRAVENOUS | Status: DC

## 2022-09-25 MED ORDER — CHLORHEXIDINE GLUCONATE CLOTH 2 % EX PADS
6.0000 | MEDICATED_PAD | Freq: Every day | CUTANEOUS | Status: DC
  Administered 2022-09-25 – 2022-09-28 (×3): 6 via TOPICAL

## 2022-09-25 MED ORDER — NOREPINEPHRINE 4 MG/250ML-% IV SOLN
2.0000 ug/min | INTRAVENOUS | Status: DC
  Administered 2022-09-25: 1 ug/min via INTRAVENOUS
  Administered 2022-09-25: 4 ug/min via INTRAVENOUS
  Filled 2022-09-25: qty 250

## 2022-09-25 MED ORDER — HEPARIN SODIUM (PORCINE) 5000 UNIT/ML IJ SOLN
5000.0000 [IU] | Freq: Three times a day (TID) | INTRAMUSCULAR | Status: DC
  Administered 2022-09-25 – 2022-09-28 (×9): 5000 [IU] via SUBCUTANEOUS
  Filled 2022-09-25 (×9): qty 1

## 2022-09-25 MED ORDER — SODIUM CHLORIDE 0.9 % IV BOLUS
1000.0000 mL | Freq: Once | INTRAVENOUS | Status: AC
  Administered 2022-09-25: 1000 mL via INTRAVENOUS

## 2022-09-25 MED ORDER — MIDODRINE HCL 5 MG PO TABS
10.0000 mg | ORAL_TABLET | Freq: Three times a day (TID) | ORAL | Status: DC
  Administered 2022-09-25: 10 mg via ORAL
  Filled 2022-09-25: qty 2

## 2022-09-25 MED ORDER — SODIUM CHLORIDE 0.9 % IV BOLUS
500.0000 mL | INTRAVENOUS | Status: AC
  Administered 2022-09-25: 500 mL via INTRAVENOUS

## 2022-09-25 MED ORDER — METRONIDAZOLE 500 MG/100ML IV SOLN
500.0000 mg | Freq: Two times a day (BID) | INTRAVENOUS | Status: DC
  Administered 2022-09-25 (×2): 500 mg via INTRAVENOUS
  Filled 2022-09-25 (×2): qty 100

## 2022-09-25 MED ORDER — SODIUM CHLORIDE 0.9 % IV BOLUS: 500.0000 mL | INTRAVENOUS | Status: DC

## 2022-09-25 MED ORDER — MAGNESIUM SULFATE 2 GM/50ML IV SOLN
2.0000 g | Freq: Once | INTRAVENOUS | Status: AC
  Administered 2022-09-25: 2 g via INTRAVENOUS
  Filled 2022-09-25: qty 50

## 2022-09-25 MED ORDER — SODIUM CHLORIDE 0.9 % IV SOLN: 250.0000 mL | INTRAVENOUS | Status: DC

## 2022-09-25 NOTE — Progress Notes (Signed)
Pharmacy Antibiotic Note  Edward Campos is a 89 y.o. male admitted on 09/24/2022 with pneumonia.  Pharmacy has been consulted for Cefepime dosing. WBC 8.9. Scr 1.36. Tmax 99.2. Some hypotension.   Plan: Zyvox/Flagyl per MD Cefepime 2g IV q12h  Trend WBC, temp, renal function  F/U infectious work-up   Temp (24hrs), Avg:98.6 F (37 C), Min:98.2 F (36.8 C), Max:99.2 F (37.3 C)  Recent Labs  Lab 09/24/22 1640 09/25/22 0337  WBC 10.8* 8.9  CREATININE 1.36*  --     Estimated Creatinine Clearance: 38.3 mL/min (A) (by C-G formula based on SCr of 1.36 mg/dL (H)).    Allergies  Allergen Reactions   Penicillins Other (See Comments)    UNSPECIFIED REACTION  Did it involve swelling of the face/tongue/throat, SOB, or low BP? Unknown Did it involve sudden or severe rash/hives, skin peeling, or any reaction on the inside of your mouth or nose? Unknown Did you need to seek medical attention at a hospital or doctor's office? Unknown When did it last happen? Between ages [age]   If all above answers are "NO", may proceed with cephalosporin use.     Abran Duke, PharmD, BCPS Clinical Pharmacist Phone: [phone]

## 2022-09-25 NOTE — ED Notes (Signed)
BP low, informed Provider. Orders promptly received.

## 2022-09-25 NOTE — ED Notes (Signed)
More orders received for low BP. Patient is alert, oriented, asymptomatic. Rechecked cuff and ensured it was proper size and position. Pressures still low, another bolus hung. Urinary output 200 mL

## 2022-09-25 NOTE — ED Notes (Signed)
1st Lac normal, 2nd not needed.  TH

## 2022-09-25 NOTE — Consult Note (Signed)
WOC Nurse Consult Note: Reason for Consult:wound vac; right groin Patient is s/p I&D of the right groin in early June. Followed by [facility]; contacted Doreatha Massed to notify of patient's admission. NPWT device from home removed, area is small with opening 0.5cm x 0.5cm with a 2 cm tunnel at 3 o'clock  Wound type: surgical  Pressure Injury POA: NA Measurement:see above  Wound XBM:WUXLKG to visualize Drainage (amount, consistency, odor) none Periwound: skin irritation from foam and suction TRAC pad.  Dressing procedure/placement/frequency: Discussed with [facility] PA that the wound is small and with the opening I feel that we could transition to silver hydrofiber to encourage the wound to fill in.  She is in agreement. Stressed importance to patient to have his daughter take his home VAC home.  HE DOES NOT HAVE CHARGER WITH HIM. HOME VAC, CANISTER REMOVED AND DISCARDED. HOME VAC AND CASE PLACED IN THE PATIENT BLUE HOME BAG.   Ok for staff to change silver hydrofiber packing daily while inpatient.   Discussed POC with patient and bedside nurse.  Re consult if needed, will not follow at this time. Thanks  Andrius Andrepont [employer], RN,CWOCN, CNS, CWON-AP [phone])

## 2022-09-25 NOTE — Consult Note (Addendum)
Pt admitted yesterday with pneumonia.    Pt was last seen in our office about a week ago.  He is  s/p 1.  Irrigation and debridement with VAC change of wound overlying femoral-femoral bypass graft 2.  Application of 38 cm skin substitute using Kerecis to wound overlying femoral-femoral bypass (H0865) on 08/22/22 by Dr. Chestine Spore. Prior to this he underwent  1.  Sharp excisional debridement of skin and subcutaneous tissue overlying the femoral-femoral bypass graft to 5 x 2 x 3 cm and pulse evac lavage totaling 2 L 2.  Application 38 cm fish skin with negative pressure wound VAC dressing on 08/16/22 by Dr. Randie Heinz. He was indicated for this following history of right to left femorofemoral bypass presenting with evidence of infection of his femorofemoral bypass graft. He also has known history of AAA. He did well post operatively and was discharged home on 08/24/22. He was discharged on Flagyl and Cefpodoxime x 14 days.   WOC was called to remove his home vac.  Wound as pictured below.  A wet to dry 2x2 fit into this wound.  She is going to use Silver packing as the wound is probably too small for vac.      Wound is clean without evidence of infection.    Bilateral feet are warm and well perfused.  The left DP pulse is palpable.    Continue dressing changes with WOC.    Doreatha Massed, [facility] 09/25/2022 9:26 AM  I have independently interviewed and examined patient and agree with PA assessment and plan above.  Wound does appear to be healing although unknown if there is granulation tissue on top of graft.  At this time there is no evidence of infection and will continue wound care as noted above.  Hopeful to avoid any further surgeries.  Adarryl Goldammer C. Randie Heinz, MD [facility] Office: [phone] Pager: [phone]

## 2022-09-25 NOTE — Consult Note (Signed)
NAME:  Edward Campos, MRN:  [id number], DOB:  1932-08-13, LOS: 1 ADMISSION DATE:  09/24/2022 CONSULTATION DATE:  09/25/2022 REFERRING MD:  Margo Aye - [facility] CHIEF COMPLAINT:  Hypotension, PNA   History of Present Illness:  89 year old man who presented to [facility] 7/15 for SOB and cough. PMHx significant for SCLC with LLL mass (diagnosed 08/2022), HLD, PVD (s/p fem-fem bypass grafting), AAA, BPH, anxiety Recent admission to [facility] 6/6 - 6/14 for fem-fem bypass grafting c/b infection requiring washout, VAC placement and broad-spectrum antibiotics.  Per chart review, patient's daughter initially contacted [facility] office 7/15AM reporting that patient was SOB with cough productive of white sputum, diaphoretic and pale for about one week PTA, worsened over the few days. Advised to present to UC and eventually came to ED for evaluation. On ED arrival, patient reported two episodes of central chest pain (~5 min/episode) and lower blood pressures than usual. Afebrile, HR 71, RR 24, BP 84/52, SpO2 100%. Labs were notable for WBC 10.8 Hgb 12.9, Plt 213. Na 131, K 4.0, CO2 22, Cr 1.36 (baseline ~1.1). Trop 13 > 12. LA 1.1, PCT 0.61. CXR with increasing infiltrates L lung base, postobstructive PNA. CTA Chest negative for PE, +L lung mass c/w known malignancy, increased irregular airspace disease (LLL) c/w postobstructive PNA and skeletal metastases. Despite fluid resuscitation and midodrine, patient remained hypotensive and peripheral vasopressors were initiated.  PCCM consulted for ICU admission in the setting of postobstructive PNA and vasopressor initiation.  Pertinent Medical History:   Past Medical History:  Diagnosis Date   AAA (abdominal aortic aneurysm) (HCC)    Anxiety    Arthritis    BPH (benign prostatic hyperplasia)    Dizziness    Hyperlipidemia    PVD (peripheral vascular disease) (HCC)    S/p fem-fem bypass grafting 08/2022   SCLC (small cell lung carcinoma), left (HCC)    Significant Hospital  Events: Including procedures, antibiotic start and stop dates in addition to other pertinent events   7/15 - Admitted by [facility] to stepdown for PNA, persistently hypotensive requiring pressors  midodine, PCCM consulted for ICU admission for pressors.  Interim History / Subjective:  PCCM consulted for ICU admission for vasopressor initiation.  Objective:  Blood pressure 105/64, pulse 68, temperature 98.7 F (37.1 C), temperature source Oral, resp. rate 16, SpO2 99%.        Intake/Output Summary (Last 24 hours) at 09/25/2022 3557 Last data filed at 09/25/2022 0235 Gross per 24 hour  Intake --  Output 200 ml  Net -200 ml   There were no vitals filed for this visit.  Physical Examination: General: Acutely ill-appearing elderly man in NAD. Pleasant and conversant. HEENT: [place]/AT, anicteric sclera, PERRL, dry mucous membranes. Neuro: Awake, oriented x 4. Responds to verbal stimuli. Following commands consistently. Moves all 4 extremities spontaneously. CV: RRR, no m/g/r. PULM: Breathing even and unlabored on RA. Lung fields diminished on L. GI: Soft, nontender, nondistended. Normoactive bowel sounds. R lower abdomen/groin with 2cm WV site in place, no surrounding erythema or drainage. Extremities: Trace bilateral symmetric pitting LE edema noted. Skin: Warm/dry, no rashes.  Resolved Hospital Problem List:    Assessment & Plan:  Undifferentiated shock, presume septic in the setting of postobstructive PNA - Admit to ICU for close monitoring, vasopressors - Goal MAP > 65 - Fluid resuscitation as tolerated - Levophed titrated to goal MAP - Continue midodrine - Trend WBC, fever curve, LA - Solu-Cortef x 5-day course - F/u Cx data, PCT - Continue broad-spectrum antibiotics (cefepime,  Zyvox, Flagyl)  Postobstructive PNA LLL lung mass, SCLC with bony metastasis CTA Chest 7/15 negative for PE, LLL lung mass c/w malignancy, increased irregular airspace disease LLL c/f postobstructive PNA,  internal increase L hilar LNs, lucent lesions of vertebrae.  - Supplemental O2 support as needed for SpO2 > 90% - Daily CXR until clinically improving - F/u Resp Cx, legionella/strep - Pulmonary hygiene  Severe PVD S/p fem-fem bypass grafting 08/2022 - Management per VVS - WV in place, WOC consult as indicated  HLD AAA - Continue ASA/statin - Cardiac monitoring - Maintain good BP control  BPH - Monitor I&Os  Best Practice: (right click and "Reselect all SmartList Selections" daily)   Diet/type: Regular consistency (see orders) DVT prophylaxis: SCDs, SQH GI prophylaxis: N/A Lines: N/A Foley:  N/A Code Status:  full code Last date of multidisciplinary goals of care discussion [Pending]  Labs:  CBC: Recent Labs  Lab 09/24/22 1640 09/25/22 0337  WBC 10.8* 8.9  NEUTROABS  --  6.8  HGB 12.9* 11.1*  HCT 37.1* 31.6*  MCV 88.5 87.3  PLT 213 177   Basic Metabolic Panel: Recent Labs  Lab 09/24/22 1640 09/25/22 0337  NA 131* 129*  K 4.0 4.0  CL 95* 103  CO2 22 19*  GLUCOSE 99 103*  BUN 38* 31*  CREATININE 1.36* 1.11  CALCIUM 8.7* 7.5*  MG  --  1.5*  PHOS  --  2.3*   GFR: Estimated Creatinine Clearance: 46.9 mL/min (by C-G formula based on SCr of 1.11 mg/dL). Recent Labs  Lab 09/24/22 1640 09/25/22 0337  PROCALCITON  --  0.61  WBC 10.8* 8.9  LATICACIDVEN  --  1.1   Liver Function Tests: Recent Labs  Lab 09/25/22 0337  AST 26  ALT 29  ALKPHOS 53  BILITOT 1.6*  PROT 4.6*  ALBUMIN 2.5*   No results for input(s): "LIPASE", "AMYLASE" in the last 168 hours. No results for input(s): "AMMONIA" in the last 168 hours.  ABG:    Component Value Date/Time   TCO2 25 07/06/2019 0919   Coagulation Profile: No results for input(s): "INR", "PROTIME" in the last 168 hours.  Cardiac Enzymes: No results for input(s): "CKTOTAL", "CKMB", "CKMBINDEX", "TROPONINI" in the last 168 hours.  HbA1C: Hgb A1c MFr Bld  Date/Time Value Ref Range Status  05/29/2019  02:50 PM 5.0 4.8 - 5.6 % Final    Comment:             Prediabetes: 5.7 - 6.4          Diabetes: >6.4          Glycemic control for adults with diabetes: <7.0    CBG: No results for input(s): "GLUCAP" in the last 168 hours.  Review of Systems:   Review of systems completed with pertinent positives/negatives outlined in above HPI.  Past Medical History:  He,  has a past medical history of AAA (abdominal aortic aneurysm) (HCC), Anxiety, Arthritis, BPH (benign prostatic hyperplasia), Dizziness, Hyperlipidemia, PVD (peripheral vascular disease) (HCC), and SCLC (small cell lung carcinoma), left (HCC).   Surgical History:   Past Surgical History:  Procedure Laterality Date   ABDOMINAL AORTOGRAM W/LOWER EXTREMITY N/A 05/06/2019   Procedure: ABDOMINAL AORTOGRAM W/LOWER EXTREMITY;  Surgeon: Cephus Shelling, MD;  Location: [facility] INVASIVE CV LAB;  Service: Cardiovascular;  Laterality: N/A;   ABDOMINAL WOUND DEHISCENCE N/A 08/22/2022   Procedure: WASHOUT ABDOMEN WOUND AND VAC CHANGE;  Surgeon: Cephus Shelling, MD;  Location: [facility] OR;  Service: Vascular;  Laterality: N/A;  APPLICATION OF WOUND VAC  07/06/2019   Procedure: Application Of Wound Vac Left Groin;  Surgeon: Sherren Kerns, MD;  Location: [facility] OR;  Service: Vascular;;   APPLICATION OF WOUND VAC N/A 08/16/2022   Procedure: APPLICATION OF WOUND VAC;  Surgeon: [name], MD;  Location: [facility] OR;  Service: Vascular;  Laterality: N/A;   BRONCHIAL BIOPSY  08/21/2022   Procedure: BRONCHIAL BIOPSIES;  Surgeon: Oretha Milch, MD;  Location: [facility] ENDOSCOPY;  Service: Cardiopulmonary;;   BRONCHIAL BRUSHINGS  08/21/2022   Procedure: BRONCHIAL BRUSHINGS;  Surgeon: Oretha Milch, MD;  Location: [facility] ENDOSCOPY;  Service: Cardiopulmonary;;   BRONCHIAL WASHINGS  08/21/2022   Procedure: BRONCHIAL WASHINGS;  Surgeon: Oretha Milch, MD;  Location: [facility] ENDOSCOPY;  Service: Cardiopulmonary;;   CARDIOVERSION N/A 11/03/2020   Procedure:  CARDIOVERSION;  Surgeon: Thurmon Fair, MD;  Location: [facility] ENDOSCOPY;  Service: Cardiovascular;  Laterality: N/A;   ENDARTERECTOMY FEMORAL Bilateral 06/08/2019   Procedure: ENDARTERECTOMY RIGHT COMMON FEMORAL; ENDARTERECTOMY RIGHT EXTERNAL ILIAC; ENDARTERECTOMY LEFT COMMON FEMORAL; ENDARTERECTOMY LEFT EXTERNAL ILIAC;  Surgeon: Sherren Kerns, MD;  Location: [facility] OR;  Service: Vascular;  Laterality: Bilateral;   FEMORAL-FEMORAL BYPASS GRAFT Bilateral 06/08/2019   Procedure: BYPASS GRAFT FEMORAL-FEMORAL ARTERY;  Surgeon: Sherren Kerns, MD;  Location: [facility] OR;  Service: Vascular;  Laterality: Bilateral;   GROIN DEBRIDEMENT Left 07/06/2019   Procedure: INCISION AND DRAINAGE OF LEFT GROIN;  Surgeon: Sherren Kerns, MD;  Location: [facility] OR;  Service: Vascular;  Laterality: Left;   growth removed from chest     HEMORRHOID SURGERY     HEMOSTASIS CONTROL  08/21/2022   Procedure: HEMOSTASIS CONTROL;  Surgeon: Oretha Milch, MD;  Location: [facility] ENDOSCOPY;  Service: Cardiopulmonary;;   HERNIA REPAIR  12 yrs ago   left inguinal hernia   INCISE AND DRAIN ABCESS Left 07/06/2019   Incision and drainage left groin placement of VAC dressing   INCISION AND DRAINAGE OF WOUND Right 08/16/2022   Procedure: IRRIGATION AND DEBRIDEMENT WOUND;  Surgeon: [name], MD;  Location: [facility] OR;  Service: Vascular;  Laterality: Right;   INGUINAL HERNIA REPAIR  10/16/2010   Procedure: HERNIA REPAIR INGUINAL ADULT;  Surgeon: Dalia Heading;  Location: [facility] ORS;  Service: General;  Laterality: Right;   INSERTION OF ILIAC STENT Right 06/08/2019   Procedure: INSERTION OF RIGHT COMMON ILIAC AND EXTERNAL ILICAC STENT;  Surgeon: Sherren Kerns, MD;  Location: [facility] OR;  Service: Vascular;  Laterality: Right;   PATCH ANGIOPLASTY Right 06/08/2019   Procedure: Patch Angioplasty Right Common Femoral;  Surgeon: Sherren Kerns, MD;  Location: [facility] OR;  Service: Vascular;  Laterality: Right;   VIDEO BRONCHOSCOPY N/A 08/21/2022    Procedure: VIDEO BRONCHOSCOPY WITH FLUORO;  Surgeon: Oretha Milch, MD;  Location: [facility] ENDOSCOPY;  Service: Cardiopulmonary;  Laterality: N/A;   Social History:   reports that he quit smoking about 3 years ago. His smoking use included cigarettes. He started smoking about 73 years ago. He has a 105 pack-year smoking history. He has never used smokeless tobacco. He reports current alcohol use of about 3.0 standard drinks of alcohol per week. He reports that he does not use drugs.   Family History:  His family history includes Stroke in his mother. There is no history of Pseudochol deficiency, Malignant hyperthermia, Hypotension, or Anesthesia problems.   Allergies: Allergies  Allergen Reactions   Penicillins Other (See Comments)    UNSPECIFIED REACTION  Did it involve swelling of the face/tongue/throat, SOB,  or low BP? Unknown Did it involve sudden or severe rash/hives, skin peeling, or any reaction on the inside of your mouth or nose? Unknown Did you need to seek medical attention at a hospital or doctor's office? Unknown When did it last happen? Between ages 26-13   If all above answers are "NO", may proceed with cephalosporin use.    Home Medications: Prior to Admission medications   Medication Sig Start Date End Date Taking? Authorizing Provider  aspirin EC 81 MG tablet Take 1 tablet (81 mg total) by mouth daily. Swallow whole. 06/30/21   Sande Rives, MD  diphenhydramine-acetaminophen (TYLENOL PM) 25-500 MG TABS tablet Take 1 tablet by mouth at bedtime as needed (Sleep).    [provider]  ibuprofen (ADVIL) 200 MG tablet Take 400 mg by mouth every 6 (six) hours as needed. Patient not taking: Reported on 09/19/2022    [provider]  Multiple Vitamins-Minerals (MULTIVITAMIN WITH MINERALS) tablet Take 1 tablet by mouth daily.    [provider]  rosuvastatin (CRESTOR) 20 MG tablet TAKE 1 TABLET(20 MG) BY MOUTH DAILY 07/09/22   [name], MD  Simethicone (GAS RELIEF EXTRA STRENGTH PO) Take 2 tablets by mouth daily as needed (gas).    [provider]   Critical care time:    The patient is critically ill with multiple organ system failure and requires high complexity decision making for assessment and support, frequent evaluation and titration of therapies, advanced monitoring, review of radiographic studies and interpretation of complex data.   Critical Care Time devoted to patient care services, exclusive of separately billable procedures, described in this note is 38 minutes.  Tim Lair, PA-C [facility] 09/25/22 6:09 AM  Please see [email or web address] for pager details.  From 7A-7P if no response, please call [phone] After hours, please call ELink [phone]

## 2022-09-25 NOTE — Progress Notes (Signed)
NAME:  Edward Campos, MRN:  [id number], DOB:  08-18-1932, LOS: 1 ADMISSION DATE:  09/24/2022, CONSULTATION DATE:  09/25/2022 REFERRING MD:  Dr Donella Stade, CHIEF COMPLAINT:  hypotension, PNA   History of Present Illness:  89 year old man who presented to [facility] 7/15 for SOB and cough. PMHx significant for SCLC with LLL mass (diagnosed 08/2022), HLD, PVD (s/p fem-fem bypass grafting), AAA, BPH, anxiety Recent admission to [facility] 6/6 - 6/14 for fem-fem bypass grafting c/b infection requiring washout, VAC placement and broad-spectrum antibiotics.   Per chart review, patient's daughter initially contacted [facility] office 7/15AM reporting that patient was SOB with cough productive of white sputum, diaphoretic and pale for about one week PTA, worsened over the few days. Advised to present to UC and eventually came to ED for evaluation. On ED arrival, patient reported two episodes of central chest pain (~5 min/episode) and lower blood pressures than usual. Afebrile, HR 71, RR 24, BP 84/52, SpO2 100%. Labs were notable for WBC 10.8 Hgb 12.9, Plt 213. Na 131, K 4.0, CO2 22, Cr 1.36 (baseline ~1.1). Trop 13 > 12. LA 1.1, PCT 0.61. CXR with increasing infiltrates L lung base, postobstructive PNA. CTA Chest negative for PE, +L lung mass c/w known malignancy, increased irregular airspace disease (LLL) c/w postobstructive PNA and skeletal metastases. Despite fluid resuscitation and midodrine, patient remained hypotensive and peripheral vasopressors were initiated.   PCCM consulted for ICU admission in the setting of postobstructive PNA and vasopressor initiation.  Pertinent  Medical History   Past Medical History:  Diagnosis Date   AAA (abdominal aortic aneurysm) (HCC)    Anxiety    Arthritis    BPH (benign prostatic hyperplasia)    Dizziness    Hyperlipidemia    PVD (peripheral vascular disease) (HCC)    S/p fem-fem bypass grafting 08/2022   SCLC (small cell lung carcinoma), left (HCC)    Significant  Hospital Events: Including procedures, antibiotic start and stop dates in addition to other pertinent events   7/15 - Admitted by [facility] to stepdown for PNA, persistently hypotensive requiring pressors  midodine, PCCM consulted for ICU admission for pressors.  Interim History / Subjective:  Requiring vasopressors  Objective   Blood pressure 103/68, pulse 74, temperature 98.3 F (36.8 C), temperature source Oral, resp. rate (!) 23, weight 77.8 kg, SpO2 94%.        Intake/Output Summary (Last 24 hours) at 09/25/2022 1043 Last data filed at 09/25/2022 4098 Gross per 24 hour  Intake 1883.31 ml  Output 450 ml  Net 1433.31 ml   Filed Weights   09/25/22 0730  Weight: 77.8 kg    Examination: General: Acutely ill, pleasant HENT: Dry oral mucosa Lungs: Decreased air entry on the left Cardiovascular: S1-S2 appreciated Abdomen: Soft, bowel sounds appreciated Extremities: Bilateral lower extremity edema Neuro: Alert and oriented GU:   Resolved Hospital Problem list     Assessment & Plan:  Undifferentiated shock -Likely secondary to postobstructive pneumonia -Maintain ICU admission -Levophed for goal MAP greater than 65 -Continue Solu-Cortef -Continue cefepime, Zyvox, Flagyl  Postobstructive pneumonia Left lower lobe lung mass small cell lung cancer with bony metastases -Continue oxygen supplementation Follow respiratory cultures  Severe peripheral vascular disease S/p femorofemoral bypass grafting -Wound management  Hyperlipidemia AAA -Continue cardiac monitoring    Best Practice (right click and "Reselect all SmartList Selections" daily)   Diet/type: Regular consistency (see orders) DVT prophylaxis: prophylactic heparin  GI prophylaxis: N/A Lines: N/A Foley:  N/A Code Status:  full code Last date of  multidisciplinary goals of care discussion [pending]  Labs   CBC: Recent Labs  Lab 09/24/22 1640 09/25/22 0337  WBC 10.8* 8.9  NEUTROABS  --  6.8  HGB 12.9*  11.1*  HCT 37.1* 31.6*  MCV 88.5 87.3  PLT 213 177    Basic Metabolic Panel: Recent Labs  Lab 09/24/22 1640 09/25/22 0337  NA 131* 129*  K 4.0 4.0  CL 95* 103  CO2 22 19*  GLUCOSE 99 103*  BUN 38* 31*  CREATININE 1.36* 1.11  CALCIUM 8.7* 7.5*  MG  --  1.5*  PHOS  --  2.3*   GFR: Estimated Creatinine Clearance: 46.9 mL/min (by C-G formula based on SCr of 1.11 mg/dL). Recent Labs  Lab 09/24/22 1640 09/25/22 0337 09/25/22 0654  PROCALCITON  --  0.61  --   WBC 10.8* 8.9  --   LATICACIDVEN  --  1.1 1.3    Liver Function Tests: Recent Labs  Lab 09/25/22 0337  AST 26  ALT 29  ALKPHOS 53  BILITOT 1.6*  PROT 4.6*  ALBUMIN 2.5*   No results for input(s): "LIPASE", "AMYLASE" in the last 168 hours. No results for input(s): "AMMONIA" in the last 168 hours.  ABG    Component Value Date/Time   TCO2 25 07/06/2019 0919     Coagulation Profile: No results for input(s): "INR", "PROTIME" in the last 168 hours.  Cardiac Enzymes: No results for input(s): "CKTOTAL", "CKMB", "CKMBINDEX", "TROPONINI" in the last 168 hours.  HbA1C: Hgb A1c MFr Bld  Date/Time Value Ref Range Status  05/29/2019 02:50 PM 5.0 4.8 - 5.6 % Final    Comment:             Prediabetes: 5.7 - 6.4          Diabetes: >6.4          Glycemic control for adults with diabetes: <7.0     CBG: Recent Labs  Lab 09/25/22 0758  GLUCAP 119*    Review of Systems:   Awake alert and interactive Denies any pain, feeling better overall  Past Medical History:  He,  has a past medical history of AAA (abdominal aortic aneurysm) (HCC), Anxiety, Arthritis, BPH (benign prostatic hyperplasia), Dizziness, Hyperlipidemia, PVD (peripheral vascular disease) (HCC), and SCLC (small cell lung carcinoma), left (HCC).   Surgical History:   Past Surgical History:  Procedure Laterality Date   ABDOMINAL AORTOGRAM W/LOWER EXTREMITY N/A 05/06/2019   Procedure: ABDOMINAL AORTOGRAM W/LOWER EXTREMITY;  Surgeon: Cephus Shelling, MD;  Location: [facility] INVASIVE CV LAB;  Service: Cardiovascular;  Laterality: N/A;   ABDOMINAL WOUND DEHISCENCE N/A 08/22/2022   Procedure: WASHOUT ABDOMEN WOUND AND VAC CHANGE;  Surgeon: Cephus Shelling, MD;  Location: [facility] OR;  Service: Vascular;  Laterality: N/A;   APPLICATION OF WOUND VAC  07/06/2019   Procedure: Application Of Wound Vac Left Groin;  Surgeon: Sherren Kerns, MD;  Location: [facility] OR;  Service: Vascular;;   APPLICATION OF WOUND VAC N/A 08/16/2022   Procedure: APPLICATION OF WOUND VAC;  Surgeon: [name], MD;  Location: [facility] OR;  Service: Vascular;  Laterality: N/A;   BRONCHIAL BIOPSY  08/21/2022   Procedure: BRONCHIAL BIOPSIES;  Surgeon: Oretha Milch, MD;  Location: [facility] ENDOSCOPY;  Service: Cardiopulmonary;;   BRONCHIAL BRUSHINGS  08/21/2022   Procedure: BRONCHIAL BRUSHINGS;  Surgeon: Oretha Milch, MD;  Location: [facility] ENDOSCOPY;  Service: Cardiopulmonary;;   BRONCHIAL WASHINGS  08/21/2022   Procedure: BRONCHIAL WASHINGS;  Surgeon: Oretha Milch, MD;  Location: [facility] ENDOSCOPY;  Service: Cardiopulmonary;;   CARDIOVERSION N/A 11/03/2020   Procedure: CARDIOVERSION;  Surgeon: Thurmon Fair, MD;  Location: [facility] ENDOSCOPY;  Service: Cardiovascular;  Laterality: N/A;   ENDARTERECTOMY FEMORAL Bilateral 06/08/2019   Procedure: ENDARTERECTOMY RIGHT COMMON FEMORAL; ENDARTERECTOMY RIGHT EXTERNAL ILIAC; ENDARTERECTOMY LEFT COMMON FEMORAL; ENDARTERECTOMY LEFT EXTERNAL ILIAC;  Surgeon: Sherren Kerns, MD;  Location: [facility] OR;  Service: Vascular;  Laterality: Bilateral;   FEMORAL-FEMORAL BYPASS GRAFT Bilateral 06/08/2019   Procedure: BYPASS GRAFT FEMORAL-FEMORAL ARTERY;  Surgeon: Sherren Kerns, MD;  Location: [facility] OR;  Service: Vascular;  Laterality: Bilateral;   GROIN DEBRIDEMENT Left 07/06/2019   Procedure: INCISION AND DRAINAGE OF LEFT GROIN;  Surgeon: Sherren Kerns, MD;  Location: [facility] OR;  Service: Vascular;  Laterality: Left;   growth removed from chest      HEMORRHOID SURGERY     HEMOSTASIS CONTROL  08/21/2022   Procedure: HEMOSTASIS CONTROL;  Surgeon: Oretha Milch, MD;  Location: [facility] ENDOSCOPY;  Service: Cardiopulmonary;;   HERNIA REPAIR  12 yrs ago   left inguinal hernia   INCISE AND DRAIN ABCESS Left 07/06/2019   Incision and drainage left groin placement of VAC dressing   INCISION AND DRAINAGE OF WOUND Right 08/16/2022   Procedure: IRRIGATION AND DEBRIDEMENT WOUND;  Surgeon: [name], MD;  Location: [facility] OR;  Service: Vascular;  Laterality: Right;   INGUINAL HERNIA REPAIR  10/16/2010   Procedure: HERNIA REPAIR INGUINAL ADULT;  Surgeon: Dalia Heading;  Location: [facility] ORS;  Service: General;  Laterality: Right;   INSERTION OF ILIAC STENT Right 06/08/2019   Procedure: INSERTION OF RIGHT COMMON ILIAC AND EXTERNAL ILICAC STENT;  Surgeon: Sherren Kerns, MD;  Location: [facility] OR;  Service: Vascular;  Laterality: Right;   PATCH ANGIOPLASTY Right 06/08/2019   Procedure: Patch Angioplasty Right Common Femoral;  Surgeon: Sherren Kerns, MD;  Location: [facility] OR;  Service: Vascular;  Laterality: Right;   VIDEO BRONCHOSCOPY N/A 08/21/2022   Procedure: VIDEO BRONCHOSCOPY WITH FLUORO;  Surgeon: Oretha Milch, MD;  Location: [facility] ENDOSCOPY;  Service: Cardiopulmonary;  Laterality: N/A;     Social History:   reports that he quit smoking about 3 years ago. His smoking use included cigarettes. He started smoking about 73 years ago. He has a 105 pack-year smoking history. He has never used smokeless tobacco. He reports current alcohol use of about 3.0 standard drinks of alcohol per week. He reports that he does not use drugs.   Family History:  His family history includes Stroke in his mother. There is no history of Pseudochol deficiency, Malignant hyperthermia, Hypotension, or Anesthesia problems.   Allergies Allergies  Allergen Reactions   Penicillins Other (See Comments)    UNSPECIFIED REACTION  Did it involve swelling of the face/tongue/throat,  SOB, or low BP? Unknown Did it involve sudden or severe rash/hives, skin peeling, or any reaction on the inside of your mouth or nose? Unknown Did you need to seek medical attention at a hospital or doctor's office? Unknown When did it last happen? Between ages 4-13   If all above answers are "NO", may proceed with cephalosporin use.     The patient is critically ill with multiple organ systems failure and requires high complexity decision making for assessment and support, frequent evaluation and titration of therapies, application of advanced monitoring technologies and extensive interpretation of multiple databases. Critical Care Time devoted to patient care services described in this note independent of APP/resident time (if applicable)  is 32 minutes.  Virl Diamond MD [facility] Personal pager: See Amion If unanswered, please page CCM On-call: #[phone]

## 2022-09-25 NOTE — ED Notes (Signed)
ED TO INPATIENT HANDOFF REPORT  ED Nurse Name and Phone #: [phone]   S Name/Age/Gender Edward Campos 89 y.o. male Room/Bed: 031C/031C  Code Status   Code Status: Full Code  Home/SNF/Other Home Patient oriented to: self, place, time, and situation Is this baseline? Yes   Triage Complete: Triage complete  Chief Complaint Pneumonia [J18.9]  Triage Note Patient arrives with multiple complaints from home. Pt went to UC today for same and was advised to come here. Patient had two episodes lasting 5 minutes each of central chest pain. Home health nurse comes to his house to help him change the wound vac three times per week and his blood pressure has been lower than normal when she checks it. Family reports 80/40 today at [facility]. Recent vascular surgery on RLE and has wound vac to same. Also has had several days of shortness of breath productive cough with white sputum.    Allergies Allergies  Allergen Reactions   Penicillins Other (See Comments)    UNSPECIFIED REACTION  Did it involve swelling of the face/tongue/throat, SOB, or low BP? Unknown Did it involve sudden or severe rash/hives, skin peeling, or any reaction on the inside of your mouth or nose? Unknown Did you need to seek medical attention at a hospital or doctor's office? Unknown When did it last happen? Between ages 40-13   If all above answers are "NO", may proceed with cephalosporin use.     Level of Care/Admitting Diagnosis ED Disposition     ED Disposition  Admit   Condition  --   Comment  Hospital Area: [facility] [[id number]]  Level of Care: ICU [6]  May admit patient to [facility] or [facility] if equivalent level of care is available:: No  Covid Evaluation: Asymptomatic - no recent exposure (last 10 days) testing not required  Diagnosis: Pneumonia [227785]  Admitting Physician: Charlott Holler [[id number]]  Attending Physician: Charlott Holler [[id number]]  Certification:: I certify this patient  will need inpatient services for at least 2 midnights          B Medical/Surgery History Past Medical History:  Diagnosis Date   AAA (abdominal aortic aneurysm) (HCC)    Anxiety    Arthritis    BPH (benign prostatic hyperplasia)    Dizziness    Hyperlipidemia    PVD (peripheral vascular disease) (HCC)    S/p fem-fem bypass grafting 08/2022   SCLC (small cell lung carcinoma), left [facility])    Past Surgical History:  Procedure Laterality Date   ABDOMINAL AORTOGRAM W/LOWER EXTREMITY N/A 05/06/2019   Procedure: ABDOMINAL AORTOGRAM W/LOWER EXTREMITY;  Surgeon: Cephus Shelling, MD;  [facility]: [facility] INVASIVE CV LAB;  Service: Cardiovascular;  Laterality: N/A;   ABDOMINAL WOUND DEHISCENCE N/A 08/22/2022   Procedure: WASHOUT ABDOMEN WOUND AND VAC CHANGE;  Surgeon: Cephus Shelling, MD;  Location: [facility] OR;  Service: Vascular;  Laterality: N/A;   APPLICATION OF WOUND VAC  07/06/2019   Procedure: Application Of Wound Vac Left Groin;  Surgeon: Sherren Kerns, MD;  Location: [facility] OR;  Service: Vascular;;   APPLICATION OF WOUND VAC N/A 08/16/2022   Procedure: APPLICATION OF WOUND VAC;  Surgeon: [name], MD;  Location: [facility] OR;  Service: Vascular;  Laterality: N/A;   BRONCHIAL BIOPSY  08/21/2022   Procedure: BRONCHIAL BIOPSIES;  Surgeon: Oretha Milch, MD;  Location: [facility] ENDOSCOPY;  Service: Cardiopulmonary;;   BRONCHIAL BRUSHINGS  08/21/2022   Procedure: BRONCHIAL BRUSHINGS;  Surgeon: Oretha Milch, MD;  Location: [facility] ENDOSCOPY;  Service: Cardiopulmonary;;   BRONCHIAL WASHINGS  08/21/2022   Procedure: BRONCHIAL WASHINGS;  Surgeon: Oretha Milch, MD;  Location: [facility] ENDOSCOPY;  Service: Cardiopulmonary;;   CARDIOVERSION N/A 11/03/2020   Procedure: CARDIOVERSION;  Surgeon: Thurmon Fair, MD;  Location: [facility] ENDOSCOPY;  Service: Cardiovascular;  Laterality: N/A;   ENDARTERECTOMY FEMORAL Bilateral 06/08/2019   Procedure: ENDARTERECTOMY RIGHT COMMON FEMORAL; ENDARTERECTOMY RIGHT EXTERNAL  ILIAC; ENDARTERECTOMY LEFT COMMON FEMORAL; ENDARTERECTOMY LEFT EXTERNAL ILIAC;  Surgeon: Sherren Kerns, MD;  Location: [facility] OR;  Service: Vascular;  Laterality: Bilateral;   FEMORAL-FEMORAL BYPASS GRAFT Bilateral 06/08/2019   Procedure: BYPASS GRAFT FEMORAL-FEMORAL ARTERY;  Surgeon: Sherren Kerns, MD;  Location: [facility] OR;  Service: Vascular;  Laterality: Bilateral;   GROIN DEBRIDEMENT Left 07/06/2019   Procedure: INCISION AND DRAINAGE OF LEFT GROIN;  Surgeon: Sherren Kerns, MD;  Location: [facility] OR;  Service: Vascular;  Laterality: Left;   growth removed from chest     HEMORRHOID SURGERY     HEMOSTASIS CONTROL  08/21/2022   Procedure: HEMOSTASIS CONTROL;  Surgeon: Oretha Milch, MD;  Location: [facility] ENDOSCOPY;  Service: Cardiopulmonary;;   HERNIA REPAIR  12 yrs ago   left inguinal hernia   INCISE AND DRAIN ABCESS Left 07/06/2019   Incision and drainage left groin placement of VAC dressing   INCISION AND DRAINAGE OF WOUND Right 08/16/2022   Procedure: IRRIGATION AND DEBRIDEMENT WOUND;  Surgeon: [name], MD;  Location[facility] OR;  Service: Vascular;  Laterality: Right;   INGUINAL HERNIA REPAIR  10/16/2010   Procedure: HERNIA REPAIR INGUINAL ADULT;  Surgeon: Dalia Heading;  Location: [facility] ORS;  Service: General;  Laterality: Right;   INSERTION OF ILIAC STENT Right 06/08/2019   Procedure: INSERTION OF RIGHT COMMON ILIAC AND EXTERNAL ILICAC STENT;  Surgeon: Sherren Kerns, MD;  Location: [facility] OR;  Service: Vascular;  Laterality: Right;   PATCH ANGIOPLASTY Right 06/08/2019   Procedure: Patch Angioplasty Right Common Femoral;  Surgeon: Sherren Kerns, MD;  Location: [facility] OR;  Service: Vascular;  Laterality: Right;   VIDEO BRONCHOSCOPY N/A 08/21/2022   Procedure: VIDEO BRONCHOSCOPY WITH FLUORO;  Surgeon: Oretha Milch, MD;  Location: [facility] ENDOSCOPY;  Service: Cardiopulmonary;  Laterality: N/A;     A IV Location/Drains/Wounds Patient Lines/Drains/Airways Status     Active Line/Drains/Airways      Name Placement date Placement time Site Days   Peripheral IV 09/24/22 20 G Anterior;Distal;Right;Upper Arm 09/24/22  1948  Arm  1   Peripheral IV 09/25/22 20 G 1.25" Anterior;Right Forearm 09/25/22  0433  Forearm  less than 1   Peripheral IV 09/25/22 20 G 1.75" Anterior;Left;Proximal;Upper Arm 09/25/22  0435  Arm  less than 1   Negative Pressure Wound Therapy Pelvis Lower 08/22/22  1649  --  34            Intake/Output Last 24 hours  Intake/Output Summary (Last 24 hours) at 09/25/2022 0710 Last data filed at 09/25/2022 0235 Gross per 24 hour  Intake --  Output 200 ml  Net -200 ml    Labs/Imaging Results for orders placed or performed during the hospital encounter of 09/24/22 (from the past 48 hour(s))  Basic metabolic panel     Status: Abnormal   Collection Time: 09/24/22  4:40 PM  Result Value Ref Range   Sodium 131 (L) 135 - 145 mmol/L   Potassium 4.0 3.5 - 5.1 mmol/L   Chloride 95 (L) 98 - 111 mmol/L   CO2 22 22 -  32 mmol/L   Glucose, Bld 99 70 - 99 mg/dL    Comment: Glucose reference range applies only to samples taken after fasting for at least 8 hours.   BUN 38 (H) 8 - 23 mg/dL   Creatinine, Ser 3.66 (H) 0.61 - 1.24 mg/dL   Calcium 8.7 (L) 8.9 - 10.3 mg/dL   GFR, Estimated 50 (L) >60 mL/min    Comment: (NOTE) Calculated using the CKD-EPI Creatinine Equation (2021)    Anion gap 14 5 - 15    Comment: Performed at [facility] Lab, [address] [place], [place] [address]  CBC     Status: Abnormal   Collection Time: 09/24/22  4:40 PM  Result Value Ref Range   WBC 10.8 (H) 4.0 - 10.5 K/uL   RBC 4.19 (L) 4.22 - 5.81 MIL/uL   Hemoglobin 12.9 (L) 13.0 - 17.0 g/dL   HCT 74.2 (L) 59.5 - 63.8 %   MCV 88.5 80.0 - 100.0 fL   MCH 30.8 26.0 - 34.0 pg   MCHC 34.8 30.0 - 36.0 g/dL   RDW 75.6 43.3 - 29.5 %   Platelets 213 150 - 400 K/uL   nRBC 0.0 0.0 - 0.2 %    Comment: Performed at [facility] Lab, [address], [place], [place] [address]  Troponin I (High  Sensitivity)     Status: None   Collection Time: 09/24/22  4:40 PM  Result Value Ref Range   Troponin I (High Sensitivity) 13 <18 ng/L    Comment: (NOTE) Elevated high sensitivity troponin I (hsTnI) values and significant  changes across serial measurements may suggest ACS but many other  chronic and acute conditions are known to elevate hsTnI results.  Refer to the "Links" section for chest pain algorithms and additional  guidance. Performed at [facility], [address], [place], [place] [address]   Troponin I (High Sensitivity)     Status: None   Collection Time: 09/24/22  6:49 PM  Result Value Ref Range   Troponin I (High Sensitivity) 12 <18 ng/L    Comment: (NOTE) Elevated high sensitivity troponin I (hsTnI) values and significant  changes across serial measurements may suggest ACS but many other  chronic and acute conditions are known to elevate hsTnI results.  Refer to the "Links" section for chest pain algorithms and additional  guidance. Performed at [facility] Lab, [address], [place], [place] [address]   MRSA Next Gen by PCR, Nasal     Status: None   Collection Time: 09/24/22 10:58 PM   Specimen: Nasal Mucosa; Nasal Swab  Result Value Ref Range   MRSA by PCR Next Gen NOT DETECTED NOT DETECTED    Comment: (NOTE) The GeneXpert MRSA Assay (FDA approved for NASAL specimens only), is one component of a comprehensive MRSA colonization surveillance program. It is not intended to diagnose MRSA infection nor to guide or monitor treatment for MRSA infections. Test performance is not FDA approved in patients less than 45 years old. Performed at [facility] Lab, [address], [place], [place] [address]   CBC with Differential/Platelet     Status: Abnormal   Collection Time: 09/25/22  3:37 AM  Result Value Ref Range   WBC 8.9 4.0 - 10.5 K/uL   RBC 3.62 (L) 4.22 - 5.81 MIL/uL   Hemoglobin 11.1 (L) 13.0 - 17.0 g/dL   HCT 55.7 (L) 32.2 - 02.5 %   MCV  87.3 80.0 - 100.0 fL  MCH 30.7 26.0 - 34.0 pg   MCHC 35.1 30.0 - 36.0 g/dL   RDW 84.1 32.4 - 40.1 %   Platelets 177 150 - 400 K/uL   nRBC 0.0 0.0 - 0.2 %   Neutrophils Relative % 76 %   Neutro Abs 6.8 1.7 - 7.7 K/uL   Lymphocytes Relative 3 %   Lymphs Abs 0.3 (L) 0.7 - 4.0 K/uL   Monocytes Relative 9 %   Monocytes Absolute 0.8 0.1 - 1.0 K/uL   Eosinophils Relative 10 %   Eosinophils Absolute 0.9 (H) 0.0 - 0.5 K/uL   Basophils Relative 1 %   Basophils Absolute 0.1 0.0 - 0.1 K/uL   Immature Granulocytes 1 %   Abs Immature Granulocytes 0.05 0.00 - 0.07 K/uL    Comment: Performed at [facility], [address], [place], [place] [address]  Comprehensive metabolic panel     Status: Abnormal   Collection Time: 09/25/22  3:37 AM  Result Value Ref Range   Sodium 129 (L) 135 - 145 mmol/L   Potassium 4.0 3.5 - 5.1 mmol/L   Chloride 103 98 - 111 mmol/L   CO2 19 (L) 22 - 32 mmol/L   Glucose, Bld 103 (H) 70 - 99 mg/dL    Comment: Glucose reference range applies only to samples taken after fasting for at least 8 hours.   BUN 31 (H) 8 - 23 mg/dL   Creatinine, Ser 3.66 0.61 - 1.24 mg/dL   Calcium 7.5 (L) 8.9 - 10.3 mg/dL   Total Protein 4.6 (L) 6.5 - 8.1 g/dL   Albumin 2.5 (L) 3.5 - 5.0 g/dL   AST 26 15 - 41 U/L   ALT 29 0 - 44 U/L   Alkaline Phosphatase 53 38 - 126 U/L   Total Bilirubin 1.6 (H) 0.3 - 1.2 mg/dL   GFR, Estimated >44 >03 mL/min    Comment: (NOTE) Calculated using the CKD-EPI Creatinine Equation (2021)    Anion gap 7 5 - 15    Comment: Performed at [facility], [address], [place], [place] [address]  Magnesium     Status: Abnormal   Collection Time: 09/25/22  3:37 AM  Result Value Ref Range   Magnesium 1.5 (L) 1.7 - 2.4 mg/dL    Comment: Performed at [facility], [address] [place], [place] [address]  Phosphorus     Status: Abnormal   Collection Time: 09/25/22  3:37 AM  Result Value Ref Range   Phosphorus 2.3 (L) 2.5 - 4.6 mg/dL     Comment: Performed at [facility] Lab, [address] [place], [place] [address]  Lactic acid, plasma     Status: None   Collection Time: 09/25/22  3:37 AM  Result Value Ref Range   Lactic Acid, Venous 1.1 0.5 - 1.9 mmol/L    Comment: Performed at [facility], [address] [place], [place] [address]  Procalcitonin     Status: None   Collection Time: 09/25/22  3:37 AM  Result Value Ref Range   Procalcitonin 0.61 ng/mL    Comment:        Interpretation: PCT > 0.5 ng/mL and <= 2 ng/mL: Systemic infection (sepsis) is possible, but other conditions are known to elevate PCT as well. (NOTE)       Sepsis PCT Algorithm           Lower Respiratory Tract  Infection PCT Algorithm    ----------------------------     ----------------------------         PCT < 0.25 ng/mL                PCT < 0.10 ng/mL          Strongly encourage             Strongly discourage   discontinuation of antibiotics    initiation of antibiotics    ----------------------------     -----------------------------       PCT 0.25 - 0.50 ng/mL            PCT 0.10 - 0.25 ng/mL               OR       >80% decrease in PCT            Discourage initiation of                                            antibiotics      Encourage discontinuation           of antibiotics    ----------------------------     -----------------------------         PCT >= 0.50 ng/mL              PCT 0.26 - 0.50 ng/mL                AND       <80% decrease in PCT             Encourage initiation of                                             antibiotics       Encourage continuation           of antibiotics    ----------------------------     -----------------------------        PCT >= 0.50 ng/mL                  PCT > 0.50 ng/mL               AND         increase in PCT                  Strongly encourage                                      initiation of antibiotics    Strongly encourage  escalation           of antibiotics                                     -----------------------------                                           PCT <= 0.25 ng/mL  OR                                        > 80% decrease in PCT                                      Discontinue / Do not initiate                                             antibiotics  Performed at [facility], [address], [place], [place] [address]    CT Angio Chest PE W/Cm &/Or Wo Cm  Result Date: 09/24/2022 CLINICAL DATA:  Shortness of breath chest pain EXAM: CT ANGIOGRAPHY CHEST WITH CONTRAST TECHNIQUE: Multidetector CT imaging of the chest was performed using the standard protocol during bolus administration of intravenous contrast. Multiplanar CT image reconstructions and MIPs were obtained to evaluate the vascular anatomy. RADIATION DOSE REDUCTION: This exam was performed according to the departmental dose-optimization program which includes automated exposure control, adjustment of the mA and/or kV according to patient size and/or use of iterative reconstruction technique. CONTRAST:  75mL OMNIPAQUE IOHEXOL 350 MG/ML SOLN COMPARISON:  Chest x-ray 09/24/2022, PET CT 09/06/2022, CT chest 08/19/2022 FINDINGS: Cardiovascular: Satisfactory opacification of the pulmonary arteries to the segmental level. No evidence of pulmonary embolism. Advanced aortic atherosclerosis. Mild aneurysmal dilatation up to 4.1 cm. Coronary vascular calcifications. No pericardial effusion. Mediastinum/Nodes: Midline trachea. No thyroid mass. Multiple small AP window lymph nodes. Esophagus within normal limits. Left hilar node measuring 3 cm compared with 2 cm previously, now contiguous with the left lower lobe lung mass. Left hilar node slightly superior measures 19 mm compared with 13 mm previously. A small right hilar lymph node measures 12 mm. Lungs/Pleura: Emphysema. Left lower lobe irregular  solid mass exhibits slightly different morphology compared to the prior CT, it measures about 6 0.1 by 4.8 cm compared with 6 x 4.7 cm. Increased irregular airspace disease in the left lower lobe compared to prior. No significant pleural effusion. Upper Abdomen: No acute abnormality. Musculoskeletal: No acute osseous abnormality. Faintly visible lucent lesions within the thoracic vertebra, corresponding to history of metastatic disease on PET CT but better visualized on that exam Review of the MIP images confirms the above findings. IMPRESSION: 1. Negative for acute pulmonary embolus. 2. Left lower lobe lung mass consistent with known malignancy. Increased irregular airspace disease in the left lower lobe compared to prior suggesting postobstructive pneumonia. 3. Slight interval increase in size of left hilar lymph nodes, consistent with metastatic disease. 4. Faintly visible lucent lesions within the thoracic vertebra, corresponding to history of metastatic disease. Skeletal metastatic disease 2 is better 3 seen on the prior PET CT. Aortic Atherosclerosis (ICD10-I70.0) and Emphysema (ICD10-J43.9). Electronically Signed   By: Jasmine Pang M.D.   On: 09/24/2022 21:02   DG Chest 2 View  Result Date: 09/24/2022 CLINICAL DATA:  Shortness of breath, cough, and chest pain. EXAM: CHEST - 2 VIEW COMPARISON:  CT 08/19/2022 FINDINGS: Heart size and pulmonary vascularity are normal for technique. Developing infiltration in the left lung base. This suggest developing pneumonia or postobstructive change. The mass lesion seen in the left infrahilar region on prior  CT is not well demonstrated radiographically. No pleural effusions. No pneumothorax. Mediastinal contours appear intact. IMPRESSION: Increasing infiltration in the left lung base likely representing developing pneumonia or postobstructive change. Known left infrahilar mass is not well demonstrated radiographically. Electronically Signed   By: Burman Nieves M.D.    On: 09/24/2022 17:08    Pending Labs Unresulted Labs (From admission, onward)     Start     Ordered   09/25/22 0500  CBC  Tomorrow morning,   R        09/25/22 0403   09/25/22 0500  Basic metabolic panel  Tomorrow morning,   R        09/25/22 0403   09/25/22 0500  Magnesium  Tomorrow morning,   R        09/25/22 0403   09/25/22 0500  Phosphorus  Tomorrow morning,   R        09/25/22 0403   09/25/22 0359  Culture, Respiratory w Gram Stain (tracheal aspirate)  Once,   R        09/25/22 0403   09/25/22 0359  Procalcitonin  Once,   R       References:    Procalcitonin Lower Respiratory Tract Infection AND Sepsis Procalcitonin Algorithm   09/25/22 0403   09/25/22 0359  Strep pneumoniae urinary antigen (not at [facility])  Once,   R        09/25/22 0403   09/25/22 0359  Legionella Pneumophila Serogp 1 Ur Ag  Once,   R        09/25/22 0403   09/25/22 0327  Lactic acid, plasma  STAT Now then every 3 hours,   R (with STAT occurrences)      09/25/22 0326   09/24/22 2258  Expectorated Sputum Assessment w Gram Stain, Rflx to Resp Cult  ONCE - URGENT,   URGENT       Question:  Patient immune status  Answer:  Immunocompromised   09/24/22 2257   09/24/22 2257  Culture, blood (Routine X 2) w Reflex to ID Panel  BLOOD CULTURE X 2,   R (with TIMED occurrences)     Question:  Patient immune status  Answer:  Immunocompromised   09/24/22 2257            Vitals/Pain Today's Vitals   09/25/22 0610 09/25/22 0630 09/25/22 0640 09/25/22 0656  BP: 105/62 97/73 102/69 102/62  Pulse: 71 69 67 71  Resp: (!) 22 (!) 23 20 19   Temp:      TempSrc:      SpO2: 97% 98% 98% 97%  PainSc:        Isolation Precautions No active isolations  Medications Medications  acetaminophen (TYLENOL) tablet 650 mg (has no administration in time range)  prochlorperazine (COMPAZINE) injection 5 mg (has no administration in time range)  melatonin tablet 5 mg (has no administration in time range)  multivitamin with  minerals tablet 1 tablet (has no administration in time range)  0.9 %  sodium chloride infusion ( Intravenous New Bag/Given 09/24/22 2316)  midodrine (PROAMATINE) tablet 10 mg (has no administration in time range)  midodrine (PROAMATINE) tablet 10 mg (10 mg Oral Not Given 09/25/22 0334)  linezolid (ZYVOX) IVPB 600 mg (0 mg Intravenous Stopped 09/25/22 0508)  docusate sodium (COLACE) capsule 100 mg (has no administration in time range)  polyethylene glycol (MIRALAX / GLYCOLAX) packet 17 g (has no administration in time range)  heparin injection 5,000 Units (has no administration in time range)  0.9 %  sodium chloride infusion (250 mLs Intravenous Transfusing/Transfer 09/25/22 0406)  norepinephrine (LEVOPHED) 4mg  in (0.016 mg/mL) premix infusion (6 mcg/min Intravenous Rate/Dose Change 09/25/22 0531)  ceFEPIme (MAXIPIME) 2 g in sodium chloride 0.9 % 100 mL IVPB (0 g Intravenous Stopped 09/25/22 0512)  metroNIDAZOLE (FLAGYL) IVPB 500 mg (0 mg Intravenous Stopped 09/25/22 0616)  sodium phosphate 30 mmol in dextrose 5 % 250 mL infusion (30 mmol Intravenous New Bag/Given 09/25/22 0604)  iohexol (OMNIPAQUE) 350 MG/ML injection 75 mL (75 mLs Intravenous Contrast Given 09/24/22 2036)  sodium chloride 0.9 % bolus 500 mL (0 mLs Intravenous Stopped 09/24/22 2242)  midodrine (PROAMATINE) tablet 10 mg (10 mg Oral Given 09/24/22 2314)  midodrine (PROAMATINE) tablet 10 mg (10 mg Oral Given 09/25/22 0129)  sodium chloride 0.9 % bolus 500 mL (0 mLs Intravenous Stopped 09/25/22 0235)  sodium chloride 0.9 % bolus 1,000 mL (0 mLs Intravenous Stopped 09/25/22 0427)  magnesium sulfate IVPB 2 g 50 mL (0 g Intravenous Stopped 09/25/22 2952)    Mobility walks with person assist     Focused Assessments    R Recommendations: See Admitting Provider Note  Report given to:   Additional Notes:

## 2022-09-25 NOTE — Progress Notes (Signed)
Initial Nutrition Assessment  DOCUMENTATION CODES:  Not applicable  INTERVENTION:  Continue regular diet, change to ordering assistance Ensure Enlive po BID, each supplement provides 350 kcal and 20 grams of protein. 1 packet Juven BID, each packet provides 95 calories, 2.5 grams of protein (collagen), and 9.8 grams of carbohydrate (3 grams sugar); also contains 7 grams of L-arginine and L-glutamine, 300 mg vitamin C, 15 mg vitamin E, 1.2 mcg vitamin B-12, 9.5 mg zinc, 200 mg calcium, and 1.5 g  Calcium Beta-hydroxy-Beta-methylbutyrate to support wound healing MVI with minerals daily  NUTRITION DIAGNOSIS:   Increased nutrient needs related to wound healing as evidenced by estimated needs.  GOAL:   Patient will meet greater than or equal to 90% of their needs  MONITOR:   PO intake, Supplement acceptance, Skin, I & O's, Labs  REASON FOR ASSESSMENT:   Consult Assessment of nutrition requirement/status  ASSESSMENT:   Pt with hx of COPD, recently dx stage IV lung cancer, severe PVD with recent graft infection (wound vac in place), AAA, and HLD presented to ED with SOB. Found to be in shock and admitted to ICU.  Pt resting in bed at the time of assessment. States that generally his appetite is very good but that we are sending more food than he normally eats at home. States at breakfast he eats a bowl of cereal, has a sandwich for lunch, and then chicken and potato salad for dinner. Reports that his weight has been stable and that he ambulates independently at home.   Did discuss his increased needs for wound healing. Pt agreeable to incorporating ensure and juven to aid in wound healing.     Intake/Output Summary (Last 24 hours) at 09/25/2022 1504 Last data filed at 09/25/2022 1400 Gross per 24 hour  Intake 2492.36 ml  Output 575 ml  Net 1917.36 ml  Net IO Since Admission: 1,917.36 mL [09/25/22 1504]  Nutritionally Relevant Medications: Scheduled Meds:  multivitamin with  minerals  1 tablet Oral Daily   Continuous Infusions:  sodium chloride 75 mL/hr at 09/25/22 0900   ceFEPime (MAXIPIME) IV Stopped (09/25/22 0511)   linezolid (ZYVOX) IV Stopped (09/25/22 0505)   metronidazole Stopped (09/25/22 4098)   norepinephrine (LEVOPHED) Adult infusion 7 mcg/min (09/25/22 0900)   sodium phosphate 30 mmol in dextrose 5 % 250 mL infusion 43 mL/hr at 09/25/22 0900   Labs Reviewed: Na 129 BUN 31 Phosphorus 2.3 Mg 1.5  NUTRITION - FOCUSED PHYSICAL EXAM: Flowsheet Row Most Recent Value  Orbital Region No depletion  Upper Arm Region No depletion  Thoracic and Lumbar Region No depletion  Buccal Region No depletion  Temple Region No depletion  Clavicle Bone Region No depletion  Clavicle and Acromion Bone Region No depletion  Scapular Bone Region No depletion  Dorsal Hand No depletion  Patellar Region Mild depletion  Anterior Thigh Region Mild depletion  Posterior Calf Region No depletion  Edema (RD Assessment) None  Hair Reviewed  Eyes Reviewed  Mouth Reviewed  Skin Reviewed  Nails Reviewed    Diet Order:   Diet Order             Diet regular Room service appropriate? Yes; Fluid consistency: Thin  Diet effective now                   EDUCATION NEEDS:  Education needs have been addressed  Skin:  Skin Assessment: Reviewed RN Assessment Groin wound from grafting, 0.5 x 0.5 cm  Last BM:  7/16 - type  5  Height:  Ht Readings from Last 1 Encounters:  09/19/22 5\' 9"  (1.753 m)    Weight:  Wt Readings from Last 1 Encounters:  09/25/22 77.8 kg    Ideal Body Weight:  65.9 kg  BMI:  Body mass index is 25.33 kg/m.  Estimated Nutritional Needs:  Kcal:  1800-2000 kcal/d Protein:  90-110g/d Fluid:  >/=2L/d    Greig Castilla, RD, LDN Clinical Dietitian RD pager # available in AMION  After hours/weekend pager # available in [facility]

## 2022-09-25 NOTE — Progress Notes (Signed)
eLink Physician-Brief Progress Note Patient Name: Edward Campos DOB: [birth year] MRN: [id number]   Date of Service  09/25/2022  HPI/Events of Note  Notified of patient complaint of gas.  eICU Interventions  Simethicone ordered PRN.     Intervention Category Minor Interventions: Other:  Larinda Buttery 09/25/2022, 8:56 PM

## 2022-09-26 ENCOUNTER — Inpatient Hospital Stay (HOSPITAL_COMMUNITY): Payer: Medicare Other

## 2022-09-26 DIAGNOSIS — C3432 Malignant neoplasm of lower lobe, left bronchus or lung: Secondary | ICD-10-CM

## 2022-09-26 DIAGNOSIS — R579 Shock, unspecified: Secondary | ICD-10-CM | POA: Diagnosis not present

## 2022-09-26 DIAGNOSIS — I739 Peripheral vascular disease, unspecified: Secondary | ICD-10-CM | POA: Diagnosis not present

## 2022-09-26 LAB — CBC
HCT: 32.7 % — ABNORMAL LOW (ref 39.0–52.0)
Hemoglobin: 11.3 g/dL — ABNORMAL LOW (ref 13.0–17.0)
MCH: 30.3 pg (ref 26.0–34.0)
MCHC: 34.6 g/dL (ref 30.0–36.0)
MCV: 87.7 fL (ref 80.0–100.0)
Platelets: 184 10*3/uL (ref 150–400)
RBC: 3.73 MIL/uL — ABNORMAL LOW (ref 4.22–5.81)
RDW: 13.9 % (ref 11.5–15.5)
WBC: 7.1 10*3/uL (ref 4.0–10.5)
nRBC: 0 % (ref 0.0–0.2)

## 2022-09-26 LAB — BASIC METABOLIC PANEL
Anion gap: 6 (ref 5–15)
BUN: 18 mg/dL (ref 8–23)
CO2: 24 mmol/L (ref 22–32)
Calcium: 7.5 mg/dL — ABNORMAL LOW (ref 8.9–10.3)
Chloride: 101 mmol/L (ref 98–111)
Creatinine, Ser: 1.01 mg/dL (ref 0.61–1.24)
GFR, Estimated: >60 mL/min (ref >60)
Glucose, Bld: 117 mg/dL — ABNORMAL HIGH (ref 70–99)
Potassium: 3.5 mmol/L (ref 3.5–5.1)
Sodium: 131 mmol/L — ABNORMAL LOW (ref 135–145)

## 2022-09-26 LAB — MAGNESIUM: Magnesium: 2.2 mg/dL (ref 1.7–2.4)

## 2022-09-26 MED ORDER — MIDODRINE HCL 5 MG PO TABS: 5.0000 mg | ORAL_TABLET | Freq: Three times a day (TID) | ORAL | Status: DC

## 2022-09-26 MED ORDER — MAGNESIUM SULFATE 4 GM/100ML IV SOLN
4.0000 g | Freq: Once | INTRAVENOUS | Status: AC
  Administered 2022-09-26: 4 g via INTRAVENOUS
  Filled 2022-09-26: qty 100

## 2022-09-26 MED ORDER — MIDODRINE HCL 5 MG PO TABS
5.0000 mg | ORAL_TABLET | Freq: Three times a day (TID) | ORAL | Status: AC
  Administered 2022-09-27: 5 mg via ORAL
  Filled 2022-09-26: qty 1

## 2022-09-26 MED ORDER — ASPIRIN 81 MG PO TBEC
81.0000 mg | DELAYED_RELEASE_TABLET | Freq: Every day | ORAL | Status: DC
  Administered 2022-09-26 – 2022-09-28 (×3): 81 mg via ORAL
  Filled 2022-09-26 (×3): qty 1

## 2022-09-26 MED ORDER — ROSUVASTATIN CALCIUM 20 MG PO TABS
20.0000 mg | ORAL_TABLET | Freq: Every day | ORAL | Status: DC
  Administered 2022-09-26 – 2022-09-28 (×3): 20 mg via ORAL
  Filled 2022-09-26 (×3): qty 1

## 2022-09-26 NOTE — Progress Notes (Signed)
NAME:  Edward Campos, MRN:  [id number], DOB:  1932/12/03, LOS: 2 ADMISSION DATE:  09/24/2022, CONSULTATION DATE:  09/25/2022 REFERRING MD:  Dr Donella Stade, CHIEF COMPLAINT:  hypotension, PNA   History of Present Illness:  89 year old man who presented to [facility] 7/15 for SOB and cough. PMHx significant for SCLC with LLL mass (diagnosed 08/2022), HLD, PVD (s/p fem-fem bypass grafting), AAA, BPH, anxiety Recent admission to [facility] 6/6 - 6/14 for fem-fem bypass grafting c/b infection requiring washout, VAC placement and broad-spectrum antibiotics.   Per chart review, patient's daughter initially contacted [facility] office 7/15AM reporting that patient was SOB with cough productive of white sputum, diaphoretic and pale for about one week PTA, worsened over the few days. Advised to present to UC and eventually came to ED for evaluation. On ED arrival, patient reported two episodes of central chest pain (~5 min/episode) and lower blood pressures than usual. Afebrile, HR 71, RR 24, BP 84/52, SpO2 100%. Labs were notable for WBC 10.8 Hgb 12.9, Plt 213. Na 131, K 4.0, CO2 22, Cr 1.36 (baseline ~1.1). Trop 13 > 12. LA 1.1, PCT 0.61. CXR with increasing infiltrates L lung base, postobstructive PNA. CTA Chest negative for PE, +L lung mass c/w known malignancy, increased irregular airspace disease (LLL) c/w postobstructive PNA and skeletal metastases. Despite fluid resuscitation and midodrine, patient remained hypotensive and peripheral vasopressors were initiated.   PCCM consulted for ICU admission in the setting of postobstructive PNA and vasopressor initiation.  Pertinent  Medical History   Past Medical History:  Diagnosis Date   AAA (abdominal aortic aneurysm) (HCC)    Anxiety    Arthritis    BPH (benign prostatic hyperplasia)    Dizziness    Hyperlipidemia    PVD (peripheral vascular disease) (HCC)    S/p fem-fem bypass grafting 08/2022   SCLC (small cell lung carcinoma), left (HCC)    Significant  Hospital Events: Including procedures, antibiotic start and stop dates in addition to other pertinent events   7/15 - Admitted by [facility] to stepdown for PNA, persistently hypotensive requiring pressors  midodine, PCCM consulted for ICU admission for pressors.  Interim History / Subjective:  Still requiring vasopressors No other significant overnight events  Objective   Blood pressure 107/69, pulse (!) 54, temperature 98 F (36.7 C), temperature source Axillary, resp. rate (!) 21, weight 80 kg, SpO2 95%.        Intake/Output Summary (Last 24 hours) at 09/26/2022 0804 Last data filed at 09/26/2022 0600 Gross per 24 hour  Intake 2340.73 ml  Output 750 ml  Net 1590.73 ml   Filed Weights   09/25/22 0730 09/26/22 0500  Weight: 77.8 kg 80 kg    Examination: General: Elderly, pleasant, in no acute distress HENT: Moist oral mucosa Lungs: Decreased air entry on the left Cardiovascular: S1-S2 appreciated Abdomen: Soft, bowel sounds appreciated Extremities: Bilateral lower extremity edema Neuro: Alert and oriented GU:   Chest x-ray 717 reviewed by myself -Left lower lobe infiltrate, pulmonary vascular congestion Recent CT scan of the chest reviewed by myself-09/24/2022   Off pressors for the last 30 minutes at 8:42 AM  Resolved Hospital Problem list     Assessment & Plan:   Undifferentiated shock -Likely secondary to postobstructive pneumonia -Still requiring pressors -Levophed to maintain MAP goal of greater than 65 -Continue Solu-Cortef -MRSA PCR not detected -Will de-escalate antibiotics discontinue Flagyl and Zyvox -Remains afebrile, no significant leukocytosis  Postobstructive pneumonia Left lower lung mass Small cell lung cancer with bony metastasis -Will  continue oxygen supplementation Cultures have been negative to date  Severe peripheral arterial disease S/p femorofemoral bypass -Continue wound management  Hyperlipidemia AAA -Continue cardiac  monitoring   Best Practice (right click and "Reselect all SmartList Selections" daily)   Diet/type: Regular consistency (see orders) DVT prophylaxis: prophylactic heparin  GI prophylaxis: N/A Lines: N/A Foley:  N/A Code Status:  full code Last date of multidisciplinary goals of care discussion [pending]  Labs   CBC: Recent Labs  Lab 09/24/22 1640 09/25/22 0337  WBC 10.8* 8.9  NEUTROABS  --  6.8  HGB 12.9* 11.1*  HCT 37.1* 31.6*  MCV 88.5 87.3  PLT 213 177    Basic Metabolic Panel: Recent Labs  Lab 09/24/22 1640 09/25/22 0337  NA 131* 129*  K 4.0 4.0  CL 95* 103  CO2 22 19*  GLUCOSE 99 103*  BUN 38* 31*  CREATININE 1.36* 1.11  CALCIUM 8.7* 7.5*  MG  --  1.5*  PHOS  --  2.3*   GFR: Estimated Creatinine Clearance: 46.9 mL/min (by C-G formula based on SCr of 1.11 mg/dL). Recent Labs  Lab 09/24/22 1640 09/25/22 0337 09/25/22 0654  PROCALCITON  --  0.61  --   WBC 10.8* 8.9  --   LATICACIDVEN  --  1.1 1.3    Liver Function Tests: Recent Labs  Lab 09/25/22 0337  AST 26  ALT 29  ALKPHOS 53  BILITOT 1.6*  PROT 4.6*  ALBUMIN 2.5*   No results for input(s): "LIPASE", "AMYLASE" in the last 168 hours. No results for input(s): "AMMONIA" in the last 168 hours.  ABG    Component Value Date/Time   TCO2 25 07/06/2019 0919     Coagulation Profile: No results for input(s): "INR", "PROTIME" in the last 168 hours.  Cardiac Enzymes: No results for input(s): "CKTOTAL", "CKMB", "CKMBINDEX", "TROPONINI" in the last 168 hours.  HbA1C: Hgb A1c MFr Bld  Date/Time Value Ref Range Status  05/29/2019 02:50 PM 5.0 4.8 - 5.6 % Final    Comment:             Prediabetes: 5.7 - 6.4          Diabetes: >6.4          Glycemic control for adults with diabetes: <7.0     CBG: Recent Labs  Lab 09/25/22 0758  GLUCAP 119*    Review of Systems:   Awake alert and interactive Denies any pain, feeling better overall  Past Medical History:  He,  has a past medical  history of AAA (abdominal aortic aneurysm) (HCC), Anxiety, Arthritis, BPH (benign prostatic hyperplasia), Dizziness, Hyperlipidemia, PVD (peripheral vascular disease) (HCC), and SCLC (small cell lung carcinoma), left (HCC).   Surgical History:   Past Surgical History:  Procedure Laterality Date   ABDOMINAL AORTOGRAM W/LOWER EXTREMITY N/A 05/06/2019   Procedure: ABDOMINAL AORTOGRAM W/LOWER EXTREMITY;  Surgeon: Cephus Shelling, MD;  Location: [facility] INVASIVE CV LAB;  Service: Cardiovascular;  Laterality: N/A;   ABDOMINAL WOUND DEHISCENCE N/A 08/22/2022   Procedure: WASHOUT ABDOMEN WOUND AND VAC CHANGE;  Surgeon: Cephus Shelling, MD;  Location: [facility] OR;  Service: Vascular;  Laterality: N/A;   APPLICATION OF WOUND VAC  07/06/2019   Procedure: Application Of Wound Vac Left Groin;  Surgeon: Sherren Kerns, MD;  Location: [facility] OR;  Service: Vascular;;   APPLICATION OF WOUND VAC N/A 08/16/2022   Procedure: APPLICATION OF WOUND VAC;  Surgeon: [name], MD;  Location: [facility] OR;  Service: Vascular;  Laterality: N/A;  BRONCHIAL BIOPSY  08/21/2022   Procedure: BRONCHIAL BIOPSIES;  Surgeon: Oretha Milch, MD;  Location: [facility] ENDOSCOPY;  Service: Cardiopulmonary;;   BRONCHIAL BRUSHINGS  08/21/2022   Procedure: BRONCHIAL BRUSHINGS;  Surgeon: Oretha Milch, MD;  Location: [facility] ENDOSCOPY;  Service: Cardiopulmonary;;   BRONCHIAL WASHINGS  08/21/2022   Procedure: BRONCHIAL WASHINGS;  Surgeon: Oretha Milch, MD;  Location: [facility] ENDOSCOPY;  Service: Cardiopulmonary;;   CARDIOVERSION N/A 11/03/2020   Procedure: CARDIOVERSION;  Surgeon: Thurmon Fair, MD;  Location: [facility] ENDOSCOPY;  Service: Cardiovascular;  Laterality: N/A;   ENDARTERECTOMY FEMORAL Bilateral 06/08/2019   Procedure: ENDARTERECTOMY RIGHT COMMON FEMORAL; ENDARTERECTOMY RIGHT EXTERNAL ILIAC; ENDARTERECTOMY LEFT COMMON FEMORAL; ENDARTERECTOMY LEFT EXTERNAL ILIAC;  Surgeon: Sherren Kerns, MD;  Location: [facility] OR;  Service: Vascular;  Laterality:  Bilateral;   FEMORAL-FEMORAL BYPASS GRAFT Bilateral 06/08/2019   Procedure: BYPASS GRAFT FEMORAL-FEMORAL ARTERY;  Surgeon: Sherren Kerns, MD;  Location: [facility] OR;  Service: Vascular;  Laterality: Bilateral;   GROIN DEBRIDEMENT Left 07/06/2019   Procedure: INCISION AND DRAINAGE OF LEFT GROIN;  Surgeon: Sherren Kerns, MD;  Location: [facility] OR;  Service: Vascular;  Laterality: Left;   growth removed from chest     HEMORRHOID SURGERY     HEMOSTASIS CONTROL  08/21/2022   Procedure: HEMOSTASIS CONTROL;  Surgeon: Oretha Milch, MD;  Location: [facility] ENDOSCOPY;  Service: Cardiopulmonary;;   HERNIA REPAIR  12 yrs ago   left inguinal hernia   INCISE AND DRAIN ABCESS Left 07/06/2019   Incision and drainage left groin placement of VAC dressing   INCISION AND DRAINAGE OF WOUND Right 08/16/2022   Procedure: IRRIGATION AND DEBRIDEMENT WOUND;  Surgeon: [name], MD;  Location: [facility] OR;  Service: Vascular;  Laterality: Right;   INGUINAL HERNIA REPAIR  10/16/2010   Procedure: HERNIA REPAIR INGUINAL ADULT;  Surgeon: Dalia Heading;  Location: [facility] ORS;  Service: General;  Laterality: Right;   INSERTION OF ILIAC STENT Right 06/08/2019   Procedure: INSERTION OF RIGHT COMMON ILIAC AND EXTERNAL ILICAC STENT;  Surgeon: Sherren Kerns, MD;  Location: [facility] OR;  Service: Vascular;  Laterality: Right;   PATCH ANGIOPLASTY Right 06/08/2019   Procedure: Patch Angioplasty Right Common Femoral;  Surgeon: Sherren Kerns, MD;  Location: [facility] OR;  Service: Vascular;  Laterality: Right;   VIDEO BRONCHOSCOPY N/A 08/21/2022   Procedure: VIDEO BRONCHOSCOPY WITH FLUORO;  Surgeon: Oretha Milch, MD;  Location: [facility] ENDOSCOPY;  Service: Cardiopulmonary;  Laterality: N/A;     Social History:   reports that he quit smoking about 3 years ago. His smoking use included cigarettes. He started smoking about 73 years ago. He has a 105 pack-year smoking history. He has never used smokeless tobacco. He reports current alcohol use of about  3.0 standard drinks of alcohol per week. He reports that he does not use drugs.   Family History:  His family history includes Stroke in his mother. There is no history of Pseudochol deficiency, Malignant hyperthermia, Hypotension, or Anesthesia problems.   Allergies Allergies  Allergen Reactions   Penicillins Other (See Comments)    The patient is critically ill with multiple organ systems failure and requires high complexity decision making for assessment and support, frequent evaluation and titration of therapies, application of advanced monitoring technologies and extensive interpretation of multiple databases. Critical Care Time devoted to patient care services described in this note independent of APP/resident time (if applicable)  is 32 minutes.   Virl Diamond MD [facility] Personal pager:  See Amion If unanswered, please page CCM On-call: #[phone]

## 2022-09-26 NOTE — Evaluation (Signed)
Occupational Therapy Evaluation Patient Details Name: Edward Campos MRN: [id number] DOB: Apr 12, 1932 Today's Date: 09/26/2022   History of Present Illness 89 yo male admitted 7/15 with cough, hypoxia and obstructive PNA. Hypotensive requiring vasopressors. PMhx: HTN, AAA, anxiety, recently diagnosed (June 2024 ) left lower lung mass/small cell lung cancer, PVD s/p femoral-femoral BPG complicated by infection s/p I&D with wound VAC placement to R groin,   Clinical Impression   Pt evaluated s/p above admission list. Pt reports independence with ADL/IADLs, driving and functional mobility without use of AD at baseline. Pt presents this session with decreased balance and reports of blurred vision onset during mobility with BP stable. Pt reports vision returned to baseline upon sitting with visual tracking and peripheral vision WFL, will further assess. Pt currently requires setup A for seated UB ADLs and min guard A for LB ADLs. Pt completed STS transfer with supervision and no AD. Pt completed room/hallway level mobility with min guard A as pt occasionally bumping into objects on the R side. Pt would benefit from continued acute OT services to maximize functional independence and facilitate transition to pt's natural home environment. Pt with no follow up OT needs upon discharge.   Session vitals: BP sitting: 137/64, BP standing: 129/60, BP post activity: 163/65, SpO2 >95% on RA throughout     Recommendations for follow up therapy are one component of a multi-disciplinary discharge planning process, led by the attending physician.  Recommendations may be updated based on patient status, additional functional criteria and insurance authorization.   Assistance Recommended at Discharge PRN  Patient can return home with the following Assistance with cooking/housework    Functional Status Assessment  Patient has had a recent decline in their functional status and demonstrates the ability to make  significant improvements in function in a reasonable and predictable amount of time.  Equipment Recommendations  None recommended by OT    Recommendations for Other Services       Precautions / Restrictions Precautions Precautions: Fall Restrictions Weight Bearing Restrictions: No      Mobility Bed Mobility               General bed mobility comments: Not assessed, pt in chair upon arrival    Transfers Overall transfer level: Needs assistance Equipment used: None Transfers: Sit to/from Stand Sit to Stand: Supervision           General transfer comment: STS transfer from recliner with supervision and no AD. Room/hallway level mobility without AD and min guard A. Pt with report of blurred vision during mobility and occasionally bumping into objects on R side.      Balance Overall balance assessment: Mild deficits observed, not formally tested                                         ADL either performed or assessed with clinical judgement   ADL Overall ADL's : Needs assistance/impaired Eating/Feeding: Independent;Sitting   Grooming: Set up;Sitting   Upper Body Bathing: Set up;Sitting   Lower Body Bathing: Min guard;Sit to/from stand   Upper Body Dressing : Set up;Sitting   Lower Body Dressing: Min guard;Sit to/from stand   Toilet Transfer: Min guard;Ambulation;Regular Teacher, adult education Details (indicate cue type and reason): simulated Toileting- Clothing Manipulation and Hygiene: Min guard;Sit to/from stand       Functional mobility during ADLs: Min guard General ADL Comments: limited secondary to  reports of blurred vision with mobility occasionally bumping into objects on R side.     Vision Baseline Vision/History: 1 Wears glasses (readers) Ability to See in Adequate Light: 0 Adequate Patient Visual Report: Blurring of vision Vision Assessment?: No apparent visual deficits Additional Comments: Pt with report of blurred  vision during mobility, back to baseline upon sitting in chair, BP stable. Visual tracking and peripheral vision WFL. Will continue to assess.     Perception Perception Perception Tested?: No   Praxis Praxis Praxis tested?: Not tested    Pertinent Vitals/Pain Pain Assessment Pain Assessment: No/denies pain Pain Intervention(s): Monitored during session     Hand Dominance Right   Extremity/Trunk Assessment Upper Extremity Assessment Upper Extremity Assessment: Overall WFL for tasks assessed   Lower Extremity Assessment Lower Extremity Assessment: Defer to PT evaluation   Cervical / Trunk Assessment Cervical / Trunk Assessment: Kyphotic   Communication Communication Communication: No difficulties   Cognition Arousal/Alertness: Awake/alert Behavior During Therapy: WFL for tasks assessed/performed Overall Cognitive Status: Within Functional Limits for tasks assessed                                 General Comments: Pleasant and cooperative, follows commands appropriately     General Comments  BP sitting: 137/64, BP standing: 129/60, BP post activity: 163/65, SpO2 >95% on RA throughout    Exercises     Shoulder Instructions      Home Living Family/patient expects to be discharged to:: Private residence Living Arrangements: Alone Available Help at Discharge: Other (Comment) (limited) Type of Home: House Home Access: Stairs to enter [employer] of Steps: 8 steps from garage Entrance Stairs-Rails: Right;Left Home Layout: Multi-level Alternate Level Stairs-Number of Steps: Split Level - 6 to bedroom Alternate Level Stairs-Rails: Right Bathroom Shower/Tub: Tub/shower unit;Walk-in shower   Bathroom Toilet: Standard     Home Equipment: BSC/3in1          Prior Functioning/Environment Prior Level of Function : Independent/Modified Independent;Driving             Mobility Comments: Independent community ambulation ADLs Comments:  Independent with all adls and iadls, driving, does yard work        OT Problem List: Decreased strength;Decreased activity tolerance;Impaired balance (sitting and/or standing);Impaired vision/perception      OT Treatment/Interventions: Self-care/ADL training;Therapeutic exercise;Energy conservation;DME and/or AE instruction;Therapeutic activities;Patient/family education;Visual/perceptual remediation/compensation;Balance training    OT Goals(Current goals can be found in the care plan section) Acute Rehab OT Goals Patient Stated Goal: to go home OT Goal Formulation: With patient Time For Goal Achievement: 10/10/22 Potential to Achieve Goals: Good ADL Goals Pt Will Perform Grooming: with modified independence;standing Pt Will Perform Lower Body Dressing: with modified independence;sit to/from stand Pt Will Transfer to Toilet: with modified independence;ambulating;regular height toilet Pt Will Perform Toileting - Clothing Manipulation and hygiene: with modified independence;sit to/from stand  OT Frequency: Min 2X/week    Co-evaluation PT/OT/SLP Co-Evaluation/Treatment: Yes Reason for Co-Treatment: Complexity of the patient's impairments (multi-system involvement);For patient/therapist safety;To address functional/ADL transfers   OT goals addressed during session: ADL's and self-care      AM-PAC OT "6 Clicks" Daily Activity     Outcome Measure Help from another person eating meals?: None Help from another person taking care of personal grooming?: A Little Help from another person toileting, which includes using toliet, bedpan, or urinal?: A Little Help from another person bathing (including washing, rinsing, drying)?: A Little Help from another  person to put on and taking off regular upper body clothing?: A Little Help from another person to put on and taking off regular lower body clothing?: A Little 6 Click Score: 19   End of Session Equipment Utilized During Treatment: Other  (comment) (none) Nurse Communication: Mobility status  Activity Tolerance: Patient tolerated treatment well Patient left: in chair;with call bell/phone within reach;with chair alarm set  OT Visit Diagnosis: Unsteadiness on feet (R26.81);Muscle weakness (generalized) (M62.81)                Time: 4782-9562 OT Time Calculation (min): 19 min Charges:  OT General Charges $OT Visit: 1 Visit OT Evaluation $OT Eval Moderate Complexity: 1 Mod  Sherley Bounds, OTS Acute Rehabilitation Services Office [phone] Secure Chat Communication Preferred   Sherley Bounds 09/26/2022, 11:39 AM

## 2022-09-26 NOTE — Progress Notes (Signed)
[facility] ADULT ICU REPLACEMENT PROTOCOL   The patient does apply for the [facility] Adult ICU Electrolyte Replacment Protocol based on the criteria listed below:   1.Exclusion criteria: TCTS, ECMO, Dialysis, and Myasthenia Gravis patients 2. Is GFR >/= 30 ml/min? Yes.    Patient's GFR today is >60 3. Is SCr </= 2? Yes.   Patient's SCr is 1.11 mg/dL 4. Did SCr increase >/= 0.5 in 24 hours? No. 5.Pt's weight >40kg  Yes.   6. Abnormal electrolyte(s): mag 1.5  7. Electrolytes replaced per protocol 8.  Call MD STAT for K+ </= 2.5, Phos </= 1, or Mag </= 1 Physician:  Reviewed PMH prior to ordering replacement  [facility], Edward Campos 09/26/2022 4:51 AM

## 2022-09-26 NOTE — Evaluation (Signed)
Physical Therapy Evaluation Patient Details Name: Edward Campos MRN: [id number] DOB: 31-Dec-1932 Today's Date: 09/26/2022  History of Present Illness  89 yo male admitted 7/15 with cough, hypoxia and obstructive PNA. Hypotensive requiring vasopressors. PMhx: HTN, AAA, anxiety, recently diagnosed (June 2024 ) left lower lung mass/small cell lung cancer, PVD s/p femoral-femoral BPG complicated by infection s/p I&D with wound VAC placement to R groin,  Clinical Impression  Pt admitted with above diagnosis. At baseline, pt lives alone and independent.  Today, pt ambulating 400' without AD.  He did require min guard and would drift to the right with challenges.  Pt had c/o blurry vision with walking - all VSS, visual fields intact, and EOEM coordinated intact.  He contributed it to the lighting in hallway as it improved in his room.  Pt expected to progress well and no therapy needs at d/c.  Pt currently with functional limitations due to the deficits listed below (see PT Problem List). Pt will benefit from acute skilled PT to increase their independence and safety with mobility to allow discharge.           Assistance Recommended at Discharge PRN  If plan is discharge home, recommend the following:  Can travel by private vehicle           Equipment Recommendations Rolling walker (2 wheels)  Recommendations for Other Services       Functional Status Assessment Patient has had a recent decline in their functional status and demonstrates the ability to make significant improvements in function in a reasonable and predictable amount of time.     Precautions / Restrictions Precautions Precautions: Fall Restrictions Weight Bearing Restrictions: No      Mobility  Bed Mobility Overal bed mobility: Needs Assistance             General bed mobility comments: Not assessed, pt in chair upon arrival    Transfers Overall transfer level: Needs assistance Equipment used: None Transfers:  Sit to/from Stand Sit to Stand: Supervision           General transfer comment: STS from recliner with supervision for lines    Ambulation/Gait Ambulation/Gait assistance: Min guard Gait Distance (Feet): 400 Feet Assistive device: None Gait Pattern/deviations: Step-through pattern, Drifts right/left Gait velocity: normal     General Gait Details: Pt ambulating 2 laps around ICU unit.  He did drift to the right and would reach out to stabilize when he neared the wall or item- mainly when doing head turns.  Cued to avoid objects.  On 2nd lap reported blurry vision (VSS but did have some shortness of breath, see comments)  Stairs            Wheelchair Mobility     Tilt Bed    Modified Rankin (Stroke Patients Only)       Balance Overall balance assessment: Needs assistance Sitting-balance support: No upper extremity supported Sitting balance-Leahy Scale: Normal     Standing balance support: No upper extremity supported Standing balance-Leahy Scale: Fair Standing balance comment: Pt able to ambulate without LOB.  When doing head turns he would drift to the right and bumb into/reach out to stabilize on items.                             Pertinent Vitals/Pain Pain Assessment Pain Assessment: No/denies pain    Home Living Family/patient expects to be discharged to:: Private residence Living Arrangements: Alone Available Help at Discharge:  Other (Comment) (limited) Type of Home: House Home Access: Stairs to enter Entrance Stairs-Rails: Doctor, general practice of Steps: 8 steps from garage Alternate Level Stairs-Number of Steps: Split Level - 6 to bedroom Home Layout: Multi-level Home Equipment: BSC/3in1      Prior Function Prior Level of Function : Independent/Modified Independent;Driving             Mobility Comments: Independent community ambulation ADLs Comments: Independent with all adls and iadls, driving, does yard work      Higher education careers adviser   Dominant Hand: Right    Extremity/Trunk Assessment   Upper Extremity Assessment Upper Extremity Assessment: Overall WFL for tasks assessed    Lower Extremity Assessment Lower Extremity Assessment: Overall WFL for tasks assessed    Cervical / Trunk Assessment Cervical / Trunk Assessment: Kyphotic (mild)  Communication   Communication: No difficulties  Cognition Arousal/Alertness: Awake/alert Behavior During Therapy: WFL for tasks assessed/performed Overall Cognitive Status: Within Functional Limits for tasks assessed                                 General Comments: Pleasant and cooperative, follows commands appropriately        General Comments General comments (skin integrity, edema, etc.): BP sitting 137/64, BP standing 129/60.  He had some c/o blurry vission with walking - BP was 163/65 and SpO2 >95% on RA , and HR 58-70's throughout evaluation.   Pt reports vision blurry when walking but states resolved in chair, he felt it may be due to lighting in hallway.  All vitals were stable. Checked visual fields and smooth pursuit - all intact and equal bilaterally.    Exercises     Assessment/Plan    PT Assessment Patient needs continued PT services  PT Problem List Decreased knowledge of use of DME;Decreased safety awareness;Decreased balance;Decreased mobility;Cardiopulmonary status limiting activity;Decreased activity tolerance       PT Treatment Interventions DME instruction;Balance training;Gait training;Stair training;Functional mobility training;Therapeutic activities;Therapeutic exercise;Patient/family education;Neuromuscular re-education    PT Goals (Current goals can be found in the Care Plan section)  Acute Rehab PT Goals Patient Stated Goal: return home PT Goal Formulation: With patient Time For Goal Achievement: 10/10/22 Potential to Achieve Goals: Good Additional Goals Additional Goal #1: Pt will score >19 on DGI to  indicate low fall risk    Frequency Min 3X/week     Co-evaluation PT/OT/SLP Co-Evaluation/Treatment: Yes Reason for Co-Treatment: Complexity of the patient's impairments (multi-system involvement);For patient/therapist safety;To address functional/ADL transfers (ICU) PT goals addressed during session: Mobility/safety with mobility;Balance OT goals addressed during session: ADL's and self-care       AM-PAC PT "6 Clicks" Mobility  Outcome Measure Help needed turning from your back to your side while in a flat bed without using bedrails?: None Help needed moving from lying on your back to sitting on the side of a flat bed without using bedrails?: None Help needed moving to and from a bed to a chair (including a wheelchair)?: A Little Help needed standing up from a chair using your arms (e.g., wheelchair or bedside chair)?: A Little Help needed to walk in hospital room?: A Little Help needed climbing 3-5 steps with a railing? : A Little 6 Click Score: 20    End of Session Equipment Utilized During Treatment: Gait belt Activity Tolerance: Patient tolerated treatment well Patient left: in chair;with call bell/phone within reach;with chair alarm set Nurse Communication: Mobility status PT Visit Diagnosis: Other  abnormalities of gait and mobility (R26.89)    Time: 5784-6962 PT Time Calculation (min) (ACUTE ONLY): 21 min   Charges:   PT Evaluation $PT Eval Low Complexity: 1 Low   PT General Charges $$ ACUTE PT VISIT: 1 Visit         Anise Salvo, PT Acute Rehab [facility] [phone]   Rayetta Humphrey 09/26/2022, 12:01 PM

## 2022-09-27 ENCOUNTER — Inpatient Hospital Stay: Payer: Medicare Other | Admitting: Hematology

## 2022-09-27 DIAGNOSIS — I739 Peripheral vascular disease, unspecified: Secondary | ICD-10-CM | POA: Diagnosis not present

## 2022-09-27 DIAGNOSIS — R579 Shock, unspecified: Secondary | ICD-10-CM | POA: Diagnosis not present

## 2022-09-27 DIAGNOSIS — J189 Pneumonia, unspecified organism: Secondary | ICD-10-CM | POA: Diagnosis not present

## 2022-09-27 DIAGNOSIS — C3432 Malignant neoplasm of lower lobe, left bronchus or lung: Secondary | ICD-10-CM | POA: Diagnosis not present

## 2022-09-27 LAB — CBC
HCT: 31.4 % — ABNORMAL LOW (ref 39.0–52.0)
Hemoglobin: 11 g/dL — ABNORMAL LOW (ref 13.0–17.0)
MCH: 30.1 pg (ref 26.0–34.0)
MCHC: 35 g/dL (ref 30.0–36.0)
MCV: 85.8 fL (ref 80.0–100.0)
Platelets: 181 10*3/uL (ref 150–400)
RBC: 3.66 MIL/uL — ABNORMAL LOW (ref 4.22–5.81)
RDW: 13.7 % (ref 11.5–15.5)
WBC: 6.9 10*3/uL (ref 4.0–10.5)
nRBC: 0 % (ref 0.0–0.2)

## 2022-09-27 LAB — BASIC METABOLIC PANEL
Anion gap: 4 — ABNORMAL LOW (ref 5–15)
BUN: 18 mg/dL (ref 8–23)
CO2: 22 mmol/L (ref 22–32)
Calcium: 7.5 mg/dL — ABNORMAL LOW (ref 8.9–10.3)
Chloride: 102 mmol/L (ref 98–111)
Creatinine, Ser: 0.95 mg/dL (ref 0.61–1.24)
GFR, Estimated: >60 mL/min (ref >60)
Glucose, Bld: 109 mg/dL — ABNORMAL HIGH (ref 70–99)
Potassium: 4 mmol/L (ref 3.5–5.1)
Sodium: 128 mmol/L — ABNORMAL LOW (ref 135–145)

## 2022-09-27 LAB — MAGNESIUM: Magnesium: 2 mg/dL (ref 1.7–2.4)

## 2022-09-27 NOTE — Plan of Care (Signed)

## 2022-09-27 NOTE — Care Management Important Message (Signed)
Important Message  Patient Details  Name: Edward Campos MRN: [id number] Date of Birth: May 07, 1932   Medicare Important Message Given:  Yes     Aashir Umholtz 09/27/2022, 2:25 PM

## 2022-09-27 NOTE — Progress Notes (Signed)
Occupational Therapy Treatment Patient Details Name: Edward Campos MRN: [id number] DOB: [birth year] Today's Date: 09/27/2022   History of present illness 89 yo male admitted 7/15 with cough, hypoxia and obstructive PNA. Hypotensive requiring vasopressors. PMhx: HTN, AAA, anxiety, recently diagnosed (June 2024 ) left lower lung mass/small cell lung cancer, PVD s/p femoral-femoral BPG complicated by infection s/p I&D with wound VAC placement to R groin,   OT comments  Pt making good progress with functional goals. Pt walks to bathroom, transfers to toilet and shower min guard A - Sup using grab bars, no AD, intermittent HHA. Pt eager to be up and moving around with therapy. Educated pt on use of raised toilet seat and shower chair for home use safety/fall prevention. OT will continue to follow acutely to maximize level of function and safety   Recommendations for follow up therapy are one component of a multi-disciplinary discharge planning process, led by the attending physician.  Recommendations may be updated based on patient status, additional functional criteria and insurance authorization.    Assistance Recommended at Discharge PRN  Patient can return home with the following  Assistance with cooking/housework   Equipment Recommendations  None recommended by OT    Recommendations for Other Services      Precautions / Restrictions Precautions Precautions: Fall Restrictions Weight Bearing Restrictions: No       Mobility Bed Mobility               General bed mobility comments: pt in recliner upon arrival    Transfers   Equipment used: 1 person hand held assist, None Transfers: Sit to/from Stand Sit to Stand: Supervision                 Balance Overall balance assessment: Needs assistance Sitting-balance support: No upper extremity supported Sitting balance-Leahy Scale: Good     Standing balance support: No upper extremity supported, During functional  activity Standing balance-Leahy Scale: Fair                             ADL either performed or assessed with clinical judgement   ADL Overall ADL's : Needs assistance/impaired     Grooming: Wash/dry hands;Wash/dry face;Supervision/safety;Standing               Lower Body Dressing: Min guard;Set up;Sit to/from stand   Toilet Transfer: Min guard;Ambulation;Supervision/safety;Regular Toilet;Grab bars   Toileting- Clothing Manipulation and Hygiene: Supervision/safety;Sit to/from stand   Tub/ Shower Transfer: Min guard;Ambulation;Supervision/safety;Grab bars   Functional mobility during ADLs: Min guard;Supervision/safety      Extremity/Trunk Assessment Upper Extremity Assessment Upper Extremity Assessment: Overall WFL for tasks assessed   Lower Extremity Assessment Lower Extremity Assessment: Defer to PT evaluation   Cervical / Trunk Assessment Cervical / Trunk Assessment: Kyphotic    Vision Baseline Vision/History: 1 Wears glasses Ability to See in Adequate Light: 0 Adequate Patient Visual Report: Blurring of vision     Perception     Praxis      Cognition Arousal/Alertness: Awake/alert Behavior During Therapy: WFL for tasks assessed/performed Overall Cognitive Status: Within Functional Limits for tasks assessed                                 General Comments: Pleasant and cooperative, follows commands appropriately        Exercises      Shoulder Instructions       General  Comments      Pertinent Vitals/ Pain       Pain Assessment Pain Assessment: No/denies pain  Home Living                                          Prior Functioning/Environment              Frequency  Min 2X/week        Progress Toward Goals  OT Goals(current goals can now be found in the care plan section)  Progress towards OT goals: Progressing toward goals     Plan Discharge plan remains appropriate     Co-evaluation                 AM-PAC OT "6 Clicks" Daily Activity     Outcome Measure   Help from another person eating meals?: None Help from another person taking care of personal grooming?: A Little Help from another person toileting, which includes using toliet, bedpan, or urinal?: A Little Help from another person bathing (including washing, rinsing, drying)?: A Little Help from another person to put on and taking off regular upper body clothing?: A Little Help from another person to put on and taking off regular lower body clothing?: A Little 6 Click Score: 19    End of Session Equipment Utilized During Treatment: Gait belt  OT Visit Diagnosis: Unsteadiness on feet (R26.81);Muscle weakness (generalized) (M62.81)   Activity Tolerance Patient tolerated treatment well   Patient Left in chair;with call bell/phone within reach;with nursing/sitter in room   Nurse Communication          Time: 3151-7616 OT Time Calculation (min): 17 min  Charges: OT General Charges $OT Visit: 1 Visit OT Treatments $Self Care/Home Management : 8-22 mins    Margaretmary Eddy [facility] 09/27/2022, 1:07 PM

## 2022-09-27 NOTE — Progress Notes (Signed)
Patient arrived to the room with his eye glasses at bedside, upper partial plate in place, cel phone, and bags of personal belongings. Patient is alert and oriented x4, denies pain. Patient was placed on tele monitor with cont. Pulse ox, placement was called and verified with [facility] staff.  Edward Campos

## 2022-09-27 NOTE — Progress Notes (Signed)
PROGRESS NOTE    Edward Campos  TKZ:[id number] DOB: 1932-09-11 DOA: 09/24/2022 PCP: Patient, No Pcp Per    Brief Narrative:  89 year old male with past medical history of small cell lung cancer with left lower lobe mass diagnosed 08/13/2022, hyperlipidemia, peripheral vascular disease, AAA, BPH, anxiety, who presented to [facility] 7/15 for SOB and cough.  Patient had initially contacted [facility] this.  Had been having a productive sputum diaphoresis for a week worsening over few days.  Initially Came to urgent care center and was subsequently sent to the ED.  In the ED, patient did have some chest discomfort, was afebrile but hypotensive.  Labs showed WBC of 10.8 with creatinine at 1.3 from baseline 1.1.  Troponins were flat.  Procalcitonin 0.6.  Chest x-ray showed increasing infiltrate on the left lung base with postobstructive pneumonia. CTA Chest negative for PE, but positive for left lung mass  increased irregular airspace disease (LLL) c/w postobstructive PNA and skeletal metastases. Despite fluid resuscitation and midodrine, patient remained hypotensive and peripheral vasopressors were initiated.  Patient was then admitted to the ICU setting initially.  Subsequently patient was considered stable for transfer out of the ICU.   Assessment and Plan:  Undifferentiated shock Initially refractory to midodrine.  Required peripheral Levophed.  Has been discontinued.    Thought to be secondary to  postobstructive pneumonia. MRSA PCR not detected. Was initially on Flagyl and Zyvox has been changed to cefepime since 09/25/2022.   Postobstructive pneumonia Left lower lung mass Small cell lung cancer with bony metastasis Continue supportive care.  Continue antibiotic, incentive spirometry breathing treatment.  Patient is supposed to follow-up with oncology for chemotherapy initiation as outpatient once the wound heals.   Severe peripheral arterial disease S/p femorofemoral bypass, Continue wound  management.  As reported in place awaiting for wound healing prior to chemotherapy.   Hyperlipidemia AAA Stable.      DVT prophylaxis: heparin injection 5,000 Units Start: 09/25/22 1400 SCDs Start: 09/25/22 0357   Code Status:     Code Status: Full Code  Disposition: Home likely in 1 to 2 days.  Status is: Inpatient  Remains inpatient appropriate because: IV antibiotic, postobstructive pneumonia, status post ICU stay,   Family Communication: None at bedside  Consultants:  PCCM  Procedures:  None  Antimicrobials:  Cefepime IV 7/16  Anti-infectives (From admission, onward)    Start     Dose/Rate Route Frequency Ordered Stop   09/25/22 0430  linezolid (ZYVOX) IVPB 600 mg  Status:  Discontinued        600 mg 300 mL/hr over 60 Minutes Intravenous Every 12 hours 09/25/22 0340 09/26/22 0810   09/25/22 0415  ceFEPIme (MAXIPIME) 2 g in sodium chloride 0.9 % 100 mL IVPB        2 g 200 mL/hr over 30 Minutes Intravenous Every 12 hours 09/25/22 0406 09/30/22 0959   09/25/22 0415  metroNIDAZOLE (FLAGYL) IVPB 500 mg  Status:  Discontinued        500 mg 100 mL/hr over 60 Minutes Intravenous Every 12 hours 09/25/22 0406 09/26/22 0810      Subjective: Today, patient was seen and examined at bedside.  Patient states that he still has some shortness of breath but no greenish-yellow phlegm.  Has whitish phlegm.  Has some chest pain when coughing.  Denies any fever chills or rigor.  Objective: Vitals:   09/27/22 0512 09/27/22 0513 09/27/22 0818 09/27/22 1221  BP:  (!) 148/61 (!) 141/81 (!) 152/76  Pulse:  70  66 75  Resp:  20 18 (!) 22  Temp:  97.7 F (36.5 C) (!) 97.5 F (36.4 C) 98 F (36.7 C)  TempSrc:  Oral  Oral  SpO2:   93% 95%  Weight: 81.8 kg       Intake/Output Summary (Last 24 hours) at 09/27/2022 1244 Last data filed at 09/27/2022 0833 Gross per 24 hour  Intake 572 ml  Output --  Net 572 ml   Filed Weights   09/25/22 0730 09/26/22 0500 09/27/22 0512   Weight: 77.8 kg 80 kg 81.8 kg    Physical Examination: Body mass index is 26.63 kg/m.  General:  Average built, not in obvious distress HENT:   No scleral pallor or icterus noted. Oral mucosa is moist.  Chest:  Clear breath sounds.  Coarse breath sounds noted, mild tachypneic.   CVS: S1 &S2 heard. No murmur.  Regular rate and rhythm. Abdomen: Soft, nontender, nondistended.  Bowel sounds are heard.   Extremities: No cyanosis, clubbing or edema.  Peripheral pulses are palpable. Psych: Alert, awake and oriented, normal mood CNS:  No cranial nerve deficits.  Power equal in all extremities.   Skin: Warm and dry.  No rashes noted.  Data Reviewed:   CBC: Recent Labs  Lab 09/24/22 1640 09/25/22 0337 09/26/22 1359 09/27/22 0030  WBC 10.8* 8.9 7.1 6.9  NEUTROABS  --  6.8  --   --   HGB 12.9* 11.1* 11.3* 11.0*  HCT 37.1* 31.6* 32.7* 31.4*  MCV 88.5 87.3 87.7 85.8  PLT 213 177 184 181    Basic Metabolic Panel: Recent Labs  Lab 09/24/22 1640 09/25/22 0337 09/26/22 1359 09/27/22 0030  NA 131* 129* 131* 128*  K 4.0 4.0 3.5 4.0  CL 95* 103 101 102  CO2 22 19* 24 22  GLUCOSE 99 103* 117* 109*  BUN 38* 31* 18 18  CREATININE 1.36* 1.11 1.01 0.95  CALCIUM 8.7* 7.5* 7.5* 7.5*  MG  --  1.5* 2.2 2.0  PHOS  --  2.3*  --   --     Liver Function Tests: Recent Labs  Lab 09/25/22 0337  AST 26  ALT 29  ALKPHOS 53  BILITOT 1.6*  PROT 4.6*  ALBUMIN 2.5*     Radiology Studies: DG Chest Port 1 View  Result Date: 09/26/2022 CLINICAL DATA:  Pneumonia EXAM: PORTABLE CHEST 1 VIEW COMPARISON:  Chest CT from 2 days ago FINDINGS: Increased interstitial opacity with Kerley lines. There is a background of architectural distortion from COPD. Infiltrate at the left base by recent chest CT with underlying mass. Borderline heart size. IMPRESSION: 1. Increased interstitial opacity with Charyl Dancer lines attributed to pulmonary edema. 2. Known postobstructive pneumonia at the left base.  Electronically Signed   By: Tiburcio Pea M.D.   On: 09/26/2022 06:01      LOS: 3 days    Joycelyn Das, MD [facility] Available via Epic secure chat 7am-7pm After these hours, please refer to coverage provider listed on [email or web address] 09/27/2022, 12:44 PM

## 2022-09-27 NOTE — TOC Initial Note (Signed)
Transition of Care [facility]) - Initial/Assessment Note    Patient Details  Name: Edward Campos MRN: [id number] Date of Birth: 12/24/1932  Transition [facility] CM/SW Contact:    Janae Bridgeman, RN Phone Number: 09/27/2022, 10:17 AM  Clinical Narrative:                 CM met with the patient at the bedside to discuss of TOC needs.  The patient admitted for Sepsis, pneumonia and remains on RA.  The patient lives alone and plans to return home with active [facility] services through [facility].  I called [facility] and placed orders for [facility] RN to be co-signed by attending MD.  Patient was evaluated by PT and will need a RW before returning home.  Patient has a 3:1 but does not use it.  Patient did not have a preference for DME company.  [employer] called to deliver RW to the room today.  No other TOC needs.  Expected Discharge Plan: Home w Home Health Services Barriers to Discharge: Continued Medical Work up   Patient Goals and CMS Choice Patient states their goals for this hospitalization and ongoing recovery are:: To go home CMS Medicare.gov Compare Post Acute Care list provided to:: Patient Choice offered to / list presented to : Patient [place] ownership interest in [facility].provided to:: Patient    Expected Discharge Plan and Services   Discharge Planning Services: CM Consult Post Acute Care Choice: Home Health Living arrangements for the past 2 months: Single Family Home                 DME Arranged: Walker rolling DME Agency: [employer] Date DME Agency Contacted: 09/27/22 Time DME Agency Contacted: 1015 Representative spoke with at DME Agency: Vaughan Basta, CM with [employer] HH Arranged: RN HH Agency: [facility] Date HH Agency Contacted: 09/27/22 Time HH Agency Contacted: 1016 Representative spoke with at [facility] Agency: Morrie Sheldon, CM with [facility] - accepted for [facility] RN  Prior Living Arrangements/Services Living arrangements for the past 2 months: Single  Family Home Lives with:: Self Patient language and need for interpreter reviewed:: Yes Do you feel safe going back to the place where you live?: Yes      Need for Family Participation in Patient Care: Yes (Comment) Care giver support system in place?: Yes (comment) Current home services: Home RN (Active with [facility]) Criminal Activity/Legal Involvement Pertinent to Current Situation/Hospitalization: No - Comment as needed  Activities of Daily Living Home Assistive Devices/Equipment: None ADL Screening (condition at time of admission) Patient's cognitive ability adequate to safely complete daily activities?: Yes Is the patient deaf or have difficulty hearing?: Yes Does the patient have difficulty seeing, even when wearing glasses/contacts?: No Does the patient have difficulty concentrating, remembering, or making decisions?: No Patient able to express need for assistance with ADLs?: Yes Does the patient have difficulty dressing or bathing?: No Independently performs ADLs?: Yes (appropriate for developmental age) Does the patient have difficulty walking or climbing stairs?: No Weakness of Legs: None Weakness of Arms/Hands: None  Permission Sought/Granted Permission sought to share information with : Case Manager, Oceanographer granted to share information with : Yes, Verbal Permission Granted     Permission granted to share info w AGENCY: [facility] - contacted for continued HH for RN,  [employer] D[facility] called for RW  Permission granted to share info w Relationship: daughter     Emotional Assessment Appearance:: Appears stated age Attitude/Demeanor/Rapport: Gracious Affect (typically observed): Accepting  Orientation: : Oriented to Self, Oriented to Place, Oriented to  Time, Oriented to Situation Alcohol / Substance Use: Illicit Drugs Psych Involvement: No (comment)  Admission diagnosis:  Pneumonia [J18.9] Hypoxia [R09.02] Pneumonia of left lower lobe due to  infectious organism [J18.9] Patient Active Problem List   Diagnosis Date Noted   Shock (HCC) 09/25/2022   Pneumonia 09/06/2022   Small cell lung cancer, left lower lobe (HCC) 08/30/2022   Mass of left lung 08/21/2022   Infection of vascular bypass graft, initial encounter (HCC) 08/16/2022   Typical atrial flutter (HCC)    Non-healing left groin open wound, initial encounter 07/06/2019   Status post insertion of iliac artery stent 06/08/2019   Peripheral vascular disease (HCC) 06/08/2019   PCP:  Patient, No Pcp Per Pharmacy:   [id number] - [place], [place] - [address] [place] [address] Phone: [phone] Fax: [phone]  [facility] [id number] - [place], [place] - [address] AT [facility] [address] [place] [address] Phone: [phone] Fax: [phone]     Social Determinants of Health (SDOH) Social History: SDOH Screenings   Food Insecurity: No Food Insecurity (09/25/2022)  Housing: Low Risk  (09/25/2022)  Transportation Needs: No Transportation Needs (08/20/2022)  Utilities: Not At Risk (08/20/2022)  Tobacco Use: Medium Risk (09/19/2022)   SDOH Interventions:     Readmission Risk Interventions     No data to display

## 2022-09-28 DIAGNOSIS — I739 Peripheral vascular disease, unspecified: Secondary | ICD-10-CM | POA: Diagnosis not present

## 2022-09-28 DIAGNOSIS — C3432 Malignant neoplasm of lower lobe, left bronchus or lung: Secondary | ICD-10-CM | POA: Diagnosis not present

## 2022-09-28 DIAGNOSIS — J189 Pneumonia, unspecified organism: Secondary | ICD-10-CM | POA: Diagnosis not present

## 2022-09-28 DIAGNOSIS — R579 Shock, unspecified: Secondary | ICD-10-CM | POA: Diagnosis not present

## 2022-09-28 LAB — CULTURE, BLOOD (ROUTINE X 2)
Culture: NO GROWTH
Special Requests: ADEQUATE

## 2022-09-28 LAB — BASIC METABOLIC PANEL
Anion gap: 7 (ref 5–15)
BUN: 16 mg/dL (ref 8–23)
CO2: 23 mmol/L (ref 22–32)
Calcium: 7.8 mg/dL — ABNORMAL LOW (ref 8.9–10.3)
Chloride: 100 mmol/L (ref 98–111)
Creatinine, Ser: 0.91 mg/dL (ref 0.61–1.24)
GFR, Estimated: >60 mL/min (ref >60)
Glucose, Bld: 92 mg/dL (ref 70–99)
Potassium: 4.3 mmol/L (ref 3.5–5.1)
Sodium: 130 mmol/L — ABNORMAL LOW (ref 135–145)

## 2022-09-28 LAB — CBC
HCT: 33 % — ABNORMAL LOW (ref 39.0–52.0)
Hemoglobin: 11.6 g/dL — ABNORMAL LOW (ref 13.0–17.0)
MCH: 30.8 pg (ref 26.0–34.0)
MCHC: 35.2 g/dL (ref 30.0–36.0)
MCV: 87.5 fL (ref 80.0–100.0)
Platelets: 199 10*3/uL (ref 150–400)
RBC: 3.77 MIL/uL — ABNORMAL LOW (ref 4.22–5.81)
RDW: 13.5 % (ref 11.5–15.5)
WBC: 7.4 10*3/uL (ref 4.0–10.5)
nRBC: 0 % (ref 0.0–0.2)

## 2022-09-28 LAB — MAGNESIUM: Magnesium: 1.7 mg/dL (ref 1.7–2.4)

## 2022-09-28 MED ORDER — LOPERAMIDE HCL 2 MG PO TABS: 2.0000 mg | ORAL_TABLET | Freq: Two times a day (BID) | ORAL | 0 refills | Status: DC | PRN

## 2022-09-28 MED ORDER — CEFDINIR 300 MG PO CAPS: 300.0000 mg | ORAL_CAPSULE | Freq: Two times a day (BID) | ORAL | 0 refills | Status: DC

## 2022-09-28 MED ORDER — LOPERAMIDE HCL 2 MG PO CAPS
ORAL_CAPSULE | ORAL | Status: AC
  Filled 2022-09-28: qty 2

## 2022-09-28 MED ORDER — LOPERAMIDE HCL 2 MG PO CAPS
4.0000 mg | ORAL_CAPSULE | Freq: Once | ORAL | Status: AC
  Administered 2022-09-28: 4 mg via ORAL

## 2022-09-28 NOTE — Discharge Summary (Addendum)
Physician Discharge Summary  Vernard Gram JSE:[id number] DOB: [birth year] DOA: 09/24/2022  PCP: Patient, No Pcp Edward Campos  Admit date: 09/24/2022 Discharge date: 09/28/2022  Admitted From: Home  Discharge disposition: Home    Recommendations for Outpatient Follow-Up:   Follow up with your primary care provider in one week.  Check CBC, BMP, magnesium in the next visit Follow up with oncology, vascular surgery as scheduled   Discharge Diagnosis:   Principal Problem:   Pneumonia Active Problems:   Peripheral vascular disease (HCC)   Small cell lung cancer, left lower lobe (HCC)   Shock (HCC)   Discharge Condition: Improved.  Diet recommendation: Regular.  Wound care: None.  Code status: Full.   History of Present Illness:   89 year old male with past medical history of small cell lung cancer with left lower lobe mass diagnosed 08/13/2022, hyperlipidemia, peripheral vascular disease, AAA, BPH, anxiety, who presented to [facility] 7/15 for SOB and cough.  Patient had initially contacted [facility] this.  Had been having a productive sputum diaphoresis for a week worsening over few days.  Initially Came to urgent care center and was subsequently sent to the ED.  In the ED, patient did have some chest discomfort, was afebrile but hypotensive.  Labs showed WBC of 10.8 with creatinine at 1.3 from baseline 1.1.  Troponins were flat.  Procalcitonin 0.6.  Chest x-ray showed increasing infiltrate on the left lung base with postobstructive pneumonia. CTA Chest negative for PE, but positive for left lung mass  increased irregular airspace disease (LLL) c/w postobstructive PNA and skeletal metastases. Despite fluid resuscitation and midodrine, patient remained hypotensive and peripheral vasopressors were initiated.  Patient was then admitted to the ICU setting initially.  Subsequently patient was considered stable for transfer out of the ICU.    Hospital Course:   Following conditions were  addressed during hospitalization as listed below,  Septic shock, ruled in Initially refractory to midodrine.  Required peripheral Levophed.  Has been discontinued and blood pressure stable now.    MRSA PCR not detected. Was initially on Flagyl and Zyvox has been changed to cefepime since 09/25/2022. Will change to [facility] on discharge to complete the course. No leukocytosis.   Episodes of diarrhea. Communicated with infectious disease Dr Drue Second. She indicated likely antibiotic associated and no recs for c. diff testing. Will put on imodium on discharge. Encouraged fluids, probiotics.    Postobstructive pneumonia Left lower lung mass Small cell lung cancer with bony metastasis   Continue omnicef on discharge. Was on cefepime. High chance of recurrence.  Patient is supposed to follow-up with oncology for chemotherapy initiation as outpatient once the wound heals   Severe peripheral arterial disease S/p femorofemoral bypass, Continue wound management.  Seen by vascular surgery during hospitalization,  awaiting for wound healing prior to chemotherapy.   Hyperlipidemia AAA Stable.  Disposition.  At this time, patient is stable for disposition Home with OP PCP, vascular and oncology follow up. Spoke with daughter prior to discharge.   Medical Consultants:   PCCM   Procedures:    None Subjective:   Today, patient states that he had frequent loose stools but got better with imodium. No dyspnea, chest pain. Has mild cough.   Discharge Exam:   Vitals:   09/28/22 0500 09/28/22 0834  BP: (!) 143/71 (!) 162/85  Pulse: 68 84  Resp: 20   Temp: 99 F (37.2 C) 99.9 F (37.7 C)  SpO2:  94%   Vitals:   09/27/22 1936 09/28/22 0112 09/28/22  0500 09/28/22 0834  BP: (!) 145/71 (!) 157/84 (!) 143/71 (!) 162/85  Pulse: 84 75 68 84  Resp: 18 18 20    Temp: 100 F (37.8 C) 98.7 F (37.1 C) 99 F (37.2 C) 99.9 F (37.7 C)  TempSrc:   Oral Oral  SpO2: 93% 95%  94%  Weight:       Body mass  index is 26.63 kg/m.   General: Alert awake, not in obvious distress HENT: pupils equally reacting to light,  No scleral pallor or icterus noted. Oral mucosa is moist.  Chest:   Diminished breath sounds bilaterally.  CVS: S1 &S2 heard. No murmur.  Regular rate and rhythm. Abdomen: Soft, nontender, nondistended.  Bowel sounds are heard.   Extremities: No cyanosis, clubbing or edema.  Peripheral pulses are palpable. Psych: Alert, awake and oriented, normal mood CNS:  No cranial nerve deficits.  Power equal in all extremities.   Skin: Warm and dry.  No rashes noted.  The results of significant diagnostics from this hospitalization (including imaging, microbiology, ancillary and laboratory) are listed below for reference.     Diagnostic Studies:   CT Angio Chest PE W/Cm &/Or Wo Cm  Result Date: 09/24/2022 CLINICAL DATA:  Shortness of breath chest pain EXAM: CT ANGIOGRAPHY CHEST WITH CONTRAST TECHNIQUE: Multidetector CT imaging of the chest was performed using the standard protocol during bolus administration of intravenous contrast. Multiplanar CT image reconstructions and MIPs were obtained to evaluate the vascular anatomy. RADIATION DOSE REDUCTION: This exam was performed according to the departmental dose-optimization program which includes automated exposure control, adjustment of the mA and/or kV according to patient size and/or use of iterative reconstruction technique. CONTRAST:  75mL OMNIPAQUE IOHEXOL 350 MG/ML SOLN COMPARISON:  Chest x-ray 09/24/2022, PET CT 09/06/2022, CT chest 08/19/2022 FINDINGS: Cardiovascular: Satisfactory opacification of the pulmonary arteries to the segmental level. No evidence of pulmonary embolism. Advanced aortic atherosclerosis. Mild aneurysmal dilatation up to 4.1 cm. Coronary vascular calcifications. No pericardial effusion. Mediastinum/Nodes: Midline trachea. No thyroid mass. Multiple small AP window lymph nodes. Esophagus within normal limits. Left hilar node  measuring 3 cm compared with 2 cm previously, now contiguous with the left lower lobe lung mass. Left hilar node slightly superior measures 19 mm compared with 13 mm previously. A small right hilar lymph node measures 12 mm. Lungs/Pleura: Emphysema. Left lower lobe irregular solid mass exhibits slightly different morphology compared to the prior CT, it measures about 6 0.1 by 4.8 cm compared with 6 x 4.7 cm. Increased irregular airspace disease in the left lower lobe compared to prior. No significant pleural effusion. Upper Abdomen: No acute abnormality. Musculoskeletal: No acute osseous abnormality. Faintly visible lucent lesions within the thoracic vertebra, corresponding to history of metastatic disease on PET CT but better visualized on that exam Review of the MIP images confirms the above findings. IMPRESSION: 1. Negative for acute pulmonary embolus. 2. Left lower lobe lung mass consistent with known malignancy. Increased irregular airspace disease in the left lower lobe compared to prior suggesting postobstructive pneumonia. 3. Slight interval increase in size of left hilar lymph nodes, consistent with metastatic disease. 4. Faintly visible lucent lesions within the thoracic vertebra, corresponding to history of metastatic disease. Skeletal metastatic disease 2 is better 3 seen on the prior PET CT. Aortic Atherosclerosis (ICD10-I70.0) and Emphysema (ICD10-J43.9). Electronically Signed   By: Jasmine Pang M.D.   On: 09/24/2022 21:02   DG Chest 2 View  Result Date: 09/24/2022 CLINICAL DATA:  Shortness of breath, cough, and  chest pain. EXAM: CHEST - 2 VIEW COMPARISON:  CT 08/19/2022 FINDINGS: Heart size and pulmonary vascularity are normal for technique. Developing infiltration in the left lung base. This suggest developing pneumonia or postobstructive change. The mass lesion seen in the left infrahilar region on prior CT is not well demonstrated radiographically. No pleural effusions. No pneumothorax.  Mediastinal contours appear intact. IMPRESSION: Increasing infiltration in the left lung base likely representing developing pneumonia or postobstructive change. Known left infrahilar mass is not well demonstrated radiographically. Electronically Signed   By: Burman Nieves M.D.   On: 09/24/2022 17:08     Labs:   Basic Metabolic Panel: Recent Labs  Lab 09/24/22 1640 09/25/22 0337 09/26/22 1359 09/27/22 0030  NA 131* 129* 131* 128*  K 4.0 4.0 3.5 4.0  CL 95* 103 101 102  CO2 22 19* 24 22  GLUCOSE 99 103* 117* 109*  BUN 38* 31* 18 18  CREATININE 1.36* 1.11 1.01 0.95  CALCIUM 8.7* 7.5* 7.5* 7.5*  MG  --  1.5* 2.2 2.0  PHOS  --  2.3*  --   --    GFR Estimated Creatinine Clearance: 54.8 mL/min (by C-G formula based on SCr of 0.95 mg/dL). Liver Function Tests: Recent Labs  Lab 09/25/22 0337  AST 26  ALT 29  ALKPHOS 53  BILITOT 1.6*  PROT 4.6*  ALBUMIN 2.5*   No results for input(s): "LIPASE", "AMYLASE" in the last 168 hours. No results for input(s): "AMMONIA" in the last 168 hours. Coagulation profile No results for input(s): "INR", "PROTIME" in the last 168 hours.  CBC: Recent Labs  Lab 09/24/22 1640 09/25/22 0337 09/26/22 1359 09/27/22 0030 09/28/22 0301  WBC 10.8* 8.9 7.1 6.9 7.4  NEUTROABS  --  6.8  --   --   --   HGB 12.9* 11.1* 11.3* 11.0* 11.6*  HCT 37.1* 31.6* 32.7* 31.4* 33.0*  MCV 88.5 87.3 87.7 85.8 87.5  PLT 213 177 184 181 199   Cardiac Enzymes: No results for input(s): "CKTOTAL", "CKMB", "CKMBINDEX", "TROPONINI" in the last 168 hours. BNP: Invalid input(s): "POCBNP" CBG: Recent Labs  Lab 09/25/22 0758  GLUCAP 119*   D-Dimer No results for input(s): "DDIMER" in the last 72 hours. Hgb A1c No results for input(s): "HGBA1C" in the last 72 hours. Lipid Profile No results for input(s): "CHOL", "HDL", "LDLCALC", "TRIG", "CHOLHDL", "LDLDIRECT" in the last 72 hours. Thyroid function studies No results for input(s): "TSH", "T4TOTAL", "T3FREE",  "THYROIDAB" in the last 72 hours.  Invalid input(s): "FREET3" Anemia work up No results for input(s): "VITAMINB12", "FOLATE", "FERRITIN", "TIBC", "IRON", "RETICCTPCT" in the last 72 hours. Microbiology Recent Results (from the past 240 hour(s))  MRSA Next Gen by PCR, Nasal     Status: None   Collection Time: 09/24/22 10:58 PM   Specimen: Nasal Mucosa; Nasal Swab  Result Value Ref Range Status   MRSA by PCR Next Gen NOT DETECTED NOT DETECTED Final    Comment: (NOTE) The GeneXpert MRSA Assay (FDA approved for NASAL specimens only), is one component of a comprehensive MRSA colonization surveillance program. It is not intended to diagnose MRSA infection nor to guide or monitor treatment for MRSA infections. Test performance is not FDA approved in patients less than 82 years old. Performed at [facility], [address], [place], [place] [address]   Culture, blood (Routine X 2) w Reflex to ID Panel     Status: None (Preliminary result)   Collection Time: 09/24/22 11:20 PM   Specimen: BLOOD  Result Value Ref Range Status   Specimen Description BLOOD SITE NOT SPECIFIED  Final   Special Requests   Final    BOTTLES DRAWN AEROBIC AND ANAEROBIC Blood Culture adequate volume   Culture   Final    NO GROWTH 4 DAYS Performed at [facility], [address], [place], [place] [address]    Report Status PENDING  Incomplete  Culture, blood (Routine X 2) w Reflex to ID Panel     Status: None (Preliminary result)   Collection Time: 09/24/22 11:23 PM   Specimen: BLOOD  Result Value Ref Range Status   Specimen Description BLOOD SITE NOT SPECIFIED  Final   Special Requests   Final    BOTTLES DRAWN AEROBIC AND ANAEROBIC Blood Culture results may not be optimal due to an inadequate volume of blood received in culture bottles   Culture   Final    NO GROWTH 4 DAYS Performed at [facility], [address] [place], [place] [address]    Report Status PENDING  Incomplete      Discharge Instructions:   Discharge Instructions     Call MD for:  persistant nausea and vomiting   Complete by: As directed    Call MD for:  severe uncontrolled pain   Complete by: As directed    Call MD for:  temperature >100.4   Complete by: As directed    Diet general   Complete by: As directed    Discharge instructions   Complete by: As directed    Follow-up with your primary care provider in 1 week.  Check blood work at that time.  Increase fluid hydration. Seek medical attention for worsening symptoms.  Complete course of antibiotic.  Take over the counter probiotic pill. Follow up with oncology and vascular surgery as scheduled by you.   Discharge wound care:   Complete by: As directed    Pack right groin wound with silver hydrofiber , cover with small foam dressing. Change daily   Increase activity slowly   Complete by: As directed       Allergies as of 09/28/2022       Reactions   Penicillins Other (See Comments)        Medication List     TAKE these medications    aspirin EC 81 MG tablet Take 1 tablet (81 mg total) by mouth daily. Swallow whole.   cefdinir 300 MG capsule Commonly known as: OMNICEF Take 1 capsule (300 mg total) by mouth 2 (two) times daily for 5 days.   loperamide 2 MG tablet Commonly known as: Imodium A-D Take 1 tablet (2 mg total) by mouth 2 (two) times daily as needed for diarrhea or loose stools.   multivitamin with minerals tablet Take 1 tablet by mouth daily.   rosuvastatin 20 MG tablet Commonly known as: CRESTOR TAKE 1 TABLET(20 MG) BY MOUTH DAILY               Durable Medical Equipment  (From admission, onward)           Start     Ordered   09/27/22 1022  For home use only DME Walker rolling  Once       Question Answer Comment  Walker: With 5 Inch Wheels   Patient needs a walker to treat with the following condition Generalized weakness      09/27/22 1022              Discharge Care  Instructions  (From admission, onward)           Start     Ordered   09/28/22 0000  Discharge wound care:       Comments: Pack right groin wound with silver hydrofiber , cover with small foam dressing. Change daily   09/28/22 1448            Follow-up Information     [place] Follow up.   Why: [facility] will be providing home health services.  They will call you in the next 24-48 hours to provide services. Contact information: [address] [place] [address] [phone]         Benita Stabile, MD. Schedule an appointment as soon as possible for a visit.   Specialty: Internal Medicine Contact information: [address] [place] [address] [phone]         [facility], [facility] Follow up.   Why: [employer] will provide you with a RW before you are discharged home. Contact information: [address] [place] [address] [phone]                  Time coordinating discharge: 39 minutes  Signed:  Rodneisha Bonnet  [facility] 09/28/2022, 2:48 PM

## 2022-09-28 NOTE — Progress Notes (Signed)
Discharge instructions given with stated understanding 

## 2022-09-28 NOTE — Progress Notes (Signed)
Physical Therapy Treatment Patient Details Name: Edward Campos MRN: [id number] DOB: [birth year] Today's Date: 09/28/2022   History of Present Illness 89 yo male admitted 7/15 with cough, hypoxia and obstructive PNA. Hypotensive requiring vasopressors. PMhx: HTN, AAA, anxiety, recently diagnosed (June 2024 ) left lower lung mass/small cell lung cancer, PVD s/p femoral-femoral BPG complicated by infection s/p I&D with wound VAC placement to R groin,    PT Comments  Pt greeted seated up EOB and agreeable to session with continued progress towards acute goals. Pt requiring grossly min guard for transfers and gait without AD support, however pt reaching for external support intermittently. Pt able to demonstrate safe stair ascent/descent with min guard for safety and cues for increased foot clearance. Educated pt on self pacing and energy conservation strategies with pt verbalizing understanding. Pt continues to benefit from skilled PT services to progress toward functional mobility goals.      Assistance Recommended at Discharge PRN  If plan is discharge home, recommend the following:  Can travel by private vehicle           Equipment Recommendations  Rolling walker (2 wheels)    Recommendations for Other Services       Precautions / Restrictions Precautions Precautions: Fall Restrictions Weight Bearing Restrictions: No     Mobility  Bed Mobility Overal bed mobility: Modified Independent             General bed mobility comments: sitting up EOB on arrival    Transfers Overall transfer level: Needs assistance Equipment used: None Transfers: Sit to/from Stand Sit to Stand: Supervision           General transfer comment: supervision for safety    Ambulation/Gait Ambulation/Gait assistance: Min guard Gait Distance (Feet): 400 Feet Assistive device: None Gait Pattern/deviations: Step-through pattern, Drifts right/left Gait velocity: normal     General Gait  Details: some drift to the right and would reach out to stabilize when he neared the wall or item no LOB, cues for self pacing as pt with DOE 3/4 and HR 120bpm   Stairs Stairs: Yes Stairs assistance: Min guard Stair Management: One rail Right, Alternating pattern, Forwards Number of Stairs: 12 (flight) General stair comments: light cues on descent for improved foot clearance as pt striking/catching heel on edge of step on descent   Wheelchair Mobility     Tilt Bed    Modified Rankin (Stroke Patients Only)       Balance Overall balance assessment: Needs assistance Sitting-balance support: No upper extremity supported Sitting balance-Leahy Scale: Good     Standing balance support: No upper extremity supported, During functional activity Standing balance-Leahy Scale: Fair Standing balance comment: no LOB noted when standing unsupported                            Cognition Arousal/Alertness: Awake/alert Behavior During Therapy: WFL for tasks assessed/performed Overall Cognitive Status: Within Functional Limits for tasks assessed                                 General Comments: Patient pleasant but states he gets agitated due to having to go to bathroom often        Exercises      General Comments General comments (skin integrity, edema, etc.): HR up to 120bpm with mobility, decreasing to mid 90s with seated resting      Pertinent Vitals/Pain Pain  Assessment Pain Assessment: No/denies pain Pain Intervention(s): Monitored during session    Home Living                          Prior Function            PT Goals (current goals can now be found in the care plan section) Acute Rehab PT Goals PT Goal Formulation: With patient Time For Goal Achievement: 10/10/22 Progress towards PT goals: Progressing toward goals    Frequency    Min 3X/week      PT Plan Current plan remains appropriate    Co-evaluation               AM-PAC PT "6 Clicks" Mobility   Outcome Measure  Help needed turning from your back to your side while in a flat bed without using bedrails?: None Help needed moving from lying on your back to sitting on the side of a flat bed without using bedrails?: None Help needed moving to and from a bed to a chair (including a wheelchair)?: A Little Help needed standing up from a chair using your arms (e.g., wheelchair or bedside chair)?: A Little Help needed to walk in hospital room?: A Little Help needed climbing 3-5 steps with a railing? : A Little 6 Click Score: 20    End of Session   Activity Tolerance: Patient tolerated treatment well Patient left: in bed;with call bell/phone within reach;with family/visitor present (seated EOB) Nurse Communication: Mobility status PT Visit Diagnosis: Other abnormalities of gait and mobility (R26.89)     Time: 1511-1530 PT Time Calculation (min) (ACUTE ONLY): 19 min  Charges:    $Gait Training: 8-22 mins PT General Charges $$ ACUTE PT VISIT: 1 Visit                     Tobi Bastos R. PTA Acute Rehabilitation Services Office: [phone]   Catalina Antigua 09/28/2022, 3:44 PM

## 2022-09-28 NOTE — Progress Notes (Signed)
  Progress Note    09/28/2022 10:23 AM * No surgery found *  Subjective:  having diarrhea  Vitals:   09/28/22 0500 09/28/22 0834  BP: (!) 143/71 (!) 162/85  Pulse: 68 84  Resp: 20   Temp: 99 F (37.2 C) 99.9 F (37.7 C)  SpO2:  94%    Physical Exam: Aaox3 Graft appears to be healing in  CBC    Component Value Date/Time   WBC 7.4 09/28/2022 0301   RBC 3.77 (L) 09/28/2022 0301   HGB 11.6 (L) 09/28/2022 0301   HGB 13.9 10/27/2020 1038   HCT 33.0 (L) 09/28/2022 0301   HCT 41.0 10/27/2020 1038   PLT 199 09/28/2022 0301   PLT 208 10/27/2020 1038   MCV 87.5 09/28/2022 0301   MCV 90 10/27/2020 1038   MCH 30.8 09/28/2022 0301   MCHC 35.2 09/28/2022 0301   RDW 13.5 09/28/2022 0301   RDW 12.7 10/27/2020 1038   LYMPHSABS 0.3 (L) 09/25/2022 0337   MONOABS 0.8 09/25/2022 0337   EOSABS 0.9 (H) 09/25/2022 0337   BASOSABS 0.1 09/25/2022 0337    BMET    Component Value Date/Time   NA 128 (L) 09/27/2022 0030   NA 138 10/27/2020 1038   K 4.0 09/27/2022 0030   CL 102 09/27/2022 0030   CO2 22 09/27/2022 0030   GLUCOSE 109 (H) 09/27/2022 0030   BUN 18 09/27/2022 0030   BUN 17 10/27/2020 1038   CREATININE 0.95 09/27/2022 0030   CALCIUM 7.5 (L) 09/27/2022 0030   GFRNONAA >60 09/27/2022 0030   GFRAA >60 07/07/2019 0259    INR    Component Value Date/Time   INR 1.2 08/16/2022 0637     Intake/Output Summary (Last 24 hours) at 09/28/2022 1023 Last data filed at 09/27/2022 1255 Gross per 24 hour  Intake --  Output 300 ml  Net -300 ml     Assessment:  89 y.o. male is here with pneumonia, previous surgery for infected fem-fem graft  Plan: Continue local wound care and abx for pneumonia  Hopeful we can get this to heal without additional surgery given difficulty of any additional procedures and underlying comorbid conditions   Biviana Saddler C. Randie Heinz, MD [facility] Office: [phone] Pager: [phone]  09/28/2022 10:23 AM

## 2022-09-28 NOTE — Progress Notes (Signed)
Occupational Therapy Treatment Patient Details Name: Edward Campos MRN: [id number] DOB: [birth year] Today's Date: 09/28/2022   History of present illness 89 yo male admitted 7/15 with cough, hypoxia and obstructive PNA. Hypotensive requiring vasopressors. PMhx: HTN, AAA, anxiety, recently diagnosed (June 2024 ) left lower lung mass/small cell lung cancer, PVD s/p femoral-femoral BPG complicated by infection s/p I&D with wound VAC placement to R groin,   OT comments  Patient demonstrating good gains with supervision for toilet transfers, standing at sink for grooming, and LB dressing with no AD for mobility. Patient demonstrated fatigue during mobility and provided red therapy band and exercises to increase activity tolerance. Discharge recommendations continue to be appropriate. Acute OT to continue to follow.    Recommendations for follow up therapy are one component of a multi-disciplinary discharge planning process, led by the attending physician.  Recommendations may be updated based on patient status, additional functional criteria and insurance authorization.    Assistance Recommended at Discharge PRN  Patient can return home with the following  Assistance with cooking/housework   Equipment Recommendations  None recommended by OT    Recommendations for Other Services      Precautions / Restrictions Precautions Precautions: Fall Restrictions Weight Bearing Restrictions: No       Mobility Bed Mobility Overal bed mobility: Modified Independent             General bed mobility comments: did not require assistance to get to EOB or return to supine    Transfers Overall transfer level: Needs assistance Equipment used: None Transfers: Sit to/from Stand Sit to Stand: Supervision           General transfer comment: supervision to perform toilet transfers and mobility in room; patient states he takes himself to bathroom     Balance Overall balance assessment: Needs  assistance Sitting-balance support: No upper extremity supported Sitting balance-Leahy Scale: Good     Standing balance support: No upper extremity supported, During functional activity Standing balance-Leahy Scale: Fair Standing balance comment: no LOB noted when standing unsupported                           ADL either performed or assessed with clinical judgement   ADL Overall ADL's : Needs assistance/impaired     Grooming: Wash/dry hands;Wash/dry face;Supervision/safety;Standing           Upper Body Dressing : Supervision/safety;Standing Upper Body Dressing Details (indicate cue type and reason): donned gown to cover back Lower Body Dressing: Supervision/safety;Sitting/lateral leans Lower Body Dressing Details (indicate cue type and reason): to change socks and donn slippers Toilet Transfer: Supervision/safety;Ambulation;Grab bars Toilet Transfer Details (indicate cue type and reason): used bathroom twice during session Toileting- Clothing Manipulation and Hygiene: Modified independent;Sitting/lateral lean              Extremity/Trunk Assessment              Vision       Perception     Praxis      Cognition Arousal/Alertness: Awake/alert Behavior During Therapy: WFL for tasks assessed/performed Overall Cognitive Status: Within Functional Limits for tasks assessed                                 General Comments: Patient pleasant but states he gets agitated due to having to go to bathroom often        Exercises Exercises: General Upper Extremity  General Exercises - Upper Extremity Shoulder Flexion: Strengthening, Both, 10 reps, Seated, Theraband Theraband Level (Shoulder Flexion): Level 2 (Red) Shoulder Horizontal ABduction: Strengthening, Both, 10 reps, Seated, Theraband Theraband Level (Shoulder Horizontal Abduction): Level 2 (Red)    Shoulder Instructions       General Comments      Pertinent Vitals/ Pain        Pain Assessment Pain Assessment: No/denies pain Pain Intervention(s): Monitored during session  Home Living                                          Prior Functioning/Environment              Frequency  Min 2X/week        Progress Toward Goals  OT Goals(current goals can now be found in the care plan section)  Progress towards OT goals: Progressing toward goals  Acute Rehab OT Goals Patient Stated Goal: go home OT Goal Formulation: With patient Time For Goal Achievement: 10/10/22 Potential to Achieve Goals: Good ADL Goals Pt Will Perform Grooming: with modified independence;standing Pt Will Perform Lower Body Dressing: with modified independence;sit to/from stand Pt Will Transfer to Toilet: with modified independence;ambulating;regular height toilet Pt Will Perform Toileting - Clothing Manipulation and hygiene: with modified independence;sit to/from stand  Plan Discharge plan remains appropriate    Co-evaluation                 AM-PAC OT "6 Clicks" Daily Activity     Outcome Measure   Help from another person eating meals?: None Help from another person taking care of personal grooming?: A Little Help from another person toileting, which includes using toliet, bedpan, or urinal?: A Little Help from another person bathing (including washing, rinsing, drying)?: A Little Help from another person to put on and taking off regular upper body clothing?: A Little Help from another person to put on and taking off regular lower body clothing?: A Little 6 Click Score: 19    End of Session Equipment Utilized During Treatment: Gait belt  OT Visit Diagnosis: Unsteadiness on feet (R26.81);Muscle weakness (generalized) (M62.81)   Activity Tolerance Patient tolerated treatment well   Patient Left in bed;with call bell/phone within reach (seated on EOB)   Nurse Communication Mobility status        Time: 1610-9604 OT Time Calculation (min): 25  min  Charges: OT General Charges $OT Visit: 1 Visit OT Treatments $Self Care/Home Management : 8-22 mins $Therapeutic Exercise: 8-22 mins  Alfonse Flavors, OTA Acute Rehabilitation Services  Office [phone]   Dewain Penning 09/28/2022, 11:19 AM

## 2022-09-28 NOTE — Hospital Course (Signed)
89 year old male with past medical history of small cell lung cancer with left lower lobe mass diagnosed 08/13/2022, hyperlipidemia, peripheral vascular disease, AAA, BPH, anxiety, who presented to [facility] 7/15 for SOB and cough.  Patient had initially contacted [facility] this.  Had been having a productive sputum diaphoresis for a week worsening over few days.  Initially Came to urgent care center and was subsequently sent to the ED.  In the ED, patient did have some chest discomfort, was afebrile but hypotensive.  Labs showed WBC of 10.8 with creatinine at 1.3 from baseline 1.1.  Troponins were flat.  Procalcitonin 0.6.  Chest x-ray showed increasing infiltrate on the left lung base with postobstructive pneumonia. CTA Chest negative for PE, but positive for left lung mass  increased irregular airspace disease (LLL) c/w postobstructive PNA and skeletal metastases. Despite fluid resuscitation and midodrine, patient remained hypotensive and peripheral vasopressors were initiated.  Patient was then admitted to the ICU setting initially.  Subsequently patient was considered stable for transfer out of the ICU.

## 2022-09-29 DIAGNOSIS — I7143 Infrarenal abdominal aortic aneurysm, without rupture: Secondary | ICD-10-CM | POA: Diagnosis not present

## 2022-09-29 DIAGNOSIS — T827XXA Infection and inflammatory reaction due to other cardiac and vascular devices, implants and grafts, initial encounter: Secondary | ICD-10-CM | POA: Diagnosis not present

## 2022-09-29 DIAGNOSIS — I739 Peripheral vascular disease, unspecified: Secondary | ICD-10-CM | POA: Diagnosis not present

## 2022-09-29 DIAGNOSIS — C3490 Malignant neoplasm of unspecified part of unspecified bronchus or lung: Secondary | ICD-10-CM | POA: Diagnosis not present

## 2022-09-29 DIAGNOSIS — Z7982 Long term (current) use of aspirin: Secondary | ICD-10-CM | POA: Diagnosis not present

## 2022-09-29 LAB — CULTURE, BLOOD (ROUTINE X 2): Culture: NO GROWTH

## 2022-09-30 DIAGNOSIS — M1990 Unspecified osteoarthritis, unspecified site: Secondary | ICD-10-CM | POA: Diagnosis not present

## 2022-09-30 DIAGNOSIS — J189 Pneumonia, unspecified organism: Secondary | ICD-10-CM | POA: Diagnosis not present

## 2022-09-30 DIAGNOSIS — I7143 Infrarenal abdominal aortic aneurysm, without rupture: Secondary | ICD-10-CM | POA: Diagnosis not present

## 2022-09-30 DIAGNOSIS — C7951 Secondary malignant neoplasm of bone: Secondary | ICD-10-CM | POA: Diagnosis not present

## 2022-09-30 DIAGNOSIS — Z7982 Long term (current) use of aspirin: Secondary | ICD-10-CM | POA: Diagnosis not present

## 2022-09-30 DIAGNOSIS — Z792 Long term (current) use of antibiotics: Secondary | ICD-10-CM | POA: Diagnosis not present

## 2022-09-30 DIAGNOSIS — T827XXA Infection and inflammatory reaction due to other cardiac and vascular devices, implants and grafts, initial encounter: Secondary | ICD-10-CM | POA: Diagnosis not present

## 2022-09-30 DIAGNOSIS — C3432 Malignant neoplasm of lower lobe, left bronchus or lung: Secondary | ICD-10-CM | POA: Diagnosis not present

## 2022-09-30 DIAGNOSIS — I739 Peripheral vascular disease, unspecified: Secondary | ICD-10-CM | POA: Diagnosis not present

## 2022-09-30 DIAGNOSIS — N400 Enlarged prostate without lower urinary tract symptoms: Secondary | ICD-10-CM | POA: Diagnosis not present

## 2022-09-30 DIAGNOSIS — Z87891 Personal history of nicotine dependence: Secondary | ICD-10-CM | POA: Diagnosis not present

## 2022-09-30 DIAGNOSIS — F419 Anxiety disorder, unspecified: Secondary | ICD-10-CM | POA: Diagnosis not present

## 2022-09-30 DIAGNOSIS — J440 Chronic obstructive pulmonary disease with acute lower respiratory infection: Secondary | ICD-10-CM | POA: Diagnosis not present

## 2022-09-30 DIAGNOSIS — E871 Hypo-osmolality and hyponatremia: Secondary | ICD-10-CM | POA: Diagnosis not present

## 2022-09-30 DIAGNOSIS — E785 Hyperlipidemia, unspecified: Secondary | ICD-10-CM | POA: Diagnosis not present

## 2022-10-01 DIAGNOSIS — C7951 Secondary malignant neoplasm of bone: Secondary | ICD-10-CM | POA: Diagnosis not present

## 2022-10-01 DIAGNOSIS — J189 Pneumonia, unspecified organism: Secondary | ICD-10-CM | POA: Diagnosis not present

## 2022-10-01 DIAGNOSIS — C3432 Malignant neoplasm of lower lobe, left bronchus or lung: Secondary | ICD-10-CM | POA: Diagnosis not present

## 2022-10-01 DIAGNOSIS — I739 Peripheral vascular disease, unspecified: Secondary | ICD-10-CM | POA: Diagnosis not present

## 2022-10-01 DIAGNOSIS — J440 Chronic obstructive pulmonary disease with acute lower respiratory infection: Secondary | ICD-10-CM | POA: Diagnosis not present

## 2022-10-01 DIAGNOSIS — T827XXA Infection and inflammatory reaction due to other cardiac and vascular devices, implants and grafts, initial encounter: Secondary | ICD-10-CM | POA: Diagnosis not present

## 2022-10-03 ENCOUNTER — Ambulatory Visit (INDEPENDENT_AMBULATORY_CARE_PROVIDER_SITE_OTHER): Payer: Medicare Other | Admitting: Physician Assistant

## 2022-10-03 VITALS — BP 170/68 | HR 66 | Temp 97.9°F | Wt 164.0 lb

## 2022-10-03 DIAGNOSIS — T827XXA Infection and inflammatory reaction due to other cardiac and vascular devices, implants and grafts, initial encounter: Secondary | ICD-10-CM

## 2022-10-03 DIAGNOSIS — I739 Peripheral vascular disease, unspecified: Secondary | ICD-10-CM

## 2022-10-03 NOTE — Progress Notes (Signed)
Office Note     CC:  follow up Requesting Provider:  No ref. provider found  HPI: Edward Campos is a 89 y.o. ([birth year]) male who presents for wound follow up. He is s/p 1.  Irrigation and debridement with VAC change of wound overlying femoral-femoral bypass graft 2.  Application of 38 cm skin substitute using Kerecis to wound overlying femoral-femoral bypass (J1914) on 08/22/22 by Dr. Chestine Spore. Prior to this he underwent  1.  Sharp excisional debridement of skin and subcutaneous tissue overlying the femoral-femoral bypass graft to 5 x 2 x 3 cm and pulse evac lavage totaling 2 L 2.  Application 38 cm fish skin with negative pressure wound VAC dressing on 08/16/22 by Dr. Randie Heinz. He was indicated for this following history of right to left femorofemoral bypass presenting with evidence of infection of his femorofemoral bypass graft.   He has been following up frequently for wound checks. He is here again today with his daughter. He was just released from hospital on Friday 7/19 after being admitted for pneumonia. His wound was evaluated in hospital during this admission. No signs of infection or indication for further surgical intervention. The wound VAC was discontinued.  He also denies any bleeding from the wound. He does report continued drainage with some purulence to it. He has been keeping dry gauze on the wound. He denies any fevers, chills, nausea/vomiting.    Past Surgical History:  Procedure Laterality Date   ABDOMINAL AORTOGRAM W/LOWER EXTREMITY N/A 05/06/2019   Procedure: ABDOMINAL AORTOGRAM W/LOWER EXTREMITY;  Surgeon: Cephus Shelling, MD;  Location: [facility];  Service: Cardiovascular;  Laterality: N/A;   ABDOMINAL WOUND DEHISCENCE N/A 08/22/2022   Procedure: WASHOUT ABDOMEN WOUND AND VAC CHANGE;  Surgeon: Cephus Shelling, MD;  Location: [facility] OR;  Service: Vascular;  Laterality: N/A;   APPLICATION OF WOUND VAC  07/06/2019   Procedure: Application Of Wound Vac Left Groin;   Surgeon: Sherren Kerns, MD;  Location: [facility] OR;  Service: Vascular;;   APPLICATION OF WOUND VAC N/A 08/16/2022   Procedure: APPLICATION OF WOUND VAC;  Surgeon: [name], MD;  Location: [facility] OR;  Service: Vascular;  Laterality: N/A;   BRONCHIAL BIOPSY  08/21/2022   Procedure: BRONCHIAL BIOPSIES;  Surgeon: Oretha Milch, MD;  Location: [facility] ENDOSCOPY;  Service: Cardiopulmonary;;   BRONCHIAL BRUSHINGS  08/21/2022   Procedure: BRONCHIAL BRUSHINGS;  Surgeon: Oretha Milch, MD;  Location: [facility] ENDOSCOPY;  Service: Cardiopulmonary;;   BRONCHIAL WASHINGS  08/21/2022   Procedure: BRONCHIAL WASHINGS;  Surgeon: Oretha Milch, MD;  Location: [facility] ENDOSCOPY;  Service: Cardiopulmonary;;   CARDIOVERSION N/A 11/03/2020   Procedure: CARDIOVERSION;  Surgeon: Thurmon Fair, MD;  Location: [facility] ENDOSCOPY;  Service: Cardiovascular;  Laterality: N/A;   ENDARTERECTOMY FEMORAL Bilateral 06/08/2019   Procedure: ENDARTERECTOMY RIGHT COMMON FEMORAL; ENDARTERECTOMY RIGHT EXTERNAL ILIAC; ENDARTERECTOMY LEFT COMMON FEMORAL; ENDARTERECTOMY LEFT EXTERNAL ILIAC;  Surgeon: Sherren Kerns, MD;  Location: [facility] OR;  Service: Vascular;  Laterality: Bilateral;   FEMORAL-FEMORAL BYPASS GRAFT Bilateral 06/08/2019   Procedure: BYPASS GRAFT FEMORAL-FEMORAL ARTERY;  Surgeon: Sherren Kerns, MD;  Location: [facility] OR;  Service: Vascular;  Laterality: Bilateral;   GROIN DEBRIDEMENT Left 07/06/2019   Procedure: INCISION AND DRAINAGE OF LEFT GROIN;  Surgeon: Sherren Kerns, MD;  Location: [facility] OR;  Service: Vascular;  Laterality: Left;   growth removed from chest     HEMORRHOID SURGERY     HEMOSTASIS CONTROL  08/21/2022   Procedure: HEMOSTASIS CONTROL;  Surgeon:  Oretha Milch, MD;  Location: [facility] ENDOSCOPY;  Service: Cardiopulmonary;;   HERNIA REPAIR  12 yrs ago   left inguinal hernia   INCISE AND DRAIN ABCESS Left 07/06/2019   Incision and drainage left groin placement of VAC dressing   INCISION AND DRAINAGE OF WOUND Right 08/16/2022    Procedure: IRRIGATION AND DEBRIDEMENT WOUND;  Surgeon: [name], MD;  Location: [facility] OR;  Service: Vascular;  Laterality: Right;   INGUINAL HERNIA REPAIR  10/16/2010   Procedure: HERNIA REPAIR INGUINAL ADULT;  Surgeon: Dalia Heading;  Location: [facility] ORS;  Service: General;  Laterality: Right;   INSERTION OF ILIAC STENT Right 06/08/2019   Procedure: INSERTION OF RIGHT COMMON ILIAC AND EXTERNAL ILICAC STENT;  Surgeon: Sherren Kerns, MD;  Location: [facility] OR;  Service: Vascular;  Laterality: Right;   PATCH ANGIOPLASTY Right 06/08/2019   Procedure: Patch Angioplasty Right Common Femoral;  Surgeon: Sherren Kerns, MD;  Location: [facility] OR;  Service: Vascular;  Laterality: Right;   VIDEO BRONCHOSCOPY N/A 08/21/2022   Procedure: VIDEO BRONCHOSCOPY WITH FLUORO;  Surgeon: Oretha Milch, MD;  Location: [facility] ENDOSCOPY;  Service: Cardiopulmonary;  Laterality: N/A;    Social History   Socioeconomic History   Marital status: Divorced    Spouse name: Not on file   Number of children: 3   Years of education: Not on file   Highest education level: Not on file  Occupational History   Not on file  Tobacco Use   Smoking status: Former    Current packs/day: 0.00    Average packs/day: 1.5 packs/day for 70.0 years (105.0 ttl pk-yrs)    Types: Cigarettes    Start date: 05/29/1949    Quit date: 05/30/2019    Years since quitting: 3.3   Smokeless tobacco: Never  Vaping Use   Vaping status: Never Used  Substance and Sexual Activity   Alcohol use: Yes    Alcohol/week: 3.0 standard drinks of alcohol    Types: 3 Shots of liquor per week    Comment: 3 2oz. shots of bourbon/day   Drug use: No   Sexual activity: Not on file  Other Topics Concern   Not on file  Social History Narrative   Not on file   Social Determinants of Health   Financial Resource Strain: Not on file  Food Insecurity: No Food Insecurity (09/25/2022)   Hunger Vital Sign    Worried About Running Out of Food in the Last Year:  Never true    Ran Out of Food in the Last Year: Never true  Transportation Needs: No Transportation Needs (08/20/2022)   PRAPARE - Administrator, Civil Service (Medical): No    Lack of Transportation (Non-Medical): No  Physical Activity: Not on file  Stress: Not on file  Social Connections: Not on file  Intimate Partner Violence: Not At Risk (08/20/2022)   Humiliation, Afraid, Rape, and Kick questionnaire    Fear of Current or Ex-Partner: No    Emotionally Abused: No    Physically Abused: No    Sexually Abused: No    Family History  Problem Relation Age of Onset   Stroke Mother    Pseudochol deficiency Neg Hx    Malignant hyperthermia Neg Hx    Hypotension Neg Hx    Anesthesia problems Neg Hx     Current Outpatient Medications  Medication Sig Dispense Refill   aspirin EC 81 MG tablet Take 1 tablet (81 mg total) by mouth daily. Swallow whole.  90 tablet 3   loperamide (IMODIUM A-D) 2 MG tablet Take 1 tablet (2 mg total) by mouth 2 (two) times daily as needed for diarrhea or loose stools. 20 tablet 0   Multiple Vitamins-Minerals (MULTIVITAMIN WITH MINERALS) tablet Take 1 tablet by mouth daily.     rosuvastatin (CRESTOR) 20 MG tablet TAKE 1 TABLET(20 MG) BY MOUTH DAILY 90 tablet 12   No current facility-administered medications for this visit.    Allergies  Allergen Reactions   Penicillins Other (See Comments)     REVIEW OF SYSTEMS:   [X]  denotes positive finding, [ ]  denotes negative finding Cardiac  Comments:  Chest pain or chest pressure:    Shortness of breath upon exertion:    Short of breath when lying flat:    Irregular heart rhythm:        Vascular    Pain in calf, thigh, or hip brought on by ambulation:    Pain in feet at night that wakes you up from your sleep:     Blood clot in your veins:    Leg swelling:         Pulmonary    Oxygen at home:    Productive cough:     Wheezing:         Neurologic    Sudden weakness in arms or legs:      Sudden numbness in arms or legs:     Sudden onset of difficulty speaking or slurred speech:    Temporary loss of vision in one eye:     Problems with dizziness:         Gastrointestinal    Blood in stool:     Vomited blood:         Genitourinary    Burning when urinating:     Blood in urine:        Psychiatric    Major depression:         Hematologic    Bleeding problems:    Problems with blood clotting too easily:        Skin    Rashes or ulcers:        Constitutional    Fever or chills:      PHYSICAL EXAMINATION:  Vitals:   10/03/22 0925  BP: (!) 170/68  Pulse: 66  Temp: 97.9 F (36.6 C)  TempSrc: Temporal  SpO2: 96%  Weight: 164 lb (74.4 kg)    General:  WDWN in NAD; vital signs documented above Gait: Normal HENT: WNL, normocephalic Pulmonary: normal non-labored breathing , without  wheezing Cardiac: regular HR Abdomen: soft, NT, no masses Vascular Exam/Pulses: 2+ femoral, palpable pulse in fem- fem bypass graft Extremities: without ischemic changes, without Gangrene , without cellulitis; with small open wound overlying fem- fem bypass as shown below. There was some yellowish slough like drainage on dressings. No expressible drainage. Used a cotton tipped applicator to probe the wound and it went down about 3 cm into wound bed. I packed this with 1/4 iodoform, 2x2, 4x4 and tape  Musculoskeletal: no muscle wasting or atrophy  Neurologic: A&O X 3 Psychiatric:  The pt has normal affect.   ASSESSMENT/PLAN:: 89 y.o. male here for follow up for wound check. He is s/p 1.  Irrigation and debridement with VAC change of wound overlying femoral-femoral bypass graft 2.  Application of 38 cm skin substitute using Kerecis to wound overlying femoral-femoral bypass (Z6109) on 08/22/22 by Dr. Chestine Spore. Prior to this he underwent  1.  Lambert Mody  excisional debridement of skin and subcutaneous tissue overlying the femoral-femoral bypass graft to 5 x 2 x 3 cm and pulse evac lavage  totaling 2 L 2.  Application 38 cm fish skin with negative pressure wound VAC dressing on 08/16/22 by Dr. Randie Heinz. He was indicated for this following history of right to left femorofemoral bypass presenting with evidence of infection of his femorofemoral bypass graft. Wound overlying fem- fem remains open and I was able to probe down about 3 cm. Hard to know if graft is covered with granulation tissue. Unable to express any drainage although there was a good bit of drainage on dressings he had in place.  No signs of infection at this time.  - Legs remain well perfused and warm with palpable pulse in fem- fem bypass - Daughter and patient  shown how to pack with iodoform gauze. He will need to do daily dressing changes. Okay for [facility] RN to come 2x/week to assist with wound care - he will follow up again in 2 weeks. I will have him see Dr. Randie Heinz at that time to continue to monitor wound healing. Hopeful to continue to avoid any surgical intervention   Graceann Congress, PA-C [facility] [phone]  Clinic MD:   Cain/ Edilia Bo

## 2022-10-04 DIAGNOSIS — T827XXA Infection and inflammatory reaction due to other cardiac and vascular devices, implants and grafts, initial encounter: Secondary | ICD-10-CM | POA: Diagnosis not present

## 2022-10-04 DIAGNOSIS — C7951 Secondary malignant neoplasm of bone: Secondary | ICD-10-CM | POA: Diagnosis not present

## 2022-10-04 DIAGNOSIS — C3432 Malignant neoplasm of lower lobe, left bronchus or lung: Secondary | ICD-10-CM | POA: Diagnosis not present

## 2022-10-04 DIAGNOSIS — J440 Chronic obstructive pulmonary disease with acute lower respiratory infection: Secondary | ICD-10-CM | POA: Diagnosis not present

## 2022-10-04 DIAGNOSIS — J189 Pneumonia, unspecified organism: Secondary | ICD-10-CM | POA: Diagnosis not present

## 2022-10-04 DIAGNOSIS — I739 Peripheral vascular disease, unspecified: Secondary | ICD-10-CM | POA: Diagnosis not present

## 2022-10-08 DIAGNOSIS — T827XXA Infection and inflammatory reaction due to other cardiac and vascular devices, implants and grafts, initial encounter: Secondary | ICD-10-CM | POA: Diagnosis not present

## 2022-10-08 DIAGNOSIS — C3432 Malignant neoplasm of lower lobe, left bronchus or lung: Secondary | ICD-10-CM | POA: Diagnosis not present

## 2022-10-08 DIAGNOSIS — J440 Chronic obstructive pulmonary disease with acute lower respiratory infection: Secondary | ICD-10-CM | POA: Diagnosis not present

## 2022-10-08 DIAGNOSIS — J189 Pneumonia, unspecified organism: Secondary | ICD-10-CM | POA: Diagnosis not present

## 2022-10-08 DIAGNOSIS — I739 Peripheral vascular disease, unspecified: Secondary | ICD-10-CM | POA: Diagnosis not present

## 2022-10-08 DIAGNOSIS — C7951 Secondary malignant neoplasm of bone: Secondary | ICD-10-CM | POA: Diagnosis not present

## 2022-10-11 DIAGNOSIS — C7951 Secondary malignant neoplasm of bone: Secondary | ICD-10-CM | POA: Diagnosis not present

## 2022-10-11 DIAGNOSIS — J440 Chronic obstructive pulmonary disease with acute lower respiratory infection: Secondary | ICD-10-CM | POA: Diagnosis not present

## 2022-10-11 DIAGNOSIS — J189 Pneumonia, unspecified organism: Secondary | ICD-10-CM | POA: Diagnosis not present

## 2022-10-11 DIAGNOSIS — T827XXA Infection and inflammatory reaction due to other cardiac and vascular devices, implants and grafts, initial encounter: Secondary | ICD-10-CM | POA: Diagnosis not present

## 2022-10-11 DIAGNOSIS — C3432 Malignant neoplasm of lower lobe, left bronchus or lung: Secondary | ICD-10-CM | POA: Diagnosis not present

## 2022-10-11 DIAGNOSIS — I739 Peripheral vascular disease, unspecified: Secondary | ICD-10-CM | POA: Diagnosis not present

## 2022-10-14 ENCOUNTER — Encounter: Payer: Self-pay | Admitting: Vascular Surgery

## 2022-10-15 DIAGNOSIS — J440 Chronic obstructive pulmonary disease with acute lower respiratory infection: Secondary | ICD-10-CM | POA: Diagnosis not present

## 2022-10-15 DIAGNOSIS — T827XXA Infection and inflammatory reaction due to other cardiac and vascular devices, implants and grafts, initial encounter: Secondary | ICD-10-CM | POA: Diagnosis not present

## 2022-10-15 DIAGNOSIS — I739 Peripheral vascular disease, unspecified: Secondary | ICD-10-CM | POA: Diagnosis not present

## 2022-10-15 DIAGNOSIS — J189 Pneumonia, unspecified organism: Secondary | ICD-10-CM | POA: Diagnosis not present

## 2022-10-15 DIAGNOSIS — C7951 Secondary malignant neoplasm of bone: Secondary | ICD-10-CM | POA: Diagnosis not present

## 2022-10-15 DIAGNOSIS — C3432 Malignant neoplasm of lower lobe, left bronchus or lung: Secondary | ICD-10-CM | POA: Diagnosis not present

## 2022-10-19 ENCOUNTER — Telehealth: Payer: Self-pay

## 2022-10-19 DIAGNOSIS — C7951 Secondary malignant neoplasm of bone: Secondary | ICD-10-CM | POA: Diagnosis not present

## 2022-10-19 DIAGNOSIS — T827XXA Infection and inflammatory reaction due to other cardiac and vascular devices, implants and grafts, initial encounter: Secondary | ICD-10-CM | POA: Diagnosis not present

## 2022-10-19 DIAGNOSIS — C3432 Malignant neoplasm of lower lobe, left bronchus or lung: Secondary | ICD-10-CM | POA: Diagnosis not present

## 2022-10-19 DIAGNOSIS — I739 Peripheral vascular disease, unspecified: Secondary | ICD-10-CM | POA: Diagnosis not present

## 2022-10-19 DIAGNOSIS — J189 Pneumonia, unspecified organism: Secondary | ICD-10-CM | POA: Diagnosis not present

## 2022-10-19 DIAGNOSIS — J440 Chronic obstructive pulmonary disease with acute lower respiratory infection: Secondary | ICD-10-CM | POA: Diagnosis not present

## 2022-10-19 NOTE — Telephone Encounter (Signed)
Caller: Toma Copier, RN with [facility]  Concern: Pain at wound, bleeding, swollen, hard, increased bloody drainage, low grade fever  Description:  worsening over the past couple of days  Treatments:  packing with iodoform daily  Resolution: Appointment scheduled for first available  Next Appt: Appointment scheduled for 8/12 @ 0845 with PA

## 2022-10-20 ENCOUNTER — Telehealth: Payer: Self-pay | Admitting: Vascular Surgery

## 2022-10-20 NOTE — Telephone Encounter (Signed)
Called regarding groin wound  Prior fem-fem revision in June for pseudoaneurysm.  Midline dressing was saturated this morning. Febrile overnight. New groin mass on inflow side. Large, not pulsatile. Pt stated he has had one before in the groin that went away.  Prior surgery was for bypass infection.  Concern hematoma might be a pseudoaneurysm.   Urged daughter and pt to come to [facility] ED.   On Arrival needs CT angio abdomen/ pelvis.  Please page me.   Victorino Sparrow MD

## 2022-10-22 ENCOUNTER — Inpatient Hospital Stay (HOSPITAL_COMMUNITY): Payer: Medicare Other

## 2022-10-22 ENCOUNTER — Inpatient Hospital Stay (HOSPITAL_COMMUNITY)
Admission: AD | Admit: 2022-10-22 | Discharge: 2022-11-12 | DRG: 252 | Disposition: A | Discharge status: Skilled Nursing Facility | Payer: Medicare Other | Source: Ambulatory Visit | Attending: Internal Medicine | Admitting: Internal Medicine

## 2022-10-22 ENCOUNTER — Other Ambulatory Visit: Payer: Self-pay | Admitting: Physician Assistant

## 2022-10-22 ENCOUNTER — Ambulatory Visit (INDEPENDENT_AMBULATORY_CARE_PROVIDER_SITE_OTHER): Payer: Medicare Other | Admitting: Physician Assistant

## 2022-10-22 ENCOUNTER — Encounter (HOSPITAL_COMMUNITY): Payer: Self-pay

## 2022-10-22 VITALS — BP 160/77 | HR 107 | Temp 98.1°F | Ht 69.0 in | Wt 162.4 lb

## 2022-10-22 DIAGNOSIS — C7951 Secondary malignant neoplasm of bone: Secondary | ICD-10-CM | POA: Diagnosis present

## 2022-10-22 DIAGNOSIS — K5900 Constipation, unspecified: Secondary | ICD-10-CM | POA: Diagnosis present

## 2022-10-22 DIAGNOSIS — I97648 Postprocedural seroma of a circulatory system organ or structure following other circulatory system procedure: Secondary | ICD-10-CM | POA: Diagnosis not present

## 2022-10-22 DIAGNOSIS — I9789 Other postprocedural complications and disorders of the circulatory system, not elsewhere classified: Secondary | ICD-10-CM | POA: Diagnosis not present

## 2022-10-22 DIAGNOSIS — T827XXA Infection and inflammatory reaction due to other cardiac and vascular devices, implants and grafts, initial encounter: Secondary | ICD-10-CM

## 2022-10-22 DIAGNOSIS — B9562 Methicillin resistant Staphylococcus aureus infection as the cause of diseases classified elsewhere: Secondary | ICD-10-CM | POA: Insufficient documentation

## 2022-10-22 DIAGNOSIS — J449 Chronic obstructive pulmonary disease, unspecified: Secondary | ICD-10-CM | POA: Diagnosis not present

## 2022-10-22 DIAGNOSIS — T8131XA Disruption of external operation (surgical) wound, not elsewhere classified, initial encounter: Secondary | ICD-10-CM | POA: Diagnosis not present

## 2022-10-22 DIAGNOSIS — E871 Hypo-osmolality and hyponatremia: Secondary | ICD-10-CM | POA: Insufficient documentation

## 2022-10-22 DIAGNOSIS — C3432 Malignant neoplasm of lower lobe, left bronchus or lung: Secondary | ICD-10-CM | POA: Diagnosis present

## 2022-10-22 DIAGNOSIS — R2689 Other abnormalities of gait and mobility: Secondary | ICD-10-CM | POA: Diagnosis not present

## 2022-10-22 DIAGNOSIS — E222 Syndrome of inappropriate secretion of antidiuretic hormone: Secondary | ICD-10-CM | POA: Diagnosis not present

## 2022-10-22 DIAGNOSIS — D638 Anemia in other chronic diseases classified elsewhere: Secondary | ICD-10-CM | POA: Diagnosis present

## 2022-10-22 DIAGNOSIS — M7981 Nontraumatic hematoma of soft tissue: Secondary | ICD-10-CM | POA: Diagnosis not present

## 2022-10-22 DIAGNOSIS — B999 Unspecified infectious disease: Secondary | ICD-10-CM

## 2022-10-22 DIAGNOSIS — A419 Sepsis, unspecified organism: Secondary | ICD-10-CM | POA: Diagnosis present

## 2022-10-22 DIAGNOSIS — Z79899 Other long term (current) drug therapy: Secondary | ICD-10-CM | POA: Diagnosis not present

## 2022-10-22 DIAGNOSIS — R059 Cough, unspecified: Secondary | ICD-10-CM | POA: Diagnosis not present

## 2022-10-22 DIAGNOSIS — S301XXA Contusion of abdominal wall, initial encounter: Secondary | ICD-10-CM | POA: Diagnosis not present

## 2022-10-22 DIAGNOSIS — E785 Hyperlipidemia, unspecified: Secondary | ICD-10-CM | POA: Diagnosis present

## 2022-10-22 DIAGNOSIS — A4102 Sepsis due to Methicillin resistant Staphylococcus aureus: Secondary | ICD-10-CM

## 2022-10-22 DIAGNOSIS — I701 Atherosclerosis of renal artery: Secondary | ICD-10-CM | POA: Diagnosis not present

## 2022-10-22 DIAGNOSIS — Z823 Family history of stroke: Secondary | ICD-10-CM

## 2022-10-22 DIAGNOSIS — I739 Peripheral vascular disease, unspecified: Secondary | ICD-10-CM | POA: Diagnosis present

## 2022-10-22 DIAGNOSIS — I4891 Unspecified atrial fibrillation: Secondary | ICD-10-CM | POA: Diagnosis not present

## 2022-10-22 DIAGNOSIS — R7881 Bacteremia: Secondary | ICD-10-CM | POA: Insufficient documentation

## 2022-10-22 DIAGNOSIS — I723 Aneurysm of iliac artery: Secondary | ICD-10-CM | POA: Diagnosis not present

## 2022-10-22 DIAGNOSIS — C3490 Malignant neoplasm of unspecified part of unspecified bronchus or lung: Secondary | ICD-10-CM | POA: Diagnosis not present

## 2022-10-22 DIAGNOSIS — I714 Abdominal aortic aneurysm, without rupture, unspecified: Secondary | ICD-10-CM | POA: Insufficient documentation

## 2022-10-22 DIAGNOSIS — I7143 Infrarenal abdominal aortic aneurysm, without rupture: Secondary | ICD-10-CM | POA: Diagnosis present

## 2022-10-22 DIAGNOSIS — L7634 Postprocedural seroma of skin and subcutaneous tissue following other procedure: Secondary | ICD-10-CM | POA: Diagnosis not present

## 2022-10-22 DIAGNOSIS — I483 Typical atrial flutter: Secondary | ICD-10-CM | POA: Diagnosis not present

## 2022-10-22 DIAGNOSIS — F419 Anxiety disorder, unspecified: Secondary | ICD-10-CM | POA: Diagnosis present

## 2022-10-22 DIAGNOSIS — D62 Acute posthemorrhagic anemia: Secondary | ICD-10-CM | POA: Diagnosis not present

## 2022-10-22 DIAGNOSIS — D649 Anemia, unspecified: Secondary | ICD-10-CM | POA: Diagnosis not present

## 2022-10-22 DIAGNOSIS — J189 Pneumonia, unspecified organism: Secondary | ICD-10-CM | POA: Diagnosis not present

## 2022-10-22 DIAGNOSIS — T8579XA Infection and inflammatory reaction due to other internal prosthetic devices, implants and grafts, initial encounter: Secondary | ICD-10-CM | POA: Diagnosis not present

## 2022-10-22 DIAGNOSIS — K219 Gastro-esophageal reflux disease without esophagitis: Secondary | ICD-10-CM | POA: Diagnosis not present

## 2022-10-22 DIAGNOSIS — I4892 Unspecified atrial flutter: Secondary | ICD-10-CM | POA: Diagnosis not present

## 2022-10-22 DIAGNOSIS — E876 Hypokalemia: Secondary | ICD-10-CM | POA: Diagnosis present

## 2022-10-22 DIAGNOSIS — N400 Enlarged prostate without lower urinary tract symptoms: Secondary | ICD-10-CM | POA: Diagnosis present

## 2022-10-22 DIAGNOSIS — I724 Aneurysm of artery of lower extremity: Secondary | ICD-10-CM | POA: Diagnosis present

## 2022-10-22 DIAGNOSIS — Z87891 Personal history of nicotine dependence: Secondary | ICD-10-CM | POA: Diagnosis not present

## 2022-10-22 DIAGNOSIS — R7401 Elevation of levels of liver transaminase levels: Secondary | ICD-10-CM | POA: Diagnosis present

## 2022-10-22 DIAGNOSIS — Z7189 Other specified counseling: Secondary | ICD-10-CM | POA: Diagnosis not present

## 2022-10-22 DIAGNOSIS — Y832 Surgical operation with anastomosis, bypass or graft as the cause of abnormal reaction of the patient, or of later complication, without mention of misadventure at the time of the procedure: Secondary | ICD-10-CM | POA: Diagnosis present

## 2022-10-22 DIAGNOSIS — Z7401 Bed confinement status: Secondary | ICD-10-CM | POA: Diagnosis not present

## 2022-10-22 DIAGNOSIS — T827XXD Infection and inflammatory reaction due to other cardiac and vascular devices, implants and grafts, subsequent encounter: Secondary | ICD-10-CM | POA: Diagnosis not present

## 2022-10-22 DIAGNOSIS — Z880 Allergy status to penicillin: Secondary | ICD-10-CM

## 2022-10-22 DIAGNOSIS — Z7982 Long term (current) use of aspirin: Secondary | ICD-10-CM | POA: Diagnosis not present

## 2022-10-22 DIAGNOSIS — Z515 Encounter for palliative care: Secondary | ICD-10-CM | POA: Diagnosis not present

## 2022-10-22 DIAGNOSIS — R6520 Severe sepsis without septic shock: Secondary | ICD-10-CM | POA: Diagnosis not present

## 2022-10-22 DIAGNOSIS — D72829 Elevated white blood cell count, unspecified: Secondary | ICD-10-CM | POA: Diagnosis not present

## 2022-10-22 DIAGNOSIS — Z7901 Long term (current) use of anticoagulants: Secondary | ICD-10-CM | POA: Diagnosis not present

## 2022-10-22 DIAGNOSIS — I340 Nonrheumatic mitral (valve) insufficiency: Secondary | ICD-10-CM | POA: Diagnosis not present

## 2022-10-22 DIAGNOSIS — T827XXS Infection and inflammatory reaction due to other cardiac and vascular devices, implants and grafts, sequela: Secondary | ICD-10-CM | POA: Diagnosis not present

## 2022-10-22 DIAGNOSIS — L7632 Postprocedural hematoma of skin and subcutaneous tissue following other procedure: Secondary | ICD-10-CM | POA: Diagnosis not present

## 2022-10-22 HISTORY — DX: Unspecified infectious disease: B99.9

## 2022-10-22 LAB — COMPREHENSIVE METABOLIC PANEL
ALT: 46 U/L — ABNORMAL HIGH (ref 0–44)
AST: 41 U/L (ref 15–41)
Albumin: 2.9 g/dL — ABNORMAL LOW (ref 3.5–5.0)
Alkaline Phosphatase: 77 U/L (ref 38–126)
Anion gap: 11 (ref 5–15)
BUN: 21 mg/dL (ref 8–23)
CO2: 22 mmol/L (ref 22–32)
Calcium: 8.3 mg/dL — ABNORMAL LOW (ref 8.9–10.3)
Chloride: 89 mmol/L — ABNORMAL LOW (ref 98–111)
Creatinine, Ser: 1.01 mg/dL (ref 0.61–1.24)
GFR, Estimated: >60 mL/min (ref >60)
Glucose, Bld: 120 mg/dL — ABNORMAL HIGH (ref 70–99)
Potassium: 4.1 mmol/L (ref 3.5–5.1)
Sodium: 122 mmol/L — ABNORMAL LOW (ref 135–145)
Total Bilirubin: 1.1 mg/dL (ref 0.3–1.2)
Total Protein: 6 g/dL — ABNORMAL LOW (ref 6.5–8.1)

## 2022-10-22 LAB — CBC
HCT: 32.4 % — ABNORMAL LOW (ref 39.0–52.0)
Hemoglobin: 11.5 g/dL — ABNORMAL LOW (ref 13.0–17.0)
MCH: 29.4 pg (ref 26.0–34.0)
MCHC: 35.5 g/dL (ref 30.0–36.0)
MCV: 82.9 fL (ref 80.0–100.0)
Platelets: 210 10*3/uL (ref 150–400)
RBC: 3.91 MIL/uL — ABNORMAL LOW (ref 4.22–5.81)
RDW: 14 % (ref 11.5–15.5)
WBC: 13.2 10*3/uL — ABNORMAL HIGH (ref 4.0–10.5)
nRBC: 0 % (ref 0.0–0.2)

## 2022-10-22 MED ORDER — HEPARIN SODIUM (PORCINE) 5000 UNIT/ML IJ SOLN
5000.0000 [IU] | Freq: Three times a day (TID) | INTRAMUSCULAR | Status: DC
  Administered 2022-10-22 – 2022-11-12 (×56): 5000 [IU] via SUBCUTANEOUS
  Filled 2022-10-22 (×58): qty 1

## 2022-10-22 MED ORDER — IOHEXOL 350 MG/ML SOLN
75.0000 mL | Freq: Once | INTRAVENOUS | Status: AC | PRN
  Administered 2022-10-22: 75 mL via INTRAVENOUS

## 2022-10-22 MED ORDER — GUAIFENESIN-DM 100-10 MG/5ML PO SYRP: 15.0000 mL | ORAL_SOLUTION | ORAL | Status: DC | PRN

## 2022-10-22 MED ORDER — LABETALOL HCL 5 MG/ML IV SOLN: 10.0000 mg | INTRAVENOUS | Status: DC | PRN

## 2022-10-22 MED ORDER — ACETAMINOPHEN 325 MG PO TABS
650.0000 mg | ORAL_TABLET | Freq: Four times a day (QID) | ORAL | Status: DC | PRN
  Administered 2022-10-22 – 2022-11-11 (×6): 650 mg via ORAL
  Filled 2022-10-22 (×7): qty 2

## 2022-10-22 MED ORDER — ALUM & MAG HYDROXIDE-SIMETH 200-200-20 MG/5ML PO SUSP: 15.0000 mL | ORAL | Status: DC | PRN

## 2022-10-22 MED ORDER — PANTOPRAZOLE SODIUM 40 MG PO TBEC
40.0000 mg | DELAYED_RELEASE_TABLET | Freq: Every day | ORAL | Status: DC
  Administered 2022-10-26 – 2022-11-12 (×18): 40 mg via ORAL
  Filled 2022-10-22 (×19): qty 1

## 2022-10-22 MED ORDER — IOHEXOL 350 MG/ML SOLN
75.0000 mL | Freq: Once | INTRAVENOUS | Status: AC | PRN
  Administered 2022-10-29: 75 mL via INTRAVENOUS

## 2022-10-22 MED ORDER — ROSUVASTATIN CALCIUM 20 MG PO TABS
20.0000 mg | ORAL_TABLET | Freq: Every day | ORAL | Status: DC
  Administered 2022-10-23 – 2022-10-24 (×2): 20 mg via ORAL
  Filled 2022-10-22 (×2): qty 1

## 2022-10-22 MED ORDER — LOPERAMIDE HCL 2 MG PO CAPS: 2.0000 mg | ORAL_CAPSULE | Freq: Two times a day (BID) | ORAL | Status: DC | PRN

## 2022-10-22 MED ORDER — ASPIRIN 81 MG PO TBEC
81.0000 mg | DELAYED_RELEASE_TABLET | Freq: Every day | ORAL | Status: DC
  Administered 2022-10-23 – 2022-11-12 (×20): 81 mg via ORAL
  Filled 2022-10-22 (×20): qty 1

## 2022-10-22 MED ORDER — SODIUM CHLORIDE 0.9% FLUSH
3.0000 mL | INTRAVENOUS | Status: DC | PRN
  Administered 2022-11-11: 3 mL via INTRAVENOUS

## 2022-10-22 MED ORDER — POTASSIUM CHLORIDE CRYS ER 20 MEQ PO TBCR: 20.0000 meq | EXTENDED_RELEASE_TABLET | Freq: Once | ORAL | Status: DC | PRN

## 2022-10-22 MED ORDER — ONDANSETRON HCL 4 MG/2ML IJ SOLN
4.0000 mg | Freq: Four times a day (QID) | INTRAMUSCULAR | Status: DC | PRN
  Administered 2022-10-25: 4 mg via INTRAVENOUS

## 2022-10-22 MED ORDER — SODIUM CHLORIDE 0.9% FLUSH
3.0000 mL | Freq: Two times a day (BID) | INTRAVENOUS | Status: DC
  Administered 2022-10-22 – 2022-11-11 (×33): 3 mL via INTRAVENOUS

## 2022-10-22 MED ORDER — HYDRALAZINE HCL 20 MG/ML IJ SOLN: 5.0000 mg | INTRAMUSCULAR | Status: DC | PRN

## 2022-10-22 MED ORDER — ADULT MULTIVITAMIN W/MINERALS CH
1.0000 | ORAL_TABLET | Freq: Every day | ORAL | Status: DC
  Administered 2022-10-23 – 2022-11-12 (×20): 1 via ORAL
  Filled 2022-10-22 (×20): qty 1

## 2022-10-22 MED ORDER — SODIUM CHLORIDE 0.9 % IV SOLN: 250.0000 mL | INTRAVENOUS | Status: DC | PRN

## 2022-10-22 MED ORDER — PHENOL 1.4 % MT LIQD: 1.0000 | OROMUCOSAL | Status: DC | PRN

## 2022-10-22 MED ORDER — METOPROLOL TARTRATE 5 MG/5ML IV SOLN: 2.0000 mg | INTRAVENOUS | Status: DC | PRN

## 2022-10-22 NOTE — Progress Notes (Signed)
Pt arrived at the unit,CHG bath given,vitals taken,CCMD notified,PA informed

## 2022-10-22 NOTE — H&P (Signed)
HPI:  89 year old male with history of a right to left femorofemoral bypass with common and external iliac artery stenting on the right with a known abdominal aortic aneurysm.         06/08/2019: Originally Dr. Darrick Penna performed Right common femoral external iliac endarterectomy, right common (9 x 39 VBX) and external iliac stent 8 x 59 VBX, 8 x 40 Inova, right to left femoral-femoral bypass, left external iliac common femoral endarterectomy, patch angioplasty right common femoral artery Dacron for Sever Claudication symptoms.     07/06/19: Incision and drainage left groin placement of VAC dressing for Clear fluid seroma under pressure proximally 20 cc.     08/16/22:Dr. Randie Heinz performed I & D infected graft.  A wound vac was placed post op and he received 2 weeks of antibiotics.     08/22/22:  Dr. Chestine Spore performed Procedure: 1.  Irrigation and debridement with VAC change of wound overlying femoral-femoral bypass graft 2.  Application of 38 cm skin substitute using Kerecis to wound overlying femoral-femoral bypass (U4403)    Pt states he has had a fever and bloody/ purulent drainage from the central fem-fem bypass graft area with left groin firmness and erythema.  He is also battling Small cell lung cancer.  He spoke with DR. Karin Lieu on the phone and decided he did not want to go to the hospital but would  rather come to the office today.                 He is medically managed on ASA and  Crestor.     Allergies      Allergies  Allergen Reactions   Penicillins Other (See Comments)              Current Outpatient Medications  Medication Sig Dispense Refill   aspirin EC 81 MG tablet Take 1 tablet (81 mg total) by mouth daily. Swallow whole. 90 tablet 3   loperamide (IMODIUM A-D) 2 MG tablet Take 1 tablet (2 mg total) by mouth 2 (two) times daily as needed for diarrhea or loose stools. 20 tablet 0   Multiple Vitamins-Minerals (MULTIVITAMIN WITH MINERALS) tablet Take 1 tablet by mouth daily.        rosuvastatin (CRESTOR) 20 MG tablet TAKE 1 TABLET(20 MG) BY MOUTH DAILY 90 tablet 12      No current facility-administered medications for this visit.         ROS:  See HPI   Physical Exam: Vitals:   10/22/22 1455  BP: 131/86  Pulse: 97  Resp: 20  Temp: 98.1 F (36.7 C)  SpO2: 97%      Incision:  open purulent incision central fem-fem bypass with firmness in the left groin.  No obvious erythema over the left groin.  Minimal hypergranulation central incision area.   Pulmonary: normal non-labored breathing , without  wheezing Cardiac: regular HR Abdomen: soft, NT, no masses Vascular Exam/Pulses: 2+ femoral, palpable pulse in fem- fem bypass graft     CTA IMPRESSION: Right bifemoral bypass graft with fluid/inflammatory changes associated with the fem-fem bypass, most prominent on the left, as well as the left common femoral artery. 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   5.8 cm infrarenal abdominal aortic aneurysm, grossly unchanged.   Assessment/Plan:  This is a 89 y.o. male who is s/p: 06/08/2019: Originally Dr. Darrick Penna performed Right common femoral external iliac endarterectomy, right common (9 x 39 VBX) and external iliac stent 8  x 59 VBX, 8 x 40 Inova, right to left femoral-femoral bypass, left external iliac common femoral endarterectomy, patch angioplasty right common femoral artery Dacron for Sever Claudication symptoms.     07/06/19: Incision and drainage left groin placement of VAC dressing for Clear fluid seroma under pressure proximally 20 cc.     08/16/22:Dr. Randie Heinz performed I & D infected graft.  A wound vac was placed post op and he received 2 weeks of antibiotics.     08/22/22:  Dr. Chestine Spore performed Procedure: 1.  Irrigation and debridement with VAC change of wound overlying femoral-femoral bypass graft 2.  Application of 38 cm skin substitute using Kerecis to wound overlying femoral-femoral bypass (W4132)   I  reviewed the CT angio and discussed the results with the patient.  Unfortunately attempts at preserving the graft had failed and there is concern for worsening infection by both physical exam and CT and at this time patient will necessitate graft excision or palliative care.  He is not interested in palliative care currently.  I did discuss that with his underlying cancer and large abdominal aortic aneurysm palliative care may be a better route but the increasing infection and pain will need to be cared for sooner rather than later if we do not pursue palliative care.  He demonstrates good understanding of this.  I have made him n.p.o. and will post him for graft excision tomorrow.  Unfortunately this may leave him with an adequate blood flow to perfuse his left lower extremity and he understands this.  I will discuss with his daughter and him again tomorrow prior to procedure.   C. Randie Heinz, MD [facility] [place] Office: [phone] Pager: [phone]

## 2022-10-22 NOTE — Progress Notes (Signed)
POST OPERATIVE OFFICE NOTE    CC:  F/u for surgery  HPI:  89 year old male with history of a right to left femorofemoral bypass with common and external iliac artery stenting on the right with a known abdominal aortic aneurysm.       06/08/2019: Originally Dr. Darrick Penna performed Right common femoral external iliac endarterectomy, right common (9 x 39 VBX) and external iliac stent 8 x 59 VBX, 8 x 40 Inova, right to left femoral-femoral bypass, left external iliac common femoral endarterectomy, patch angioplasty right common femoral artery Dacron for Sever Claudication symptoms.    07/06/19: Incision and drainage left groin placement of VAC dressing for Clear fluid seroma under pressure proximally 20 cc.    08/16/22:Dr. Randie Heinz performed I & D infected graft.  A wound vac was placed post op and he received 2 weeks of antibiotics.    08/22/22:  Dr. Chestine Spore performed Procedure: 1.  Irrigation and debridement with VAC change of wound overlying femoral-femoral bypass graft 2.  Application of 38 cm skin substitute using Kerecis to wound overlying femoral-femoral bypass (Z6109)   Pt states he has had a fever and bloody/ purulent drainage from the central fem-fem bypass graft area with left groin firmness and erythema.  He is also battling Small cell lung cancer.  He spoke with DR. Karin Lieu on the phone and decided he did not want to go to the hospital but would  rather come to the office today.     He is medically managed on ASA and  Crestor.   Allergies  Allergen Reactions   Penicillins Other (See Comments)    Current Outpatient Medications  Medication Sig Dispense Refill   aspirin EC 81 MG tablet Take 1 tablet (81 mg total) by mouth daily. Swallow whole. 90 tablet 3   loperamide (IMODIUM A-D) 2 MG tablet Take 1 tablet (2 mg total) by mouth 2 (two) times daily as needed for diarrhea or loose stools. 20 tablet 0   Multiple Vitamins-Minerals (MULTIVITAMIN WITH MINERALS) tablet Take 1 tablet by mouth  daily.     rosuvastatin (CRESTOR) 20 MG tablet TAKE 1 TABLET(20 MG) BY MOUTH DAILY 90 tablet 12   No current facility-administered medications for this visit.     ROS:  See HPI  Physical Exam:  Vitals:   10/22/22 0832  BP: (!) 160/77  Pulse: (!) 107  Temp: 98.1 F (36.7 C)  SpO2: 95%     Incision:  open purulent incision central fem-fem bypass with firmness in the left groin.  No obvious erythema over the left groin.  Minimal hypergranulation central incision area.   Pulmonary: normal non-labored breathing , without  wheezing Cardiac: regular HR Abdomen: soft, NT, no masses Vascular Exam/Pulses: 2+ femoral, palpable pulse in fem- fem bypass graft    Assessment/Plan:  This is a 89 y.o. male who is s/p: 06/08/2019: Originally Dr. Darrick Penna performed Right common femoral external iliac endarterectomy, right common (9 x 39 VBX) and external iliac stent 8 x 59 VBX, 8 x 40 Inova, right to left femoral-femoral bypass, left external iliac common femoral endarterectomy, patch angioplasty right common femoral artery Dacron for Sever Claudication symptoms.    07/06/19: Incision and drainage left groin placement of VAC dressing for Clear fluid seroma under pressure proximally 20 cc.    08/16/22:Dr. Randie Heinz performed I & D infected graft.  A wound vac was placed post op and he received 2 weeks of antibiotics.    08/22/22:  Dr. Chestine Spore performed Procedure: 1.  Irrigation  and debridement with VAC change of wound overlying femoral-femoral bypass graft 2.  Application of 38 cm skin substitute using Kerecis to wound overlying femoral-femoral bypass ([id number])  He appears to have an infected right to left fem-fem bypass graft.  We will order a CTA abdomin and pelvis to give [place] more information.  He will need at the least irrigation and debridement of the graft will a high likely hood of re-do bypass pending CTA results.    He will be direct admitted to Dr. Randie Heinz today.      Edward Pigeon PA-C  [facility] [phone]   Clinic MD:  Myra Gianotti

## 2022-10-23 ENCOUNTER — Encounter (HOSPITAL_COMMUNITY): Payer: Self-pay | Admitting: Anesthesiology

## 2022-10-23 ENCOUNTER — Encounter (HOSPITAL_COMMUNITY)
Admission: AD | Disposition: A | Discharge status: Skilled Nursing Facility | Payer: Self-pay | Source: Ambulatory Visit | Attending: Internal Medicine

## 2022-10-23 ENCOUNTER — Encounter (HOSPITAL_COMMUNITY): Payer: Self-pay | Admitting: Vascular Surgery

## 2022-10-23 DIAGNOSIS — T827XXA Infection and inflammatory reaction due to other cardiac and vascular devices, implants and grafts, initial encounter: Secondary | ICD-10-CM

## 2022-10-23 DIAGNOSIS — Z515 Encounter for palliative care: Secondary | ICD-10-CM | POA: Diagnosis not present

## 2022-10-23 DIAGNOSIS — C3432 Malignant neoplasm of lower lobe, left bronchus or lung: Secondary | ICD-10-CM | POA: Diagnosis not present

## 2022-10-23 DIAGNOSIS — Z7189 Other specified counseling: Secondary | ICD-10-CM

## 2022-10-23 DIAGNOSIS — I739 Peripheral vascular disease, unspecified: Secondary | ICD-10-CM

## 2022-10-23 DIAGNOSIS — E871 Hypo-osmolality and hyponatremia: Secondary | ICD-10-CM | POA: Diagnosis not present

## 2022-10-23 DIAGNOSIS — D72829 Elevated white blood cell count, unspecified: Secondary | ICD-10-CM

## 2022-10-23 LAB — COMPREHENSIVE METABOLIC PANEL
ALT: 55 U/L — ABNORMAL HIGH (ref 0–44)
AST: 57 U/L — ABNORMAL HIGH (ref 15–41)
Albumin: 2.6 g/dL — ABNORMAL LOW (ref 3.5–5.0)
Alkaline Phosphatase: 78 U/L (ref 38–126)
Anion gap: 13 (ref 5–15)
BUN: 21 mg/dL (ref 8–23)
CO2: 23 mmol/L (ref 22–32)
Calcium: 8.2 mg/dL — ABNORMAL LOW (ref 8.9–10.3)
Chloride: 90 mmol/L — ABNORMAL LOW (ref 98–111)
Creatinine, Ser: 0.93 mg/dL (ref 0.61–1.24)
GFR, Estimated: >60 mL/min (ref >60)
Glucose, Bld: 157 mg/dL — ABNORMAL HIGH (ref 70–99)
Potassium: 3.4 mmol/L — ABNORMAL LOW (ref 3.5–5.1)
Sodium: 126 mmol/L — ABNORMAL LOW (ref 135–145)
Total Bilirubin: 1.3 mg/dL — ABNORMAL HIGH (ref 0.3–1.2)
Total Protein: 5.8 g/dL — ABNORMAL LOW (ref 6.5–8.1)

## 2022-10-23 LAB — TYPE AND SCREEN
ABO/RH(D): O POS
Antibody Screen: NEGATIVE

## 2022-10-23 LAB — SURGICAL PCR SCREEN
MRSA, PCR: POSITIVE — AB
Staphylococcus aureus: POSITIVE — AB

## 2022-10-23 LAB — CBC
HCT: 32.2 % — ABNORMAL LOW (ref 39.0–52.0)
Hemoglobin: 11.6 g/dL — ABNORMAL LOW (ref 13.0–17.0)
MCH: 30.4 pg (ref 26.0–34.0)
MCHC: 36 g/dL (ref 30.0–36.0)
MCV: 84.3 fL (ref 80.0–100.0)
Platelets: 213 10*3/uL (ref 150–400)
RBC: 3.82 MIL/uL — ABNORMAL LOW (ref 4.22–5.81)
RDW: 13.8 % (ref 11.5–15.5)
WBC: 12 10*3/uL — ABNORMAL HIGH (ref 4.0–10.5)
nRBC: 0 % (ref 0.0–0.2)

## 2022-10-23 LAB — LACTIC ACID, PLASMA: Lactic Acid, Venous: 1.8 mmol/L (ref 0.5–1.9)

## 2022-10-23 SURGERY — CREATION, BYPASS, ARTERIAL, FEMORAL TO FEMORAL, USING GRAFT
Anesthesia: General | Laterality: Bilateral

## 2022-10-23 MED ORDER — MUPIROCIN 2 % EX OINT
1.0000 | TOPICAL_OINTMENT | Freq: Two times a day (BID) | CUTANEOUS | Status: AC
  Administered 2022-10-23 – 2022-10-27 (×9): 1 via NASAL
  Filled 2022-10-23 (×2): qty 22

## 2022-10-23 MED ORDER — SACCHAROMYCES BOULARDII 250 MG PO CAPS
250.0000 mg | ORAL_CAPSULE | Freq: Two times a day (BID) | ORAL | Status: DC
  Administered 2022-10-23 – 2022-10-26 (×6): 250 mg via ORAL
  Filled 2022-10-23 (×6): qty 1

## 2022-10-23 MED ORDER — SODIUM CHLORIDE 0.9 % IV SOLN
2.0000 g | Freq: Two times a day (BID) | INTRAVENOUS | Status: DC
  Administered 2022-10-23 – 2022-10-25 (×5): 2 g via INTRAVENOUS
  Filled 2022-10-23 (×5): qty 12.5

## 2022-10-23 MED ORDER — VANCOMYCIN HCL 1250 MG/250ML IV SOLN
1250.0000 mg | INTRAVENOUS | Status: DC
  Administered 2022-10-23 – 2022-10-24 (×2): 1250 mg via INTRAVENOUS
  Filled 2022-10-23 (×4): qty 250

## 2022-10-23 MED ORDER — CHLORHEXIDINE GLUCONATE CLOTH 2 % EX PADS
6.0000 | MEDICATED_PAD | Freq: Every day | CUTANEOUS | Status: AC
  Administered 2022-10-23 – 2022-10-27 (×4): 6 via TOPICAL

## 2022-10-23 NOTE — Consult Note (Signed)
Consultation Note Date: 10/23/2022   Patient Name: Edward Campos  DOB: [birth year]  MRN: [id number]  Age / Sex: 89 y.o., male  PCP: Patient, No Pcp Per Referring Physician: Juventino Slovak*  Reason for Consultation: Establishing goals of care and Psychosocial/spiritual support  HPI/Patient Profile: 89 y.o. male   admitted on 10/22/2022 with   history of a right to left femorofemoral bypass with common and external iliac artery stenting on the right with a known abdominal aortic aneurysm.       06/08/2019: Originally Dr. Darrick Penna performed Right common femoral external iliac endarterectomy, right common (9 x 39 VBX) and external iliac stent 8 x 59 VBX, 8 x 40 Inova, right to left femoral-femoral bypass, left external iliac common femoral endarterectomy, patch angioplasty right common femoral artery Dacron for severe claudication symptoms.     07/06/19: Incision and drainage left groin placement of VAC dressing for Clear fluid seroma under pressure proximally 20 cc.     08/16/22:Dr. Randie Heinz performed I & D infected graft.  A wound vac was placed post op and he received 2 weeks of antibiotics.     08/22/22:  Dr. Chestine Spore performed Procedure: 1.  Irrigation and debridement with VAC change of wound overlying femoral-femoral bypass graft 2.  Application of 38 cm skin substitute using Kerecis to wound overlying femoral-femoral bypass (W0981)    Pt states he has had a fever and bloody/ purulent drainage from the central fem-fem bypass graft area with left groin firmness and erythema.  He has recently been diagnosed with small cell lung cancer/Dr Elbert Ewings at [facility].     Patient was admitted 2/2 to worsening infection of his Right to Left femorofemoral bypass graft.  Dr Randie Heinz had a detailed discussion with patient and his daughter today.    Per vascular surgery note--Conservative measures have  been unable to heal his wounds and at this time his only surgical option would be graft excision and possible revascularization of the left external iliac artery with endarterectomy and possibly combination of stenting although given the longstanding occlusion this may not be possible and I would not recommend any further extensive surgery i.e. axillary popliteal or axillary profunda bypass which would complicate the procedure and increase infection risk moving forward.   Discussed that a large surgery would require endotracheal tube that he may not be able to be extubated given his recent history of pneumonia with underlying malignancy.    This is a very difficult situation for Edward Campos, none of the available treatment options are appealing.  He understands the seriousness of the situation related to vascular issues compounded by his SCLC     At the time of my meeting today with  Edward Campos and his daughter, patient has made decision to move forward with surgery.   Daughter has already left a message for Dr Randie Heinz    Patient and family face treatment ongoing option decisions, advanced directive decisions and anticipatory care needs.        Clinical Assessment and Goals  of Care:  This NP Lorinda Creed reviewed medical records, received report from team, assessed the patient and then meet at the patient's bedside along with his daughter to discuss diagnosis, prognosis, GOC, EOL wishes disposition and options.   Concept of Palliative Care was introduced as specialized medical care for people and their families living with serious illness.  If focuses on providing relief from the symptoms and stress of a serious illness.  The goal is to improve quality of life for both the patient and the family. Values and goals of care important to patient and family were attempted to be elicited.  Created space and opportunity for patient  and family to explore thoughts and feelings regarding current medical situation.   Both understand the seriousness of his medical situation and worry about the outcomes with or without surgery.  Quality of life and independence are of utmost importance to Edward Campos and so he makes decision to move forward with surgery hoping to achieve both.   Patient was able to express to his daughter his wishes regarding quality of life.  He is open and hopeful for continue quality of life but if that cannot be achieved to his level of acceptance, he wants her to know that at that time he would wish for comfort and dignity allowing for a natural death.  He communicates to his daughter his desire to donate his body to science and for cremation upon his death.      His daughter is very supportive of all his decisions.           Questions and concerns addressed.  Patient  encouraged to call with questions or concerns.     PMT will continue to support holistically.          Patient has documented healthcare power of attorney-- see in ACP tab HPOA/ Edward Campos      SUMMARY OF RECOMMENDATIONS    Code Status/Advance Care Planning: Full code   Palliative Prophylaxis:  Delirium Protocol and Frequent Pain Assessment  Additional Recommendations (Limitations, Scope, Preferences): Full Scope Treatment  Psycho-social/Spiritual:  Desire for further Chaplaincy support:no-declined Additional Recommendations: emotional support offered  Prognosis:  Unable to determine  Discharge Planning: To Be Determined      Primary Diagnoses: Present on Admission: . Infection of vascular bypass graft (HCC)   I have reviewed the medical record, interviewed the patient and family, and examined the patient. The following aspects are pertinent.  Past Medical History:  Diagnosis Date  . AAA (abdominal aortic aneurysm) (HCC)   . Anxiety   . Arthritis   . BPH (benign prostatic hyperplasia)   . Dizziness   . Hyperlipidemia   . PVD (peripheral vascular disease) (HCC)    S/p  fem-fem bypass grafting 08/2022  . SCLC (small cell lung carcinoma), left (HCC)    Social History   Socioeconomic History  . Marital status: Divorced    Spouse name: Not on file  . Number of children: 3  . Years of education: Not on file  . Highest education level: Not on file  Occupational History  . Not on file  Tobacco Use  . Smoking status: Former    Current packs/day: 0.00    Average packs/day: 1.5 packs/day for 70.0 years (105.0 ttl pk-yrs)    Types: Cigarettes    Start date: 05/29/1949    Quit date: 05/30/2019    Years since quitting: 3.4  . Smokeless tobacco: Never  Vaping Use  . Vaping status: Never  Used  Substance and Sexual Activity  . Alcohol use: Yes    Alcohol/week: 3.0 standard drinks of alcohol    Types: 3 Shots of liquor per week    Comment: 3 2oz. shots of bourbon/day  . Drug use: No  . Sexual activity: Not on file  Other Topics Concern  . Not on file  Social History Narrative  . Not on file   Social Determinants of Health   Financial Resource Strain: Not on file  Food Insecurity: No Food Insecurity (09/25/2022)   Hunger Vital Sign   . Worried About Programme researcher, broadcasting/film/video in the Last Year: Never true   . Ran Out of Food in the Last Year: Never true  Transportation Needs: No Transportation Needs (08/20/2022)   PRAPARE - Transportation   . Lack of Transportation (Medical): No   . Lack of Transportation (Non-Medical): No  Physical Activity: Not on file  Stress: Not on file  Social Connections: Not on file   Family History  Problem Relation Age of Onset  . Stroke Mother   . Pseudochol deficiency Neg Hx   . Malignant hyperthermia Neg Hx   . Hypotension Neg Hx   . Anesthesia problems Neg Hx    Scheduled Meds: . aspirin EC  81 mg Oral Daily  . Chlorhexidine Gluconate Cloth  6 each Topical Q0600  . heparin  5,000 Units Subcutaneous Q8H  . multivitamin with minerals  1 tablet Oral Daily  . mupirocin ointment  1 Application Nasal BID  . pantoprazole   40 mg Oral Daily  . rosuvastatin  20 mg Oral Daily  . saccharomyces boulardii  250 mg Oral BID  . sodium chloride flush  3 mL Intravenous Q12H   Continuous Infusions: . sodium chloride    . ceFEPime (MAXIPIME) IV 2 g (10/23/22 1102)  . vancomycin 1,250 mg (10/23/22 1228)   PRN Meds:.sodium chloride, acetaminophen, alum & mag hydroxide-simeth, guaiFENesin-dextromethorphan, hydrALAZINE, iohexol, labetalol, loperamide, metoprolol tartrate, ondansetron, phenol, potassium chloride, sodium chloride flush Medications Prior to Admission:  Prior to Admission medications   Medication Sig Start Date End Date Taking? Authorizing Provider  aspirin EC 81 MG tablet Take 1 tablet (81 mg total) by mouth daily. Swallow whole. Patient taking differently: Take 81 mg by mouth daily. 06/30/21  Yes O'Neal, Ronnald Ramp, MD  diphenhydramine-acetaminophen (TYLENOL PM) 25-500 MG TABS tablet Take 1 tablet by mouth at bedtime as needed (For sleep).   Yes [provider]  loperamide (IMODIUM A-D) 2 MG tablet Take 1 tablet (2 mg total) by mouth 2 (two) times daily as needed for diarrhea or loose stools. 09/28/22  Yes Pokhrel, Laxman, MD  Multiple Vitamins-Minerals (MULTIVITAMIN WITH MINERALS) tablet Take 1 tablet by mouth daily.   Yes [provider]  rosuvastatin (CRESTOR) 20 MG tablet TAKE 1 TABLET(20 MG) BY MOUTH DAILY Patient taking differently: Take 20 mg by mouth daily. 07/09/22  Yes [name], MD   Allergies  Allergen Reactions  . Penicillins Other (See Comments)   Review of Systems  Constitutional:  Positive for fatigue.   Physical Exam Cardiovascular:     Rate and Rhythm: Normal rate.  Pulmonary:     Effort: Pulmonary effort is normal.  Skin:    General: Skin is warm and dry.  Neurological:     Mental Status: He is alert and oriented to person, place, and time.   Vital Signs: BP 114/70 (BP Location: Right Arm)   Pulse 96   Temp 98.2 F (36.8 C) (  Oral)   Resp 20    Wt 75.9 kg   SpO2 99%   BMI 24.71 kg/m  Pain Scale: 0-10   Pain Score: 0-No pain   SpO2: SpO2: 99 % O2 Device:SpO2: 99 % O2 Flow Rate: .   IO: Intake/output summary:  Intake/Output Summary (Last 24 hours) at 10/23/2022 1517 Last data filed at 10/23/2022 1502 Gross per 24 hour  Intake 590 ml  Output --  Net 590 ml    LBM: Last BM Date :  (PTA) Baseline Weight: Weight: 75.9 kg Most recent weight: Weight: 75.9 kg     Palliative Assessment/Data:   Time: 90 minutes    Discussed with bedside RN  Signed by: Lorinda Creed, NP   Please contact Palliative Medicine Team phone at [phone] for questions and concerns.  For individual provider: See Loretha Stapler

## 2022-10-23 NOTE — Progress Notes (Addendum)
Progress Note    10/23/2022 10:35 AM Day of Surgery  Subjective:  upset about surgical options  Vitals:   10/23/22 0434 10/23/22 0724  BP: 130/70 123/63  Pulse:  79  Resp: (!) 21 20  Temp: 98.2 F (36.8 C) 97.8 F (36.6 C)  SpO2: 98% 98%    Physical Exam: Patient is awake and alert Bilateral common femoral arteries are palpable Wound in the midline is packed  CBC    Component Value Date/Time   WBC 13.2 (H) 10/22/2022 1527   RBC 3.91 (L) 10/22/2022 1527   HGB 11.5 (L) 10/22/2022 1527   HGB 13.9 10/27/2020 1038   HCT 32.4 (L) 10/22/2022 1527   HCT 41.0 10/27/2020 1038   PLT 210 10/22/2022 1527   PLT 208 10/27/2020 1038   MCV 82.9 10/22/2022 1527   MCV 90 10/27/2020 1038   MCH 29.4 10/22/2022 1527   MCHC 35.5 10/22/2022 1527   RDW 14.0 10/22/2022 1527   RDW 12.7 10/27/2020 1038   LYMPHSABS 0.3 (L) 09/25/2022 0337   MONOABS 0.8 09/25/2022 0337   EOSABS 0.9 (H) 09/25/2022 0337   BASOSABS 0.1 09/25/2022 0337    BMET    Component Value Date/Time   NA 122 (L) 10/22/2022 1527   NA 138 10/27/2020 1038   K 4.1 10/22/2022 1527   CL 89 (L) 10/22/2022 1527   CO2 22 10/22/2022 1527   GLUCOSE 120 (H) 10/22/2022 1527   BUN 21 10/22/2022 1527   BUN 17 10/27/2020 1038   CREATININE 1.01 10/22/2022 1527   CALCIUM 8.3 (L) 10/22/2022 1527   GFRNONAA >60 10/22/2022 1527   GFRAA >60 07/07/2019 0259    INR    Component Value Date/Time   INR 1.2 08/16/2022 0637    No intake or output data in the 24 hours ending 10/23/22 1035   Assessment/plan:  89 y.o. male is here with worsening infection of his right to left femorofemoral bypass graft.  The abdominal wound aneurysm and lung cancer appears stable from most recent CT scan yesterday.  Patient was upset about the prospect of surgery and losing the femorofemoral bypass graft which could provide an adequate blood flow to the left lower extremity.  I again have discussed this case with all my available partners and this  is a very difficult case and I attempted to convey this to both the patient and his daughter today which I have done in the past as well.  Patient currently not undergoing treatment of his lung cancer due to active infection.  Unfortunately with conservative measures we have been unable to heal his wounds and at this time his only surgical option would be graft excision and possible revascularization of the left external iliac artery with endarterectomy and possibly combination of stenting although given the longstanding occlusion this may not be possible and I would not recommend any further extensive surgery i.e. axillary popliteal or axillary profunda bypass which would complicate the procedure and increase infection risk moving forward.  I also discussed that a large surgery would require endotracheal tube that he may not be able to be extubated given his recent history of pneumonia with underlying malignancy.  Patient visibly frustrated with all of his options has considered seeking care elsewhere which is certainly his option.  He needs hospital medicine evaluation for his underlying medical problems including hyponatremia.  We will also get palliative medicine involved.  Patient chooses to proceed with surgery would not be until later this week discussed with him  and his daughter this is somewhat concerning given the appearance of the graft on most recent CT scan.    In the end they demonstrate good understanding that he will either require surgical intervention to include graft excision or comfort care measures.     C. Randie Heinz, MD [facility] Office: [phone] Pager: [phone]  10/23/2022 10:35 AM   Addendum:  Patient now agreeable to surgery and we will plan for graft excision on Thursday with Dr. Karin Lieu.   C. Randie Heinz, MD

## 2022-10-23 NOTE — Consult Note (Signed)
Initial Consultation Note   Patient: Edward Campos ZOX:[id number] DOB: 06-26-1932 PCP: Patient, No Pcp Per DOA: 10/22/2022 DOS: the patient was seen and examined on 10/23/2022 Primary service: [name]  Referring physician: Dr. Randie Heinz Reason for consult: To assume care  Assessment/Plan: Assessment and Plan:  Sepsis secondary to infected left femoral graft Acute.  On admission patient was noted to be tachycardic with white blood cell count elevated elevated at 13.2.  Reported having fevers up to 101 F prior to arrival.  MRSA screening was positive.  CT noted right bifemoral bypass graft with fluid/inflammatory changes associated with the femorofemoral bypass most prominent on the left compatible with a graft infection.  No initial lactic acid or blood cultures prior to antibiotics.  Patient had been given empiric antibiotics of vancomycin and cefepime.  Vascular surgery planned on removing graft, but patient initially declined needing more time to think about it.  He is now amendable to having the graft removed. -Check blood cultures and lactic acid -Continue empiric antibiotics of vancomycin and cefepime -Probiotics -Vascular surgery, will defer to them as patient now agreeable to  Hyponatremia Acute on chronic.  Sodium 122, but previously had been in the 128-131.  Repeat sodium 126 this morning.  Patient has known small cell lung cancer. -Continue to monitor  Small cell lung cancer with bony metastasis Patient with stage IV lung cancer with assess disease to bone and prior postobstructive pneumonia.  CT scan noting branching masslike opacity measuring approximately 5.2 x 7.9 cm in the left hilar region/central left lower lobe similar in size to previous.  Chemotherapy had not been initiated due to wounds not being healed yet. -Essential monitoring and flutter valve -Palliative care consulted for overall goals of care  Peripheral artery disease -Continue aspirin, Plavix,  statin  Hypokalemia Acute.  Potassium 3.4. -Continue replacement as needed  Elevated liver function test Acute.  Repeat labs noted AST 57, ALT 55, total bilirubin 1.3.  Hyperlipidemia -Continue Crestor, but monitor LFTs  AAA Patient has a 5.8 x 5.7 cm infrarenal abdominal aortic aneurysm that is unchanged in size.     [facility] will continue to follow the patient.  HPI: Edward Campos is a 89 y.o. male with past medical history of HLD, PVD s/p bypass grafting, AAA, SCLC who presented as a direct admission to the vascular service for concern a infected femoral bypass.  Patient had been hospitalized in June for infected right to left femoral to femoral bypass graft with temporary wound VAC placement.  Over the last week he had noticed  increased swelling, pain, bloody/possible pus drainage, reported fevers up to 101 F at home.  Records note he was advised to go to the emergency department for further evaluation, but ended up followed up in the office on 8/12.  Orders were placed to admit the patient directly to the hospital for need of removal of the graft.  Patient was noted to be afebrile with pulse 84-109, and all other vital signs relatively maintained.  Labs from yesterday significant for WBC 13.2, hemoglobin 11.5, sodium 122, calcium, and ALT 46.  MRSA PCR screen was positive.  CT angiogram of the abdomen pelvis noted right bifemoral bypass graft with fluid/inflammatory changes associated with the femorofemoral bypass most prominent on the left compatible with a graft infection, and a 5.8 infrarenal abdominal aortic aneurysm grossly unchanged in size.  Patient had been started on empiric antibiotics of vancomycin and cefepime.  He had been posted for graft excision today.  However, patient  declined having graft removed this morning as he wanted time to think about his options.  He is amendable to having the graft removed at this time.  Vascular surgery asked TRH to assume care as patient due to other  comorbidities.  Patient also reports that he is continue to have a intermittently productive cough since admitted in the hospital back in July with a postobstructive pneumonia.  Denies coughing up any blood.  He has not been able to start chemotherapy due to his wounds not being completely healed.  Review of Systems: As mentioned in the history of present illness. All other systems reviewed and are negative. Past Medical History:  Diagnosis Date   AAA (abdominal aortic aneurysm) (HCC)    Anxiety    Arthritis    BPH (benign prostatic hyperplasia)    Dizziness    Hyperlipidemia    PVD (peripheral vascular disease) (HCC)    S/p fem-fem bypass grafting 08/2022   SCLC (small cell lung carcinoma), left [facility])    Past Surgical History:  Procedure Laterality Date   ABDOMINAL AORTOGRAM W/LOWER EXTREMITY N/A 05/06/2019   Procedure: ABDOMINAL AORTOGRAM W/LOWER EXTREMITY;  Surgeon: Cephus Shelling, MD;  [facility];  Service: Cardiovascular;  Laterality: N/A;   ABDOMINAL WOUND DEHISCENCE N/A 08/22/2022   Procedure: WASHOUT ABDOMEN WOUND AND VAC CHANGE;  Surgeon: Cephus Shelling, MD;  Location: [facility] OR;  Service: Vascular;  Laterality: N/A;   APPLICATION OF WOUND VAC  07/06/2019   Procedure: Application Of Wound Vac Left Groin;  Surgeon: Sherren Kerns, MD;  Location: [facility] OR;  Service: Vascular;;   APPLICATION OF WOUND VAC N/A 08/16/2022   Procedure: APPLICATION OF WOUND VAC;  Surgeon: [name], MD;  Location[facility] OR;  Service: Vascular;  Laterality: N/A;   BRONCHIAL BIOPSY  08/21/2022   Procedure: BRONCHIAL BIOPSIES;  Surgeon: Oretha Milch, MD;  Location: [facility] ENDOSCOPY;  Service: Cardiopulmonary;;   BRONCHIAL BRUSHINGS  08/21/2022   Procedure: BRONCHIAL BRUSHINGS;  Surgeon: Oretha Milch, MD;  Location: [facility] ENDOSCOPY;  Service: Cardiopulmonary;;   BRONCHIAL WASHINGS  08/21/2022   Procedure: BRONCHIAL WASHINGS;  Surgeon: Oretha Milch, MD;  Location: [facility]  ENDOSCOPY;  Service: Cardiopulmonary;;   CARDIOVERSION N/A 11/03/2020   Procedure: CARDIOVERSION;  Surgeon: Thurmon Fair, MD;  [facility]: [facility] ENDOSCOPY;  Service: Cardiovascular;  Laterality: N/A;   ENDARTERECTOMY FEMORAL Bilateral 06/08/2019   Procedure: ENDARTERECTOMY RIGHT COMMON FEMORAL; ENDARTERECTOMY RIGHT EXTERNAL ILIAC; ENDARTERECTOMY LEFT COMMON FEMORAL; ENDARTERECTOMY LEFT EXTERNAL ILIAC;  Surgeon: Sherren Kerns, MD;  Location: [facility] OR;  Service: Vascular;  Laterality: Bilateral;   FEMORAL-FEMORAL BYPASS GRAFT Bilateral 06/08/2019   Procedure: BYPASS GRAFT FEMORAL-FEMORAL ARTERY;  Surgeon: Sherren Kerns, MD;  Location[facility] OR;  Service: Vascular;  Laterality: Bilateral;   GROIN DEBRIDEMENT Left 07/06/2019   Procedure: INCISION AND DRAINAGE OF LEFT GROIN;  Surgeon: Sherren Kerns, MD;  Location: [facility] OR;  Service: Vascular;  Laterality: Left;   growth removed from chest     HEMORRHOID SURGERY     HEMOSTASIS CONTROL  08/21/2022   Procedure: HEMOSTASIS CONTROL;  Surgeon: Oretha Milch, MD;  Location: [facility] ENDOSCOPY;  Service: Cardiopulmonary;;   HERNIA REPAIR  12 yrs ago   left inguinal hernia   INCISE AND DRAIN ABCESS Left 07/06/2019   Incision and drainage left groin placement of VAC dressing   INCISION AND DRAINAGE OF WOUND Right 08/16/2022   Procedure: IRRIGATION AND DEBRIDEMENT WOUND;  Surgeon: [name], MD;  Location:  [facility] OR;  Service: Vascular;  Laterality: Right;   INGUINAL HERNIA REPAIR  10/16/2010   Procedure: HERNIA REPAIR INGUINAL ADULT;  Surgeon: Dalia Heading;  Location: [facility] ORS;  Service: General;  Laterality: Right;   INSERTION OF ILIAC STENT Right 06/08/2019   Procedure: INSERTION OF RIGHT COMMON ILIAC AND EXTERNAL ILICAC STENT;  Surgeon: Sherren Kerns, MD;  Location: [facility] OR;  Service: Vascular;  Laterality: Right;   PATCH ANGIOPLASTY Right 06/08/2019   Procedure: Patch Angioplasty Right Common Femoral;  Surgeon: Sherren Kerns, MD;  Location: [facility] OR;   Service: Vascular;  Laterality: Right;   VIDEO BRONCHOSCOPY N/A 08/21/2022   Procedure: VIDEO BRONCHOSCOPY WITH FLUORO;  Surgeon: Oretha Milch, MD;  Location: [facility] ENDOSCOPY;  Service: Cardiopulmonary;  Laterality: N/A;   Social History:  reports that he quit smoking about 3 years ago. His smoking use included cigarettes. He started smoking about 73 years ago. He has a 105 pack-year smoking history. He has never used smokeless tobacco. He reports current alcohol use of about 3.0 standard drinks of alcohol per week. He reports that he does not use drugs.  Allergies  Allergen Reactions   Penicillins Other (See Comments)    Family History  Problem Relation Age of Onset   Stroke Mother    Pseudochol deficiency Neg Hx    Malignant hyperthermia Neg Hx    Hypotension Neg Hx    Anesthesia problems Neg Hx     Prior to Admission medications   Medication Sig Start Date End Date Taking? Authorizing Provider  aspirin EC 81 MG tablet Take 1 tablet (81 mg total) by mouth daily. Swallow whole. Patient taking differently: Take 81 mg by mouth daily. 06/30/21  Yes O'Neal, Ronnald Ramp, MD  diphenhydramine-acetaminophen (TYLENOL PM) 25-500 MG TABS tablet Take 1 tablet by mouth at bedtime as needed (For sleep).   Yes [provider]  loperamide (IMODIUM A-D) 2 MG tablet Take 1 tablet (2 mg total) by mouth 2 (two) times daily as needed for diarrhea or loose stools. 09/28/22  Yes Pokhrel, Laxman, MD  Multiple Vitamins-Minerals (MULTIVITAMIN WITH MINERALS) tablet Take 1 tablet by mouth daily.   Yes [provider]  rosuvastatin (CRESTOR) 20 MG tablet TAKE 1 TABLET(20 MG) BY MOUTH DAILY Patient taking differently: Take 20 mg by mouth daily. 07/09/22  Yes [name], MD    Physical Exam: Vitals:   10/23/22 0431 10/23/22 0434 10/23/22 0724 10/23/22 1109  BP:  130/70 123/63 (!) 152/69  Pulse:   79 100  Resp:  (!) 21 20 20   Temp:  98.2 F (36.8 C) 97.8 F (36.6 C) 98.1 F  (36.7 C)  TempSrc:  Oral Oral Oral  SpO2:  98% 98% 97%  Weight: 75.9 kg       Constitutional: Elderly male currently no acute distress. Eyes: PERRL, lids and conjunctivae normal ENMT: Mucous membranes are moist.   Neck: normal, supple  Respiratory: Decreased aeration noted on the left lung field.  O2 saturation currently maintained on room air. Cardiovascular: Regular rate and rhythm, no murmurs / rubs / gallops. No extremity edema. 2+ pedal pulses. No carotid bruits.  Abdomen: no tenderness, no masses palpated. No hepatosplenomegaly. Bowel sounds positive.  Musculoskeletal: no clubbing / cyanosis. No joint deformity upper and lower extremities. Good ROM, no contractures. Normal muscle tone.  Skin: Wound noted of the right groin with some mild surrounding erythema. Neurologic: CN 2-12 grossly intact. Sensation intact, DTR normal. Strength 5/5 in all 4.  Psychiatric:  Normal judgment and insight. Alert and oriented x 3. Normal mood.   Data Reviewed:   Repeat labs note sodium 126, potassium 3.4, AST 57, ALT 55, total bilirubin 1.3.   Family Communication: Daughter updated at bedside Primary team communication:   Thank you very much for involving [place] in the care of your patient.  Author: Clydie Braun, MD 10/23/2022 11:12 AM  For on call review [email or web address].

## 2022-10-23 NOTE — Plan of Care (Signed)
  Problem: Education: Goal: Knowledge of General Education information will improve Description: Including pain rating scale, medication(s)/side effects and non-pharmacologic comfort measures Outcome: Progressing   Problem: Safety: Goal: Ability to remain free from injury will improve Outcome: Progressing   

## 2022-10-23 NOTE — Progress Notes (Signed)
Pharmacy Antibiotic Note  Edward Campos is a 89 y.o. male admitted on 10/22/2022 with an infected femorofemoral bypass graft and for possible surgery later this week.  Pharmacy has been consulted for vancomycin dosing (he is also on cefepime) -WBC= 13.2, SCr= 1.01, afebrile  Plan -Vancomycin 1250mg  IV q12h (estimated AUC 480) -Will follow renal function and clinical progress    Weight: 75.9 kg (167 lb 5.3 oz)  Temp (24hrs), Avg:98.5 F (36.9 C), Min:97.8 F (36.6 C), Max:99.2 F (37.3 C)  Recent Labs  Lab 10/22/22 1527  WBC 13.2*  CREATININE 1.01    Estimated Creatinine Clearance: 51.5 mL/min (by C-G formula based on SCr of 1.01 mg/dL).    Allergies  Allergen Reactions   Penicillins Other (See Comments)    Antimicrobials this admission: 8/13 cefepime>> 8/13 vanc  Dose adjustments this admission:  Microbiology results: 8/13 MRSA PCR- positive  Thank you for allowing pharmacy to be a part of this patient's care.  Harland German, PharmD Clinical Pharmacist **Pharmacist phone directory can now be found on [email or web address] ([email or web address]).  Listed under [facility].

## 2022-10-24 DIAGNOSIS — E871 Hypo-osmolality and hyponatremia: Secondary | ICD-10-CM | POA: Insufficient documentation

## 2022-10-24 DIAGNOSIS — C3432 Malignant neoplasm of lower lobe, left bronchus or lung: Secondary | ICD-10-CM | POA: Diagnosis not present

## 2022-10-24 DIAGNOSIS — T827XXD Infection and inflammatory reaction due to other cardiac and vascular devices, implants and grafts, subsequent encounter: Secondary | ICD-10-CM

## 2022-10-24 DIAGNOSIS — T827XXA Infection and inflammatory reaction due to other cardiac and vascular devices, implants and grafts, initial encounter: Secondary | ICD-10-CM | POA: Diagnosis not present

## 2022-10-24 DIAGNOSIS — I739 Peripheral vascular disease, unspecified: Secondary | ICD-10-CM | POA: Insufficient documentation

## 2022-10-24 DIAGNOSIS — E876 Hypokalemia: Secondary | ICD-10-CM | POA: Insufficient documentation

## 2022-10-24 DIAGNOSIS — I714 Abdominal aortic aneurysm, without rupture, unspecified: Secondary | ICD-10-CM | POA: Diagnosis not present

## 2022-10-24 DIAGNOSIS — A419 Sepsis, unspecified organism: Secondary | ICD-10-CM

## 2022-10-24 DIAGNOSIS — Z515 Encounter for palliative care: Secondary | ICD-10-CM | POA: Diagnosis not present

## 2022-10-24 DIAGNOSIS — R7401 Elevation of levels of liver transaminase levels: Secondary | ICD-10-CM | POA: Diagnosis present

## 2022-10-24 HISTORY — DX: Sepsis, unspecified organism: A41.9

## 2022-10-24 LAB — COMPREHENSIVE METABOLIC PANEL
ALT: 89 U/L — ABNORMAL HIGH (ref 0–44)
AST: 85 U/L — ABNORMAL HIGH (ref 15–41)
Albumin: 2.6 g/dL — ABNORMAL LOW (ref 3.5–5.0)
Alkaline Phosphatase: 81 U/L (ref 38–126)
Anion gap: 11 (ref 5–15)
BUN: 20 mg/dL (ref 8–23)
CO2: 23 mmol/L (ref 22–32)
Calcium: 8.4 mg/dL — ABNORMAL LOW (ref 8.9–10.3)
Chloride: 92 mmol/L — ABNORMAL LOW (ref 98–111)
Creatinine, Ser: 0.99 mg/dL (ref 0.61–1.24)
GFR, Estimated: >60 mL/min (ref >60)
Glucose, Bld: 129 mg/dL — ABNORMAL HIGH (ref 70–99)
Potassium: 3.2 mmol/L — ABNORMAL LOW (ref 3.5–5.1)
Sodium: 126 mmol/L — ABNORMAL LOW (ref 135–145)
Total Bilirubin: 0.9 mg/dL (ref 0.3–1.2)
Total Protein: 6 g/dL — ABNORMAL LOW (ref 6.5–8.1)

## 2022-10-24 LAB — CBC WITH DIFFERENTIAL/PLATELET
Abs Immature Granulocytes: 0.17 10*3/uL — ABNORMAL HIGH (ref 0.00–0.07)
Basophils Absolute: 0.1 10*3/uL (ref 0.0–0.1)
Basophils Relative: 0 %
Eosinophils Absolute: 0 10*3/uL (ref 0.0–0.5)
Eosinophils Relative: 0 %
HCT: 35.3 % — ABNORMAL LOW (ref 39.0–52.0)
Hemoglobin: 12.6 g/dL — ABNORMAL LOW (ref 13.0–17.0)
Immature Granulocytes: 1 %
Lymphocytes Relative: 7 %
Lymphs Abs: 1 10*3/uL (ref 0.7–4.0)
MCH: 29.6 pg (ref 26.0–34.0)
MCHC: 35.7 g/dL (ref 30.0–36.0)
MCV: 83.1 fL (ref 80.0–100.0)
Monocytes Absolute: 1.3 10*3/uL — ABNORMAL HIGH (ref 0.1–1.0)
Monocytes Relative: 9 %
Neutro Abs: 12 10*3/uL — ABNORMAL HIGH (ref 1.7–7.7)
Neutrophils Relative %: 83 %
Platelets: 278 10*3/uL (ref 150–400)
RBC: 4.25 MIL/uL (ref 4.22–5.81)
RDW: 13.8 % (ref 11.5–15.5)
WBC: 14.5 10*3/uL — ABNORMAL HIGH (ref 4.0–10.5)
nRBC: 0 % (ref 0.0–0.2)

## 2022-10-24 LAB — MAGNESIUM: Magnesium: 1.9 mg/dL (ref 1.7–2.4)

## 2022-10-24 MED ORDER — DIPHENHYDRAMINE HCL 25 MG PO CAPS: 25.0000 mg | ORAL_CAPSULE | Freq: Every evening | ORAL | Status: DC | PRN

## 2022-10-24 MED ORDER — POTASSIUM CHLORIDE CRYS ER 20 MEQ PO TBCR
60.0000 meq | EXTENDED_RELEASE_TABLET | Freq: Once | ORAL | Status: AC
  Administered 2022-10-24: 60 meq via ORAL
  Filled 2022-10-24: qty 3

## 2022-10-24 MED ORDER — BENZONATATE 100 MG PO CAPS: 100.0000 mg | ORAL_CAPSULE | Freq: Three times a day (TID) | ORAL | Status: DC | PRN

## 2022-10-24 NOTE — Progress Notes (Signed)
Patient ID: Edward Campos, male   DOB: January 02, 1933, 89 y.o.   MRN: [id number]    Progress Note from the Palliative Medicine Team at [facility]   Patient Name: Edward Campos        Date: 10/24/2022 DOB: 1932-05-03  Age: 89 y.o. MRN#: [id number] Attending Physician: Rodolph Bong, MD Primary Care Physician: Patient, No Pcp Per Admit Date: 10/22/2022    Extensive chart review has been completed prior to meeting with patient/family  including labs, vital signs, imaging, progress/consult notes, orders, medications and available advance directive documents.   89 y.o. male   admitted on 10/22/2022 with history of a right to left femorofemoral bypass with common and external iliac artery stenting on the right with a known abdominal aortic aneurysm.     Complex vascular history  06/08/2019: Originally Dr. Darrick Penna performed Right common femoral external iliac endarterectomy, right common (9 x 39 VBX) and external iliac stent 8 x 59 VBX, 8 x 40 Inova, right to left femoral-femoral bypass, left external iliac common femoral endarterectomy, patch angioplasty right common femoral artery Dacron for severe claudication symptoms.     07/06/19: Incision and drainage left groin placement of VAC dressing for Clear fluid seroma under pressure proximally 20 cc.     08/16/22:Dr. Randie Heinz performed I & D infected graft.  A wound vac was placed post op and he received 2 weeks of antibiotics.     08/22/22:  Dr. Chestine Spore performed Procedure: 1.  Irrigation and debridement with VAC change of wound overlying femoral-femoral bypass graft 2.  Application of 38 cm skin substitute using Kerecis to wound overlying femoral-femoral bypass ([id number])   Patient  recently  diagnosed with small cell lung cancer/Dr Elbert Ewings at [facility].      Patient was admitted 87/2 to worsening infection of his Right to Left femorofemoral bypass graft.   Patient understanding the risks of surgery agreed to move forward        This NP assessed patient at the bedside as a follow up to  yesterday's GOCs meeting.  Patient's daughter is at bedside.  Patient verbalizes fear and anxiety as he anticipates surgery tomorrow.   Ultimately he is hopeful for improved quality of life.  Emotional support offered.  Patient declined spiritual care support  Education offered today regarding  the importance of continued conversation with family and their  medical providers regarding overall plan of care and treatment options,  ensuring decisions are within the context of the patients values and GOCs.  Questions and concerns addressed     Information given to patient and family regarding postmortem body donation that he requested.   Time: 25 minutes  Detailed review of medical records ( labs, imaging, vital signs), medically appropriate exam ( MS, skin, resp)   discussed with treatment team, counseling and education to patient, family, staff, documenting clinical information, medication management, coordination of care    Lorinda Creed NP  Palliative Medicine Team Team Phone # [phone] Pager [phone]

## 2022-10-24 NOTE — TOC Initial Note (Signed)
Transition of Care (TOC) - Initial/Assessment Note  Donn Pierini RN, BSN Transitions of Care Unit 4E- RN Case Manager See Treatment Team for direct phone #   Patient Details  Name: Edward Campos MRN: [id number] Date of Birth: 02-18-1933  Transition of Care [facility]) CM/SW Contact:    Darrold Span, RN Phone Number: 10/24/2022, 2:05 PM  Clinical Narrative:                 CM spoke with pt and daughter at the bedside [facility]), discussed pt's HH needs.  Pt lives at home alone, is active with [facility]- wound care needs- pt voiced that he has been getting nursing visits 2x week- with family assisting as well- however they do not live locally. Pt is having difficulty with dressing changes himself as he has no one else to assist. Pt voiced he would like more HH visits- CM explained that Medicare does not cover daily HH visits, and agency can not provide that. Pt also voiced that he has a long term care policy that he has tried to access- (has  90 day wait period)- but so far has not had any luck, only a lot of paperwork and emails back and forth.  Family to check into policy to see what they can find out.  Pt and family asking about "rehab/SNF" option on discharge- pt for OR procedure possibly tomorrow. Explained that therapy would need to assess pt for recommendations but that TOC could f/u for that option. Pt is agreeable to look into rehab for wound care needs and therapy if recommended.   Pt has DME at home, has had 79M home wound VAC in past- at present [facility] has been returned- pt would need new order and auth if VAC is needed again.   TOC to follow up on plan post procedure.   Expected Discharge Plan: Home w Home Health Services Barriers to Discharge: Continued Medical Work up   Patient Goals and CMS Choice Patient states their goals for this hospitalization and ongoing recovery are:: pt wants to get better and be able to stay at home CMS Medicare.gov Compare Post Acute Care  list provided to:: Patient Choice offered to / list presented to : Patient      Expected Discharge Plan and Services   Discharge Planning Services: CM Consult Post Acute Care Choice: Home Health, Resumption of Svcs/PTA Provider, Skilled Nursing Facility Living arrangements for the past 2 months: Single Family Home                             HH Agency: [facility]        Prior Living Arrangements/Services Living arrangements for the past 2 months: Single Family Home Lives with:: Self Patient language and need for interpreter reviewed:: Yes        Need for Family Participation in Patient Care: Yes (Comment) Care giver support system in place?: Yes (comment) Current home services: Home RN Criminal Activity/Legal Involvement Pertinent to Current Situation/Hospitalization: No - Comment as needed  Activities of Daily Living      Permission Sought/Granted                  Emotional Assessment Appearance:: Appears stated age Attitude/Demeanor/Rapport: Engaged Affect (typically observed): Appropriate, Overwhelmed Orientation: : Oriented to Self, Oriented to Place, Oriented to  Time, Oriented to Situation Alcohol / Substance Use: Not Applicable Psych Involvement: No (comment)  Admission diagnosis:  Infection [B99.9] Patient Active Problem List   Diagnosis Date Noted   Infection 10/22/2022   Shock (HCC) 09/25/2022   Pneumonia 09/06/2022   Small cell lung cancer, left lower lobe (HCC) 08/30/2022   Mass of left lung 08/21/2022   Infection of vascular bypass graft (HCC) 08/16/2022   Typical atrial flutter (HCC)    Non-healing left groin open wound, initial encounter 07/06/2019   Status post insertion of iliac artery stent 06/08/2019   Peripheral vascular disease (HCC) 06/08/2019   PCP:  Patient, No Pcp Per Pharmacy:   [id number] - [place], [place] - [address] [place] [address] Phone: [phone] Fax: [phone]  [facility] [phone] - [place], [place] - [address] AT [facility] [address] [place] [address] Phone: [phone] Fax: [phone]     Social Determinants of Health (SDOH) Social History: SDOH Screenings   Food Insecurity: No Food Insecurity (09/25/2022)  Housing: Low Risk  (09/25/2022)  Transportation Needs: No Transportation Needs (08/20/2022)  Utilities: Not At Risk (08/20/2022)  Tobacco Use: Medium Risk (10/23/2022)   SDOH Interventions:     Readmission Risk Interventions     No data to display

## 2022-10-24 NOTE — Consult Note (Signed)
[facility] Customer service manager [facility]) Accountable Care Organization (ACO) [facility] Liaison Note  10/24/2022  Edward Campos [birth year] [id number]  Location: [facility] Liaison met patient at bedside at [facility].  Insurance:  Medicare   [name] is a 89 y.o. male who is a Primary Care Patient of Patient, No Pcp Per. The patient was screened for 30 day readmission hospitalization with noted medium risk score for unplanned readmission risk with 2 IP/1 ED in 6 months.  The patient was assessed for potential [facility]) Care Management service needs for post hospital transition for care coordination. Review of patient's electronic medical record reveals patient was admitted with Infection of vascular bypass graft. Liaison spoke with pt at bedside along with his two daughters Kendal Hymen and Neysa Bonito) educated on care management services and benefits of services.  Liaison verified pt is not followed closely by oncology team as daughter indicates treatment is on hold at this time. Daughter indicates pt has established a new primary care provider Dr. Dwana Melena scheduled for his initial appointment on 11/14/2022. Based upon this information daughters are receptive to a referral  for community care coordinator to assist with post hospital prevention readmission follow up call. Liaison will make a referral upon pt's discharge disposition if pt is discharged to his home residence however if SNF considered will refer to PAC-RN if it is a facility agency follows.   Plan: [facility] Liaison will continue to follow progress and disposition to asess for post hospital community care coordination/management needs.  Referral request for community care coordination: pending disposition.   [facility] Care Management/Population Health does not replace or interfere with any arrangements made by the Inpatient Transition of Care team.   For questions contact:   Elliot Cousin, RN, [facility]  Liaison [place]   Population Health Office Hours MTWF  8:00 am-6:00 pm Off on Thursday [phone] mobile [phone] [Office toll free line] Office Hours are M-F 8:30 - 5 pm [email or web address]

## 2022-10-24 NOTE — Progress Notes (Addendum)
[facility]  Subjective  - Ok to proceed    Objective 126/79 85 97.7 F (36.5 C) (Oral) (!) 21 95%  Intake/Output Summary (Last 24 hours) at 10/24/2022 0816 Last data filed at 10/24/2022 0230 Gross per 24 hour  Intake 790 ml  Output --  Net 790 ml   Central pelvic incisional/graft infection Heart RRR Lungs non labored breathing   Assessment/Planning: 89 y.o. male is here with worsening infection of his right to left femorofemoral bypass graft.   NPO past MN  Consent on chart He agrees to proceed   Edward Campos 10/24/2022 8:16 AM --  Laboratory Lab Results: Recent Labs    10/22/22 1527 10/23/22 1224  WBC 13.2* 12.0*  HGB 11.5* 11.6*  HCT 32.4* 32.2*  PLT 210 213   BMET Recent Labs    10/22/22 1527 10/23/22 1224  NA 122* 126*  K 4.1 3.4*  CL 89* 90*  CO2 22 23  GLUCOSE 120* 157*  BUN 21 21  CREATININE 1.01 0.93  CALCIUM 8.3* 8.2*    COAG Lab Results  Component Value Date   INR 1.2 08/16/2022   INR 1.1 06/04/2019   No results found for: "PTT"  I have independently interviewed and examined patient and agree with PA assessment and plan above.  Appreciate hospital medicine care of patient and palliative care involvement.  Plan for graft excision tomorrow with Dr. Karin Lieu in the OR.   C. Randie Heinz, MD [facility] Office: [phone] Pager: [phone]

## 2022-10-24 NOTE — Progress Notes (Signed)
PROGRESS NOTE    Edward Campos  VHQ:[id number] DOB: [birth year] DOA: 10/22/2022 PCP: Patient, No Pcp Per   No chief complaint on file.   Brief Narrative:  Dorien Denno is a 89 y.o. male with past medical history of HLD, PVD s/p bypass grafting, AAA, SCLC who presented as a direct admission to the vascular service for concern a infected femoral bypass.  Patient had been hospitalized in June for infected right to left femoral to femoral bypass graft with temporary wound VAC placement.  Over the last week he had noticed  increased swelling, pain, bloody/possible pus drainage, reported fevers up to 101 F at home.  Records note he was advised to go to the emergency department for further evaluation, but ended up followed up in the office on 8/12.  Orders were placed to admit the patient directly to the hospital for need of removal of the graft.  Patient was noted to be afebrile with pulse 84-109, and all other vital signs relatively maintained.  Labs from yesterday significant for WBC 13.2, hemoglobin 11.5, sodium 122, calcium, and ALT 46.  MRSA PCR screen was positive.  CT angiogram of the abdomen pelvis noted right bifemoral bypass graft with fluid/inflammatory changes associated with the femorofemoral bypass most prominent on the left compatible with a graft infection, and a 5.8 infrarenal abdominal aortic aneurysm grossly unchanged in size.  Patient had been started on empiric antibiotics of vancomycin and cefepime.  He had been posted for graft excision today.  However, patient declined having graft removed this morning as he wanted time to think about his options.  He is amendable to having the graft removed at this time.  Vascular surgery asked [facility] to assume care as patient due to other comorbidities.   Patient also reports that he is continue to have a intermittently productive cough since admitted in the hospital back in July with a postobstructive pneumonia.  Denies coughing up any blood.  He has  not been able to start chemotherapy due to his wounds not being completely healed.   Assessment & Plan:   Principal Problem:   Infection of vascular bypass graft (HCC) Active Problems:   Sepsis (HCC)   Peripheral vascular disease (HCC)   Small cell lung cancer, left lower lobe (HCC)   Hypokalemia   Transaminitis   Hyponatremia   AAA (abdominal aortic aneurysm) (HCC)   PAD (peripheral artery disease) (HCC)  #1 sepsis secondary to infected left femoral graft, POA -Patient met criteria for sepsis on admission with tachycardia, leukocytosis, fevers of 101 Fahrenheit prior to admission. -MRSA screening noted to be positive. -Continue angiogram abdomen and pelvis noted right bifemoral bypass graft with fluid/inflammatory changes associated with femoral-femoral bypass most prominent on the left compatible with graft infection. -Blood cultures and lactic acid levels obtained and pending. -Patient noted to have received IV Vanco and cefepime prior to blood cultures being drawn. -Patient seen by vascular surgery who initially had planned on removing graft, patient noted to have initially declined needing more time to think about it and patient now amenable for graft removal. -Continue empiric IV vancomycin, IV cefepime. -Probiotics. -Per vascular surgery hopefully patient is scheduled for tomorrow morning.  2.  Acute on chronic hyponatremia -Baseline sodium 1 28-1 31. -On initial presentation sodium noted at 122 with repeat at 126. -Patient with history of small cell lung cancer. -Sodium currently at 126.  3.  Hypokalemia -Magnesium noted at 1.9. -Replete.  4.  Small cell lung cancer with bone metastases -Patient noted  stage IV lung cancer with disease to the bone and prior postobstructive pneumonia. -CT scan done with branching masslike opacity measuring 5.2 x 7.9 cm in the left hilar region/central left lower lobe similar in size to prior. -Chemotherapy noted not to have been  initiated due to wounds not being healed yet. -Palliative care consulted for goals of care. -Outpatient follow-up with primary oncologist.  5.  PAD -Continue aspirin, Plavix, statin.  6.  Transaminitis -??  Etiology. -CT angiogram abdomen and pelvis with no acute abnormalities noted in the left. -LFTs trending up, -DC Crestor. -Check an acute hepatitis panel.  7.  Hyperlipidemia -Discontinue Crestor due to rising LFTs.  8.  AAA -Patient with 5.8 x 5.7 cm infrarenal abdominal aortic aneurysm unchanged in size. -Vascular surgery following.     DVT prophylaxis: Heparin Code Status: Full Family Communication: Updated patient, daughters at bedside. Disposition: TBD  Status is: Inpatient Remains inpatient appropriate because: Severity of illness   Consultants:  Palliative care: Lorinda Creed, NP 10/24/2022 [facility] hospitalist: Dr. Katrinka Blazing 10/23/2022 Vascular surgery  Procedures:  CT angiogram abdomen and pelvis 10/22/2022   Antimicrobials: Anti-infectives (From admission, onward)    Start     Dose/Rate Route Frequency Ordered Stop   10/23/22 1145  vancomycin (VANCOREADY) IVPB 1250 mg/250 mL        1,250 mg 166.7 mL/hr over 90 Minutes Intravenous Every 24 hours 10/23/22 1051     10/23/22 1100  ceFEPIme (MAXIPIME) 2 g in sodium chloride 0.9 % 100 mL IVPB        2 g 200 mL/hr over 30 Minutes Intravenous Every 12 hours 10/23/22 1041           Subjective: Patient laying in bed.  Daughters at bedside.  Denies any chest pain.  No significant worsening chronic shortness of breath.  No significant abdominal pain.  Stated some drainage noted around wound on the midline which is packed.  Stated had some loose stools yesterday and amount of loose stool has improved.  Felt associated with antibiotics.  Objective: Vitals:   10/24/22 0313 10/24/22 0830 10/24/22 1136 10/24/22 1607  BP: 126/79 (!) 135/95 113/70 137/89  Pulse: 85 91 79 85  Resp: (!) 21 15 18 18   Temp: 97.7 F (36.5  C) 98.4 F (36.9 C) 97.8 F (36.6 C) (!) 97.2 F (36.2 C)  TempSrc: Oral Oral Oral Axillary  SpO2: 95% 96% 100% 92%  Weight:        Intake/Output Summary (Last 24 hours) at 10/24/2022 1639 Last data filed at 10/24/2022 1145 Gross per 24 hour  Intake 680 ml  Output --  Net 680 ml   Filed Weights   10/23/22 0431  Weight: 75.9 kg    Examination:  General exam: Appears calm and comfortable  Respiratory system: Clear to auscultation.  No wheezes, no crackles, no rhonchi.  Fair air movement.  Speaking in full sentences.  Respiratory effort normal. Cardiovascular system: S1 & S2 heard, RRR. No JVD, murmurs, rubs, gallops or clicks. No pedal edema. Gastrointestinal system: Abdomen is nondistended, soft and nontender. No organomegaly or masses felt. Normal bowel sounds heard.  Midline dressing noted to have some drainage. Central nervous system: Alert and oriented. No focal neurological deficits. Extremities: Symmetric 5 x 5 power. Skin: No rashes, lesions or ulcers Psychiatry: Judgement and insight appear normal. Mood & affect appropriate.     Data Reviewed: I have personally reviewed following labs and imaging studies  CBC: Recent Labs  Lab 10/22/22 1527 10/23/22 1224 10/24/22  0940  WBC 13.2* 12.0* 14.5*  NEUTROABS  --   --  12.0*  HGB 11.5* 11.6* 12.6*  HCT 32.4* 32.2* 35.3*  MCV 82.9 84.3 83.1  PLT 210 213 278    Basic Metabolic Panel: Recent Labs  Lab 10/22/22 1527 10/23/22 1224 10/24/22 0940  NA 122* 126* 126*  K 4.1 3.4* 3.2*  CL 89* 90* 92*  CO2 22 23 23   GLUCOSE 120* 157* 129*  BUN 21 21 20   CREATININE 1.01 0.93 0.99  CALCIUM 8.3* 8.2* 8.4*  MG  --   --  1.9    GFR: Estimated Creatinine Clearance: 52.6 mL/min (by C-G formula based on SCr of 0.99 mg/dL).  Liver Function Tests: Recent Labs  Lab 10/22/22 1527 10/23/22 1224 10/24/22 0940  AST 41 57* 85*  ALT 46* 55* 89*  ALKPHOS 77 78 81  BILITOT 1.1 1.3* 0.9  PROT 6.0* 5.8* 6.0*  ALBUMIN  2.9* 2.6* 2.6*    CBG: No results for input(s): "GLUCAP" in the last 168 hours.   Recent Results (from the past 240 hour(s))  Surgical pcr screen     Status: Abnormal   Collection Time: 10/23/22  7:44 AM   Specimen: Nasal Mucosa; Nasal Swab  Result Value Ref Range Status   MRSA, PCR POSITIVE (A) NEGATIVE Final    Comment: RESULT CALLED TO, READ BACK BY AND VERIFIED WITH: RN MIA RINAL [id number] AT 1029 BY EC    Staphylococcus aureus POSITIVE (A) NEGATIVE Final    Comment: (NOTE) The Xpert SA Assay (FDA approved for NASAL specimens in patients 71 years of age and older), is one component of a comprehensive surveillance program. It is not intended to diagnose infection nor to guide or monitor treatment. Performed at [facility], [address], [place], [place] [address]   Culture, blood (Routine X 2) w Reflex to ID Panel     Status: None (Preliminary result)   Collection Time: 10/23/22 12:24 PM   Specimen: BLOOD  Result Value Ref Range Status   Specimen Description BLOOD RIGHT ANTECUBITAL  Final   Special Requests Blood Culture adequate volume  Final   Culture   Final    NO GROWTH < 24 HOURS Performed at [facility], [address] [place], [place] [address]    Report Status PENDING  Incomplete  Culture, blood (Routine X 2) w Reflex to ID Panel     Status: None (Preliminary result)   Collection Time: 10/23/22 12:24 PM   Specimen: BLOOD  Result Value Ref Range Status   Specimen Description BLOOD BLOOD RIGHT FOREARM  Final   Special Requests   Final    BOTTLES DRAWN AEROBIC AND ANAEROBIC Blood Culture adequate volume   Culture   Final    NO GROWTH < 24 HOURS Performed at [facility], [address] [place], [place] [address]    Report Status PENDING  Incomplete         Radiology Studies: CT Angio Abd/Pel w/ and/or w/o  Result Date: 10/22/2022 CLINICAL DATA:  Bypass infection, small cell lung cancer EXAM: CTA ABDOMEN AND PELVIS WITHOUT AND  WITH CONTRAST TECHNIQUE: Multidetector CT imaging of the abdomen and pelvis was performed using the standard protocol during bolus administration of intravenous contrast. Multiplanar reconstructed images and MIPs were obtained and reviewed to evaluate the vascular anatomy. RADIATION DOSE REDUCTION: This exam was performed according to the departmental dose-optimization program which includes automated exposure control, adjustment of the mA and/or kV  according to patient size and/or use of iterative reconstruction technique. CONTRAST:  75mL OMNIPAQUE IOHEXOL 350 MG/ML SOLN COMPARISON:  CTA chest dated 09/24/2022. PET-CT dated 09/06/2022. CTA abdomen/pelvis dated 08/16/2022. FINDINGS: VASCULAR Aorta: 5.8 x 5.7 cm infrarenal abdominal aortic aneurysm (series 5/image 56), grossly unchanged. Patent. Atherosclerotic calcifications. Celiac: Patent.  Atherosclerotic calcifications at the origin. SMA: Patent.  Atherosclerotic calcifications at the origin. Renals: Patent bilaterally. IMA: Poorly visualized/evaluated at the origin, but patent. Inflow: Common iliac arteries are patent, with atherosclerotic calcifications. Right external iliac artery stent, patent. Right internal iliac artery is diminutive. Left external iliac artery is occluded, chronic. Left internal iliac artery is diminutive. Proximal Outflow: Right bifemoral bypass graft with fluid/inflammatory changes associated with the fem-fem bypass, most prominent on the left (series 5/image 125). Additional prominent fluid/inflammatory changes along the left common femoral artery (series 5/image 139). Veins: Grossly unremarkable. Review of the MIP images confirms the above findings. NON-VASCULAR Lower chest: Branching masslike opacity measuring approximately 5.2 x 7.9 cm in the left hilar region/central left lower lobe (series 5/image 4), with additional branching left lower lobe opacities (series 5/images 8 and 19), similar to the prior although with improved tumor  along the medial left lower lobe (series 5/image 20). Hepatobiliary: Liver is within normal limits. Gallbladder is decompressed, with tiny under gallstones (series 5/image 51), without associated inflammatory changes. No intrahepatic or extrahepatic duct dilatation. Pancreas: Within normal limits Spleen: Within normal limits. Adrenals/Urinary Tract: Adrenal glands are within normal limits. Kidneys are within normal limits.  No hydronephrosis. Bladder is underdistended but unremarkable. Stomach/Bowel: Stomach is within normal limits. No evidence of bowel obstruction. Appendix is not discretely visualized. No colonic wall thickening or inflammatory changes. Lymphatic: No suspicious abdominopelvic lymphadenopathy. Reproductive: Prostate is grossly unremarkable. Other: No abdominopelvic ascites. Musculoskeletal: Degenerative changes of the visualized thoracolumbar spine. IMPRESSION: Right bifemoral bypass graft with fluid/inflammatory changes associated with the fem-fem bypass, most prominent on the left, as well as the left common femoral artery. 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 5.8 cm infrarenal abdominal aortic aneurysm, grossly unchanged. Recommend referral to a vascular specialist. Reference: J Am Coll Radiol 2013;10:789-794. Left lower lobe/perihilar tumor, corresponding to the patient's known small cell lung cancer, incompletely visualized but possibly mildly improved in the medial left lower lobe. Additional ancillary findings as above. Electronically Signed   By: Charline Bills M.D.   On: 10/22/2022 18:27        Scheduled Meds:  aspirin EC  81 mg Oral Daily   Chlorhexidine Gluconate Cloth  6 each Topical Q0600   heparin  5,000 Units Subcutaneous Q8H   multivitamin with minerals  1 tablet Oral Daily   mupirocin ointment  1 Application Nasal BID   pantoprazole  40 mg Oral Daily   rosuvastatin  20 mg Oral Daily   saccharomyces boulardii  250 mg  Oral BID   sodium chloride flush  3 mL Intravenous Q12H   Continuous Infusions:  sodium chloride     ceFEPime (MAXIPIME) IV 2 g (10/24/22 1051)   vancomycin 1,250 mg (10/24/22 1134)     LOS: 2 days    Time spent: 40 minutes    Ramiro Harvest, MD [facility]   To contact the attending provider between 7A-7P or the covering provider during after hours 7P-7A, please log into the web site [email or web address] and access using universal [place] password for that web site. If you do not have the password, please call the hospital operator.  10/24/2022, 4:39  PM

## 2022-10-25 ENCOUNTER — Inpatient Hospital Stay (HOSPITAL_COMMUNITY): Payer: Medicare Other | Admitting: Anesthesiology

## 2022-10-25 ENCOUNTER — Encounter (HOSPITAL_COMMUNITY): Payer: Self-pay | Admitting: Vascular Surgery

## 2022-10-25 ENCOUNTER — Other Ambulatory Visit: Payer: Self-pay

## 2022-10-25 ENCOUNTER — Encounter (HOSPITAL_COMMUNITY)
Admission: AD | Disposition: A | Discharge status: Skilled Nursing Facility | Payer: Self-pay | Source: Ambulatory Visit | Attending: Internal Medicine

## 2022-10-25 DIAGNOSIS — I714 Abdominal aortic aneurysm, without rupture, unspecified: Secondary | ICD-10-CM | POA: Diagnosis not present

## 2022-10-25 DIAGNOSIS — J449 Chronic obstructive pulmonary disease, unspecified: Secondary | ICD-10-CM

## 2022-10-25 DIAGNOSIS — T827XXD Infection and inflammatory reaction due to other cardiac and vascular devices, implants and grafts, subsequent encounter: Secondary | ICD-10-CM | POA: Diagnosis not present

## 2022-10-25 DIAGNOSIS — T827XXA Infection and inflammatory reaction due to other cardiac and vascular devices, implants and grafts, initial encounter: Secondary | ICD-10-CM

## 2022-10-25 DIAGNOSIS — Z87891 Personal history of nicotine dependence: Secondary | ICD-10-CM

## 2022-10-25 DIAGNOSIS — I739 Peripheral vascular disease, unspecified: Secondary | ICD-10-CM

## 2022-10-25 DIAGNOSIS — A419 Sepsis, unspecified organism: Secondary | ICD-10-CM | POA: Diagnosis not present

## 2022-10-25 HISTORY — PX: FEMORAL-FEMORAL BYPASS GRAFT: SHX936

## 2022-10-25 LAB — POCT I-STAT 7, (LYTES, BLD GAS, ICA,H+H)
Acid-base deficit: 4 mmol/L — ABNORMAL HIGH (ref 0.0–2.0)
Acid-base deficit: 3 mmol/L — ABNORMAL HIGH (ref 0.0–2.0)
Acid-base deficit: 3 mmol/L — ABNORMAL HIGH (ref 0.0–2.0)
Bicarbonate: 22.5 mmol/L (ref 20.0–28.0)
Bicarbonate: 22.3 mmol/L (ref 20.0–28.0)
Bicarbonate: 22.8 mmol/L (ref 20.0–28.0)
Calcium, Ion: 1.19 mmol/L (ref 1.15–1.40)
Calcium, Ion: 1.25 mmol/L (ref 1.15–1.40)
Calcium, Ion: 1.37 mmol/L (ref 1.15–1.40)
HCT: 25 % — ABNORMAL LOW (ref 39.0–52.0)
HCT: 24 % — ABNORMAL LOW (ref 39.0–52.0)
HCT: 28 % — ABNORMAL LOW (ref 39.0–52.0)
Hemoglobin: 8.5 g/dL — ABNORMAL LOW (ref 13.0–17.0)
Hemoglobin: 8.2 g/dL — ABNORMAL LOW (ref 13.0–17.0)
Hemoglobin: 9.5 g/dL — ABNORMAL LOW (ref 13.0–17.0)
O2 Saturation: 99 %
O2 Saturation: 100 %
O2 Saturation: 100 %
Patient temperature: 36.7
Patient temperature: 37
Patient temperature: 37.3
Potassium: 4.3 mmol/L (ref 3.5–5.1)
Potassium: 4.7 mmol/L (ref 3.5–5.1)
Potassium: 4.6 mmol/L (ref 3.5–5.1)
Sodium: 128 mmol/L — ABNORMAL LOW (ref 135–145)
Sodium: 128 mmol/L — ABNORMAL LOW (ref 135–145)
Sodium: 128 mmol/L — ABNORMAL LOW (ref 135–145)
TCO2: 24 mmol/L (ref 22–32)
TCO2: 23 mmol/L (ref 22–32)
TCO2: 24 mmol/L (ref 22–32)
pCO2 arterial: 45.4 mmHg (ref 32–48)
pCO2 arterial: 39.3 mmHg (ref 32–48)
pCO2 arterial: 41.8 mmHg (ref 32–48)
pH, Arterial: 7.301 — ABNORMAL LOW (ref 7.35–7.45)
pH, Arterial: 7.361 (ref 7.35–7.45)
pH, Arterial: 7.346 — ABNORMAL LOW (ref 7.35–7.45)
pO2, Arterial: 159 mmHg — ABNORMAL HIGH (ref 83–108)
pO2, Arterial: 221 mmHg — ABNORMAL HIGH (ref 83–108)
pO2, Arterial: 220 mmHg — ABNORMAL HIGH (ref 83–108)

## 2022-10-25 LAB — COMPREHENSIVE METABOLIC PANEL
ALT: 133 U/L — ABNORMAL HIGH (ref 0–44)
AST: 110 U/L — ABNORMAL HIGH (ref 15–41)
Albumin: 2.2 g/dL — ABNORMAL LOW (ref 3.5–5.0)
Alkaline Phosphatase: 82 U/L (ref 38–126)
Anion gap: 6 (ref 5–15)
BUN: 19 mg/dL (ref 8–23)
CO2: 25 mmol/L (ref 22–32)
Calcium: 8.1 mg/dL — ABNORMAL LOW (ref 8.9–10.3)
Chloride: 96 mmol/L — ABNORMAL LOW (ref 98–111)
Creatinine, Ser: 0.86 mg/dL (ref 0.61–1.24)
GFR, Estimated: >60 mL/min (ref >60)
Glucose, Bld: 118 mg/dL — ABNORMAL HIGH (ref 70–99)
Potassium: 4.3 mmol/L (ref 3.5–5.1)
Sodium: 127 mmol/L — ABNORMAL LOW (ref 135–145)
Total Bilirubin: 0.9 mg/dL (ref 0.3–1.2)
Total Protein: 5.4 g/dL — ABNORMAL LOW (ref 6.5–8.1)

## 2022-10-25 LAB — CBC WITH DIFFERENTIAL/PLATELET
Abs Immature Granulocytes: 0.17 10*3/uL — ABNORMAL HIGH (ref 0.00–0.07)
Basophils Absolute: 0 10*3/uL (ref 0.0–0.1)
Basophils Relative: 0 %
Eosinophils Absolute: 0 10*3/uL (ref 0.0–0.5)
Eosinophils Relative: 0 %
HCT: 31.5 % — ABNORMAL LOW (ref 39.0–52.0)
Hemoglobin: 11.1 g/dL — ABNORMAL LOW (ref 13.0–17.0)
Immature Granulocytes: 2 %
Lymphocytes Relative: 7 %
Lymphs Abs: 0.7 10*3/uL (ref 0.7–4.0)
MCH: 29.1 pg (ref 26.0–34.0)
MCHC: 35.2 g/dL (ref 30.0–36.0)
MCV: 82.7 fL (ref 80.0–100.0)
Monocytes Absolute: 0.9 10*3/uL (ref 0.1–1.0)
Monocytes Relative: 8 %
Neutro Abs: 8.7 10*3/uL — ABNORMAL HIGH (ref 1.7–7.7)
Neutrophils Relative %: 83 %
Platelets: 254 10*3/uL (ref 150–400)
RBC: 3.81 MIL/uL — ABNORMAL LOW (ref 4.22–5.81)
RDW: 13.8 % (ref 11.5–15.5)
WBC: 10.6 10*3/uL — ABNORMAL HIGH (ref 4.0–10.5)
nRBC: 0 % (ref 0.0–0.2)

## 2022-10-25 LAB — CREATININE, SERUM
Creatinine, Ser: 0.92 mg/dL (ref 0.61–1.24)
GFR, Estimated: >60 mL/min (ref >60)

## 2022-10-25 LAB — HEPATITIS PANEL, ACUTE
HCV Ab: NONREACTIVE
Hep A IgM: NONREACTIVE
Hep B C IgM: NONREACTIVE
Hepatitis B Surface Ag: NONREACTIVE

## 2022-10-25 LAB — POCT ACTIVATED CLOTTING TIME
Activated Clotting Time: 214 seconds
Activated Clotting Time: 207 seconds
Activated Clotting Time: 214 seconds

## 2022-10-25 LAB — CBC
HCT: 28.1 % — ABNORMAL LOW (ref 39.0–52.0)
Hemoglobin: 9.7 g/dL — ABNORMAL LOW (ref 13.0–17.0)
MCH: 29 pg (ref 26.0–34.0)
MCHC: 34.5 g/dL (ref 30.0–36.0)
MCV: 84.1 fL (ref 80.0–100.0)
Platelets: 215 10*3/uL (ref 150–400)
RBC: 3.34 MIL/uL — ABNORMAL LOW (ref 4.22–5.81)
RDW: 16 % — ABNORMAL HIGH (ref 11.5–15.5)
WBC: 13.4 10*3/uL — ABNORMAL HIGH (ref 4.0–10.5)
nRBC: 0 % (ref 0.0–0.2)

## 2022-10-25 LAB — MAGNESIUM: Magnesium: 1.8 mg/dL (ref 1.7–2.4)

## 2022-10-25 LAB — PREPARE RBC (CROSSMATCH)

## 2022-10-25 SURGERY — CREATION, BYPASS, ARTERIAL, FEMORAL TO FEMORAL, USING GRAFT
Anesthesia: General | Site: Groin | Laterality: Bilateral

## 2022-10-25 MED ORDER — HYDROMORPHONE HCL 1 MG/ML IJ SOLN: 0.5000 mg | INTRAMUSCULAR | Status: DC | PRN

## 2022-10-25 MED ORDER — HEPARIN SODIUM (PORCINE) 1000 UNIT/ML IJ SOLN
INTRAMUSCULAR | Status: DC | PRN
  Administered 2022-10-25: 3000 [IU] via INTRAVENOUS
  Administered 2022-10-25 (×3): 5000 [IU] via INTRAVENOUS

## 2022-10-25 MED ORDER — PROPOFOL 10 MG/ML IV BOLUS
INTRAVENOUS | Status: DC | PRN
Start: 2022-10-25 — End: 2022-10-25
  Administered 2022-10-25: 100 mg via INTRAVENOUS

## 2022-10-25 MED ORDER — CHLORHEXIDINE GLUCONATE 0.12 % MT SOLN: 15.0000 mL | OROMUCOSAL | Status: AC

## 2022-10-25 MED ORDER — ROCURONIUM BROMIDE 10 MG/ML (PF) SYRINGE
PREFILLED_SYRINGE | INTRAVENOUS | Status: DC | PRN
  Administered 2022-10-25: 70 mg via INTRAVENOUS
  Administered 2022-10-25: 30 mg via INTRAVENOUS
  Administered 2022-10-25: 10 mg via INTRAVENOUS
  Administered 2022-10-25 (×3): 20 mg via INTRAVENOUS

## 2022-10-25 MED ORDER — HEMOSTATIC AGENTS (NO CHARGE) OPTIME
TOPICAL | Status: DC | PRN
Start: 2022-10-25 — End: 2022-10-25
  Administered 2022-10-25: 2 via TOPICAL

## 2022-10-25 MED ORDER — DOCUSATE SODIUM 100 MG PO CAPS
100.0000 mg | ORAL_CAPSULE | Freq: Every day | ORAL | Status: DC
  Administered 2022-10-26 – 2022-11-12 (×14): 100 mg via ORAL
  Filled 2022-10-25 (×17): qty 1

## 2022-10-25 MED ORDER — HEPARIN 6000 UNIT IRRIGATION SOLUTION
Status: AC
  Filled 2022-10-25: qty 500

## 2022-10-25 MED ORDER — FENTANYL CITRATE (PF) 250 MCG/5ML IJ SOLN
INTRAMUSCULAR | Status: AC
  Filled 2022-10-25: qty 5

## 2022-10-25 MED ORDER — EPHEDRINE SULFATE-NACL 50-0.9 MG/10ML-% IV SOSY
PREFILLED_SYRINGE | INTRAVENOUS | Status: DC | PRN
  Administered 2022-10-25: 5 mg via INTRAVENOUS

## 2022-10-25 MED ORDER — PROPOFOL 10 MG/ML IV BOLUS
INTRAVENOUS | Status: AC
  Filled 2022-10-25: qty 20

## 2022-10-25 MED ORDER — LACTATED RINGERS IV SOLN: INTRAVENOUS | Status: DC

## 2022-10-25 MED ORDER — PHENYLEPHRINE 80 MCG/ML (10ML) SYRINGE FOR IV PUSH (FOR BLOOD PRESSURE SUPPORT)
PREFILLED_SYRINGE | INTRAVENOUS | Status: DC | PRN
  Administered 2022-10-25 (×3): 80 ug via INTRAVENOUS

## 2022-10-25 MED ORDER — LABETALOL HCL 5 MG/ML IV SOLN
INTRAVENOUS | Status: DC | PRN
  Administered 2022-10-25: 5 mg via INTRAVENOUS

## 2022-10-25 MED ORDER — LACTATED RINGERS IV SOLN
INTRAVENOUS | Status: DC | PRN
Start: 2022-10-25 — End: 2022-10-25

## 2022-10-25 MED ORDER — CHLORHEXIDINE GLUCONATE 0.12 % MT SOLN
OROMUCOSAL | Status: AC
  Filled 2022-10-25: qty 15

## 2022-10-25 MED ORDER — 0.9 % SODIUM CHLORIDE (POUR BTL) OPTIME
TOPICAL | Status: DC | PRN
  Administered 2022-10-25: 1000 mL

## 2022-10-25 MED ORDER — LIDOCAINE 2% (20 MG/ML) 5 ML SYRINGE
INTRAMUSCULAR | Status: DC | PRN
  Administered 2022-10-25: 60 mg via INTRAVENOUS

## 2022-10-25 MED ORDER — SODIUM CHLORIDE 0.9% IV SOLUTION
Freq: Once | INTRAVENOUS | Status: AC
  Administered 2022-10-25: 500 mL via INTRAVENOUS
  Administered 2022-10-25: 100 mL/h via INTRAVENOUS

## 2022-10-25 MED ORDER — PROTAMINE SULFATE 10 MG/ML IV SOLN
INTRAVENOUS | Status: DC | PRN
  Administered 2022-10-25 (×5): 10 mg via INTRAVENOUS

## 2022-10-25 MED ORDER — FENTANYL CITRATE (PF) 250 MCG/5ML IJ SOLN
INTRAMUSCULAR | Status: DC | PRN
  Administered 2022-10-25 (×5): 50 ug via INTRAVENOUS

## 2022-10-25 MED ORDER — SODIUM CHLORIDE 0.9 % IV SOLN: INTRAVENOUS | Status: DC | PRN

## 2022-10-25 MED ORDER — SODIUM CHLORIDE 0.9 % IV SOLN: INTRAVENOUS | Status: DC

## 2022-10-25 MED ORDER — SENNOSIDES-DOCUSATE SODIUM 8.6-50 MG PO TABS: 1.0000 | ORAL_TABLET | Freq: Every evening | ORAL | Status: DC | PRN

## 2022-10-25 MED ORDER — HEPARIN 6000 UNIT IRRIGATION SOLUTION
Status: DC | PRN
  Administered 2022-10-25: 1

## 2022-10-25 MED ORDER — OXYCODONE HCL 5 MG PO TABS
5.0000 mg | ORAL_TABLET | ORAL | Status: DC | PRN
  Filled 2022-10-25 (×2): qty 2

## 2022-10-25 MED ORDER — SODIUM CHLORIDE 0.9 % IV SOLN: 500.0000 mL | Freq: Once | INTRAVENOUS | Status: DC | PRN

## 2022-10-25 MED ORDER — ALBUMIN HUMAN 5 % IV SOLN: INTRAVENOUS | Status: DC | PRN

## 2022-10-25 MED ORDER — SUGAMMADEX SODIUM 200 MG/2ML IV SOLN
INTRAVENOUS | Status: DC | PRN
  Administered 2022-10-25: 200 mg via INTRAVENOUS

## 2022-10-25 MED ORDER — MAGNESIUM SULFATE 2 GM/50ML IV SOLN
2.0000 g | Freq: Every day | INTRAVENOUS | Status: AC | PRN
  Administered 2022-10-26: 2 g via INTRAVENOUS

## 2022-10-25 MED ORDER — BISACODYL 5 MG PO TBEC: 5.0000 mg | DELAYED_RELEASE_TABLET | Freq: Every day | ORAL | Status: DC | PRN

## 2022-10-25 MED ORDER — LACTATED RINGERS IV SOLN: INTRAVENOUS | Status: DC | PRN

## 2022-10-25 MED ORDER — PHENYLEPHRINE HCL-NACL 20-0.9 MG/250ML-% IV SOLN
INTRAVENOUS | Status: DC | PRN
  Administered 2022-10-25: 50 ug/min via INTRAVENOUS

## 2022-10-25 SURGICAL SUPPLY — 68 items
ADH SKN CLS APL DERMABOND .7 (GAUZE/BANDAGES/DRESSINGS) ×2
BAG COUNTER SPONGE SURGICOUNT (BAG) ×1 IMPLANT
BAG SPNG CNTER NS LX DISP (BAG) ×1
BALLN MUSTANG 6.0X40 75 (BALLOONS) ×1
BALLOON MUSTANG 6.0X40 75 (BALLOONS) IMPLANT
BLADE CLIPPER SURG (BLADE) ×1 IMPLANT
CANISTER SUCT 3000ML PPV (MISCELLANEOUS) ×1 IMPLANT
CATH EMB 3FR 40 (CATHETERS) IMPLANT
CATH EMB 4FR 40 (CATHETERS) IMPLANT
CATH EMB 5FR 80CM (CATHETERS) IMPLANT
CLIP TI MEDIUM 24 (CLIP) ×1 IMPLANT
CLIP TI WIDE RED SMALL 24 (CLIP) ×1 IMPLANT
CRYO VEIN SAPHENOUS (Tissue) ×1 IMPLANT
DERMABOND ADVANCED .7 DNX12 (GAUZE/BANDAGES/DRESSINGS) IMPLANT
DRAIN CHANNEL 15F RND FF W/TCR (WOUND CARE) IMPLANT
DRAIN RELI 100 BL SUC LF ST (DRAIN)
DRAPE INCISE IOBAN 66X45 STRL (DRAPES) IMPLANT
DRESSING MORCELLS FINE 1000 (Tissue) IMPLANT
DRESSING PEEL AND PLC PRVNA 13 (GAUZE/BANDAGES/DRESSINGS) IMPLANT
DRSG PEEL AND PLACE PREVENA 13 (GAUZE/BANDAGES/DRESSINGS) ×2
ELECT REM PT RETURN 9FT ADLT (ELECTROSURGICAL) ×1
ELECTRODE REM PT RTRN 9FT ADLT (ELECTROSURGICAL) ×1 IMPLANT
EVACUATOR SILICONE 100CC (DRAIN) IMPLANT
GAUZE 4X4 16PLY ~~LOC~~+RFID DBL (SPONGE) IMPLANT
GLOVE BIO SURGEON STRL SZ7.5 (GLOVE) IMPLANT
GLOVE BIOGEL PI IND STRL 8 (GLOVE) ×1 IMPLANT
GLOVE SS BIOGEL STRL SZ 7 (GLOVE) IMPLANT
GOWN STRL REUS W/ TWL LRG LVL3 (GOWN DISPOSABLE) ×2 IMPLANT
GOWN STRL REUS W/TWL 2XL LVL3 (GOWN DISPOSABLE) ×2 IMPLANT
GOWN STRL REUS W/TWL LRG LVL3 (GOWN DISPOSABLE) ×2
GRAFT VASC ANGIOGRAFT 71-80 (Tissue) IMPLANT
HEMOSTAT SNOW SURGICEL 2X4 (HEMOSTASIS) IMPLANT
INSERT FOGARTY SM (MISCELLANEOUS) ×1 IMPLANT
KIT BASIN OR (CUSTOM PROCEDURE TRAY) ×1 IMPLANT
KIT ENCORE 26 ADVANTAGE (KITS) IMPLANT
KIT TURNOVER KIT B (KITS) ×1 IMPLANT
LOOP VASCULAR MINI 18 RED (MISCELLANEOUS) ×1
NS IRRIG 1000ML POUR BTL (IV SOLUTION) ×2 IMPLANT
PACK PERIPHERAL VASCULAR (CUSTOM PROCEDURE TRAY) ×1 IMPLANT
PAD ARMBOARD 7.5X6 YLW CONV (MISCELLANEOUS) ×2 IMPLANT
PENCIL BUTTON HOLSTER BLD 10FT (ELECTRODE) IMPLANT
POWDER MYRIAD MORCELLS 1000MG (Miscellaneous) IMPLANT
SET WALTER ACTIVATION W/DRAPE (SET/KITS/TRAYS/PACK) IMPLANT
SPONGE T-LAP 18X18 ~~LOC~~+RFID (SPONGE) IMPLANT
STAPLER VISISTAT 35W (STAPLE) IMPLANT
STOPCOCK 4 WAY LG BORE MALE ST (IV SETS) IMPLANT
SUT MNCRL AB 4-0 PS2 18 (SUTURE) ×2 IMPLANT
SUT PROLENE 4 0 RB 1 (SUTURE) ×1
SUT PROLENE 4-0 RB1 .5 CRCL 36 (SUTURE) IMPLANT
SUT PROLENE 5 0 C 1 24 (SUTURE) ×2 IMPLANT
SUT PROLENE 6 0 BV (SUTURE) IMPLANT
SUT SILK 2 0 PERMA HAND 18 BK (SUTURE) IMPLANT
SUT VIC AB 2-0 CT1 27 (SUTURE) ×3
SUT VIC AB 2-0 CT1 TAPERPNT 27 (SUTURE) ×2 IMPLANT
SUT VIC AB 3-0 SH 27 (SUTURE) ×2
SUT VIC AB 3-0 SH 27X BRD (SUTURE) ×2 IMPLANT
SWAB CULTURE ESWAB REG 1ML (MISCELLANEOUS) IMPLANT
SWAB CULTURE LIQ STUART DBL (MISCELLANEOUS) IMPLANT
SYR 30ML LL (SYRINGE) IMPLANT
SYR 3ML LL SCALE MARK (SYRINGE) IMPLANT
SYR BULB IRRIG 60ML STRL (SYRINGE) IMPLANT
TAPE UMBILICAL 1/8X30 (MISCELLANEOUS) IMPLANT
TOWEL GREEN STERILE (TOWEL DISPOSABLE) ×1 IMPLANT
TRAY FOLEY MTR SLVR 16FR STAT (SET/KITS/TRAYS/PACK) ×1 IMPLANT
UNDERPAD 30X36 HEAVY ABSORB (UNDERPADS AND DIAPERS) ×1 IMPLANT
VASCULAR TIE MINI RED 18IN STL (MISCELLANEOUS) IMPLANT
VEIN 'CRYO SAPHENOUS (Tissue) ×1 IMPLANT
WATER STERILE IRR 1000ML POUR (IV SOLUTION) ×1 IMPLANT

## 2022-10-25 NOTE — Progress Notes (Signed)
Groins soft, VACs to suction Dp signals  Best rest tonight

## 2022-10-25 NOTE — Plan of Care (Signed)

## 2022-10-25 NOTE — Transfer of Care (Signed)
Immediate Anesthesia Transfer of Care Note  Patient: Skylar Knechtel  Procedure(s) Performed: EXCISION BYPASS GRAFT FEMORAL-FEMORAL ARTERY AND REDO FEMORAL-FEMORAL BYPASS GRAFT USING CRYO-SAPHENOUS VEIN [id number] (Bilateral: Groin)  Patient Location: PACU  Anesthesia Type:General  Level of Consciousness: awake, alert , and sedated  Airway & Oxygen Therapy: Patient connected to face mask oxygen  Post-op Assessment: Post -op Vital signs reviewed and stable  Post vital signs: stable  Last Vitals:  Vitals Value Taken Time  BP 87/59 10/25/22 1323  Temp    Pulse 75 10/25/22 1327  Resp 18 10/25/22 1327  SpO2 99 % 10/25/22 1327  Vitals shown include unfiled device data.  Last Pain:  Vitals:   10/25/22 0651  TempSrc: Oral  PainSc:          Complications: No notable events documented.

## 2022-10-25 NOTE — Anesthesia Procedure Notes (Signed)
Arterial Line Insertion Start/End8/15/2024 7:00 AM, 10/25/2022 7:10 AM Performed by: Dorie Rank, CRNA, CRNA  Patient location: Pre-op. Preanesthetic checklist: patient identified, IV checked, site marked, risks and benefits discussed, surgical consent, monitors and equipment checked, pre-op evaluation and timeout performed Lidocaine 1% used for infiltration Right, radial was placed Catheter size: 20 G Hand hygiene performed , maximum sterile barriers used  and Seldinger technique used Allen's test indicative of satisfactory collateral circulation Attempts: 1 Procedure performed without using ultrasound guided technique. Following insertion, Biopatch and dressing applied. Post procedure assessment: normal  Patient tolerated the procedure well with no immediate complications.

## 2022-10-25 NOTE — Progress Notes (Signed)
Patient transferred to OR holding 33 via bed with OR staff. Pt AO x4. CCMD called and notified. Report called to [facility] CRNA.

## 2022-10-25 NOTE — Progress Notes (Signed)
PROGRESS NOTE    Edward Campos  OHY:[id number] DOB: 1932-06-20 DOA: 10/22/2022 PCP: Patient, No Pcp Per   No chief complaint on file.   Brief Narrative:  Edward Campos is a 89 y.o. male with past medical history of HLD, PVD s/p bypass grafting, AAA, SCLC who presented as a direct admission to the vascular service for concern a infected femoral bypass.  Patient had been hospitalized in June for infected right to left femoral to femoral bypass graft with temporary wound VAC placement.  Over the last week he had noticed  increased swelling, pain, bloody/possible pus drainage, reported fevers up to 101 F at home.  Records note he was advised to go to the emergency department for further evaluation, but ended up followed up in the office on 8/12.  Orders were placed to admit the patient directly to the hospital for need of removal of the graft.  Patient was noted to be afebrile with pulse 84-109, and all other vital signs relatively maintained.  Labs from yesterday significant for WBC 13.2, hemoglobin 11.5, sodium 122, calcium, and ALT 46.  MRSA PCR screen was positive.  CT angiogram of the abdomen pelvis noted right bifemoral bypass graft with fluid/inflammatory changes associated with the femorofemoral bypass most prominent on the left compatible with a graft infection, and a 5.8 infrarenal abdominal aortic aneurysm grossly unchanged in size.  Patient had been started on empiric antibiotics of vancomycin and cefepime.  He had been posted for graft excision today.  However, patient declined having graft removed this morning as he wanted time to think about his options.  He is amendable to having the graft removed at this time.  Vascular surgery asked [facility] to assume care as patient due to other comorbidities.   Patient also reports that he is continue to have a intermittently productive cough since admitted in the hospital back in July with a postobstructive pneumonia.  Denies coughing up any blood.  He has  not been able to start chemotherapy due to his wounds not being completely healed.   Assessment & Plan:   Principal Problem:   Infection of vascular bypass graft (HCC) Active Problems:   Sepsis (HCC)   Peripheral vascular disease (HCC)   Small cell lung cancer, left lower lobe (HCC)   Hypokalemia   Transaminitis   Hyponatremia   AAA (abdominal aortic aneurysm) (HCC)   PAD (peripheral artery disease) (HCC)  #1 sepsis secondary to infected left femoral graft, POA -Patient met criteria for sepsis on admission with tachycardia, leukocytosis, fevers of 101 Fahrenheit prior to admission. -MRSA screening noted to be positive. -CT angiogram abdomen and pelvis noted right bifemoral bypass graft with fluid/inflammatory changes associated with femoral-femoral bypass most prominent on the left compatible with graft infection. -Blood cultures and lactic acid levels obtained and pending. -Patient noted to have received IV Vanco and cefepime prior to blood cultures being drawn. -Patient seen by vascular surgery who initially had planned on removing graft, patient noted to have initially declined needing more time to think about it and patient subsequently agreed for graft removal.   -Patient underwent excision of infected right to left femorofemoral bypass graft with redo right to left femorofemoral bypass graft with cadaveric great saphenous vein, left common femoral to left SFA interposition bypass with cadaveric great saphenous vein, redo exposure of bilateral common femoral arteries greater than 30 days, my read more cells bilateral groin wounds, bilateral Prevena wound VAC placement.(10/25/2022) -Significant purulence noted in the left groin and the common femoral  artery in the left groin significantly scarred in place and friable per op note findings.  Cultures sent with preliminary results with abundant gram-positive cocci in pairs and clusters -Continue empiric IV vancomycin, IV  cefepime. -Probiotics. -Will consult with ID for antibiotic recommendations and duration. -Per vascular surgery.  2.  Acute on chronic hyponatremia -Baseline sodium 128-1 31. -On initial presentation sodium noted at 122 with repeat at 126. -Patient with history of small cell lung cancer. -Sodium currently at 127 this morning.  3.  Hypokalemia -Potassium of 4.3 this morning.  Magnesium at 1.8.  -Repeat labs in the AM.   4.  Small cell lung cancer with bone metastases -Patient noted stage IV lung cancer with disease to the bone and prior postobstructive pneumonia. -CT scan done with branching masslike opacity measuring 5.2 x 7.9 cm in the left hilar region/central left lower lobe similar in size to prior. -Chemotherapy noted not to have been initiated due to wounds not being healed yet. -Palliative care consulted for goals of care. -Outpatient follow-up with primary oncologist.  5.  PAD -Continue aspirin. -Statin discontinued due to transaminitis.   -Plavix held, vascular surgery to advise when Plavix may be resumed.  6.  Transaminitis -??  Etiology. -CT angiogram abdomen and pelvis with no acute abnormalities noted in the left. -LFTs trending up, -Acute hepatitis panel negative.   -Crestor discontinued.   -Follow.  7.  Hyperlipidemia -Crestor held due to rising LFTs.    8.  AAA -Patient with 5.8 x 5.7 cm infrarenal abdominal aortic aneurysm unchanged in size. -Vascular surgery following.     DVT prophylaxis: Heparin Code Status: Full Family Communication: Updated patient, daughter at bedside. Disposition: TBD  Status is: Inpatient Remains inpatient appropriate because: Severity of illness   Consultants:  Palliative care: Lorinda Creed, NP 10/24/2022 [facility] hospitalist: Dr. Katrinka Blazing 10/23/2022 Vascular surgery  Procedures:  CT angiogram abdomen and pelvis 10/22/2022 1.  Redo exposure of bilateral common femoral arteries greater than 30 days 2.  Excision of infected  right to left femoral-femoral bypass graft 3.  Redo right to left femoral-femoral bypass graft with cadaveric great saphenous vein 4.  Left common femoral to left SFA interposition bypass with cadaveric great saphenous vein 5.  Myriad morcells bilateral groin wounds 6.  Bilateral Prevena wound VAC placement ----Per vascular surgery: Dr. Chestine Spore and Dr. Sherral Hammers 10/25/2022.  Antimicrobials: Anti-infectives (From admission, onward)    Start     Dose/Rate Route Frequency Ordered Stop   10/25/22 1006  ceFAZolin 1 g / gentamicin 80 mg in NS 500 mL surgical irrigation  Status:  Discontinued          As needed 10/25/22 1006 10/25/22 1318   10/23/22 1145  vancomycin (VANCOREADY) IVPB 1250 mg/250 mL        1,250 mg 166.7 mL/hr over 90 Minutes Intravenous Every 24 hours 10/23/22 1051     10/23/22 1100  ceFEPIme (MAXIPIME) 2 g in sodium chloride 0.9 % 100 mL IVPB        2 g 200 mL/hr over 30 Minutes Intravenous Every 12 hours 10/23/22 1041           Subjective: Patient lying in bed, just returned from the OR.  Alert.  States he feels restricted with all of these lines.  Denies any chest pain or shortness of breath.  Some lower abdominal pain/discomfort.  Daughter at bedside.    Objective: Vitals:   10/25/22 1345 10/25/22 1400 10/25/22 1428 10/25/22 1518  BP: (!) 120/93 122/65  123/65 110/71  Pulse: 75 73 73 77  Resp: 15 12 11 13   Temp:  98.5 F (36.9 C)    TempSrc:      SpO2: 98% 100% 93% 99%  Weight:      Height:        Intake/Output Summary (Last 24 hours) at 10/25/2022 1621 Last data filed at 10/25/2022 1250 Gross per 24 hour  Intake 2880 ml  Output 1600 ml  Net 1280 ml   Filed Weights   10/23/22 0431 10/25/22 0645 10/25/22 0651  Weight: 75.9 kg 75.9 kg 75.9 kg    Examination:  General exam: NAD Respiratory system: CTAB anterior lung fields.  No wheezes, no crackles, no rhonchi.  Fair air movement.  Speaking in full sentences.  Respiratory effort normal.   Cardiovascular  system: Regular rate rhythm no murmurs rubs or gallops.  No JVD.  No lower extremity edema.   Gastrointestinal system: Abdomen is soft, nontender, nondistended, positive bowel sounds.  Bilateral wound vacs noted in lower abdominal region.  Central nervous system: Alert and oriented. No focal neurological deficits. Extremities: Symmetric 5 x 5 power. Skin: No rashes, lesions or ulcers Psychiatry: Judgement and insight appear normal. Mood & affect appropriate.     Data Reviewed: I have personally reviewed following labs and imaging studies  CBC: Recent Labs  Lab 10/22/22 1527 10/23/22 1224 10/24/22 0940 10/25/22 0524 10/25/22 1001 10/25/22 1056 10/25/22 1145 10/25/22 1454  WBC 13.2* 12.0* 14.5* 10.6*  --   --   --  13.4*  NEUTROABS  --   --  12.0* 8.7*  --   --   --   --   HGB 11.5* 11.6* 12.6* 11.1* 8.5* 8.2* 9.5* 9.7*  HCT 32.4* 32.2* 35.3* 31.5* 25.0* 24.0* 28.0* 28.1*  MCV 82.9 84.3 83.1 82.7  --   --   --  84.1  PLT 210 213 278 254  --   --   --  215    Basic Metabolic Panel: Recent Labs  Lab 10/22/22 1527 10/23/22 1224 10/24/22 0940 10/25/22 0524 10/25/22 1001 10/25/22 1056 10/25/22 1145 10/25/22 1454  NA 122* 126* 126* 127* 128* 128* 128*  --   K 4.1 3.4* 3.2* 4.3 4.3 4.7 4.6  --   CL 89* 90* 92* 96*  --   --   --   --   CO2 22 23 23 25   --   --   --   --   GLUCOSE 120* 157* 129* 118*  --   --   --   --   BUN 21 21 20 19   --   --   --   --   CREATININE 1.01 0.93 0.99 0.86  --   --   --  0.92  CALCIUM 8.3* 8.2* 8.4* 8.1*  --   --   --   --   MG  --   --  1.9 1.8  --   --   --   --     GFR: Estimated Creatinine Clearance: 56.6 mL/min (by C-G formula based on SCr of 0.92 mg/dL).  Liver Function Tests: Recent Labs  Lab 10/22/22 1527 10/23/22 1224 10/24/22 0940 10/25/22 0524  AST 41 57* 85* 110*  ALT 46* 55* 89* 133*  ALKPHOS 77 78 81 82  BILITOT 1.1 1.3* 0.9 0.9  PROT 6.0* 5.8* 6.0* 5.4*  ALBUMIN 2.9* 2.6* 2.6* 2.2*    CBG: No results for  input(s): "GLUCAP" in the last 168 hours.  Recent Results (from the past 240 hour(s))  Surgical pcr screen     Status: Abnormal   Collection Time: 10/23/22  7:44 AM   Specimen: Nasal Mucosa; Nasal Swab  Result Value Ref Range Status   MRSA, PCR POSITIVE (A) NEGATIVE Final    Comment: RESULT CALLED TO, READ BACK BY AND VERIFIED WITH: RN MIA RINAL [id number] AT 1029 BY EC    Staphylococcus aureus POSITIVE (A) NEGATIVE Final    Comment: (NOTE) The Xpert SA Assay (FDA approved for NASAL specimens in patients 76 years of age and older), is one component of a comprehensive surveillance program. It is not intended to diagnose infection nor to guide or monitor treatment. Performed at [facility], [address], [place], [place] [address]   Culture, blood (Routine X 2) w Reflex to ID Panel     Status: None (Preliminary result)   Collection Time: 10/23/22 12:24 PM   Specimen: BLOOD  Result Value Ref Range Status   Specimen Description BLOOD RIGHT ANTECUBITAL  Final   Special Requests Blood Culture adequate volume  Final   Culture   Final    NO GROWTH 2 DAYS Performed at [facility], [address], [place], [place] [address]    Report Status PENDING  Incomplete  Culture, blood (Routine X 2) w Reflex to ID Panel     Status: None (Preliminary result)   Collection Time: 10/23/22 12:24 PM   Specimen: BLOOD  Result Value Ref Range Status   Specimen Description BLOOD BLOOD RIGHT FOREARM  Final   Special Requests   Final    BOTTLES DRAWN AEROBIC AND ANAEROBIC Blood Culture adequate volume   Culture   Final    NO GROWTH 2 DAYS Performed at [facility], [address] [place], [place] [address]    Report Status PENDING  Incomplete  Aerobic/Anaerobic Culture w Gram Stain (surgical/deep wound)     Status: None (Preliminary result)   Collection Time: 10/25/22 12:35 PM   Specimen: Path fluid; Tissue  Result Value Ref Range Status   Specimen Description FLUID  Final    Special Requests infected bilateral femoral graft  Final   Gram Stain   Final    ABUNDANT WBC PRESENT, PREDOMINANTLY PMN ABUNDANT GRAM POSITIVE COCCI IN PAIRS IN CLUSTERS Performed at [facility], [address] [place], [place] [address]    Culture PENDING  Incomplete   Report Status PENDING  Incomplete         Radiology Studies: No results found.      Scheduled Meds:  aspirin EC  81 mg Oral Daily   chlorhexidine  15 mL Mouth/Throat NOW   chlorhexidine       Chlorhexidine Gluconate Cloth  6 each Topical Q0600   [START ON 10/26/2022] docusate sodium  100 mg Oral Daily   heparin  5,000 Units Subcutaneous Q8H   multivitamin with minerals  1 tablet Oral Daily   mupirocin ointment  1 Application Nasal BID   pantoprazole  40 mg Oral Daily   saccharomyces boulardii  250 mg Oral BID   sodium chloride flush  3 mL Intravenous Q12H   Continuous Infusions:  sodium chloride     sodium chloride     ceFEPime (MAXIPIME) IV 2 g (10/24/22 2329)   lactated ringers     magnesium sulfate bolus IVPB     vancomycin 1,250 mg (10/24/22 1134)     LOS: 3 days    Time spent:  40 minutes    Ramiro Harvest, MD [facility]   To contact the attending provider between 7A-7P or the covering provider during after hours 7P-7A, please log into the web site [email or web address] and access using universal  password for that web site. If you do not have the password, please call the hospital operator.  10/25/2022, 4:21 PM

## 2022-10-25 NOTE — Progress Notes (Signed)
[facility]  Subjective  - ready for surgery. No complaints    Objective (!) 169/84 69 98.2 F (36.8 C) (Oral) 18 97%  Intake/Output Summary (Last 24 hours) at 10/25/2022 0729 Last data filed at 10/25/2022 0200 Gross per 24 hour  Intake 930 ml  Output --  Net 930 ml   Central pelvic incisional/graft infection Heart RRR Lungs non labored breathing   Assessment/Planning: 89 y.o. male is here with worsening infection of his right to left femorofemoral bypass graft.   I sat down with Edward Campos yesterday evening and discussed his current clinical status.  Unfortunately, the femoral-femoral bypass is infected.  He has very few options left.  He has metastatic cancer, as well as an aneurysm that is sizable.  At the age of 68, we discussed palliative care versus surgery, Edward Campos elected to proceed with surgery.  I do not think he would tolerate vein patches due to dense ischemia.  I think that he needs an inflow operation to the left leg.  My plan is to use CryoVein and a new tunnel tract, muscle flaps discussing her symptoms and the above, Edward Campos elected to proceed with femorofemoral explant, redo femorofemoral with CryoVein, bilateral sartorius muscle flaps and any all surgery necessary.   Edward Campos 10/25/2022 7:29 AM --  Laboratory Lab Results: Recent Labs    10/24/22 0940 10/25/22 0524  WBC 14.5* 10.6*  HGB 12.6* 11.1*  HCT 35.3* 31.5*  PLT 278 254   BMET Recent Labs    10/24/22 0940 10/25/22 0524  NA 126* 127*  K 3.2* 4.3  CL 92* 96*  CO2 23 25  GLUCOSE 129* 118*  BUN 20 19  CREATININE 0.99 0.86  CALCIUM 8.4* 8.1*    COAG Lab Results  Component Value Date   INR 1.2 08/16/2022   INR 1.1 06/04/2019   No results found for: "PTT"  I have independently interviewed and examined patient and agree with PA assessment and plan above.  Appreciate hospital medicine care of patient and palliative care involvement.  Plan for graft  excision tomorrow with Dr. Karin Lieu in the OR.  Edward C. Randie Heinz, MD [facility] Office: [phone] Pager: [phone]

## 2022-10-25 NOTE — Anesthesia Preprocedure Evaluation (Addendum)
Anesthesia Evaluation  Patient identified by MRN, date of birth, ID band Patient awake    Reviewed: Allergy & Precautions, H&P , NPO status , Patient's Chart, lab work & pertinent test results  Airway Mallampati: III  TM Distance: >3 FB Neck ROM: Full    Dental  (+) Teeth Intact, Dental Advisory Given   Pulmonary COPD, former smoker Quit smoking 2021, 100 pack year history  Lung ca-  stage IV lung cancer with disease to the bone and prior postobstructive pneumonia. -CT scan done with branching masslike opacity measuring 5.2 x 7.9 cm in the left hilar region/central left lower lobe similar in size to prior. -Chemotherapy noted not to have been initiated due to wounds not being healed yet.    Pulmonary exam normal breath sounds clear to auscultation       Cardiovascular + Peripheral Vascular Disease (infected graft s/p fem-fem bypass 08/2022)  Normal cardiovascular exam+ Valvular Problems/Murmurs (mild MR) MR  Rhythm:Regular Rate:Normal  Echo 11/2020  1. Left ventricular ejection fraction, by estimation, is 65 to 70%. The  left ventricle has normal function. The left ventricle has no regional  wall motion abnormalities. Left ventricular diastolic parameters are  indeterminate. Elevated left atrial  pressure.   2. Right ventricular systolic function is normal. The right ventricular  size is normal. There is normal pulmonary artery systolic pressure.   3. Left atrial size was moderately dilated.   4. Right atrial size was mildly dilated.   5. The mitral valve is abnormal. Mild mitral valve regurgitation. No  evidence of mitral stenosis.   6. The aortic valve was not well visualized. There is mild calcification  of the aortic valve. There is mild thickening of the aortic valve. Aortic  valve regurgitation is not visualized. No aortic stenosis is present.   7. The inferior vena cava is dilated in size with >50% respiratory   variability, suggesting right atrial pressure of 8 mmHg.   5.8 infrarenal abdominal aortic aneurysm grossly unchanged in size     Neuro/Psych  PSYCHIATRIC DISORDERS Anxiety     negative neurological ROS     GI/Hepatic negative GI ROS, Neg liver ROS,,,  Endo/Other  negative endocrine ROS  Chronic hyponatremia, up to 127 this AM  Renal/GU negative Renal ROS  negative genitourinary   Musculoskeletal  (+) Arthritis , Osteoarthritis,    Abdominal   Peds negative pediatric ROS (+)  Hematology  (+) Blood dyscrasia, anemia Hb 11.1, plt 254   Anesthesia Other Findings   Reproductive/Obstetrics negative OB ROS                             Anesthesia Physical Anesthesia Plan  ASA: 4  Anesthesia Plan: General   Post-op Pain Management: Ofirmev IV (intra-op)*   Induction: Intravenous  PONV Risk Score and Plan: 2 and Ondansetron, Dexamethasone and Treatment may vary due to age or medical condition  Airway Management Planned: Oral ETT  Additional Equipment: Arterial line  Intra-op Plan:   Post-operative Plan: Extubation in OR  Informed Consent: I have reviewed the patients History and Physical, chart, labs and discussed the procedure including the risks, benefits and alternatives for the proposed anesthesia with the patient or authorized representative who has indicated his/her understanding and acceptance.     Dental advisory given  Plan Discussed with: CRNA  Anesthesia Plan Comments: (Type and screen  2PIVs)        Anesthesia Quick Evaluation

## 2022-10-25 NOTE — Anesthesia Procedure Notes (Signed)
Procedure Name: Intubation Date/Time: 10/25/2022 7:35 AM  Performed by: Dorie Rank, CRNAPre-anesthesia Checklist: Patient identified, Emergency Drugs available, Suction available, Patient being monitored and Timeout performed Patient Re-evaluated:Patient Re-evaluated prior to induction Oxygen Delivery Method: Circle system utilized Preoxygenation: Pre-oxygenation with 100% oxygen Induction Type: IV induction Ventilation: Mask ventilation without difficulty Laryngoscope Size: Mac and 4 Grade View: Grade I Tube type: Oral Tube size: 7.5 mm Number of attempts: 1 Airway Equipment and Method: Stylet Placement Confirmation: ETT inserted through vocal cords under direct vision, positive ETCO2 and breath sounds checked- equal and bilateral Secured at: 22 cm Tube secured with: Tape Dental Injury: Teeth and Oropharynx as per pre-operative assessment

## 2022-10-25 NOTE — Op Note (Signed)
NAME: Edward Campos    MRN: [id number] DOB: 1932/04/07    DATE OF OPERATION: 10/25/2022  PREOP DIAGNOSIS:    Femoral-femoral bypass graft infection  POSTOP DIAGNOSIS:    Same  PROCEDURE:    Femoral-femoral bypass graft excision Left common femoral vein primary repair Redo femorofemoral bypass using cadaveric greater saphenous vein -Right common femoral to left profunda with interposition bypass to the superficial femoral artery. Myriad Morcel placement-2 g Bilateral Prevena wound VAC placement  SURGEON: Victorino Sparrow Co-surgeon: Sherald Hess, MD  ASSIST: Evelene Croon  ANESTHESIA: General   EBL:  INDICATIONS:    Edward Campos is a 89 y.o. male with metastatic colon cancer, abdominal aortic aneurysm, infected right to left femorofemoral bypass graft.  After discussing the risks and benefits of bypass graft excision for infection, and to lower the risk of blood, Edward Campos elected to proceed.  FINDINGS:   Purulence of the mid femorofemoral graft into the left groin Redo exposure of the right common femoral artery Redo exposure of the left common femoral artery.  No tissue planes appreciated.  Very friable artery.  TECHNIQUE:   Patient was brought to the OR laid in supine position.  General anesthesia was induced and the patient was prepped draped in standard fashion.  Began with ultrasound insonation of the bilateral common femoral arteries.  These were marked to the longitudinal incisions appropriate.  I initially started in the right groin to obtain inflow control for the entirety of the lower extremities.  I worked to expose the common femoral artery, femoral-femoral bypass graft, superficial femoral artery, profunda.  All were controlled with the use of Vesseloops.  Next, the right groin.  Upon opening the right groin frank purulence was appreciated.  This was sent for culture.  I spent over an hour trying to expose the common femoral artery.  Attempts to  expose the artery, and a large left common femoral vein injury.  This was closed primarily with use of 5-0 Prolene suture.  The artery was very friable with no appreciated tissue planes.  I do very difficult time controlling the profunda.  This is initially done with the use of Fogarty occlusion balloons, however the largest profunda branch was entered laterally with Metzenbaum scissors.  My partner, Dr. Sherald Hess came at this point to offer hand with exposure..  We were able to find a relatively normal tissue plane distally, and controlled the profunda and its branches.  The artery itself was destroyed, I did not think it would take suture.  I elected to make an [place] of the profunda to sew the femoral-femoral bypass graft to.  Next, the previous dacryon femoral-femoral bypass graft was resected.  This was relatively easy due to the amount of purulence appreciated at the bypass tract.  Digital palpation was used to create a new subcutaneous plane above the patient's pubis. At this point, cadaveric vein was thawed and brought onto the field.  The vein was prepped in standard fashion and Larson was heparinized.  The right-sided inflow, SFA, profunda and bypass graft were clamped.  The bypass graft was removed in its entirety and a long segment of the greater saphenous vein was used as a patch, transitioning to the bypass proximally.  This was sewn in end-to-side fashion using 6-0 Prolene suture by my partner Dr. Chestine Spore.  Next, the clamps were removed and bypass pressurized.  The bypass was was marked to ensure there was no kinking, and then tunneled to the right groin.  In the right groin, the vein was sewn end to end to the profunda.  This was sewn in using 6-0 running Prolene suture.  Upon removal of the clamps, there was an excellent signal in the left foot, no signal in the right.  I was afraid for further revascularization may not provide enough perfusion to avoid subsequent rest pain, therefore I  elected to reimplant the SFA using a short interposition bypass 8 of the same cadaveric vein.  The femoral-femoral bypass graft was clamped, profunda clamped, and SFA clamped a small arteriotomy was made on the top of the profunda and a jump graft was sewn from the top of the profunda into into the SFA.  Please see diagram below.  At completion, there were dorsalis pedis signals bilaterally.  Heparin was reversed.  Wound beds were irrigated with antibiotic laden saline.  2 g of myriad more cell powder were placed in bilateral groins, as well as the femoral-femoral tract.  Groins were closed in layers using 2-0 Vicryl with staples at the level of the skin and Prevena vacuum dressing.    Ladonna Snide, MD [facility] DATE OF DICTATION:   10/25/2022

## 2022-10-25 NOTE — Op Note (Signed)
Date: October 25, 2022  Preoperative diagnosis: Infected right to left femoral-femoral Dacron bypass graft  Postoperative diagnosis: Same  Procedure: 1.  Redo exposure of bilateral common femoral arteries greater than 30 days 2.  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  Redo right to left femoral-femoral bypass graft with cadaveric great saphenous vein 4.  Left common femoral to left SFA interposition bypass with cadaveric great saphenous vein 5.  Myriad morcells bilateral groin wounds 6.  Bilateral Prevena wound VAC placement  Surgeon: Dr. Cephus Shelling, MD  Co-surgeon: Dr. Ladonna Snide, MD  Assistant: Nathanial Rancher, PA  Indications: 89 year old male that previously underwent right to left femorofemoral bypass graft that is now infected.  He presents for excision of the bypass graft after risks benefits discussed and will need reconstruction.  Co-surgeon was needed given the complexity of the case and also for redo groin exposures that were very scarred in place with high risk dissection and requiring multiple vascular anastomosis in both groins.  Findings: There was significant purulence in the left groin and the common femoral artery in the left groin was significantly scarred in place and friable.  Ultimately the old femorofemoral bypass graft was completely excised.  A new right to left CryoVein bypass was done with saphenous vein.  This was tunneled from the right common femoral artery through a new skin tunnel above the pubis and then sewn to the left profunda.  Initially had ligated the SFA as the artery was very friable.  There was no signal in the foot.  We then did a jump graft from the left common femoral to the SFA with cryovein and had a good signal.  Anesthesia: General  Details: Patient was taken to the operating room after informed consent was obtained.  Placed on operative table in the supine position.  General endotracheal anesthesia  was induced.  Both groins and the abdominal wall were prepped and draped in standard sterile fashion.  Dr. Karin Lieu initially exposed the right groin.  I scrubbed in and we worked together to finish exposing the left groin including the SFA and profunda and the dacron graft anastomosis onto the common femoral.  The artery in the left groin was very friable and very scarred in the groin.  Once we had vascular control in both groins, the entire old dacron femorofemoral graft was completely excised including through the old skin tunnel.  We created a new tunnel in the suprapubic region with a cuved tunneler and passed a cryo great saphenous vein.  The vein was then spatulated and once we had a therapeutic ACT we clamped the SFA and profunda in the right groin and controled the distal external iliac for inflow control.  The old dacryon graft was completely excised.  A new anastomosis was sewn to the right common femoral artery using 5-0 Prolene parachute technique with the cryo great saphenous vein.  We then had good pulsatile flow in the fem fem graft.  This was marked for orientation and then tunneled through the new tunnel.  Dr. Karin Lieu then pulled the graft to the appropriate length and it was cut and spatulated accordingly.  A new anastomosis was sewn in the left groin onto the left profunda as the artery overall was quite friable.  We did initially ligated the SFA with 5-0 Prolene.  There was no signal in the foot so we then did a jump graft from the left common femoral artery to the SFA and then had  a good signal in the left foot.  The left common femoral anastomosis was sewn end to side to the graft jumped onto the profunda.  We then did a end-to-end anastomosis to the SFA stump in the wound.  He then had a good Doppler signal.  Please see Dr. Karin Lieu dictation for remainder of the case.    Complication: None  Condition: Stable  Cephus Shelling, MD [facility] [place] Office:  [phone]   Cephus Shelling

## 2022-10-25 NOTE — Anesthesia Postprocedure Evaluation (Signed)
Anesthesia Post Note  Patient: Edward Campos  Procedure(s) Performed: EXCISION BYPASS GRAFT FEMORAL-FEMORAL ARTERY AND REDO FEMORAL-FEMORAL BYPASS GRAFT USING CRYO-SAPHENOUS VEIN 79CMLX3-6MMd (Bilateral: Groin)     Patient location during evaluation: PACU Anesthesia Type: General Level of consciousness: awake and alert Pain management: pain level controlled Vital Signs Assessment: post-procedure vital signs reviewed and stable Respiratory status: spontaneous breathing, nonlabored ventilation, respiratory function stable and patient connected to nasal cannula oxygen Cardiovascular status: blood pressure returned to baseline and stable Postop Assessment: no apparent nausea or vomiting Anesthetic complications: no   No notable events documented.  Last Vitals:  Vitals:   10/25/22 1428 10/25/22 1518  BP: 123/65 110/71  Pulse: 73 77  Resp: 11 13  Temp:    SpO2: 93% 99%    Last Pain:  Vitals:   10/25/22 1345  TempSrc:   PainSc: 0-No pain                 Bertie Nation

## 2022-10-26 ENCOUNTER — Encounter (HOSPITAL_COMMUNITY): Payer: Self-pay | Admitting: Vascular Surgery

## 2022-10-26 DIAGNOSIS — A419 Sepsis, unspecified organism: Secondary | ICD-10-CM | POA: Diagnosis not present

## 2022-10-26 DIAGNOSIS — Z515 Encounter for palliative care: Secondary | ICD-10-CM | POA: Diagnosis not present

## 2022-10-26 DIAGNOSIS — T827XXA Infection and inflammatory reaction due to other cardiac and vascular devices, implants and grafts, initial encounter: Secondary | ICD-10-CM | POA: Diagnosis not present

## 2022-10-26 DIAGNOSIS — T827XXD Infection and inflammatory reaction due to other cardiac and vascular devices, implants and grafts, subsequent encounter: Secondary | ICD-10-CM | POA: Diagnosis not present

## 2022-10-26 DIAGNOSIS — Z7189 Other specified counseling: Secondary | ICD-10-CM | POA: Diagnosis not present

## 2022-10-26 DIAGNOSIS — C3432 Malignant neoplasm of lower lobe, left bronchus or lung: Secondary | ICD-10-CM | POA: Diagnosis not present

## 2022-10-26 DIAGNOSIS — I739 Peripheral vascular disease, unspecified: Secondary | ICD-10-CM | POA: Diagnosis not present

## 2022-10-26 DIAGNOSIS — I714 Abdominal aortic aneurysm, without rupture, unspecified: Secondary | ICD-10-CM | POA: Diagnosis not present

## 2022-10-26 LAB — CBC
HCT: 25.5 % — ABNORMAL LOW (ref 39.0–52.0)
Hemoglobin: 9.1 g/dL — ABNORMAL LOW (ref 13.0–17.0)
MCH: 30.3 pg (ref 26.0–34.0)
MCHC: 35.7 g/dL (ref 30.0–36.0)
MCV: 85 fL (ref 80.0–100.0)
Platelets: 226 10*3/uL (ref 150–400)
RBC: 3 MIL/uL — ABNORMAL LOW (ref 4.22–5.81)
RDW: 16.1 % — ABNORMAL HIGH (ref 11.5–15.5)
WBC: 11.3 10*3/uL — ABNORMAL HIGH (ref 4.0–10.5)
nRBC: 0 % (ref 0.0–0.2)

## 2022-10-26 LAB — CBC WITH DIFFERENTIAL/PLATELET
Abs Immature Granulocytes: 0.22 10*3/uL — ABNORMAL HIGH (ref 0.00–0.07)
Basophils Absolute: 0 10*3/uL (ref 0.0–0.1)
Basophils Relative: 0 %
Eosinophils Absolute: 0 10*3/uL (ref 0.0–0.5)
Eosinophils Relative: 0 %
HCT: 24.8 % — ABNORMAL LOW (ref 39.0–52.0)
Hemoglobin: 8.7 g/dL — ABNORMAL LOW (ref 13.0–17.0)
Immature Granulocytes: 2 %
Lymphocytes Relative: 5 %
Lymphs Abs: 0.6 10*3/uL — ABNORMAL LOW (ref 0.7–4.0)
MCH: 30 pg (ref 26.0–34.0)
MCHC: 35.1 g/dL (ref 30.0–36.0)
MCV: 85.5 fL (ref 80.0–100.0)
Monocytes Absolute: 0.9 10*3/uL (ref 0.1–1.0)
Monocytes Relative: 8 %
Neutro Abs: 8.9 10*3/uL — ABNORMAL HIGH (ref 1.7–7.7)
Neutrophils Relative %: 85 %
Platelets: 204 10*3/uL (ref 150–400)
RBC: 2.9 MIL/uL — ABNORMAL LOW (ref 4.22–5.81)
RDW: 16.2 % — ABNORMAL HIGH (ref 11.5–15.5)
WBC: 10.6 10*3/uL — ABNORMAL HIGH (ref 4.0–10.5)
nRBC: 0 % (ref 0.0–0.2)

## 2022-10-26 LAB — COMPREHENSIVE METABOLIC PANEL
ALT: 87 U/L — ABNORMAL HIGH (ref 0–44)
AST: 55 U/L — ABNORMAL HIGH (ref 15–41)
Albumin: 2.2 g/dL — ABNORMAL LOW (ref 3.5–5.0)
Alkaline Phosphatase: 48 U/L (ref 38–126)
Anion gap: 6 (ref 5–15)
BUN: 20 mg/dL (ref 8–23)
CO2: 22 mmol/L (ref 22–32)
Calcium: 7.5 mg/dL — ABNORMAL LOW (ref 8.9–10.3)
Chloride: 99 mmol/L (ref 98–111)
Creatinine, Ser: 0.86 mg/dL (ref 0.61–1.24)
GFR, Estimated: >60 mL/min (ref >60)
Glucose, Bld: 131 mg/dL — ABNORMAL HIGH (ref 70–99)
Potassium: 4 mmol/L (ref 3.5–5.1)
Sodium: 127 mmol/L — ABNORMAL LOW (ref 135–145)
Total Bilirubin: 1 mg/dL (ref 0.3–1.2)
Total Protein: 4.6 g/dL — ABNORMAL LOW (ref 6.5–8.1)

## 2022-10-26 LAB — BASIC METABOLIC PANEL
Anion gap: 7 (ref 5–15)
BUN: 17 mg/dL (ref 8–23)
CO2: 22 mmol/L (ref 22–32)
Calcium: 7.7 mg/dL — ABNORMAL LOW (ref 8.9–10.3)
Chloride: 99 mmol/L (ref 98–111)
Creatinine, Ser: 0.76 mg/dL (ref 0.61–1.24)
GFR, Estimated: >60 mL/min (ref >60)
Glucose, Bld: 116 mg/dL — ABNORMAL HIGH (ref 70–99)
Potassium: 3.8 mmol/L (ref 3.5–5.1)
Sodium: 128 mmol/L — ABNORMAL LOW (ref 135–145)

## 2022-10-26 LAB — LIPID PANEL
Cholesterol: 58 mg/dL (ref 0–200)
HDL: 15 mg/dL — ABNORMAL LOW (ref >40)
LDL Cholesterol: 29 mg/dL (ref 0–99)
Total CHOL/HDL Ratio: 3.9 ratio
Triglycerides: 68 mg/dL (ref <150)
VLDL: 14 mg/dL (ref 0–40)

## 2022-10-26 LAB — MAGNESIUM: Magnesium: 1.7 mg/dL (ref 1.7–2.4)

## 2022-10-26 MED ORDER — CHLORHEXIDINE GLUCONATE 0.12 % MT SOLN
15.0000 mL | Freq: Two times a day (BID) | OROMUCOSAL | Status: AC
  Administered 2022-10-26 – 2022-10-28 (×5): 15 mL via OROMUCOSAL
  Filled 2022-10-26 (×6): qty 15

## 2022-10-26 MED ORDER — MAGNESIUM SULFATE 2 GM/50ML IV SOLN
2.0000 g | Freq: Once | INTRAVENOUS | Status: AC
  Filled 2022-10-26: qty 50

## 2022-10-26 MED ORDER — SODIUM CHLORIDE 0.9 % IV SOLN: 2.0000 g | INTRAVENOUS | Status: DC

## 2022-10-26 MED ORDER — SODIUM CHLORIDE 0.9 % IV SOLN
8.0000 mg/kg | Freq: Every day | INTRAVENOUS | Status: DC
  Administered 2022-10-26 – 2022-11-12 (×18): 600 mg via INTRAVENOUS
  Filled 2022-10-26 (×19): qty 12

## 2022-10-26 MED ORDER — SORBITOL 70 % SOLN
30.0000 mL | Freq: Once | Status: AC
  Administered 2022-10-26: 30 mL via ORAL
  Filled 2022-10-26: qty 30

## 2022-10-26 NOTE — Consult Note (Signed)
[facility]    Date of Admission:  10/22/2022     Total days of antibiotics 4   Vanc 8/13 >>   Cefepime 8/13 >>           Reason for Consult: vascular graft infection     Referring Provider: Janee Morn / [facility] Primary Care Provider: Patient, No Pcp Per   Assessment: Edward Campos is a 89 y.o. male admitted from [facility] office for surgical management of infected Fem-Fem BPG. S/P I&D in June of this year with empiric oral abx x 2 weeks and local wound care. Did OK until August where he developed worsened bloody-puurlent drainage and fevers with local cellulitis. Now S/P Redo with cadaver tissue grafts.   Vascular Graft Infection -  S/P removal 8/15 and replacement of cadavar bpg tissue -   -staphylococcus aureus growing on cultures. MRSA & MSSA PCR + from nares.  -Change to daptomycin monotherapy    Vascular Access -  -Likely will require at least a partial course of IV antibiotics. Will need to talk more about this with him. He lives alone at home.  -BCx collected 8/13 no growth preliminary.    SCLC -  -has not started treatment plan for this yet. Required PNA tx 2x this year for post-obstructive PNA.    Dr. Renold Don is on this weekend for ID coverage and will further adjust antibiotics based on susceptibilities.    Plan: Change abx to daptomycin to cover for MSSA/MRSA  Follow susceptibilities  Baseline CK    Principal Problem:   Infection of vascular bypass graft (HCC) Active Problems:   Peripheral vascular disease (HCC)   Small cell lung cancer, left lower lobe (HCC)   Sepsis (HCC)   Hypokalemia   Transaminitis   Hyponatremia   AAA (abdominal aortic aneurysm) (HCC)   PAD (peripheral artery disease) (HCC)    aspirin EC  81 mg Oral Daily   chlorhexidine  15 mL Mouth/Throat BID   Chlorhexidine Gluconate Cloth  6 each Topical Q0600   docusate sodium  100 mg Oral Daily   heparin  5,000 Units Subcutaneous Q8H   multivitamin with minerals   1 tablet Oral Daily   mupirocin ointment  1 Application Nasal BID   pantoprazole  40 mg Oral Daily   saccharomyces boulardii  250 mg Oral BID   sodium chloride flush  3 mL Intravenous Q12H    HPI: Edward Campos is a 89 y.o. male admitted directly from vascular office for medical/surgical management of vascular graft infection.   PMHx: AAA, BPH, anxiety, PVD, SCLC.  06/08/2019: Originally Dr. Darrick Penna performed Right common femoral external iliac endarterectomy, right common (9 x 39 VBX) and external iliac stent 8 x 59 VBX, 8 x 40 Inova, right to left femoral-femoral bypass, left external iliac common femoral endarterectomy, patch angioplasty right common femoral artery Dacron for Sever Claudication symptoms.     Recent Hx: 07/06/19 - required I&D of left groin with VAC - clear fluid / seroma formation. 08/16/22 - Washout of wound with placement of fish skin graft and vac to close wound. Sent out on oral doxycycline + cefpodoxime for 14d to treat post obstructive PNA and exposed/infected graft.  Followed closely with [facility]VS outpatient with good improvement with wound care and oral abx.  Incidentally found lung mass which was confirmed on biopsy SCLC.  Required another hospital stay for pneumonia 7/15 where he was discharged on cefdinir x 5d.  Early August  he messaged the [facility]VS team and mentioned that there was increased seeping and blood.  Seen in the office 8/12 - experiencing fevers and purulent drainage. Direct admitted for surgical management. 8/15 underwent excision of fem-fem bypass graft. Op note reviewed - old infected dacron grafts removed and redo R-L Fem-Fem bpg with cadaveric GSV, L common Femoral to L SFA with cadaveric bpg.   Op cultures growing staph aureus - wound vac in place.    Review of Systems: Review of Systems  Constitutional:  Positive for chills and fever. Negative for malaise/fatigue.  Cardiovascular:  Negative for leg swelling.  Gastrointestinal:  Negative for abdominal  pain, nausea and vomiting.    Past Medical History:  Diagnosis Date   AAA (abdominal aortic aneurysm) (HCC)    Anxiety    Arthritis    BPH (benign prostatic hyperplasia)    Dizziness    Hyperlipidemia    PVD (peripheral vascular disease) (HCC)    S/p fem-fem bypass grafting 08/2022   SCLC (small cell lung carcinoma), left (HCC)     Social History   Tobacco Use   Smoking status: Former    Current packs/day: 0.00    Average packs/day: 1.5 packs/day for 70.0 years (105.0 ttl pk-yrs)    Types: Cigarettes    Start date: 05/29/1949    Quit date: 05/30/2019    Years since quitting: 3.4   Smokeless tobacco: Never  Vaping Use   Vaping status: Never Used  Substance Use Topics   Alcohol use: Yes    Alcohol/week: 3.0 standard drinks of alcohol    Types: 3 Shots of liquor per week    Comment: 3 2oz. shots of bourbon/day   Drug use: No    Family History  Problem Relation Age of Onset   Stroke Mother    Pseudochol deficiency Neg Hx    Malignant hyperthermia Neg Hx    Hypotension Neg Hx    Anesthesia problems Neg Hx    Allergies  Allergen Reactions   Penicillins Other (See Comments)    OBJECTIVE: Blood pressure (!) 96/55, pulse 85, temperature (!) 97.5 F (36.4 C), temperature source Axillary, resp. rate 15, height 5\' 9"  (1.753 m), weight 75.9 kg, SpO2 97%.  Physical Exam Constitutional:      Appearance: Normal appearance.  Cardiovascular:     Rate and Rhythm: Normal rate.  Pulmonary:     Effort: Pulmonary effort is normal.  Abdominal:     General: Bowel sounds are normal.     Palpations: Abdomen is soft.  Skin:    General: Skin is warm and dry.     Comments: Left groin with clean vac dressing in place.   Neurological:     Mental Status: He is alert.     Lab Results Lab Results  Component Value Date   WBC 11.3 (H) 10/26/2022   HGB 9.1 (L) 10/26/2022   HCT 25.5 (L) 10/26/2022   MCV 85.0 10/26/2022   PLT 226 10/26/2022    Lab Results  Component Value Date    CREATININE 0.76 10/26/2022   BUN 17 10/26/2022   NA 128 (L) 10/26/2022   K 3.8 10/26/2022   CL 99 10/26/2022   CO2 22 10/26/2022    Lab Results  Component Value Date   ALT 87 (H) 10/26/2022   AST 55 (H) 10/26/2022   ALKPHOS 48 10/26/2022   BILITOT 1.0 10/26/2022     Microbiology: Recent Results (from the past 240 hour(s))  Surgical pcr screen  Status: Abnormal   Collection Time: 10/23/22  7:44 AM   Specimen: Nasal Mucosa; Nasal Swab  Result Value Ref Range Status   MRSA, PCR POSITIVE (A) NEGATIVE Final    Comment: RESULT CALLED TO, READ BACK BY AND VERIFIED WITH: RN MIA RINAL [id number] AT 1029 BY EC    Staphylococcus aureus POSITIVE (A) NEGATIVE Final    Comment: (NOTE) The Xpert SA Assay (FDA approved for NASAL specimens in patients 8 years of age and older), is one component of a comprehensive surveillance program. It is not intended to diagnose infection nor to guide or monitor treatment. Performed at [facility], [address] [place], [place] [address]   Culture, blood (Routine X 2) w Reflex to ID Panel     Status: None (Preliminary result)   Collection Time: 10/23/22 12:24 PM   Specimen: BLOOD  Result Value Ref Range Status   Specimen Description BLOOD RIGHT ANTECUBITAL  Final   Special Requests Blood Culture adequate volume  Final   Culture   Final    NO GROWTH 3 DAYS Performed at [facility], [address], [place], [place] [address]    Report Status PENDING  Incomplete  Culture, blood (Routine X 2) w Reflex to ID Panel     Status: None (Preliminary result)   Collection Time: 10/23/22 12:24 PM   Specimen: BLOOD  Result Value Ref Range Status   Specimen Description BLOOD BLOOD RIGHT FOREARM  Final   Special Requests   Final    BOTTLES DRAWN AEROBIC AND ANAEROBIC Blood Culture adequate volume   Culture   Final    NO GROWTH 3 DAYS Performed at [facility], [address] [place], [place] [address]    Report Status PENDING   Incomplete  Aerobic/Anaerobic Culture w Gram Stain (surgical/deep wound)     Status: None (Preliminary result)   Collection Time: 10/25/22 12:35 PM   Specimen: Path fluid; Tissue  Result Value Ref Range Status   Specimen Description FLUID  Final   Special Requests infected bilateral femoral graft  Final   Gram Stain   Final    ABUNDANT WBC PRESENT, PREDOMINANTLY PMN ABUNDANT GRAM POSITIVE COCCI IN PAIRS IN CLUSTERS    Culture   Final    MODERATE STAPHYLOCOCCUS AUREUS SUSCEPTIBILITIES TO FOLLOW Performed at [facility], [address] [place], [place] [address]    Report Status PENDING  Incomplete    Rexene Alberts, MSN, NP-C [facility] Pager: [phone]  10/26/2022 9:55 AM

## 2022-10-26 NOTE — Progress Notes (Signed)
   Palliative Medicine Inpatient Follow Up Note  HPI: 89 y.o. male   admitted on 10/22/2022 with history of a right to left femorofemoral bypass with common and external iliac artery stenting on the right with a known abdominal aortic aneurysm.  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 Palliative care support given patient acute on chronic disease burden.   Today's Discussion 10/26/2022  *Please note that this is a verbal dictation therefore any spelling or grammatical errors are due to the "Dragon [employer] One" system interpretation.  Chart reviewed inclusive of vital signs, progress notes, laboratory results, and diagnostic images. Has required no additional pain medications overnight.  Reviewed [name] PMH inclusive of metastatic colon cancer, abdominal aortic aneurysm, infected right to left femorofemoral bypass graft.   I met with Axle at bedside this morning. He was sitting up in the chair and expresses that he spent roughly two hours with OT this morning. He is in good spirits despite sleeping poorly. Cordarrell shares that the vascular surgery team "took the staph out and gave blood back" in relation to his procedure knowledge.He shares he does feel better overall "for the first time in a long time."  Terrill complains of constant interruptions overnight. He shares that he felt staff was not caring towards his needs on the evening shift. Created space and opportunity for patient to explore thoughts feelings and fears regarding current medical situation.  Westley denies pain, shortness of breath, or nausea this morning. He does share that he has some weakness as a result of his hospital stay to date.   We discussed [place] goals at this time which remain to be geared towards improvement.  I clarified code status which remains to be full code and full scope of care presently. Provided  "Hard Choices for [employer]" booklet as well as a MOST for for review.    Questions and concerns addressed/Palliative Support Provided.   Objective Assessment: Vital Signs Vitals:   10/26/22 0500 10/26/22 0832  BP: 104/68 (!) 96/55  Pulse: 79 85  Resp: 16 15  Temp: 98.1 F (36.7 C) (!) 97.5 F (36.4 C)  SpO2: 100% 97%    Intake/Output Summary (Last 24 hours) at 10/26/2022 1133 Last data filed at 10/26/2022 0600 Gross per 24 hour  Intake 973.25 ml  Output 1000 ml  Net -26.75 ml   Last Weight  Most recent update: 10/25/2022  6:53 AM    Weight  75.9 kg (167 lb 5.3 oz)            Gen:  Elderly Caucasian M in NAD HEENT: moist mucous membranes CV: Regular rate and rhythm  PULM: On RA, breathing is even and nonlabored ABD: soft/nontender  EXT: No edema  Neuro: Alert and oriented x3   SUMMARY OF RECOMMENDATIONS   Full Code/ Full Scope of Care -> Hard Choices booklet and MOST provided for review  Patient feels improved post procedure   Goals are for ongoing improvement/recovery   Ongoing PMT support  Billing based on MDM: High ______________________________________________________________________________________ Lamarr Lulas [place] Palliative Medicine Team Team Cell Phone: [phone] Please utilize secure chat with additional questions, if there is no response within 30 minutes please call the above phone number  Palliative Medicine Team providers are available by phone from 7am to 7pm daily and can be reached through the team cell phone.  Should this patient require assistance outside of these hours, please call the patient's attending physician.

## 2022-10-26 NOTE — Progress Notes (Signed)
PROGRESS NOTE    Edward Campos  YQM:[id number] DOB: 10/15/1932 DOA: 10/22/2022 PCP: Patient, No Pcp Per   No chief complaint on file.   Brief Narrative:  Edward Campos is a 89 y.o. male with past medical history of HLD, PVD s/p bypass grafting, AAA, SCLC who presented as a direct admission to the vascular service for concern a infected femoral bypass.  Patient had been hospitalized in June for infected right to left femoral to femoral bypass graft with temporary wound VAC placement.  Over the last week he had noticed  increased swelling, pain, bloody/possible pus drainage, reported fevers up to 101 F at home.  Records note he was advised to go to the emergency department for further evaluation, but ended up followed up in the office on 8/12.  Orders were placed to admit the patient directly to the hospital for need of removal of the graft.  Patient was noted to be afebrile with pulse 84-109, and all other vital signs relatively maintained.  Labs from yesterday significant for WBC 13.2, hemoglobin 11.5, sodium 122, calcium, and ALT 46.  MRSA PCR screen was positive.  CT angiogram of the abdomen pelvis noted right bifemoral bypass graft with fluid/inflammatory changes associated with the femorofemoral bypass most prominent on the left compatible with a graft infection, and a 5.8 infrarenal abdominal aortic aneurysm grossly unchanged in size.  Patient had been started on empiric antibiotics of vancomycin and cefepime.  He had been posted for graft excision today.  However, patient declined having graft removed this morning as he wanted time to think about his options.  He is amendable to having the graft removed at this time.  Vascular surgery asked [facility] to assume care as patient due to other comorbidities.   Patient also reports that he is continue to have a intermittently productive cough since admitted in the hospital back in July with a postobstructive pneumonia.  Denies coughing up any blood.  He has  not been able to start chemotherapy due to his wounds not being completely healed.   Assessment & Plan:   Principal Problem:   Infection of vascular bypass graft (HCC) Active Problems:   Sepsis (HCC)   Peripheral vascular disease (HCC)   Small cell lung cancer, left lower lobe (HCC)   Hypokalemia   Transaminitis   Hyponatremia   AAA (abdominal aortic aneurysm) (HCC)   PAD (peripheral artery disease) (HCC)  #1 sepsis secondary to infected left femoral graft, POA -Patient met criteria for sepsis on admission with tachycardia, leukocytosis, fevers of 101 Fahrenheit prior to admission. -MRSA screening noted to be positive. -CT angiogram abdomen and pelvis noted right bifemoral bypass graft with fluid/inflammatory changes associated with femoral-femoral bypass most prominent on the left compatible with graft infection. -Blood cultures and lactic acid levels obtained and pending. -Patient noted to have received IV Vanco and cefepime prior to blood cultures being drawn. -Patient seen by vascular surgery who initially had planned on removing graft, patient noted to have initially declined needing more time to think about it and patient subsequently agreed for graft removal.   -Patient underwent excision of infected right to left femorofemoral bypass graft with redo right to left femorofemoral bypass graft with cadaveric great saphenous vein, left common femoral to left SFA interposition bypass with cadaveric great saphenous vein, redo exposure of bilateral common femoral arteries greater than 30 days, my read more cells bilateral groin wounds, bilateral Prevena wound VAC placement.(10/25/2022) -Significant purulence noted in the left groin and the common femoral  artery in the left groin significantly scarred in place and friable per op note findings.  Cultures sent with preliminary results with moderate staff aureus.   -Patient was on IV Vanco and IV cefepime.   -Patient seen in consultation by ID  and antibiotics changed to IV daptomycin monotherapy.   -ID following and appreciate input and recommendations.   -Per vascular surgery.    2.  Acute on chronic hyponatremia -Baseline sodium 128-131. -On initial presentation sodium noted at 122 with repeat at 126. -Patient with history of small cell lung cancer. -Sodium currently at 127 this morning.  3.  Hypokalemia -Repleted.   -Potassium at 3.8 today, magnesium at 1.7.   -Magnesium sulfate 2 g IV x 1.  -Repeat labs in the AM.  4.  Small cell lung cancer with bone metastases -Patient noted stage IV lung cancer with disease to the bone and prior postobstructive pneumonia. -CT scan done with branching masslike opacity measuring 5.2 x 7.9 cm in the left hilar region/central left lower lobe similar in size to prior. -Chemotherapy noted not to have been initiated due to wounds not being healed yet. -Palliative care consulted for goals of care. -Outpatient follow-up with primary oncologist.  5.  PAD -Continue aspirin. -Statin discontinued due to transaminitis.   -Plavix held, vascular surgery to advise when Plavix may be resumed.  6.  Transaminitis -??  Etiology. -CT angiogram abdomen and pelvis with no acute abnormalities noted in the left. -LFTs were trending up but trending back down. -Acute hepatitis panel negative.   -Crestor discontinued.   -Follow.  7.  Hyperlipidemia -Crestor held due to rising LFTs.    8.  AAA -Patient with 5.8 x 5.7 cm infrarenal abdominal aortic aneurysm unchanged in size. -Vascular surgery following.     DVT prophylaxis: Heparin Code Status: Full Family Communication: Updated patient, no family at bedside.. Disposition: SNF versus home with home health.   Status is: Inpatient Remains inpatient appropriate because: Severity of illness   Consultants:  Palliative care: Lorinda Creed, NP 10/24/2022 [facility] hospitalist: Dr. Katrinka Blazing 10/23/2022 Vascular surgery ID:: Dr. Daiva Eves  10/26/2022  Procedures:  CT angiogram abdomen and pelvis 10/22/2022 1.  Redo exposure of bilateral common femoral arteries greater than 30 days 2.  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  Redo right to left femoral-femoral bypass graft with cadaveric great saphenous vein 4.  Left common femoral to left SFA interposition bypass with cadaveric great saphenous vein 5.  Myriad morcells bilateral groin wounds 6.  Bilateral Prevena wound VAC placement ----Per vascular surgery: Dr. Chestine Spore and Dr. Sherral Hammers 10/25/2022.  Antimicrobials: Anti-infectives (From admission, onward)    Start     Dose/Rate Route Frequency Ordered Stop   10/26/22 0945  DAPTOmycin (CUBICIN) 600 mg in sodium chloride 0.9 % IVPB        8 mg/kg  75.9 kg 124 mL/hr over 30 Minutes Intravenous Daily 10/26/22 0855     10/26/22 0945  cefTRIAXone (ROCEPHIN) 2 g in sodium chloride 0.9 % 100 mL IVPB  Status:  Discontinued        2 g 200 mL/hr over 30 Minutes Intravenous Every 24 hours 10/26/22 0855 10/26/22 0905   10/25/22 1006  ceFAZolin 1 g / gentamicin 80 mg in NS 500 mL surgical irrigation  Status:  Discontinued          As needed 10/25/22 1006 10/25/22 1318   10/23/22 1145  vancomycin (VANCOREADY) IVPB 1250 mg/250 mL  Status:  Discontinued  1,250 mg 166.7 mL/hr over 90 Minutes Intravenous Every 24 hours 10/23/22 1051 10/26/22 0855   10/23/22 1100  ceFEPIme (MAXIPIME) 2 g in sodium chloride 0.9 % 100 mL IVPB  Status:  Discontinued        2 g 200 mL/hr over 30 Minutes Intravenous Every 12 hours 10/23/22 1041 10/26/22 0855         Subjective: Patient laying in bed.  Overall feels well.  No abdominal pain improving.  No chest pain.  No shortness of breath.  Some complaints of constipation.  Tolerating current diet.  Denies any bleeding.    Objective: Vitals:   10/26/22 0230 10/26/22 0500 10/26/22 0832 10/26/22 1155  BP: 102/71 104/68 (!) 96/55 123/78  Pulse: 76 79 85 60  Resp: 16 16 15 15    Temp: 98 F (36.7 C) 98.1 F (36.7 C) (!) 97.5 F (36.4 C) 97.8 F (36.6 C)  TempSrc: Oral Oral Axillary Oral  SpO2: 98% 100% 97% 100%  Weight:      Height:        Intake/Output Summary (Last 24 hours) at 10/26/2022 1254 Last data filed at 10/26/2022 0600 Gross per 24 hour  Intake 973.25 ml  Output 800 ml  Net 173.25 ml   Filed Weights   10/23/22 0431 10/25/22 0645 10/25/22 0651  Weight: 75.9 kg 75.9 kg 75.9 kg    Examination:  General exam: NAD Respiratory system: Lungs clear to auscultation bilaterally.  No wheezes, no crackles, no rhonchi.  Fair air movement.  Speaking in full sentences.  Cardiovascular system: RRR no murmurs rubs or gallops.  No JVD.  No lower extremity edema.  Gastrointestinal system: Abdomen is soft, nontender, nondistended, positive bowel sounds.  Bilateral wound vacs in lower abdominal region. Central nervous system: Alert and oriented. No focal neurological deficits. Extremities: Symmetric 5 x 5 power. Skin: No rashes, lesions or ulcers Psychiatry: Judgement and insight appear normal. Mood & affect appropriate.     Data Reviewed: I have personally reviewed following labs and imaging studies  CBC: Recent Labs  Lab 10/24/22 0940 10/25/22 0524 10/25/22 1001 10/25/22 1056 10/25/22 1145 10/25/22 1454 10/26/22 0120 10/26/22 0748  WBC 14.5* 10.6*  --   --   --  13.4* 10.6* 11.3*  NEUTROABS 12.0* 8.7*  --   --   --   --  8.9*  --   HGB 12.6* 11.1*   < > 8.2* 9.5* 9.7* 8.7* 9.1*  HCT 35.3* 31.5*   < > 24.0* 28.0* 28.1* 24.8* 25.5*  MCV 83.1 82.7  --   --   --  84.1 85.5 85.0  PLT 278 254  --   --   --  215 204 226   < > = values in this interval not displayed.    Basic Metabolic Panel: Recent Labs  Lab 10/23/22 1224 10/24/22 0940 10/25/22 0524 10/25/22 1001 10/25/22 1056 10/25/22 1145 10/25/22 1454 10/26/22 0120 10/26/22 0748  NA 126* 126* 127* 128* 128* 128*  --  127* 128*  K 3.4* 3.2* 4.3 4.3 4.7 4.6  --  4.0 3.8  CL 90* 92*  96*  --   --   --   --  99 99  CO2 23 23 25   --   --   --   --  22 22  GLUCOSE 157* 129* 118*  --   --   --   --  131* 116*  BUN 21 20 19   --   --   --   --  20 17  CREATININE 0.93 0.99 0.86  --   --   --  0.92 0.86 0.76  CALCIUM 8.2* 8.4* 8.1*  --   --   --   --  7.5* 7.7*  MG  --  1.9 1.8  --   --   --   --  1.7  --     GFR: Estimated Creatinine Clearance: 65.1 mL/min (by C-G formula based on SCr of 0.76 mg/dL).  Liver Function Tests: Recent Labs  Lab 10/22/22 1527 10/23/22 1224 10/24/22 0940 10/25/22 0524 10/26/22 0120  AST 41 57* 85* 110* 55*  ALT 46* 55* 89* 133* 87*  ALKPHOS 77 78 81 82 48  BILITOT 1.1 1.3* 0.9 0.9 1.0  PROT 6.0* 5.8* 6.0* 5.4* 4.6*  ALBUMIN 2.9* 2.6* 2.6* 2.2* 2.2*    CBG: No results for input(s): "GLUCAP" in the last 168 hours.   Recent Results (from the past 240 hour(s))  Surgical pcr screen     Status: Abnormal   Collection Time: 10/23/22  7:44 AM   Specimen: Nasal Mucosa; Nasal Swab  Result Value Ref Range Status   MRSA, PCR POSITIVE (A) NEGATIVE Final    Comment: RESULT CALLED TO, READ BACK BY AND VERIFIED WITH: RN MIA RINAL [id number] AT 1029 BY EC    Staphylococcus aureus POSITIVE (A) NEGATIVE Final    Comment: (NOTE) The Xpert SA Assay (FDA approved for NASAL specimens in patients 22 years of age and older), is one component of a comprehensive surveillance program. It is not intended to diagnose infection nor to guide or monitor treatment. Performed at [facility], [address], [place], [place] [address]   Culture, blood (Routine X 2) w Reflex to ID Panel     Status: None (Preliminary result)   Collection Time: 10/23/22 12:24 PM   Specimen: BLOOD  Result Value Ref Range Status   Specimen Description BLOOD RIGHT ANTECUBITAL  Final   Special Requests Blood Culture adequate volume  Final   Culture   Final    NO GROWTH 3 DAYS Performed at [facility], [address], [place], [place] [address]    Report Status  PENDING  Incomplete  Culture, blood (Routine X 2) w Reflex to ID Panel     Status: None (Preliminary result)   Collection Time: 10/23/22 12:24 PM   Specimen: BLOOD  Result Value Ref Range Status   Specimen Description BLOOD BLOOD RIGHT FOREARM  Final   Special Requests   Final    BOTTLES DRAWN AEROBIC AND ANAEROBIC Blood Culture adequate volume   Culture   Final    NO GROWTH 3 DAYS Performed at [facility], [address], [place], [place] [address]    Report Status PENDING  Incomplete  Aerobic/Anaerobic Culture w Gram Stain (surgical/deep wound)     Status: None (Preliminary result)   Collection Time: 10/25/22 12:35 PM   Specimen: Path fluid; Tissue  Result Value Ref Range Status   Specimen Description FLUID  Final   Special Requests infected bilateral femoral graft  Final   Gram Stain   Final    ABUNDANT WBC PRESENT, PREDOMINANTLY PMN ABUNDANT GRAM POSITIVE COCCI IN PAIRS IN CLUSTERS    Culture   Final    MODERATE STAPHYLOCOCCUS AUREUS SUSCEPTIBILITIES TO FOLLOW Performed at [facility], [address] [place], [place] [address]    Report Status PENDING  Incomplete         Radiology Studies: No  results found.      Scheduled Meds:  aspirin EC  81 mg Oral Daily   chlorhexidine  15 mL Mouth/Throat BID   Chlorhexidine Gluconate Cloth  6 each Topical Q0600   docusate sodium  100 mg Oral Daily   heparin  5,000 Units Subcutaneous Q8H   multivitamin with minerals  1 tablet Oral Daily   mupirocin ointment  1 Application Nasal BID   pantoprazole  40 mg Oral Daily   saccharomyces boulardii  250 mg Oral BID   sodium chloride flush  3 mL Intravenous Q12H   Continuous Infusions:  sodium chloride     sodium chloride     sodium chloride 100 mL/hr at 10/26/22 0323   DAPTOmycin (CUBICIN) 600 mg in sodium chloride 0.9 % IVPB 600 mg (10/26/22 1137)   lactated ringers       LOS: 4 days    Time spent: 40 minutes    Ramiro Harvest, MD [facility]   To contact the attending provider between 7A-7P or the covering provider during after hours 7P-7A, please log into the web site [email or web address] and access using universal [place] password for that web site. If you do not have the password, please call the hospital operator.  10/26/2022, 12:54 PM

## 2022-10-26 NOTE — Evaluation (Signed)
Physical Therapy Evaluation Patient Details Name: Edward Campos MRN: [id number] DOB: 11-09-1932 Today's Date: 10/26/2022  History of Present Illness  Pt is an 89 y/o M admitted on 10/22/22 after presenting as a direct admission for concern of infected femoral bypass. CT angiogram abdomen and pelvis noted right bifemoral bypass graft with fluid/inflammatory changes associated with femoral-femoral bypass most prominent on the left compatible with graft infection. On 10/25/22 pt underwent excision of infected right to left femorofemoral bypass graft with redo right to left femorofemoral bypass graft with cadaveric great saphenous vein, left common femoral to left SFA interposition bypass with cadaveric great saphenous vein, redo exposure of bilateral common femoral arteries greater than 30 days, my read more cells bilateral groin wounds, bilateral Prevena wound VAC placement. PMH: HLD, PVD s/p bypass grafting, AAA, SCLC with bone metastasis  Clinical Impression  Pt seen for PT evaluation with pt agreeable to tx, daughter Neysa Bonito) present in room. Pt reports prior to admission he was independent without AD, living alone, still driving. On this date, pt is able to ambulate with RW & supervision but experiences 1 LOB with min assist to correct. Pt is not at his current level of function of ambulatory without AD & requires RW to maintain safety with mobility at this time. Pt would benefit from ongoing skilled PT treatment to address balance & safety with mobility gait & stair negotiation, to reduce fall risk prior to return home alone (pt has minimal to no assistance upon d/c & is a great fall risk if he returns home alone without any assistance).        If plan is discharge home, recommend the following: A little help with walking and/or transfers;A little help with bathing/dressing/bathroom;Assistance with cooking/housework;Assist for transportation;Help with stairs or ramp for entrance   Can travel by  private vehicle   Yes    Equipment Recommendations Rolling walker (2 wheels)  Recommendations for Other Services       Functional Status Assessment Patient has had a recent decline in their functional status and demonstrates the ability to make significant improvements in function in a reasonable and predictable amount of time.     Precautions / Restrictions Precautions Precautions: Fall;Other (comment) Precaution Comments: B groin wound vacs Restrictions Weight Bearing Restrictions: No      Mobility  Bed Mobility Overal bed mobility: Needs Assistance Bed Mobility: Supine to Sit     Supine to sit: Supervision, Used rails, HOB elevated     General bed mobility comments: reliant on HOB elevated, elevating it even more prior to transitioning to sitting EOB    Transfers Overall transfer level: Needs assistance Equipment used: Rolling walker (2 wheels) Transfers: Sit to/from Stand Sit to Stand: Supervision, Contact guard assist           General transfer comment: STS from EOB with RW, cuing for hand placement but pt with poor demo of safe hand placement during stand>sit & poor eccentric lowering, plopping onto bed & posterior    Ambulation/Gait Ambulation/Gait assistance: Supervision Gait Distance (Feet): 250 Feet Assistive device: Rolling walker (2 wheels) Gait Pattern/deviations: Decreased step length - left, Decreased step length - right, Decreased stride length Gait velocity: decreased     General Gait Details: Pt lets go of RW x 1 with LOB to L with min assist to correct, pt reports it feels good on B wound vacs to get out & walk.  Stairs            Psychologist, prison and probation services  Tilt Bed    Modified Rankin (Stroke Patients Only)       Balance Overall balance assessment: Needs assistance Sitting-balance support: Bilateral upper extremity supported, Single extremity supported, Feet supported Sitting balance-Leahy Scale: Fair     Standing balance  support: Bilateral upper extremity supported, During functional activity, Reliant on assistive device for balance, Single extremity supported Standing balance-Leahy Scale: Poor                               Pertinent Vitals/Pain Pain Assessment Pain Assessment: Faces Faces Pain Scale: Hurts a little bit Pain Location: B groin wound vac discomfort Pain Descriptors / Indicators: Discomfort Pain Intervention(s): Monitored during session    Home Living Family/patient expects to be discharged to:: Private residence Living Arrangements: Alone Available Help at Discharge:  (limited as his daughter lives an hour away) Type of Home: House Home Access: Stairs to enter Entrance Stairs-Rails: Doctor, general practice of Steps: 8 steps from garage Alternate Level Stairs-Number of Steps: Split Level - 6 to bedroom Home Layout: Multi-level Home Equipment: Agricultural consultant (2 wheels)      Prior Function Prior Level of Function : Independent/Modified Independent;Driving             Mobility Comments: Denies falls ADLs Comments: At baseline, pt is Independent with all ADLs and IADLs, drives, does yard work     Extremity/Trunk Assessment   Upper Extremity Assessment Upper Extremity Assessment: Overall WFL for tasks assessed    Lower Extremity Assessment Lower Extremity Assessment: Generalized weakness    Cervical / Trunk Assessment Cervical / Trunk Assessment: Normal  Communication   Communication Communication: Hearing impairment (mild HOH)  Cognition Arousal: Alert Behavior During Therapy: WFL for tasks assessed/performed Overall Cognitive Status: Within Functional Limits for tasks assessed                                          General Comments General comments (skin integrity, edema, etc.): BLE groin wound vacs intact    Exercises     Assessment/Plan    PT Assessment Patient needs continued PT services  PT Problem List  Decreased strength;Decreased mobility;Decreased balance;Decreased safety awareness;Decreased activity tolerance;Decreased knowledge of use of DME;Pain       PT Treatment Interventions DME instruction;Gait training;Neuromuscular re-education;Balance training;Stair training;Functional mobility training;Manual techniques;Therapeutic exercise;Therapeutic activities;Patient/family education    PT Goals (Current goals can be found in the Care Plan section)  Acute Rehab PT Goals Patient Stated Goal: rehab, get better PT Goal Formulation: With patient Time For Goal Achievement: 11/09/22 Potential to Achieve Goals: Good    Frequency Min 1X/week     Co-evaluation               AM-PAC PT "6 Clicks" Mobility  Outcome Measure Help needed turning from your back to your side while in a flat bed without using bedrails?: None Help needed moving from lying on your back to sitting on the side of a flat bed without using bedrails?: A Little Help needed moving to and from a bed to a chair (including a wheelchair)?: A Little Help needed standing up from a chair using your arms (e.g., wheelchair or bedside chair)?: A Little Help needed to walk in hospital room?: A Little Help needed climbing 3-5 steps with a railing? : A Little 6 Click Score: 19    End  of Session   Activity Tolerance: Patient tolerated treatment well Patient left: in bed;with bed alarm set;with call bell/phone within reach;with family/visitor present Nurse Communication: Mobility status PT Visit Diagnosis: Unsteadiness on feet (R26.81);Muscle weakness (generalized) (M62.81)    Time: 2725-3664 PT Time Calculation (min) (ACUTE ONLY): 19 min   Charges:   PT Evaluation $PT Eval Low Complexity: 1 Low   PT General Charges $$ ACUTE PT VISIT: 1 Visit         Aleda Grana, PT, DPT 10/26/22, 1:41 PM   Sandi Mariscal 10/26/2022, 1:39 PM

## 2022-10-26 NOTE — Evaluation (Signed)
Occupational Therapy Evaluation Patient Details Name: Edward Campos MRN: [id number] DOB: 1932/10/26 Today's Date: 10/26/2022   History of Present Illness Pt is a 89 y.o. male admitted to [facility] on 10/22/2022 with Infected right to left femoral-femoral Dacron bypass graft. On 10/25/22, pt underwent femoral bypass graft excision with Left common femoral vein primary repair, redo of femorofemoral bypass, Right common femoral to left profunda with interposition bypass to the superficial femoral artery, myriad Morcel placement, and Bilateral Prevena wound VAC placement.  Recent hospitalizations in June for infected right to left femoral to femoral bypass graft and in July 2024 with a postobstructive pneumonia. PMH significant for HLD, PVD s/p bypass grafting, AAA, SCLC (diagnosed June 2024).   Clinical Impression   At baseline, pt is Independent with ADLs, IADLs, and functional mobility with an AD and drives. Pt now presents with decreased activity tolerance, decreased sitting and standing balance during functional tasks, and decreased safety and independence with ADLs and functional mobility/transfers. Pt currently demonstrates ability to complete UB ADLs Independent to Contact guard assist, LB ADLs with Contact guard to Mod assist, and functional mobility/transfers with a RW with Contact guard to Min assist. Pt will benefit from acute skilled OT services to address deficits outlined below and increase safety and independence with ADLs, functional transfers, and functional mobility. Post acute discharge, pt will benefit from intensive inpatient skilled rehab services < 3 hours per day to maximize rehab potential. Pending progress during this admission, pt my be able to progress to recommendation of home health OT post discharge.      If plan is discharge home, recommend the following: A little help with walking and/or transfers;A lot of help with bathing/dressing/bathroom;Assistance with cooking/housework;Assist  for transportation;Help with stairs or ramp for entrance    Functional Status Assessment  Patient has had a recent decline in their functional status and demonstrates the ability to make significant improvements in function in a reasonable and predictable amount of time.  Equipment Recommendations  Tub/shower seat    Recommendations for Other Services       Precautions / Restrictions Precautions Precautions: Fall;Other (comment) Precaution Comments: Pt with wound vac Restrictions Weight Bearing Restrictions: No      Mobility Bed Mobility Overal bed mobility: Needs Assistance Bed Mobility: Supine to Sit     Supine to sit: Contact guard, HOB elevated, Used rails     General bed mobility comments: with increased time and assistance managing wound vac tubing    Transfers Overall transfer level: Needs assistance Equipment used: Rolling walker (2 wheels) Transfers: Sit to/from Stand, Bed to chair/wheelchair/BSC Sit to Stand: Contact guard assist     Step pivot transfers: Contact guard assist, Min assist     General transfer comment: Pt largely contact guard for functional transfers requiring minimal occasional steady assist  while stepping to the side or turning      Balance Overall balance assessment: Needs assistance Sitting-balance support: Bilateral upper extremity supported, Single extremity supported, Feet supported Sitting balance-Leahy Scale: Fair   Postural control: Posterior lean (Able to self correct) Standing balance support: Single extremity supported, Bilateral upper extremity supported, During functional activity, Reliant on assistive device for balance Standing balance-Leahy Scale: Poor                             ADL either performed or assessed with clinical judgement   ADL Overall ADL's : Needs assistance/impaired Eating/Feeding: Independent;Sitting   Grooming: Contact guard assist;Standing  Upper Body Bathing: Contact guard  assist;Sitting   Lower Body Bathing: Moderate assistance;Sit to/from stand   Upper Body Dressing : Contact guard assist;Sitting (assistance with managing telemetry cords and IV lines)   Lower Body Dressing: Moderate assistance;Sit to/from stand   Toilet Transfer: Minimal assistance;Ambulation;Comfort height toilet;Grab bars;Rolling walker (2 wheels) Toilet Transfer Details (indicate cue type and reason): cues for technique as pt is new to use of RW Toileting- Clothing Manipulation and Hygiene: Contact guard assist;Sit to/from stand       Functional mobility during ADLs: Contact guard assist;Minimal assistance;Rolling walker (2 wheels) (Occasional cues for technique as pt isnot used to using RW) General ADL Comments: Pt with decreased activity tolerance and decreased balance during funcitonal tasks.     Vision Baseline Vision/History: 1 Wears glasses (Readers) Ability to See in Adequate Light: 0 Adequate (with glasses) Patient Visual Report: No change from baseline       Perception         Praxis         Pertinent Vitals/Pain Pain Assessment Pain Assessment: No/denies pain     Extremity/Trunk Assessment Upper Extremity Assessment Upper Extremity Assessment: Overall WFL for tasks assessed;Right hand dominant   Lower Extremity Assessment Lower Extremity Assessment: Defer to PT evaluation   Cervical / Trunk Assessment Cervical / Trunk Assessment: Normal   Communication Communication Communication: Hearing impairment (mildly HOH)   Cognition Arousal: Alert Behavior During Therapy: WFL for tasks assessed/performed Overall Cognitive Status: Within Functional Limits for tasks assessed                                 General Comments: Pt AAOx4, able to follow multi-step commands consistantly, and pleasant throughout session.     General Comments  VSS on RA throughout session. MD and NP present during a portion of session.    Exercises     Shoulder  Instructions      Home Living Family/patient expects to be discharged to:: Private residence Living Arrangements: Alone Available Help at Discharge: Other (Comment) (Limited; Pt reports his daughter can assist occasionally but lives about an hour away) Type of Home: House Home Access: Stairs to enter [employer] of Steps: 8 steps from garage Entrance Stairs-Rails: Right;Left Home Layout: Multi-level (3 levels) Alternate Level Stairs-Number of Steps: Split Level - 6 to bedroom Alternate Level Stairs-Rails: Right Bathroom Shower/Tub: Producer, television/film/video: Standard Bathroom Accessibility: Yes How Accessible: Accessible via walker Home Equipment: Rolling Walker (2 wheels)          Prior Functioning/Environment Prior Level of Function : Independent/Modified Independent;Driving             Mobility Comments: Independent wiht funcitonal mobility without an AD. Pt received a RW during last admission, but reports he has not used it. ADLs Comments: At baseline, pt is Independent with all ADLs and IADLs, drives, does yard work        OT Problem List: Decreased activity tolerance;Impaired balance (sitting and/or standing);Decreased knowledge of use of DME or AE      OT Treatment/Interventions: Self-care/ADL training;Energy conservation;DME and/or AE instruction;Therapeutic activities;Balance training;Canalith reposition    OT Goals(Current goals can be found in the care plan section) Acute Rehab OT Goals Patient Stated Goal: To get rid of infection and retun home OT Goal Formulation: With patient Time For Goal Achievement: 11/09/22 Potential to Achieve Goals: Good ADL Goals Pt Will Perform Grooming: with modified independence;standing Pt Will  Perform Lower Body Bathing: with supervision;sit to/from stand;sitting/lateral leans Pt Will Perform Lower Body Dressing: with supervision;sitting/lateral leans;sit to/from stand Pt Will Transfer to Toilet: with  modified independence;ambulating;regular height toilet;grab bars (with least restrictive AD) Pt Will Perform Toileting - Clothing Manipulation and hygiene: with modified independence;sit to/from stand;sitting/lateral leans Additional ADL Goal #1: Patient will demonstrate ability to Independently state 4 energy conservation techniques to increase safety and independence with funcitonal tasks in the home.  OT Frequency: Min 1X/week    Co-evaluation              AM-PAC OT "6 Clicks" Daily Activity     Outcome Measure Help from another person eating meals?: None Help from another person taking care of personal grooming?: A Little Help from another person toileting, which includes using toliet, bedpan, or urinal?: A Little Help from another person bathing (including washing, rinsing, drying)?: A Lot Help from another person to put on and taking off regular upper body clothing?: A Little Help from another person to put on and taking off regular lower body clothing?: A Lot 6 Click Score: 17   End of Session Equipment Utilized During Treatment: Gait belt;Rolling walker (2 wheels);Other (comment) (wound vac) Nurse Communication: Mobility status  Activity Tolerance: Patient tolerated treatment well Patient left: in chair;with call bell/phone within reach;Other (comment) (with NT in room)  OT Visit Diagnosis: Unsteadiness on feet (R26.81);Other (comment) (Decreased activity tolerance)                Time: 4098-1191 OT Time Calculation (min): 54 min Charges:  OT General Charges $OT Visit: 1 Visit OT Evaluation $OT Eval Moderate Complexity: 1 Mod OT Treatments $Self Care/Home Management : 23-37 mins  Ragen Laver "Orson Eva., OTR/L, MA Acute Rehab [phone]   Lendon Colonel 10/26/2022, 11:18 AM

## 2022-10-26 NOTE — TOC Progression Note (Signed)
Transition of Care [facility]) - Progression Note    Patient Details  Name: Edward Campos MRN: [id number] Date of Birth: [birth year]  [facility] CM/SW Contact  Lockie Pares, RN Phone Number: 10/26/2022, 3:02 PM  Clinical Narrative:    Interdisciplinary discussion with OT PT CSW Nursing and MD regarding discharge planning. Patient is ambulating with walker > 200 feet unlikely to meet for SNF. [facility] services favored. Patient would like to go home. He is active with [facility]. Messaged with Morrie Sheldon from [employer], and discussed services PT OT aide social work and Charity fundraiser for possible IV abx. Messaged with Jeri Modena from [facility] for possible home IV abx. Both will follow along with patient. TOC will continue to follow as well   Expected Discharge Plan: Home w Home Health Services Barriers to Discharge: Continued Medical Work up  Expected Discharge Plan and Services   Discharge Planning Services: CM Consult Post Acute Care Choice: Home Health, Resumption of Svcs/PTA Provider, Skilled Nursing Facility Living arrangements for the past 2 months: Single Family Home                           HH Arranged: PT, OT, Nurse's Aide, RN, Social Work [employer] Agency: [facility] Date HH Agency Contacted: 10/26/22 Time HH Agency Contacted: 1458 Representative spoke with at [facility] Agency: Morrie Sheldon   Social Determinants of Health (SDOH) Interventions SDOH Screenings   Food Insecurity: No Food Insecurity (10/25/2022)  Housing: Low Risk  (10/25/2022)  Transportation Needs: No Transportation Needs (10/25/2022)  Utilities: Not At Risk (10/25/2022)  Tobacco Use: Medium Risk (10/25/2022)    Readmission Risk Interventions     No data to display

## 2022-10-26 NOTE — Care Management Important Message (Signed)
Important Message  Patient Details  Name: Aliyan Grizzle MRN: [id number] Date of Birth: 1933/02/01   Medicare Important Message Given:  Yes     Sherilyn Banker 10/26/2022, 2:34 PM

## 2022-10-26 NOTE — Progress Notes (Signed)
PHARMACIST LIPID MONITORING   Edward Campos is a 89 y.o. male admitted on 10/22/2022 with Infected right to left femoral-femoral Dacron bypass graft .  Pharmacy has been consulted to optimize lipid-lowering therapy with the indication of secondary prevention for clinical ASCVD.  Recent Labs:  Lipid Panel (last 6 months):   Lab Results  Component Value Date   CHOL 58 10/26/2022   TRIG 68 10/26/2022   HDL 15 (L) 10/26/2022   CHOLHDL 3.9 10/26/2022   VLDL 14 10/26/2022   LDLCALC 29 10/26/2022    Hepatic function panel (last 6 months):   Lab Results  Component Value Date   AST 55 (H) 10/26/2022   ALT 87 (H) 10/26/2022   ALKPHOS 48 10/26/2022   BILITOT 1.0 10/26/2022    SCr (since admission):   Serum creatinine: 0.76 mg/dL 16/10/96 0454 Estimated creatinine clearance: 65.1 mL/min  Current therapy and lipid therapy tolerance Current lipid-lowering therapy: rosuvastatin 20mg  Previous lipid-lowering therapies (if applicable): n/a Documented or reported allergies or intolerances to lipid-lowering therapies (if applicable): n/a  Assessment:   Holding rosuvastatin due to transaminitis, which has down trended. No changes recommended.  Plan:    1.Statin intensity (high intensity recommended for all patients regardless of the LDL):  No statin changes. The patient is already on a high intensity statin.  2.Add ezetimibe (if any one of the following):   Not indicated at this time.  3.Refer to lipid clinic:   No  4.Follow-up with:  Primary care provider - Patient, No Pcp Per  5.Follow-up labs after discharge:  No changes in lipid therapy, repeat a lipid panel in one year.      Alphia Moh, PharmD, BCPS, BCCP Clinical Pharmacist  Please check AMION for all [facility] Pharmacy phone numbers After 10:00 PM, call Main Pharmacy [phone]

## 2022-10-26 NOTE — Progress Notes (Addendum)
      Subjective  - Doing well.  He did not rest well, but he did not need pain medication at all.   Objective 104/68 79 98.1 F (36.7 C) (Oral) 16 100%  Intake/Output Summary (Last 24 hours) at 10/26/2022 0700 Last data filed at 10/26/2022 0510 Gross per 24 hour  Intake 3403.25 ml  Output 1600 ml  Net 1803.25 ml    Doppler signals DP B brisk, motor intact B groins soft with good seal of incisional vacs.  No drainage in canister Lungs non labored breathing   Assessment/Planning: POD # 1  Procedure: 1.  Redo exposure of bilateral common femoral arteries greater than 30 days 2.  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  Redo right to left femoral-femoral bypass graft with cadaveric great saphenous vein 4.  Left common femoral to left SFA interposition bypass with cadaveric great saphenous vein 5.  Myriad morcells bilateral groin wounds 6.  Bilateral Prevena wound VAC placement  Vanc and Maxipime IV antibiotics are ordered      Component 1 d ago  Specimen Description FLUID  Special Requests infected bilateral femoral graft  Gram Stain ABUNDANT WBC PRESENT, PREDOMINANTLY PMN ABUNDANT GRAM POSITIVE COCCI IN PAIRS IN CLUSTERS Performed at [facility], [address], [place], [place] [address]  Culture PENDING  Report Status PENDING     Will follow cultures Mobility per PT/OT Maintain vacs Urine OP good foley d/c'd this am Pending am labs    Mosetta Pigeon 10/26/2022 7:00 AM --  Laboratory Lab Results: Recent Labs    10/25/22 1454 10/26/22 0120  WBC 13.4* 10.6*  HGB 9.7* 8.7*  HCT 28.1* 24.8*  PLT 215 204   BMET Recent Labs    10/25/22 0524 10/25/22 1001 10/25/22 1145 10/25/22 1454 10/26/22 0120  NA 127*   < > 128*  --  127*  K 4.3   < > 4.6  --  4.0  CL 96*  --   --   --  99  CO2 25  --   --   --  22  GLUCOSE 118*  --   --   --  131*  BUN 19  --   --   --  20  CREATININE 0.86  --   --  0.92 0.86   CALCIUM 8.1*  --   --   --  7.5*   < > = values in this interval not displayed.    COAG Lab Results  Component Value Date   INR 1.2 08/16/2022   INR 1.1 06/04/2019   No results found for: "PTT"   I have independently interviewed and examined patient and agree with PA assessment and plan above.  Follow-up lower cultures and tailor antibiotics.  Out of bed as tolerated.  Kierria Feigenbaum C. Randie Heinz, MD [facility] Office: [phone] Pager: [phone]

## 2022-10-26 NOTE — H&P (View-Only) (Signed)
 [facility]    Date of Admission:  10/22/2022     Total days of antibiotics 4   Vanc 8/13 >>   Cefepime 8/13 >>           Reason for Consult: vascular graft infection     Referring Provider: Janee Morn / [facility] Primary Care Provider: Patient, No Pcp Per   Assessment: Edward Campos is a 89 y.o. male admitted from [facility] office for surgical management of infected Fem-Fem BPG. S/P I&D in June of this year with empiric oral abx x 2 weeks and local wound care. Did OK until August where he developed worsened bloody-puurlent drainage and fevers with local cellulitis. Now S/P Redo with cadaver tissue grafts.   Vascular Graft Infection -  S/P removal 8/15 and replacement of cadavar bpg tissue -   -staphylococcus aureus growing on cultures. MRSA & MSSA PCR + from nares.  -Change to daptomycin monotherapy    Vascular Access -  -Likely will require at least a partial course of IV antibiotics. Will need to talk more about this with him. He lives alone at home.  -BCx collected 8/13 no growth preliminary.    SCLC -  -has not started treatment plan for this yet. Required PNA tx 2x this year for post-obstructive PNA.    Dr. Renold Don is on this weekend for ID coverage and will further adjust antibiotics based on susceptibilities.    Plan: Change abx to daptomycin to cover for MSSA/MRSA  Follow susceptibilities  Baseline CK    Principal Problem:   Infection of vascular bypass graft (HCC) Active Problems:   Peripheral vascular disease (HCC)   Small cell lung cancer, left lower lobe (HCC)   Sepsis (HCC)   Hypokalemia   Transaminitis   Hyponatremia   AAA (abdominal aortic aneurysm) (HCC)   PAD (peripheral artery disease) (HCC)    aspirin EC  81 mg Oral Daily   chlorhexidine  15 mL Mouth/Throat BID   Chlorhexidine Gluconate Cloth  6 each Topical Q0600   docusate sodium  100 mg Oral Daily   heparin  5,000 Units Subcutaneous Q8H   multivitamin with minerals   1 tablet Oral Daily   mupirocin ointment  1 Application Nasal BID   pantoprazole  40 mg Oral Daily   saccharomyces boulardii  250 mg Oral BID   sodium chloride flush  3 mL Intravenous Q12H    HPI: Edward Campos is a 89 y.o. male admitted directly from vascular office for medical/surgical management of vascular graft infection.   PMHx: AAA, BPH, anxiety, PVD, SCLC.  06/08/2019: Originally Dr. Darrick Penna performed Right common femoral external iliac endarterectomy, right common (9 x 39 VBX) and external iliac stent 8 x 59 VBX, 8 x 40 Inova, right to left femoral-femoral bypass, left external iliac common femoral endarterectomy, patch angioplasty right common femoral artery Dacron for Sever Claudication symptoms.     Recent Hx: 07/06/19 - required I&D of left groin with VAC - clear fluid / seroma formation. 08/16/22 - Washout of wound with placement of fish skin graft and vac to close wound. Sent out on oral doxycycline + cefpodoxime for 14d to treat post obstructive PNA and exposed/infected graft.  Followed closely with [facility]VS outpatient with good improvement with wound care and oral abx.  Incidentally found lung mass which was confirmed on biopsy SCLC.  Required another hospital stay for pneumonia 7/15 where he was discharged on cefdinir x 5d.  Early August  he messaged the [facility] team and mentioned that there was increased seeping and blood.  Seen in the office 8/12 - experiencing fevers and purulent drainage. Direct admitted for surgical management. 8/15 underwent excision of fem-fem bypass graft. Op note reviewed - old infected dacron grafts removed and redo R-L Fem-Fem bpg with cadaveric GSV, L common Femoral to L SFA with cadaveric bpg.   Op cultures growing staph aureus - wound vac in place.    Review of Systems: Review of Systems  Constitutional:  Positive for chills and fever. Negative for malaise/fatigue.  Cardiovascular:  Negative for leg swelling.  Gastrointestinal:  Negative for abdominal  pain, nausea and vomiting.    Past Medical History:  Diagnosis Date   AAA (abdominal aortic aneurysm) (HCC)    Anxiety    Arthritis    BPH (benign prostatic hyperplasia)    Dizziness    Hyperlipidemia    PVD (peripheral vascular disease) (HCC)    S/p fem-fem bypass grafting 08/2022   SCLC (small cell lung carcinoma), left (HCC)     Social History   Tobacco Use   Smoking status: Former    Current packs/day: 0.00    Average packs/day: 1.5 packs/day for 70.0 years (105.0 ttl pk-yrs)    Types: Cigarettes    Start date: 05/29/1947    Quit date: 05/30/2019    Years since quitting: 3.4   Smokeless tobacco: Never  Vaping Use   Vaping status: Never Used  Substance Use Topics   Alcohol use: Yes    Alcohol/week: 3.0 standard drinks of alcohol    Types: 3 Shots of liquor per week    Comment: 3 2oz. shots of bourbon/day   Drug use: No    Family History  Problem Relation Age of Onset   Stroke Mother    Pseudochol deficiency Neg Hx    Malignant hyperthermia Neg Hx    Hypotension Neg Hx    Anesthesia problems Neg Hx    Allergies  Allergen Reactions   Penicillins Other (See Comments)    OBJECTIVE: Blood pressure (!) 96/55, pulse 85, temperature (!) 97.5 F (36.4 C), temperature source Axillary, resp. rate 15, height 5\' 9"  (1.753 m), weight 75.9 kg, SpO2 97%.  Physical Exam Constitutional:      Appearance: Normal appearance.  Cardiovascular:     Rate and Rhythm: Normal rate.  Pulmonary:     Effort: Pulmonary effort is normal.  Abdominal:     General: Bowel sounds are normal.     Palpations: Abdomen is soft.  Skin:    General: Skin is warm and dry.     Comments: Left groin with clean vac dressing in place.   Neurological:     Mental Status: He is alert.     Lab Results Lab Results  Component Value Date   WBC 11.3 (H) 10/26/2022   HGB 9.1 (L) 10/26/2022   HCT 25.5 (L) 10/26/2022   MCV 85.0 10/26/2022   PLT 226 10/26/2022    Lab Results  Component Value Date    CREATININE 0.76 10/26/2022   BUN 17 10/26/2022   NA 128 (L) 10/26/2022   K 3.8 10/26/2022   CL 99 10/26/2022   CO2 22 10/26/2022    Lab Results  Component Value Date   ALT 87 (H) 10/26/2022   AST 55 (H) 10/26/2022   ALKPHOS 48 10/26/2022   BILITOT 1.0 10/26/2022     Microbiology: Recent Results (from the past 240 hour(s))  Surgical pcr screen  Status: Abnormal   Collection Time: 10/23/22  7:44 AM   Specimen: Nasal Mucosa; Nasal Swab  Result Value Ref Range Status   MRSA, PCR POSITIVE (A) NEGATIVE Final    Comment: RESULT CALLED TO, READ BACK BY AND VERIFIED WITH: RN MIA RINAL [id number] AT 1029 BY EC    Staphylococcus aureus POSITIVE (A) NEGATIVE Final    Comment: (NOTE) The Xpert SA Assay (FDA approved for NASAL specimens in patients 8 years of age and older), is one component of a comprehensive surveillance program. It is not intended to diagnose infection nor to guide or monitor treatment. Performed at [facility], [address] [place], [place] [address]   Culture, blood (Routine X 2) w Reflex to ID Panel     Status: None (Preliminary result)   Collection Time: 10/23/22 12:24 PM   Specimen: BLOOD  Result Value Ref Range Status   Specimen Description BLOOD RIGHT ANTECUBITAL  Final   Special Requests Blood Culture adequate volume  Final   Culture   Final    NO GROWTH 3 DAYS Performed at [facility], [address], [place], [place] [address]    Report Status PENDING  Incomplete  Culture, blood (Routine X 2) w Reflex to ID Panel     Status: None (Preliminary result)   Collection Time: 10/23/22 12:24 PM   Specimen: BLOOD  Result Value Ref Range Status   Specimen Description BLOOD BLOOD RIGHT FOREARM  Final   Special Requests   Final    BOTTLES DRAWN AEROBIC AND ANAEROBIC Blood Culture adequate volume   Culture   Final    NO GROWTH 3 DAYS Performed at [facility], [address] [place], [place] [address]    Report Status PENDING   Incomplete  Aerobic/Anaerobic Culture w Gram Stain (surgical/deep wound)     Status: None (Preliminary result)   Collection Time: 10/25/22 12:35 PM   Specimen: Path fluid; Tissue  Result Value Ref Range Status   Specimen Description FLUID  Final   Special Requests infected bilateral femoral graft  Final   Gram Stain   Final    ABUNDANT WBC PRESENT, PREDOMINANTLY PMN ABUNDANT GRAM POSITIVE COCCI IN PAIRS IN CLUSTERS    Culture   Final    MODERATE STAPHYLOCOCCUS AUREUS SUSCEPTIBILITIES TO FOLLOW Performed at [facility], [address] [place], [place] [address]    Report Status PENDING  Incomplete    Rexene Alberts, MSN, NP-C [facility] Pager: [phone]  10/26/2022 9:55 AM

## 2022-10-27 ENCOUNTER — Inpatient Hospital Stay (HOSPITAL_COMMUNITY): Payer: Medicare Other

## 2022-10-27 DIAGNOSIS — R7881 Bacteremia: Secondary | ICD-10-CM | POA: Diagnosis not present

## 2022-10-27 DIAGNOSIS — B9562 Methicillin resistant Staphylococcus aureus infection as the cause of diseases classified elsewhere: Secondary | ICD-10-CM

## 2022-10-27 DIAGNOSIS — A4102 Sepsis due to Methicillin resistant Staphylococcus aureus: Secondary | ICD-10-CM

## 2022-10-27 DIAGNOSIS — T827XXD Infection and inflammatory reaction due to other cardiac and vascular devices, implants and grafts, subsequent encounter: Secondary | ICD-10-CM | POA: Diagnosis not present

## 2022-10-27 DIAGNOSIS — I714 Abdominal aortic aneurysm, without rupture, unspecified: Secondary | ICD-10-CM | POA: Diagnosis not present

## 2022-10-27 DIAGNOSIS — I739 Peripheral vascular disease, unspecified: Secondary | ICD-10-CM | POA: Diagnosis not present

## 2022-10-27 DIAGNOSIS — E876 Hypokalemia: Secondary | ICD-10-CM | POA: Diagnosis not present

## 2022-10-27 HISTORY — DX: Bacteremia: R78.81

## 2022-10-27 HISTORY — DX: Sepsis due to methicillin resistant Staphylococcus aureus: A41.02

## 2022-10-27 HISTORY — DX: Methicillin resistant Staphylococcus aureus infection as the cause of diseases classified elsewhere: B95.62

## 2022-10-27 LAB — CBC
HCT: 26.3 % — ABNORMAL LOW (ref 39.0–52.0)
Hemoglobin: 9.3 g/dL — ABNORMAL LOW (ref 13.0–17.0)
MCH: 29.4 pg (ref 26.0–34.0)
MCHC: 35.4 g/dL (ref 30.0–36.0)
MCV: 83.2 fL (ref 80.0–100.0)
Platelets: 263 10*3/uL (ref 150–400)
RBC: 3.16 MIL/uL — ABNORMAL LOW (ref 4.22–5.81)
RDW: 16 % — ABNORMAL HIGH (ref 11.5–15.5)
WBC: 13.3 10*3/uL — ABNORMAL HIGH (ref 4.0–10.5)
nRBC: 0 % (ref 0.0–0.2)

## 2022-10-27 LAB — COMPREHENSIVE METABOLIC PANEL
ALT: 81 U/L — ABNORMAL HIGH (ref 0–44)
AST: 46 U/L — ABNORMAL HIGH (ref 15–41)
Albumin: 2.4 g/dL — ABNORMAL LOW (ref 3.5–5.0)
Alkaline Phosphatase: 68 U/L (ref 38–126)
Anion gap: 7 (ref 5–15)
BUN: 12 mg/dL (ref 8–23)
CO2: 24 mmol/L (ref 22–32)
Calcium: 7.6 mg/dL — ABNORMAL LOW (ref 8.9–10.3)
Chloride: 96 mmol/L — ABNORMAL LOW (ref 98–111)
Creatinine, Ser: 0.92 mg/dL (ref 0.61–1.24)
GFR, Estimated: >60 mL/min (ref >60)
Glucose, Bld: 119 mg/dL — ABNORMAL HIGH (ref 70–99)
Potassium: 3.6 mmol/L (ref 3.5–5.1)
Sodium: 127 mmol/L — ABNORMAL LOW (ref 135–145)
Total Bilirubin: 0.9 mg/dL (ref 0.3–1.2)
Total Protein: 5.4 g/dL — ABNORMAL LOW (ref 6.5–8.1)

## 2022-10-27 LAB — ECHOCARDIOGRAM COMPLETE
Area-P 1/2: 2.24 cm2
Calc EF: 70 %
Height: 69 in
MV M vel: 2.87 m/s
MV Peak grad: 32.9 mmHg
MV VTI: 2.61 cm2
S' Lateral: 3.1 cm
Single Plane A2C EF: 64.2 %
Single Plane A4C EF: 73.3 %
Weight: 2677.27 oz

## 2022-10-27 LAB — BLOOD CULTURE ID PANEL (REFLEXED) - BCID2
Proteus species: NOT DETECTED
Salmonella species: NOT DETECTED
Stenotrophomonas maltophilia: NOT DETECTED

## 2022-10-27 LAB — MAGNESIUM: Magnesium: 1.7 mg/dL (ref 1.7–2.4)

## 2022-10-27 LAB — CK: Total CK: 104 U/L (ref 49–397)

## 2022-10-27 LAB — PHOSPHORUS: Phosphorus: 1.9 mg/dL — ABNORMAL LOW (ref 2.5–4.6)

## 2022-10-27 MED ORDER — SORBITOL 70 % SOLN
30.0000 mL | Status: AC
  Administered 2022-10-27 (×2): 30 mL via ORAL
  Filled 2022-10-27 (×2): qty 30

## 2022-10-27 MED ORDER — POTASSIUM PHOSPHATES 15 MMOLE/5ML IV SOLN
30.0000 mmol | Freq: Once | INTRAVENOUS | Status: AC
  Administered 2022-10-27: 30 mmol via INTRAVENOUS
  Filled 2022-10-27: qty 10

## 2022-10-27 MED ORDER — MAGNESIUM SULFATE 2 GM/50ML IV SOLN
2.0000 g | Freq: Once | INTRAVENOUS | Status: AC
  Administered 2022-10-27: 2 g via INTRAVENOUS
  Filled 2022-10-27: qty 50

## 2022-10-27 NOTE — Progress Notes (Signed)
PHARMACY - PHYSICIAN COMMUNICATION CRITICAL VALUE ALERT - BLOOD CULTURE IDENTIFICATION (BCID)  Edward Campos is an 89 y.o. male who presented to [facility] on 10/22/2022 with a chief complaint of vascular graft infection.   Assessment: Bcx 1/4 growing GPC - BCID showing MRSA. ID following currently.   Name of physician (or Provider) Contacted: Dr Janalyn Shy  Current antibiotics: Daptomycin  Changes to prescribed antibiotics recommended:  No changes - current abx covering and ID following.  Results for orders placed or performed during the hospital encounter of 10/22/22  Blood Culture ID Panel (Reflexed) (Collected: 10/23/2022 12:24 PM)  Result Value Ref Range   Enterococcus faecalis NOT DETECTED NOT DETECTED   Enterococcus Faecium NOT DETECTED NOT DETECTED   Listeria monocytogenes NOT DETECTED NOT DETECTED   Staphylococcus species DETECTED (A) NOT DETECTED   Staphylococcus aureus (BCID) DETECTED (A) NOT DETECTED   Staphylococcus epidermidis NOT DETECTED NOT DETECTED   Staphylococcus lugdunensis NOT DETECTED NOT DETECTED   Streptococcus species NOT DETECTED NOT DETECTED   Streptococcus agalactiae NOT DETECTED NOT DETECTED   Streptococcus pneumoniae NOT DETECTED NOT DETECTED   Streptococcus pyogenes NOT DETECTED NOT DETECTED   A.calcoaceticus-baumannii NOT DETECTED NOT DETECTED   Bacteroides fragilis NOT DETECTED NOT DETECTED   Enterobacterales NOT DETECTED NOT DETECTED   Enterobacter cloacae complex NOT DETECTED NOT DETECTED   Escherichia coli NOT DETECTED NOT DETECTED   Klebsiella aerogenes NOT DETECTED NOT DETECTED   Klebsiella oxytoca NOT DETECTED NOT DETECTED   Klebsiella pneumoniae NOT DETECTED NOT DETECTED   Proteus species NOT DETECTED NOT DETECTED   Salmonella species NOT DETECTED NOT DETECTED   Serratia marcescens NOT DETECTED NOT DETECTED   Haemophilus influenzae NOT DETECTED NOT DETECTED   Neisseria meningitidis NOT DETECTED NOT DETECTED   Pseudomonas aeruginosa NOT  DETECTED NOT DETECTED   Stenotrophomonas maltophilia NOT DETECTED NOT DETECTED   Candida albicans NOT DETECTED NOT DETECTED   Candida auris NOT DETECTED NOT DETECTED   Candida glabrata NOT DETECTED NOT DETECTED   Candida krusei NOT DETECTED NOT DETECTED   Candida parapsilosis NOT DETECTED NOT DETECTED   Candida tropicalis NOT DETECTED NOT DETECTED   Cryptococcus neoformans/gattii NOT DETECTED NOT DETECTED   Meth resistant mecA/C and MREJ DETECTED (A) NOT DETECTED    Thank you for allowing pharmacy to participate in this patient's care,  Sherron Monday, PharmD, BCCCP Clinical Pharmacist  Phone: [phone] 10/27/2022 4:10 AM  Please check AMION for all [facility] Pharmacy phone numbers After 10:00 PM, call Main Pharmacy [phone]

## 2022-10-27 NOTE — Progress Notes (Signed)
  Echocardiogram 2D Echocardiogram has been performed.  Edward Campos 10/27/2022, 2:17 PM

## 2022-10-27 NOTE — Plan of Care (Signed)
°  Problem: Education: °Goal: Knowledge of General Education information will improve °Description: Including pain rating scale, medication(s)/side effects and non-pharmacologic comfort measures °Outcome: Progressing °  °Problem: Nutrition: °Goal: Adequate nutrition will be maintained °Outcome: Progressing °  °Problem: Skin Integrity: °Goal: Risk for impaired skin integrity will decrease °Outcome: Progressing °  °

## 2022-10-27 NOTE — Progress Notes (Signed)
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

## 2022-10-27 NOTE — Progress Notes (Addendum)
[facility]  Subjective  - sitting up eating breakfast   Objective 136/87 90 97.6 F (36.4 C) (Oral) 14 98%  Intake/Output Summary (Last 24 hours) at 10/27/2022 0744 Last data filed at 10/27/2022 0646 Gross per 24 hour  Intake 2244.58 ml  Output 2225 ml  Net 19.58 ml    Incisional vacs with good seal B Groins, 100 cc SS drainage in vac DP doppler signals Lungs non labored   Assessment/Planning: Procedure: 1.  Redo exposure of bilateral common femoral arteries greater than 30 days 2.  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  Redo right to left femoral-femoral bypass graft with cadaveric great saphenous vein 4.  Left common femoral to left SFA interposition bypass with cadaveric great saphenous vein 5.  Myriad morcells bilateral groin wounds 6.  Bilateral Prevena wound VAC placement  Blood cultures:Bcx 1/4 growing GPC - BCID showing MRSA. ID following currently.  Current antibiotics: Daptomycin  Mobility is good     Mosetta Pigeon 10/27/2022 7:44 AM --  Laboratory Lab Results: Recent Labs    10/26/22 0748 10/27/22 0527  WBC 11.3* 13.3*  HGB 9.1* 9.3*  HCT 25.5* 26.3*  PLT 226 263   BMET Recent Labs    10/26/22 0748 10/27/22 0527  NA 128* 127*  K 3.8 3.6  CL 99 96*  CO2 22 24  GLUCOSE 116* 119*  BUN 17 12  CREATININE 0.76 0.92  CALCIUM 7.7* 7.6*    COAG Lab Results  Component Value Date   INR 1.2 08/16/2022   INR 1.1 06/04/2019   No results found for: "PTT"  I have independently interviewed and examined patient and agree with PA assessment and plan above.   Antavion Bartoszek C. Randie Heinz, MD [facility] Office: [phone] Pager: [phone]

## 2022-10-27 NOTE — Progress Notes (Signed)
Air leak detected from the wound vac, RN called on call PA and suggested to reinforce the dressing. Wound vac reinforced with tegaderm.

## 2022-10-27 NOTE — Progress Notes (Signed)
PROGRESS NOTE    Edward Campos  WUJ:[id number] DOB: 1932/04/03 DOA: 10/22/2022 PCP: Patient, No Pcp Per   No chief complaint on file.   Brief Narrative:  Edward Campos is a 89 y.o. male with past medical history of HLD, PVD s/p bypass grafting, AAA, SCLC who presented as a direct admission to the vascular service for concern a infected femoral bypass.  Patient had been hospitalized in June for infected right to left femoral to femoral bypass graft with temporary wound VAC placement.  Over the last week he had noticed  increased swelling, pain, bloody/possible pus drainage, reported fevers up to 101 F at home.  Records note he was advised to go to the emergency department for further evaluation, but ended up followed up in the office on 8/12.  Orders were placed to admit the patient directly to the hospital for need of removal of the graft.  Patient was noted to be afebrile with pulse 84-109, and all other vital signs relatively maintained.  Labs from yesterday significant for WBC 13.2, hemoglobin 11.5, sodium 122, calcium, and ALT 46.  MRSA PCR screen was positive.  CT angiogram of the abdomen pelvis noted right bifemoral bypass graft with fluid/inflammatory changes associated with the femorofemoral bypass most prominent on the left compatible with a graft infection, and a 5.8 infrarenal abdominal aortic aneurysm grossly unchanged in size.  Patient had been started on empiric antibiotics of vancomycin and cefepime.  He had been posted for graft excision today.  However, patient declined having graft removed this morning as he wanted time to think about his options.  He is amendable to having the graft removed at this time.  Vascular surgery asked [facility] to assume care as patient due to other comorbidities.   Patient also reports that he is continue to have a intermittently productive cough since admitted in the hospital back in July with a postobstructive pneumonia.  Denies coughing up any blood.  He has  not been able to start chemotherapy due to his wounds not being completely healed.   Assessment & Plan:   Principal Problem:   Sepsis due to methicillin resistant Staphylococcus aureus (MRSA) (HCC) Active Problems:   Sepsis (HCC)   Infection of vascular bypass graft (HCC)   Peripheral vascular disease (HCC)   Small cell lung cancer, left lower lobe (HCC)   Hypokalemia   Transaminitis   Hyponatremia   AAA (abdominal aortic aneurysm) (HCC)   PAD (peripheral artery disease) (HCC)   MRSA bacteremia  #1 sepsis secondary to infected left femoral graft MRSA, MRSA bacteremia, POA -Patient met criteria for sepsis on admission with tachycardia, leukocytosis, fevers of 101 Fahrenheit prior to admission. -MRSA screening noted to be positive. -CT angiogram abdomen and pelvis noted right bifemoral bypass graft with fluid/inflammatory changes associated with femoral-femoral bypass most prominent on the left compatible with graft infection. -Blood cultures and lactic acid levels obtained and blood cultures positive for MRSA.  -Patient noted to have received IV Vanco and cefepime prior to blood cultures being drawn. -Patient seen by vascular surgery who initially had planned on removing graft, patient noted to have initially declined needing more time to think about it and patient subsequently agreed for graft removal.   -Patient underwent excision of infected right to left femorofemoral bypass graft with redo right to left femorofemoral bypass graft with cadaveric great saphenous vein, left common femoral to left SFA interposition bypass with cadaveric great saphenous vein, redo exposure of bilateral common femoral arteries greater than 30 days,  my read more cells bilateral groin wounds, bilateral Prevena wound VAC placement.(10/25/2022) -Significant purulence noted in the left groin and the common femoral artery in the left groin significantly scarred in place and friable per op note findings.  Cultures  sent and positive for MRSA.  With preliminary results with moderate staff aureus.   -Patient was on IV Vanco and IV cefepime.   -Patient seen in consultation by ID and antibiotics changed to IV daptomycin monotherapy.   -Due to positive blood cultures of MRSA we will check a 2D echo. -ID following and appreciate input and recommendations.   -Per vascular surgery.    2.  Acute on chronic hyponatremia -Baseline sodium 128-131. -On initial presentation sodium noted at 122 with repeat at 126. -Patient with history of small cell lung cancer. -Sodium currently at 127 this morning.  3.  Hypokalemia/hypophosphatemia -Repleted.   -Potassium at 3.6 today, magnesium at 1.7.   -Phosphorus at 1.9. -K-Phos 30 mmol IV x 1. -Magnesium sulfate 2 g IV x 1.  -Repeat labs in the AM.  4.  Small cell lung cancer with bone metastases -Patient noted stage IV lung cancer with disease to the bone and prior postobstructive pneumonia. -CT scan done with branching masslike opacity measuring 5.2 x 7.9 cm in the left hilar region/central left lower lobe similar in size to prior. -Chemotherapy noted not to have been initiated due to wounds not being healed yet. -Palliative care consulted for goals of care. -Outpatient follow-up with primary oncologist.  5.  PAD -Continue aspirin. -Statin discontinued due to transaminitis.   -Plavix held, vascular surgery to advise when Plavix may be resumed.  6.  Transaminitis -??  Etiology. -CT angiogram abdomen and pelvis with no acute abnormalities noted in the left. -LFTs were trending up but trending back down with hydration.. -Acute hepatitis panel negative.   -Crestor discontinued.   -Follow.  7.  Hyperlipidemia -Crestor on hold secondary to rising LFTs.    8.  AAA -Patient with 5.8 x 5.7 cm infrarenal abdominal aortic aneurysm unchanged in size. -Vascular surgery following.     DVT prophylaxis: Heparin Code Status: Full Family Communication: Updated  patient, no family at bedside.. Disposition: SNF versus home with home health.   Status is: Inpatient Remains inpatient appropriate because: Severity of illness   Consultants:  Palliative care: Lorinda Creed, NP 10/24/2022 [facility] hospitalist: Dr. Katrinka Blazing 10/23/2022 Vascular surgery ID:: Dr. Daiva Eves 10/26/2022  Procedures:  CT angiogram abdomen and pelvis 10/22/2022 1.  Redo exposure of bilateral common femoral arteries greater than 30 days 2.  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  Redo right to left femoral-femoral bypass graft with cadaveric great saphenous vein 4.  Left common femoral to left SFA interposition bypass with cadaveric great saphenous vein 5.  Myriad morcells bilateral groin wounds 6.  Bilateral Prevena wound VAC placement ----Per vascular surgery: Dr. Chestine Spore and Dr. Sherral Hammers 10/25/2022.  Antimicrobials: Anti-infectives (From admission, onward)    Start     Dose/Rate Route Frequency Ordered Stop   10/26/22 0945  DAPTOmycin (CUBICIN) 600 mg in sodium chloride 0.9 % IVPB        8 mg/kg  75.9 kg 124 mL/hr over 30 Minutes Intravenous Daily 10/26/22 0855     10/26/22 0945  cefTRIAXone (ROCEPHIN) 2 g in sodium chloride 0.9 % 100 mL IVPB  Status:  Discontinued        2 g 200 mL/hr over 30 Minutes Intravenous Every 24 hours 10/26/22 0855 10/26/22 0905  10/25/22 1006  ceFAZolin 1 g / gentamicin 80 mg in NS 500 mL surgical irrigation  Status:  Discontinued          As needed 10/25/22 1006 10/25/22 1318   10/23/22 1145  vancomycin (VANCOREADY) IVPB 1250 mg/250 mL  Status:  Discontinued        1,250 mg 166.7 mL/hr over 90 Minutes Intravenous Every 24 hours 10/23/22 1051 10/26/22 0855   10/23/22 1100  ceFEPIme (MAXIPIME) 2 g in sodium chloride 0.9 % 100 mL IVPB  Status:  Discontinued        2 g 200 mL/hr over 30 Minutes Intravenous Every 12 hours 10/23/22 1041 10/26/22 0855         Subjective: Laying in bed about to order lunch.  Denies any chest  pain or shortness of breath.  No abdominal pain.  Overall feels well.   Objective: Vitals:   10/27/22 0354 10/27/22 0745 10/27/22 1100 10/27/22 1508  BP: 136/87 (!) 133/103 (!) 155/67 136/82  Pulse: 90 89 84 87  Resp: 14 20 19 18   Temp: 97.6 F (36.4 C) 97.6 F (36.4 C) 98.8 F (37.1 C) (!) 97.5 F (36.4 C)  TempSrc: Oral Axillary Oral Oral  SpO2: 98% 100% 99% 98%  Weight:      Height:        Intake/Output Summary (Last 24 hours) at 10/27/2022 1659 Last data filed at 10/27/2022 1657 Gross per 24 hour  Intake 1747.06 ml  Output 2925 ml  Net -1177.94 ml   Filed Weights   10/23/22 0431 10/25/22 0645 10/25/22 0651  Weight: 75.9 kg 75.9 kg 75.9 kg    Examination:  General exam: NAD Respiratory system: CTAB.  No wheezes, no crackles, no rhonchi.  Fair air movement.  Speaking in full sentences.  Cardiovascular system: Regular rate rhythm no murmurs rubs or gallops.  No JVD.  No lower extremity edema.  Gastrointestinal system: Abdomen is soft, nontender, nondistended, positive bowel sounds.  Bilateral wound vacs in lower abdominal region.  Central nervous system: Alert and oriented. No focal neurological deficits. Extremities: Symmetric 5 x 5 power. Skin: No rashes, lesions or ulcers Psychiatry: Judgement and insight appear normal. Mood & affect appropriate.     Data Reviewed: I have personally reviewed following labs and imaging studies  CBC: Recent Labs  Lab 10/24/22 0940 10/25/22 0524 10/25/22 1001 10/25/22 1145 10/25/22 1454 10/26/22 0120 10/26/22 0748 10/27/22 0527  WBC 14.5* 10.6*  --   --  13.4* 10.6* 11.3* 13.3*  NEUTROABS 12.0* 8.7*  --   --   --  8.9*  --   --   HGB 12.6* 11.1*   < > 9.5* 9.7* 8.7* 9.1* 9.3*  HCT 35.3* 31.5*   < > 28.0* 28.1* 24.8* 25.5* 26.3*  MCV 83.1 82.7  --   --  84.1 85.5 85.0 83.2  PLT 278 254  --   --  215 204 226 263   < > = values in this interval not displayed.    Basic Metabolic Panel: Recent Labs  Lab 10/24/22 0940  10/25/22 0524 10/25/22 1001 10/25/22 1056 10/25/22 1145 10/25/22 1454 10/26/22 0120 10/26/22 0748 10/27/22 0527  NA 126* 127*   < > 128* 128*  --  127* 128* 127*  K 3.2* 4.3   < > 4.7 4.6  --  4.0 3.8 3.6  CL 92* 96*  --   --   --   --  99 99 96*  CO2 23 25  --   --   --   --  22 22 24   GLUCOSE 129* 118*  --   --   --   --  131* 116* 119*  BUN 20 19  --   --   --   --  20 17 12   CREATININE 0.99 0.86  --   --   --  0.92 0.86 0.76 0.92  CALCIUM 8.4* 8.1*  --   --   --   --  7.5* 7.7* 7.6*  MG 1.9 1.8  --   --   --   --  1.7  --  1.7  PHOS  --   --   --   --   --   --   --   --  1.9*   < > = values in this interval not displayed.    GFR: Estimated Creatinine Clearance: 56.6 mL/min (by C-G formula based on SCr of 0.92 mg/dL).  Liver Function Tests: Recent Labs  Lab 10/23/22 1224 10/24/22 0940 10/25/22 0524 10/26/22 0120 10/27/22 0527  AST 57* 85* 110* 55* 46*  ALT 55* 89* 133* 87* 81*  ALKPHOS 78 81 82 48 68  BILITOT 1.3* 0.9 0.9 1.0 0.9  PROT 5.8* 6.0* 5.4* 4.6* 5.4*  ALBUMIN 2.6* 2.6* 2.2* 2.2* 2.4*    CBG: No results for input(s): "GLUCAP" in the last 168 hours.   Recent Results (from the past 240 hour(s))  Surgical pcr screen     Status: Abnormal   Collection Time: 10/23/22  7:44 AM   Specimen: Nasal Mucosa; Nasal Swab  Result Value Ref Range Status   MRSA, PCR POSITIVE (A) NEGATIVE Final    Comment: RESULT CALLED TO, READ BACK BY AND VERIFIED WITH: RN MIA RINAL [phone] AT 1029 BY EC    Staphylococcus aureus POSITIVE (A) NEGATIVE Final    Comment: (NOTE) The Xpert SA Assay (FDA approved for NASAL specimens in patients 23 years of age and older), is one component of a comprehensive surveillance program. It is not intended to diagnose infection nor to guide or monitor treatment. Performed at [facility], [address] [place], [place] [address]   Culture, blood (Routine X 2) w Reflex to ID Panel     Status: None (Preliminary result)   Collection  Time: 10/23/22 12:24 PM   Specimen: BLOOD  Result Value Ref Range Status   Specimen Description BLOOD RIGHT ANTECUBITAL  Final   Special Requests Blood Culture adequate volume  Final   Culture  Setup Time   Final    GRAM POSITIVE COCCI ANAEROBIC BOTTLE ONLY CRITICAL RESULT CALLED TO, READ BACK BY AND VERIFIED WITH: K HURTH,PHARMD@[id number]  10/27/22 MK Performed at [facility], [address], [place], [place] [address]    Culture GRAM POSITIVE COCCI  Final   Report Status PENDING  Incomplete  Culture, blood (Routine X 2) w Reflex to ID Panel     Status: None (Preliminary result)   Collection Time: 10/23/22 12:24 PM   Specimen: BLOOD  Result Value Ref Range Status   Specimen Description BLOOD BLOOD RIGHT FOREARM  Final   Special Requests   Final    BOTTLES DRAWN AEROBIC AND ANAEROBIC Blood Culture adequate volume   Culture   Final    NO GROWTH 4 DAYS Performed at [facility], [address], [place], [place] [address]    Report Status PENDING  Incomplete  Blood Culture ID Panel (Reflexed)     Status: Abnormal   Collection Time: 10/23/22 12:24 PM  Result  Value Ref Range Status   Enterococcus faecalis NOT DETECTED NOT DETECTED Final   Enterococcus Faecium NOT DETECTED NOT DETECTED Final   Listeria monocytogenes NOT DETECTED NOT DETECTED Final   Staphylococcus species DETECTED (A) NOT DETECTED Final    Comment: CRITICAL RESULT CALLED TO, READ BACK BY AND VERIFIED WITH: K HURTH,PHARMD@[id number]  10/27/22 MK    Staphylococcus aureus (BCID) DETECTED (A) NOT DETECTED Final    Comment: Methicillin (oxacillin)-resistant Staphylococcus aureus (MRSA). MRSA is predictably resistant to beta-lactam antibiotics (except ceftaroline). Preferred therapy is vancomycin unless clinically contraindicated. Patient requires contact precautions if  hospitalized. CRITICAL RESULT CALLED TO, READ BACK BY AND VERIFIED WITH: K HURTH,PHARMD@[id number]  10/27/22 MK    Staphylococcus epidermidis NOT DETECTED NOT  DETECTED Final   Staphylococcus lugdunensis NOT DETECTED NOT DETECTED Final   Streptococcus species NOT DETECTED NOT DETECTED Final   Streptococcus agalactiae NOT DETECTED NOT DETECTED Final   Streptococcus pneumoniae NOT DETECTED NOT DETECTED Final   Streptococcus pyogenes NOT DETECTED NOT DETECTED Final   A.calcoaceticus-baumannii NOT DETECTED NOT DETECTED Final   Bacteroides fragilis NOT DETECTED NOT DETECTED Final   Enterobacterales NOT DETECTED NOT DETECTED Final   Enterobacter cloacae complex NOT DETECTED NOT DETECTED Final   Escherichia coli NOT DETECTED NOT DETECTED Final   Klebsiella aerogenes NOT DETECTED NOT DETECTED Final   Klebsiella oxytoca NOT DETECTED NOT DETECTED Final   Klebsiella pneumoniae NOT DETECTED NOT DETECTED Final   Proteus species NOT DETECTED NOT DETECTED Final   Salmonella species NOT DETECTED NOT DETECTED Final   Serratia marcescens NOT DETECTED NOT DETECTED Final   Haemophilus influenzae NOT DETECTED NOT DETECTED Final   Neisseria meningitidis NOT DETECTED NOT DETECTED Final   Pseudomonas aeruginosa NOT DETECTED NOT DETECTED Final   Stenotrophomonas maltophilia NOT DETECTED NOT DETECTED Final   Candida albicans NOT DETECTED NOT DETECTED Final   Candida auris NOT DETECTED NOT DETECTED Final   Candida glabrata NOT DETECTED NOT DETECTED Final   Candida krusei NOT DETECTED NOT DETECTED Final   Candida parapsilosis NOT DETECTED NOT DETECTED Final   Candida tropicalis NOT DETECTED NOT DETECTED Final   Cryptococcus neoformans/gattii NOT DETECTED NOT DETECTED Final   Meth resistant mecA/C and MREJ DETECTED (A) NOT DETECTED Final    Comment: CRITICAL RESULT CALLED TO, READ BACK BY AND VERIFIED WITH: K HURTH,PHARMD@0354  10/27/22 MK Performed at [facility], [address], [place], [place] [address]   Aerobic/Anaerobic Culture w Gram Stain (surgical/deep wound)     Status: None (Preliminary result)   Collection Time: 10/25/22 12:35 PM   Specimen: Path  fluid; Tissue  Result Value Ref Range Status   Specimen Description FLUID  Final   Special Requests infected bilateral femoral graft  Final   Gram Stain   Final    ABUNDANT WBC PRESENT, PREDOMINANTLY PMN ABUNDANT GRAM POSITIVE COCCI IN PAIRS IN CLUSTERS Performed at [facility], [address], [place], [place] [address]    Culture   Final    MODERATE STAPHYLOCOCCUS AUREUS NO ANAEROBES ISOLATED; CULTURE IN PROGRESS FOR 5 DAYS    Report Status PENDING  Incomplete   Organism ID, Bacteria STAPHYLOCOCCUS AUREUS  Final      Susceptibility   Staphylococcus aureus - MIC*    CIPROFLOXACIN >=8 RESISTANT Resistant     ERYTHROMYCIN <=0.25 SENSITIVE Sensitive     GENTAMICIN <=0.5 SENSITIVE Sensitive     OXACILLIN >=4 RESISTANT Resistant     TETRACYCLINE <=1 SENSITIVE Sensitive     VANCOMYCIN <=0.5 SENSITIVE Sensitive  TRIMETH/SULFA <=10 SENSITIVE Sensitive     CLINDAMYCIN <=0.25 SENSITIVE Sensitive     RIFAMPIN <=0.5 SENSITIVE Sensitive     Inducible Clindamycin NEGATIVE Sensitive     LINEZOLID 2 SENSITIVE Sensitive     * MODERATE STAPHYLOCOCCUS AUREUS         Radiology Studies: ECHOCARDIOGRAM COMPLETE  Result Date: 10/27/2022    ECHOCARDIOGRAM REPORT   Patient Name:   OCTAVION LEAP Date of Exam: 10/27/2022 Medical Rec #:  [id number]      Height:       69.0 in Accession #:    [id number]     Weight:       167.3 lb Date of Birth:  Sep 18, 1932       BSA:          1.915 m Patient Age:    89 years       BP:           155/67 mmHg Patient Gender: M              HR:           81 bpm. Exam Location:  Inpatient Procedure: 2D Echo, Cardiac Doppler and Color Doppler Indications:    Bacteremia  History:        Patient has prior history of Echocardiogram examinations, most                 recent 11/10/2020. PVD; Risk Factors:Dyslipidemia. SCLC, AAA.  Sonographer:    Milda Smart Referring Phys: [id number] Chrishawn Kring V Decklan Mau  Sonographer Comments: Image acquisition challenging due to patient body habitus  and Image acquisition challenging due to respiratory motion. IMPRESSIONS  1. Left ventricular ejection fraction, by estimation, is 60 to 65%. The left ventricle has normal function. The left ventricle has no regional wall motion abnormalities. There is moderate concentric left ventricular hypertrophy. Left ventricular diastolic parameters are consistent with Grade I diastolic dysfunction (impaired relaxation). Elevated left atrial pressure.  2. Right ventricular systolic function is normal. The right ventricular size is normal.  3. Left atrial size was mildly dilated.  4. The mitral valve is normal in structure. Mild mitral valve regurgitation. No evidence of mitral stenosis. Moderate mitral annular calcification.  5. The aortic valve is tricuspid. Aortic valve regurgitation is not visualized. No aortic stenosis is present.  6. Aortic dilatation noted. There is borderline dilatation of the aortic root, measuring 39 mm.  7. The inferior vena cava is normal in size with greater than 50% respiratory variability, suggesting right atrial pressure of 3 mmHg. FINDINGS  Left Ventricle: Left ventricular ejection fraction, by estimation, is 60 to 65%. The left ventricle has normal function. The left ventricle has no regional wall motion abnormalities. The left ventricular internal cavity size was normal in size. There is  moderate concentric left ventricular hypertrophy. Left ventricular diastolic parameters are consistent with Grade I diastolic dysfunction (impaired relaxation). Elevated left atrial pressure. Right Ventricle: The right ventricular size is normal. Right ventricular systolic function is normal. Left Atrium: Left atrial size was mildly dilated. Right Atrium: Right atrial size was normal in size. Pericardium: There is no evidence of pericardial effusion. Mitral Valve: The mitral valve is normal in structure. Moderate mitral annular calcification. Mild mitral valve regurgitation. No evidence of mitral valve  stenosis. MV peak gradient, 8.2 mmHg. The mean mitral valve gradient is 4.0 mmHg. Tricuspid Valve: The tricuspid valve is normal in structure. Tricuspid valve regurgitation is mild . No evidence of tricuspid stenosis. Aortic Valve: The aortic  valve is tricuspid. Aortic valve regurgitation is not visualized. No aortic stenosis is present. Pulmonic Valve: The pulmonic valve was grossly normal. Pulmonic valve regurgitation is trivial. No evidence of pulmonic stenosis. Aorta: Aortic dilatation noted. There is borderline dilatation of the aortic root, measuring 39 mm. Venous: The inferior vena cava is normal in size with greater than 50% respiratory variability, suggesting right atrial pressure of 3 mmHg. IAS/Shunts: No atrial level shunt detected by color flow Doppler.  LEFT VENTRICLE PLAX 2D LVIDd:         3.70 cm     Diastology LVIDs:         3.10 cm     LV e' medial:    5.44 cm/s LV PW:         1.50 cm     LV E/e' medial:  19.7 LV IVS:        1.40 cm     LV e' lateral:   6.09 cm/s LVOT diam:     2.50 cm     LV E/e' lateral: 17.6 LV SV:         116 LV SV Index:   60 LVOT Area:     4.91 cm  LV Volumes (MOD) LV vol d, MOD A2C: 58.7 ml LV vol d, MOD A4C: 76.9 ml LV vol s, MOD A2C: 21.0 ml LV vol s, MOD A4C: 20.5 ml LV SV MOD A2C:     37.7 ml LV SV MOD A4C:     76.9 ml LV SV MOD BP:      48.8 ml RIGHT VENTRICLE             IVC RV S prime:     20.40 cm/s  IVC diam: 1.60 cm TAPSE (M-mode): 3.7 cm LEFT ATRIUM             Index        RIGHT ATRIUM           Index LA diam:        3.90 cm 2.04 cm/m   RA Area:     16.30 cm LA Vol (A2C):   68.2 ml 35.61 ml/m  RA Volume:   41.00 ml  21.41 ml/m LA Vol (A4C):   61.3 ml 32.01 ml/m LA Biplane Vol: 64.7 ml 33.78 ml/m  AORTIC VALVE LVOT Vmax:   105.15 cm/s LVOT Vmean:  75.500 cm/s LVOT VTI:    0.236 m  AORTA Ao Root diam: 3.60 cm Ao Asc diam:  3.90 cm MITRAL VALVE                TRICUSPID VALVE MV Area (PHT): 2.24 cm     TR Peak grad:   21.3 mmHg MV Area VTI:   2.61 cm     TR  Vmax:        231.00 cm/s MV Peak grad:  8.2 mmHg MV Mean grad:  4.0 mmHg     SHUNTS MV Vmax:       1.43 m/s     Systemic VTI:  0.24 m MV Vmean:      101.0 cm/s   Systemic Diam: 2.50 cm MV Decel Time: 338 msec MR Peak grad: 32.9 mmHg MR Vmax:      287.00 cm/s MV E velocity: 107.00 cm/s MV A velocity: 129.00 cm/s MV E/A ratio:  0.83 Olga Millers MD Electronically signed by Olga Millers MD Signature Date/Time: 10/27/2022/2:38:00 PM    Final         Scheduled Meds:  aspirin EC  81 mg Oral Daily   chlorhexidine  15 mL Mouth/Throat BID   Chlorhexidine Gluconate Cloth  6 each Topical Q0600   docusate sodium  100 mg Oral Daily   heparin  5,000 Units Subcutaneous Q8H   multivitamin with minerals  1 tablet Oral Daily   mupirocin ointment  1 Application Nasal BID   pantoprazole  40 mg Oral Daily   sodium chloride flush  3 mL Intravenous Q12H   sorbitol  30 mL Oral Q3H   Continuous Infusions:  sodium chloride     sodium chloride     sodium chloride 100 mL/hr at 10/27/22 1100   DAPTOmycin (CUBICIN) 600 mg in sodium chloride 0.9 % IVPB 600 mg (10/27/22 1442)   lactated ringers     potassium PHOSPHATE IVPB (in mmol) 30 mmol (10/27/22 1105)     LOS: 5 days    Time spent: 40 minutes    Ramiro Harvest, MD [facility]   To contact the attending provider between 7A-7P or the covering provider during after hours 7P-7A, please log into the web site [email or web address] and access using universal [place] password for that web site. If you do not have the password, please call the hospital operator.  10/27/2022, 4:59 PM

## 2022-10-28 DIAGNOSIS — I714 Abdominal aortic aneurysm, without rupture, unspecified: Secondary | ICD-10-CM | POA: Diagnosis not present

## 2022-10-28 DIAGNOSIS — Z515 Encounter for palliative care: Secondary | ICD-10-CM | POA: Diagnosis not present

## 2022-10-28 DIAGNOSIS — T827XXD Infection and inflammatory reaction due to other cardiac and vascular devices, implants and grafts, subsequent encounter: Secondary | ICD-10-CM | POA: Diagnosis not present

## 2022-10-28 DIAGNOSIS — I739 Peripheral vascular disease, unspecified: Secondary | ICD-10-CM | POA: Diagnosis not present

## 2022-10-28 DIAGNOSIS — E876 Hypokalemia: Secondary | ICD-10-CM | POA: Diagnosis not present

## 2022-10-28 LAB — COMPREHENSIVE METABOLIC PANEL WITH GFR
ALT: 64 U/L — ABNORMAL HIGH (ref 0–44)
AST: 37 U/L (ref 15–41)
Albumin: 2.3 g/dL — ABNORMAL LOW (ref 3.5–5.0)
Alkaline Phosphatase: 73 U/L (ref 38–126)
Anion gap: 11 (ref 5–15)
BUN: 10 mg/dL (ref 8–23)
CO2: 21 mmol/L — ABNORMAL LOW (ref 22–32)
Calcium: 7.5 mg/dL — ABNORMAL LOW (ref 8.9–10.3)
Chloride: 94 mmol/L — ABNORMAL LOW (ref 98–111)
Creatinine, Ser: 0.74 mg/dL (ref 0.61–1.24)
GFR, Estimated: >60 mL/min
Glucose, Bld: 123 mg/dL — ABNORMAL HIGH (ref 70–99)
Potassium: 3.3 mmol/L — ABNORMAL LOW (ref 3.5–5.1)
Sodium: 126 mmol/L — ABNORMAL LOW (ref 135–145)
Total Bilirubin: 1.2 mg/dL (ref 0.3–1.2)
Total Protein: 5.3 g/dL — ABNORMAL LOW (ref 6.5–8.1)

## 2022-10-28 LAB — CBC
HCT: 26 % — ABNORMAL LOW (ref 39.0–52.0)
Hemoglobin: 9.3 g/dL — ABNORMAL LOW (ref 13.0–17.0)
MCH: 30.4 pg (ref 26.0–34.0)
MCHC: 35.8 g/dL (ref 30.0–36.0)
MCV: 85 fL (ref 80.0–100.0)
Platelets: 319 K/uL (ref 150–400)
RBC: 3.06 MIL/uL — ABNORMAL LOW (ref 4.22–5.81)
RDW: 16 % — ABNORMAL HIGH (ref 11.5–15.5)
WBC: 15.9 K/uL — ABNORMAL HIGH (ref 4.0–10.5)
nRBC: 0 % (ref 0.0–0.2)

## 2022-10-28 LAB — MAGNESIUM: Magnesium: 1.9 mg/dL (ref 1.7–2.4)

## 2022-10-28 LAB — PHOSPHORUS: Phosphorus: 2.4 mg/dL — ABNORMAL LOW (ref 2.5–4.6)

## 2022-10-28 MED ORDER — POTASSIUM CHLORIDE CRYS ER 20 MEQ PO TBCR
40.0000 meq | EXTENDED_RELEASE_TABLET | Freq: Once | ORAL | Status: AC
  Administered 2022-10-28: 40 meq via ORAL
  Filled 2022-10-28: qty 2

## 2022-10-28 MED ORDER — POTASSIUM PHOSPHATES 15 MMOLE/5ML IV SOLN
30.0000 mmol | Freq: Once | INTRAVENOUS | Status: AC
  Administered 2022-10-28: 30 mmol via INTRAVENOUS
  Filled 2022-10-28: qty 10

## 2022-10-28 NOTE — Progress Notes (Addendum)
[facility]  Subjective  - No new complaints as far as surgery   Objective 119/74 97 97.7 F (36.5 C) (Oral) 20 98%  Intake/Output Summary (Last 24 hours) at 10/28/2022 0749 Last data filed at 10/28/2022 0700 Gross per 24 hour  Intake 427.06 ml  Output 1375 ml  Net -947.94 ml    Vacs to good seal.  Re enforced vacs x 2 last 24 hours Doppler signals maintained DP B, motor intact Bloody drainage OP total 275 cc since surgery Heart RRR Lungs non labored breathing  Assessment/Planning: Procedure: 1.  Redo exposure of bilateral common femoral arteries greater than 30 days 2.  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  Redo right to left femoral-femoral bypass graft with cadaveric great saphenous vein 4.  Left common femoral to left SFA interposition bypass with cadaveric great saphenous vein 5.  Myriad morcells bilateral groin wounds 6.  Bilateral Prevena wound VAC placement   Blood cultures:Bcx 1/4 growing GPC - BCID showing MRSA. ID following currently.  Current antibiotics: Daptomycin  Mobility is good  Leukocytosis elevated 15.9, he is afebrile.  Repeat blood cultures have been sent. I asked him to protect the vacs when moving so we can maintain a good seal. HGB stable 9.3 without change  Mosetta Pigeon 10/28/2022 7:49 AM --  Laboratory Lab Results: Recent Labs    10/27/22 0527 10/28/22 0230  WBC 13.3* 15.9*  HGB 9.3* 9.3*  HCT 26.3* 26.0*  PLT 263 319   BMET Recent Labs    10/27/22 0527 10/28/22 0230  NA 127* 126*  K 3.6 3.3*  CL 96* 94*  CO2 24 21*  GLUCOSE 119* 123*  BUN 12 10  CREATININE 0.92 0.74  CALCIUM 7.6* 7.5*    COAG Lab Results  Component Value Date   INR 1.2 08/16/2022   INR 1.1 06/04/2019   No results found for: "PTT"  I have independently interviewed and examined patient and agree with PA assessment and plan above.   Edward Merritts C. Randie Heinz, MD [facility] Office: [phone] Pager: [phone]

## 2022-10-28 NOTE — Progress Notes (Signed)
   Palliative Medicine Inpatient Follow Up Note  HPI: 89 y.o. male   admitted on 10/22/2022 with history of a right to left femorofemoral bypass with common and external iliac artery stenting on the right with a known abdominal aortic aneurysm.  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 Palliative care support given patient acute on chronic disease burden.   Today's Discussion 10/28/2022  *Please note that this is a verbal dictation therefore any spelling or grammatical errors are due to the "[employer]" system interpretation.  Chart reviewed inclusive of vital signs, progress notes, laboratory results, and diagnostic images. Has required no PRN pain medications in > 48hrs. Has required no antiemetics.   I met with [name] RN, [place] this morning who shares that Tallen has shown poor safety awareness and multiple times tried to mobilize on his own.  I met with Phillp at bedside this morning. He is sitting on the edge of his bed in pleasant spirits. He shares that he feels well for the most part. He is anxious to get moving more and feels the staff do not check in on him often enough. Provide time to allow Rachit to voice his concerns.  Antonios is not clear on the plan from here per his vocalizations. He denies pain, nausea, SOB this morning.   No family was present at bedside during assessment.   Questions and concerns addressed/Palliative Support Provided.   Objective Assessment: Vital Signs Vitals:   10/28/22 0923 10/28/22 1137  BP: 92/73 95/80  Pulse: 91 96  Resp:  (!) 23  Temp: 98 F (36.7 C) 97.8 F (36.6 C)  SpO2: 98% 96%    Intake/Output Summary (Last 24 hours) at 10/28/2022 1203 Last data filed at 10/28/2022 0700 Gross per 24 hour  Intake 427.06 ml  Output 1375 ml  Net -947.94 ml   Last Weight  Most recent update: 10/25/2022  6:53 AM    Weight  75.9 kg (167 lb 5.3 oz)            Gen:  Elderly Caucasian M in NAD HEENT: moist  mucous membranes CV: Regular rate and rhythm  PULM: On RA, breathing is even and nonlabored ABD: soft/nontender  EXT: No edema  Neuro: Alert and oriented x3   SUMMARY OF RECOMMENDATIONS   Full Code/ Full Scope of Care -> Hard Choices booklet and MOST provided for review  Patient feels improved post procedure   Goals are for ongoing improvement/recovery   Ongoing PMT support  Billing based on MDM: Low ______________________________________________________________________________________ Lamarr Lulas [place] Palliative Medicine Team Team Cell Phone: [phone] Please utilize secure chat with additional questions, if there is no response within 30 minutes please call the above phone number  Palliative Medicine Team providers are available by phone from 7am to 7pm daily and can be reached through the team cell phone.  Should this patient require assistance outside of these hours, please call the patient's attending physician.

## 2022-10-28 NOTE — Plan of Care (Signed)

## 2022-10-28 NOTE — Progress Notes (Signed)
PROGRESS NOTE    Edward Campos  UUV:[id number] DOB: 1932/09/16 DOA: 10/22/2022 PCP: Patient, No Pcp Per   No chief complaint on file.   Brief Narrative:  Edward Campos is a 89 y.o. male with past medical history of HLD, PVD s/p bypass grafting, AAA, SCLC who presented as a direct admission to the vascular service for concern a infected femoral bypass.  Patient had been hospitalized in June for infected right to left femoral to femoral bypass graft with temporary wound VAC placement.  Over the last week he had noticed  increased swelling, pain, bloody/possible pus drainage, reported fevers up to 101 F at home.  Records note he was advised to go to the emergency department for further evaluation, but ended up followed up in the office on 8/12.  Orders were placed to admit the patient directly to the hospital for need of removal of the graft.  Patient was noted to be afebrile with pulse 84-109, and all other vital signs relatively maintained.  Labs from yesterday significant for WBC 13.2, hemoglobin 11.5, sodium 122, calcium, and ALT 46.  MRSA PCR screen was positive.  CT angiogram of the abdomen pelvis noted right bifemoral bypass graft with fluid/inflammatory changes associated with the femorofemoral bypass most prominent on the left compatible with a graft infection, and a 5.8 infrarenal abdominal aortic aneurysm grossly unchanged in size.  Patient had been started on empiric antibiotics of vancomycin and cefepime.  He had been posted for graft excision today.  However, patient declined having graft removed this morning as he wanted time to think about his options.  He is amendable to having the graft removed at this time.  Vascular surgery asked [facility] to assume care as patient due to other comorbidities.   Patient also reports that he is continue to have a intermittently productive cough since admitted in the hospital back in July with a postobstructive pneumonia.  Denies coughing up any blood.  He has  not been able to start chemotherapy due to his wounds not being completely healed.   Assessment & Plan:   Principal Problem:   Sepsis due to methicillin resistant Staphylococcus aureus (MRSA) (HCC) Active Problems:   Sepsis (HCC)   Infection of vascular bypass graft (HCC)   Peripheral vascular disease (HCC)   Small cell lung cancer, left lower lobe (HCC)   Hypokalemia   Transaminitis   Hyponatremia   AAA (abdominal aortic aneurysm) (HCC)   PAD (peripheral artery disease) (HCC)   MRSA bacteremia  #1 sepsis secondary to infected left femoral graft MRSA, MRSA bacteremia, POA -Patient met criteria for sepsis on admission with tachycardia, leukocytosis, fevers of 101 Fahrenheit prior to admission. -MRSA screening noted to be positive. -CT angiogram abdomen and pelvis noted right bifemoral bypass graft with fluid/inflammatory changes associated with femoral-femoral bypass most prominent on the left compatible with graft infection. -Blood cultures and lactic acid levels obtained and blood cultures positive for MRSA.  -Patient noted to have received IV Vanco and cefepime prior to blood cultures being drawn. -Patient seen by vascular surgery who initially had planned on removing graft, patient noted to have initially declined needing more time to think about it and patient subsequently agreed for graft removal.   -Patient underwent excision of infected right to left femorofemoral bypass graft with redo right to left femorofemoral bypass graft with cadaveric great saphenous vein, left common femoral to left SFA interposition bypass with cadaveric great saphenous vein, redo exposure of bilateral common femoral arteries greater than 30 days,  my read more cells bilateral groin wounds, bilateral Prevena wound VAC placement.(10/25/2022) -Significant purulence noted in the left groin and the common femoral artery in the left groin significantly scarred in place and friable per op note findings.  Cultures  sent and positive for MRSA.    -Patient was on IV Vanco and IV cefepime.   -Patient seen in consultation by ID and antibiotics changed to IV daptomycin monotherapy.   -Due to positive blood cultures of MRSA, 2D echo was obtained with normal EF, NWMA, diastolic dysfunction, no vegetations noted. -ID following and appreciate input and recommendations.   -Per vascular surgery.    2.  Acute on chronic hyponatremia -Baseline sodium 128-131. -On initial presentation sodium noted at 122 with repeat at 126. -Patient with history of small cell lung cancer. -Sodium currently at 126 this morning. -Saline lock IV fluids.  3.  Hypokalemia/hypophosphatemia -Potassium at 3.3, phosphorus at 2.4.  Magnesium at 1.9.   -K-Phos 30 mmol IV x 1 -K-Dur 40 mEq p.o. x 1. -Repeat labs in the AM.  4.  Small cell lung cancer with bone metastases -Patient noted stage IV lung cancer with disease to the bone and prior postobstructive pneumonia. -CT scan done with branching masslike opacity measuring 5.2 x 7.9 cm in the left hilar region/central left lower lobe similar in size to prior. -Chemotherapy noted not to have been initiated due to wounds not being healed yet. -Palliative care consulted for goals of care. -Outpatient follow-up with primary oncologist.  5.  PAD -Aspirin.   -Statin held due to transaminitis.  -Plavix held, vascular surgery to advise when Plavix may be resumed.  6.  Transaminitis -??  Etiology. -CT angiogram abdomen and pelvis with no acute abnormalities noted in the left. -LFTs were trending up but trending back down with hydration.. -Acute hepatitis panel negative.   -Crestor discontinued.   -Saline lock IV fluids. -Follow.  7.  Hyperlipidemia -Crestor held secondary to rising LFTs.   -LFTs trending down.   -Likely resume Crestor on discharge.  8.  AAA -Patient with 5.8 x 5.7 cm infrarenal abdominal aortic aneurysm unchanged in size. -Vascular surgery following.     DVT  prophylaxis: Heparin Code Status: Full Family Communication: Updated patient, no family at bedside.. Disposition: SNF once medically stable and cleared by vascular surgery and ID.Marland Kitchen   Status is: Inpatient Remains inpatient appropriate because: Severity of illness   Consultants:  Palliative care: Lorinda Creed, NP 10/24/2022 [facility] hospitalist: Dr. Katrinka Blazing 10/23/2022 Vascular surgery ID:: Dr. Daiva Eves 10/26/2022  Procedures:  CT angiogram abdomen and pelvis 10/22/2022 1.  Redo exposure of bilateral common femoral arteries greater than 30 days 2.  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  Redo right to left femoral-femoral bypass graft with cadaveric great saphenous vein 4.  Left common femoral to left SFA interposition bypass with cadaveric great saphenous vein 5.  Myriad morcells bilateral groin wounds 6.  Bilateral Prevena wound VAC placement ----Per vascular surgery: Dr. Chestine Spore and Dr. Sherral Hammers 10/25/2022.  2D echo 10/27/2022  Antimicrobials: Anti-infectives (From admission, onward)    Start     Dose/Rate Route Frequency Ordered Stop   10/26/22 0945  DAPTOmycin (CUBICIN) 600 mg in sodium chloride 0.9 % IVPB        8 mg/kg  75.9 kg 124 mL/hr over 30 Minutes Intravenous Daily 10/26/22 0855     10/26/22 0945  cefTRIAXone (ROCEPHIN) 2 g in sodium chloride 0.9 % 100 mL IVPB  Status:  Discontinued  2 g 200 mL/hr over 30 Minutes Intravenous Every 24 hours 10/26/22 0855 10/26/22 0905   10/25/22 1006  ceFAZolin 1 g / gentamicin 80 mg in NS 500 mL surgical irrigation  Status:  Discontinued          As needed 10/25/22 1006 10/25/22 1318   10/23/22 1145  vancomycin (VANCOREADY) IVPB 1250 mg/250 mL  Status:  Discontinued        1,250 mg 166.7 mL/hr over 90 Minutes Intravenous Every 24 hours 10/23/22 1051 10/26/22 0855   10/23/22 1100  ceFEPIme (MAXIPIME) 2 g in sodium chloride 0.9 % 100 mL IVPB  Status:  Discontinued        2 g 200 mL/hr over 30 Minutes Intravenous  Every 12 hours 10/23/22 1041 10/26/22 0855         Subjective: Laying in bed.  Overall feels well.  Denies any chest pain or shortness of breath.  No abdominal pain.  Tolerating oral intake.  Would like bed alarm to be discontinued.  Objective: Vitals:   10/28/22 0656 10/28/22 0801 10/28/22 0923 10/28/22 1137  BP: 119/74  92/73 95/80  Pulse: 97  91 96  Resp: 20 16  (!) 23  Temp: 97.7 F (36.5 C) 98.6 F (37 C) 98 F (36.7 C) 97.8 F (36.6 C)  TempSrc: Oral Oral Oral Oral  SpO2: 98% 100% 98% 96%  Weight:      Height:        Intake/Output Summary (Last 24 hours) at 10/28/2022 1157 Last data filed at 10/28/2022 0700 Gross per 24 hour  Intake 427.06 ml  Output 1375 ml  Net -947.94 ml   Filed Weights   10/23/22 0431 10/25/22 0645 10/25/22 0651  Weight: 75.9 kg 75.9 kg 75.9 kg    Examination:  General exam: NAD. Respiratory system: Lungs clear to auscultation bilaterally.  No wheezes, no crackles, no rhonchi.  Fair air movement.  Speaking in full sentences. Cardiovascular system: RRR no murmurs rubs or gallops.  No JVD.  No pitting lower extremity edema.  Gastrointestinal system: Abdomen is soft, nontender, nondistended, positive bowel sounds.  Bilateral wound vacs in lower abdominal region/groin.  Central nervous system: Alert and oriented. No focal neurological deficits. Extremities: Symmetric 5 x 5 power. Skin: No rashes, lesions or ulcers Psychiatry: Judgement and insight appear normal. Mood & affect appropriate.     Data Reviewed: I have personally reviewed following labs and imaging studies  CBC: Recent Labs  Lab 10/24/22 0940 10/25/22 0524 10/25/22 1001 10/25/22 1454 10/26/22 0120 10/26/22 0748 10/27/22 0527 10/28/22 0230  WBC 14.5* 10.6*  --  13.4* 10.6* 11.3* 13.3* 15.9*  NEUTROABS 12.0* 8.7*  --   --  8.9*  --   --   --   HGB 12.6* 11.1*   < > 9.7* 8.7* 9.1* 9.3* 9.3*  HCT 35.3* 31.5*   < > 28.1* 24.8* 25.5* 26.3* 26.0*  MCV 83.1 82.7  --  84.1  85.5 85.0 83.2 85.0  PLT 278 254  --  215 204 226 263 319   < > = values in this interval not displayed.    Basic Metabolic Panel: Recent Labs  Lab 10/24/22 0940 10/25/22 0524 10/25/22 1001 10/25/22 1145 10/25/22 1454 10/26/22 0120 10/26/22 0748 10/27/22 0527 10/28/22 0230  NA 126* 127*   < > 128*  --  127* 128* 127* 126*  K 3.2* 4.3   < > 4.6  --  4.0 3.8 3.6 3.3*  CL 92* 96*  --   --   --  99 99 96* 94*  CO2 23 25  --   --   --  22 22 24  21*  GLUCOSE 129* 118*  --   --   --  131* 116* 119* 123*  BUN 20 19  --   --   --  20 17 12 10   CREATININE 0.99 0.86  --   --  0.92 0.86 0.76 0.92 0.74  CALCIUM 8.4* 8.1*  --   --   --  7.5* 7.7* 7.6* 7.5*  MG 1.9 1.8  --   --   --  1.7  --  1.7 1.9  PHOS  --   --   --   --   --   --   --  1.9* 2.4*   < > = values in this interval not displayed.    GFR: Estimated Creatinine Clearance: 65.1 mL/min (by C-G formula based on SCr of 0.74 mg/dL).  Liver Function Tests: Recent Labs  Lab 10/24/22 0940 10/25/22 0524 10/26/22 0120 10/27/22 0527 10/28/22 0230  AST 85* 110* 55* 46* 37  ALT 89* 133* 87* 81* 64*  ALKPHOS 81 82 48 68 73  BILITOT 0.9 0.9 1.0 0.9 1.2  PROT 6.0* 5.4* 4.6* 5.4* 5.3*  ALBUMIN 2.6* 2.2* 2.2* 2.4* 2.3*    CBG: No results for input(s): "GLUCAP" in the last 168 hours.   Recent Results (from the past 240 hour(s))  Surgical pcr screen     Status: Abnormal   Collection Time: 10/23/22  7:44 AM   Specimen: Nasal Mucosa; Nasal Swab  Result Value Ref Range Status   MRSA, PCR POSITIVE (A) NEGATIVE Final    Comment: RESULT CALLED TO, READ BACK BY AND VERIFIED WITH: RN MIA RINAL [phone] AT 1029 BY EC    Staphylococcus aureus POSITIVE (A) NEGATIVE Final    Comment: (NOTE) The Xpert SA Assay (FDA approved for NASAL specimens in patients 74 years of age and older), is one component of a comprehensive surveillance program. It is not intended to diagnose infection nor to guide or monitor treatment. Performed at [facility], [address], [place], [place] [address]   Culture, blood (Routine X 2) w Reflex to ID Panel     Status: Abnormal (Preliminary result)   Collection Time: 10/23/22 12:24 PM   Specimen: BLOOD  Result Value Ref Range Status   Specimen Description BLOOD RIGHT ANTECUBITAL  Final   Special Requests Blood Culture adequate volume  Final   Culture  Setup Time   Final    GRAM POSITIVE COCCI ANAEROBIC BOTTLE ONLY CRITICAL RESULT CALLED TO, READ BACK BY AND VERIFIED WITH: K HURTH,PHARMD@[id number]  10/27/22 MK    Culture (A)  Final    STAPHYLOCOCCUS AUREUS SUSCEPTIBILITIES TO FOLLOW Performed at [facility], [address], [place], [place] [address]    Report Status PENDING  Incomplete  Culture, blood (Routine X 2) w Reflex to ID Panel     Status: None   Collection Time: 10/23/22 12:24 PM   Specimen: BLOOD  Result Value Ref Range Status   Specimen Description BLOOD BLOOD RIGHT FOREARM  Final   Special Requests   Final    BOTTLES DRAWN AEROBIC AND ANAEROBIC Blood Culture adequate volume   Culture   Final    NO GROWTH 5 DAYS Performed at [facility], [address] [place], [place] [address]    Report Status 10/28/2022 FINAL  Final  Blood Culture ID Panel (Reflexed)  Status: Abnormal   Collection Time: 10/23/22 12:24 PM  Result Value Ref Range Status   Enterococcus faecalis NOT DETECTED NOT DETECTED Final   Enterococcus Faecium NOT DETECTED NOT DETECTED Final   Listeria monocytogenes NOT DETECTED NOT DETECTED Final   Staphylococcus species DETECTED (A) NOT DETECTED Final    Comment: CRITICAL RESULT CALLED TO, READ BACK BY AND VERIFIED WITH: K HURTH,PHARMD@[id number]  10/27/22 MK    Staphylococcus aureus (BCID) DETECTED (A) NOT DETECTED Final    Comment: Methicillin (oxacillin)-resistant Staphylococcus aureus (MRSA). MRSA is predictably resistant to beta-lactam antibiotics (except ceftaroline). Preferred therapy is vancomycin unless clinically contraindicated. Patient  requires contact precautions if  hospitalized. CRITICAL RESULT CALLED TO, READ BACK BY AND VERIFIED WITH: K HURTH,PHARMD@[email or web address]  10/27/22 MK    Staphylococcus epidermidis NOT DETECTED NOT DETECTED Final   Staphylococcus lugdunensis NOT DETECTED NOT DETECTED Final   Streptococcus species NOT DETECTED NOT DETECTED Final   Streptococcus agalactiae NOT DETECTED NOT DETECTED Final   Streptococcus pneumoniae NOT DETECTED NOT DETECTED Final   Streptococcus pyogenes NOT DETECTED NOT DETECTED Final   A.calcoaceticus-baumannii NOT DETECTED NOT DETECTED Final   Bacteroides fragilis NOT DETECTED NOT DETECTED Final   Enterobacterales NOT DETECTED NOT DETECTED Final   Enterobacter cloacae complex NOT DETECTED NOT DETECTED Final   Escherichia coli NOT DETECTED NOT DETECTED Final   Klebsiella aerogenes NOT DETECTED NOT DETECTED Final   Klebsiella oxytoca NOT DETECTED NOT DETECTED Final   Klebsiella pneumoniae NOT DETECTED NOT DETECTED Final   Proteus species NOT DETECTED NOT DETECTED Final   Salmonella species NOT DETECTED NOT DETECTED Final   Serratia marcescens NOT DETECTED NOT DETECTED Final   Haemophilus influenzae NOT DETECTED NOT DETECTED Final   Neisseria meningitidis NOT DETECTED NOT DETECTED Final   Pseudomonas aeruginosa NOT DETECTED NOT DETECTED Final   Stenotrophomonas maltophilia NOT DETECTED NOT DETECTED Final   Candida albicans NOT DETECTED NOT DETECTED Final   Candida auris NOT DETECTED NOT DETECTED Final   Candida glabrata NOT DETECTED NOT DETECTED Final   Candida krusei NOT DETECTED NOT DETECTED Final   Candida parapsilosis NOT DETECTED NOT DETECTED Final   Candida tropicalis NOT DETECTED NOT DETECTED Final   Cryptococcus neoformans/gattii NOT DETECTED NOT DETECTED Final   Meth resistant mecA/C and MREJ DETECTED (A) NOT DETECTED Final    Comment: CRITICAL RESULT CALLED TO, READ BACK BY AND VERIFIED WITH: K HURTH,PHARMD@[id number]  10/27/22 MK Performed at [facility], [address] [place], [place] [address]   Aerobic/Anaerobic Culture w Gram Stain (surgical/deep wound)     Status: None (Preliminary result)   Collection Time: 10/25/22 12:35 PM   Specimen: Path fluid; Tissue  Result Value Ref Range Status   Specimen Description FLUID  Final   Special Requests infected bilateral femoral graft  Final   Gram Stain   Final    ABUNDANT WBC PRESENT, PREDOMINANTLY PMN ABUNDANT GRAM POSITIVE COCCI IN PAIRS IN CLUSTERS Performed at [facility], [address] [place], [place] [address]    Culture   Final    MODERATE STAPHYLOCOCCUS AUREUS NO ANAEROBES ISOLATED; CULTURE IN PROGRESS FOR 5 DAYS    Report Status PENDING  Incomplete   Organism ID, Bacteria STAPHYLOCOCCUS AUREUS  Final      Susceptibility   Staphylococcus aureus - MIC*    CIPROFLOXACIN >=8 RESISTANT Resistant     ERYTHROMYCIN <=0.25 SENSITIVE Sensitive     GENTAMICIN <=0.5 SENSITIVE Sensitive     OXACILLIN >=4 RESISTANT Resistant     TETRACYCLINE <=1  SENSITIVE Sensitive     VANCOMYCIN <=0.5 SENSITIVE Sensitive     TRIMETH/SULFA <=10 SENSITIVE Sensitive     CLINDAMYCIN <=0.25 SENSITIVE Sensitive     RIFAMPIN <=0.5 SENSITIVE Sensitive     Inducible Clindamycin NEGATIVE Sensitive     LINEZOLID 2 SENSITIVE Sensitive     * MODERATE STAPHYLOCOCCUS AUREUS  Culture, blood (Routine X 2) w Reflex to ID Panel     Status: None (Preliminary result)   Collection Time: 10/27/22  2:46 PM   Specimen: BLOOD RIGHT HAND  Result Value Ref Range Status   Specimen Description BLOOD RIGHT HAND  Final   Special Requests   Final    BOTTLES DRAWN AEROBIC AND ANAEROBIC Blood Culture adequate volume   Culture   Final    NO GROWTH < 24 HOURS Performed at [facility], [address] [place], [place] [address]    Report Status PENDING  Incomplete  Culture, blood (Routine X 2) w Reflex to ID Panel     Status: None (Preliminary result)   Collection Time: 10/27/22  2:47 PM   Specimen: BLOOD RIGHT ARM  Result  Value Ref Range Status   Specimen Description BLOOD RIGHT ARM  Final   Special Requests   Final    BOTTLES DRAWN AEROBIC AND ANAEROBIC Blood Culture adequate volume   Culture   Final    NO GROWTH < 24 HOURS Performed at [facility], [address], [place], [place] [address]    Report Status PENDING  Incomplete         Radiology Studies: ECHOCARDIOGRAM COMPLETE  Result Date: 10/27/2022    ECHOCARDIOGRAM REPORT   Patient Name:   JENNINGS MAKRIS Date of Exam: 10/27/2022 Medical Rec #:  [id number]      Height:       69.0 in Accession #:    [id number]     Weight:       167.3 lb Date of Birth:  [birth year]       BSA:          1.915 m Patient Age:    89 years       BP:           155/67 mmHg Patient Gender: M              HR:           81 bpm. Exam Location:  Inpatient Procedure: 2D Echo, Cardiac Doppler and Color Doppler Indications:    Bacteremia  History:        Patient has prior history of Echocardiogram examinations, most                 recent 11/10/2020. PVD; Risk Factors:Dyslipidemia. SCLC, AAA.  Sonographer:    Milda Smart Referring Phys: [id number] Thersa Mohiuddin V Jakeem Grape  Sonographer Comments: Image acquisition challenging due to patient body habitus and Image acquisition challenging due to respiratory motion. IMPRESSIONS  1. Left ventricular ejection fraction, by estimation, is 60 to 65%. The left ventricle has normal function. The left ventricle has no regional wall motion abnormalities. There is moderate concentric left ventricular hypertrophy. Left ventricular diastolic parameters are consistent with Grade I diastolic dysfunction (impaired relaxation). Elevated left atrial pressure.  2. Right ventricular systolic function is normal. The right ventricular size is normal.  3. Left atrial size was mildly dilated.  4. The mitral valve is normal in structure. Mild mitral valve regurgitation. No evidence of mitral stenosis. Moderate mitral annular calcification.  5. The  aortic valve is tricuspid. Aortic  valve regurgitation is not visualized. No aortic stenosis is present.  6. Aortic dilatation noted. There is borderline dilatation of the aortic root, measuring 39 mm.  7. The inferior vena cava is normal in size with greater than 50% respiratory variability, suggesting right atrial pressure of 3 mmHg. FINDINGS  Left Ventricle: Left ventricular ejection fraction, by estimation, is 60 to 65%. The left ventricle has normal function. The left ventricle has no regional wall motion abnormalities. The left ventricular internal cavity size was normal in size. There is  moderate concentric left ventricular hypertrophy. Left ventricular diastolic parameters are consistent with Grade I diastolic dysfunction (impaired relaxation). Elevated left atrial pressure. Right Ventricle: The right ventricular size is normal. Right ventricular systolic function is normal. Left Atrium: Left atrial size was mildly dilated. Right Atrium: Right atrial size was normal in size. Pericardium: There is no evidence of pericardial effusion. Mitral Valve: The mitral valve is normal in structure. Moderate mitral annular calcification. Mild mitral valve regurgitation. No evidence of mitral valve stenosis. MV peak gradient, 8.2 mmHg. The mean mitral valve gradient is 4.0 mmHg. Tricuspid Valve: The tricuspid valve is normal in structure. Tricuspid valve regurgitation is mild . No evidence of tricuspid stenosis. Aortic Valve: The aortic valve is tricuspid. Aortic valve regurgitation is not visualized. No aortic stenosis is present. Pulmonic Valve: The pulmonic valve was grossly normal. Pulmonic valve regurgitation is trivial. No evidence of pulmonic stenosis. Aorta: Aortic dilatation noted. There is borderline dilatation of the aortic root, measuring 39 mm. Venous: The inferior vena cava is normal in size with greater than 50% respiratory variability, suggesting right atrial pressure of 3 mmHg. IAS/Shunts: No atrial level shunt detected by color flow  Doppler.  LEFT VENTRICLE PLAX 2D LVIDd:         3.70 cm     Diastology LVIDs:         3.10 cm     LV e' medial:    5.44 cm/s LV PW:         1.50 cm     LV E/e' medial:  19.7 LV IVS:        1.40 cm     LV e' lateral:   6.09 cm/s LVOT diam:     2.50 cm     LV E/e' lateral: 17.6 LV SV:         116 LV SV Index:   60 LVOT Area:     4.91 cm  LV Volumes (MOD) LV vol d, MOD A2C: 58.7 ml LV vol d, MOD A4C: 76.9 ml LV vol s, MOD A2C: 21.0 ml LV vol s, MOD A4C: 20.5 ml LV SV MOD A2C:     37.7 ml LV SV MOD A4C:     76.9 ml LV SV MOD BP:      48.8 ml RIGHT VENTRICLE             IVC RV S prime:     20.40 cm/s  IVC diam: 1.60 cm TAPSE (M-mode): 3.7 cm LEFT ATRIUM             Index        RIGHT ATRIUM           Index LA diam:        3.90 cm 2.04 cm/m   RA Area:     16.30 cm LA Vol (A2C):   68.2 ml 35.61 ml/m  RA Volume:   41.00 ml  21.41 ml/m LA  Vol (A4C):   61.3 ml 32.01 ml/m LA Biplane Vol: 64.7 ml 33.78 ml/m  AORTIC VALVE LVOT Vmax:   105.15 cm/s LVOT Vmean:  75.500 cm/s LVOT VTI:    0.236 m  AORTA Ao Root diam: 3.60 cm Ao Asc diam:  3.90 cm MITRAL VALVE                TRICUSPID VALVE MV Area (PHT): 2.24 cm     TR Peak grad:   21.3 mmHg MV Area VTI:   2.61 cm     TR Vmax:        231.00 cm/s MV Peak grad:  8.2 mmHg MV Mean grad:  4.0 mmHg     SHUNTS MV Vmax:       1.43 m/s     Systemic VTI:  0.24 m MV Vmean:      101.0 cm/s   Systemic Diam: 2.50 cm MV Decel Time: 338 msec MR Peak grad: 32.9 mmHg MR Vmax:      287.00 cm/s MV E velocity: 107.00 cm/s MV A velocity: 129.00 cm/s MV E/A ratio:  0.83 Olga Millers MD Electronically signed by Olga Millers MD Signature Date/Time: 10/27/2022/2:38:00 PM    Final         Scheduled Meds:  aspirin EC  81 mg Oral Daily   chlorhexidine  15 mL Mouth/Throat BID   docusate sodium  100 mg Oral Daily   heparin  5,000 Units Subcutaneous Q8H   multivitamin with minerals  1 tablet Oral Daily   pantoprazole  40 mg Oral Daily   sodium chloride flush  3 mL Intravenous Q12H    Continuous Infusions:  sodium chloride     sodium chloride     DAPTOmycin (CUBICIN) 600 mg in sodium chloride 0.9 % IVPB 600 mg (10/27/22 1442)   lactated ringers     potassium PHOSPHATE IVPB (in mmol) 30 mmol (10/28/22 1147)     LOS: 6 days    Time spent: 40 minutes    Ramiro Harvest, MD [facility]   To contact the attending provider between 7A-7P or the covering provider during after hours 7P-7A, please log into the web site [email or web address] and access using universal [place] password for that web site. If you do not have the password, please call the hospital operator.  10/28/2022, 11:57 AM

## 2022-10-29 ENCOUNTER — Inpatient Hospital Stay (HOSPITAL_COMMUNITY): Payer: Medicare Other

## 2022-10-29 DIAGNOSIS — Z7189 Other specified counseling: Secondary | ICD-10-CM | POA: Diagnosis not present

## 2022-10-29 DIAGNOSIS — Z515 Encounter for palliative care: Secondary | ICD-10-CM | POA: Diagnosis not present

## 2022-10-29 DIAGNOSIS — I739 Peripheral vascular disease, unspecified: Secondary | ICD-10-CM | POA: Diagnosis not present

## 2022-10-29 DIAGNOSIS — I714 Abdominal aortic aneurysm, without rupture, unspecified: Secondary | ICD-10-CM | POA: Diagnosis not present

## 2022-10-29 DIAGNOSIS — T827XXD Infection and inflammatory reaction due to other cardiac and vascular devices, implants and grafts, subsequent encounter: Secondary | ICD-10-CM | POA: Diagnosis not present

## 2022-10-29 DIAGNOSIS — E876 Hypokalemia: Secondary | ICD-10-CM | POA: Diagnosis not present

## 2022-10-29 LAB — COMPREHENSIVE METABOLIC PANEL
ALT: 53 U/L — ABNORMAL HIGH (ref 0–44)
AST: 32 U/L (ref 15–41)
Albumin: 2.1 g/dL — ABNORMAL LOW (ref 3.5–5.0)
Alkaline Phosphatase: 65 U/L (ref 38–126)
Anion gap: 7 (ref 5–15)
BUN: 12 mg/dL (ref 8–23)
CO2: 24 mmol/L (ref 22–32)
Calcium: 7.6 mg/dL — ABNORMAL LOW (ref 8.9–10.3)
Chloride: 94 mmol/L — ABNORMAL LOW (ref 98–111)
Creatinine, Ser: 0.82 mg/dL (ref 0.61–1.24)
GFR, Estimated: >60 mL/min (ref >60)
Glucose, Bld: 103 mg/dL — ABNORMAL HIGH (ref 70–99)
Potassium: 4 mmol/L (ref 3.5–5.1)
Sodium: 125 mmol/L — ABNORMAL LOW (ref 135–145)
Total Bilirubin: 1.1 mg/dL (ref 0.3–1.2)
Total Protein: 5.1 g/dL — ABNORMAL LOW (ref 6.5–8.1)

## 2022-10-29 LAB — CBC
HCT: 24.5 % — ABNORMAL LOW (ref 39.0–52.0)
Hemoglobin: 8.3 g/dL — ABNORMAL LOW (ref 13.0–17.0)
MCH: 29.6 pg (ref 26.0–34.0)
MCHC: 33.9 g/dL (ref 30.0–36.0)
MCV: 87.5 fL (ref 80.0–100.0)
Platelets: 359 10*3/uL (ref 150–400)
RBC: 2.8 MIL/uL — ABNORMAL LOW (ref 4.22–5.81)
RDW: 16.6 % — ABNORMAL HIGH (ref 11.5–15.5)
WBC: 12.6 10*3/uL — ABNORMAL HIGH (ref 4.0–10.5)
nRBC: 0 % (ref 0.0–0.2)

## 2022-10-29 LAB — CBC WITH DIFFERENTIAL/PLATELET
Abs Immature Granulocytes: 0 10*3/uL (ref 0.00–0.07)
Basophils Absolute: 0 10*3/uL (ref 0.0–0.1)
Basophils Relative: 0 %
Eosinophils Absolute: 0 10*3/uL (ref 0.0–0.5)
Eosinophils Relative: 0 %
HCT: 24.1 % — ABNORMAL LOW (ref 39.0–52.0)
Hemoglobin: 8.5 g/dL — ABNORMAL LOW (ref 13.0–17.0)
Lymphocytes Relative: 3 %
Lymphs Abs: 0.4 10*3/uL — ABNORMAL LOW (ref 0.7–4.0)
MCH: 30.1 pg (ref 26.0–34.0)
MCHC: 35.3 g/dL (ref 30.0–36.0)
MCV: 85.5 fL (ref 80.0–100.0)
Monocytes Absolute: 0.5 10*3/uL (ref 0.1–1.0)
Monocytes Relative: 4 %
Neutro Abs: 12.6 10*3/uL — ABNORMAL HIGH (ref 1.7–7.7)
Neutrophils Relative %: 93 %
Platelets: 321 10*3/uL (ref 150–400)
RBC: 2.82 MIL/uL — ABNORMAL LOW (ref 4.22–5.81)
RDW: 16.3 % — ABNORMAL HIGH (ref 11.5–15.5)
WBC: 13.5 10*3/uL — ABNORMAL HIGH (ref 4.0–10.5)
nRBC: 0 % (ref 0.0–0.2)
nRBC: 0 /100{WBCs}

## 2022-10-29 LAB — OSMOLALITY, URINE: Osmolality, Ur: 467 mOsm/kg (ref 300–900)

## 2022-10-29 LAB — IRON AND TIBC
Iron: 29 ug/dL — ABNORMAL LOW (ref 45–182)
Saturation Ratios: 18 % (ref 17.9–39.5)
TIBC: 164 ug/dL — ABNORMAL LOW (ref 250–450)
UIBC: 135 ug/dL

## 2022-10-29 LAB — BASIC METABOLIC PANEL
Anion gap: 11 (ref 5–15)
BUN: 12 mg/dL (ref 8–23)
CO2: 24 mmol/L (ref 22–32)
Calcium: 7.5 mg/dL — ABNORMAL LOW (ref 8.9–10.3)
Chloride: 91 mmol/L — ABNORMAL LOW (ref 98–111)
Creatinine, Ser: 0.81 mg/dL (ref 0.61–1.24)
GFR, Estimated: >60 mL/min (ref >60)
Glucose, Bld: 65 mg/dL — ABNORMAL LOW (ref 70–99)
Potassium: 3.8 mmol/L (ref 3.5–5.1)
Sodium: 126 mmol/L — ABNORMAL LOW (ref 135–145)

## 2022-10-29 LAB — URINALYSIS, COMPLETE (UACMP) WITH MICROSCOPIC
Bacteria, UA: NONE SEEN
Bilirubin Urine: NEGATIVE
Glucose, UA: NEGATIVE mg/dL
Hgb urine dipstick: NEGATIVE
Ketones, ur: 5 mg/dL — AB
Leukocytes,Ua: NEGATIVE
Nitrite: NEGATIVE
Protein, ur: NEGATIVE mg/dL
Specific Gravity, Urine: 1.012 (ref 1.005–1.030)
pH: 6 (ref 5.0–8.0)

## 2022-10-29 LAB — VITAMIN B12: Vitamin B-12: 536 pg/mL (ref 180–914)

## 2022-10-29 LAB — OSMOLALITY: Osmolality: 267 mOsm/kg — ABNORMAL LOW (ref 275–295)

## 2022-10-29 LAB — MAGNESIUM: Magnesium: 1.8 mg/dL (ref 1.7–2.4)

## 2022-10-29 LAB — CORTISOL: Cortisol, Plasma: 18.5 ug/dL

## 2022-10-29 LAB — FOLATE: Folate: 17.8 ng/mL (ref >5.9)

## 2022-10-29 LAB — FERRITIN: Ferritin: 678 ng/mL — ABNORMAL HIGH (ref 24–336)

## 2022-10-29 LAB — CREATININE, URINE, RANDOM: Creatinine, Urine: 79 mg/dL

## 2022-10-29 LAB — PHOSPHORUS: Phosphorus: 2.4 mg/dL — ABNORMAL LOW (ref 2.5–4.6)

## 2022-10-29 LAB — SODIUM, URINE, RANDOM: Sodium, Ur: 89 mmol/L

## 2022-10-29 LAB — TSH: TSH: 2.282 u[IU]/mL (ref 0.350–4.500)

## 2022-10-29 MED ORDER — SODIUM CHLORIDE 0.9 % IV SOLN: INTRAVENOUS | Status: AC

## 2022-10-29 MED ORDER — CLOPIDOGREL BISULFATE 75 MG PO TABS
75.0000 mg | ORAL_TABLET | Freq: Every day | ORAL | Status: DC
  Administered 2022-10-29 – 2022-11-12 (×15): 75 mg via ORAL
  Filled 2022-10-29 (×15): qty 1

## 2022-10-29 MED ORDER — CHLORHEXIDINE GLUCONATE 0.12 % MT SOLN
15.0000 mL | Freq: Two times a day (BID) | OROMUCOSAL | Status: AC
  Administered 2022-10-29 – 2022-10-31 (×2): 15 mL via OROMUCOSAL
  Filled 2022-10-29 (×4): qty 15

## 2022-10-29 MED ORDER — K PHOS MONO-SOD PHOS DI & MONO 155-852-130 MG PO TABS
250.0000 mg | ORAL_TABLET | Freq: Two times a day (BID) | ORAL | Status: AC
  Administered 2022-10-29 – 2022-11-01 (×6): 250 mg via ORAL
  Filled 2022-10-29 (×8): qty 1

## 2022-10-29 NOTE — Progress Notes (Signed)
   Palliative Medicine Inpatient Follow Up Note  HPI: 89 y.o. male   admitted on 10/22/2022 with history of a right to left femorofemoral bypass with common and external iliac artery stenting on the right with a known abdominal aortic aneurysm.  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 Palliative care support given patient acute on chronic disease burden.   Today's Discussion 10/29/2022  *Please note that this is a verbal dictation therefore any spelling or grammatical errors are due to the "[employer]" system interpretation.  Chart reviewed inclusive of vital signs, progress notes, laboratory results, and diagnostic images.  I met with Edward Campos this morning. He and I reviewed his present health state. He is aware that he must remain in bed at this time out of the concern for bleeding. We discussed plan for re-imaging this afternoon.   Suliman shares some grievances about staff and frustrations  about his present condition.  I shared the importance of conversations regarding his wishes moving into the future.   I called Peggye Campos daughter, Edward Campos who plans to meet with our team tomorrow at 10AM.  Questions and concerns addressed/Palliative Support Provided.   Objective Assessment: Vital Signs Vitals:   10/29/22 0600 10/29/22 0748  BP: 132/78 109/79  Pulse:  84  Resp: 19 17  Temp:  98.3 F (36.8 C)  SpO2: 100% 98%    Intake/Output Summary (Last 24 hours) at 10/29/2022 1107 Last data filed at 10/29/2022 2841 Gross per 24 hour  Intake 62 ml  Output 1150 ml  Net -1088 ml   Last Weight  Most recent update: 10/25/2022  6:53 AM    Weight  75.9 kg (167 lb 5.3 oz)            Gen:  Elderly Caucasian M in NAD HEENT: moist mucous membranes CV: Regular rate and rhythm  PULM: On RA, breathing is even and nonlabored ABD: soft/nontender  EXT: No edema  Neuro: Alert and oriented x3   SUMMARY OF RECOMMENDATIONS   Full Code/ Full Scope of Care ->  Hard Choices booklet and MOST provided for review  Plan for family meeting tomorrow at 10AM  Goals are for ongoing improvement/recovery   Ongoing PMT support  Billing based on MDM: Moderate ______________________________________________________________________________________ Lamarr Lulas [facility] Palliative Medicine Team Team Cell Phone: [phone] Please utilize secure chat with additional questions, if there is no response within 30 minutes please call the above phone number  Palliative Medicine Team providers are available by phone from 7am to 7pm daily and can be reached through the team cell phone.  Should this patient require assistance outside of these hours, please call the patient's attending physician.

## 2022-10-29 NOTE — Progress Notes (Addendum)
[facility]  Subjective  - Frustrated   Objective 132/78 80 98.6 F (37 C) (Oral) 19 100%  Intake/Output Summary (Last 24 hours) at 10/29/2022 0722 Last data filed at 10/29/2022 0634 Gross per 24 hour  Intake 62 ml  Output 1150 ml  Net -1088 ml    B LE warm with intact DP doppler signals Vac with full canister bloody drainage total 500 cc,  Canister changed.  New pre vena sponge placed on right groin, no active bleeding during replacement of sponge       Assessment/Planning: Procedure: 1.  Redo exposure of bilateral common femoral arteries greater than 30 days 2.  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  Redo right to left femoral-femoral bypass graft with cadaveric great saphenous vein 4.  Left common femoral to left SFA interposition bypass with cadaveric great saphenous vein 5.  Myriad morcells bilateral groin wounds 6.  Bilateral Prevena wound VAC placement  -lost seal on left groin will exchange canister and seal left groin sponge to suction.   HGB decreased from 9.3 to 8.5, asymptomatic Right groin with more drainage than left  Will maintain vac and observe HGB   Slight improved leukocytosis 13.5  Blood cultures:Bcx 1/4 growing GPC - BCID showing MRSA. ID following currently.  Current antibiotics: Daptomycin   Mobility is good  Mosetta Pigeon 10/29/2022 7:22 AM --  VASCULAR STAFF ADDENDUM: I have independently interviewed and examined the patient. I agree with the above.  Scan today - worried recent bleed is herald bleed due to previous graft infection and known infected field.  Small hematoma cephalad portion of right groin  Bed rest NPO Scan ordered.  Pt updated regarding the above.    Fara Olden, MD [facility] Phone Number: [phone] 10/29/2022 9:16 AM    Laboratory Lab Results: Recent Labs    10/28/22 0230 10/29/22 0237   WBC 15.9* 13.5*  HGB 9.3* 8.5*  HCT 26.0* 24.1*  PLT 319 321   BMET Recent Labs    10/28/22 0230 10/29/22 0237  NA 126* 125*  K 3.3* 4.0  CL 94* 94*  CO2 21* 24  GLUCOSE 123* 103*  BUN 10 12  CREATININE 0.74 0.82  CALCIUM 7.5* 7.6*    COAG Lab Results  Component Value Date   INR 1.2 08/16/2022   INR 1.1 06/04/2019   No results found for: "PTT"

## 2022-10-29 NOTE — Progress Notes (Addendum)
PROGRESS NOTE    Edward Campos  WJX:[id number] DOB: May 13, 1932 DOA: 10/22/2022 PCP: Patient, No Pcp Per   No chief complaint on file.   Brief Narrative:  Edward Campos is a 89 y.o. male with past medical history of HLD, PVD s/p bypass grafting, AAA, SCLC who presented as a direct admission to the vascular service for concern a infected femoral bypass.  Patient had been hospitalized in June for infected right to left femoral to femoral bypass graft with temporary wound VAC placement.  Over the last week he had noticed  increased swelling, pain, bloody/possible pus drainage, reported fevers up to 101 F at home.  Records note he was advised to go to the emergency department for further evaluation, but ended up followed up in the office on 8/12.  Orders were placed to admit the patient directly to the hospital for need of removal of the graft.  Patient was noted to be afebrile with pulse 84-109, and all other vital signs relatively maintained.  Labs from yesterday significant for WBC 13.2, hemoglobin 11.5, sodium 122, calcium, and ALT 46.  MRSA PCR screen was positive.  CT angiogram of the abdomen pelvis noted right bifemoral bypass graft with fluid/inflammatory changes associated with the femorofemoral bypass most prominent on the left compatible with a graft infection, and a 5.8 infrarenal abdominal aortic aneurysm grossly unchanged in size.  Patient had been started on empiric antibiotics of vancomycin and cefepime.  He had been posted for graft excision today.  However, patient declined having graft removed this morning as he wanted time to think about his options.  He is amendable to having the graft removed at this time.  Vascular surgery asked [facility] to assume care as patient due to other comorbidities.   Patient also reports that he is continue to have a intermittently productive cough since admitted in the hospital back in July with a postobstructive pneumonia.  Denies coughing up any blood.  He has  not been able to start chemotherapy due to his wounds not being completely healed.   Assessment & Plan:   Principal Problem:   Sepsis due to methicillin resistant Staphylococcus aureus (MRSA) (HCC) Active Problems:   Sepsis (HCC)   Infection of vascular bypass graft (HCC)   Peripheral vascular disease (HCC)   Small cell lung cancer, left lower lobe (HCC)   Hypokalemia   Transaminitis   Hyponatremia   AAA (abdominal aortic aneurysm) (HCC)   PAD (peripheral artery disease) (HCC)   MRSA bacteremia  #1 sepsis secondary to infected left femoral graft MRSA, MRSA bacteremia, POA -Patient met criteria for sepsis on admission with tachycardia, leukocytosis, fevers of 101 Fahrenheit prior to admission. -MRSA screening noted to be positive. -CT angiogram abdomen and pelvis noted right bifemoral bypass graft with fluid/inflammatory changes associated with femoral-femoral bypass most prominent on the left compatible with graft infection. -Blood cultures and lactic acid levels obtained and blood cultures positive for MRSA.  -Patient noted to have received IV Vanco and cefepime prior to blood cultures being drawn. -Patient seen by vascular surgery who initially had planned on removing graft, patient noted to have initially declined needing more time to think about it and patient subsequently agreed for graft removal.   -Patient underwent excision of infected right to left femorofemoral bypass graft with redo right to left femorofemoral bypass graft with cadaveric great saphenous vein, left common femoral to left SFA interposition bypass with cadaveric great saphenous vein, redo exposure of bilateral common femoral arteries greater than 30 days,  my read more cells bilateral groin wounds, bilateral Prevena wound VAC placement.(10/25/2022) -Significant purulence noted in the left groin and the common femoral artery in the left groin significantly scarred in place and friable per op note findings.  Cultures  sent and positive for MRSA.    -Patient was on IV Vanco and IV cefepime.   -Patient seen in consultation by ID and antibiotics changed to IV daptomycin monotherapy.   -Due to positive blood cultures of MRSA, 2D echo was obtained with normal EF, NWMA, diastolic dysfunction, no vegetations noted. -Patient noted to have a right groin hematoma, CT abdomen and pelvis ordered per vascular surgery. -Repeat blood cultures ordered per ID. -ID recommending probable TEE to rule out endocarditis. -Follow H&H. -ID following and appreciate input and recommendations.   -Per vascular surgery.    2.  Acute on chronic hyponatremia -Baseline sodium 128-131. -On initial presentation sodium noted at 122 with repeat at 126. -Patient with history of small cell lung cancer. -Sodium currently at 125 this morning. -Will check a urine sodium, urine creatinine, serum iron urine osmolality, TSH, cortisol level. -Gentle hydration for the next 24 hours. -Repeat BMET.  3.  Hypokalemia/hypophosphatemia -Potassium of 4.0, phosphorus at 2.4, magnesium at 1.8.   -K-Phos tablets 250 mg p.o. twice daily x 4 days.   -Repeat labs in the AM.   4.  Small cell lung cancer with bone metastases -Patient noted stage IV lung cancer with disease to the bone and prior postobstructive pneumonia. -CT scan done with branching masslike opacity measuring 5.2 x 7.9 cm in the left hilar region/central left lower lobe similar in size to prior. -Chemotherapy noted not to have been initiated due to wounds not being healed yet. -Palliative care consulted for goals of care. -Outpatient follow-up with primary oncologist.  5.  PAD -Aspirin.   -Statin held due to transaminitis.  -It was initially thought patient was on Plavix prior to admission, however in reviewing med rec and in discussion with patient he was not chronically on Plavix but had been on Plavix during recent prior hospitalizations.   -Vascular surgery following.    6.   Transaminitis -??  Etiology. -CT angiogram abdomen and pelvis with no acute abnormalities noted in the left. -LFTs were trending up but trending back down with hydration.. -Acute hepatitis panel negative.   -Crestor discontinued.   -Saline lock IV fluids. -Follow.  7.  Hyperlipidemia -Crestor held secondary to rising LFTs.   -LFTs trending down.   -Likely resume Crestor on discharge.  8.  AAA -Patient with 5.8 x 5.7 cm infrarenal abdominal aortic aneurysm unchanged in size. -Vascular surgery following.     DVT prophylaxis: Heparin Code Status: Full Family Communication: Updated patient, no family at bedside.. Disposition: SNF once medically stable and cleared by vascular surgery and ID.Marland Kitchen   Status is: Inpatient Remains inpatient appropriate because: Severity of illness   Consultants:  Palliative care: Lorinda Creed, NP 10/24/2022 [facility] hospitalist: Dr. Katrinka Blazing 10/23/2022 Vascular surgery ID:: Dr. Daiva Eves 10/26/2022  Procedures:  CT angiogram abdomen and pelvis 10/22/2022 1.  Redo exposure of bilateral common femoral arteries greater than 30 days 2.  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  Redo right to left femoral-femoral bypass graft with cadaveric great saphenous vein 4.  Left common femoral to left SFA interposition bypass with cadaveric great saphenous vein 5.  Myriad morcells bilateral groin wounds 6.  Bilateral Prevena wound VAC placement ----Per vascular surgery: Dr. Chestine Spore and Dr. Sherral Hammers 10/25/2022.  2D echo 10/27/2022 CT abdomen and pelvis pending 10/29/2022  Antimicrobials: Anti-infectives (From admission, onward)    Start     Dose/Rate Route Frequency Ordered Stop   10/26/22 0945  DAPTOmycin (CUBICIN) 600 mg in sodium chloride 0.9 % IVPB        8 mg/kg  75.9 kg 124 mL/hr over 30 Minutes Intravenous Daily 10/26/22 0855     10/26/22 0945  cefTRIAXone (ROCEPHIN) 2 g in sodium chloride 0.9 % 100 mL IVPB  Status:  Discontinued        2  g 200 mL/hr over 30 Minutes Intravenous Every 24 hours 10/26/22 0855 10/26/22 0905   10/25/22 1006  ceFAZolin 1 g / gentamicin 80 mg in NS 500 mL surgical irrigation  Status:  Discontinued          As needed 10/25/22 1006 10/25/22 1318   10/23/22 1145  vancomycin (VANCOREADY) IVPB 1250 mg/250 mL  Status:  Discontinued        1,250 mg 166.7 mL/hr over 90 Minutes Intravenous Every 24 hours 10/23/22 1051 10/26/22 0855   10/23/22 1100  ceFEPIme (MAXIPIME) 2 g in sodium chloride 0.9 % 100 mL IVPB  Status:  Discontinued        2 g 200 mL/hr over 30 Minutes Intravenous Every 12 hours 10/23/22 1041 10/26/22 0855         Subjective: Laying in bed.  Frustrated.  Denies any chest pain or shortness of breath.  States having some lower abdominal discomfort.  States was not on Plavix long-term prior to admission however did receive some days of Plavix during prior hospitalizations.  Objective: Vitals:   10/29/22 0000 10/29/22 0400 10/29/22 0600 10/29/22 0748  BP: 122/70 98/77 132/78 109/79  Pulse:    84  Resp: 18 19 19 17   Temp: 98.5 F (36.9 C) 98.6 F (37 C)  98.3 F (36.8 C)  TempSrc:  Oral  Oral  SpO2: 100% 100% 100% 98%  Weight:      Height:        Intake/Output Summary (Last 24 hours) at 10/29/2022 1055 Last data filed at 10/29/2022 0634 Gross per 24 hour  Intake 62 ml  Output 1150 ml  Net -1088 ml   Filed Weights   10/23/22 0431 10/25/22 0645 10/25/22 0651  Weight: 75.9 kg 75.9 kg 75.9 kg    Examination:  General exam: NAD. Respiratory system: CTAB.  No wheezes, no crackles, no rhonchi.  Fair air movement.  Speaking in full sentences.  Cardiovascular system: Regular rate rhythm no murmurs rubs or gallops.  No JVD.  No pitting lower extremity edema. Gastrointestinal system: Abdomen is soft, nontender, nondistended, positive bowel sounds.  Bilateral wound vacs in groin region.  Right groin hematoma noted. Central nervous system: Alert and oriented. No focal neurological  deficits. Extremities: Symmetric 5 x 5 power. Skin: No rashes, lesions or ulcers Psychiatry: Judgement and insight appear normal. Mood & affect appropriate.     Data Reviewed: I have personally reviewed following labs and imaging studies  CBC: Recent Labs  Lab 10/24/22 0940 10/25/22 0524 10/25/22 1001 10/26/22 0120 10/26/22 0748 10/27/22 0527 10/28/22 0230 10/29/22 0237  WBC 14.5* 10.6*   < > 10.6* 11.3* 13.3* 15.9* 13.5*  NEUTROABS 12.0* 8.7*  --  8.9*  --   --   --  12.6*  HGB 12.6* 11.1*   < > 8.7* 9.1* 9.3* 9.3* 8.5*  HCT 35.3* 31.5*   < > 24.8* 25.5* 26.3* 26.0* 24.1*  MCV 83.1 82.7   < >  85.5 85.0 83.2 85.0 85.5  PLT 278 254   < > 204 226 263 319 321   < > = values in this interval not displayed.    Basic Metabolic Panel: Recent Labs  Lab 10/25/22 0524 10/25/22 1001 10/26/22 0120 10/26/22 0748 10/27/22 0527 10/28/22 0230 10/29/22 0237  NA 127*   < > 127* 128* 127* 126* 125*  K 4.3   < > 4.0 3.8 3.6 3.3* 4.0  CL 96*  --  99 99 96* 94* 94*  CO2 25  --  22 22 24  21* 24  GLUCOSE 118*  --  131* 116* 119* 123* 103*  BUN 19  --  20 17 12 10 12   CREATININE 0.86   < > 0.86 0.76 0.92 0.74 0.82  CALCIUM 8.1*  --  7.5* 7.7* 7.6* 7.5* 7.6*  MG 1.8  --  1.7  --  1.7 1.9 1.8  PHOS  --   --   --   --  1.9* 2.4* 2.4*   < > = values in this interval not displayed.    GFR: Estimated Creatinine Clearance: 63.5 mL/min (by C-G formula based on SCr of 0.82 mg/dL).  Liver Function Tests: Recent Labs  Lab 10/25/22 0524 10/26/22 0120 10/27/22 0527 10/28/22 0230 10/29/22 0237  AST 110* 55* 46* 37 32  ALT 133* 87* 81* 64* 53*  ALKPHOS 82 48 68 73 65  BILITOT 0.9 1.0 0.9 1.2 1.1  PROT 5.4* 4.6* 5.4* 5.3* 5.1*  ALBUMIN 2.2* 2.2* 2.4* 2.3* 2.1*    CBG: No results for input(s): "GLUCAP" in the last 168 hours.   Recent Results (from the past 240 hour(s))  Surgical pcr screen     Status: Abnormal   Collection Time: 10/23/22  7:44 AM   Specimen: Nasal Mucosa; Nasal  Swab  Result Value Ref Range Status   MRSA, PCR POSITIVE (A) NEGATIVE Final    Comment: RESULT CALLED TO, READ BACK BY AND VERIFIED WITH: RN MIA RINAL [id number] AT 1029 BY EC    Staphylococcus aureus POSITIVE (A) NEGATIVE Final    Comment: (NOTE) The Xpert SA Assay (FDA approved for NASAL specimens in patients 12 years of age and older), is one component of a comprehensive surveillance program. It is not intended to diagnose infection nor to guide or monitor treatment. Performed at [facility], [address], [place], [place] [address]   Culture, blood (Routine X 2) w Reflex to ID Panel     Status: Abnormal   Collection Time: 10/23/22 12:24 PM   Specimen: BLOOD  Result Value Ref Range Status   Specimen Description BLOOD RIGHT ANTECUBITAL  Final   Special Requests Blood Culture adequate volume  Final   Culture  Setup Time   Final    GRAM POSITIVE COCCI ANAEROBIC BOTTLE ONLY CRITICAL RESULT CALLED TO, READ BACK BY AND VERIFIED WITH: K HURTH,PHARMD@[id number]  10/27/22 MK Performed at [facility], [address] [place], [place] [address]    Culture METHICILLIN RESISTANT STAPHYLOCOCCUS AUREUS (A)  Final   Report Status 10/29/2022 FINAL  Final   Organism ID, Bacteria METHICILLIN RESISTANT STAPHYLOCOCCUS AUREUS  Final      Susceptibility   Methicillin resistant staphylococcus aureus - MIC*    CIPROFLOXACIN >=8 RESISTANT Resistant     ERYTHROMYCIN <=0.25 SENSITIVE Sensitive     GENTAMICIN <=0.5 SENSITIVE Sensitive     OXACILLIN >=4 RESISTANT Resistant     TETRACYCLINE <=1 SENSITIVE Sensitive     VANCOMYCIN 1  SENSITIVE Sensitive     TRIMETH/SULFA <=10 SENSITIVE Sensitive     CLINDAMYCIN <=0.25 SENSITIVE Sensitive     RIFAMPIN <=0.5 SENSITIVE Sensitive     Inducible Clindamycin NEGATIVE Sensitive     LINEZOLID 2 SENSITIVE Sensitive     * METHICILLIN RESISTANT STAPHYLOCOCCUS AUREUS  Culture, blood (Routine X 2) w Reflex to ID Panel     Status: None   Collection Time:  10/23/22 12:24 PM   Specimen: BLOOD  Result Value Ref Range Status   Specimen Description BLOOD BLOOD RIGHT FOREARM  Final   Special Requests   Final    BOTTLES DRAWN AEROBIC AND ANAEROBIC Blood Culture adequate volume   Culture   Final    NO GROWTH 5 DAYS Performed at [facility], [address], [place], [place] [address]    Report Status 10/28/2022 FINAL  Final  Blood Culture ID Panel (Reflexed)     Status: Abnormal   Collection Time: 10/23/22 12:24 PM  Result Value Ref Range Status   Enterococcus faecalis NOT DETECTED NOT DETECTED Final   Enterococcus Faecium NOT DETECTED NOT DETECTED Final   Listeria monocytogenes NOT DETECTED NOT DETECTED Final   Staphylococcus species DETECTED (A) NOT DETECTED Final    Comment: CRITICAL RESULT CALLED TO, READ BACK BY AND VERIFIED WITH: K HURTH,PHARMD@0354  10/27/22 MK    Staphylococcus aureus (BCID) DETECTED (A) NOT DETECTED Final    Comment: Methicillin (oxacillin)-resistant Staphylococcus aureus (MRSA). MRSA is predictably resistant to beta-lactam antibiotics (except ceftaroline). Preferred therapy is vancomycin unless clinically contraindicated. Patient requires contact precautions if  hospitalized. CRITICAL RESULT CALLED TO, READ BACK BY AND VERIFIED WITH: K HURTH,PHARMD@[email or web address]  10/27/22 MK    Staphylococcus epidermidis NOT DETECTED NOT DETECTED Final   Staphylococcus lugdunensis NOT DETECTED NOT DETECTED Final   Streptococcus species NOT DETECTED NOT DETECTED Final   Streptococcus agalactiae NOT DETECTED NOT DETECTED Final   Streptococcus pneumoniae NOT DETECTED NOT DETECTED Final   Streptococcus pyogenes NOT DETECTED NOT DETECTED Final   A.calcoaceticus-baumannii NOT DETECTED NOT DETECTED Final   Bacteroides fragilis NOT DETECTED NOT DETECTED Final   Enterobacterales NOT DETECTED NOT DETECTED Final   Enterobacter cloacae complex NOT DETECTED NOT DETECTED Final   Escherichia coli NOT DETECTED NOT DETECTED Final   Klebsiella  aerogenes NOT DETECTED NOT DETECTED Final   Klebsiella oxytoca NOT DETECTED NOT DETECTED Final   Klebsiella pneumoniae NOT DETECTED NOT DETECTED Final   Proteus species NOT DETECTED NOT DETECTED Final   Salmonella species NOT DETECTED NOT DETECTED Final   Serratia marcescens NOT DETECTED NOT DETECTED Final   Haemophilus influenzae NOT DETECTED NOT DETECTED Final   Neisseria meningitidis NOT DETECTED NOT DETECTED Final   Pseudomonas aeruginosa NOT DETECTED NOT DETECTED Final   Stenotrophomonas maltophilia NOT DETECTED NOT DETECTED Final   Candida albicans NOT DETECTED NOT DETECTED Final   Candida auris NOT DETECTED NOT DETECTED Final   Candida glabrata NOT DETECTED NOT DETECTED Final   Candida krusei NOT DETECTED NOT DETECTED Final   Candida parapsilosis NOT DETECTED NOT DETECTED Final   Candida tropicalis NOT DETECTED NOT DETECTED Final   Cryptococcus neoformans/gattii NOT DETECTED NOT DETECTED Final   Meth resistant mecA/C and MREJ DETECTED (A) NOT DETECTED Final    Comment: CRITICAL RESULT CALLED TO, READ BACK BY AND VERIFIED WITH: K HURTH,[email or web address]@[email or web address]  10/27/22 MK Performed at [facility], [address] [place], [place] [address]   Aerobic/Anaerobic Culture w Gram Stain (surgical/deep wound)     Status: None (Preliminary result)  Collection Time: 10/25/22 12:35 PM   Specimen: Path fluid; Tissue  Result Value Ref Range Status   Specimen Description FLUID  Final   Special Requests infected bilateral femoral graft  Final   Gram Stain   Final    ABUNDANT WBC PRESENT, PREDOMINANTLY PMN ABUNDANT GRAM POSITIVE COCCI IN PAIRS IN CLUSTERS Performed at [facility], [address] [place], [place] [address]    Culture   Final    MODERATE STAPHYLOCOCCUS AUREUS NO ANAEROBES ISOLATED; CULTURE IN PROGRESS FOR 5 DAYS    Report Status PENDING  Incomplete   Organism ID, Bacteria STAPHYLOCOCCUS AUREUS  Final      Susceptibility   Staphylococcus aureus - MIC*    CIPROFLOXACIN  >=8 RESISTANT Resistant     ERYTHROMYCIN <=0.25 SENSITIVE Sensitive     GENTAMICIN <=0.5 SENSITIVE Sensitive     OXACILLIN >=4 RESISTANT Resistant     TETRACYCLINE <=1 SENSITIVE Sensitive     VANCOMYCIN <=0.5 SENSITIVE Sensitive     TRIMETH/SULFA <=10 SENSITIVE Sensitive     CLINDAMYCIN <=0.25 SENSITIVE Sensitive     RIFAMPIN <=0.5 SENSITIVE Sensitive     Inducible Clindamycin NEGATIVE Sensitive     LINEZOLID 2 SENSITIVE Sensitive     * MODERATE STAPHYLOCOCCUS AUREUS  Culture, blood (Routine X 2) w Reflex to ID Panel     Status: None (Preliminary result)   Collection Time: 10/27/22  2:46 PM   Specimen: BLOOD RIGHT HAND  Result Value Ref Range Status   Specimen Description BLOOD RIGHT HAND  Final   Special Requests   Final    BOTTLES DRAWN AEROBIC AND ANAEROBIC Blood Culture adequate volume   Culture  Setup Time   Final    GRAM POSITIVE COCCI AEROBIC BOTTLE ONLY CRITICAL VALUE NOTED.  VALUE IS CONSISTENT WITH PREVIOUSLY REPORTED AND CALLED VALUE.    Culture   Final    NO GROWTH 2 DAYS Performed at [facility], [address] [place], [place] [address]    Report Status PENDING  Incomplete  Culture, blood (Routine X 2) w Reflex to ID Panel     Status: None (Preliminary result)   Collection Time: 10/27/22  2:47 PM   Specimen: BLOOD RIGHT ARM  Result Value Ref Range Status   Specimen Description BLOOD RIGHT ARM  Final   Special Requests   Final    BOTTLES DRAWN AEROBIC AND ANAEROBIC Blood Culture adequate volume   Culture   Final    NO GROWTH 2 DAYS Performed at [facility], [address] [place], [place] [address]    Report Status PENDING  Incomplete         Radiology Studies: ECHOCARDIOGRAM COMPLETE  Result Date: 10/27/2022    ECHOCARDIOGRAM REPORT   Patient Name:   Edward Campos Date of Exam: 10/27/2022 Medical Rec #:  [id number]      Height:       69.0 in Accession #:    [id number]     Weight:       167.3 lb Date of Birth:  [birth year]       BSA:           1.915 m Patient Age:    89 years       BP:           155/67 mmHg Patient Gender: M              HR:           81  bpm. Exam Location:  Inpatient Procedure: 2D Echo, Cardiac Doppler and Color Doppler Indications:    Bacteremia  History:        Patient has prior history of Echocardiogram examinations, most                 recent 11/10/2020. PVD; Risk Factors:Dyslipidemia. SCLC, AAA.  Sonographer:    Milda Smart Referring Phys: [id number] Smaran Gaus V Moncia Annas  Sonographer Comments: Image acquisition challenging due to patient body habitus and Image acquisition challenging due to respiratory motion. IMPRESSIONS  1. Left ventricular ejection fraction, by estimation, is 60 to 65%. The left ventricle has normal function. The left ventricle has no regional wall motion abnormalities. There is moderate concentric left ventricular hypertrophy. Left ventricular diastolic parameters are consistent with Grade I diastolic dysfunction (impaired relaxation). Elevated left atrial pressure.  2. Right ventricular systolic function is normal. The right ventricular size is normal.  3. Left atrial size was mildly dilated.  4. The mitral valve is normal in structure. Mild mitral valve regurgitation. No evidence of mitral stenosis. Moderate mitral annular calcification.  5. The aortic valve is tricuspid. Aortic valve regurgitation is not visualized. No aortic stenosis is present.  6. Aortic dilatation noted. There is borderline dilatation of the aortic root, measuring 39 mm.  7. The inferior vena cava is normal in size with greater than 50% respiratory variability, suggesting right atrial pressure of 3 mmHg. FINDINGS  Left Ventricle: Left ventricular ejection fraction, by estimation, is 60 to 65%. The left ventricle has normal function. The left ventricle has no regional wall motion abnormalities. The left ventricular internal cavity size was normal in size. There is  moderate concentric left ventricular hypertrophy. Left ventricular diastolic  parameters are consistent with Grade I diastolic dysfunction (impaired relaxation). Elevated left atrial pressure. Right Ventricle: The right ventricular size is normal. Right ventricular systolic function is normal. Left Atrium: Left atrial size was mildly dilated. Right Atrium: Right atrial size was normal in size. Pericardium: There is no evidence of pericardial effusion. Mitral Valve: The mitral valve is normal in structure. Moderate mitral annular calcification. Mild mitral valve regurgitation. No evidence of mitral valve stenosis. MV peak gradient, 8.2 mmHg. The mean mitral valve gradient is 4.0 mmHg. Tricuspid Valve: The tricuspid valve is normal in structure. Tricuspid valve regurgitation is mild . No evidence of tricuspid stenosis. Aortic Valve: The aortic valve is tricuspid. Aortic valve regurgitation is not visualized. No aortic stenosis is present. Pulmonic Valve: The pulmonic valve was grossly normal. Pulmonic valve regurgitation is trivial. No evidence of pulmonic stenosis. Aorta: Aortic dilatation noted. There is borderline dilatation of the aortic root, measuring 39 mm. Venous: The inferior vena cava is normal in size with greater than 50% respiratory variability, suggesting right atrial pressure of 3 mmHg. IAS/Shunts: No atrial level shunt detected by color flow Doppler.  LEFT VENTRICLE PLAX 2D LVIDd:         3.70 cm     Diastology LVIDs:         3.10 cm     LV e' medial:    5.44 cm/s LV PW:         1.50 cm     LV E/e' medial:  19.7 LV IVS:        1.40 cm     LV e' lateral:   6.09 cm/s LVOT diam:     2.50 cm     LV E/e' lateral: 17.6 LV SV:         116  LV SV Index:   60 LVOT Area:     4.91 cm  LV Volumes (MOD) LV vol d, MOD A2C: 58.7 ml LV vol d, MOD A4C: 76.9 ml LV vol s, MOD A2C: 21.0 ml LV vol s, MOD A4C: 20.5 ml LV SV MOD A2C:     37.7 ml LV SV MOD A4C:     76.9 ml LV SV MOD BP:      48.8 ml RIGHT VENTRICLE             IVC RV S prime:     20.40 cm/s  IVC diam: 1.60 cm TAPSE (M-mode): 3.7 cm  LEFT ATRIUM             Index        RIGHT ATRIUM           Index LA diam:        3.90 cm 2.04 cm/m   RA Area:     16.30 cm LA Vol (A2C):   68.2 ml 35.61 ml/m  RA Volume:   41.00 ml  21.41 ml/m LA Vol (A4C):   61.3 ml 32.01 ml/m LA Biplane Vol: 64.7 ml 33.78 ml/m  AORTIC VALVE LVOT Vmax:   105.15 cm/s LVOT Vmean:  75.500 cm/s LVOT VTI:    0.236 m  AORTA Ao Root diam: 3.60 cm Ao Asc diam:  3.90 cm MITRAL VALVE                TRICUSPID VALVE MV Area (PHT): 2.24 cm     TR Peak grad:   21.3 mmHg MV Area VTI:   2.61 cm     TR Vmax:        231.00 cm/s MV Peak grad:  8.2 mmHg MV Mean grad:  4.0 mmHg     SHUNTS MV Vmax:       1.43 m/s     Systemic VTI:  0.24 m MV Vmean:      101.0 cm/s   Systemic Diam: 2.50 cm MV Decel Time: 338 msec MR Peak grad: 32.9 mmHg MR Vmax:      287.00 cm/s MV E velocity: 107.00 cm/s MV A velocity: 129.00 cm/s MV E/A ratio:  0.83 Olga Millers MD Electronically signed by Olga Millers MD Signature Date/Time: 10/27/2022/2:38:00 PM    Final         Scheduled Meds:  aspirin EC  81 mg Oral Daily   chlorhexidine  15 mL Mouth/Throat BID   clopidogrel  75 mg Oral Daily   docusate sodium  100 mg Oral Daily   heparin  5,000 Units Subcutaneous Q8H   multivitamin with minerals  1 tablet Oral Daily   pantoprazole  40 mg Oral Daily   phosphorus  250 mg Oral BID   sodium chloride flush  3 mL Intravenous Q12H   Continuous Infusions:  sodium chloride     sodium chloride     DAPTOmycin (CUBICIN) 600 mg in sodium chloride 0.9 % IVPB Stopped (10/28/22 1542)   lactated ringers       LOS: 7 days    Time spent: 40 minutes    Ramiro Harvest, MD [facility]   To contact the attending provider between 7A-7P or the covering provider during after hours 7P-7A, please log into the web site [email or web address] and access using universal  password for that web site. If you do not have the password, please call the hospital operator.  10/29/2022, 10:55 AM

## 2022-10-29 NOTE — Progress Notes (Signed)
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 Blood cultures from 10/27/22 have remained without growth thus far. TTE was negative however with new findings of bacteremia would be appropriate for TEE to rule out endocarditis. CT angio abdomen pelvis is pending. Continue post-operative wound care and wound vac management per Vascular Surgery. Monitor blood cultures for clearance of bacteremia. Duration of antibiotic therapy to be determined via additional work up. CK level 104 on 8/17 and appears to be tolerating Daptomycin with no adverse side effect. Remaining medical and supportive care per Internal Medicine. Plan discussed with Edward Campos and questions answered.    PLAN:  Continue current dose of daptomycin.  Post-operative wound care and wound vac per Vascular Surgery. Monitor blood cultures for clearance of bacteremia.  Therapeutic drug monitoring CK levels while on daptomycin. CT angio abdomen/pelvis is pending. Recommend TEE when safe/able to rule out endocarditis.  Remaining medical and supportive care per Internal Medicine.   Principal Problem:   Sepsis due to methicillin resistant Staphylococcus aureus (MRSA) (HCC) Active Problems:   Peripheral vascular disease (HCC)   Infection of vascular bypass graft (HCC)   Small cell lung cancer, left lower lobe (HCC)   Sepsis (HCC)   Hypokalemia   Transaminitis   Hyponatremia   AAA (abdominal aortic aneurysm) (HCC)   PAD (peripheral artery disease) (HCC)   MRSA bacteremia    aspirin EC  81 mg Oral Daily   chlorhexidine  15 mL Mouth/Throat BID   clopidogrel  75 mg Oral Daily   docusate sodium  100 mg Oral Daily   heparin  5,000 Units Subcutaneous Q8H   multivitamin with minerals  1 tablet Oral Daily   pantoprazole  40 mg Oral Daily   phosphorus  250 mg Oral  BID   sodium chloride flush  3 mL Intravenous Q12H    SUBJECTIVE:  Afebrile overnight with no acute events. Frustrated regarding his current situation.   Allergies  Allergen Reactions   Penicillins Other (See Comments)     Review of Systems: Review of Systems  Constitutional:  Negative for chills, fever and weight loss.  Respiratory:  Negative for cough, shortness of breath and wheezing.   Cardiovascular:  Negative for chest pain and leg swelling.  Gastrointestinal:  Negative for abdominal pain, constipation, diarrhea, nausea and vomiting.  Skin:  Negative for rash.      OBJECTIVE: Vitals:   10/29/22 0400 10/29/22 0600 10/29/22 0748 10/29/22 1138  BP: 98/77 132/78 109/79 (!) 142/79  Pulse:   84 83  Resp: 19 19 17 18   Temp: 98.6 F (37 C)  98.3 F (36.8 C) (!) 97.2 F (36.2 C)  TempSrc: Oral  Oral Oral  SpO2: 100% 100% 98% 94%  Weight:      Height:       Body mass index is 24.71 kg/m.  Physical Exam Constitutional:      General: He is not in acute distress.    Appearance: He is well-developed.     Comments: Lying in bed with head of bed slightly elevated; pleasant.   Cardiovascular:     Rate and Rhythm: Normal rate and regular rhythm.     Heart sounds: Normal heart sounds.     Comments: Wound vacs in place. There is bloody drainage.  Pulmonary:  Effort: Pulmonary effort is normal.     Breath sounds: Normal breath sounds.  Skin:    General: Skin is warm and dry.  Neurological:     Mental Status: He is alert.  Psychiatric:        Mood and Affect: Mood normal.     Lab Results Lab Results  Component Value Date   WBC 13.5 (H) 10/29/2022   HGB 8.5 (L) 10/29/2022   HCT 24.1 (L) 10/29/2022   MCV 85.5 10/29/2022   PLT 321 10/29/2022    Lab Results  Component Value Date   CREATININE 0.82 10/29/2022   BUN 12 10/29/2022   NA 125 (L) 10/29/2022   K 4.0 10/29/2022   CL 94 (L) 10/29/2022   CO2 24 10/29/2022    Lab Results  Component Value Date    ALT 53 (H) 10/29/2022   AST 32 10/29/2022   ALKPHOS 65 10/29/2022   BILITOT 1.1 10/29/2022     Microbiology: Recent Results (from the past 240 hour(s))  Surgical pcr screen     Status: Abnormal   Collection Time: 10/23/22  7:44 AM   Specimen: Nasal Mucosa; Nasal Swab  Result Value Ref Range Status   MRSA, PCR POSITIVE (A) NEGATIVE Final    Comment: RESULT CALLED TO, READ BACK BY AND VERIFIED WITH: RN MIA RINAL [id number] AT 1029 BY EC    Staphylococcus aureus POSITIVE (A) NEGATIVE Final    Comment: (NOTE) The Xpert SA Assay (FDA approved for NASAL specimens in patients 63 years of age and older), is one component of a comprehensive surveillance program. It is not intended to diagnose infection nor to guide or monitor treatment. Performed at [facility], [address], [place], [place] [address]   Culture, blood (Routine X 2) w Reflex to ID Panel     Status: Abnormal (Preliminary result)   Collection Time: 10/23/22 12:24 PM   Specimen: BLOOD  Result Value Ref Range Status   Specimen Description BLOOD RIGHT ANTECUBITAL  Final   Special Requests Blood Culture adequate volume  Final   Culture  Setup Time   Final    GRAM POSITIVE COCCI ANAEROBIC BOTTLE ONLY CRITICAL RESULT CALLED TO, READ BACK BY AND VERIFIED WITH: K HURTH,PHARMD@0354  10/27/22 MK    Culture (A)  Final    METHICILLIN RESISTANT STAPHYLOCOCCUS AUREUS CULTURE REINCUBATED FOR BETTER GROWTH Performed at [facility], [address], [place], [place] [address]    Report Status PENDING  Incomplete   Organism ID, Bacteria METHICILLIN RESISTANT STAPHYLOCOCCUS AUREUS  Final      Susceptibility   Methicillin resistant staphylococcus aureus - MIC*    CIPROFLOXACIN >=8 RESISTANT Resistant     ERYTHROMYCIN <=0.25 SENSITIVE Sensitive     GENTAMICIN <=0.5 SENSITIVE Sensitive     OXACILLIN >=4 RESISTANT Resistant     TETRACYCLINE <=1 SENSITIVE Sensitive     VANCOMYCIN 1 SENSITIVE Sensitive     TRIMETH/SULFA  <=10 SENSITIVE Sensitive     CLINDAMYCIN <=0.25 SENSITIVE Sensitive     RIFAMPIN <=0.5 SENSITIVE Sensitive     Inducible Clindamycin NEGATIVE Sensitive     LINEZOLID 2 SENSITIVE Sensitive     * METHICILLIN RESISTANT STAPHYLOCOCCUS AUREUS  Culture, blood (Routine X 2) w Reflex to ID Panel     Status: None   Collection Time: 10/23/22 12:24 PM   Specimen: BLOOD  Result Value Ref Range Status   Specimen Description BLOOD BLOOD RIGHT FOREARM  Final   Special Requests   Final  BOTTLES DRAWN AEROBIC AND ANAEROBIC Blood Culture adequate volume   Culture   Final    NO GROWTH 5 DAYS Performed at [facility], [address], [place], [place] [address]    Report Status 10/28/2022 FINAL  Final  Blood Culture ID Panel (Reflexed)     Status: Abnormal   Collection Time: 10/23/22 12:24 PM  Result Value Ref Range Status   Enterococcus faecalis NOT DETECTED NOT DETECTED Final   Enterococcus Faecium NOT DETECTED NOT DETECTED Final   Listeria monocytogenes NOT DETECTED NOT DETECTED Final   Staphylococcus species DETECTED (A) NOT DETECTED Final    Comment: CRITICAL RESULT CALLED TO, READ BACK BY AND VERIFIED WITH: K HURTH,PHARMD@0354  10/27/22 MK    Staphylococcus aureus (BCID) DETECTED (A) NOT DETECTED Final    Comment: Methicillin (oxacillin)-resistant Staphylococcus aureus (MRSA). MRSA is predictably resistant to beta-lactam antibiotics (except ceftaroline). Preferred therapy is vancomycin unless clinically contraindicated. Patient requires contact precautions if  hospitalized. CRITICAL RESULT CALLED TO, READ BACK BY AND VERIFIED WITH: K HURTH,PHARMD@0354  10/27/22 MK    Staphylococcus epidermidis NOT DETECTED NOT DETECTED Final   Staphylococcus lugdunensis NOT DETECTED NOT DETECTED Final   Streptococcus species NOT DETECTED NOT DETECTED Final   Streptococcus agalactiae NOT DETECTED NOT DETECTED Final   Streptococcus pneumoniae NOT DETECTED NOT DETECTED Final   Streptococcus pyogenes NOT  DETECTED NOT DETECTED Final   A.calcoaceticus-baumannii NOT DETECTED NOT DETECTED Final   Bacteroides fragilis NOT DETECTED NOT DETECTED Final   Enterobacterales NOT DETECTED NOT DETECTED Final   Enterobacter cloacae complex NOT DETECTED NOT DETECTED Final   Escherichia coli NOT DETECTED NOT DETECTED Final   Klebsiella aerogenes NOT DETECTED NOT DETECTED Final   Klebsiella oxytoca NOT DETECTED NOT DETECTED Final   Klebsiella pneumoniae NOT DETECTED NOT DETECTED Final   Proteus species NOT DETECTED NOT DETECTED Final   Salmonella species NOT DETECTED NOT DETECTED Final   Serratia marcescens NOT DETECTED NOT DETECTED Final   Haemophilus influenzae NOT DETECTED NOT DETECTED Final   Neisseria meningitidis NOT DETECTED NOT DETECTED Final   Pseudomonas aeruginosa NOT DETECTED NOT DETECTED Final   Stenotrophomonas maltophilia NOT DETECTED NOT DETECTED Final   Candida albicans NOT DETECTED NOT DETECTED Final   Candida auris NOT DETECTED NOT DETECTED Final   Candida glabrata NOT DETECTED NOT DETECTED Final   Candida krusei NOT DETECTED NOT DETECTED Final   Candida parapsilosis NOT DETECTED NOT DETECTED Final   Candida tropicalis NOT DETECTED NOT DETECTED Final   Cryptococcus neoformans/gattii NOT DETECTED NOT DETECTED Final   Meth resistant mecA/C and MREJ DETECTED (A) NOT DETECTED Final    Comment: CRITICAL RESULT CALLED TO, READ BACK BY AND VERIFIED WITH: K HURTH,PHARMD@0354  10/27/22 MK Performed at [facility], [address], [place], [place] [address]   Aerobic/Anaerobic Culture w Gram Stain (surgical/deep wound)     Status: None (Preliminary result)   Collection Time: 10/25/22 12:35 PM   Specimen: Path fluid; Tissue  Result Value Ref Range Status   Specimen Description FLUID  Final   Special Requests infected bilateral femoral graft  Final   Gram Stain   Final    ABUNDANT WBC PRESENT, PREDOMINANTLY PMN ABUNDANT GRAM POSITIVE COCCI IN PAIRS IN CLUSTERS Performed at [facility], [address], [place], [place] [address]    Culture   Final    MODERATE STAPHYLOCOCCUS AUREUS NO ANAEROBES ISOLATED; CULTURE IN PROGRESS FOR 5 DAYS    Report Status PENDING  Incomplete   Organism ID,  Bacteria STAPHYLOCOCCUS AUREUS  Final      Susceptibility   Staphylococcus aureus - MIC*    CIPROFLOXACIN >=8 RESISTANT Resistant     ERYTHROMYCIN <=0.25 SENSITIVE Sensitive     GENTAMICIN <=0.5 SENSITIVE Sensitive     OXACILLIN >=4 RESISTANT Resistant     TETRACYCLINE <=1 SENSITIVE Sensitive     VANCOMYCIN <=0.5 SENSITIVE Sensitive     TRIMETH/SULFA <=10 SENSITIVE Sensitive     CLINDAMYCIN <=0.25 SENSITIVE Sensitive     RIFAMPIN <=0.5 SENSITIVE Sensitive     Inducible Clindamycin NEGATIVE Sensitive     LINEZOLID 2 SENSITIVE Sensitive     * MODERATE STAPHYLOCOCCUS AUREUS  Culture, blood (Routine X 2) w Reflex to ID Panel     Status: None (Preliminary result)   Collection Time: 10/27/22  2:46 PM   Specimen: BLOOD RIGHT HAND  Result Value Ref Range Status   Specimen Description BLOOD RIGHT HAND  Final   Special Requests   Final    BOTTLES DRAWN AEROBIC AND ANAEROBIC Blood Culture adequate volume   Culture  Setup Time   Final    GRAM POSITIVE COCCI AEROBIC BOTTLE ONLY CRITICAL VALUE NOTED.  VALUE IS CONSISTENT WITH PREVIOUSLY REPORTED AND CALLED VALUE.    Culture   Final    NO GROWTH 2 DAYS Performed at [facility], [address], [place], [place] [address]    Report Status PENDING  Incomplete  Culture, blood (Routine X 2) w Reflex to ID Panel     Status: None (Preliminary result)   Collection Time: 10/27/22  2:47 PM   Specimen: BLOOD RIGHT ARM  Result Value Ref Range Status   Specimen Description BLOOD RIGHT ARM  Final   Special Requests   Final    BOTTLES DRAWN AEROBIC AND ANAEROBIC Blood Culture adequate volume   Culture   Final    NO GROWTH 2 DAYS Performed at [facility], [address] [place], [place] [address]    Report Status PENDING   Incomplete     Marcos Eke, NP [facility]  10/29/2022  1:53 PM

## 2022-10-29 NOTE — Progress Notes (Signed)
PT Cancellation Note  Patient Details Name: Kimmie Ando MRN: [id number] DOB: 07/01/1932   Cancelled Treatment:    Reason Eval/Treat Not Completed: Active bedrest order; will attempt another day.   [name] 10/29/2022, 1:48 PM Sheran Lawless, PT Acute Rehabilitation Services Office:[phone] 10/29/2022

## 2022-10-30 DIAGNOSIS — I714 Abdominal aortic aneurysm, without rupture, unspecified: Secondary | ICD-10-CM | POA: Diagnosis not present

## 2022-10-30 DIAGNOSIS — T827XXD Infection and inflammatory reaction due to other cardiac and vascular devices, implants and grafts, subsequent encounter: Secondary | ICD-10-CM | POA: Diagnosis not present

## 2022-10-30 DIAGNOSIS — E876 Hypokalemia: Secondary | ICD-10-CM | POA: Diagnosis not present

## 2022-10-30 DIAGNOSIS — I739 Peripheral vascular disease, unspecified: Secondary | ICD-10-CM | POA: Diagnosis not present

## 2022-10-30 DIAGNOSIS — Z515 Encounter for palliative care: Secondary | ICD-10-CM | POA: Diagnosis not present

## 2022-10-30 DIAGNOSIS — A4102 Sepsis due to Methicillin resistant Staphylococcus aureus: Secondary | ICD-10-CM | POA: Diagnosis not present

## 2022-10-30 LAB — CBC WITH DIFFERENTIAL/PLATELET
Abs Immature Granulocytes: 0.79 10*3/uL — ABNORMAL HIGH (ref 0.00–0.07)
Basophils Absolute: 0.1 10*3/uL (ref 0.0–0.1)
Basophils Relative: 1 %
Eosinophils Absolute: 0.1 10*3/uL (ref 0.0–0.5)
Eosinophils Relative: 1 %
HCT: 22.6 % — ABNORMAL LOW (ref 39.0–52.0)
Hemoglobin: 8.1 g/dL — ABNORMAL LOW (ref 13.0–17.0)
Immature Granulocytes: 7 %
Lymphocytes Relative: 8 %
Lymphs Abs: 0.9 10*3/uL (ref 0.7–4.0)
MCH: 30.3 pg (ref 26.0–34.0)
MCHC: 35.8 g/dL (ref 30.0–36.0)
MCV: 84.6 fL (ref 80.0–100.0)
Monocytes Absolute: 0.8 10*3/uL (ref 0.1–1.0)
Monocytes Relative: 7 %
Neutro Abs: 8.9 10*3/uL — ABNORMAL HIGH (ref 1.7–7.7)
Neutrophils Relative %: 76 %
Platelets: 312 10*3/uL (ref 150–400)
RBC: 2.67 MIL/uL — ABNORMAL LOW (ref 4.22–5.81)
RDW: 16.7 % — ABNORMAL HIGH (ref 11.5–15.5)
WBC: 11.4 10*3/uL — ABNORMAL HIGH (ref 4.0–10.5)
nRBC: 0 % (ref 0.0–0.2)

## 2022-10-30 LAB — BASIC METABOLIC PANEL
Anion gap: 11 (ref 5–15)
BUN: 13 mg/dL (ref 8–23)
CO2: 19 mmol/L — ABNORMAL LOW (ref 22–32)
Calcium: 7.4 mg/dL — ABNORMAL LOW (ref 8.9–10.3)
Chloride: 96 mmol/L — ABNORMAL LOW (ref 98–111)
Creatinine, Ser: 0.86 mg/dL (ref 0.61–1.24)
GFR, Estimated: >60 mL/min (ref >60)
Glucose, Bld: 82 mg/dL (ref 70–99)
Potassium: 4.5 mmol/L (ref 3.5–5.1)
Sodium: 126 mmol/L — ABNORMAL LOW (ref 135–145)

## 2022-10-30 LAB — AEROBIC/ANAEROBIC CULTURE W GRAM STAIN (SURGICAL/DEEP WOUND)

## 2022-10-30 LAB — HEMOGLOBIN AND HEMATOCRIT, BLOOD
HCT: 24.3 % — ABNORMAL LOW (ref 39.0–52.0)
Hemoglobin: 8.3 g/dL — ABNORMAL LOW (ref 13.0–17.0)

## 2022-10-30 MED ORDER — GUAIFENESIN ER 600 MG PO TB12
1200.0000 mg | ORAL_TABLET | Freq: Two times a day (BID) | ORAL | Status: DC
  Administered 2022-10-30 – 2022-11-12 (×14): 1200 mg via ORAL
  Filled 2022-10-30 (×24): qty 2

## 2022-10-30 MED ORDER — FLUTICASONE PROPIONATE 50 MCG/ACT NA SUSP
2.0000 | Freq: Every day | NASAL | Status: DC
  Administered 2022-10-31 – 2022-11-12 (×10): 2 via NASAL
  Filled 2022-10-30: qty 16

## 2022-10-30 NOTE — Progress Notes (Signed)
PT Cancellation Note  Patient Details Name: Edward Campos MRN: [id number] DOB: 08-18-1932   Cancelled Treatment:    Reason Eval/Treat Not Completed: (P) Medical issues which prohibited therapy (Pt with active bedrest order. Will continue to follow.)   [name] 10/30/2022, 9:21 AM

## 2022-10-30 NOTE — TOC Progression Note (Signed)
Transition of Care [facility]) - Progression Note    Patient Details  Name: Edward Campos MRN: [id number] Date of Birth: 30-May-1932  [facility] CM/SW Contact  Baldemar Len[place], [place] Phone Number: 10/30/2022, 2:41 PM  Clinical Narrative:   CSW updated that patient will need SNF for wound care and IV antibiotics, has no assistance at home. Patient in agreement with SNF. CSW completed referral and faxed out, awaiting bed offers. CSW to follow.    Expected Discharge Plan: Skilled Nursing Facility Barriers to Discharge: Continued Medical Work up  Expected Discharge Plan and Services   Discharge Planning Services: CM Consult Post Acute Care Choice: Home Health, Resumption of Svcs/PTA Provider, Skilled Nursing Facility Living arrangements for the past 2 months: Single Family Home                           HH Arranged: PT, OT, Nurse's Aide, RN, Social Work [employer] Agency: [facility] ([facility]) Date HH Agency Contacted: 10/26/22 Time HH Agency Contacted: 1458 Representative spoke with at [facility] Agency: Morrie Sheldon   Social Determinants of Health (SDOH) Interventions SDOH Screenings   Food Insecurity: No Food Insecurity (10/25/2022)  Housing: Low Risk  (10/25/2022)  Transportation Needs: No Transportation Needs (10/25/2022)  Utilities: Not At Risk (10/25/2022)  Tobacco Use: Medium Risk (10/25/2022)    Readmission Risk Interventions     No data to display

## 2022-10-30 NOTE — Progress Notes (Addendum)
[facility] [place]  Subjective  - No new concerns this am from patient   Objective 130/77 88 97.8 F (36.6 C) (Oral) 19 95%  Intake/Output Summary (Last 24 hours) at 10/30/2022 0703 Last data filed at 10/30/2022 0600 Gross per 24 hour  Intake 1449.41 ml  Output 2180 ml  Net -730.59 ml    Drain OP 105, vacs with good suction Firmness right medical groin with ecchymosis, small hematoma, NTTP Doppler signals maintained B DP Lungs non labored breathing  Assessment/Planning: Procedure: 1.  Redo exposure of bilateral common femoral arteries greater than 30 days 2.  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  Redo right to left femoral-femoral bypass graft with cadaveric great saphenous vein 4.  Left common femoral to left SFA interposition bypass with cadaveric great saphenous vein 5.  Myriad morcells bilateral groin wounds 6.  Bilateral Prevena wound VAC placement   -lost seal on left groin will exchange canister and seal left groin sponge to suction.   HGB decreased from 8.5 to 8.1, asymptomatic Right groin with small hematoma, non tender, but firm  Patent bypass with DP signals CTA didn't demonstrate active bleeding or pseudo DR. Karin Lieu will review and exam the patient today with recommendations.  Edward Campos 10/30/2022 7:03 AM --  VASCULAR STAFF ADDENDUM: I have independently interviewed and examined the patient. I agree with the above.  Patient seen and examined this morning, left VAC not to seal.  Both vacs removed.  Small hematoma right groin, no larger than yesterday evening, however puffiness in the left groin with some drainage, this appears to be clear.  I placed a dry dressings on the wounds, and reassessed Deno several hours later.  There was some serosanguineous drainage, there was mostly serous.  I think that he has a sizable seroma in the left groin, which is tracking over to the right groin.  The  inferior portion of the right groin is nice and soft.  There is some hematoma in the superior portion.  I think that the graft remains intact, however with the amount of serous drainage she is having, I do not think that another vacuum dressing placement will be sufficient.  I had a long discussion with both Rochell and his daughter regarding reopening the groins, seroma washout, muscle flaps, vacuum-assisted dressing with wound healing via secondary intention.  He is aware that should wean in the groin, and there is anastomotic disruption, this would result in femorofemoral ligation, oversewing of the profundus stump and superficial femoral artery stump, likely left-sided above-knee amputation that may or may not heal.  In short, should his femoral-femoral bypass fail, this would likely be a mortal event as I do not think he has the blood flow necessary to heal a left AKA stump, with possible need for right-sided amputation as well.  Please make n.p.o. midnight, bilateral groin washout, muscle flaps for seroma, hematoma washout  Fara Olden, MD [facility] Phone Number: [phone] 10/30/2022 12:42 PM    Laboratory Lab Results: Recent Labs    10/29/22 1133 10/30/22 0327  WBC 12.6* 11.4*  HGB 8.3* 8.1*  HCT 24.5* 22.6*  PLT 359 312   BMET Recent Labs    10/29/22 1133 10/30/22 0327  NA 126* 126*  K 3.8 4.5  CL 91* 96*  CO2 24 19*  GLUCOSE 65* 82  BUN 12 13  CREATININE 0.81 0.86  CALCIUM 7.5* 7.4*  COAG Lab Results  Component Value Date   INR 1.2 08/16/2022   INR 1.1 06/04/2019   No results found for: "PTT"

## 2022-10-30 NOTE — Progress Notes (Signed)
Palliative Medicine Inpatient Follow Up Note  HPI: 89 y.o. male   admitted on 10/22/2022 with history of a right to left femorofemoral bypass with common and external iliac artery stenting on the right with a known abdominal aortic aneurysm.  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 Palliative care support given patient acute on chronic disease burden.   Today's Discussion 10/30/2022  *Please note that this is a verbal dictation therefore any spelling or grammatical errors are due to the "[employer]agon Medical One" system interpretation.  Chart reviewed inclusive of vital signs, progress notes, laboratory results, and diagnostic images.  A family meeting was held this morning with Edward Campos and his daughters, Edward Campos and Edward Campos.   Edward Campos shares that the vascular surgery team want to operate on him again and he expresses concern as they further reviewed the idea of limb loss. He shares very clearly that he does not want an amputation.  We discussed getting the surgery versus not getting the surgery. We reviewed the pro's and con's per his understanding.  We discussed the option of hospice care as opposed to aggressive interventions should he continue to deteriorate which would be a likely expectation with no additional intervention(s).  Code status was reviewed in great detail. Edward Campos feels strongly that he wants to continue full code/ full scope of care. He feels that he does not want another surgery and does not want to lose a leg. He goes on to say that he would like to complete his six week course of antibiotics. He wants to go to rehab and if he should decline and things not go favorably two weeks in he would be more open to hospice conversations.   Patients daughter met me outside of the room and shared that Edward Campos is in great denial regarding his present state. In addition she plans to find his advanced directives so that we may have these should be decline further.    Questions and concerns addressed/Palliative Support Provided.   Objective Assessment: Vital Signs Vitals:   10/30/22 0330 10/30/22 0816  BP: 130/77 131/74  Pulse: 88 88  Resp: 19 20  Temp: 97.8 F (36.6 C) 97.7 F (36.5 C)  SpO2: 95% 96%    Intake/Output Summary (Last 24 hours) at 10/30/2022 1121 Last data filed at 10/30/2022 0600 Gross per 24 hour  Intake 1209.41 ml  Output 1680 ml  Net -470.59 ml   Last Weight  Most recent update: 10/25/2022  6:53 AM    Weight  75.9 kg (167 lb 5.3 oz)            Gen:  Elderly Caucasian M in NAD HEENT: moist mucous membranes CV: Regular rate and rhythm  PULM: On RA, breathing is even and nonlabored ABD: soft/nontender  EXT: No edema  Neuro: Alert and oriented x3   SUMMARY OF RECOMMENDATIONS   Full Code/ Full Scope of Care -> re-confirmed  Edward Campos does not want to lose a leg nor does he want additional surgeries at this time, he is aware of the risk/benefits of each choice  Edward Campos is hopeful to complete antibiotics and go to skilled nursing  Goals are for ongoing improvement/recovery   Ongoing PMT support  Billing based on MDM: High ______________________________________________________________________________________ Edward Campos Palliative Medicine Team Team Cell Phone: [phone] Please utilize secure chat with additional questions, if there is no response within 30 minutes please call the above phone number  Palliative Medicine Team providers are available by phone from 7am  to 7pm daily and can be reached through the team cell phone.  Should this patient require assistance outside of these hours, please call the patient's attending physician.

## 2022-10-30 NOTE — [place]2 (Signed)
[place] MEDICAID FL2 LEVEL OF CARE FORM     IDENTIFICATION  Patient Name: Edward Campos Birthdate: 05-09-1932 Sex: male Admission Date (Current Location): 10/22/2022  [facility] and [place] Number:  [employer] and Address:  [facility], [address], [place], [place] [address]      Provider Number: [id number]  Attending Physician Name and Address:  Rodolph Bong, MD  Relative Name and Phone Number:       Current Level of Care: Hospital Recommended Level of Care: Skilled Nursing Facility Prior Approval Number:    Date Approved/Denied:   PASRR Number: [id number]  Discharge Plan: SNF    Current Diagnoses: Patient Active Problem List   Diagnosis Date Noted   MRSA bacteremia 10/27/2022   Sepsis due to methicillin resistant Staphylococcus aureus (MRSA) (HCC) 10/27/2022   Sepsis (HCC) 10/24/2022   Hypokalemia 10/24/2022   Transaminitis 10/24/2022   Hyponatremia 10/24/2022   AAA (abdominal aortic aneurysm) (HCC) 10/24/2022   PAD (peripheral artery disease) (HCC) 10/24/2022   Infection 10/22/2022   Shock (HCC) 09/25/2022   Pneumonia 09/06/2022   Small cell lung cancer, left lower lobe (HCC) 08/30/2022   Mass of left lung 08/21/2022   Infection of vascular bypass graft (HCC) 08/16/2022   Typical atrial flutter (HCC)    Non-healing left groin open wound, initial encounter 07/06/2019   Status post insertion of iliac artery stent 06/08/2019   Peripheral vascular disease (HCC) 06/08/2019    Orientation RESPIRATION BLADDER Height & Weight     Self, Time, Situation, Place  Normal Continent Weight: 167 lb 5.3 oz (75.9 kg) Height:  5\' 9"  (175.3 cm)  BEHAVIORAL SYMPTOMS/MOOD NEUROLOGICAL BOWEL NUTRITION STATUS      Continent Diet (heart healthy)  AMBULATORY STATUS COMMUNICATION OF NEEDS Skin   Limited Assist Verbally Surgical wounds, Wound Vac (right and left groin, prevena wound vac)                       Personal Care  Assistance Level of Assistance  Bathing, Feeding, Dressing Bathing Assistance: Limited assistance Feeding assistance: Independent Dressing Assistance: Limited assistance     Functional Limitations Info  Sight, Hearing Sight Info: Impaired Hearing Info: Impaired      SPECIAL CARE FACTORS FREQUENCY  PT (By licensed PT), OT (By licensed OT)     PT Frequency: 5x/wk OT Frequency: 5x/wk            Contractures Contractures Info: Not present    Additional Factors Info  Code Status, Allergies, Isolation Precautions Code Status Info: Full Allergies Info: Penicillins     Isolation Precautions Info: Contact precautions: MRSA     Current Medications (10/30/2022):  This is the current hospital active medication list Current Facility-Administered Medications  Medication Dose Route Frequency Provider Last Rate Last Admin   0.9 %  sodium chloride infusion  250 mL Intravenous PRN Baglia, Corrina, PA-C   New Bag at 10/25/22 1215   0.9 %  sodium chloride infusion  500 mL Intravenous Once PRN Baglia, Corrina, PA-C       0.9 %  sodium chloride infusion   Intravenous Continuous Rodolph Bong, MD 100 mL/hr at 10/30/22 0942 New Bag at 10/30/22 0942   acetaminophen (TYLENOL) tablet 650 mg  650 mg Oral Q6H PRN Baglia, Corrina, PA-C   650 mg at 10/26/22 2131   alum & mag hydroxide-simeth (MAALOX/MYLANTA) 200-200-20 MG/5ML suspension 15-30 mL  15-30 mL Oral Q2H PRN Baglia, Corrina, PA-C  aspirin EC tablet 81 mg  81 mg Oral Daily Baglia, Corrina, PA-C   81 mg at 10/30/22 0929   bisacodyl (DULCOLAX) EC tablet 5 mg  5 mg Oral Daily PRN Baglia, Corrina, PA-C       chlorhexidine (PERIDEX) 0.12 % solution 15 mL  15 mL Mouth/Throat BID Rodolph Bong, MD   15 mL at 10/29/22 1000   clopidogrel (PLAVIX) tablet 75 mg  75 mg Oral Daily Clinton Gallant M, PA-C   75 mg at 10/30/22 4098   DAPTOmycin (CUBICIN) 600 mg in sodium chloride 0.9 % IVPB  8 mg/kg Intravenous Q1400 Blanchard Kelch, NP 124  mL/hr at 10/30/22 1421 600 mg at 10/30/22 1421   diphenhydrAMINE (BENADRYL) capsule 25 mg  25 mg Oral QHS PRN Baglia, Corrina, PA-C       docusate sodium (COLACE) capsule 100 mg  100 mg Oral Daily Baglia, Corrina, PA-C   100 mg at 10/28/22 0927   fluticasone (FLONASE) 50 MCG/ACT nasal spray 2 spray  2 spray Each Nare Daily Rodolph Bong, MD       guaiFENesin (MUCINEX) 12 hr tablet 1,200 mg  1,200 mg Oral BID Rodolph Bong, MD   1,200 mg at 10/30/22 0929   guaiFENesin-dextromethorphan (ROBITUSSIN DM) 100-10 MG/5ML syrup 15 mL  15 mL Oral Q4H PRN Baglia, Corrina, PA-C       heparin injection 5,000 Units  5,000 Units Subcutaneous Q8H Baglia, Corrina, PA-C   5,000 Units at 10/30/22 1323   hydrALAZINE (APRESOLINE) injection 5 mg  5 mg Intravenous Q20 Min PRN Baglia, Corrina, PA-C       HYDROmorphone (DILAUDID) injection 0.5-1 mg  0.5-1 mg Intravenous Q2H PRN Baglia, Corrina, PA-C       labetalol (NORMODYNE) injection 10 mg  10 mg Intravenous Q10 min PRN Baglia, Corrina, PA-C       lactated ringers infusion   Intravenous Continuous Baglia, Corrina, PA-C       loperamide (IMODIUM) capsule 2 mg  2 mg Oral BID PRN Baglia, Corrina, PA-C       metoprolol tartrate (LOPRESSOR) injection 2-5 mg  2-5 mg Intravenous Q2H PRN Baglia, Corrina, PA-C       multivitamin with minerals tablet 1 tablet  1 tablet Oral Daily Baglia, Corrina, PA-C   1 tablet at 10/30/22 0929   ondansetron (ZOFRAN) injection 4 mg  4 mg Intravenous Q6H PRN Baglia, Corrina, PA-C   4 mg at 10/25/22 1302   oxyCODONE (Oxy IR/ROXICODONE) immediate release tablet 5-10 mg  5-10 mg Oral Q4H PRN Baglia, Corrina, PA-C       pantoprazole (PROTONIX) EC tablet 40 mg  40 mg Oral Daily Baglia, Corrina, PA-C   40 mg at 10/30/22 0929   phenol (CHLORASEPTIC) mouth spray 1 spray  1 spray Mouth/Throat PRN Baglia, Corrina, PA-C       phosphorus (K PHOS NEUTRAL) tablet 250 mg  250 mg Oral BID Rodolph Bong, MD   250 mg at 10/30/22 1191   potassium  chloride SA (KLOR-CON M) CR tablet 20-40 mEq  20-40 mEq Oral Once PRN Baglia, Corrina, PA-C       senna-docusate (Senokot-S) tablet 1 tablet  1 tablet Oral QHS PRN Baglia, Corrina, PA-C       sodium chloride flush (NS) 0.9 % injection 3 mL  3 mL Intravenous Q12H Baglia, Corrina, PA-C   3 mL at 10/29/22 2111   sodium chloride flush (NS) 0.9 % injection 3 mL  3 mL Intravenous PRN  Graceann Congress, PA-C         Discharge Medications: Please see discharge summary for a list of discharge medications.  Relevant Imaging Results:  Relevant Lab Results:   Additional Information SS#: [id number]; Will need 6 weeks of daptomycin  [place], [place]

## 2022-10-30 NOTE — Progress Notes (Signed)
PROGRESS NOTE    Minus Brigner  MWU:[id number] DOB: 1932-12-23 DOA: 10/22/2022 PCP: Patient, No Pcp Per   No chief complaint on file.   Brief Narrative:  Edward Campos is a 89 y.o. male with past medical history of HLD, PVD s/p bypass grafting, AAA, SCLC who presented as a direct admission to the vascular service for concern a infected femoral bypass.  Patient had been hospitalized in June for infected right to left femoral to femoral bypass graft with temporary wound VAC placement.  Over the last week he had noticed  increased swelling, pain, bloody/possible pus drainage, reported fevers up to 101 F at home.  Records note he was advised to go to the emergency department for further evaluation, but ended up followed up in the office on 8/12.  Orders were placed to admit the patient directly to the hospital for need of removal of the graft.  Patient was noted to be afebrile with pulse 84-109, and all other vital signs relatively maintained.  Labs from yesterday significant for WBC 13.2, hemoglobin 11.5, sodium 122, calcium, and ALT 46.  MRSA PCR screen was positive.  CT angiogram of the abdomen pelvis noted right bifemoral bypass graft with fluid/inflammatory changes associated with the femorofemoral bypass most prominent on the left compatible with a graft infection, and a 5.8 infrarenal abdominal aortic aneurysm grossly unchanged in size.  Patient had been started on empiric antibiotics of vancomycin and cefepime.  He had been posted for graft excision today.  However, patient declined having graft removed this morning as he wanted time to think about his options.  He is amendable to having the graft removed at this time.  Vascular surgery asked [facility] to assume care as patient due to other comorbidities.   Patient also reports that he is continue to have a intermittently productive cough since admitted in the hospital back in July with a postobstructive pneumonia.  Denies coughing up any blood.  He has  not been able to start chemotherapy due to his wounds not being completely healed.   Assessment & Plan:   Principal Problem:   Sepsis due to methicillin resistant Staphylococcus aureus (MRSA) (HCC) Active Problems:   Sepsis (HCC)   Infection of vascular bypass graft (HCC)   Peripheral vascular disease (HCC)   Small cell lung cancer, left lower lobe (HCC)   Hypokalemia   Transaminitis   Hyponatremia   AAA (abdominal aortic aneurysm) (HCC)   PAD (peripheral artery disease) (HCC)   MRSA bacteremia  #1 sepsis secondary to infected left femoral graft MRSA, MRSA bacteremia, POA -Patient met criteria for sepsis on admission with tachycardia, leukocytosis, fevers of 101 Fahrenheit prior to admission. -MRSA screening noted to be positive. -CT angiogram abdomen and pelvis noted right bifemoral bypass graft with fluid/inflammatory changes associated with femoral-femoral bypass most prominent on the left compatible with graft infection. -Blood cultures and lactic acid levels obtained and blood cultures positive for MRSA.  -Patient noted to have received IV Vanco and cefepime prior to blood cultures being drawn. -Patient seen by vascular surgery who initially had planned on removing graft, patient noted to have initially declined needing more time to think about it and patient subsequently agreed for graft removal.   -Patient underwent excision of infected right to left femorofemoral bypass graft with redo right to left femorofemoral bypass graft with cadaveric great saphenous vein, left common femoral to left SFA interposition bypass with cadaveric great saphenous vein, redo exposure of bilateral common femoral arteries greater than 30 days,  my read more cells bilateral groin wounds, bilateral Prevena wound VAC placement.(10/25/2022) -Significant purulence noted in the left groin and the common femoral artery in the left groin significantly scarred in place and friable per op note findings.  Cultures  sent and positive for MRSA.    -Patient was on IV Vanco and IV cefepime.   -Patient seen in consultation by ID and antibiotics changed to IV daptomycin monotherapy.   -Due to positive blood cultures of MRSA, 2D echo was obtained with normal EF, NWMA, diastolic dysfunction, no vegetations noted. -Patient noted to have a right groin hematoma, CT abdomen and pelvis ordered per vascular surgery. -Repeat blood cultures ordered per ID and pending with no growth to date.. -ID recommending probable TEE to rule out endocarditis. -Cardiology informed and patient for probable TEE tomorrow 10/31/2022. -Patient noted to have lost seal on left groin, noted to have hemoglobin trickle down to 8.1 however remained asymptomatic and noted with a small hematoma. -CT angiogram abdomen and pelvis did not demonstrate active bleeding or pseudo DR. -Follow H&H. -ID following and appreciate input and recommendations.   -Per vascular surgery.    2.  Acute on chronic hyponatremia -Baseline sodium 128-131. -On initial presentation sodium noted at 122 with repeat at 126. -Patient with history of small cell lung cancer. -Sodium currently at 126 this morning. -Urine osmolality noted at 467, urine sodium of 89, urine creatinine of 79.  TSH within normal limits.  Cortisol within normal limits.  -Patient placed on gentle hydration with improving sodium levels.  -Monitor with IV fluids if no improvement or worsening may need to get nephrology input.   3.  Hypokalemia/hypophosphatemia -Potassium at 4.5 this morning.   -Repeat labs in the AM.   4.  Small cell lung cancer with bone metastases -Patient noted stage IV lung cancer with disease to the bone and prior postobstructive pneumonia. -CT scan done with branching masslike opacity measuring 5.2 x 7.9 cm in the left hilar region/central left lower lobe similar in size to prior. -Chemotherapy noted not to have been initiated due to wounds not being healed yet. -Palliative  care consulted for goals of care. -Outpatient follow-up with primary oncologist.  5.  PAD -Aspirin.   -Statin on hold due to transaminitis.  -It was initially thought patient was on Plavix prior to admission, however in reviewing med rec and in discussion with patient he was not chronically on Plavix but had been on Plavix during recent prior hospitalizations.   -Vascular surgery following.    6.  Transaminitis -??  Etiology. -CT angiogram abdomen and pelvis with no acute abnormalities noted in the left. -LFTs were trending up but trending back down with hydration.. -Acute hepatitis panel negative.   -Crestor discontinued.   -Follow.  7.  Hyperlipidemia -Crestor held secondary to rising LFTs.   -LFTs trending down.   -Likely resume Crestor on discharge.  8.  AAA -Patient with 5.8 x 5.7 cm infrarenal abdominal aortic aneurysm unchanged in size. -Vascular surgery following.     DVT prophylaxis: Heparin Code Status: Full Family Communication: Updated patient, daughter at bedside.  Disposition: SNF once medically stable and cleared by vascular surgery and ID.Marland Kitchen   Status is: Inpatient Remains inpatient appropriate because: Severity of illness   Consultants:  Palliative care: Lorinda Creed, NP 10/24/2022 [facility] hospitalist: Dr. Katrinka Blazing 10/23/2022 Vascular surgery ID:: Dr. Daiva Eves 10/26/2022  Procedures:  CT angiogram abdomen and pelvis 10/22/2022 1.  Redo exposure of bilateral common femoral arteries greater than 30 days  2.  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  Redo right to left femoral-femoral bypass graft with cadaveric great saphenous vein 4.  Left common femoral to left SFA interposition bypass with cadaveric great saphenous vein 5.  Myriad morcells bilateral groin wounds 6.  Bilateral Prevena wound VAC placement ----Per vascular surgery: Dr. Chestine Spore and Dr. Sherral Hammers 10/25/2022.  2D echo 10/27/2022 CT abdomen and pelvis  10/29/2022  Antimicrobials: Anti-infectives (From admission, onward)    Start     Dose/Rate Route Frequency Ordered Stop   10/26/22 0945  DAPTOmycin (CUBICIN) 600 mg in sodium chloride 0.9 % IVPB        8 mg/kg  75.9 kg 124 mL/hr over 30 Minutes Intravenous Daily 10/26/22 0855     10/26/22 0945  cefTRIAXone (ROCEPHIN) 2 g in sodium chloride 0.9 % 100 mL IVPB  Status:  Discontinued        2 g 200 mL/hr over 30 Minutes Intravenous Every 24 hours 10/26/22 0855 10/26/22 0905   10/25/22 1006  ceFAZolin 1 g / gentamicin 80 mg in NS 500 mL surgical irrigation  Status:  Discontinued          As needed 10/25/22 1006 10/25/22 1318   10/23/22 1145  vancomycin (VANCOREADY) IVPB 1250 mg/250 mL  Status:  Discontinued        1,250 mg 166.7 mL/hr over 90 Minutes Intravenous Every 24 hours 10/23/22 1051 10/26/22 0855   10/23/22 1100  ceFEPIme (MAXIPIME) 2 g in sodium chloride 0.9 % 100 mL IVPB  Status:  Discontinued        2 g 200 mL/hr over 30 Minutes Intravenous Every 12 hours 10/23/22 1041 10/26/22 0855         Subjective: Patient laying in bed.  Overall stating he is doing well.  Stated he was seen by vascular surgery early on this morning with bad news of possible amputation and they subsequently came back later on in the morning stating they could probably clean out infected areas possibly tomorrow which she was in agreement with.  Denies any chest pain or shortness of breath.  Still with some discomfort/pain in the right groin area.  Wound vacs off.  Daughter at bedside.   Objective: Vitals:   10/29/22 1929 10/29/22 2333 10/30/22 0330 10/30/22 0816  BP: 137/81 (!) 153/72 130/77 131/74  Pulse: (!) 103 91 88 88  Resp:  20 19 20   Temp: 98.3 F (36.8 C) 97.6 F (36.4 C) 97.8 F (36.6 C) 97.7 F (36.5 C)  TempSrc: Oral Oral Oral Oral  SpO2: 99% 91% 95% 96%  Weight:      Height:        Intake/Output Summary (Last 24 hours) at 10/30/2022 1215 Last data filed at 10/30/2022 0600 Gross per  24 hour  Intake 1209.41 ml  Output 1680 ml  Net -470.59 ml   Filed Weights   10/23/22 0431 10/25/22 0645 10/25/22 0651  Weight: 75.9 kg 75.9 kg 75.9 kg    Examination:  General exam: NAD. Respiratory system: CTAB anterior lung fields.  No wheezes, no crackles, no rhonchi.  Fair air movement.  Speaking in full sentences. Cardiovascular system: RRR no murmurs rubs or gallops.  No JVD.  No pitting lower extremity edema.  Gastrointestinal system: Abdomen is soft, nontender, nondistended, positive bowel sounds.  Bilateral wound vacs in groins have been removed and dressing noted over them.  Right groin hematoma.  Central nervous system: Alert and oriented. No focal neurological deficits. Extremities: Symmetric  5 x 5 power. Skin: No rashes, lesions or ulcers Psychiatry: Judgement and insight appear normal. Mood & affect appropriate.     Data Reviewed: I have personally reviewed following labs and imaging studies  CBC: Recent Labs  Lab 10/24/22 0940 10/25/22 0524 10/25/22 1001 10/26/22 0120 10/26/22 0748 10/27/22 0527 10/28/22 0230 10/29/22 0237 10/29/22 1133 10/30/22 0327  WBC 14.5* 10.6*   < > 10.6*   < > 13.3* 15.9* 13.5* 12.6* 11.4*  NEUTROABS 12.0* 8.7*  --  8.9*  --   --   --  12.6*  --  8.9*  HGB 12.6* 11.1*   < > 8.7*   < > 9.3* 9.3* 8.5* 8.3* 8.1*  HCT 35.3* 31.5*   < > 24.8*   < > 26.3* 26.0* 24.1* 24.5* 22.6*  MCV 83.1 82.7   < > 85.5   < > 83.2 85.0 85.5 87.5 84.6  PLT 278 254   < > 204   < > 263 319 321 359 312   < > = values in this interval not displayed.    Basic Metabolic Panel: Recent Labs  Lab 10/25/22 0524 10/25/22 1001 10/26/22 0120 10/26/22 0748 10/27/22 0527 10/28/22 0230 10/29/22 0237 10/29/22 1133 10/30/22 0327  NA 127*   < > 127*   < > 127* 126* 125* 126* 126*  K 4.3   < > 4.0   < > 3.6 3.3* 4.0 3.8 4.5  CL 96*  --  99   < > 96* 94* 94* 91* 96*  CO2 25  --  22   < > 24 21* 24 24 19*  GLUCOSE 118*  --  131*   < > 119* 123* 103* 65* 82   BUN 19  --  20   < > 12 10 12 12 13   CREATININE 0.86   < > 0.86   < > 0.92 0.74 0.82 0.81 0.86  CALCIUM 8.1*  --  7.5*   < > 7.6* 7.5* 7.6* 7.5* 7.4*  MG 1.8  --  1.7  --  1.7 1.9 1.8  --   --   PHOS  --   --   --   --  1.9* 2.4* 2.4*  --   --    < > = values in this interval not displayed.    GFR: Estimated Creatinine Clearance: 60.5 mL/min (by C-G formula based on SCr of 0.86 mg/dL).  Liver Function Tests: Recent Labs  Lab 10/25/22 0524 10/26/22 0120 10/27/22 0527 10/28/22 0230 10/29/22 0237  AST 110* 55* 46* 37 32  ALT 133* 87* 81* 64* 53*  ALKPHOS 82 48 68 73 65  BILITOT 0.9 1.0 0.9 1.2 1.1  PROT 5.4* 4.6* 5.4* 5.3* 5.1*  ALBUMIN 2.2* 2.2* 2.4* 2.3* 2.1*    CBG: No results for input(s): "GLUCAP" in the last 168 hours.   Recent Results (from the past 240 hour(s))  Surgical pcr screen     Status: Abnormal   Collection Time: 10/23/22  7:44 AM   Specimen: Nasal Mucosa; Nasal Swab  Result Value Ref Range Status   MRSA, PCR POSITIVE (A) NEGATIVE Final    Comment: RESULT CALLED TO, READ BACK BY AND VERIFIED WITH: RN MIA RINAL [phone] AT 1029 BY EC    Staphylococcus aureus POSITIVE (A) NEGATIVE Final    Comment: (NOTE) The Xpert SA Assay (FDA approved for NASAL specimens in patients 88 years of age and older), is one component of a comprehensive surveillance program. It is not  intended to diagnose infection nor to guide or monitor treatment. Performed at [facility], [address] [place], [place] [address]   Culture, blood (Routine X 2) w Reflex to ID Panel     Status: Abnormal (Preliminary result)   Collection Time: 10/23/22 12:24 PM   Specimen: BLOOD  Result Value Ref Range Status   Specimen Description BLOOD RIGHT ANTECUBITAL  Final   Special Requests Blood Culture adequate volume  Final   Culture  Setup Time   Final    GRAM POSITIVE COCCI ANAEROBIC BOTTLE ONLY CRITICAL RESULT CALLED TO, READ BACK BY AND VERIFIED WITH: K HURTH,PHARMD@0354  10/27/22  MK    Culture (A)  Final    METHICILLIN RESISTANT STAPHYLOCOCCUS AUREUS Sent to [facility] for further susceptibility testing. Performed at [facility], [address], [place], [place] [address]    Report Status PENDING  Incomplete   Organism ID, Bacteria METHICILLIN RESISTANT STAPHYLOCOCCUS AUREUS  Final      Susceptibility   Methicillin resistant staphylococcus aureus - MIC*    CIPROFLOXACIN >=8 RESISTANT Resistant     ERYTHROMYCIN <=0.25 SENSITIVE Sensitive     GENTAMICIN <=0.5 SENSITIVE Sensitive     OXACILLIN >=4 RESISTANT Resistant     TETRACYCLINE <=1 SENSITIVE Sensitive     VANCOMYCIN 1 SENSITIVE Sensitive     TRIMETH/SULFA <=10 SENSITIVE Sensitive     CLINDAMYCIN <=0.25 SENSITIVE Sensitive     RIFAMPIN <=0.5 SENSITIVE Sensitive     Inducible Clindamycin NEGATIVE Sensitive     LINEZOLID 2 SENSITIVE Sensitive     * METHICILLIN RESISTANT STAPHYLOCOCCUS AUREUS  Culture, blood (Routine X 2) w Reflex to ID Panel     Status: None   Collection Time: 10/23/22 12:24 PM   Specimen: BLOOD  Result Value Ref Range Status   Specimen Description BLOOD BLOOD RIGHT FOREARM  Final   Special Requests   Final    BOTTLES DRAWN AEROBIC AND ANAEROBIC Blood Culture adequate volume   Culture   Final    NO GROWTH 5 DAYS Performed at [facility], [address] [place], [place] [address]    Report Status 10/28/2022 FINAL  Final  Blood Culture ID Panel (Reflexed)     Status: Abnormal   Collection Time: 10/23/22 12:24 PM  Result Value Ref Range Status   Enterococcus faecalis NOT DETECTED NOT DETECTED Final   Enterococcus Faecium NOT DETECTED NOT DETECTED Final   Listeria monocytogenes NOT DETECTED NOT DETECTED Final   Staphylococcus species DETECTED (A) NOT DETECTED Final    Comment: CRITICAL RESULT CALLED TO, READ BACK BY AND VERIFIED WITH: K HURTH,PHARMD@0354  10/27/22 MK    Staphylococcus aureus (BCID) DETECTED (A) NOT DETECTED Final    Comment: Methicillin  (oxacillin)-resistant Staphylococcus aureus (MRSA). MRSA is predictably resistant to beta-lactam antibiotics (except ceftaroline). Preferred therapy is vancomycin unless clinically contraindicated. Patient requires contact precautions if  hospitalized. CRITICAL RESULT CALLED TO, READ BACK BY AND VERIFIED WITH: K HURTH,PHARMD@0354  10/27/22 MK    Staphylococcus epidermidis NOT DETECTED NOT DETECTED Final   Staphylococcus lugdunensis NOT DETECTED NOT DETECTED Final   Streptococcus species NOT DETECTED NOT DETECTED Final   Streptococcus agalactiae NOT DETECTED NOT DETECTED Final   Streptococcus pneumoniae NOT DETECTED NOT DETECTED Final   Streptococcus pyogenes NOT DETECTED NOT DETECTED Final   A.calcoaceticus-baumannii NOT DETECTED NOT DETECTED Final   Bacteroides fragilis NOT DETECTED NOT DETECTED Final   Enterobacterales NOT DETECTED NOT DETECTED Final   Enterobacter cloacae complex NOT DETECTED NOT DETECTED Final  Escherichia coli NOT DETECTED NOT DETECTED Final   Klebsiella aerogenes NOT DETECTED NOT DETECTED Final   Klebsiella oxytoca NOT DETECTED NOT DETECTED Final   Klebsiella pneumoniae NOT DETECTED NOT DETECTED Final   Proteus species NOT DETECTED NOT DETECTED Final   Salmonella species NOT DETECTED NOT DETECTED Final   Serratia marcescens NOT DETECTED NOT DETECTED Final   Haemophilus influenzae NOT DETECTED NOT DETECTED Final   Neisseria meningitidis NOT DETECTED NOT DETECTED Final   Pseudomonas aeruginosa NOT DETECTED NOT DETECTED Final   Stenotrophomonas maltophilia NOT DETECTED NOT DETECTED Final   Candida albicans NOT DETECTED NOT DETECTED Final   Candida auris NOT DETECTED NOT DETECTED Final   Candida glabrata NOT DETECTED NOT DETECTED Final   Candida krusei NOT DETECTED NOT DETECTED Final   Candida parapsilosis NOT DETECTED NOT DETECTED Final   Candida tropicalis NOT DETECTED NOT DETECTED Final   Cryptococcus neoformans/gattii NOT DETECTED NOT DETECTED Final   Meth  resistant mecA/C and MREJ DETECTED (A) NOT DETECTED Final    Comment: CRITICAL RESULT CALLED TO, READ BACK BY AND VERIFIED WITH: K HURTH,PHARMD@[email or web address]  10/27/22 MK Performed at [facility], [address] [place], [place] [address]   Aerobic/Anaerobic Culture w Gram Stain (surgical/deep wound)     Status: None (Preliminary result)   Collection Time: 10/25/22 12:35 PM   Specimen: Path fluid; Tissue  Result Value Ref Range Status   Specimen Description FLUID  Final   Special Requests infected bilateral femoral graft  Final   Gram Stain   Final    ABUNDANT WBC PRESENT, PREDOMINANTLY PMN ABUNDANT GRAM POSITIVE COCCI IN PAIRS IN CLUSTERS Performed at [facility], [address], [place], [place] [address]    Culture   Final    MODERATE STAPHYLOCOCCUS AUREUS NO ANAEROBES ISOLATED; CULTURE IN PROGRESS FOR 5 DAYS    Report Status PENDING  Incomplete   Organism ID, Bacteria STAPHYLOCOCCUS AUREUS  Final      Susceptibility   Staphylococcus aureus - MIC*    CIPROFLOXACIN >=8 RESISTANT Resistant     ERYTHROMYCIN <=0.25 SENSITIVE Sensitive     GENTAMICIN <=0.5 SENSITIVE Sensitive     OXACILLIN >=4 RESISTANT Resistant     TETRACYCLINE <=1 SENSITIVE Sensitive     VANCOMYCIN <=0.5 SENSITIVE Sensitive     TRIMETH/SULFA <=10 SENSITIVE Sensitive     CLINDAMYCIN <=0.25 SENSITIVE Sensitive     RIFAMPIN <=0.5 SENSITIVE Sensitive     Inducible Clindamycin NEGATIVE Sensitive     LINEZOLID 2 SENSITIVE Sensitive     * MODERATE STAPHYLOCOCCUS AUREUS  Culture, blood (Routine X 2) w Reflex to ID Panel     Status: Abnormal (Preliminary result)   Collection Time: 10/27/22  2:46 PM   Specimen: BLOOD RIGHT HAND  Result Value Ref Range Status   Specimen Description BLOOD RIGHT HAND  Final   Special Requests   Final    BOTTLES DRAWN AEROBIC AND ANAEROBIC Blood Culture adequate volume   Culture  Setup Time   Final    GRAM POSITIVE COCCI IN BOTH AEROBIC AND ANAEROBIC BOTTLES CRITICAL VALUE  NOTED.  VALUE IS CONSISTENT WITH PREVIOUSLY REPORTED AND CALLED VALUE.    Culture (A)  Final    STAPHYLOCOCCUS AUREUS SUSCEPTIBILITIES PERFORMED ON PREVIOUS CULTURE WITHIN THE LAST 5 DAYS. Performed at [facility], [address], [place], [place] [address]    Report Status PENDING  Incomplete  Culture, blood (Routine X 2) w Reflex to ID Panel     Status:  None (Preliminary result)   Collection Time: 10/27/22  2:47 PM   Specimen: BLOOD RIGHT ARM  Result Value Ref Range Status   Specimen Description BLOOD RIGHT ARM  Final   Special Requests   Final    BOTTLES DRAWN AEROBIC AND ANAEROBIC Blood Culture adequate volume   Culture   Final    NO GROWTH 3 DAYS Performed at [facility], [address], [place], [place] [address]    Report Status PENDING  Incomplete  Culture, blood (Routine X 2) w Reflex to ID Panel     Status: None (Preliminary result)   Collection Time: 10/29/22 11:33 AM   Specimen: BLOOD LEFT ARM  Result Value Ref Range Status   Specimen Description BLOOD LEFT ARM  Final   Special Requests   Final    BOTTLES DRAWN AEROBIC AND ANAEROBIC Blood Culture adequate volume   Culture   Final    NO GROWTH < 12 HOURS Performed at [facility], [address], [place], [place] [address]    Report Status PENDING  Incomplete  Culture, blood (Routine X 2) w Reflex to ID Panel     Status: None (Preliminary result)   Collection Time: 10/29/22 11:35 AM   Specimen: BLOOD  Result Value Ref Range Status   Specimen Description BLOOD RIGHT ANTECUBITAL  Final   Special Requests   Final    BOTTLES DRAWN AEROBIC AND ANAEROBIC Blood Culture adequate volume   Culture   Final    NO GROWTH < 12 HOURS Performed at [facility], [address], [place], [place] [address]    Report Status PENDING  Incomplete         Radiology Studies: CT Angio Abd/Pel w/ and/or w/o  Result Date: 10/30/2022 CLINICAL DATA:  Abdominal aortic aneurysm and blood loss status post  fem-fem bypass graft. EXAM: CTA ABDOMEN AND PELVIS WITHOUT AND WITH CONTRAST TECHNIQUE: Multidetector CT imaging of the abdomen and pelvis was performed using the standard protocol during bolus administration of intravenous contrast. Multiplanar reconstructed images and MIPs were obtained and reviewed to evaluate the vascular anatomy. RADIATION DOSE REDUCTION: This exam was performed according to the departmental dose-optimization program which includes automated exposure control, adjustment of the mA and/or kV according to patient size and/or use of iterative reconstruction technique. CONTRAST:  75 mL OMNIPAQUE IOHEXOL 350 MG/ML SOLN COMPARISON:  Recent prior CTA of the abdomen and pelvis 06/22/2022 FINDINGS: VASCULAR Aorta: Fusiform aneurysmal dilation of the infrarenal abdominal aorta measuring 5.9 x 6.0 cm (best measured on the coronal and sagittal reformatted images). This is essentially unchanged compared to recent prior imaging. Extensive atherosclerotic plaque. Celiac: Bulky calcified plaque at the origin of the celiac axis results in moderate to high-grade stenosis. SMA: Patent without evidence of aneurysm, dissection, vasculitis or significant stenosis. Renals: Single left renal artery. Dual right-sided renal arteries. Calcified plaque results in moderate stenosis at the origin of the left renal artery. Mild to moderate stenosis at the origin of the superior right renal artery. Focal high-grade stenosis at the origin of the inferior right renal artery. IMA: Likely occluded at the origin. Inflow: Calcified atherosclerotic plaque throughout the common iliac arteries. High tortuosity of the left common iliac artery. Chronic occlusion of the right internal iliac artery. Mild aneurysmal dilation of the left internal iliac artery up to 1.3 cm. 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 Patent stent system in the right external iliac artery. The chronic left iliac artery occlusion extends into  the  common femoral artery. Proximal Outflow: Interval revision fem-fem bypass graft. The previous scattered graft has been removed and replaced by a smaller 4 mm right to left fem-fem bypass graft. Mildly irregular and patulous anastomosis at the distal left common femoral artery. New more proximal occlusion of the right superficial femoral artery beginning almost immediately distal to the bypass graft. Hematoma surrounds the bypass graft and also extends into the left upper thigh and along the right inguinal cutdown. No evidence of active extravasation. Veins: No focal venous abnormality. Review of the MIP images confirms the above findings. NON-VASCULAR Lower chest: Extensive mucoid impaction of the visible left lower lobe bronchi. Small left and trace right pleural effusions. Centrilobular pulmonary emphysema. Hepatobiliary: Normal hepatic contour morphology. No discrete hepatic lesion. Small stones present in the gallbladder lumen. Pancreas: Unremarkable. No pancreatic ductal dilatation or surrounding inflammatory changes. Spleen: No splenic injury or perisplenic hematoma. Adrenals/Urinary Tract: Normal adrenal glands. No hydronephrosis, nephrolithiasis or enhancing renal mass. Unremarkable bladder. Stomach/Bowel: No focal bowel wall thickening or evidence of obstruction. Lymphatic: No suspicious lymphadenopathy. Reproductive: Prostatomegaly. Other: Surgical changes of revision fem-fem bypass graft with extensive hematoma tracking along the graft, in the right inguinal region and extending down the superficial fat of the left upper thigh. Musculoskeletal: No acute fracture or aggressive appearing lytic or blastic osseous lesion. IMPRESSION: 1. Interval revision fem-fem bypass graft. The previous graft has been resected and there is a new smaller 4 mm graft which is widely patent. Similar appearance of irregular patulous common femoral arteries at the anastomoses bilaterally but no evidence of active extravasation.  2. Hematoma extends along the tunnel course of the graft as well as within the right inguinal cutdown bed and left inguinal cutdown bed with additional hematoma extending in the superficial subcutaneous fat along the anterior upper thigh. No evidence of active bleeding or obvious arterial injury/pseudoaneurysm. 3. Stable appearance of significant fusiform infrarenal abdominal aortic aneurysm measuring up to 6 cm (unchanged compared to recent prior imaging on my measurements). Recommend continued care by vascular surgery. This recommendation follows ACR consensus guidelines: White Paper of the ACR Incidental Findings Committee II on Vascular Findings. J Am Coll Radiol 2013; 10:789-794. 4. Extensive mucoid impaction within the visualized left lower lobe bronchi. 5. Small left and trace right pleural effusions. 6. Centrilobular pulmonary emphysema. 7. Additional ancillary findings as above. Electronically Signed   By: Malachy Moan M.D.   On: 10/30/2022 07:21        Scheduled Meds:  aspirin EC  81 mg Oral Daily   chlorhexidine  15 mL Mouth/Throat BID   clopidogrel  75 mg Oral Daily   docusate sodium  100 mg Oral Daily   fluticasone  2 spray Each Nare Daily   guaiFENesin  1,200 mg Oral BID   heparin  5,000 Units Subcutaneous Q8H   multivitamin with minerals  1 tablet Oral Daily   pantoprazole  40 mg Oral Daily   phosphorus  250 mg Oral BID   sodium chloride flush  3 mL Intravenous Q12H   Continuous Infusions:  sodium chloride     sodium chloride     sodium chloride 100 mL/hr at 10/30/22 0942   DAPTOmycin (CUBICIN) 600 mg in sodium chloride 0.9 % IVPB 600 mg (10/29/22 1403)   lactated ringers       LOS: 8 days    Time spent: 40 minutes    Ramiro Harvest, MD [facility]   To contact the attending provider between 7A-7P or the  covering provider during after hours 7P-7A, please log into the web site [email or web address] and access using universal  password for that web  site. If you do not have the password, please call the hospital operator.  10/30/2022, 12:15 PM

## 2022-10-30 NOTE — Progress Notes (Signed)
[facility]  Date of Admission:  10/22/2022     Total days of antibiotics 8         ASSESSMENT:  Edward Campos blood culture on 10/27/22 has turned positive for Staphylococcus aureus in 1 set of bottles and continues to have no growth in the second set as well cultures drawn on 10/29/22. Could indicate persistent bacteremia although did have surgery prior.  Will continue to monitor cultures from 10/29/22 closely and continue current dose of daptomycin. Continue post-operative wound care per Vascular Surgery. Will need at least 6 weeks of antibiotics and does not require suppression post-treatment as the grafts have been completely excised. Hold central line placement pending clearance of blood cultures. Plan of care discussed with Edward Campos and his daughter at beside. Remaining medical and supportive care per Internal Medicine.   PLAN:  Continue current dose of daptomycin.  Monitor cultures for clearance of bacteremia. Hold central line placement pending clearance of blood cultures/bacteremia. Therapeutic drug monitoring of CK levels per protocol.  Post-operative wound care and Vac management per Vascular Surgery. Remaining medical and supportive care per Internal Medicine.    I have personally spent 30 minutes involved in face-to-face and non-face-to-face activities for this patient on the day of the visit. Professional time spent includes the following activities: Preparing to see the patient (review of tests), Obtaining and/or reviewing separately obtained history (admission/discharge record), Performing a medically appropriate examination and/or evaluation , Ordering medications/tests/procedures, referring and communicating with other health care professionals, Documenting clinical information in the EMR, Independently interpreting results (not separately reported), Communicating results to the patient/family/caregiver, Counseling and educating the  patient/family/caregiver and Care coordination (not separately reported).   Principal Problem:   Sepsis due to methicillin resistant Staphylococcus aureus (MRSA) (HCC) Active Problems:   Peripheral vascular disease (HCC)   Infection of vascular bypass graft (HCC)   Small cell lung cancer, left lower lobe (HCC)   Sepsis (HCC)   Hypokalemia   Transaminitis   Hyponatremia   AAA (abdominal aortic aneurysm) (HCC)   PAD (peripheral artery disease) (HCC)   MRSA bacteremia    aspirin EC  81 mg Oral Daily   chlorhexidine  15 mL Mouth/Throat BID   clopidogrel  75 mg Oral Daily   docusate sodium  100 mg Oral Daily   fluticasone  2 spray Each Nare Daily   guaiFENesin  1,200 mg Oral BID   heparin  5,000 Units Subcutaneous Q8H   multivitamin with minerals  1 tablet Oral Daily   pantoprazole  40 mg Oral Daily   phosphorus  250 mg Oral BID   sodium chloride flush  3 mL Intravenous Q12H    SUBJECTIVE:  Afebrile overnight with no acute events.   Allergies  Allergen Reactions   Penicillins Other (See Comments)     Review of Systems: Review of Systems  Constitutional:  Negative for chills, fever and weight loss.  Respiratory:  Negative for cough, shortness of breath and wheezing.   Cardiovascular:  Negative for chest pain and leg swelling.  Gastrointestinal:  Negative for abdominal pain, constipation, diarrhea, nausea and vomiting.  Skin:  Negative for rash.      OBJECTIVE: Vitals:   10/29/22 1929 10/29/22 2333 10/30/22 0330 10/30/22 0816  BP: 137/81 (!) 153/72 130/77 131/74  Pulse: (!) 103 91 88 88  Resp:  20 19 20   Temp: 98.3 F (36.8 C) 97.6 F (36.4 C) 97.8 F (36.6 C) 97.7 F (36.5 C)  TempSrc: Oral  Oral Oral Oral  SpO2: 99% 91% 95% 96%  Weight:      Height:       Body mass index is 24.71 kg/m.  Physical Exam Constitutional:      General: He is not in acute distress.    Appearance: He is well-developed.     Comments: Lying in bed with head of bed elevated;  pleasant; hard of hearing.  Cardiovascular:     Rate and Rhythm: Normal rate and regular rhythm.     Heart sounds: Normal heart sounds.     Comments: Wound vacs with blood drainage.  Pulmonary:     Effort: Pulmonary effort is normal.     Breath sounds: Normal breath sounds.  Skin:    General: Skin is warm and dry.  Neurological:     Mental Status: He is alert and oriented to person, place, and time.     Lab Results Lab Results  Component Value Date   WBC 11.4 (H) 10/30/2022   HGB 8.1 (L) 10/30/2022   HCT 22.6 (L) 10/30/2022   MCV 84.6 10/30/2022   PLT 312 10/30/2022    Lab Results  Component Value Date   CREATININE 0.86 10/30/2022   BUN 13 10/30/2022   NA 126 (L) 10/30/2022   K 4.5 10/30/2022   CL 96 (L) 10/30/2022   CO2 19 (L) 10/30/2022    Lab Results  Component Value Date   ALT 53 (H) 10/29/2022   AST 32 10/29/2022   ALKPHOS 65 10/29/2022   BILITOT 1.1 10/29/2022     Microbiology: Recent Results (from the past 240 hour(s))  Surgical pcr screen     Status: Abnormal   Collection Time: 10/23/22  7:44 AM   Specimen: Nasal Mucosa; Nasal Swab  Result Value Ref Range Status   MRSA, PCR POSITIVE (A) NEGATIVE Final    Comment: RESULT CALLED TO, READ BACK BY AND VERIFIED WITH: RN MIA RINAL [id number] AT 1029 BY EC    Staphylococcus aureus POSITIVE (A) NEGATIVE Final    Comment: (NOTE) The Xpert SA Assay (FDA approved for NASAL specimens in patients 41 years of age and older), is one component of a comprehensive surveillance program. It is not intended to diagnose infection nor to guide or monitor treatment. Performed at [facility], [address] [place], [place] [address]   Culture, blood (Routine X 2) w Reflex to ID Panel     Status: Abnormal (Preliminary result)   Collection Time: 10/23/22 12:24 PM   Specimen: BLOOD  Result Value Ref Range Status   Specimen Description BLOOD RIGHT ANTECUBITAL  Final   Special Requests Blood Culture adequate volume   Final   Culture  Setup Time   Final    GRAM POSITIVE COCCI ANAEROBIC BOTTLE ONLY CRITICAL RESULT CALLED TO, READ BACK BY AND VERIFIED WITH: K HURTH,PHARMD@0354  10/27/22 MK    Culture (A)  Final    METHICILLIN RESISTANT STAPHYLOCOCCUS AUREUS Sent to [facility] for further susceptibility testing. Performed at [facility], [address], [place], [place] [address]    Report Status PENDING  Incomplete   Organism ID, Bacteria METHICILLIN RESISTANT STAPHYLOCOCCUS AUREUS  Final      Susceptibility   Methicillin resistant staphylococcus aureus - MIC*    CIPROFLOXACIN >=8 RESISTANT Resistant     ERYTHROMYCIN <=0.25 SENSITIVE Sensitive     GENTAMICIN <=0.5 SENSITIVE Sensitive     OXACILLIN >=4 RESISTANT Resistant     TETRACYCLINE <=1 SENSITIVE Sensitive  VANCOMYCIN 1 SENSITIVE Sensitive     TRIMETH/SULFA <=10 SENSITIVE Sensitive     CLINDAMYCIN <=0.25 SENSITIVE Sensitive     RIFAMPIN <=0.5 SENSITIVE Sensitive     Inducible Clindamycin NEGATIVE Sensitive     LINEZOLID 2 SENSITIVE Sensitive     * METHICILLIN RESISTANT STAPHYLOCOCCUS AUREUS  Culture, blood (Routine X 2) w Reflex to ID Panel     Status: None   Collection Time: 10/23/22 12:24 PM   Specimen: BLOOD  Result Value Ref Range Status   Specimen Description BLOOD BLOOD RIGHT FOREARM  Final   Special Requests   Final    BOTTLES DRAWN AEROBIC AND ANAEROBIC Blood Culture adequate volume   Culture   Final    NO GROWTH 5 DAYS Performed at [facility], [address], [place], [place] [address]    Report Status 10/28/2022 FINAL  Final  Blood Culture ID Panel (Reflexed)     Status: Abnormal   Collection Time: 10/23/22 12:24 PM  Result Value Ref Range Status   Enterococcus faecalis NOT DETECTED NOT DETECTED Final   Enterococcus Faecium NOT DETECTED NOT DETECTED Final   Listeria monocytogenes NOT DETECTED NOT DETECTED Final   Staphylococcus species DETECTED (A) NOT DETECTED Final    Comment: CRITICAL RESULT CALLED TO,  READ BACK BY AND VERIFIED WITH: K HURTH,PHARMD@0354  10/27/22 MK    Staphylococcus aureus (BCID) DETECTED (A) NOT DETECTED Final    Comment: Methicillin (oxacillin)-resistant Staphylococcus aureus (MRSA). MRSA is predictably resistant to beta-lactam antibiotics (except ceftaroline). Preferred therapy is vancomycin unless clinically contraindicated. Patient requires contact precautions if  hospitalized. CRITICAL RESULT CALLED TO, READ BACK BY AND VERIFIED WITH: K HURTH,PHARMD@0354  10/27/22 MK    Staphylococcus epidermidis NOT DETECTED NOT DETECTED Final   Staphylococcus lugdunensis NOT DETECTED NOT DETECTED Final   Streptococcus species NOT DETECTED NOT DETECTED Final   Streptococcus agalactiae NOT DETECTED NOT DETECTED Final   Streptococcus pneumoniae NOT DETECTED NOT DETECTED Final   Streptococcus pyogenes NOT DETECTED NOT DETECTED Final   A.calcoaceticus-baumannii NOT DETECTED NOT DETECTED Final   Bacteroides fragilis NOT DETECTED NOT DETECTED Final   Enterobacterales NOT DETECTED NOT DETECTED Final   Enterobacter cloacae complex NOT DETECTED NOT DETECTED Final   Escherichia coli NOT DETECTED NOT DETECTED Final   Klebsiella aerogenes NOT DETECTED NOT DETECTED Final   Klebsiella oxytoca NOT DETECTED NOT DETECTED Final   Klebsiella pneumoniae NOT DETECTED NOT DETECTED Final   Proteus species NOT DETECTED NOT DETECTED Final   Salmonella species NOT DETECTED NOT DETECTED Final   Serratia marcescens NOT DETECTED NOT DETECTED Final   Haemophilus influenzae NOT DETECTED NOT DETECTED Final   Neisseria meningitidis NOT DETECTED NOT DETECTED Final   Pseudomonas aeruginosa NOT DETECTED NOT DETECTED Final   Stenotrophomonas maltophilia NOT DETECTED NOT DETECTED Final   Candida albicans NOT DETECTED NOT DETECTED Final   Candida auris NOT DETECTED NOT DETECTED Final   Candida glabrata NOT DETECTED NOT DETECTED Final   Candida krusei NOT DETECTED NOT DETECTED Final   Candida parapsilosis NOT  DETECTED NOT DETECTED Final   Candida tropicalis NOT DETECTED NOT DETECTED Final   Cryptococcus neoformans/gattii NOT DETECTED NOT DETECTED Final   Meth resistant mecA/C and MREJ DETECTED (A) NOT DETECTED Final    Comment: CRITICAL RESULT CALLED TO, READ BACK BY AND VERIFIED WITH: K HURTH,PHARMD@0354  10/27/22 MK Performed at [facility], [address] [place], [place] [address]   Aerobic/Anaerobic Culture w Gram Stain (surgical/deep wound)     Status: None (Preliminary  result)   Collection Time: 10/25/22 12:35 PM   Specimen: Path fluid; Tissue  Result Value Ref Range Status   Specimen Description FLUID  Final   Special Requests infected bilateral femoral graft  Final   Gram Stain   Final    ABUNDANT WBC PRESENT, PREDOMINANTLY PMN ABUNDANT GRAM POSITIVE COCCI IN PAIRS IN CLUSTERS Performed at [facility], [address], [place], [place] [address]    Culture   Final    MODERATE STAPHYLOCOCCUS AUREUS NO ANAEROBES ISOLATED; CULTURE IN PROGRESS FOR 5 DAYS    Report Status PENDING  Incomplete   Organism ID, Bacteria STAPHYLOCOCCUS AUREUS  Final      Susceptibility   Staphylococcus aureus - MIC*    CIPROFLOXACIN >=8 RESISTANT Resistant     ERYTHROMYCIN <=0.25 SENSITIVE Sensitive     GENTAMICIN <=0.5 SENSITIVE Sensitive     OXACILLIN >=4 RESISTANT Resistant     TETRACYCLINE <=1 SENSITIVE Sensitive     VANCOMYCIN <=0.5 SENSITIVE Sensitive     TRIMETH/SULFA <=10 SENSITIVE Sensitive     CLINDAMYCIN <=0.25 SENSITIVE Sensitive     RIFAMPIN <=0.5 SENSITIVE Sensitive     Inducible Clindamycin NEGATIVE Sensitive     LINEZOLID 2 SENSITIVE Sensitive     * MODERATE STAPHYLOCOCCUS AUREUS  Culture, blood (Routine X 2) w Reflex to ID Panel     Status: Abnormal (Preliminary result)   Collection Time: 10/27/22  2:46 PM   Specimen: BLOOD RIGHT HAND  Result Value Ref Range Status   Specimen Description BLOOD RIGHT HAND  Final   Special Requests   Final    BOTTLES DRAWN AEROBIC AND  ANAEROBIC Blood Culture adequate volume   Culture  Setup Time   Final    GRAM POSITIVE COCCI IN BOTH AEROBIC AND ANAEROBIC BOTTLES CRITICAL VALUE NOTED.  VALUE IS CONSISTENT WITH PREVIOUSLY REPORTED AND CALLED VALUE.    Culture (A)  Final    STAPHYLOCOCCUS AUREUS SUSCEPTIBILITIES PERFORMED ON PREVIOUS CULTURE WITHIN THE LAST 5 DAYS. Performed at [facility], [address] [place], [place] [address]    Report Status PENDING  Incomplete  Culture, blood (Routine X 2) w Reflex to ID Panel     Status: None (Preliminary result)   Collection Time: 10/27/22  2:47 PM   Specimen: BLOOD RIGHT ARM  Result Value Ref Range Status   Specimen Description BLOOD RIGHT ARM  Final   Special Requests   Final    BOTTLES DRAWN AEROBIC AND ANAEROBIC Blood Culture adequate volume   Culture   Final    NO GROWTH 3 DAYS Performed at [facility], [address], [place], [place] [address]    Report Status PENDING  Incomplete  Culture, blood (Routine X 2) w Reflex to ID Panel     Status: None (Preliminary result)   Collection Time: 10/29/22 11:33 AM   Specimen: BLOOD LEFT ARM  Result Value Ref Range Status   Specimen Description BLOOD LEFT ARM  Final   Special Requests   Final    BOTTLES DRAWN AEROBIC AND ANAEROBIC Blood Culture adequate volume   Culture   Final    NO GROWTH < 12 HOURS Performed at [facility], [address], [place], [place] [address]    Report Status PENDING  Incomplete  Culture, blood (Routine X 2) w Reflex to ID Panel     Status: None (Preliminary result)   Collection Time: 10/29/22 11:35 AM   Specimen: BLOOD  Result Value Ref  Range Status   Specimen Description BLOOD RIGHT ANTECUBITAL  Final   Special Requests   Final    BOTTLES DRAWN AEROBIC AND ANAEROBIC Blood Culture adequate volume   Culture   Final    NO GROWTH < 12 HOURS Performed at [facility], [address] [place], [place] [address]    Report Status PENDING  Incomplete     Marcos Eke, NP [facility]  10/30/2022  11:07 AM

## 2022-10-30 NOTE — Progress Notes (Signed)
OT Cancellation Note  Patient Details Name: Edward Campos MRN: [id number] DOB: Aug 25, 1932   Cancelled Treatment:    Reason Eval/Treat Not Completed: Active bedrest order  Lebron Quam, OTR/L [facility] [phone]   Dalphine Handing 10/30/2022, 7:59 AM

## 2022-10-31 ENCOUNTER — Other Ambulatory Visit: Payer: Self-pay

## 2022-10-31 ENCOUNTER — Inpatient Hospital Stay (HOSPITAL_COMMUNITY): Payer: Medicare Other | Admitting: Anesthesiology

## 2022-10-31 ENCOUNTER — Encounter (HOSPITAL_COMMUNITY)
Admission: AD | Disposition: A | Discharge status: Skilled Nursing Facility | Payer: Self-pay | Source: Ambulatory Visit | Attending: Internal Medicine

## 2022-10-31 ENCOUNTER — Encounter (HOSPITAL_COMMUNITY): Payer: Self-pay | Admitting: Internal Medicine

## 2022-10-31 ENCOUNTER — Ambulatory Visit: Payer: Medicare Other | Admitting: Vascular Surgery

## 2022-10-31 DIAGNOSIS — L7634 Postprocedural seroma of skin and subcutaneous tissue following other procedure: Secondary | ICD-10-CM

## 2022-10-31 DIAGNOSIS — L7632 Postprocedural hematoma of skin and subcutaneous tissue following other procedure: Secondary | ICD-10-CM

## 2022-10-31 DIAGNOSIS — I739 Peripheral vascular disease, unspecified: Secondary | ICD-10-CM

## 2022-10-31 DIAGNOSIS — I724 Aneurysm of artery of lower extremity: Secondary | ICD-10-CM | POA: Diagnosis not present

## 2022-10-31 DIAGNOSIS — M7981 Nontraumatic hematoma of soft tissue: Secondary | ICD-10-CM

## 2022-10-31 DIAGNOSIS — I483 Typical atrial flutter: Secondary | ICD-10-CM

## 2022-10-31 DIAGNOSIS — J189 Pneumonia, unspecified organism: Secondary | ICD-10-CM

## 2022-10-31 DIAGNOSIS — I9789 Other postprocedural complications and disorders of the circulatory system, not elsewhere classified: Secondary | ICD-10-CM

## 2022-10-31 DIAGNOSIS — A4102 Sepsis due to Methicillin resistant Staphylococcus aureus: Secondary | ICD-10-CM | POA: Diagnosis not present

## 2022-10-31 HISTORY — PX: APPLICATION OF WOUND VAC: SHX5189

## 2022-10-31 HISTORY — PX: I & D EXTREMITY: SHX5045

## 2022-10-31 LAB — CBC WITH DIFFERENTIAL/PLATELET
Abs Immature Granulocytes: 0.75 10*3/uL — ABNORMAL HIGH (ref 0.00–0.07)
Basophils Absolute: 0 10*3/uL (ref 0.0–0.1)
Basophils Relative: 0 %
Eosinophils Absolute: 0.1 10*3/uL (ref 0.0–0.5)
Eosinophils Relative: 1 %
HCT: 22 % — ABNORMAL LOW (ref 39.0–52.0)
Hemoglobin: 7.5 g/dL — ABNORMAL LOW (ref 13.0–17.0)
Immature Granulocytes: 8 %
Lymphocytes Relative: 8 %
Lymphs Abs: 0.8 10*3/uL (ref 0.7–4.0)
MCH: 29.3 pg (ref 26.0–34.0)
MCHC: 34.1 g/dL (ref 30.0–36.0)
MCV: 85.9 fL (ref 80.0–100.0)
Monocytes Absolute: 0.7 10*3/uL (ref 0.1–1.0)
Monocytes Relative: 7 %
Neutro Abs: 7.2 10*3/uL (ref 1.7–7.7)
Neutrophils Relative %: 76 %
Platelets: 353 10*3/uL (ref 150–400)
RBC: 2.56 MIL/uL — ABNORMAL LOW (ref 4.22–5.81)
RDW: 17.2 % — ABNORMAL HIGH (ref 11.5–15.5)
WBC: 9.6 10*3/uL (ref 4.0–10.5)
nRBC: 0 % (ref 0.0–0.2)

## 2022-10-31 LAB — COMPREHENSIVE METABOLIC PANEL
ALT: 43 U/L (ref 0–44)
AST: 29 U/L (ref 15–41)
Albumin: 2.2 g/dL — ABNORMAL LOW (ref 3.5–5.0)
Alkaline Phosphatase: 66 U/L (ref 38–126)
Anion gap: 10 (ref 5–15)
BUN: 7 mg/dL — ABNORMAL LOW (ref 8–23)
CO2: 22 mmol/L (ref 22–32)
Calcium: 7.5 mg/dL — ABNORMAL LOW (ref 8.9–10.3)
Chloride: 96 mmol/L — ABNORMAL LOW (ref 98–111)
Creatinine, Ser: 0.69 mg/dL (ref 0.61–1.24)
GFR, Estimated: >60 mL/min (ref >60)
Glucose, Bld: 96 mg/dL (ref 70–99)
Potassium: 3.6 mmol/L (ref 3.5–5.1)
Sodium: 128 mmol/L — ABNORMAL LOW (ref 135–145)
Total Bilirubin: 1.1 mg/dL (ref 0.3–1.2)
Total Protein: 5.3 g/dL — ABNORMAL LOW (ref 6.5–8.1)

## 2022-10-31 LAB — CULTURE, BLOOD (ROUTINE X 2): Special Requests: ADEQUATE

## 2022-10-31 LAB — PHOSPHORUS: Phosphorus: 2.3 mg/dL — ABNORMAL LOW (ref 2.5–4.6)

## 2022-10-31 LAB — MAGNESIUM: Magnesium: 1.7 mg/dL (ref 1.7–2.4)

## 2022-10-31 LAB — PREPARE RBC (CROSSMATCH)

## 2022-10-31 LAB — MISC LABCORP TEST (SEND OUT): Labcorp test code: 96388

## 2022-10-31 SURGERY — IRRIGATION AND DEBRIDEMENT EXTREMITY
Anesthesia: General

## 2022-10-31 SURGERY — ECHOCARDIOGRAM, TRANSESOPHAGEAL
Anesthesia: Monitor Anesthesia Care

## 2022-10-31 MED ORDER — SODIUM CHLORIDE 0.9 % IV SOLN: INTRAVENOUS | Status: DC | PRN

## 2022-10-31 MED ORDER — LACTATED RINGERS IV SOLN: INTRAVENOUS | Status: DC | PRN

## 2022-10-31 MED ORDER — HEPARIN 6000 UNIT IRRIGATION SOLUTION
Status: AC
  Filled 2022-10-31: qty 500

## 2022-10-31 MED ORDER — FENTANYL CITRATE (PF) 250 MCG/5ML IJ SOLN
INTRAMUSCULAR | Status: AC
  Filled 2022-10-31: qty 5

## 2022-10-31 MED ORDER — ACETAMINOPHEN 10 MG/ML IV SOLN: 1000.0000 mg | Freq: Once | INTRAVENOUS | Status: DC | PRN

## 2022-10-31 MED ORDER — HEPARIN 6000 UNIT IRRIGATION SOLUTION
Status: DC | PRN
  Administered 2022-10-31: 1

## 2022-10-31 MED ORDER — PROPOFOL 10 MG/ML IV BOLUS
INTRAVENOUS | Status: AC
  Filled 2022-10-31: qty 20

## 2022-10-31 MED ORDER — LABETALOL HCL 5 MG/ML IV SOLN
INTRAVENOUS | Status: AC
  Filled 2022-10-31: qty 4

## 2022-10-31 MED ORDER — PHENYLEPHRINE HCL (PRESSORS) 10 MG/ML IV SOLN
INTRAVENOUS | Status: AC
  Filled 2022-10-31: qty 1

## 2022-10-31 MED ORDER — FENTANYL CITRATE (PF) 250 MCG/5ML IJ SOLN
INTRAMUSCULAR | Status: DC | PRN
  Administered 2022-10-31: 75 ug via INTRAVENOUS
  Administered 2022-10-31: 50 ug via INTRAVENOUS

## 2022-10-31 MED ORDER — FENTANYL CITRATE (PF) 100 MCG/2ML IJ SOLN
INTRAMUSCULAR | Status: AC
  Administered 2022-10-31: 25 ug via INTRAVENOUS
  Filled 2022-10-31: qty 2

## 2022-10-31 MED ORDER — HEMOSTATIC AGENTS (NO CHARGE) OPTIME
TOPICAL | Status: DC | PRN
  Administered 2022-10-31: 1 via TOPICAL

## 2022-10-31 MED ORDER — DEXAMETHASONE SODIUM PHOSPHATE 10 MG/ML IJ SOLN
INTRAMUSCULAR | Status: AC
  Filled 2022-10-31: qty 1

## 2022-10-31 MED ORDER — FENTANYL CITRATE (PF) 100 MCG/2ML IJ SOLN
25.0000 ug | INTRAMUSCULAR | Status: DC | PRN
  Administered 2022-10-31: 25 ug via INTRAVENOUS

## 2022-10-31 MED ORDER — ROCURONIUM BROMIDE 10 MG/ML (PF) SYRINGE
PREFILLED_SYRINGE | INTRAVENOUS | Status: AC
  Filled 2022-10-31: qty 10

## 2022-10-31 MED ORDER — DEXAMETHASONE SODIUM PHOSPHATE 10 MG/ML IJ SOLN: INTRAMUSCULAR | Status: DC | PRN

## 2022-10-31 MED ORDER — ROCURONIUM BROMIDE 10 MG/ML (PF) SYRINGE
PREFILLED_SYRINGE | INTRAVENOUS | Status: AC
  Filled 2022-10-31: qty 20

## 2022-10-31 MED ORDER — SODIUM CHLORIDE 0.9% IV SOLUTION: Freq: Once | INTRAVENOUS | Status: DC

## 2022-10-31 MED ORDER — CEFAZOLIN SODIUM-DEXTROSE 2-3 GM-%(50ML) IV SOLR
INTRAVENOUS | Status: DC | PRN
  Administered 2022-10-31: 2 g via INTRAVENOUS

## 2022-10-31 MED ORDER — SUGAMMADEX SODIUM 200 MG/2ML IV SOLN
INTRAVENOUS | Status: DC | PRN
  Administered 2022-10-31: 200 mg via INTRAVENOUS

## 2022-10-31 MED ORDER — ONDANSETRON HCL 4 MG/2ML IJ SOLN
INTRAMUSCULAR | Status: AC
  Filled 2022-10-31: qty 2

## 2022-10-31 MED ORDER — DEXAMETHASONE SODIUM PHOSPHATE 10 MG/ML IJ SOLN
INTRAMUSCULAR | Status: DC | PRN
  Administered 2022-10-31: 4 mg via INTRAVENOUS

## 2022-10-31 MED ORDER — PHENYLEPHRINE 80 MCG/ML (10ML) SYRINGE FOR IV PUSH (FOR BLOOD PRESSURE SUPPORT)
PREFILLED_SYRINGE | INTRAVENOUS | Status: DC | PRN
  Administered 2022-10-31: 160 ug via INTRAVENOUS
  Administered 2022-10-31: 4 ug via INTRAVENOUS
  Administered 2022-10-31 (×2): 160 ug via INTRAVENOUS
  Administered 2022-10-31: 80 ug via INTRAVENOUS

## 2022-10-31 MED ORDER — LIDOCAINE 2% (20 MG/ML) 5 ML SYRINGE
INTRAMUSCULAR | Status: AC
  Filled 2022-10-31: qty 5

## 2022-10-31 MED ORDER — LACTATED RINGERS IV SOLN: INTRAVENOUS | Status: DC

## 2022-10-31 MED ORDER — ORAL CARE MOUTH RINSE: 15.0000 mL | Freq: Once | OROMUCOSAL | Status: AC

## 2022-10-31 MED ORDER — CHLORHEXIDINE GLUCONATE 0.12 % MT SOLN
15.0000 mL | Freq: Once | OROMUCOSAL | Status: AC
  Administered 2022-10-31: 15 mL via OROMUCOSAL
  Filled 2022-10-31: qty 15

## 2022-10-31 MED ORDER — ONDANSETRON HCL 4 MG/2ML IJ SOLN
INTRAMUSCULAR | Status: DC | PRN
Start: 2022-10-31 — End: 2022-10-31
  Administered 2022-10-31: 4 mg via INTRAVENOUS

## 2022-10-31 MED ORDER — ROCURONIUM BROMIDE 10 MG/ML (PF) SYRINGE
PREFILLED_SYRINGE | INTRAVENOUS | Status: DC | PRN
  Administered 2022-10-31: 50 mg via INTRAVENOUS

## 2022-10-31 MED ORDER — LIDOCAINE 2% (20 MG/ML) 5 ML SYRINGE
INTRAMUSCULAR | Status: DC | PRN
  Administered 2022-10-31: 60 mg via INTRAVENOUS

## 2022-10-31 MED ORDER — PHENYLEPHRINE 80 MCG/ML (10ML) SYRINGE FOR IV PUSH (FOR BLOOD PRESSURE SUPPORT)
PREFILLED_SYRINGE | INTRAVENOUS | Status: AC
  Filled 2022-10-31: qty 60

## 2022-10-31 MED ORDER — PHENYLEPHRINE HCL-NACL 20-0.9 MG/250ML-% IV SOLN
INTRAVENOUS | Status: DC | PRN
  Administered 2022-10-31: 30 ug/min via INTRAVENOUS

## 2022-10-31 MED ORDER — PROPOFOL 10 MG/ML IV BOLUS
INTRAVENOUS | Status: DC | PRN
Start: 2022-10-31 — End: 2022-10-31
  Administered 2022-10-31: 80 mg via INTRAVENOUS

## 2022-10-31 MED ORDER — EPHEDRINE 5 MG/ML INJ
INTRAVENOUS | Status: AC
  Filled 2022-10-31: qty 15

## 2022-10-31 SURGICAL SUPPLY — 50 items
BAG COUNTER SPONGE SURGICOUNT (BAG) ×2 IMPLANT
BAG SPNG CNTER NS LX DISP (BAG) ×2
BNDG ELASTIC 4X5.8 VLCR STR LF (GAUZE/BANDAGES/DRESSINGS) IMPLANT
BNDG ELASTIC 6X5.8 VLCR STR LF (GAUZE/BANDAGES/DRESSINGS) IMPLANT
BNDG GAUZE DERMACEA FLUFF 4 (GAUZE/BANDAGES/DRESSINGS) IMPLANT
BNDG GZE DERMACEA 4 6PLY (GAUZE/BANDAGES/DRESSINGS)
CANISTER SUCT 3000ML PPV (MISCELLANEOUS) ×2 IMPLANT
CANISTER WOUNDNEG PRESSURE 500 (CANNISTER) IMPLANT
CLIP LIGATING EXTRA MED SLVR (CLIP) ×2 IMPLANT
CLIP LIGATING EXTRA SM BLUE (MISCELLANEOUS) ×2 IMPLANT
CONNECTOR Y WND VAC (MISCELLANEOUS) IMPLANT
COVER PROBE W GEL 5X96 (DRAPES) IMPLANT
COVER SURGICAL LIGHT HANDLE (MISCELLANEOUS) ×2 IMPLANT
DRAIN CHANNEL 19F RND (DRAIN) IMPLANT
DRAPE EXTREMITY T 121X128X90 (DISPOSABLE) IMPLANT
DRAPE HALF SHEET 40X57 (DRAPES) IMPLANT
DRAPE INCISE IOBAN 66X45 STRL (DRAPES) IMPLANT
DRAPE ORTHO SPLIT 77X108 STRL (DRAPES)
DRAPE SURG ORHT 6 SPLT 77X108 (DRAPES) IMPLANT
DRESSING PEEL AND PLC PRVNA 13 (GAUZE/BANDAGES/DRESSINGS) IMPLANT
DRSG ADAPTIC 3X8 NADH LF (GAUZE/BANDAGES/DRESSINGS) IMPLANT
DRSG PEEL AND PLACE PREVENA 13 (GAUZE/BANDAGES/DRESSINGS) ×4
ELECT REM PT RETURN 9FT ADLT (ELECTROSURGICAL) ×2
ELECTRODE REM PT RTRN 9FT ADLT (ELECTROSURGICAL) ×2 IMPLANT
GAUZE SPONGE 4X4 12PLY STRL LF (GAUZE/BANDAGES/DRESSINGS) ×2 IMPLANT
GLOVE BIO SURGEON STRL SZ7.5 (GLOVE) ×2 IMPLANT
GLOVE BIOGEL PI IND STRL 7.0 (GLOVE) IMPLANT
GOWN STRL REUS W/ TWL LRG LVL3 (GOWN DISPOSABLE) ×4 IMPLANT
GOWN STRL REUS W/ TWL XL LVL3 (GOWN DISPOSABLE) ×2 IMPLANT
GOWN STRL REUS W/TWL LRG LVL3 (GOWN DISPOSABLE) ×4
GOWN STRL REUS W/TWL XL LVL3 (GOWN DISPOSABLE) ×2
HEMOSTAT SNOW SURGICEL 2X4 (HEMOSTASIS) IMPLANT
KIT BASIN OR (CUSTOM PROCEDURE TRAY) ×2 IMPLANT
KIT TURNOVER KIT B (KITS) ×2 IMPLANT
NS IRRIG 1000ML POUR BTL (IV SOLUTION) ×2 IMPLANT
PACK GENERAL/GYN (CUSTOM PROCEDURE TRAY) IMPLANT
PAD ARMBOARD 7.5X6 YLW CONV (MISCELLANEOUS) ×4 IMPLANT
SUT ETHILON 2 0 FS 18 (SUTURE) IMPLANT
SUT ETHILON 3 0 PS 1 (SUTURE) IMPLANT
SUT PROLENE 5 0 C 1 24 (SUTURE) IMPLANT
SUT PROLENE 6 0 BV (SUTURE) IMPLANT
SUT VIC AB 2-0 CT1 27 (SUTURE) ×6
SUT VIC AB 2-0 CT1 TAPERPNT 27 (SUTURE) IMPLANT
SUT VIC AB 2-0 FS1 27 (SUTURE) IMPLANT
SUT VIC AB 3-0 SH 27 (SUTURE)
SUT VIC AB 3-0 SH 27X BRD (SUTURE) IMPLANT
SUT VICRYL 4-0 PS2 18IN ABS (SUTURE) IMPLANT
TOWEL GREEN STERILE (TOWEL DISPOSABLE) ×4 IMPLANT
TOWEL GREEN STERILE FF (TOWEL DISPOSABLE) ×2 IMPLANT
WATER STERILE IRR 1000ML POUR (IV SOLUTION) ×2 IMPLANT

## 2022-10-31 NOTE — Progress Notes (Signed)
    Assessment/Planning: Procedure: 1.  Redo exposure of bilateral common femoral arteries greater than 30 days 2.  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  Redo right to left femoral-femoral bypass graft with cadaveric great saphenous vein 4.  Left common femoral to left SFA interposition bypass with cadaveric great saphenous vein 5.  Myriad morcells bilateral groin wounds 6.  Bilateral Prevena wound VAC placement  Dry dressing over B groins   Increased drainage with hematoma left  groin Plan for I&D of groins today NPO He agrees to proceed  Mosetta Pigeon PA-C

## 2022-10-31 NOTE — Progress Notes (Signed)
OT Cancellation Note  Patient Details Name: Olajide Bargerstock MRN: [id number] DOB: 14-Apr-1932   Cancelled Treatment:    Reason Eval/Treat Not Completed: Medical issues which prohibited therapy (Pt currently with active bed rest orders and procedure scheduled for this afternoon. OT to reattempt to see pt for skilled OT treatment at a later time as appropriate/available.)  Alyx Gee "Ronaldo Miyamoto" M., OTR/L, MA Acute Rehab [phone]   Lendon Colonel 10/31/2022, 2:55 PM

## 2022-10-31 NOTE — Progress Notes (Signed)
PROGRESS NOTE    Edward Campos  ZOX:[id number] DOB: 1933/02/24 DOA: 10/22/2022 PCP: Patient, No Pcp Per   Brief Narrative:  89 y.o. male with past medical history of HLD, PVD s/p bypass grafting, AAA, SCLC hospitalization in July and 24 for infected right to left femoral to femoral bypass graft requiring temporary wound VAC placement who presented as a direct admission to the vascular service for concern a infected femoral bypass with fevers at home. CT angiogram of the abdomen pelvis noted right bifemoral bypass graft with fluid/inflammatory changes associated with the femorofemoral bypass most prominent on the left compatible with a graft infection, and a 5.8 infrarenal abdominal aortic aneurysm grossly unchanged in size.  He was started on broad-spectrum antibiotics.  Subsequently, [facility] took over as primary team.  He underwent vascular surgery intervention with wound VAC placement on 10/25/2022.  Blood cultures on 10/23/2022 grew MRSA.  ID consulted and currently following: Currently on IV daptomycin.  Assessment & Plan:   Sepsis: Present on admission Infected left femoral graft MRSA bacteremia -Currently on daptomycin as per ID.  Patient will need at least 6 weeks of antibiotics as per ID.  Repeat blood cultures have been negative so far. -2D echo showed no vegetation. -Patient underwent vascular surgery intervention with wound VAC placement on 10/25/2022.  Vascular surgery planning for possible I&D of groins today. -Currently afebrile and hemodynamically stable  Acute on chronic hyponatremia -Baseline sodium of 128-131.  Presented with sodium of 122. -Sodium 128 this morning.  Currently on IV fluids.  Repeat a.m. labs.  Small cell cancer with bony metastases Goals of care -Noted started on chemotherapy because of issues with wound not healing.  Outpatient follow-up with oncology -Palliative care following.  Currently listed as full code.  Overall prognosis is guarded to  poor.  Leukocytosis -Resolved  Anemia of chronic disease -Hemoglobin currently stable.  Monitor intermittently  Hypokalemia -Resolved  Elevated LFTs -Resolved.  Crestor has been discontinued  Hyperlipidemia -Crestor on hold.  Possibly resume on discharge  PAD -Continue aspirin.  Statin on hold.  Continue Plavix  AAA --Patient with 5.8 x 5.7 cm infrarenal abdominal aortic aneurysm unchanged in size. -Vascular surgery following.  Physical deconditioning -PT following and recommending SNF.     DVT prophylaxis: Heparin subcutaneous Code Status: Full Family Communication: None at bedside Disposition Plan: Status is: Inpatient Remains inpatient appropriate because: Of severity of illness  Consultants: Vascular surgery/palliative care/ID  Procedures: As above  Antimicrobials:  Anti-infectives (From admission, onward)    Start     Dose/Rate Route Frequency Ordered Stop   10/26/22 0945  DAPTOmycin (CUBICIN) 600 mg in sodium chloride 0.9 % IVPB        8 mg/kg  75.9 kg 124 mL/hr over 30 Minutes Intravenous Daily 10/26/22 0855     10/26/22 0945  cefTRIAXone (ROCEPHIN) 2 g in sodium chloride 0.9 % 100 mL IVPB  Status:  Discontinued        2 g 200 mL/hr over 30 Minutes Intravenous Every 24 hours 10/26/22 0855 10/26/22 0905   10/25/22 1006  ceFAZolin 1 g / gentamicin 80 mg in NS 500 mL surgical irrigation  Status:  Discontinued          As needed 10/25/22 1006 10/25/22 1318   10/23/22 1145  vancomycin (VANCOREADY) IVPB 1250 mg/250 mL  Status:  Discontinued        1,250 mg 166.7 mL/hr over 90 Minutes Intravenous Every 24 hours 10/23/22 1051 10/26/22 0855   10/23/22 1100  ceFEPIme (  MAXIPIME) 2 g in sodium chloride 0.9 % 100 mL IVPB  Status:  Discontinued        2 g 200 mL/hr over 30 Minutes Intravenous Every 12 hours 10/23/22 1041 10/26/22 0855        Subjective: Patient seen and examined at bedside.  No fever, chest pain, shortness of breath  reported.  Objective: Vitals:   10/30/22 2304 10/31/22 0322 10/31/22 0803 10/31/22 1134  BP: 135/82 (!) 140/78 131/82 134/87  Pulse: 81 80 78 80  Resp: 18 13 15 18   Temp: 97.6 F (36.4 C) 97.9 F (36.6 C) (!) 97.5 F (36.4 C) 98.1 F (36.7 C)  TempSrc: Oral Oral Oral Oral  SpO2: 97% 97% 100% 97%  Weight:      Height:        Intake/Output Summary (Last 24 hours) at 10/31/2022 1428 Last data filed at 10/31/2022 1418 Gross per 24 hour  Intake 3415.58 ml  Output 4900 ml  Net -1484.42 ml   Filed Weights   10/23/22 0431 10/25/22 0645 10/25/22 0651  Weight: 75.9 kg 75.9 kg 75.9 kg    Examination:  General exam: Appears calm and comfortable.  Currently on room air.  Looks chronically ill and deconditioned.  Elderly male lying in bed. Respiratory system: Bilateral decreased breath sounds at bases with scattered crackles Cardiovascular system: S1 & S2 heard, Rate controlled Gastrointestinal system: Abdomen is nondistended, soft and nontender. Normal bowel sounds heard. Extremities: No cyanosis, clubbing, edema  Central nervous system: Alert.  Slow to respond.  Poor historian.  No focal neurological deficits. Moving extremities Skin: Bilateral groin dressing present  psychiatry: Flat affect; not agitated.   Data Reviewed: I have personally reviewed following labs and imaging studies  CBC: Recent Labs  Lab 10/25/22 0524 10/25/22 1001 10/26/22 0120 10/26/22 0748 10/28/22 0230 10/29/22 0237 10/29/22 1133 10/30/22 0327 10/30/22 1246 10/31/22 0701  WBC 10.6*   < > 10.6*   < > 15.9* 13.5* 12.6* 11.4*  --  9.6  NEUTROABS 8.7*  --  8.9*  --   --  12.6*  --  8.9*  --  7.2  HGB 11.1*   < > 8.7*   < > 9.3* 8.5* 8.3* 8.1* 8.3* 7.5*  HCT 31.5*   < > 24.8*   < > 26.0* 24.1* 24.5* 22.6* 24.3* 22.0*  MCV 82.7   < > 85.5   < > 85.0 85.5 87.5 84.6  --  85.9  PLT 254   < > 204   < > 319 321 359 312  --  353   < > = values in this interval not displayed.   Basic Metabolic  Panel: Recent Labs  Lab 10/26/22 0120 10/26/22 0748 10/27/22 0527 10/28/22 0230 10/29/22 0237 10/29/22 1133 10/30/22 0327 10/31/22 0701  NA 127*   < > 127* 126* 125* 126* 126* 128*  K 4.0   < > 3.6 3.3* 4.0 3.8 4.5 3.6  CL 99   < > 96* 94* 94* 91* 96* 96*  CO2 22   < > 24 21* 24 24 19* 22  GLUCOSE 131*   < > 119* 123* 103* 65* 82 96  BUN 20   < > 12 10 12 12 13  7*  CREATININE 0.86   < > 0.92 0.74 0.82 0.81 0.86 0.69  CALCIUM 7.5*   < > 7.6* 7.5* 7.6* 7.5* 7.4* 7.5*  MG 1.7  --  1.7 1.9 1.8  --   --  1.7  PHOS  --   --  1.9* 2.4* 2.4*  --   --  2.3*   < > = values in this interval not displayed.   GFR: Estimated Creatinine Clearance: 65.1 mL/min (by C-G formula based on SCr of 0.69 mg/dL). Liver Function Tests: Recent Labs  Lab 10/26/22 0120 10/27/22 0527 10/28/22 0230 10/29/22 0237 10/31/22 0701  AST 55* 46* 37 32 29  ALT 87* 81* 64* 53* 43  ALKPHOS 48 68 73 65 66  BILITOT 1.0 0.9 1.2 1.1 1.1  PROT 4.6* 5.4* 5.3* 5.1* 5.3*  ALBUMIN 2.2* 2.4* 2.3* 2.1* 2.2*   No results for input(s): "LIPASE", "AMYLASE" in the last 168 hours. No results for input(s): "AMMONIA" in the last 168 hours. Coagulation Profile: No results for input(s): "INR", "PROTIME" in the last 168 hours. Cardiac Enzymes: Recent Labs  Lab 10/27/22 0527  CKTOTAL 104   BNP (last 3 results) No results for input(s): "PROBNP" in the last 8760 hours. HbA1C: No results for input(s): "HGBA1C" in the last 72 hours. CBG: No results for input(s): "GLUCAP" in the last 168 hours. Lipid Profile: No results for input(s): "CHOL", "HDL", "LDLCALC", "TRIG", "CHOLHDL", "LDLDIRECT" in the last 72 hours. Thyroid Function Tests: Recent Labs    10/29/22 1133  TSH 2.282   Anemia Panel: Recent Labs    10/29/22 0852 10/29/22 1133  VITAMINB12 536  --   FOLATE 17.8  --   FERRITIN  --  678*  TIBC  --  164*  IRON  --  29*   Sepsis Labs: No results for input(s): "PROCALCITON", "LATICACIDVEN" in the last 168  hours.  Recent Results (from the past 240 hour(s))  Surgical pcr screen     Status: Abnormal   Collection Time: 10/23/22  7:44 AM   Specimen: Nasal Mucosa; Nasal Swab  Result Value Ref Range Status   MRSA, PCR POSITIVE (A) NEGATIVE Final    Comment: RESULT CALLED TO, READ BACK BY AND VERIFIED WITH: RN MIA RINAL [id number] AT 1029 BY EC    Staphylococcus aureus POSITIVE (A) NEGATIVE Final    Comment: (NOTE) The Xpert SA Assay (FDA approved for NASAL specimens in patients 49 years of age and older), is one component of a comprehensive surveillance program. It is not intended to diagnose infection nor to guide or monitor treatment. Performed at [facility], [address], [place], [place] [address]   Culture, blood (Routine X 2) w Reflex to ID Panel     Status: Abnormal (Preliminary result)   Collection Time: 10/23/22 12:24 PM   Specimen: BLOOD  Result Value Ref Range Status   Specimen Description BLOOD RIGHT ANTECUBITAL  Final   Special Requests Blood Culture adequate volume  Final   Culture  Setup Time   Final    GRAM POSITIVE COCCI ANAEROBIC BOTTLE ONLY CRITICAL RESULT CALLED TO, READ BACK BY AND VERIFIED WITH: K HURTH,PHARMD@0354  10/27/22 MK    Culture (A)  Final    METHICILLIN RESISTANT STAPHYLOCOCCUS AUREUS Sent to [facility] for further susceptibility testing. Performed at [facility], [address], [place], [place] [address]    Report Status PENDING  Incomplete   Organism ID, Bacteria METHICILLIN RESISTANT STAPHYLOCOCCUS AUREUS  Final      Susceptibility   Methicillin resistant staphylococcus aureus - MIC*    CIPROFLOXACIN >=8 RESISTANT Resistant     ERYTHROMYCIN <=0.25 SENSITIVE Sensitive     GENTAMICIN <=0.5 SENSITIVE Sensitive     OXACILLIN >=4 RESISTANT Resistant     TETRACYCLINE <=1 SENSITIVE Sensitive  VANCOMYCIN 1 SENSITIVE Sensitive     TRIMETH/SULFA <=10 SENSITIVE Sensitive     CLINDAMYCIN <=0.25 SENSITIVE Sensitive     RIFAMPIN <=0.5  SENSITIVE Sensitive     Inducible Clindamycin NEGATIVE Sensitive     LINEZOLID 2 SENSITIVE Sensitive     * METHICILLIN RESISTANT STAPHYLOCOCCUS AUREUS  Culture, blood (Routine X 2) w Reflex to ID Panel     Status: None   Collection Time: 10/23/22 12:24 PM   Specimen: BLOOD  Result Value Ref Range Status   Specimen Description BLOOD BLOOD RIGHT FOREARM  Final   Special Requests   Final    BOTTLES DRAWN AEROBIC AND ANAEROBIC Blood Culture adequate volume   Culture   Final    NO GROWTH 5 DAYS Performed at [facility], [address] [place], [place] [address]    Report Status 10/28/2022 FINAL  Final  Blood Culture ID Panel (Reflexed)     Status: Abnormal   Collection Time: 10/23/22 12:24 PM  Result Value Ref Range Status   Enterococcus faecalis NOT DETECTED NOT DETECTED Final   Enterococcus Faecium NOT DETECTED NOT DETECTED Final   Listeria monocytogenes NOT DETECTED NOT DETECTED Final   Staphylococcus species DETECTED (A) NOT DETECTED Final    Comment: CRITICAL RESULT CALLED TO, READ BACK BY AND VERIFIED WITH: K HURTH,PHARMD@0354  10/27/22 MK    Staphylococcus aureus (BCID) DETECTED (A) NOT DETECTED Final    Comment: Methicillin (oxacillin)-resistant Staphylococcus aureus (MRSA). MRSA is predictably resistant to beta-lactam antibiotics (except ceftaroline). Preferred therapy is vancomycin unless clinically contraindicated. Patient requires contact precautions if  hospitalized. CRITICAL RESULT CALLED TO, READ BACK BY AND VERIFIED WITH: K HURTH,PHARMD@0354  10/27/22 MK    Staphylococcus epidermidis NOT DETECTED NOT DETECTED Final   Staphylococcus lugdunensis NOT DETECTED NOT DETECTED Final   Streptococcus species NOT DETECTED NOT DETECTED Final   Streptococcus agalactiae NOT DETECTED NOT DETECTED Final   Streptococcus pneumoniae NOT DETECTED NOT DETECTED Final   Streptococcus pyogenes NOT DETECTED NOT DETECTED Final   A.calcoaceticus-baumannii NOT DETECTED NOT DETECTED Final    Bacteroides fragilis NOT DETECTED NOT DETECTED Final   Enterobacterales NOT DETECTED NOT DETECTED Final   Enterobacter cloacae complex NOT DETECTED NOT DETECTED Final   Escherichia coli NOT DETECTED NOT DETECTED Final   Klebsiella aerogenes NOT DETECTED NOT DETECTED Final   Klebsiella oxytoca NOT DETECTED NOT DETECTED Final   Klebsiella pneumoniae NOT DETECTED NOT DETECTED Final   Proteus species NOT DETECTED NOT DETECTED Final   Salmonella species NOT DETECTED NOT DETECTED Final   Serratia marcescens NOT DETECTED NOT DETECTED Final   Haemophilus influenzae NOT DETECTED NOT DETECTED Final   Neisseria meningitidis NOT DETECTED NOT DETECTED Final   Pseudomonas aeruginosa NOT DETECTED NOT DETECTED Final   Stenotrophomonas maltophilia NOT DETECTED NOT DETECTED Final   Candida albicans NOT DETECTED NOT DETECTED Final   Candida auris NOT DETECTED NOT DETECTED Final   Candida glabrata NOT DETECTED NOT DETECTED Final   Candida krusei NOT DETECTED NOT DETECTED Final   Candida parapsilosis NOT DETECTED NOT DETECTED Final   Candida tropicalis NOT DETECTED NOT DETECTED Final   Cryptococcus neoformans/gattii NOT DETECTED NOT DETECTED Final   Meth resistant mecA/C and MREJ DETECTED (A) NOT DETECTED Final    Comment: CRITICAL RESULT CALLED TO, READ BACK BY AND VERIFIED WITH: K HURTH,PHARMD@0354  10/27/22 MK Performed at [facility], [address] [place], [place] [address]   Aerobic/Anaerobic Culture w Gram Stain (surgical/deep wound)     Status: None  Collection Time: 10/25/22 12:35 PM   Specimen: Path fluid; Tissue  Result Value Ref Range Status   Specimen Description FLUID  Final   Special Requests infected bilateral femoral graft  Final   Gram Stain   Final    ABUNDANT WBC PRESENT, PREDOMINANTLY PMN ABUNDANT GRAM POSITIVE COCCI IN PAIRS IN CLUSTERS    Culture   Final    MODERATE STAPHYLOCOCCUS AUREUS NO ANAEROBES ISOLATED Performed at [facility], [address],  [place], [place] [address]    Report Status 10/30/2022 FINAL  Final   Organism ID, Bacteria STAPHYLOCOCCUS AUREUS  Final      Susceptibility   Staphylococcus aureus - MIC*    CIPROFLOXACIN >=8 RESISTANT Resistant     ERYTHROMYCIN <=0.25 SENSITIVE Sensitive     GENTAMICIN <=0.5 SENSITIVE Sensitive     OXACILLIN >=4 RESISTANT Resistant     TETRACYCLINE <=1 SENSITIVE Sensitive     VANCOMYCIN <=0.5 SENSITIVE Sensitive     TRIMETH/SULFA <=10 SENSITIVE Sensitive     CLINDAMYCIN <=0.25 SENSITIVE Sensitive     RIFAMPIN <=0.5 SENSITIVE Sensitive     Inducible Clindamycin NEGATIVE Sensitive     LINEZOLID 2 SENSITIVE Sensitive     * MODERATE STAPHYLOCOCCUS AUREUS  Culture, blood (Routine X 2) w Reflex to ID Panel     Status: Abnormal   Collection Time: 10/27/22  2:46 PM   Specimen: BLOOD RIGHT HAND  Result Value Ref Range Status   Specimen Description BLOOD RIGHT HAND  Final   Special Requests   Final    BOTTLES DRAWN AEROBIC AND ANAEROBIC Blood Culture adequate volume   Culture  Setup Time   Final    GRAM POSITIVE COCCI IN BOTH AEROBIC AND ANAEROBIC BOTTLES CRITICAL VALUE NOTED.  VALUE IS CONSISTENT WITH PREVIOUSLY REPORTED AND CALLED VALUE.    Culture (A)  Final    STAPHYLOCOCCUS AUREUS SUSCEPTIBILITIES PERFORMED ON PREVIOUS CULTURE WITHIN THE LAST 5 DAYS. Performed at [facility], [address] [place], [place] [address]    Report Status 10/31/2022 FINAL  Final  Culture, blood (Routine X 2) w Reflex to ID Panel     Status: None (Preliminary result)   Collection Time: 10/27/22  2:47 PM   Specimen: BLOOD RIGHT ARM  Result Value Ref Range Status   Specimen Description BLOOD RIGHT ARM  Final   Special Requests   Final    BOTTLES DRAWN AEROBIC AND ANAEROBIC Blood Culture adequate volume   Culture   Final    NO GROWTH 4 DAYS Performed at [facility], [address], [place], [place] [address]    Report Status PENDING  Incomplete  Culture, blood (Routine X 2) w Reflex to ID  Panel     Status: None (Preliminary result)   Collection Time: 10/29/22 11:33 AM   Specimen: BLOOD LEFT ARM  Result Value Ref Range Status   Specimen Description BLOOD LEFT ARM  Final   Special Requests   Final    BOTTLES DRAWN AEROBIC AND ANAEROBIC Blood Culture adequate volume   Culture   Final    NO GROWTH 2 DAYS Performed at [facility], [address] [place], [place] [address]    Report Status PENDING  Incomplete  Culture, blood (Routine X 2) w Reflex to ID Panel     Status: None (Preliminary result)   Collection Time: 10/29/22 11:35 AM   Specimen: BLOOD  Result Value Ref Range Status   Specimen Description BLOOD RIGHT ANTECUBITAL  Final   Special Requests   Final    BOTTLES DRAWN AEROBIC AND ANAEROBIC Blood Culture adequate volume   Culture   Final    NO GROWTH 2 DAYS Performed at [facility], [address] [place], [place] [address]    Report Status PENDING  Incomplete         Radiology Studies: No results found.      Scheduled Meds:  aspirin EC  81 mg Oral Daily   chlorhexidine  15 mL Mouth/Throat BID   clopidogrel  75 mg Oral Daily   docusate sodium  100 mg Oral Daily   fluticasone  2 spray Each Nare Daily   guaiFENesin  1,200 mg Oral BID   heparin  5,000 Units Subcutaneous Q8H   multivitamin with minerals  1 tablet Oral Daily   pantoprazole  40 mg Oral Daily   phosphorus  250 mg Oral BID   sodium chloride flush  3 mL Intravenous Q12H   Continuous Infusions:  sodium chloride     sodium chloride     sodium chloride 100 mL/hr at 10/31/22 0400   DAPTOmycin (CUBICIN) 600 mg in sodium chloride 0.9 % IVPB 600 mg (10/31/22 1356)   lactated ringers            Glade Lloyd, MD [facility] 10/31/2022, 2:28 PM

## 2022-10-31 NOTE — Plan of Care (Signed)
  Problem: Health Behavior/Discharge Planning: ?Goal: Ability to manage health-related needs will improve ?Outcome: Progressing ?  ?Problem: Elimination: ?Goal: Will not experience complications related to bowel motility ?Outcome: Progressing ?  ?Problem: Safety: ?Goal: Ability to remain free from injury will improve ?Outcome: Progressing ?  ?

## 2022-10-31 NOTE — Transfer of Care (Signed)
Immediate Anesthesia Transfer of Care Note  Patient: Edward Campos  Procedure(s) Performed: BILATERAL GROIN WASHOUT AND MUSCLE FLAPS (Bilateral) APPLICATION OF WOUND VAC  Patient Location: PACU  Anesthesia Type:General  Level of Consciousness: awake and oriented  Airway & Oxygen Therapy: Patient Spontanous Breathing and Patient connected to face mask oxygen  Post-op Assessment: Report given to RN and Post -op Vital signs reviewed and stable  Post vital signs: Reviewed and stable  Last Vitals:  Vitals Value Taken Time  BP 163/85 10/31/22 1836  Temp    Pulse 77 10/31/22 1845  Resp 19 10/31/22 1845  SpO2 100 % 10/31/22 1845  Vitals shown include unfiled device data.  Last Pain:  Vitals:   10/31/22 1515  TempSrc:   PainSc: 0-No pain      Patients Stated Pain Goal: 0 (10/31/22 0951)  Complications: No notable events documented.

## 2022-10-31 NOTE — Progress Notes (Signed)
PT Cancellation Note  Patient Details Name: Edward Campos MRN: [id number] DOB: [birth year]   Cancelled Treatment:    Reason Eval/Treat Not Completed: Medical issues which prohibited therapy (Active bedrest order. Will continue to follow.)   [name] 10/31/2022, 2:15 PM

## 2022-10-31 NOTE — Op Note (Signed)
DATE OF SERVICE: 10/31/2022  PATIENT:  Edward Campos  89 y.o. male  PRE-OPERATIVE DIAGNOSIS:  hematoma of right groin; seroma of left groin after femoral-femoral bypass. Concern for pseudoaneurysm  POST-OPERATIVE DIAGNOSIS:  Same; small leak from right common femoral patch   PROCEDURE:   1) direct repair of right common femoral artery pseudoaneurysm 2) incision and drainage of left groin seroma 3) left sartorius flap to left groin 4) right sartorius flap to right groin  SURGEON:  Surgeons and Role:    * Edward Douglas, MD - Primary  ASSISTANT: Edward Cure, PA-C  An experienced assistant was required given the complexity of this procedure and the standard of surgical care. My assistant helped with exposure through counter tension, suctioning, ligation and retraction to better visualize the surgical field.  My assistant expedited sewing during the case by following my sutures. Wherever I use the term "we" in the report, my assistant actively helped me with that portion of the procedure.  ANESTHESIA:   general  EBL:  BLOOD ADMINISTERED: 600 CC PRBC  DRAINS: (2x 32F) Jackson-Pratt drain(s) with closed bulb suction in the bilateral groins    LOCAL MEDICATIONS USED:  NONE  SPECIMEN:  none  COUNTS: confirmed correct.  TOURNIQUET:  none  PATIENT DISPOSITION:  PACU - hemodynamically stable.   Delay start of Pharmacological VTE agent (>24hrs) due to surgical blood loss or risk of bleeding: no  INDICATION FOR PROCEDURE: Edward Campos is a 89 y.o. male with right groin hematoma and left groin seroma after femoral-femoral bypass. There was clinical concern for pseudoaneurysm. After careful discussion of risks, benefits, and alternatives the patient was offered surgical exploration with possible flap coverage.  The patient understood and wished to proceed.  OPERATIVE FINDINGS: Right groin exploration showed 150 cc of hematoma.  Under the hematoma on the common femoral artery  patch angioplasty there was a small focus of active arterial bleeding.  This was repaired with simple Prolene suture.  The femoral-femoral anastomosis was intact on the right side.  Left groin exploration showed blood-tinged seroma.  There is no evidence of active bleeding in the left groin.  Both groins covered with sartorius flap and drained with 19 French channel drains.  DESCRIPTION OF PROCEDURE: After identification of the patient in the pre-operative holding area, the patient was transferred to the operating room. The patient was positioned supine on the operating room table. Anesthesia was induced. The abdomen, groins and thighs were prepped and draped in standard fashion. A surgical pause was performed confirming correct patient, procedure, and operative location.  Staple and suture material removed from the right groin.  Hematoma was encountered.  This was evacuated.  This had to be manually debrided from the common femoral artery patch angioplasty.  We identified a small focus of arterial bleeding on the medial aspect of the patch angioplasty near an area of prior interrupted repair.  This was rendered hemostatic with several sutures of 5-0 Prolene in interrupted fashion.  We evaluated the femoral-femoral proximal anastomosis and found to be intact.  Staple and suture material were removed from the left groin.  A seroma was encountered and evacuated.  We evaluated the distal femoral-femoral anastomosis and found it to be hemostatic.  A left groin sartorius flap was created by identifying the sartorius muscle and tracing at back to the anterior superior iliac spine.  It was taken off its attachment on the anterior superior iliac spine and rotated medially to cover the femoral sheath.  This was sutured  in place with several interrupted 2-0 Vicryl sutures.  A right groin sartorius flap was created by identifying the sartorius muscle and tracing at back to the anterior superior iliac spine.  It  was taken off its attachment on the anterior superior iliac spine and rotated medially to cover the femoral sheath.  This was sutured in place with several interrupted 2-0 Vicryl sutures.  70F drains were placed into the surgical beds. They were secured to the skin with 2-O nylon. The drains were connected to bulb suction.   The groins were closed in layers using 2-O vicryl and staples.   Prevena VACs were applied to the groins.  Upon completion of the case instrument and sharps counts were confirmed correct. The patient was transferred to the PACU in good condition. I was present for all portions of the procedure.  FOLLOW UP PLAN: Bedrest x 24 hours. Head of bed to 30 degrees. Start PT / OT 48 hours. Keep drains until output <100cc/24h. Keep VAC on incision for 7 days or until [employer] loses seal.  Edward Campos. Edward Antu, MD Edward Campos Phone Number: [phone] 10/31/2022 6:08 PM

## 2022-10-31 NOTE — Anesthesia Procedure Notes (Signed)
Procedure Name: Intubation Date/Time: 10/31/2022 4:51 PM  Performed by: Gus Puma, CRNAPre-anesthesia Checklist: Patient identified, Emergency Drugs available, Suction available and Patient being monitored Patient Re-evaluated:Patient Re-evaluated prior to induction Oxygen Delivery Method: Circle System Utilized Preoxygenation: Pre-oxygenation with 100% oxygen Induction Type: IV induction Ventilation: Mask ventilation without difficulty Laryngoscope Size: Mac and 4 Grade View: Grade I Tube type: Oral Tube size: 7.5 mm Number of attempts: 1 Airway Equipment and Method: Stylet Placement Confirmation: ETT inserted through vocal cords under direct vision, positive ETCO2 and breath sounds checked- equal and bilateral Secured at: 23 cm Tube secured with: Tape Dental Injury: Teeth and Oropharynx as per pre-operative assessment

## 2022-10-31 NOTE — Progress Notes (Signed)
[facility]  Subjective  - No new concerns this am from patient   Objective 131/82 80 (!) 97.5 F (36.4 C) (Oral) 13 97%  Intake/Output Summary (Last 24 hours) at 10/31/2022 0854 Last data filed at 10/31/2022 0806 Gross per 24 hour  Intake 2670.35 ml  Output 4525 ml  Net -1854.65 ml    Drain OP 105, vacs with good suction Firmness right medical groin with ecchymosis, small hematoma, NTTP Doppler signals maintained B DP Lungs non labored breathing  Assessment/Planning: Procedure: 1.  Redo exposure of bilateral common femoral arteries greater than 30 days 2.  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  Redo right to left femoral-femoral bypass graft with cadaveric great saphenous vein 4.  Left common femoral to left SFA interposition bypass with cadaveric great saphenous vein 5.  Myriad morcells bilateral groin wounds 6.  Bilateral Prevena wound VAC placement   Dressings changed. More hematoma right groin.  No active bleeding. OR today for washout. Remained concerned about possible anastomotic disruption even though CT did not show any. Seroma present.   Pt aware graft may be ligated pending findings should there be bleeding and concern for infected graft.   Bedrest  Victorino Sparrow 10/31/2022 8:54 AM --  VASCULAR STAFF ADDENDUM: I have independently interviewed and examined the patient. I agree with the above.  Patient seen and examined this morning, left VAC not to seal.  Both vacs removed.  Small hematoma right groin, no larger than yesterday evening, however puffiness in the left groin with some drainage, this appears to be clear.  I placed a dry dressings on the wounds, and reassessed Coury several hours later.  There was some serosanguineous drainage, there was mostly serous.  I think that he has a sizable seroma in the left groin, which is tracking over to the right groin.  The inferior portion of the right  groin is nice and soft.  There is some hematoma in the superior portion.  I think that the graft remains intact, however with the amount of serous drainage she is having, I do not think that another vacuum dressing placement will be sufficient.  I had a long discussion with both Jashad and his daughter regarding reopening the groins, seroma washout, muscle flaps, vacuum-assisted dressing with wound healing via secondary intention.  He is aware that should wean in the groin, and there is anastomotic disruption, this would result in femorofemoral ligation, oversewing of the profundus stump and superficial femoral artery stump, likely left-sided above-knee amputation that may or may not heal.  In short, should his femoral-femoral bypass fail, this would likely be a mortal event as I do not think he has the blood flow necessary to heal a left AKA stump, with possible need for right-sided amputation as well.  Please make n.p.o. midnight, bilateral groin washout, muscle flaps for seroma, hematoma washout  Fara Olden, MD [facility] Phone Number: [phone] 10/31/2022 8:54 AM    Laboratory Lab Results: Recent Labs    10/30/22 0327 10/30/22 1246 10/31/22 0701  WBC 11.4*  --  9.6  HGB 8.1* 8.3* 7.5*  HCT 22.6* 24.3* 22.0*  PLT 312  --  353   BMET Recent Labs    10/30/22 0327 10/31/22 0701  NA 126* 128*  K 4.5 3.6  CL 96* 96*  CO2 19* 22  GLUCOSE 82 96  BUN 13 7*  CREATININE 0.86 0.69  CALCIUM  7.4* 7.5*    COAG Lab Results  Component Value Date   INR 1.2 08/16/2022   INR 1.1 06/04/2019   No results found for: "PTT"

## 2022-10-31 NOTE — Anesthesia Preprocedure Evaluation (Signed)
Anesthesia Evaluation  Patient identified by MRN, date of birth, ID band Patient awake    Reviewed: Allergy & Precautions, NPO status , Patient's Chart, lab work & pertinent test results  Airway Mallampati: II  TM Distance: >3 FB Neck ROM: Full    Dental  (+) Edentulous Upper   Pulmonary former smoker   breath sounds clear to auscultation       Cardiovascular + Peripheral Vascular Disease   Rhythm:Regular Rate:Normal     Neuro/Psych   Anxiety     negative neurological ROS     GI/Hepatic Neg liver ROS,,,  Endo/Other  negative endocrine ROS    Renal/GU negative Renal ROS     Musculoskeletal  (+) Arthritis ,    Abdominal   Peds  Hematology negative hematology ROS (+)   Anesthesia Other Findings   Reproductive/Obstetrics                             Anesthesia Physical Anesthesia Plan  ASA: 3  Anesthesia Plan: General   Post-op Pain Management: Tylenol PO (pre-op)*   Induction: Intravenous  PONV Risk Score and Plan: 3 and Ondansetron and Treatment may vary due to age or medical condition  Airway Management Planned: Oral ETT  Additional Equipment: Arterial line  Intra-op Plan:   Post-operative Plan: Possible Post-op intubation/ventilation  Informed Consent: I have reviewed the patients History and Physical, chart, labs and discussed the procedure including the risks, benefits and alternatives for the proposed anesthesia with the patient or authorized representative who has indicated his/her understanding and acceptance.     Dental advisory given  Plan Discussed with: CRNA  Anesthesia Plan Comments: (- 2 IV's & arterial line in the room - PRBC's in room.)       Anesthesia Quick Evaluation

## 2022-10-31 NOTE — Anesthesia Procedure Notes (Signed)
Arterial Line Insertion Start/End8/21/2024 4:56 PM, 10/31/2022 5:02 PM Performed by: Shelton Silvas, MD, Andrell Bergeson, Canary Brim, CRNA, anesthesiologist  Patient location: OR. Preanesthetic checklist: patient identified, IV checked, site marked, risks and benefits discussed, surgical consent, monitors and equipment checked, pre-op evaluation, timeout performed and anesthesia consent Patient sedated Catheter size: 20 G Hand hygiene performed  and Seldinger technique used Allen's test indicative of satisfactory collateral circulation Attempts: 1 Procedure performed without using ultrasound guided technique. Following insertion, dressing applied and Biopatch. Post procedure assessment: normal  Patient tolerated the procedure well with no immediate complications.

## 2022-10-31 NOTE — Progress Notes (Signed)
   Palliative Medicine Inpatient Follow Up Note  Chart reviewed.   Plan for surgical washout, I&D of groins  The PMT will reassess in the oncoming days.  Goals at this time are clear - please refer to note from 8/20  No Charge ______________________________________________________________________________________ Edward Campos [facility] Palliative Medicine Team Team Cell Phone: [phone] Please utilize secure chat with additional questions, if there is no response within 30 minutes please call the above phone number  Palliative Medicine Team providers are available by phone from 7am to 7pm daily and can be reached through the team cell phone.  Should this patient require assistance outside of these hours, please call the patient's attending physician.

## 2022-11-01 ENCOUNTER — Encounter (HOSPITAL_COMMUNITY): Payer: Self-pay | Admitting: Vascular Surgery

## 2022-11-01 DIAGNOSIS — T827XXA Infection and inflammatory reaction due to other cardiac and vascular devices, implants and grafts, initial encounter: Secondary | ICD-10-CM | POA: Diagnosis not present

## 2022-11-01 DIAGNOSIS — A4102 Sepsis due to Methicillin resistant Staphylococcus aureus: Secondary | ICD-10-CM | POA: Diagnosis not present

## 2022-11-01 DIAGNOSIS — B9562 Methicillin resistant Staphylococcus aureus infection as the cause of diseases classified elsewhere: Secondary | ICD-10-CM | POA: Diagnosis not present

## 2022-11-01 LAB — TYPE AND SCREEN
ABO/RH(D): O POS
Antibody Screen: NEGATIVE
Unit division: 0
Unit division: 0

## 2022-11-01 LAB — BASIC METABOLIC PANEL
Anion gap: 10 (ref 5–15)
BUN: 14 mg/dL (ref 8–23)
CO2: 20 mmol/L — ABNORMAL LOW (ref 22–32)
Calcium: 7.6 mg/dL — ABNORMAL LOW (ref 8.9–10.3)
Chloride: 97 mmol/L — ABNORMAL LOW (ref 98–111)
Creatinine, Ser: 0.82 mg/dL (ref 0.61–1.24)
GFR, Estimated: >60 mL/min (ref >60)
Glucose, Bld: 104 mg/dL — ABNORMAL HIGH (ref 70–99)
Potassium: 4.2 mmol/L (ref 3.5–5.1)
Sodium: 127 mmol/L — ABNORMAL LOW (ref 135–145)

## 2022-11-01 LAB — CBC WITH DIFFERENTIAL/PLATELET
Abs Immature Granulocytes: 0.69 10*3/uL — ABNORMAL HIGH (ref 0.00–0.07)
Basophils Absolute: 0 10*3/uL (ref 0.0–0.1)
Basophils Relative: 0 %
Eosinophils Absolute: 0 10*3/uL (ref 0.0–0.5)
Eosinophils Relative: 0 %
HCT: 25.5 % — ABNORMAL LOW (ref 39.0–52.0)
Hemoglobin: 8.8 g/dL — ABNORMAL LOW (ref 13.0–17.0)
Immature Granulocytes: 5 %
Lymphocytes Relative: 4 %
Lymphs Abs: 0.6 10*3/uL — ABNORMAL LOW (ref 0.7–4.0)
MCH: 29.3 pg (ref 26.0–34.0)
MCHC: 34.5 g/dL (ref 30.0–36.0)
MCV: 85 fL (ref 80.0–100.0)
Monocytes Absolute: 0.9 10*3/uL (ref 0.1–1.0)
Monocytes Relative: 6 %
Neutro Abs: 12.9 10*3/uL — ABNORMAL HIGH (ref 1.7–7.7)
Neutrophils Relative %: 85 %
Platelets: 342 10*3/uL (ref 150–400)
RBC: 3 MIL/uL — ABNORMAL LOW (ref 4.22–5.81)
RDW: 16.3 % — ABNORMAL HIGH (ref 11.5–15.5)
WBC: 15 10*3/uL — ABNORMAL HIGH (ref 4.0–10.5)
nRBC: 0.1 % (ref 0.0–0.2)

## 2022-11-01 LAB — MINIMUM INHIBITORY CONC. (1 DRUG)

## 2022-11-01 LAB — BPAM RBC
Blood Product Expiration Date: 202409142359
Blood Product Expiration Date: 202409162359
ISSUE DATE / TIME: 202408211730
ISSUE DATE / TIME: 202408211730
Unit Type and Rh: 5100
Unit Type and Rh: 5100

## 2022-11-01 LAB — CULTURE, BLOOD (ROUTINE X 2)
Culture: NO GROWTH
Special Requests: ADEQUATE

## 2022-11-01 LAB — MAGNESIUM: Magnesium: 1.7 mg/dL (ref 1.7–2.4)

## 2022-11-01 LAB — MIC RESULT

## 2022-11-01 MED ORDER — SODIUM CHLORIDE 1 G PO TABS
1.0000 g | ORAL_TABLET | Freq: Three times a day (TID) | ORAL | Status: DC
  Administered 2022-11-01 – 2022-11-12 (×33): 1 g via ORAL
  Filled 2022-11-01 (×33): qty 1

## 2022-11-01 NOTE — Progress Notes (Signed)
PROGRESS NOTE    Edward Campos  LFY:[id number] DOB: 1933/03/07 DOA: 10/22/2022 PCP: Patient, No Pcp Per   Brief Narrative:  89 y.o. male with past medical history of HLD, PVD s/p bypass grafting, AAA, SCLC hospitalization in July and 24 for infected right to left femoral to femoral bypass graft requiring temporary wound VAC placement who presented as a direct admission to the vascular service for concern a infected femoral bypass with fevers at home. CT angiogram of the abdomen pelvis noted right bifemoral bypass graft with fluid/inflammatory changes associated with the femorofemoral bypass most prominent on the left compatible with a graft infection, and a 5.8 infrarenal abdominal aortic aneurysm grossly unchanged in size.  He was started on broad-spectrum antibiotics.  Subsequently, [facility] took over as primary team.  He underwent vascular surgery intervention with wound VAC placement on 10/25/2022.  Blood cultures on 10/23/2022 grew MRSA.  ID consulted and currently following: Currently on IV daptomycin.  Patient underwent direct repair of right common femoral artery pseudoaneurysm; I&D of left groin seroma, left sartorius flap to left groin and right sartorius flap to right groin by vascular surgery on 10/31/2022.  Assessment & Plan:   Sepsis: Present on admission Infected left femoral graft MRSA bacteremia -Currently on daptomycin as per ID.  Patient will need at least 6 weeks of antibiotics as per ID.  Repeat blood cultures have been negative so far. -2D echo showed no vegetation. -Patient underwent vascular surgery intervention with wound VAC placement on 10/25/2022.   -Currently afebrile and hemodynamically stable -underwent direct repair of right common femoral artery pseudoaneurysm; I&D of left groin seroma, left sartorius flap to left groin and right sartorius flap to right groin by vascular surgery on 10/31/2022.  Wound and VAC care as per vascular surgery.  Acute on chronic  hyponatremia -Baseline sodium of 128-131.  Presented with sodium of 122. -Sodium 127 this morning.  Currently on IV fluids.  DC IV fluids.  Start salt tablets.  Repeat a.m. labs.  Small cell cancer with bony metastases Goals of care -Noted started on chemotherapy because of issues with wound not healing.  Outpatient follow-up with oncology -Palliative care following.  Currently listed as full code.  Overall prognosis is guarded to poor.  Leukocytosis -WBCs have bumped up to 15 today.  Repeat a.m. labs.  Anemia of chronic disease -Hemoglobin currently stable.  Monitor intermittently  Hypokalemia -Resolved  Elevated LFTs -Resolved.  Crestor has been discontinued  Hyperlipidemia -Crestor on hold.  Possibly resume on discharge  PAD -Continue aspirin.  Statin on hold.  Continue Plavix  AAA --Patient with 5.8 x 5.7 cm infrarenal abdominal aortic aneurysm unchanged in size. -Vascular surgery following.  Physical deconditioning -PT following and recommending SNF.     DVT prophylaxis: Heparin subcutaneous Code Status: Full Family Communication: None at bedside Disposition Plan: Status is: Inpatient Remains inpatient appropriate because: Of severity of illness  Consultants: Vascular surgery/palliative care/ID  Procedures: As above  Antimicrobials:  Anti-infectives (From admission, onward)    Start     Dose/Rate Route Frequency Ordered Stop   10/26/22 0945  DAPTOmycin (CUBICIN) 600 mg in sodium chloride 0.9 % IVPB        8 mg/kg  75.9 kg 124 mL/hr over 30 Minutes Intravenous Daily 10/26/22 0855     10/26/22 0945  cefTRIAXone (ROCEPHIN) 2 g in sodium chloride 0.9 % 100 mL IVPB  Status:  Discontinued        2 g 200 mL/hr over 30 Minutes Intravenous Every 24  hours 10/26/22 0855 10/26/22 0905   10/25/22 1006  ceFAZolin 1 g / gentamicin 80 mg in NS 500 mL surgical irrigation  Status:  Discontinued          As needed 10/25/22 1006 10/25/22 1318   10/23/22 1145  vancomycin  (VANCOREADY) IVPB 1250 mg/250 mL  Status:  Discontinued        1,250 mg 166.7 mL/hr over 90 Minutes Intravenous Every 24 hours 10/23/22 1051 10/26/22 0855   10/23/22 1100  ceFEPIme (MAXIPIME) 2 g in sodium chloride 0.9 % 100 mL IVPB  Status:  Discontinued        2 g 200 mL/hr over 30 Minutes Intravenous Every 12 hours 10/23/22 1041 10/26/22 0855        Subjective: Patient seen and examined at bedside.  No chest pain or shortness of breath, fever or vomiting reported. Objective: Vitals:   10/31/22 2041 10/31/22 2201 10/31/22 2300 11/01/22 0300  BP: (!) 154/80 (!) 156/76 (!) 146/73 (!) 151/63  Pulse: 82 80 79 83  Resp: 13 15 12 12   Temp: (!) 97.4 F (36.3 C) 97.9 F (36.6 C)  97.7 F (36.5 C)  TempSrc: Axillary Axillary  Axillary  SpO2: 100% 100% 100% 100%  Weight:      Height:        Intake/Output Summary (Last 24 hours) at 11/01/2022 0722 Last data filed at 11/01/2022 0552 Gross per 24 hour  Intake 2900.23 ml  Output 2475 ml  Net 425.23 ml   Filed Weights   10/25/22 0645 10/25/22 0651 10/31/22 1453  Weight: 75.9 kg 75.9 kg 76 kg    Examination:  General: Currently intermittently requiring 2 L oxygen by nasal cannula.  No distress.  Chronically ill and deconditioned looking. ENT/neck: No thyromegaly.  JVD is not elevated  respiratory: Decreased breath sounds at bases bilaterally with some crackles; no wheezing  CVS: S1-S2 heard, rate controlled currently Abdominal: Soft, nontender, slightly distended; no organomegaly, bowel sounds are heard Extremities: Trace lower extremity edema; no cyanosis  CNS: Awake and alert.  Still slow to respond and a poor historian.  No focal neurologic deficit.  Moves extremities Lymph: No obvious lymphadenopathy Skin: Bilateral groin dressing present psych: Mostly flat affect.  Currently not agitated.   Musculoskeletal: No obvious joint swelling/deformity    Data Reviewed: I have personally reviewed following labs and imaging  studies  CBC: Recent Labs  Lab 10/26/22 0120 10/26/22 0748 10/29/22 0237 10/29/22 1133 10/30/22 0327 10/30/22 1246 10/31/22 0701 11/01/22 0415  WBC 10.6*   < > 13.5* 12.6* 11.4*  --  9.6 15.0*  NEUTROABS 8.9*  --  12.6*  --  8.9*  --  7.2 12.9*  HGB 8.7*   < > 8.5* 8.3* 8.1* 8.3* 7.5* 8.8*  HCT 24.8*   < > 24.1* 24.5* 22.6* 24.3* 22.0* 25.5*  MCV 85.5   < > 85.5 87.5 84.6  --  85.9 85.0  PLT 204   < > 321 359 312  --  353 342   < > = values in this interval not displayed.   Basic Metabolic Panel: Recent Labs  Lab 10/27/22 0527 10/28/22 0230 10/29/22 0237 10/29/22 1133 10/30/22 0327 10/31/22 0701 11/01/22 0415  NA 127* 126* 125* 126* 126* 128* 127*  K 3.6 3.3* 4.0 3.8 4.5 3.6 4.2  CL 96* 94* 94* 91* 96* 96* 97*  CO2 24 21* 24 24 19* 22 20*  GLUCOSE 119* 123* 103* 65* 82 96 104*  BUN 12 10 12  12 13 7* 14  CREATININE 0.92 0.74 0.82 0.81 0.86 0.69 0.82  CALCIUM 7.6* 7.5* 7.6* 7.5* 7.4* 7.5* 7.6*  MG 1.7 1.9 1.8  --   --  1.7 1.7  PHOS 1.9* 2.4* 2.4*  --   --  2.3*  --    GFR: Estimated Creatinine Clearance: 63.5 mL/min (by C-G formula based on SCr of 0.82 mg/dL). Liver Function Tests: Recent Labs  Lab 10/26/22 0120 10/27/22 0527 10/28/22 0230 10/29/22 0237 10/31/22 0701  AST 55* 46* 37 32 29  ALT 87* 81* 64* 53* 43  ALKPHOS 48 68 73 65 66  BILITOT 1.0 0.9 1.2 1.1 1.1  PROT 4.6* 5.4* 5.3* 5.1* 5.3*  ALBUMIN 2.2* 2.4* 2.3* 2.1* 2.2*   No results for input(s): "LIPASE", "AMYLASE" in the last 168 hours. No results for input(s): "AMMONIA" in the last 168 hours. Coagulation Profile: No results for input(s): "INR", "PROTIME" in the last 168 hours. Cardiac Enzymes: Recent Labs  Lab 10/27/22 0527  CKTOTAL 104   BNP (last 3 results) No results for input(s): "PROBNP" in the last 8760 hours. HbA1C: No results for input(s): "HGBA1C" in the last 72 hours. CBG: No results for input(s): "GLUCAP" in the last 168 hours. Lipid Profile: No results for input(s):  "CHOL", "HDL", "LDLCALC", "TRIG", "CHOLHDL", "LDLDIRECT" in the last 72 hours. Thyroid Function Tests: Recent Labs    10/29/22 1133  TSH 2.282   Anemia Panel: Recent Labs    10/29/22 0852 10/29/22 1133  VITAMINB12 536  --   FOLATE 17.8  --   FERRITIN  --  678*  TIBC  --  164*  IRON  --  29*   Sepsis Labs: No results for input(s): "PROCALCITON", "LATICACIDVEN" in the last 168 hours.  Recent Results (from the past 240 hour(s))  Surgical pcr screen     Status: Abnormal   Collection Time: 10/23/22  7:44 AM   Specimen: Nasal Mucosa; Nasal Swab  Result Value Ref Range Status   MRSA, PCR POSITIVE (A) NEGATIVE Final    Comment: RESULT CALLED TO, READ BACK BY AND VERIFIED WITH: RN MIA RINAL [phone] AT 1029 BY EC    Staphylococcus aureus POSITIVE (A) NEGATIVE Final    Comment: (NOTE) The Xpert SA Assay (FDA approved for NASAL specimens in patients 23 years of age and older), is one component of a comprehensive surveillance program. It is not intended to diagnose infection nor to guide or monitor treatment. Performed at [facility], [address] [place], [place] [address]   Culture, blood (Routine X 2) w Reflex to ID Panel     Status: Abnormal (Preliminary result)   Collection Time: 10/23/22 12:24 PM   Specimen: BLOOD  Result Value Ref Range Status   Specimen Description BLOOD RIGHT ANTECUBITAL  Final   Special Requests Blood Culture adequate volume  Final   Culture  Setup Time   Final    GRAM POSITIVE COCCI ANAEROBIC BOTTLE ONLY CRITICAL RESULT CALLED TO, READ BACK BY AND VERIFIED WITH: K HURTH,PHARMD@0354  10/27/22 MK    Culture (A)  Final    METHICILLIN RESISTANT STAPHYLOCOCCUS AUREUS Sent to [facility] for further susceptibility testing. Performed at [facility], [address] [place], [place] [address]    Report Status PENDING  Incomplete   Organism ID, Bacteria METHICILLIN RESISTANT STAPHYLOCOCCUS AUREUS  Final      Susceptibility   Methicillin  resistant staphylococcus aureus - MIC*    CIPROFLOXACIN >=8 RESISTANT Resistant  ERYTHROMYCIN <=0.25 SENSITIVE Sensitive     GENTAMICIN <=0.5 SENSITIVE Sensitive     OXACILLIN >=4 RESISTANT Resistant     TETRACYCLINE <=1 SENSITIVE Sensitive     VANCOMYCIN 1 SENSITIVE Sensitive     TRIMETH/SULFA <=10 SENSITIVE Sensitive     CLINDAMYCIN <=0.25 SENSITIVE Sensitive     RIFAMPIN <=0.5 SENSITIVE Sensitive     Inducible Clindamycin NEGATIVE Sensitive     LINEZOLID 2 SENSITIVE Sensitive     * METHICILLIN RESISTANT STAPHYLOCOCCUS AUREUS  Culture, blood (Routine X 2) w Reflex to ID Panel     Status: None   Collection Time: 10/23/22 12:24 PM   Specimen: BLOOD  Result Value Ref Range Status   Specimen Description BLOOD BLOOD RIGHT FOREARM  Final   Special Requests   Final    BOTTLES DRAWN AEROBIC AND ANAEROBIC Blood Culture adequate volume   Culture   Final    NO GROWTH 5 DAYS Performed at [facility], [address], [place], [place] [address]    Report Status 10/28/2022 FINAL  Final  Blood Culture ID Panel (Reflexed)     Status: Abnormal   Collection Time: 10/23/22 12:24 PM  Result Value Ref Range Status   Enterococcus faecalis NOT DETECTED NOT DETECTED Final   Enterococcus Faecium NOT DETECTED NOT DETECTED Final   Listeria monocytogenes NOT DETECTED NOT DETECTED Final   Staphylococcus species DETECTED (A) NOT DETECTED Final    Comment: CRITICAL RESULT CALLED TO, READ BACK BY AND VERIFIED WITH: K HURTH,PHARMD@0354  10/27/22 MK    Staphylococcus aureus (BCID) DETECTED (A) NOT DETECTED Final    Comment: Methicillin (oxacillin)-resistant Staphylococcus aureus (MRSA). MRSA is predictably resistant to beta-lactam antibiotics (except ceftaroline). Preferred therapy is vancomycin unless clinically contraindicated. Patient requires contact precautions if  hospitalized. CRITICAL RESULT CALLED TO, READ BACK BY AND VERIFIED WITH: K HURTH,PHARMD@0354  10/27/22 MK    Staphylococcus  epidermidis NOT DETECTED NOT DETECTED Final   Staphylococcus lugdunensis NOT DETECTED NOT DETECTED Final   Streptococcus species NOT DETECTED NOT DETECTED Final   Streptococcus agalactiae NOT DETECTED NOT DETECTED Final   Streptococcus pneumoniae NOT DETECTED NOT DETECTED Final   Streptococcus pyogenes NOT DETECTED NOT DETECTED Final   A.calcoaceticus-baumannii NOT DETECTED NOT DETECTED Final   Bacteroides fragilis NOT DETECTED NOT DETECTED Final   Enterobacterales NOT DETECTED NOT DETECTED Final   Enterobacter cloacae complex NOT DETECTED NOT DETECTED Final   Escherichia coli NOT DETECTED NOT DETECTED Final   Klebsiella aerogenes NOT DETECTED NOT DETECTED Final   Klebsiella oxytoca NOT DETECTED NOT DETECTED Final   Klebsiella pneumoniae NOT DETECTED NOT DETECTED Final   Proteus species NOT DETECTED NOT DETECTED Final   Salmonella species NOT DETECTED NOT DETECTED Final   Serratia marcescens NOT DETECTED NOT DETECTED Final   Haemophilus influenzae NOT DETECTED NOT DETECTED Final   Neisseria meningitidis NOT DETECTED NOT DETECTED Final   Pseudomonas aeruginosa NOT DETECTED NOT DETECTED Final   Stenotrophomonas maltophilia NOT DETECTED NOT DETECTED Final   Candida albicans NOT DETECTED NOT DETECTED Final   Candida auris NOT DETECTED NOT DETECTED Final   Candida glabrata NOT DETECTED NOT DETECTED Final   Candida krusei NOT DETECTED NOT DETECTED Final   Candida parapsilosis NOT DETECTED NOT DETECTED Final   Candida tropicalis NOT DETECTED NOT DETECTED Final   Cryptococcus neoformans/gattii NOT DETECTED NOT DETECTED Final   Meth resistant mecA/C and MREJ DETECTED (A) NOT DETECTED Final    Comment: CRITICAL RESULT CALLED TO, READ BACK BY AND VERIFIED WITH: K HURTH,PHARMD@0354   10/27/22 MK Performed at [facility], [address], [place], [place] [address]   Aerobic/Anaerobic Culture w Gram Stain (surgical/deep wound)     Status: None   Collection Time: 10/25/22 12:35 PM    Specimen: Path fluid; Tissue  Result Value Ref Range Status   Specimen Description FLUID  Final   Special Requests infected bilateral femoral graft  Final   Gram Stain   Final    ABUNDANT WBC PRESENT, PREDOMINANTLY PMN ABUNDANT GRAM POSITIVE COCCI IN PAIRS IN CLUSTERS    Culture   Final    MODERATE STAPHYLOCOCCUS AUREUS NO ANAEROBES ISOLATED Performed at [facility], [address], [place], [place] [address]    Report Status 10/30/2022 FINAL  Final   Organism ID, Bacteria STAPHYLOCOCCUS AUREUS  Final      Susceptibility   Staphylococcus aureus - MIC*    CIPROFLOXACIN >=8 RESISTANT Resistant     ERYTHROMYCIN <=0.25 SENSITIVE Sensitive     GENTAMICIN <=0.5 SENSITIVE Sensitive     OXACILLIN >=4 RESISTANT Resistant     TETRACYCLINE <=1 SENSITIVE Sensitive     VANCOMYCIN <=0.5 SENSITIVE Sensitive     TRIMETH/SULFA <=10 SENSITIVE Sensitive     CLINDAMYCIN <=0.25 SENSITIVE Sensitive     RIFAMPIN <=0.5 SENSITIVE Sensitive     Inducible Clindamycin NEGATIVE Sensitive     LINEZOLID 2 SENSITIVE Sensitive     * MODERATE STAPHYLOCOCCUS AUREUS  Culture, blood (Routine X 2) w Reflex to ID Panel     Status: Abnormal   Collection Time: 10/27/22  2:46 PM   Specimen: BLOOD RIGHT HAND  Result Value Ref Range Status   Specimen Description BLOOD RIGHT HAND  Final   Special Requests   Final    BOTTLES DRAWN AEROBIC AND ANAEROBIC Blood Culture adequate volume   Culture  Setup Time   Final    GRAM POSITIVE COCCI IN BOTH AEROBIC AND ANAEROBIC BOTTLES CRITICAL VALUE NOTED.  VALUE IS CONSISTENT WITH PREVIOUSLY REPORTED AND CALLED VALUE.    Culture (A)  Final    STAPHYLOCOCCUS AUREUS SUSCEPTIBILITIES PERFORMED ON PREVIOUS CULTURE WITHIN THE LAST 5 DAYS. Performed at [facility], [address] [place], [place] [address]    Report Status 10/31/2022 FINAL  Final  Culture, blood (Routine X 2) w Reflex to ID Panel     Status: None   Collection Time: 10/27/22  2:47 PM   Specimen: BLOOD  RIGHT ARM  Result Value Ref Range Status   Specimen Description BLOOD RIGHT ARM  Final   Special Requests   Final    BOTTLES DRAWN AEROBIC AND ANAEROBIC Blood Culture adequate volume   Culture   Final    NO GROWTH 5 DAYS Performed at [facility], [address], [place], [place] [address]    Report Status 11/01/2022 FINAL  Final  Culture, blood (Routine X 2) w Reflex to ID Panel     Status: None (Preliminary result)   Collection Time: 10/29/22 11:33 AM   Specimen: BLOOD LEFT ARM  Result Value Ref Range Status   Specimen Description BLOOD LEFT ARM  Final   Special Requests   Final    BOTTLES DRAWN AEROBIC AND ANAEROBIC Blood Culture adequate volume   Culture   Final    NO GROWTH 3 DAYS Performed at [facility], [address], [place], [place] [address]    Report Status PENDING  Incomplete  Culture, blood (Routine X 2) w Reflex to ID Panel  Status: None (Preliminary result)   Collection Time: 10/29/22 11:35 AM   Specimen: BLOOD  Result Value Ref Range Status   Specimen Description BLOOD RIGHT ANTECUBITAL  Final   Special Requests   Final    BOTTLES DRAWN AEROBIC AND ANAEROBIC Blood Culture adequate volume   Culture   Final    NO GROWTH 3 DAYS Performed at [facility], [address], [place], [place] [address]    Report Status PENDING  Incomplete  Minimum Inhibitory Conc. (1 Drug)     Status: None   Collection Time: 10/29/22 11:35 AM  Result Value Ref Range Status   Min Inhibitory Conc (1 Drug) Preliminary report  Final    Comment: (NOTE) Performed At: [facility] [address] [place], [place] [id number] Jolene Schimke MD KG:[id number]    Source CRE SAUR BLOOD  Final    Comment: Performed at [facility], [address] [place], [place] [address]         Radiology Studies: PERIPHERAL VASCULAR CATHETERIZATION  Result Date: 10/31/2022 See surgical note for result.       Scheduled Meds:  sodium chloride   Intravenous  Once   aspirin EC  81 mg Oral Daily   chlorhexidine  15 mL Mouth/Throat BID   clopidogrel  75 mg Oral Daily   docusate sodium  100 mg Oral Daily   fluticasone  2 spray Each Nare Daily   guaiFENesin  1,200 mg Oral BID   heparin  5,000 Units Subcutaneous Q8H   multivitamin with minerals  1 tablet Oral Daily   pantoprazole  40 mg Oral Daily   phosphorus  250 mg Oral BID   sodium chloride flush  3 mL Intravenous Q12H   Continuous Infusions:  sodium chloride     sodium chloride     DAPTOmycin (CUBICIN) 600 mg in sodium chloride 0.9 % IVPB 600 mg (10/31/22 1356)   lactated ringers            Glade Lloyd, MD [facility] 11/01/2022, 7:22 AM

## 2022-11-01 NOTE — Anesthesia Postprocedure Evaluation (Signed)
Anesthesia Post Note  Patient: Edward Campos  Procedure(s) Performed: BILATERAL GROIN WASHOUT AND MUSCLE FLAPS (Bilateral) APPLICATION OF WOUND VAC     Patient location during evaluation: PACU Anesthesia Type: General Level of consciousness: awake and alert Pain management: pain level controlled Vital Signs Assessment: post-procedure vital signs reviewed and stable Respiratory status: spontaneous breathing, nonlabored ventilation, respiratory function stable and patient connected to nasal cannula oxygen Cardiovascular status: blood pressure returned to baseline and stable Postop Assessment: no apparent nausea or vomiting Anesthetic complications: no   No notable events documented.               Shelton Silvas

## 2022-11-01 NOTE — Progress Notes (Signed)
OT Cancellation Note  Patient Details Name: Ekin Bardo MRN: [id number] DOB: 29-Apr-1932   Cancelled Treatment:    Reason Eval/Treat Not Completed: Active bedrest order (Pt with current active bed rest order through the end of the day 8/22. OT to reattempt to see pt tomorrow as available/appropriate.)  Rosanne Sack "Orson Eva., OTR/L, MA Acute Rehab [phone]   Lendon Colonel 11/01/2022, 11:06 AM

## 2022-11-01 NOTE — Plan of Care (Signed)
°  Problem: Coping: °Goal: Level of anxiety will decrease °Outcome: Progressing °  °

## 2022-11-01 NOTE — Progress Notes (Addendum)
[facility]   Subjective  - feeling well eating breakfast    Objective (!) 151/63 83 97.7 F (36.5 C) (Axillary) 12 100%  Intake/Output Summary (Last 24 hours) at 11/01/2022 0739 Last data filed at 11/01/2022 3244 Gross per 24 hour  Intake 2900.23 ml  Output 2475 ml  Net 425.23 ml   JP drains working well, cleared lines at bedside OP 575 Groins softer DP dopplers maintained B LE Heart RRR Lungs non labored breathing  Assessment/Planning: POD # 1  PROCEDURE:   1) direct repair of right common femoral artery pseudoaneurysm 2) incision and drainage of left groin seroma 3) left sartorius flap to left groin 4) right sartorius flap to right groin  Maintained B LE perfusion. Bedrest for 24 hours post op, HOB 50 degrees just to eat then restricted to 30 degrees for 8 more hours.   Groins softer, good suction in vacs, JP working with watery bloody discharge.    Will maintain vacs.   Leukocytosis 15 followed by ID.   HGB stable post 4 units total transfused this admission due to surgical blood loss anemia.      Mosetta Pigeon 11/01/2022 7:39 AM --  Laboratory Lab Results: Recent Labs    10/31/22 0701 11/01/22 0415  WBC 9.6 15.0*  HGB 7.5* 8.8*  HCT 22.0* 25.5*  PLT 353 342   BMET Recent Labs    10/31/22 0701 11/01/22 0415  NA 128* 127*  K 3.6 4.2  CL 96* 97*  CO2 22 20*  GLUCOSE 96 104*  BUN 7* 14  CREATININE 0.69 0.82  CALCIUM 7.5* 7.6*    COAG Lab Results  Component Value Date   INR 1.2 08/16/2022   INR 1.1 06/04/2019   No results found for: "PTT"  VASCULAR STAFF ADDENDUM: I have independently interviewed and examined the patient. I agree with the above.  Bedrest today. OK to mobilize to bathroom. Start PT / OT tomorrow. Keep drains until output <100cc/24h.  Keep VAC on incision for 7 days or until [facility] loses seal.   Rande Brunt. Lenell Antu, MD [facility] of [facility] Phone  Number: [phone] 11/01/2022 7:59 AM

## 2022-11-01 NOTE — Progress Notes (Signed)
PT Cancellation Note  Patient Details Name: Edward Campos MRN: [id number] DOB: 06-Sep-1932   Cancelled Treatment:    Reason Eval/Treat Not Completed: (P) Patient not medically ready (Pt with active bedrest order. Will continue to follow.)   [name] 11/01/2022, 8:12 AM

## 2022-11-01 NOTE — TOC Progression Note (Signed)
Transition of Care [facility]) - Progression Note    Patient Details  Name: Edward Campos MRN: [id number] Date of Birth: 06-04-1932  Transition of Care [facility]) CM/SW Contact  Dellie Burns [place], [place] Phone Number: 11/01/2022, 1:14 PM  Clinical Narrative: met with pt and pt's dtr, provided current SNF offers. Initial SNF search sent to limited number of SNFs in [facility]. Pt states SNF does not have to be in [facility] where he lives and requests SNF search be expanded to [place], [place], and [place]. Will f/u with offers as available.   Dellie Burns, MSW, LCSW [phone] (coverage)      Expected Discharge Plan: Skilled Nursing Facility Barriers to Discharge: Continued Medical Work up  Expected Discharge Plan and Services   Discharge Planning Services: CM Consult Post Acute Care Choice: Home Health, Resumption of Svcs/PTA Provider, Skilled Nursing Facility Living arrangements for the past 2 months: Single Family Home                           HH Arranged: PT, OT, Nurse's Aide, RN, Social Work [employer] Agency: [facility] Date HH Agency Contacted: 10/26/22 Time HH Agency Contacted: 1458 Representative spoke with at [facility] Agency: Morrie Sheldon   Social Determinants of Health (SDOH) Interventions SDOH Screenings   Food Insecurity: No Food Insecurity (10/25/2022)  Housing: Low Risk  (10/25/2022)  Transportation Needs: No Transportation Needs (10/25/2022)  Utilities: Not At Risk (10/25/2022)  Tobacco Use: Medium Risk (10/31/2022)    Readmission Risk Interventions     No data to display

## 2022-11-01 NOTE — Progress Notes (Incomplete)
PHARMACY CONSULT NOTE FOR:  OUTPATIENT  PARENTERAL ANTIBIOTIC THERAPY (OPAT)  Informational as planned discharge to SNF  Indication: MRSA bacteremia/endovascular infection Regimen: Daptomycin 600 mg IV ever 24 hours End date: 12/11/22 (6 weeks from OR 8/21)  IV antibiotic discharge orders are pended. To discharging provider:  please sign these orders via discharge navigator,  Select New Orders & click on the button choice - Manage This Unsigned Work.     Thank you for allowing pharmacy to be a part of this patient's care.  Georgina Pillion, PharmD, BCPS, BCIDP Infectious Diseases Clinical Pharmacist 11/07/2022 3:40 PM   **Pharmacist phone directory can now be found on [email or web address] ([email or web address]).  Listed under [facility].

## 2022-11-02 DIAGNOSIS — A4102 Sepsis due to Methicillin resistant Staphylococcus aureus: Secondary | ICD-10-CM | POA: Diagnosis not present

## 2022-11-02 LAB — CBC
HCT: 24.9 % — ABNORMAL LOW (ref 39.0–52.0)
Hemoglobin: 8.5 g/dL — ABNORMAL LOW (ref 13.0–17.0)
MCH: 29.5 pg (ref 26.0–34.0)
MCHC: 34.1 g/dL (ref 30.0–36.0)
MCV: 86.5 fL (ref 80.0–100.0)
Platelets: 373 10*3/uL (ref 150–400)
RBC: 2.88 MIL/uL — ABNORMAL LOW (ref 4.22–5.81)
RDW: 17.1 % — ABNORMAL HIGH (ref 11.5–15.5)
WBC: 9.1 10*3/uL (ref 4.0–10.5)
nRBC: 0.2 % (ref 0.0–0.2)

## 2022-11-02 LAB — POCT I-STAT 7, (LYTES, BLD GAS, ICA,H+H)
Acid-base deficit: 1 mmol/L (ref 0.0–2.0)
Bicarbonate: 23.3 mmol/L (ref 20.0–28.0)
Calcium, Ion: 1.12 mmol/L — ABNORMAL LOW (ref 1.15–1.40)
HCT: 19 % — ABNORMAL LOW (ref 39.0–52.0)
Hemoglobin: 6.5 g/dL — CL (ref 13.0–17.0)
O2 Saturation: 100 %
Potassium: 3.8 mmol/L (ref 3.5–5.1)
Sodium: 129 mmol/L — ABNORMAL LOW (ref 135–145)
TCO2: 24 mmol/L (ref 22–32)
pCO2 arterial: 37.5 mmHg (ref 32–48)
pH, Arterial: 7.402 (ref 7.35–7.45)
pO2, Arterial: 323 mmHg — ABNORMAL HIGH (ref 83–108)

## 2022-11-02 LAB — BASIC METABOLIC PANEL
Anion gap: 8 (ref 5–15)
BUN: 20 mg/dL (ref 8–23)
CO2: 24 mmol/L (ref 22–32)
Calcium: 7.5 mg/dL — ABNORMAL LOW (ref 8.9–10.3)
Chloride: 94 mmol/L — ABNORMAL LOW (ref 98–111)
Creatinine, Ser: 0.95 mg/dL (ref 0.61–1.24)
GFR, Estimated: >60 mL/min (ref >60)
Glucose, Bld: 114 mg/dL — ABNORMAL HIGH (ref 70–99)
Potassium: 4.5 mmol/L (ref 3.5–5.1)
Sodium: 126 mmol/L — ABNORMAL LOW (ref 135–145)

## 2022-11-02 LAB — MAGNESIUM: Magnesium: 1.9 mg/dL (ref 1.7–2.4)

## 2022-11-02 LAB — CK: Total CK: 113 U/L (ref 49–397)

## 2022-11-02 NOTE — Progress Notes (Signed)
[facility]  Date of Admission:  10/22/2022   Total days of inpatient antibiotics 10  Principal Problem:   Sepsis due to methicillin resistant Staphylococcus aureus (MRSA) (HCC) Active Problems:   Peripheral vascular disease (HCC)   Infection of vascular bypass graft (HCC)   Small cell lung cancer, left lower lobe (HCC)   Sepsis (HCC)   Hypokalemia   Transaminitis   Hyponatremia   AAA (abdominal aortic aneurysm) (HCC)   PAD (peripheral artery disease) (HCC)   MRSA bacteremia          Assessment:  Edward Campos with:    #MRSA bacteremia #PVD status post vascular surgery March 2021, and the left groin, infected right femorofemoral dacryon bypass status post excision right and left graft and redo of cadaveric vein, cultures were 98/59 Staph aureus - 8/19 blood Cx NG x 4 days -No prosthetic/graft material retained following excision in OR.  As such don't anticipate need for suppressive abx -TTE no veg noted, TEE recommended Recommendations: -TEE canceled, unclear the reason to myself. I re-ordered it -Continue dapto, follow sens MRSA dapto -Place PICC once TEE dispo is elucidated  plan on 6 weeks of daptomycin -Follow blood Cx form 8/19  Dr. Daiva Eves is covering this weekend    Microbiology:   Antibiotics: daot Cultures: Blood  8/13 1/2 MRSA 8/17 1/2 MRSA 8/19 pending   SUBJECTIVE: Restin in bed Interval: afebrile overnihgt  Review of Systems: Review of Systems  All other systems reviewed and are negative.    Scheduled Meds:  sodium chloride   Intravenous Once   aspirin EC  81 mg Oral Daily   clopidogrel  75 mg Oral Daily   docusate sodium  100 mg Oral Daily   fluticasone  2 spray Each Nare Daily   guaiFENesin  1,200 mg Oral BID   heparin  5,000 Units Subcutaneous Q8H   multivitamin with minerals  1 tablet Oral Daily   pantoprazole  40 mg Oral Daily   sodium chloride flush  3 mL Intravenous Q12H   sodium chloride  1 g Oral  TID WC   Continuous Infusions:  sodium chloride     sodium chloride     DAPTOmycin (CUBICIN) 600 mg in sodium chloride 0.9 % IVPB 600 mg (11/02/22 1443)   PRN Meds:.sodium chloride, sodium chloride, acetaminophen, alum & mag hydroxide-simeth, bisacodyl, diphenhydrAMINE, guaiFENesin-dextromethorphan, hydrALAZINE, HYDROmorphone (DILAUDID) injection, labetalol, loperamide, metoprolol tartrate, ondansetron, oxyCODONE, phenol, potassium chloride, senna-docusate, sodium chloride flush Allergies  Allergen Reactions   Penicillins Other (See Comments)    OBJECTIVE: Vitals:   11/02/22 0803 11/02/22 1150 11/02/22 1616 11/02/22 1921  BP: (!) 142/77 113/63 129/81 (!) 142/81  Pulse: 95  84 60  Resp: 16 18 16 16   Temp: 98.3 F (36.8 C) 98 F (36.7 C) 98 F (36.7 C) 97.8 F (36.6 C)  TempSrc: Oral Oral Oral Oral  SpO2: 100% 97% 100% 100%  Weight:      Height:       Body mass index is 24.74 kg/m.  Physical Exam Constitutional:      General: He is not in acute distress.    Appearance: He is normal weight. He is not toxic-appearing.  HENT:     Head: Normocephalic and atraumatic.     Right Ear: External ear normal.     Left Ear: External ear normal.     Nose: No congestion or rhinorrhea.     Mouth/Throat:  Mouth: Mucous membranes are moist.     Pharynx: Oropharynx is clear.  Eyes:     Extraocular Movements: Extraocular movements intact.     Conjunctiva/sclera: Conjunctivae normal.     Pupils: Pupils are equal, round, and reactive to light.  Cardiovascular:     Rate and Rhythm: Normal rate and regular rhythm.     Heart sounds: No murmur heard.    No friction rub. No gallop.  Pulmonary:     Effort: Pulmonary effort is normal.     Breath sounds: Normal breath sounds.  Abdominal:     General: Abdomen is flat. Bowel sounds are normal.     Palpations: Abdomen is soft.  Musculoskeletal:        General: No swelling.     Cervical back: Normal range of motion and neck supple.      Comments: Lower extremity wounds  Skin:    General: Skin is warm and dry.  Neurological:     General: No focal deficit present.     Mental Status: He is oriented to person, place, and time.  Psychiatric:        Mood and Affect: Mood normal.       Lab Results Lab Results  Component Value Date   WBC 9.1 11/02/2022   HGB 8.5 (L) 11/02/2022   HCT 24.9 (L) 11/02/2022   MCV 86.5 11/02/2022   PLT 373 11/02/2022    Lab Results  Component Value Date   CREATININE 0.95 11/02/2022   BUN 20 11/02/2022   NA 126 (L) 11/02/2022   K 4.5 11/02/2022   CL 94 (L) 11/02/2022   CO2 24 11/02/2022    Lab Results  Component Value Date   ALT 43 10/31/2022   AST 29 10/31/2022   ALKPHOS 66 10/31/2022   BILITOT 1.1 10/31/2022        Danelle Earthly, MD [facility] 11/02/2022, 7:29 PM I have personally spent 52 minutes involved in face-to-face and non-face-to-face activities for this patient on the day of the visit. Professional time spent includes the following activities: Preparing to see the patient (review of tests), Obtaining and/or reviewing separately obtained history (admission/discharge record), Performing a medically appropriate examination and/or evaluation , Ordering medications/tests/procedures, referring and communicating with other health care professionals, Documenting clinical information in the EMR, Independently interpreting results (not separately reported), Communicating results to the patient/family/caregiver, Counseling and educating the patient/family/caregiver and Care coordination (not separately reported).

## 2022-11-02 NOTE — Progress Notes (Addendum)
[facility] [place]  Subjective  -  No change   Objective 126/75 88 97.7 F (36.5 C) (Oral) 18 96%  Intake/Output Summary (Last 24 hours) at 11/02/2022 0651 Last data filed at 11/02/2022 4098 Gross per 24 hour  Intake 1070 ml  Output 1137 ml  Net -67 ml   JP/Vac  B groins total OP 287 cc last 24 hours Left 282 cc, right 5 cc, vac no change in OP No evidence of re current hematoma B groins Feet warm well perfused with intact motor Lungs non labored breathing Heart RRR    Assessment/Planning: POD# 2 PROCEDURE:   1) direct repair of right common femoral artery pseudoaneurysm 2) incision and drainage of left groin seroma 3) left sartorius flap to left groin 4) right sartorius flap to right groin  10/25/22 Femoral-femoral bypass graft excision Left common femoral vein primary repair Redo femorofemoral bypass using cadaveric greater saphenous vein -Right common femoral to left profunda with interposition bypass to the superficial femoral artery. Myriad Morcel placement-2 g Bilateral Prevena wound VAC placement By Dr. Karin Lieu and Dr. Chestine Spore   Maintained B LE perfusion. Sponge may need to be changed on the right pending exam.  Restart PT/OT today  Improved leukocytosis HGB 8.5 stable   Plan per DR. Deland Slocumb: Keep drains until output <100cc/24h.  Keep VAC on incision for 7 days or until [facility] loses seal.    Edward Campos 11/02/2022 6:51 AM --  Laboratory Lab Results: Recent Labs    11/01/22 0415 11/02/22 0429  WBC 15.0* 9.1  HGB 8.8* 8.5*  HCT 25.5* 24.9*  PLT 342 373   BMET Recent Labs    11/01/22 0415 11/02/22 0429  NA 127* 126*  K 4.2 4.5  CL 97* 94*  CO2 20* 24  GLUCOSE 104* 114*  BUN 14 20  CREATININE 0.82 0.95  CALCIUM 7.6* 7.5*    COAG Lab Results  Component Value Date   INR 1.2 08/16/2022   INR 1.1 06/04/2019   No results found for: "PTT"  VASCULAR STAFF ADDENDUM: I have independently interviewed and  examined the patient. I agree with the above.   Rande Brunt. Lenell Antu, MD [facility] Phone Number: [phone] 11/02/2022 12:30 PM

## 2022-11-02 NOTE — Progress Notes (Signed)
PROGRESS NOTE    Edward Campos  MVH:[id number] DOB: 08/14/1932 DOA: 10/22/2022 PCP: Patient, No Pcp Per   Brief Narrative:  89 y.o. male with past medical history of HLD, PVD s/p bypass grafting, AAA, SCLC hospitalization in July and 24 for infected right to left femoral to femoral bypass graft requiring temporary wound VAC placement who presented as a direct admission to the vascular service for concern a infected femoral bypass with fevers at home. CT angiogram of the abdomen pelvis noted right bifemoral bypass graft with fluid/inflammatory changes associated with the femorofemoral bypass most prominent on the left compatible with a graft infection, and a 5.8 infrarenal abdominal aortic aneurysm grossly unchanged in size.  He was started on broad-spectrum antibiotics.  Subsequently, [facility] took over as primary team.  He underwent vascular surgery intervention with wound VAC placement on 10/25/2022.  Blood cultures on 10/23/2022 grew MRSA.  ID consulted and currently following: Currently on IV daptomycin.  Patient underwent direct repair of right common femoral artery pseudoaneurysm; I&D of left groin seroma, left sartorius flap to left groin and right sartorius flap to right groin by vascular surgery on 10/31/2022.  Assessment & Plan:   Sepsis: Present on admission Infected left femoral graft MRSA bacteremia -Currently on daptomycin as per ID.  Patient will need at least 6 weeks of antibiotics as per ID.  Repeat blood cultures have been negative so far. -2D echo showed no vegetation. -Patient underwent vascular surgery intervention with wound VAC placement on 10/25/2022.   -Currently afebrile and hemodynamically stable -underwent direct repair of right common femoral artery pseudoaneurysm; I&D of left groin seroma, left sartorius flap to left groin and right sartorius flap to right groin by vascular surgery on 10/31/2022.  Wound and VAC care as per vascular surgery.  Acute on chronic  hyponatremia -Baseline sodium of 128-131.  Presented with sodium of 122. -Sodium 126 this morning.  Off IV fluids.  Continue salt tablets.  Will do fluid restriction.  Repeat a.m. labs.  Small cell cancer with bony metastases Goals of care -Noted started on chemotherapy because of issues with wound not healing.  Outpatient follow-up with oncology -Palliative care following.  Currently listed as full code.  Overall prognosis is guarded to poor.  Leukocytosis -Resolved  Anemia of chronic disease -Hemoglobin currently stable.  Monitor intermittently  Hypokalemia -Resolved  Elevated LFTs -Resolved.  Crestor has been discontinued  Hyperlipidemia -Crestor on hold.  Possibly resume on discharge  PAD -Continue aspirin.  Statin on hold.  Continue Plavix  AAA --Patient with 5.8 x 5.7 cm infrarenal abdominal aortic aneurysm unchanged in size. -Vascular surgery following.  Physical deconditioning -PT following and recommending SNF.     DVT prophylaxis: Heparin subcutaneous Code Status: Full Family Communication: None at bedside Disposition Plan: Status is: Inpatient Remains inpatient appropriate because: Of severity of illness  Consultants: Vascular surgery/palliative care/ID  Procedures: As above  Antimicrobials:  Anti-infectives (From admission, onward)    Start     Dose/Rate Route Frequency Ordered Stop   10/26/22 0945  DAPTOmycin (CUBICIN) 600 mg in sodium chloride 0.9 % IVPB        8 mg/kg  75.9 kg 124 mL/hr over 30 Minutes Intravenous Daily 10/26/22 0855     10/26/22 0945  cefTRIAXone (ROCEPHIN) 2 g in sodium chloride 0.9 % 100 mL IVPB  Status:  Discontinued        2 g 200 mL/hr over 30 Minutes Intravenous Every 24 hours 10/26/22 0855 10/26/22 0905   10/25/22 1006  ceFAZolin 1 g / gentamicin 80 mg in NS 500 mL surgical irrigation  Status:  Discontinued          As needed 10/25/22 1006 10/25/22 1318   10/23/22 1145  vancomycin (VANCOREADY) IVPB 1250 mg/250 mL   Status:  Discontinued        1,250 mg 166.7 mL/hr over 90 Minutes Intravenous Every 24 hours 10/23/22 1051 10/26/22 0855   10/23/22 1100  ceFEPIme (MAXIPIME) 2 g in sodium chloride 0.9 % 100 mL IVPB  Status:  Discontinued        2 g 200 mL/hr over 30 Minutes Intravenous Every 12 hours 10/23/22 1041 10/26/22 0855        Subjective: Patient seen and examined at bedside.  Complains of intermittent bilateral groin pain.  No chest pain, shortness of breath, fever reported.   Objective: Vitals:   11/01/22 1950 11/01/22 2300 11/02/22 0312 11/02/22 0400  BP: 102/75 (!) 124/96 126/75   Pulse: 97 83 83 88  Resp: 16 16 18    Temp: 97.7 F (36.5 C) 97.6 F (36.4 C) 97.7 F (36.5 C)   TempSrc: Oral Oral Oral   SpO2: 99% 99% 98% 96%  Weight:      Height:        Intake/Output Summary (Last 24 hours) at 11/02/2022 0740 Last data filed at 11/02/2022 0700 Gross per 24 hour  Intake 1070 ml  Output 1167 ml  Net -97 ml   Filed Weights   10/25/22 0645 10/25/22 0651 10/31/22 1453  Weight: 75.9 kg 75.9 kg 76 kg    Examination:  General: No acute distress.  On room air currently.  Chronically ill and deconditioned looking. ENT/neck: No obvious JVD elevation noted.  No palpable neck masses  respiratory: Bilateral decreased breath sounds at bases with scattered crackles  CVS: Rate mostly controlled; S1 and S2 are heard Abdominal: Soft, nontender, distended mildly; no organomegaly, normal bowel sounds heard Extremities: No clubbing; mild lower extremity edema present CNS: Alert and oriented.  Poor historian; slow to respond.  No focal neurologic deficit.  Able to moves extremities Lymph: No palpable lymphadenopathy Skin: Bilateral groins have dressing psych: Not agitated currently.  Flat affect mostly.   Musculoskeletal: No obvious joint tenderness/erythema   Data Reviewed: I have personally reviewed following labs and imaging studies  CBC: Recent Labs  Lab 10/29/22 0237 10/29/22 1133  10/30/22 0327 10/30/22 1246 10/31/22 0701 11/01/22 0415 11/02/22 0429  WBC 13.5* 12.6* 11.4*  --  9.6 15.0* 9.1  NEUTROABS 12.6*  --  8.9*  --  7.2 12.9*  --   HGB 8.5* 8.3* 8.1* 8.3* 7.5* 8.8* 8.5*  HCT 24.1* 24.5* 22.6* 24.3* 22.0* 25.5* 24.9*  MCV 85.5 87.5 84.6  --  85.9 85.0 86.5  PLT 321 359 312  --  353 342 373   Basic Metabolic Panel: Recent Labs  Lab 10/27/22 0527 10/28/22 0230 10/29/22 0237 10/29/22 1133 10/30/22 0327 10/31/22 0701 11/01/22 0415 11/02/22 0429  NA 127* 126* 125* 126* 126* 128* 127* 126*  K 3.6 3.3* 4.0 3.8 4.5 3.6 4.2 4.5  CL 96* 94* 94* 91* 96* 96* 97* 94*  CO2 24 21* 24 24 19* 22 20* 24  GLUCOSE 119* 123* 103* 65* 82 96 104* 114*  BUN 12 10 12 12 13  7* 14 20  CREATININE 0.92 0.74 0.82 0.81 0.86 0.69 0.82 0.95  CALCIUM 7.6* 7.5* 7.6* 7.5* 7.4* 7.5* 7.6* 7.5*  MG 1.7 1.9 1.8  --   --  1.7  1.7 1.9  PHOS 1.9* 2.4* 2.4*  --   --  2.3*  --   --    GFR: Estimated Creatinine Clearance: 54.8 mL/min (by C-G formula based on SCr of 0.95 mg/dL). Liver Function Tests: Recent Labs  Lab 10/27/22 0527 10/28/22 0230 10/29/22 0237 10/31/22 0701  AST 46* 37 32 29  ALT 81* 64* 53* 43  ALKPHOS 68 73 65 66  BILITOT 0.9 1.2 1.1 1.1  PROT 5.4* 5.3* 5.1* 5.3*  ALBUMIN 2.4* 2.3* 2.1* 2.2*   No results for input(s): "LIPASE", "AMYLASE" in the last 168 hours. No results for input(s): "AMMONIA" in the last 168 hours. Coagulation Profile: No results for input(s): "INR", "PROTIME" in the last 168 hours. Cardiac Enzymes: Recent Labs  Lab 10/27/22 0527 11/02/22 0429  CKTOTAL 104 113   BNP (last 3 results) No results for input(s): "PROBNP" in the last 8760 hours. HbA1C: No results for input(s): "HGBA1C" in the last 72 hours. CBG: No results for input(s): "GLUCAP" in the last 168 hours. Lipid Profile: No results for input(s): "CHOL", "HDL", "LDLCALC", "TRIG", "CHOLHDL", "LDLDIRECT" in the last 72 hours. Thyroid Function Tests: No results for input(s):  "TSH", "T4TOTAL", "FREET4", "T3FREE", "THYROIDAB" in the last 72 hours.  Anemia Panel: No results for input(s): "VITAMINB12", "FOLATE", "FERRITIN", "TIBC", "IRON", "RETICCTPCT" in the last 72 hours.  Sepsis Labs: No results for input(s): "PROCALCITON", "LATICACIDVEN" in the last 168 hours.  Recent Results (from the past 240 hour(s))  Surgical pcr screen     Status: Abnormal   Collection Time: 10/23/22  7:44 AM   Specimen: Nasal Mucosa; Nasal Swab  Result Value Ref Range Status   MRSA, PCR POSITIVE (A) NEGATIVE Final    Comment: RESULT CALLED TO, READ BACK BY AND VERIFIED WITH: RN MIA RINAL [phone] AT 1029 BY EC    Staphylococcus aureus POSITIVE (A) NEGATIVE Final    Comment: (NOTE) The Xpert SA Assay (FDA approved for NASAL specimens in patients 68 years of age and older), is one component of a comprehensive surveillance program. It is not intended to diagnose infection nor to guide or monitor treatment. Performed at [facility], [address], [place], [place] [address]   Culture, blood (Routine X 2) w Reflex to ID Panel     Status: Abnormal (Preliminary result)   Collection Time: 10/23/22 12:24 PM   Specimen: BLOOD  Result Value Ref Range Status   Specimen Description BLOOD RIGHT ANTECUBITAL  Final   Special Requests Blood Culture adequate volume  Final   Culture  Setup Time   Final    GRAM POSITIVE COCCI ANAEROBIC BOTTLE ONLY CRITICAL RESULT CALLED TO, READ BACK BY AND VERIFIED WITH: K HURTH,PHARMD@[id number]  10/27/22 MK    Culture (A)  Final    METHICILLIN RESISTANT STAPHYLOCOCCUS AUREUS Sent to [facility] for further susceptibility testing. Performed at [facility], [address] [place], [place] [address]    Report Status PENDING  Incomplete   Organism ID, Bacteria METHICILLIN RESISTANT STAPHYLOCOCCUS AUREUS  Final      Susceptibility   Methicillin resistant staphylococcus aureus - MIC*    CIPROFLOXACIN >=8 RESISTANT Resistant     ERYTHROMYCIN <=0.25  SENSITIVE Sensitive     GENTAMICIN <=0.5 SENSITIVE Sensitive     OXACILLIN >=4 RESISTANT Resistant     TETRACYCLINE <=1 SENSITIVE Sensitive     VANCOMYCIN 1 SENSITIVE Sensitive     TRIMETH/SULFA <=10 SENSITIVE Sensitive     CLINDAMYCIN <=0.25 SENSITIVE Sensitive     RIFAMPIN <=  0.5 SENSITIVE Sensitive     Inducible Clindamycin NEGATIVE Sensitive     LINEZOLID 2 SENSITIVE Sensitive     * METHICILLIN RESISTANT STAPHYLOCOCCUS AUREUS  Culture, blood (Routine X 2) w Reflex to ID Panel     Status: None   Collection Time: 10/23/22 12:24 PM   Specimen: BLOOD  Result Value Ref Range Status   Specimen Description BLOOD BLOOD RIGHT FOREARM  Final   Special Requests   Final    BOTTLES DRAWN AEROBIC AND ANAEROBIC Blood Culture adequate volume   Culture   Final    NO GROWTH 5 DAYS Performed at [facility], [address] [place], [place] [address]    Report Status 10/28/2022 FINAL  Final  Blood Culture ID Panel (Reflexed)     Status: Abnormal   Collection Time: 10/23/22 12:24 PM  Result Value Ref Range Status   Enterococcus faecalis NOT DETECTED NOT DETECTED Final   Enterococcus Faecium NOT DETECTED NOT DETECTED Final   Listeria monocytogenes NOT DETECTED NOT DETECTED Final   Staphylococcus species DETECTED (A) NOT DETECTED Final    Comment: CRITICAL RESULT CALLED TO, READ BACK BY AND VERIFIED WITH: K HURTH,PHARMD@0354  10/27/22 MK    Staphylococcus aureus (BCID) DETECTED (A) NOT DETECTED Final    Comment: Methicillin (oxacillin)-resistant Staphylococcus aureus (MRSA). MRSA is predictably resistant to beta-lactam antibiotics (except ceftaroline). Preferred therapy is vancomycin unless clinically contraindicated. Patient requires contact precautions if  hospitalized. CRITICAL RESULT CALLED TO, READ BACK BY AND VERIFIED WITH: K HURTH,PHARMD@[email or web address]  10/27/22 MK    Staphylococcus epidermidis NOT DETECTED NOT DETECTED Final   Staphylococcus lugdunensis NOT DETECTED NOT DETECTED Final    Streptococcus species NOT DETECTED NOT DETECTED Final   Streptococcus agalactiae NOT DETECTED NOT DETECTED Final   Streptococcus pneumoniae NOT DETECTED NOT DETECTED Final   Streptococcus pyogenes NOT DETECTED NOT DETECTED Final   A.calcoaceticus-baumannii NOT DETECTED NOT DETECTED Final   Bacteroides fragilis NOT DETECTED NOT DETECTED Final   Enterobacterales NOT DETECTED NOT DETECTED Final   Enterobacter cloacae complex NOT DETECTED NOT DETECTED Final   Escherichia coli NOT DETECTED NOT DETECTED Final   Klebsiella aerogenes NOT DETECTED NOT DETECTED Final   Klebsiella oxytoca NOT DETECTED NOT DETECTED Final   Klebsiella pneumoniae NOT DETECTED NOT DETECTED Final   Proteus species NOT DETECTED NOT DETECTED Final   Salmonella species NOT DETECTED NOT DETECTED Final   Serratia marcescens NOT DETECTED NOT DETECTED Final   Haemophilus influenzae NOT DETECTED NOT DETECTED Final   Neisseria meningitidis NOT DETECTED NOT DETECTED Final   Pseudomonas aeruginosa NOT DETECTED NOT DETECTED Final   Stenotrophomonas maltophilia NOT DETECTED NOT DETECTED Final   Candida albicans NOT DETECTED NOT DETECTED Final   Candida auris NOT DETECTED NOT DETECTED Final   Candida glabrata NOT DETECTED NOT DETECTED Final   Candida krusei NOT DETECTED NOT DETECTED Final   Candida parapsilosis NOT DETECTED NOT DETECTED Final   Candida tropicalis NOT DETECTED NOT DETECTED Final   Cryptococcus neoformans/gattii NOT DETECTED NOT DETECTED Final   Meth resistant mecA/C and MREJ DETECTED (A) NOT DETECTED Final    Comment: CRITICAL RESULT CALLED TO, READ BACK BY AND VERIFIED WITH: K HURTH,PHARMD@[email or web address]  10/27/22 MK Performed at [facility], [address] [place], [place] [address]   Aerobic/Anaerobic Culture w Gram Stain (surgical/deep wound)     Status: None   Collection Time: 10/25/22 12:35 PM   Specimen: Path fluid; Tissue  Result Value Ref Range Status   Specimen Description FLUID  Final  Special Requests  infected bilateral femoral graft  Final   Gram Stain   Final    ABUNDANT WBC PRESENT, PREDOMINANTLY PMN ABUNDANT GRAM POSITIVE COCCI IN PAIRS IN CLUSTERS    Culture   Final    MODERATE STAPHYLOCOCCUS AUREUS NO ANAEROBES ISOLATED Performed at [facility], [address], [place], [place] [address]    Report Status 10/30/2022 FINAL  Final   Organism ID, Bacteria STAPHYLOCOCCUS AUREUS  Final      Susceptibility   Staphylococcus aureus - MIC*    CIPROFLOXACIN >=8 RESISTANT Resistant     ERYTHROMYCIN <=0.25 SENSITIVE Sensitive     GENTAMICIN <=0.5 SENSITIVE Sensitive     OXACILLIN >=4 RESISTANT Resistant     TETRACYCLINE <=1 SENSITIVE Sensitive     VANCOMYCIN <=0.5 SENSITIVE Sensitive     TRIMETH/SULFA <=10 SENSITIVE Sensitive     CLINDAMYCIN <=0.25 SENSITIVE Sensitive     RIFAMPIN <=0.5 SENSITIVE Sensitive     Inducible Clindamycin NEGATIVE Sensitive     LINEZOLID 2 SENSITIVE Sensitive     * MODERATE STAPHYLOCOCCUS AUREUS  Culture, blood (Routine X 2) w Reflex to ID Panel     Status: Abnormal   Collection Time: 10/27/22  2:46 PM   Specimen: BLOOD RIGHT HAND  Result Value Ref Range Status   Specimen Description BLOOD RIGHT HAND  Final   Special Requests   Final    BOTTLES DRAWN AEROBIC AND ANAEROBIC Blood Culture adequate volume   Culture  Setup Time   Final    GRAM POSITIVE COCCI IN BOTH AEROBIC AND ANAEROBIC BOTTLES CRITICAL VALUE NOTED.  VALUE IS CONSISTENT WITH PREVIOUSLY REPORTED AND CALLED VALUE.    Culture (A)  Final    STAPHYLOCOCCUS AUREUS SUSCEPTIBILITIES PERFORMED ON PREVIOUS CULTURE WITHIN THE LAST 5 DAYS. Performed at [facility], [address], [place], [place] [address]    Report Status 10/31/2022 FINAL  Final  Culture, blood (Routine X 2) w Reflex to ID Panel     Status: None   Collection Time: 10/27/22  2:47 PM   Specimen: BLOOD RIGHT ARM  Result Value Ref Range Status   Specimen Description BLOOD RIGHT ARM  Final   Special Requests   Final     BOTTLES DRAWN AEROBIC AND ANAEROBIC Blood Culture adequate volume   Culture   Final    NO GROWTH 5 DAYS Performed at [facility], [address] [place], [place] [address]    Report Status 11/01/2022 FINAL  Final  Culture, blood (Routine X 2) w Reflex to ID Panel     Status: None (Preliminary result)   Collection Time: 10/29/22 11:33 AM   Specimen: BLOOD LEFT ARM  Result Value Ref Range Status   Specimen Description BLOOD LEFT ARM  Final   Special Requests   Final    BOTTLES DRAWN AEROBIC AND ANAEROBIC Blood Culture adequate volume   Culture   Final    NO GROWTH 4 DAYS Performed at [facility], [address], [place], [place] [address]    Report Status PENDING  Incomplete  Culture, blood (Routine X 2) w Reflex to ID Panel     Status: None (Preliminary result)   Collection Time: 10/29/22 11:35 AM   Specimen: BLOOD  Result Value Ref Range Status   Specimen Description BLOOD RIGHT ANTECUBITAL  Final   Special Requests   Final    BOTTLES DRAWN AEROBIC AND ANAEROBIC Blood Culture adequate volume   Culture   Final  NO GROWTH 4 DAYS Performed at [facility], [address], [place], [place] [address]    Report Status PENDING  Incomplete  Minimum Inhibitory Conc. (1 Drug)     Status: None   Collection Time: 10/29/22 11:35 AM  Result Value Ref Range Status   Min Inhibitory Conc (1 Drug) Final report  Corrected    Comment: (NOTE) Performed At: [facility] [address] [place], [place] [id number] Jolene Schimke MD KG:[id number] CORRECTED ON 08/22 AT 1536: PREVIOUSLY REPORTED AS Preliminary report    Source CRE SAUR BLOOD  Final    Comment: Performed at [facility], [address] [place], [place] [address]         Radiology Studies: PERIPHERAL VASCULAR CATHETERIZATION  Result Date: 10/31/2022 See surgical note for result.       Scheduled Meds:  sodium chloride   Intravenous Once   aspirin EC  81 mg Oral Daily    clopidogrel  75 mg Oral Daily   docusate sodium  100 mg Oral Daily   fluticasone  2 spray Each Nare Daily   guaiFENesin  1,200 mg Oral BID   heparin  5,000 Units Subcutaneous Q8H   multivitamin with minerals  1 tablet Oral Daily   pantoprazole  40 mg Oral Daily   phosphorus  250 mg Oral BID   sodium chloride flush  3 mL Intravenous Q12H   sodium chloride  1 g Oral TID WC   Continuous Infusions:  sodium chloride     sodium chloride     DAPTOmycin (CUBICIN) 600 mg in sodium chloride 0.9 % IVPB 600 mg (11/01/22 1407)   lactated ringers            Glade Lloyd, MD [facility] 11/02/2022, 7:40 AM

## 2022-11-02 NOTE — Progress Notes (Signed)
Occupational Therapy Treatment Patient Details Name: Edward Campos MRN: [id number] DOB: 1932-11-07 Today's Date: 11/02/2022   History of present illness Pt is a 89 y.o. male admitted to [facility] on 10/22/2022 with Infected right to left femoral-femoral Dacron bypass graft. On 10/25/22, pt underwent femoral bypass graft excision with Left common femoral vein primary repair, redo of femorofemoral bypass, Right common femoral to left profunda with interposition bypass to the superficial femoral artery, myriad Morcel placement, and Bilateral Prevena wound VAC placement.  Developed L groin seroma and on 8/21 pt underwent direct repair of R common femoral a. Pseudoaneurysm, I&D L groin seroma, L sartorius flap to L groin and R sartorius flap to R groin.  Recent hospitalizations in June for infected right to left femoral to femoral bypass graft and in July 2024 with a postobstructive pneumonia. PMH significant for HLD, PVD s/p bypass grafting, AAA, SCLC w/bony mets(diagnosed June 2024).   OT comments  OT educated pt in techniques for increased safety and independence with ADLs and safe functional transfers/mobility. OT goals reassessed this day secondary to pt now post R common femoral artery repair with all goals remaining appropriate and with pt largely at similar functional level as last skilled OT session with pt now requiring mildly increased assistance for LB ADLs secondary to placement of wound vacs. Pt currently demonstrating ability to complete UB ADLs Independent to Contact guard assist, LB ADLs with Max assist, and functional mobility/transfers with a RW with Min assist. Pt will benefit from continued acute skilled OT services to address deficits outlined below and increase safety and independence with ADLs. Post acute discharge, pt will benefit from intensive inpatient skilled rehab services < 3 hours per day to maximize rehab potential.       If plan is discharge home, recommend the following:  A little  help with walking and/or transfers;A lot of help with bathing/dressing/bathroom;Assistance with cooking/housework;Assist for transportation;Help with stairs or ramp for entrance   Equipment Recommendations  Other (comment) (defer to next level of care)    Recommendations for Other Services      Precautions / Restrictions Precautions Precautions: Fall Precaution Comments: B groin wound vacs       Mobility Bed Mobility Overal bed mobility: Needs Assistance Bed Mobility: Supine to Sit     Supine to sit: Min assist     General bed mobility comments: increased time with lines and assist for trunk upright    Transfers Overall transfer level: Needs assistance Equipment used: Rolling walker (2 wheels) Transfers: Sit to/from Stand Sit to Stand: Min assist, From elevated surface           General transfer comment: up to stand with assist for balance and lines; but up higher to ease transition     Balance Overall balance assessment: Needs assistance Sitting-balance support: Single extremity supported, No upper extremity supported, Feet supported Sitting balance-Leahy Scale: Fair     Standing balance support: Bilateral upper extremity supported, During functional activity, Reliant on assistive device for balance Standing balance-Leahy Scale: Poor                             ADL either performed or assessed with clinical judgement   ADL Overall ADL's : Needs assistance/impaired Eating/Feeding: Independent;Sitting   Grooming: Contact guard assist;Standing   Upper Body Bathing: Contact guard assist;Sitting   Lower Body Bathing: Maximal assistance;Sit to/from stand Lower Body Bathing Details (indicate cue type and reason): Additional assitance required this  session as compared to last secondary to wound vac placement Upper Body Dressing : Contact guard assist (assistance with managing telemetry cords, IV lines, and wound vac)   Lower Body Dressing: Maximal  assistance;Sit to/from stand Lower Body Dressing Details (indicate cue type and reason): Additional assitance required this session as compared to last secondary to wound vac placement Toilet Transfer: Minimal assistance;Rolling walker (2 wheels);BSC/3in1           Functional mobility during ADLs: Minimal assistance;Rolling walker (2 wheels)      Extremity/Trunk Assessment Upper Extremity Assessment Upper Extremity Assessment: Overall WFL for tasks assessed;Right hand dominant   Lower Extremity Assessment Lower Extremity Assessment: Defer to PT evaluation        Vision       Perception     Praxis      Cognition Arousal: Alert Behavior During Therapy: [facility] for tasks assessed/performed Overall Cognitive Status: Within Functional Limits for tasks assessed                                 General Comments: Pt AAOx4, able to follow multi-step commands consistantly, and pleasant throughout session.        Exercises      Shoulder Instructions       General Comments VSS on RA throughout session. Daughter present throughout session.    Pertinent Vitals/ Pain       Pain Assessment Pain Assessment: Faces Faces Pain Scale: Hurts little more Pain Location: B groin wounds Pain Descriptors / Indicators: Discomfort Pain Intervention(s): Monitored during session, Repositioned  Home Living                                          Prior Functioning/Environment              Frequency  Min 1X/week        Progress Toward Goals  OT Goals(current goals can now be found in the care plan section)  Progress towards OT goals: Progressing toward goals  Acute Rehab OT Goals Patient Stated Goal: To be independent and not need any more surgeries OT Goal Formulation: With patient Time For Goal Achievement: 11/16/22 Potential to Achieve Goals: Good  Plan      Co-evaluation    PT/OT/SLP Co-Evaluation/Treatment: Yes Reason for  Co-Treatment: To address functional/ADL transfers;Complexity of the patient's impairments (multi-system involvement) PT goals addressed during session: Mobility/safety with mobility;Balance;Proper use of DME        AM-PAC OT "6 Clicks" Daily Activity     Outcome Measure   Help from another person eating meals?: None Help from another person taking care of personal grooming?: A Little Help from another person toileting, which includes using toliet, bedpan, or urinal?: A Little Help from another person bathing (including washing, rinsing, drying)?: A Lot Help from another person to put on and taking off regular upper body clothing?: A Little Help from another person to put on and taking off regular lower body clothing?: A Lot 6 Click Score: 17    End of Session Equipment Utilized During Treatment: Gait belt;Rolling walker (2 wheels);Other (comment) (wound vac)  OT Visit Diagnosis: Unsteadiness on feet (R26.81);Other (comment) (decreased activity tolerance)   Activity Tolerance Patient tolerated treatment well   Patient Left in chair;with call bell/phone within reach;with chair alarm set;with family/visitor present   Nurse  Communication Mobility status        Time: 1610-9604 OT Time Calculation (min): 25 min  Charges: OT General Charges $OT Visit: 1 Visit OT Treatments $Self Care/Home Management : 8-22 mins  Cabela Pacifico "Orson Eva., OTR/L, MA Acute Rehab [phone]   Lendon Colonel 11/02/2022, 5:53 PM

## 2022-11-02 NOTE — Progress Notes (Signed)
Physical Therapy Treatment Patient Details Name: Edward Campos MRN: [id number] DOB: 08/02/1932 Today's Date: 11/02/2022   History of Present Illness Pt is a 89 y.o. male admitted to [facility] on 10/22/2022 with Infected right to left femoral-femoral Dacron bypass graft. On 10/25/22, pt underwent femoral bypass graft excision with Left common femoral vein primary repair, redo of femorofemoral bypass, Right common femoral to left profunda with interposition bypass to the superficial femoral artery, myriad Morcel placement, and Bilateral Prevena wound VAC placement.  Developed L groin seroma and on 8/21 pt underwent direct repair of R common femoral a. Pseudoaneurysm, I&D L groin seroma, L sartorius flap to L groin and R sartorius flap to R groin.  Recent hospitalizations in June for infected right to left femoral to femoral bypass graft and in July 2024 with a postobstructive pneumonia. PMH significant for HLD, PVD s/p bypass grafting, AAA, SCLC w/bony mets(diagnosed June 2024).    PT Comments  Patient seen for reassessment following above procedure and days of bedrest, though mobility remains close to previous to procedure.  He needed help due to heavy wound vac on front of the walker with flexed posture so concern for anterior LOB, but mobilizing in hallway with min A.  Patient's daughter present and supportive, but unable to stay with him after hospitalization so remains most appropriate for inpatient rehab > 3 hours/day as will need 6 weeks of IV antibiotics.  PT will continue to follow in the acute setting.    If plan is discharge home, recommend the following: A little help with walking and/or transfers;A little help with bathing/dressing/bathroom;Assistance with cooking/housework;Assist for transportation;Help with stairs or ramp for entrance   Can travel by private vehicle     Yes  Equipment Recommendations  Rolling walker (2 wheels)    Recommendations for Other Services       Precautions /  Restrictions Precautions Precautions: Fall Precaution Comments: B groin wound vacs     Mobility  Bed Mobility Overal bed mobility: Needs Assistance Bed Mobility: Supine to Sit     Supine to sit: Min assist     General bed mobility comments: increased time with lines and assist for trunk upright    Transfers Overall transfer level: Needs assistance Equipment used: Rolling walker (2 wheels) Transfers: Sit to/from Stand Sit to Stand: Min assist, From elevated surface           General transfer comment: up to stand with assist for balance and lines; but up higher to ease transition    Ambulation/Gait Ambulation/Gait assistance: Min assist Gait Distance (Feet): 200 Feet Assistive device: Rolling walker (2 wheels) Gait Pattern/deviations: Step-through pattern, Decreased stride length, Trunk flexed, Shuffle, Wide base of support       General Gait Details: decreased foot clearance, assist for balance and safety with +2 for lines and if chair needed   Stairs             Wheelchair Mobility     Tilt Bed    Modified Rankin (Stroke Patients Only)       Balance Overall balance assessment: Needs assistance   Sitting balance-Leahy Scale: Fair     Standing balance support: Bilateral upper extremity supported, Reliant on assistive device for balance Standing balance-Leahy Scale: Poor                              Cognition Arousal: Alert Behavior During Therapy: WFL for tasks assessed/performed Overall Cognitive Status: Within Functional  Limits for tasks assessed                                 General Comments: Pt AAOx4, able to follow multi-step commands consistantly, and pleasant throughout session.        Exercises      General Comments General comments (skin integrity, edema, etc.): VSS with mobility, daughter in the room and supportive      Pertinent Vitals/Pain Pain Assessment Pain Assessment: Faces Faces Pain  Scale: Hurts little more Pain Location: B groin wounds Pain Descriptors / Indicators: Discomfort Pain Intervention(s): Monitored during session, Repositioned    Home Living                          Prior Function            PT Goals (current goals can now be found in the care plan section) Acute Rehab PT Goals Patient Stated Goal: rehab, get better PT Goal Formulation: With patient/family Time For Goal Achievement: 11/16/22 Potential to Achieve Goals: Good Progress towards PT goals: Progressing toward goals;Goals updated    Frequency    Min 1X/week      PT Plan      Co-evaluation PT/OT/SLP Co-Evaluation/Treatment: Yes Reason for Co-Treatment: To address functional/ADL transfers;Complexity of the patient's impairments (multi-system involvement) PT goals addressed during session: Mobility/safety with mobility;Balance;Proper use of DME        AM-PAC PT "6 Clicks" Mobility   Outcome Measure  Help needed turning from your back to your side while in a flat bed without using bedrails?: A Little Help needed moving from lying on your back to sitting on the side of a flat bed without using bedrails?: A Little Help needed moving to and from a bed to a chair (including a wheelchair)?: A Little Help needed standing up from a chair using your arms (e.g., wheelchair or bedside chair)?: A Little Help needed to walk in hospital room?: A Little Help needed climbing 3-5 steps with a railing? : Total 6 Click Score: 16    End of Session Equipment Utilized During Treatment: Gait belt Activity Tolerance: Patient tolerated treatment well Patient left: in chair;with call bell/phone within reach;with family/visitor present Nurse Communication: Mobility status PT Visit Diagnosis: Difficulty in walking, not elsewhere classified (R26.2);Muscle weakness (generalized) (M62.81)     Time: 1610-9604 PT Time Calculation (min) (ACUTE ONLY): 24 min  Charges:    $Gait Training:  8-22 mins PT General Charges $$ ACUTE PT VISIT: 1 Visit                     Sheran Lawless, PT Acute Rehabilitation Services Office:[phone] 11/02/2022    Edward Campos 11/02/2022, 5:38 PM

## 2022-11-02 NOTE — Plan of Care (Signed)
  Problem: Education: Goal: Knowledge of General Education information will improve Description: Including pain rating scale, medication(s)/side effects and non-pharmacologic comfort measures Outcome: Progressing   Problem: Activity: Goal: Risk for activity intolerance will decrease Outcome: Progressing   Problem: Nutrition: Goal: Adequate nutrition will be maintained Outcome: Progressing   

## 2022-11-03 DIAGNOSIS — A4102 Sepsis due to Methicillin resistant Staphylococcus aureus: Secondary | ICD-10-CM | POA: Diagnosis not present

## 2022-11-03 LAB — BASIC METABOLIC PANEL
Anion gap: 8 (ref 5–15)
BUN: 17 mg/dL (ref 8–23)
CO2: 24 mmol/L (ref 22–32)
Calcium: 7.6 mg/dL — ABNORMAL LOW (ref 8.9–10.3)
Chloride: 96 mmol/L — ABNORMAL LOW (ref 98–111)
Creatinine, Ser: 0.8 mg/dL (ref 0.61–1.24)
GFR, Estimated: >60 mL/min (ref >60)
Glucose, Bld: 102 mg/dL — ABNORMAL HIGH (ref 70–99)
Potassium: 3.8 mmol/L (ref 3.5–5.1)
Sodium: 128 mmol/L — ABNORMAL LOW (ref 135–145)

## 2022-11-03 LAB — CBC
HCT: 25.1 % — ABNORMAL LOW (ref 39.0–52.0)
Hemoglobin: 8.4 g/dL — ABNORMAL LOW (ref 13.0–17.0)
MCH: 29.9 pg (ref 26.0–34.0)
MCHC: 33.5 g/dL (ref 30.0–36.0)
MCV: 89.3 fL (ref 80.0–100.0)
Platelets: 367 10*3/uL (ref 150–400)
RBC: 2.81 MIL/uL — ABNORMAL LOW (ref 4.22–5.81)
RDW: 17.3 % — ABNORMAL HIGH (ref 11.5–15.5)
WBC: 9 10*3/uL (ref 4.0–10.5)
nRBC: 0 % (ref 0.0–0.2)

## 2022-11-03 LAB — CULTURE, BLOOD (ROUTINE X 2)
Culture: NO GROWTH
Culture: NO GROWTH
Special Requests: ADEQUATE
Special Requests: ADEQUATE

## 2022-11-03 NOTE — Progress Notes (Signed)
PROGRESS NOTE    Edward Campos  NWG:[id number] DOB: 02-Aug-1932 DOA: 10/22/2022 PCP: Patient, No Pcp Per   Brief Narrative:  89 y.o. male with past medical history of HLD, PVD s/p bypass grafting, AAA, SCLC hospitalization in July and 24 for infected right to left femoral to femoral bypass graft requiring temporary wound VAC placement who presented as a direct admission to the vascular service for concern a infected femoral bypass with fevers at home. CT angiogram of the abdomen pelvis noted right bifemoral bypass graft with fluid/inflammatory changes associated with the femorofemoral bypass most prominent on the left compatible with a graft infection, and a 5.8 infrarenal abdominal aortic aneurysm grossly unchanged in size.  He was started on broad-spectrum antibiotics.  Subsequently, [facility] took over as primary team.  He underwent vascular surgery intervention with wound VAC placement on 10/25/2022.  Blood cultures on 10/23/2022 grew MRSA.  ID consulted and currently following: Currently on IV daptomycin.  Patient underwent direct repair of right common femoral artery pseudoaneurysm; I&D of left groin seroma, left sartorius flap to left groin and right sartorius flap to right groin by vascular surgery on 10/31/2022.  Assessment & Plan:   Sepsis: Present on admission Infected left femoral graft MRSA bacteremia -Currently on daptomycin as per ID.  Patient will need at least 6 weeks of antibiotics as per ID.  Repeat blood cultures have been negative so far. -2D echo showed no vegetation.  ID is recommending TEE. -Patient underwent vascular surgery intervention with wound VAC placement on 10/25/2022.   -Currently afebrile and hemodynamically stable -underwent direct repair of right common femoral artery pseudoaneurysm; I&D of left groin seroma, left sartorius flap to left groin and right sartorius flap to right groin by vascular surgery on 10/31/2022.  Wound and VAC care as per vascular surgery.  Acute on  chronic hyponatremia -Baseline sodium of 128-131.  Presented with sodium of 122. -Sodium 128 this morning.  Off IV fluids.  Continue salt tablets.  Continue fluid restriction.  Repeat a.m. labs.  Small cell cancer with bony metastases Goals of care -Noted started on chemotherapy because of issues with wound not healing.  Outpatient follow-up with oncology -Palliative care following.  Currently listed as full code.  Overall prognosis is guarded to poor.  Leukocytosis -Resolved  Anemia of chronic disease -Hemoglobin currently stable.  Monitor intermittently  Hypokalemia -Resolved  Elevated LFTs -Resolved.  Crestor has been discontinued  Hyperlipidemia -Crestor on hold.  Possibly resume on discharge  PAD -Continue aspirin.  Statin on hold.  Continue Plavix  AAA --Patient with 5.8 x 5.7 cm infrarenal abdominal aortic aneurysm unchanged in size. -Vascular surgery following.  Physical deconditioning -PT following and recommending SNF.     DVT prophylaxis: Heparin subcutaneous Code Status: Full Family Communication: Daughter on phone Disposition Plan: Status is: Inpatient Remains inpatient appropriate because: Of severity of illness  Consultants: Vascular surgery/palliative care/ID  Procedures: As above  Antimicrobials:  Anti-infectives (From admission, onward)    Start     Dose/Rate Route Frequency Ordered Stop   10/26/22 0945  DAPTOmycin (CUBICIN) 600 mg in sodium chloride 0.9 % IVPB        8 mg/kg  75.9 kg 124 mL/hr over 30 Minutes Intravenous Daily 10/26/22 0855     10/26/22 0945  cefTRIAXone (ROCEPHIN) 2 g in sodium chloride 0.9 % 100 mL IVPB  Status:  Discontinued        2 g 200 mL/hr over 30 Minutes Intravenous Every 24 hours 10/26/22 0855 10/26/22 0905  10/25/22 1006  ceFAZolin 1 g / gentamicin 80 mg in NS 500 mL surgical irrigation  Status:  Discontinued          As needed 10/25/22 1006 10/25/22 1318   10/23/22 1145  vancomycin (VANCOREADY) IVPB 1250  mg/250 mL  Status:  Discontinued        1,250 mg 166.7 mL/hr over 90 Minutes Intravenous Every 24 hours 10/23/22 1051 10/26/22 0855   10/23/22 1100  ceFEPIme (MAXIPIME) 2 g in sodium chloride 0.9 % 100 mL IVPB  Status:  Discontinued        2 g 200 mL/hr over 30 Minutes Intravenous Every 12 hours 10/23/22 1041 10/26/22 0855        Subjective: Patient seen and examined at bedside.  No fever, shortness of breath, chest pain or vomiting reported.   Objective: Vitals:   11/02/22 1921 11/02/22 2326 11/03/22 0305 11/03/22 0718  BP: (!) 142/81 136/74 134/76 (!) 114/101  Pulse: 60 87 89 (!) 107  Resp: 16 18 13 19   Temp: 97.8 F (36.6 C) 97.7 F (36.5 C) 97.8 F (36.6 C) 97.6 F (36.4 C)  TempSrc: Oral Oral Oral Axillary  SpO2: 100% 100% 100% 99%  Weight:   73.2 kg   Height:        Intake/Output Summary (Last 24 hours) at 11/03/2022 0745 Last data filed at 11/03/2022 0643 Gross per 24 hour  Intake 240 ml  Output 1536 ml  Net -1296 ml   Filed Weights   10/25/22 0651 10/31/22 1453 11/03/22 0305  Weight: 75.9 kg 76 kg 73.2 kg    Examination:  General: On room air.  No distress.  Chronically ill and deconditioned looking. ENT/neck: No obvious thyromegaly or elevated JVD noted respiratory: Decreased breath sounds at bases bilaterally with some crackles  CVS: S1-S2 heard; rate controlled currently Abdominal: Soft, nontender, still slightly distended; no organomegaly, bowel sounds heard normally Extremities: Bilateral lower extremity edema present; no cyanosis  CNS: Awake and alert.  Poor historian; still slow to respond.  No obvious focal deficits noted  lymph: No obvious lymphadenopathy noted Skin: Bilateral groins dressing present psych: Mostly flat affect.  Currently not agitated. Musculoskeletal: No obvious joint swelling/tenderness   Data Reviewed: I have personally reviewed following labs and imaging studies  CBC: Recent Labs  Lab 10/29/22 0237 10/29/22 1133  10/30/22 0327 10/30/22 1246 10/31/22 0701 10/31/22 1715 11/01/22 0415 11/02/22 0429 11/03/22 0408  WBC 13.5*   < > 11.4*  --  9.6  --  15.0* 9.1 9.0  NEUTROABS 12.6*  --  8.9*  --  7.2  --  12.9*  --   --   HGB 8.5*   < > 8.1*   < > 7.5* 6.5* 8.8* 8.5* 8.4*  HCT 24.1*   < > 22.6*   < > 22.0* 19.0* 25.5* 24.9* 25.1*  MCV 85.5   < > 84.6  --  85.9  --  85.0 86.5 89.3  PLT 321   < > 312  --  353  --  342 373 367   < > = values in this interval not displayed.   Basic Metabolic Panel: Recent Labs  Lab 10/28/22 0230 10/29/22 0237 10/29/22 1133 10/30/22 0327 10/31/22 0701 10/31/22 1715 11/01/22 0415 11/02/22 0429 11/03/22 0408  NA 126* 125*   < > 126* 128* 129* 127* 126* 128*  K 3.3* 4.0   < > 4.5 3.6 3.8 4.2 4.5 3.8  CL 94* 94*   < > 96* 96*  --  97* 94* 96*  CO2 21* 24   < > 19* 22  --  20* 24 24  GLUCOSE 123* 103*   < > 82 96  --  104* 114* 102*  BUN 10 12   < > 13 7*  --  14 20 17   CREATININE 0.74 0.82   < > 0.86 0.69  --  0.82 0.95 0.80  CALCIUM 7.5* 7.6*   < > 7.4* 7.5*  --  7.6* 7.5* 7.6*  MG 1.9 1.8  --   --  1.7  --  1.7 1.9  --   PHOS 2.4* 2.4*  --   --  2.3*  --   --   --   --    < > = values in this interval not displayed.   GFR: Estimated Creatinine Clearance: 65.1 mL/min (by C-G formula based on SCr of 0.8 mg/dL). Liver Function Tests: Recent Labs  Lab 10/28/22 0230 10/29/22 0237 10/31/22 0701  AST 37 32 29  ALT 64* 53* 43  ALKPHOS 73 65 66  BILITOT 1.2 1.1 1.1  PROT 5.3* 5.1* 5.3*  ALBUMIN 2.3* 2.1* 2.2*   No results for input(s): "LIPASE", "AMYLASE" in the last 168 hours. No results for input(s): "AMMONIA" in the last 168 hours. Coagulation Profile: No results for input(s): "INR", "PROTIME" in the last 168 hours. Cardiac Enzymes: Recent Labs  Lab 11/02/22 0429  CKTOTAL 113   BNP (last 3 results) No results for input(s): "PROBNP" in the last 8760 hours. HbA1C: No results for input(s): "HGBA1C" in the last 72 hours. CBG: No results for  input(s): "GLUCAP" in the last 168 hours. Lipid Profile: No results for input(s): "CHOL", "HDL", "LDLCALC", "TRIG", "CHOLHDL", "LDLDIRECT" in the last 72 hours. Thyroid Function Tests: No results for input(s): "TSH", "T4TOTAL", "FREET4", "T3FREE", "THYROIDAB" in the last 72 hours.  Anemia Panel: No results for input(s): "VITAMINB12", "FOLATE", "FERRITIN", "TIBC", "IRON", "RETICCTPCT" in the last 72 hours.  Sepsis Labs: No results for input(s): "PROCALCITON", "LATICACIDVEN" in the last 168 hours.  Recent Results (from the past 240 hour(s))  Aerobic/Anaerobic Culture w Gram Stain (surgical/deep wound)     Status: None   Collection Time: 10/25/22 12:35 PM   Specimen: Path fluid; Tissue  Result Value Ref Range Status   Specimen Description FLUID  Final   Special Requests infected bilateral femoral graft  Final   Gram Stain   Final    ABUNDANT WBC PRESENT, PREDOMINANTLY PMN ABUNDANT GRAM POSITIVE COCCI IN PAIRS IN CLUSTERS    Culture   Final    MODERATE STAPHYLOCOCCUS AUREUS NO ANAEROBES ISOLATED Performed at [facility], [address], [place], [place] [address]    Report Status 10/30/2022 FINAL  Final   Organism ID, Bacteria STAPHYLOCOCCUS AUREUS  Final      Susceptibility   Staphylococcus aureus - MIC*    CIPROFLOXACIN >=8 RESISTANT Resistant     ERYTHROMYCIN <=0.25 SENSITIVE Sensitive     GENTAMICIN <=0.5 SENSITIVE Sensitive     OXACILLIN >=4 RESISTANT Resistant     TETRACYCLINE <=1 SENSITIVE Sensitive     VANCOMYCIN <=0.5 SENSITIVE Sensitive     TRIMETH/SULFA <=10 SENSITIVE Sensitive     CLINDAMYCIN <=0.25 SENSITIVE Sensitive     RIFAMPIN <=0.5 SENSITIVE Sensitive     Inducible Clindamycin NEGATIVE Sensitive     LINEZOLID 2 SENSITIVE Sensitive     * MODERATE STAPHYLOCOCCUS AUREUS  Culture, blood (Routine X 2) w Reflex to ID Panel     Status: Abnormal  Collection Time: 10/27/22  2:46 PM   Specimen: BLOOD RIGHT HAND  Result Value Ref Range Status   Specimen  Description BLOOD RIGHT HAND  Final   Special Requests   Final    BOTTLES DRAWN AEROBIC AND ANAEROBIC Blood Culture adequate volume   Culture  Setup Time   Final    GRAM POSITIVE COCCI IN BOTH AEROBIC AND ANAEROBIC BOTTLES CRITICAL VALUE NOTED.  VALUE IS CONSISTENT WITH PREVIOUSLY REPORTED AND CALLED VALUE.    Culture (A)  Final    STAPHYLOCOCCUS AUREUS SUSCEPTIBILITIES PERFORMED ON PREVIOUS CULTURE WITHIN THE LAST 5 DAYS. Performed at [facility], [address], [place], [place] [address]    Report Status 10/31/2022 FINAL  Final  Culture, blood (Routine X 2) w Reflex to ID Panel     Status: None   Collection Time: 10/27/22  2:47 PM   Specimen: BLOOD RIGHT ARM  Result Value Ref Range Status   Specimen Description BLOOD RIGHT ARM  Final   Special Requests   Final    BOTTLES DRAWN AEROBIC AND ANAEROBIC Blood Culture adequate volume   Culture   Final    NO GROWTH 5 DAYS Performed at [facility], [address] [place], [place] [address]    Report Status 11/01/2022 FINAL  Final  Culture, blood (Routine X 2) w Reflex to ID Panel     Status: None (Preliminary result)   Collection Time: 10/29/22 11:33 AM   Specimen: BLOOD LEFT ARM  Result Value Ref Range Status   Specimen Description BLOOD LEFT ARM  Final   Special Requests   Final    BOTTLES DRAWN AEROBIC AND ANAEROBIC Blood Culture adequate volume   Culture   Final    NO GROWTH 4 DAYS Performed at [facility], [address], [place], [place] [address]    Report Status PENDING  Incomplete  Culture, blood (Routine X 2) w Reflex to ID Panel     Status: None (Preliminary result)   Collection Time: 10/29/22 11:35 AM   Specimen: BLOOD  Result Value Ref Range Status   Specimen Description BLOOD RIGHT ANTECUBITAL  Final   Special Requests   Final    BOTTLES DRAWN AEROBIC AND ANAEROBIC Blood Culture adequate volume   Culture   Final    NO GROWTH 4 DAYS Performed at [facility], [address],  [place], [place] [address]    Report Status PENDING  Incomplete  Minimum Inhibitory Conc. (1 Drug)     Status: None   Collection Time: 10/29/22 11:35 AM  Result Value Ref Range Status   Min Inhibitory Conc (1 Drug) Final report  Corrected    Comment: (NOTE) Performed At: [facility] [address] [place], [place] [id number] Jolene Schimke MD MV:[id number] CORRECTED ON 08/22 AT 1536: PREVIOUSLY REPORTED AS Preliminary report    Source CRE SAUR BLOOD  Final    Comment: Performed at [facility], [address] [place], [place] [address]         Radiology Studies: No results found.      Scheduled Meds:  sodium chloride   Intravenous Once   aspirin EC  81 mg Oral Daily   clopidogrel  75 mg Oral Daily   docusate sodium  100 mg Oral Daily   fluticasone  2 spray Each Nare Daily   guaiFENesin  1,200 mg Oral BID   heparin  5,000 Units Subcutaneous Q8H   multivitamin with minerals  1 tablet Oral Daily   pantoprazole  40 mg Oral Daily   sodium chloride flush  3 mL Intravenous Q12H   sodium chloride  1 g Oral TID WC   Continuous Infusions:  sodium chloride     sodium chloride     DAPTOmycin (CUBICIN) 600 mg in sodium chloride 0.9 % IVPB 600 mg (11/02/22 1443)          Glade Lloyd, MD [facility] 11/03/2022, 7:45 AM

## 2022-11-03 NOTE — Progress Notes (Addendum)
  Progress Note    11/03/2022 9:58 AM 3 Days Post-Op  Subjective:  no complaints    Vitals:   11/03/22 0305 11/03/22 0718  BP: 134/76 (!) 114/101  Pulse: 89 (!) 107  Resp: 13 19  Temp: 97.8 F (36.6 C) 97.6 F (36.4 C)  SpO2: 100% 99%    Physical Exam: General:  resting comfortably in bed Cardiac:  regular Lungs:  nonlabored Incisions: Bilateral groin incisions with prevena vacs with good seal. Some bloody drainage within right prevena vac but still intact.  Extremities: Bilateral lower extremities warm and well-perfused. Right groin JP with 21cc output in 24 hours, I have removed this. Left groin JP with 355cc output in 24 hours, will maintain   CBC    Component Value Date/Time   WBC 9.0 11/03/2022 0408   RBC 2.81 (L) 11/03/2022 0408   HGB 8.4 (L) 11/03/2022 0408   HGB 13.9 10/27/2020 1038   HCT 25.1 (L) 11/03/2022 0408   HCT 41.0 10/27/2020 1038   PLT 367 11/03/2022 0408   PLT 208 10/27/2020 1038   MCV 89.3 11/03/2022 0408   MCV 90 10/27/2020 1038   MCH 29.9 11/03/2022 0408   MCHC 33.5 11/03/2022 0408   RDW 17.3 (H) 11/03/2022 0408   RDW 12.7 10/27/2020 1038   LYMPHSABS 0.6 (L) 11/01/2022 0415   MONOABS 0.9 11/01/2022 0415   EOSABS 0.0 11/01/2022 0415   BASOSABS 0.0 11/01/2022 0415    BMET    Component Value Date/Time   NA 128 (L) 11/03/2022 0408   NA 138 10/27/2020 1038   K 3.8 11/03/2022 0408   CL 96 (L) 11/03/2022 0408   CO2 24 11/03/2022 0408   GLUCOSE 102 (H) 11/03/2022 0408   BUN 17 11/03/2022 0408   BUN 17 10/27/2020 1038   CREATININE 0.80 11/03/2022 0408   CALCIUM 7.6 (L) 11/03/2022 0408   GFRNONAA >60 11/03/2022 0408   GFRAA >60 07/07/2019 0259    INR    Component Value Date/Time   INR 1.2 08/16/2022 0637     Intake/Output Summary (Last 24 hours) at 11/03/2022 0958 Last data filed at 11/03/2022 0800 Gross per 24 hour  Intake 240 ml  Output 1741 ml  Net -1501 ml      Assessment/Plan:  89 y.o. male is 3 days postop,  s/p: 1) direct repair of right common femoral artery pseudoaneurysm 2) incision and drainage of left groin seroma 3) left sartorius flap to left groin 4) right sartorius flap to right groin  10/25/2022:  Femoral-femoral bypass graft excision Left common femoral vein primary repair Redo femorofemoral bypass using cadaveric greater saphenous vein Right common femoral to left profunda with interposition bypass to the superficial femoral artery.   -No acute events overnight.  Leukocytosis still improved and stable at 9.0 -Hemodynamically stable with hemoglobin at 8.4 -Bilateral groins with Prevena vacs with good seal. Some bloody drainage within right Prevena VAC dressing, but this is still intact -Right groin JP drain with 21 cc output in 24 hours.  I have removed this.  Left groin JP drain with 355 cc in 24 hours, will keep until output is less than 100 cc -Bilateral lower extremities warm and well-perfused. -Cleared from vascular standpoint to mobilize with PT/OT   Loel Dubonnet, PA-C [facility] [phone] 11/03/2022 9:58 AM  I agree with the above.  I have seen and evaluated the patient.  His wound vacs remain with good seal.  Right JP was removed.  Durene Cal

## 2022-11-03 NOTE — Progress Notes (Addendum)
    [facility] has been requested to perform a transesophageal echocardiogram on 08/26 for bacteremia.  After careful review of history and examination, the risks and benefits of transesophageal echocardiogram have been explained including risks of esophageal damage, perforation (1:10,000 risk), bleeding, pharyngeal hematoma as well as other potential complications associated with conscious sedation including aspiration, arrhythmia, respiratory failure and death. Alternatives to treatment were discussed, questions were answered. Patient is willing to proceed.   He wishes to be scheduled for Tuesday since Monday schedule is full and he could only get done if there was a cancellation.  Orders written  Theodore Demark, PA-C 11/03/2022 2:56 PM

## 2022-11-04 DIAGNOSIS — Z515 Encounter for palliative care: Secondary | ICD-10-CM | POA: Diagnosis not present

## 2022-11-04 DIAGNOSIS — Z7189 Other specified counseling: Secondary | ICD-10-CM | POA: Diagnosis not present

## 2022-11-04 DIAGNOSIS — A4102 Sepsis due to Methicillin resistant Staphylococcus aureus: Secondary | ICD-10-CM | POA: Diagnosis not present

## 2022-11-04 LAB — BASIC METABOLIC PANEL
Anion gap: 8 (ref 5–15)
BUN: 16 mg/dL (ref 8–23)
CO2: 24 mmol/L (ref 22–32)
Calcium: 7.7 mg/dL — ABNORMAL LOW (ref 8.9–10.3)
Chloride: 98 mmol/L (ref 98–111)
Creatinine, Ser: 0.77 mg/dL (ref 0.61–1.24)
GFR, Estimated: >60 mL/min (ref >60)
Glucose, Bld: 108 mg/dL — ABNORMAL HIGH (ref 70–99)
Potassium: 3.9 mmol/L (ref 3.5–5.1)
Sodium: 130 mmol/L — ABNORMAL LOW (ref 135–145)

## 2022-11-04 LAB — MAGNESIUM: Magnesium: 1.6 mg/dL — ABNORMAL LOW (ref 1.7–2.4)

## 2022-11-04 NOTE — Progress Notes (Addendum)
Pt ambulated in hall twice today, approx 300' this a.m. and 470' this evening. Minimal assist, room air, tolerated well.  Declined sitting up in recliner today.  RLQ dressing s/p jp drain removal continuing to ooze bloody drainage.  Gauze dressing changed this morning, saturated. Changed again this afternoon, moderate drainage, seems to be slowing down a bit. L groin continuing to drain, output documented in flowsheets.  Wound vac to B groins, no output today.

## 2022-11-04 NOTE — Progress Notes (Signed)
PROGRESS NOTE    Edward Campos  ZOX:[id number] DOB: 1932/04/04 DOA: 10/22/2022 PCP: Patient, No Pcp Per   Brief Narrative:  89 y.o. male with past medical history of HLD, PVD s/p bypass grafting, AAA, SCLC hospitalization in July and 24 for infected right to left femoral to femoral bypass graft requiring temporary wound VAC placement who presented as a direct admission to the vascular service for concern a infected femoral bypass with fevers at home. CT angiogram of the abdomen pelvis noted right bifemoral bypass graft with fluid/inflammatory changes associated with the femorofemoral bypass most prominent on the left compatible with a graft infection, and a 5.8 infrarenal abdominal aortic aneurysm grossly unchanged in size.  He was started on broad-spectrum antibiotics.  Subsequently, [facility] took over as primary team.  He underwent vascular surgery intervention with wound VAC placement on 10/25/2022.  Blood cultures on 10/23/2022 grew MRSA.  ID consulted and currently following: Currently on IV daptomycin.  Patient underwent direct repair of right common femoral artery pseudoaneurysm; I&D of left groin seroma, left sartorius flap to left groin and right sartorius flap to right groin by vascular surgery on 10/31/2022.  Assessment & Plan:   Sepsis: Present on admission Infected left femoral graft MRSA bacteremia -Currently on daptomycin as per ID.  Patient will need at least 6 weeks of antibiotics as per ID.  Repeat blood cultures have been negative so far. -2D echo showed no vegetation.  ID is recommending TEE.  Cardiology planning for TEE next week. -Patient underwent vascular surgery intervention with wound VAC placement on 10/25/2022.   -Currently afebrile and hemodynamically stable -underwent direct repair of right common femoral artery pseudoaneurysm; I&D of left groin seroma, left sartorius flap to left groin and right sartorius flap to right groin by vascular surgery on 10/31/2022.  Wound and VAC  care as per vascular surgery.  Acute on chronic hyponatremia -Baseline sodium of 128-131.  Presented with sodium of 122. -Sodium 130 this morning.  Off IV fluids.  Continue salt tablets.  Continue fluid restriction.  Repeat a.m. labs.  Small cell cancer with bony metastases Goals of care -Noted started on chemotherapy because of issues with wound not healing.  Outpatient follow-up with oncology -Palliative care following.  Currently listed as full code.  Overall prognosis is guarded to poor.  Leukocytosis -Resolved  Anemia of chronic disease -Hemoglobin currently stable.  Monitor intermittently  Hypokalemia -Resolved  Hypomagnesemia -Replace.  Repeat a.m. labs.  Elevated LFTs -Resolved.  Crestor has been discontinued  Hyperlipidemia -Crestor on hold.  Possibly resume on discharge  PAD -Continue aspirin.  Statin on hold.  Continue Plavix  AAA --Patient with 5.8 x 5.7 cm infrarenal abdominal aortic aneurysm unchanged in size. -Vascular surgery following.  Physical deconditioning -PT following and recommending SNF.     DVT prophylaxis: Heparin subcutaneous Code Status: Full Family Communication: Daughter on phone Disposition Plan: Status is: Inpatient Remains inpatient appropriate because: Of severity of illness  Consultants: Vascular surgery/palliative care/ID  Procedures: As above  Antimicrobials:  Anti-infectives (From admission, onward)    Start     Dose/Rate Route Frequency Ordered Stop   10/26/22 0945  DAPTOmycin (CUBICIN) 600 mg in sodium chloride 0.9 % IVPB        8 mg/kg  75.9 kg 124 mL/hr over 30 Minutes Intravenous Daily 10/26/22 0855     10/26/22 0945  cefTRIAXone (ROCEPHIN) 2 g in sodium chloride 0.9 % 100 mL IVPB  Status:  Discontinued        2 g  200 mL/hr over 30 Minutes Intravenous Every 24 hours 10/26/22 0855 10/26/22 0905   10/25/22 1006  ceFAZolin 1 g / gentamicin 80 mg in NS 500 mL surgical irrigation  Status:  Discontinued          As  needed 10/25/22 1006 10/25/22 1318   10/23/22 1145  vancomycin (VANCOREADY) IVPB 1250 mg/250 mL  Status:  Discontinued        1,250 mg 166.7 mL/hr over 90 Minutes Intravenous Every 24 hours 10/23/22 1051 10/26/22 0855   10/23/22 1100  ceFEPIme (MAXIPIME) 2 g in sodium chloride 0.9 % 100 mL IVPB  Status:  Discontinued        2 g 200 mL/hr over 30 Minutes Intravenous Every 12 hours 10/23/22 1041 10/26/22 0855        Subjective: Patient seen and examined at bedside.  No chest pain or worsening shortness of breath, abdominal pain or vomiting noted.   Objective: Vitals:   11/03/22 2046 11/03/22 2307 11/04/22 0359 11/04/22 0718  BP: 106/70 128/62 131/73 (!) 144/73  Pulse: 98 81 80 81  Resp: 20 18 20 20   Temp: 98.3 F (36.8 C) 98.1 F (36.7 C) 98.2 F (36.8 C) 97.8 F (36.6 C)  TempSrc: Oral Oral Oral Oral  SpO2: 100% 93% 98% 98%  Weight:   72.1 kg   Height:        Intake/Output Summary (Last 24 hours) at 11/04/2022 0728 Last data filed at 11/04/2022 0600 Gross per 24 hour  Intake --  Output 1040 ml  Net -1040 ml   Filed Weights   10/31/22 1453 11/03/22 0305 11/04/22 0359  Weight: 76 kg 73.2 kg 72.1 kg    Examination:  General: No acute distress.  Still on room air.  Chronically ill and deconditioned looking. ENT/neck: No palpable neck masses or JVD elevation noted  respiratory: Bilateral decreased breath sounds at bases with scattered crackles  CVS: Rate mostly controlled; S1 and S2 are heard  abdominal: Soft, nontender, distended mildly; no organomegaly, normal bowel sounds heard  extremities: No clubbing; mild lower extremity edema present CNS: Alert and oriented.  Still slow to respond.  No obvious focal deficits noted  lymph: No cervical lymphadenopathy  skin: Bilateral groins have dressing  psych: No signs of agitation.  Affect is flat currently. Musculoskeletal: No obvious joint erythema/deformity  Data Reviewed: I have personally reviewed following labs and  imaging studies  CBC: Recent Labs  Lab 10/29/22 0237 10/29/22 1133 10/30/22 0327 10/30/22 1246 10/31/22 0701 10/31/22 1715 11/01/22 0415 11/02/22 0429 11/03/22 0408  WBC 13.5*   < > 11.4*  --  9.6  --  15.0* 9.1 9.0  NEUTROABS 12.6*  --  8.9*  --  7.2  --  12.9*  --   --   HGB 8.5*   < > 8.1*   < > 7.5* 6.5* 8.8* 8.5* 8.4*  HCT 24.1*   < > 22.6*   < > 22.0* 19.0* 25.5* 24.9* 25.1*  MCV 85.5   < > 84.6  --  85.9  --  85.0 86.5 89.3  PLT 321   < > 312  --  353  --  342 373 367   < > = values in this interval not displayed.   Basic Metabolic Panel: Recent Labs  Lab 10/29/22 0237 10/29/22 1133 10/31/22 0701 10/31/22 1715 11/01/22 0415 11/02/22 0429 11/03/22 0408 11/04/22 0350  NA 125*   < > 128* 129* 127* 126* 128* 130*  K 4.0   < >  3.6 3.8 4.2 4.5 3.8 3.9  CL 94*   < > 96*  --  97* 94* 96* 98  CO2 24   < > 22  --  20* 24 24 24   GLUCOSE 103*   < > 96  --  104* 114* 102* 108*  BUN 12   < > 7*  --  14 20 17 16   CREATININE 0.82   < > 0.69  --  0.82 0.95 0.80 0.77  CALCIUM 7.6*   < > 7.5*  --  7.6* 7.5* 7.6* 7.7*  MG 1.8  --  1.7  --  1.7 1.9  --  1.6*  PHOS 2.4*  --  2.3*  --   --   --   --   --    < > = values in this interval not displayed.   GFR: Estimated Creatinine Clearance: 65.1 mL/min (by C-G formula based on SCr of 0.77 mg/dL). Liver Function Tests: Recent Labs  Lab 10/29/22 0237 10/31/22 0701  AST 32 29  ALT 53* 43  ALKPHOS 65 66  BILITOT 1.1 1.1  PROT 5.1* 5.3*  ALBUMIN 2.1* 2.2*   No results for input(s): "LIPASE", "AMYLASE" in the last 168 hours. No results for input(s): "AMMONIA" in the last 168 hours. Coagulation Profile: No results for input(s): "INR", "PROTIME" in the last 168 hours. Cardiac Enzymes: Recent Labs  Lab 11/02/22 0429  CKTOTAL 113   BNP (last 3 results) No results for input(s): "PROBNP" in the last 8760 hours. HbA1C: No results for input(s): "HGBA1C" in the last 72 hours. CBG: No results for input(s): "GLUCAP" in the  last 168 hours. Lipid Profile: No results for input(s): "CHOL", "HDL", "LDLCALC", "TRIG", "CHOLHDL", "LDLDIRECT" in the last 72 hours. Thyroid Function Tests: No results for input(s): "TSH", "T4TOTAL", "FREET4", "T3FREE", "THYROIDAB" in the last 72 hours.  Anemia Panel: No results for input(s): "VITAMINB12", "FOLATE", "FERRITIN", "TIBC", "IRON", "RETICCTPCT" in the last 72 hours.  Sepsis Labs: No results for input(s): "PROCALCITON", "LATICACIDVEN" in the last 168 hours.  Recent Results (from the past 240 hour(s))  Aerobic/Anaerobic Culture w Gram Stain (surgical/deep wound)     Status: None   Collection Time: 10/25/22 12:35 PM   Specimen: Path fluid; Tissue  Result Value Ref Range Status   Specimen Description FLUID  Final   Special Requests infected bilateral femoral graft  Final   Gram Stain   Final    ABUNDANT WBC PRESENT, PREDOMINANTLY PMN ABUNDANT GRAM POSITIVE COCCI IN PAIRS IN CLUSTERS    Culture   Final    MODERATE STAPHYLOCOCCUS AUREUS NO ANAEROBES ISOLATED Performed at [facility], [address], [place], [place] [address]    Report Status 10/30/2022 FINAL  Final   Organism ID, Bacteria STAPHYLOCOCCUS AUREUS  Final      Susceptibility   Staphylococcus aureus - MIC*    CIPROFLOXACIN >=8 RESISTANT Resistant     ERYTHROMYCIN <=0.25 SENSITIVE Sensitive     GENTAMICIN <=0.5 SENSITIVE Sensitive     OXACILLIN >=4 RESISTANT Resistant     TETRACYCLINE <=1 SENSITIVE Sensitive     VANCOMYCIN <=0.5 SENSITIVE Sensitive     TRIMETH/SULFA <=10 SENSITIVE Sensitive     CLINDAMYCIN <=0.25 SENSITIVE Sensitive     RIFAMPIN <=0.5 SENSITIVE Sensitive     Inducible Clindamycin NEGATIVE Sensitive     LINEZOLID 2 SENSITIVE Sensitive     * MODERATE STAPHYLOCOCCUS AUREUS  Culture, blood (Routine X 2) w Reflex to ID Panel     Status:  Abnormal   Collection Time: 10/27/22  2:46 PM   Specimen: BLOOD RIGHT HAND  Result Value Ref Range Status   Specimen Description BLOOD RIGHT HAND   Final   Special Requests   Final    BOTTLES DRAWN AEROBIC AND ANAEROBIC Blood Culture adequate volume   Culture  Setup Time   Final    GRAM POSITIVE COCCI IN BOTH AEROBIC AND ANAEROBIC BOTTLES CRITICAL VALUE NOTED.  VALUE IS CONSISTENT WITH PREVIOUSLY REPORTED AND CALLED VALUE.    Culture (A)  Final    STAPHYLOCOCCUS AUREUS SUSCEPTIBILITIES PERFORMED ON PREVIOUS CULTURE WITHIN THE LAST 5 DAYS. Performed at [facility], [address], [place], [place] [address]    Report Status 10/31/2022 FINAL  Final  Culture, blood (Routine X 2) w Reflex to ID Panel     Status: None   Collection Time: 10/27/22  2:47 PM   Specimen: BLOOD RIGHT ARM  Result Value Ref Range Status   Specimen Description BLOOD RIGHT ARM  Final   Special Requests   Final    BOTTLES DRAWN AEROBIC AND ANAEROBIC Blood Culture adequate volume   Culture   Final    NO GROWTH 5 DAYS Performed at [facility], [address] [place], [place] [address]    Report Status 11/01/2022 FINAL  Final  Culture, blood (Routine X 2) w Reflex to ID Panel     Status: None   Collection Time: 10/29/22 11:33 AM   Specimen: BLOOD LEFT ARM  Result Value Ref Range Status   Specimen Description BLOOD LEFT ARM  Final   Special Requests   Final    BOTTLES DRAWN AEROBIC AND ANAEROBIC Blood Culture adequate volume   Culture   Final    NO GROWTH 5 DAYS Performed at [facility], [address], [place], [place] [address]    Report Status 11/03/2022 FINAL  Final  Culture, blood (Routine X 2) w Reflex to ID Panel     Status: None   Collection Time: 10/29/22 11:35 AM   Specimen: BLOOD  Result Value Ref Range Status   Specimen Description BLOOD RIGHT ANTECUBITAL  Final   Special Requests   Final    BOTTLES DRAWN AEROBIC AND ANAEROBIC Blood Culture adequate volume   Culture   Final    NO GROWTH 5 DAYS Performed at [facility], [address], [place], [place] [address]    Report Status 11/03/2022 FINAL  Final   Minimum Inhibitory Conc. (1 Drug)     Status: None   Collection Time: 10/29/22 11:35 AM  Result Value Ref Range Status   Min Inhibitory Conc (1 Drug) Final report  Corrected    Comment: (NOTE) Performed At: [facility] [address] [place], [place] [id number] Jolene Schimke MD [id number] CORRECTED ON 08/22 AT 1536: PREVIOUSLY REPORTED AS Preliminary report    Source CRE SAUR BLOOD  Final    Comment: Performed at [facility], [address], [place], [place] [address]         Radiology Studies: No results found.      Scheduled Meds:  sodium chloride   Intravenous Once   aspirin EC  81 mg Oral Daily   clopidogrel  75 mg Oral Daily   docusate sodium  100 mg Oral Daily   fluticasone  2 spray Each Nare Daily   guaiFENesin  1,200 mg Oral BID   heparin  5,000 Units Subcutaneous Q8H   multivitamin with minerals  1 tablet Oral Daily   pantoprazole  40 mg Oral Daily   sodium chloride flush  3 mL Intravenous Q12H   sodium chloride  1 g Oral TID WC   Continuous Infusions:  sodium chloride     sodium chloride     DAPTOmycin (CUBICIN) 600 mg in sodium chloride 0.9 % IVPB 600 mg (11/03/22 1503)          Glade Lloyd, MD [facility] 11/04/2022, 7:28 AM

## 2022-11-04 NOTE — Plan of Care (Signed)
Poc progressing.  

## 2022-11-04 NOTE — Progress Notes (Addendum)
  Progress Note    11/04/2022 9:14 AM 4 Days Post-Op  Subjective:  feels like he has a scab on the inside of his right nostril    Vitals:   11/04/22 0359 11/04/22 0718  BP: 131/73 (!) 144/73  Pulse: 80 81  Resp: 20 20  Temp: 98.2 F (36.8 C) 97.8 F (36.6 C)  SpO2: 98% 98%    Physical Exam: General:  no acute distress, laying in bed Cardiac:  regular Lungs:  nonlabored Incisions:  bilateral groin incisions with prevena vacs with good seal Extremities:  BLE warm and well perfused with brisk DP doppler signals. L groin JP drain with 440cc output in 24 hours   CBC    Component Value Date/Time   WBC 9.0 11/03/2022 0408   RBC 2.81 (L) 11/03/2022 0408   HGB 8.4 (L) 11/03/2022 0408   HGB 13.9 10/27/2020 1038   HCT 25.1 (L) 11/03/2022 0408   HCT 41.0 10/27/2020 1038   PLT 367 11/03/2022 0408   PLT 208 10/27/2020 1038   MCV 89.3 11/03/2022 0408   MCV 90 10/27/2020 1038   MCH 29.9 11/03/2022 0408   MCHC 33.5 11/03/2022 0408   RDW 17.3 (H) 11/03/2022 0408   RDW 12.7 10/27/2020 1038   LYMPHSABS 0.6 (L) 11/01/2022 0415   MONOABS 0.9 11/01/2022 0415   EOSABS 0.0 11/01/2022 0415   BASOSABS 0.0 11/01/2022 0415    BMET    Component Value Date/Time   NA 130 (L) 11/04/2022 0350   NA 138 10/27/2020 1038   K 3.9 11/04/2022 0350   CL 98 11/04/2022 0350   CO2 24 11/04/2022 0350   GLUCOSE 108 (H) 11/04/2022 0350   BUN 16 11/04/2022 0350   BUN 17 10/27/2020 1038   CREATININE 0.77 11/04/2022 0350   CALCIUM 7.7 (L) 11/04/2022 0350   GFRNONAA >60 11/04/2022 0350   GFRAA >60 07/07/2019 0259    INR    Component Value Date/Time   INR 1.2 08/16/2022 0637     Intake/Output Summary (Last 24 hours) at 11/04/2022 0914 Last data filed at 11/04/2022 0600 Gross per 24 hour  Intake --  Output 790 ml  Net -790 ml      Assessment/Plan:  89 y.o. male is 4 days post op, s/p:   1) direct repair of right common femoral artery pseudoaneurysm 2) incision and drainage of left  groin seroma 3) left sartorius flap to left groin 4) right sartorius flap to right groin  10/25/2022: Femoral-femoral bypass graft excision Left common femoral vein primary repair Redo femorofemoral bypass using cadaveric greater saphenous vein Right common femoral to left profunda with interposition bypass to the superficial femoral artery.  -Doing well this morning and denies any pain -Bilateral groin incisions with prevena vacs with good seal. Right prevena vac with 0 output -Left groin JP drain with 440cc output in 24h, increased? Will keep in until drain output is <100cc -BLE warm and well perfused with brisk DP doppler signals   Loel Dubonnet, PA-C [facility] [phone] 11/04/2022 9:14 AM  I agree with the above.  Have seen and evaluated the patient.  He continues to have persistent drainage from the left groin JP.  Prevena wound vacs remain with good seal.  Durene Cal

## 2022-11-04 NOTE — TOC Progression Note (Signed)
Transition of Care [facility]) - Progression Note    Patient Details  Name: Edward Campos MRN: [id number] Date of Birth: 03-Nov-1932  Transition of Care [facility]) CM/SW Contact  Patrice Paradise, LCSW Phone Number: 11/04/2022, 4:19 PM  Clinical Narrative:    CSW spoke with pt and daughter about bed offers. CSW provided a highlighted list of all offers that have come back. They requested a follow up call to [facility].  TOC team will continue to assist with discharge planning needs.   Expected Discharge Plan: Skilled Nursing Facility Barriers to Discharge: Continued Medical Work up  Expected Discharge Plan and Services   Discharge Planning Services: CM Consult Post Acute Care Choice: Home Health, Resumption of Svcs/PTA Provider, Skilled Nursing Facility Living arrangements for the past 2 months: Single Family Home                           HH Arranged: PT, OT, Nurse's Aide, RN, Social Work [employer] Agency: [facility] ([facility]) Date HH Agency Contacted: 10/26/22 Time HH Agency Contacted: 1458 Representative spoke with at [facility] Agency: Morrie Sheldon   Social Determinants of Health (SDOH) Interventions SDOH Screenings   Food Insecurity: No Food Insecurity (10/25/2022)  Housing: Low Risk  (10/25/2022)  Transportation Needs: No Transportation Needs (10/25/2022)  Utilities: Not At Risk (10/25/2022)  Tobacco Use: Medium Risk (10/31/2022)    Readmission Risk Interventions     No data to display

## 2022-11-04 NOTE — Progress Notes (Signed)
   Palliative Medicine Inpatient Follow Up Note  HPI: 89 y.o. male   admitted on 10/22/2022 with history of a right to left femorofemoral bypass with common and external iliac artery stenting on the right with a known abdominal aortic aneurysm.  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 Palliative care support given patient acute on chronic disease burden.   Today's Discussion 11/04/2022  *Please note that this is a verbal dictation therefore any spelling or grammatical errors are due to the "[employer]agon [employer] One" system interpretation.  Chart reviewed inclusive of vital signs, progress notes, laboratory results, and diagnostic images.  I met with Knolan this morning. He was sitting at the edge of the bed eating his breakfast. He shares that he has been feeling "great" since the washout procedure. He expresses hopefulness for further improvements. He states that he is awaiting another "test" on Tuesday and is hopeful that this is not revealing as he would like to get to rehab sooner than later. Otherwise Aquilla endorses no additional questions or concerns.  Palliative Support Provided.   Objective Assessment: Vital Signs Vitals:   11/04/22 0359 11/04/22 0718  BP: 131/73 (!) 144/73  Pulse: 80 81  Resp: 20 20  Temp: 98.2 F (36.8 C) 97.8 F (36.6 C)  SpO2: 98% 98%    Intake/Output Summary (Last 24 hours) at 11/04/2022 4098 Last data filed at 11/04/2022 0600 Gross per 24 hour  Intake --  Output 790 ml  Net -790 ml   Last Weight  Most recent update: 11/04/2022  4:05 AM    Weight  72.1 kg (158 lb 15.2 oz)            Gen:  Elderly Caucasian M in NAD HEENT: moist mucous membranes CV: Regular rate and rhythm  PULM: On RA, breathing is even and nonlabored ABD: soft/nontender  EXT: No edema  Neuro: Alert and oriented x3   SUMMARY OF RECOMMENDATIONS   Full Code/ Full Scope of Care -> re-confirmed  Jacquise is hopeful to complete antibiotics and go to  skilled nursing  Goals are for ongoing improvement/recovery   Ongoing PMT support  Billing based on MDM: Moderate  ______________________________________________________________________________________ Lamarr Lulas [place] Palliative Medicine Team Team Cell Phone: [phone] Please utilize secure chat with additional questions, if there is no response within 30 minutes please call the above phone number  Palliative Medicine Team providers are available by phone from 7am to 7pm daily and can be reached through the team cell phone.  Should this patient require assistance outside of these hours, please call the patient's attending physician.

## 2022-11-05 DIAGNOSIS — Z515 Encounter for palliative care: Secondary | ICD-10-CM | POA: Diagnosis not present

## 2022-11-05 DIAGNOSIS — Z7189 Other specified counseling: Secondary | ICD-10-CM | POA: Diagnosis not present

## 2022-11-05 DIAGNOSIS — A4102 Sepsis due to Methicillin resistant Staphylococcus aureus: Secondary | ICD-10-CM | POA: Diagnosis not present

## 2022-11-05 LAB — BASIC METABOLIC PANEL
Anion gap: 9 (ref 5–15)
BUN: 15 mg/dL (ref 8–23)
CO2: 23 mmol/L (ref 22–32)
Calcium: 7.9 mg/dL — ABNORMAL LOW (ref 8.9–10.3)
Chloride: 97 mmol/L — ABNORMAL LOW (ref 98–111)
Creatinine, Ser: 0.85 mg/dL (ref 0.61–1.24)
GFR, Estimated: >60 mL/min (ref >60)
Glucose, Bld: 111 mg/dL — ABNORMAL HIGH (ref 70–99)
Potassium: 4.3 mmol/L (ref 3.5–5.1)
Sodium: 129 mmol/L — ABNORMAL LOW (ref 135–145)

## 2022-11-05 LAB — MAGNESIUM: Magnesium: 1.7 mg/dL (ref 1.7–2.4)

## 2022-11-05 NOTE — Progress Notes (Addendum)
[facility] [place]  Subjective  - doing OK    Objective 139/78 80 97.7 F (36.5 C) (Oral) 19 97%  Intake/Output Summary (Last 24 hours) at 11/05/2022 0654 Last data filed at 11/05/2022 0600 Gross per 24 hour  Intake --  Output 1770 ml  Net -1770 ml    Palpable DP pulses, motor intact Right groin RN reported dressing changes x 4 due to bloody drainage, groin soft, vac with good seal Left JP maintained with 190 over night and 320 ml total Heart Afib Lungs non labored breathing  Assessment/Planning: 10/31/22   89 y.o. male  s/p:   1) direct repair of right common femoral artery pseudoaneurysm 2) incision and drainage of left groin seroma 3) left sartorius flap to left groin 4) right sartorius flap to right groin   10/25/2022: Femoral-femoral bypass graft excision Left common femoral vein primary repair Redo femorofemoral bypass using cadaveric greater saphenous vein Right common femoral to left profunda with interposition bypass to the superficial femoral artery.  Right groin dressing changes as needed  Maintain vacs and left JP Followed by ID on IV Daptomycin for positive blood cultures with MRSA HGB 8.4  asymptomatic Afib plan for Echo TEE tomorrow     Mosetta Pigeon 11/05/2022 6:54 AM --  VASCULAR STAFF ADDENDUM: I have independently interviewed and examined the patient. I agree with the above.  Continue left drain.  Continue right Prevena OOB as tolerated Pt remains high risk for graft blowout/ infection as the filed was grossly infected when the initial graft was excised two weeks ago.  Please place TED hose bilaterally to help with seroma management    Fara Olden, MD [facility] Phone Number: [phone] 11/05/2022 2:02 PM    Laboratory Lab Results: Recent Labs    11/03/22 0408  WBC 9.0  HGB 8.4*  HCT 25.1*  PLT 367   BMET Recent Labs    11/03/22 0408  11/04/22 0350  NA 128* 130*  K 3.8 3.9  CL 96* 98  CO2 24 24  GLUCOSE 102* 108*  BUN 17 16  CREATININE 0.80 0.77  CALCIUM 7.6* 7.7*    COAG Lab Results  Component Value Date   INR 1.2 08/16/2022   INR 1.1 06/04/2019   No results found for: "PTT"

## 2022-11-05 NOTE — Progress Notes (Signed)
Physical Therapy Treatment Patient Details Name: Edward Campos MRN: [id number] DOB: Aug 20, 1932 Today's Date: 11/05/2022   History of Present Illness Pt is a 89 y.o. male admitted to [facility] on 10/22/2022 with Infected right to left femoral-femoral Dacron bypass graft. On 10/25/22, pt underwent femoral bypass graft excision with Left common femoral vein primary repair, redo of femorofemoral bypass, Right common femoral to left profunda with interposition bypass to the superficial femoral artery, myriad Morcel placement, and Bilateral Prevena wound VAC placement.  Developed L groin seroma and on 8/21 pt underwent direct repair of R common femoral a. Pseudoaneurysm, I&D L groin seroma, L sartorius flap to L groin and R sartorius flap to R groin.  Recent hospitalizations in June for infected right to left femoral to femoral bypass graft and in July 2024 with a postobstructive pneumonia. PMH significant for HLD, PVD s/p bypass grafting, AAA, SCLC w/bony mets(diagnosed June 2024).    PT Comments  Patient progressing with mobility able to walk further distance with one standing rest due to leg fatigue.  Patient with ongoing skilled needs for mobility and nursing care.  He remains appropriate for SNF level rehab prior to d/c home.  PT will continue to follow in the acute setting.    If plan is discharge home, recommend the following: A little help with walking and/or transfers;A little help with bathing/dressing/bathroom;Assistance with cooking/housework;Assist for transportation;Help with stairs or ramp for entrance   Can travel by private vehicle     Yes  Equipment Recommendations  Rolling walker (2 wheels)    Recommendations for Other Services       Precautions / Restrictions Precautions Precautions: Fall Precaution Comments: B groin wound vacs; L JP drain Restrictions Weight Bearing Restrictions: No     Mobility  Bed Mobility Overal bed mobility: Needs Assistance       Supine to sit:  Supervision     General bed mobility comments: assist for lines    Transfers Overall transfer level: Needs assistance Equipment used: Rolling walker (2 wheels) Transfers: Sit to/from Stand Sit to Stand: Min assist           General transfer comment: for balance and lines    Ambulation/Gait Ambulation/Gait assistance: Contact guard assist Gait Distance (Feet): 200 Feet (x 2) Assistive device: Rolling walker (2 wheels) Gait Pattern/deviations: Step-through pattern, Decreased stride length, Shuffle, Wide base of support       General Gait Details: assist for balance, (for walker due to wound vac); standing rest in hallway lifting legs and reporting leg fatigue   Stairs             Wheelchair Mobility     Tilt Bed    Modified Rankin (Stroke Patients Only)       Balance Overall balance assessment: Needs assistance   Sitting balance-Leahy Scale: Good     Standing balance support: Bilateral upper extremity supported, Reliant on assistive device for balance Standing balance-Leahy Scale: Poor                              Cognition Arousal: Alert Behavior During Therapy: WFL for tasks assessed/performed Overall Cognitive Status: Within Functional Limits for tasks assessed                                          Exercises      General  Comments General comments (skin integrity, edema, etc.): VSS on RA, noted moderate amount of bloody drainage in drain and wound vac      Pertinent Vitals/Pain Pain Assessment Faces Pain Scale: Hurts a little bit Pain Location: B groin wounds Pain Descriptors / Indicators: Discomfort Pain Intervention(s): Monitored during session    Home Living                          Prior Function            PT Goals (current goals can now be found in the care plan section) Progress towards PT goals: Progressing toward goals    Frequency    Min 1X/week      PT Plan       Co-evaluation              AM-PAC PT "6 Clicks" Mobility   Outcome Measure  Help needed turning from your back to your side while in a flat bed without using bedrails?: A Little Help needed moving from lying on your back to sitting on the side of a flat bed without using bedrails?: A Little Help needed moving to and from a bed to a chair (including a wheelchair)?: A Little Help needed standing up from a chair using your arms (e.g., wheelchair or bedside chair)?: A Little Help needed to walk in hospital room?: A Little Help needed climbing 3-5 steps with a railing? : Total 6 Click Score: 16    End of Session Equipment Utilized During Treatment: Gait belt Activity Tolerance: Patient tolerated treatment well Patient left: in bed;with call bell/phone within reach   PT Visit Diagnosis: Difficulty in walking, not elsewhere classified (R26.2);Muscle weakness (generalized) (M62.81)     Time: 0102-7253 PT Time Calculation (min) (ACUTE ONLY): 17 min  Charges:    $Gait Training: 8-22 mins PT General Charges $$ ACUTE PT VISIT: 1 Visit                     Sheran Lawless, PT Acute Rehabilitation Services Office:[phone] 11/05/2022    [name] 11/05/2022, 5:56 PM

## 2022-11-05 NOTE — Progress Notes (Signed)
PROGRESS NOTE    Edward Campos  ONG:[id number] DOB: 1932-05-21 DOA: 10/22/2022 PCP: Patient, No Pcp Per   Brief Narrative:  89 y.o. male with past medical history of HLD, PVD s/p bypass grafting, AAA, SCLC hospitalization in July and 24 for infected right to left femoral to femoral bypass graft requiring temporary wound VAC placement who presented as a direct admission to the vascular service for concern a infected femoral bypass with fevers at home. CT angiogram of the abdomen pelvis noted right bifemoral bypass graft with fluid/inflammatory changes associated with the femorofemoral bypass most prominent on the left compatible with a graft infection, and a 5.8 infrarenal abdominal aortic aneurysm grossly unchanged in size.  He was started on broad-spectrum antibiotics.  Subsequently, [facility] took over as primary team.  He underwent vascular surgery intervention with wound VAC placement on 10/25/2022.  Blood cultures on 10/23/2022 grew MRSA.  ID consulted and currently following: Currently on IV daptomycin.  Patient underwent direct repair of right common femoral artery pseudoaneurysm; I&D of left groin seroma, left sartorius flap to left groin and right sartorius flap to right groin by vascular surgery on 10/31/2022.  Assessment & Plan:   Sepsis: Present on admission Infected left femoral graft MRSA bacteremia -Currently on daptomycin as per ID.  Patient will need at least 6 weeks of antibiotics as per ID.  Repeat blood cultures have been negative so far. -2D echo showed no vegetation.  ID is recommending TEE.  Cardiology planning for TEE possibly tomorrow. -Patient underwent vascular surgery intervention with wound VAC placement on 10/25/2022.   -Currently afebrile and hemodynamically stable -underwent direct repair of right common femoral artery pseudoaneurysm; I&D of left groin seroma, left sartorius flap to left groin and right sartorius flap to right groin by vascular surgery on 10/31/2022.  Wound and  VAC care as per vascular surgery.  Acute on chronic hyponatremia -Baseline sodium of 128-131.  Presented with sodium of 122. -Sodium 130 on 11/04/2022.  Off IV fluids.  Continue salt tablets.  Continue fluid restriction.  Labs pending for today.  Repeat a.m. labs.  Small cell cancer with bony metastases Goals of care -Noted started on chemotherapy because of issues with wound not healing.  Outpatient follow-up with oncology -Palliative care following.  Currently listed as full code.  Overall prognosis is guarded to poor.  Leukocytosis -Resolved  Anemia of chronic disease -Hemoglobin currently stable.  Monitor intermittently  Hypokalemia -Resolved  Hypomagnesemia -Labs pending for today.  Elevated LFTs -Resolved.  Crestor has been discontinued  Hyperlipidemia -Crestor on hold.  Possibly resume on discharge  PAD -Continue aspirin.  Statin on hold.  Continue Plavix  AAA --Patient with 5.8 x 5.7 cm infrarenal abdominal aortic aneurysm unchanged in size. -Vascular surgery following.  Physical deconditioning -PT following and recommending SNF.     DVT prophylaxis: Heparin subcutaneous Code Status: Full Family Communication: Daughter on phone Disposition Plan: Status is: Inpatient Remains inpatient appropriate because: Of severity of illness  Consultants: Vascular surgery/palliative care/ID  Procedures: As above  Antimicrobials:  Anti-infectives (From admission, onward)    Start     Dose/Rate Route Frequency Ordered Stop   10/26/22 0945  DAPTOmycin (CUBICIN) 600 mg in sodium chloride 0.9 % IVPB        8 mg/kg  75.9 kg 124 mL/hr over 30 Minutes Intravenous Daily 10/26/22 0855     10/26/22 0945  cefTRIAXone (ROCEPHIN) 2 g in sodium chloride 0.9 % 100 mL IVPB  Status:  Discontinued  2 g 200 mL/hr over 30 Minutes Intravenous Every 24 hours 10/26/22 0855 10/26/22 0905   10/25/22 1006  ceFAZolin 1 g / gentamicin 80 mg in NS 500 mL surgical irrigation  Status:   Discontinued          As needed 10/25/22 1006 10/25/22 1318   10/23/22 1145  vancomycin (VANCOREADY) IVPB 1250 mg/250 mL  Status:  Discontinued        1,250 mg 166.7 mL/hr over 90 Minutes Intravenous Every 24 hours 10/23/22 1051 10/26/22 0855   10/23/22 1100  ceFEPIme (MAXIPIME) 2 g in sodium chloride 0.9 % 100 mL IVPB  Status:  Discontinued        2 g 200 mL/hr over 30 Minutes Intravenous Every 12 hours 10/23/22 1041 10/26/22 0855        Subjective: Patient seen and examined at bedside.  No fever, vomiting or worsening shortness of breath reported.  Objective: Vitals:   11/04/22 1606 11/04/22 1952 11/05/22 0026 11/05/22 0451  BP: 120/71 130/82  139/78  Pulse: 85 94  80  Resp: (!) 30 19    Temp: 97.9 F (36.6 C) 98 F (36.7 C) 97.7 F (36.5 C) 97.7 F (36.5 C)  TempSrc: Oral Oral Oral Oral  SpO2: 100% 99%  97%  Weight:    75.3 kg  Height:        Intake/Output Summary (Last 24 hours) at 11/05/2022 0733 Last data filed at 11/05/2022 0600 Gross per 24 hour  Intake --  Output 1770 ml  Net -1770 ml   Filed Weights   11/03/22 0305 11/04/22 0359 11/05/22 0451  Weight: 73.2 kg 72.1 kg 75.3 kg    Examination:  General: On room air.  No distress currently.  Chronically ill and deconditioned looking. ENT/neck: No obvious JVD elevation or palpable thyromegaly noted  respiratory: Decreased breath sounds at bases bilaterally with some crackles  CVS: S1-S2 heard; rate currently controlled abdominal: Soft, nontender, slightly distended; no organomegaly, bowel sounds are heard  extremities: Trace lower extremity edema present; no cyanosis  CNS: Awake and alert.  Still slow to respond.  No focal deficits noted.  Lymph: No lymphadenopathy palpable skin: Bilateral groins have dressing  psych: Flat affect.  Currently not agitated. Musculoskeletal: No obvious joint swelling/tenderness  Data Reviewed: I have personally reviewed following labs and imaging studies  CBC: Recent Labs   Lab 10/30/22 0327 10/30/22 1246 10/31/22 0701 10/31/22 1715 11/01/22 0415 11/02/22 0429 11/03/22 0408  WBC 11.4*  --  9.6  --  15.0* 9.1 9.0  NEUTROABS 8.9*  --  7.2  --  12.9*  --   --   HGB 8.1*   < > 7.5* 6.5* 8.8* 8.5* 8.4*  HCT 22.6*   < > 22.0* 19.0* 25.5* 24.9* 25.1*  MCV 84.6  --  85.9  --  85.0 86.5 89.3  PLT 312  --  353  --  342 373 367   < > = values in this interval not displayed.   Basic Metabolic Panel: Recent Labs  Lab 10/31/22 0701 10/31/22 1715 11/01/22 0415 11/02/22 0429 11/03/22 0408 11/04/22 0350  NA 128* 129* 127* 126* 128* 130*  K 3.6 3.8 4.2 4.5 3.8 3.9  CL 96*  --  97* 94* 96* 98  CO2 22  --  20* 24 24 24   GLUCOSE 96  --  104* 114* 102* 108*  BUN 7*  --  14 20 17 16   CREATININE 0.69  --  0.82 0.95 0.80 0.77  CALCIUM 7.5*  --  7.6* 7.5* 7.6* 7.7*  MG 1.7  --  1.7 1.9  --  1.6*  PHOS 2.3*  --   --   --   --   --    GFR: Estimated Creatinine Clearance: 65.1 mL/min (by C-G formula based on SCr of 0.77 mg/dL). Liver Function Tests: Recent Labs  Lab 10/31/22 0701  AST 29  ALT 43  ALKPHOS 66  BILITOT 1.1  PROT 5.3*  ALBUMIN 2.2*   No results for input(s): "LIPASE", "AMYLASE" in the last 168 hours. No results for input(s): "AMMONIA" in the last 168 hours. Coagulation Profile: No results for input(s): "INR", "PROTIME" in the last 168 hours. Cardiac Enzymes: Recent Labs  Lab 11/02/22 0429  CKTOTAL 113   BNP (last 3 results) No results for input(s): "PROBNP" in the last 8760 hours. HbA1C: No results for input(s): "HGBA1C" in the last 72 hours. CBG: No results for input(s): "GLUCAP" in the last 168 hours. Lipid Profile: No results for input(s): "CHOL", "HDL", "LDLCALC", "TRIG", "CHOLHDL", "LDLDIRECT" in the last 72 hours. Thyroid Function Tests: No results for input(s): "TSH", "T4TOTAL", "FREET4", "T3FREE", "THYROIDAB" in the last 72 hours.  Anemia Panel: No results for input(s): "VITAMINB12", "FOLATE", "FERRITIN", "TIBC", "IRON",  "RETICCTPCT" in the last 72 hours.  Sepsis Labs: No results for input(s): "PROCALCITON", "LATICACIDVEN" in the last 168 hours.  Recent Results (from the past 240 hour(s))  Culture, blood (Routine X 2) w Reflex to ID Panel     Status: Abnormal   Collection Time: 10/27/22  2:46 PM   Specimen: BLOOD RIGHT HAND  Result Value Ref Range Status   Specimen Description BLOOD RIGHT HAND  Final   Special Requests   Final    BOTTLES DRAWN AEROBIC AND ANAEROBIC Blood Culture adequate volume   Culture  Setup Time   Final    GRAM POSITIVE COCCI IN BOTH AEROBIC AND ANAEROBIC BOTTLES CRITICAL VALUE NOTED.  VALUE IS CONSISTENT WITH PREVIOUSLY REPORTED AND CALLED VALUE.    Culture (A)  Final    STAPHYLOCOCCUS AUREUS SUSCEPTIBILITIES PERFORMED ON PREVIOUS CULTURE WITHIN THE LAST 5 DAYS. Performed at [facility], [address], [place], [place] [address]    Report Status 10/31/2022 FINAL  Final  Culture, blood (Routine X 2) w Reflex to ID Panel     Status: None   Collection Time: 10/27/22  2:47 PM   Specimen: BLOOD RIGHT ARM  Result Value Ref Range Status   Specimen Description BLOOD RIGHT ARM  Final   Special Requests   Final    BOTTLES DRAWN AEROBIC AND ANAEROBIC Blood Culture adequate volume   Culture   Final    NO GROWTH 5 DAYS Performed at [facility], [address] [place], [place] [address]    Report Status 11/01/2022 FINAL  Final  Culture, blood (Routine X 2) w Reflex to ID Panel     Status: None   Collection Time: 10/29/22 11:33 AM   Specimen: BLOOD LEFT ARM  Result Value Ref Range Status   Specimen Description BLOOD LEFT ARM  Final   Special Requests   Final    BOTTLES DRAWN AEROBIC AND ANAEROBIC Blood Culture adequate volume   Culture   Final    NO GROWTH 5 DAYS Performed at [facility], [address], [place], [place] [address]    Report Status 11/03/2022 FINAL  Final  Culture, blood (Routine X 2) w Reflex to ID Panel  Status: None   Collection  Time: 10/29/22 11:35 AM   Specimen: BLOOD  Result Value Ref Range Status   Specimen Description BLOOD RIGHT ANTECUBITAL  Final   Special Requests   Final    BOTTLES DRAWN AEROBIC AND ANAEROBIC Blood Culture adequate volume   Culture   Final    NO GROWTH 5 DAYS Performed at [facility], [address] [place], [place] [address]    Report Status 11/03/2022 FINAL  Final  Minimum Inhibitory Conc. (1 Drug)     Status: None   Collection Time: 10/29/22 11:35 AM  Result Value Ref Range Status   Min Inhibitory Conc (1 Drug) Final report  Corrected    Comment: (NOTE) Performed At: [facility] [address] [place], [place] [id number] Jolene Schimke MD WG:[id number] CORRECTED ON 08/22 AT 1536: PREVIOUSLY REPORTED AS Preliminary report    Source CRE SAUR BLOOD  Final    Comment: Performed at [facility], [address] [place], [place] [address]         Radiology Studies: No results found.      Scheduled Meds:  sodium chloride   Intravenous Once   aspirin EC  81 mg Oral Daily   clopidogrel  75 mg Oral Daily   docusate sodium  100 mg Oral Daily   fluticasone  2 spray Each Nare Daily   guaiFENesin  1,200 mg Oral BID   heparin  5,000 Units Subcutaneous Q8H   multivitamin with minerals  1 tablet Oral Daily   pantoprazole  40 mg Oral Daily   sodium chloride flush  3 mL Intravenous Q12H   sodium chloride  1 g Oral TID WC   Continuous Infusions:  sodium chloride     sodium chloride     DAPTOmycin (CUBICIN) 600 mg in sodium chloride 0.9 % IVPB 600 mg (11/04/22 1553)          Glade Lloyd, MD [facility] 11/05/2022, 7:33 AM

## 2022-11-05 NOTE — Plan of Care (Signed)
POC progressing.  

## 2022-11-05 NOTE — Progress Notes (Addendum)
   Palliative Medicine Inpatient Follow Up Note  HPI: 89 y.o. male   admitted on 10/22/2022 with history of a right to left femorofemoral bypass with common and external iliac artery stenting on the right with a known abdominal aortic aneurysm.  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 Palliative care support given patient acute on chronic disease burden.   Today's Discussion 11/05/2022  *Please note that this is a verbal dictation therefore any spelling or grammatical errors are due to the "[employer] One" system interpretation.  Chart reviewed inclusive of vital signs, progress notes, laboratory results, and diagnostic images.  I met with Edward Campos this morning. Edward Campos shares that he rested fairly well overnight. He is anticipating his TEE in the oncoming day or two though otherwise endorses no complaints. He is hopeful additional test do not yield unfavorable results. Provided time and space for him to express his concerns and offered therapeutic silence and reflection based upon what he shared.   Palliative Support Provided.   Objective Assessment: Vital Signs Vitals:   11/05/22 0026 11/05/22 0451  BP:  139/78  Pulse:  80  Resp:    Temp: 97.7 F (36.5 C) 97.7 F (36.5 C)  SpO2:  97%    Intake/Output Summary (Last 24 hours) at 11/05/2022 0753 Last data filed at 11/05/2022 0600 Gross per 24 hour  Intake --  Output 1770 ml  Net -1770 ml   Last Weight  Most recent update: 11/05/2022  4:55 AM    Weight  75.3 kg (166 lb 0.1 oz)            Gen:  Elderly Caucasian M in NAD HEENT: moist mucous membranes CV: Regular rate and rhythm  PULM: On RA, breathing is even and nonlabored ABD: soft/nontender  EXT: No edema  Neuro: Alert and oriented x3   SUMMARY OF RECOMMENDATIONS   Full Code/ Full Scope of Care   Edward Campos is hopeful to complete antibiotics and go to skilled nursing  Plan for TEE tomorrow  Goals are for ongoing improvement/recovery    Ongoing incremental PMT support  Billing based on MDM: Low ______________________________________________________________________________________ Edward Campos [place] Palliative Medicine Team Team Cell Phone: [phone] Please utilize secure chat with additional questions, if there is no response within 30 minutes please call the above phone number  Palliative Medicine Team providers are available by phone from 7am to 7pm daily and can be reached through the team cell phone.  Should this patient require assistance outside of these hours, please call the patient's attending physician.

## 2022-11-06 ENCOUNTER — Other Ambulatory Visit: Payer: Self-pay

## 2022-11-06 ENCOUNTER — Other Ambulatory Visit (HOSPITAL_COMMUNITY): Payer: Medicare Other

## 2022-11-06 DIAGNOSIS — T827XXA Infection and inflammatory reaction due to other cardiac and vascular devices, implants and grafts, initial encounter: Secondary | ICD-10-CM

## 2022-11-06 DIAGNOSIS — B9562 Methicillin resistant Staphylococcus aureus infection as the cause of diseases classified elsewhere: Secondary | ICD-10-CM | POA: Diagnosis not present

## 2022-11-06 DIAGNOSIS — R7881 Bacteremia: Secondary | ICD-10-CM | POA: Diagnosis not present

## 2022-11-06 DIAGNOSIS — A4102 Sepsis due to Methicillin resistant Staphylococcus aureus: Secondary | ICD-10-CM | POA: Diagnosis not present

## 2022-11-06 DIAGNOSIS — T827XXD Infection and inflammatory reaction due to other cardiac and vascular devices, implants and grafts, subsequent encounter: Secondary | ICD-10-CM | POA: Diagnosis not present

## 2022-11-06 HISTORY — DX: Infection and inflammatory reaction due to other cardiac and vascular devices, implants and grafts, initial encounter: T82.7XXA

## 2022-11-06 LAB — BASIC METABOLIC PANEL
Anion gap: 11 (ref 5–15)
BUN: 15 mg/dL (ref 8–23)
CO2: 20 mmol/L — ABNORMAL LOW (ref 22–32)
Calcium: 7.9 mg/dL — ABNORMAL LOW (ref 8.9–10.3)
Chloride: 96 mmol/L — ABNORMAL LOW (ref 98–111)
Creatinine, Ser: 0.86 mg/dL (ref 0.61–1.24)
GFR, Estimated: >60 mL/min (ref >60)
Glucose, Bld: 103 mg/dL — ABNORMAL HIGH (ref 70–99)
Potassium: 4.3 mmol/L (ref 3.5–5.1)
Sodium: 127 mmol/L — ABNORMAL LOW (ref 135–145)

## 2022-11-06 LAB — MAGNESIUM: Magnesium: 1.6 mg/dL — ABNORMAL LOW (ref 1.7–2.4)

## 2022-11-06 MED ORDER — SODIUM CHLORIDE 0.9 % IV SOLN: INTRAVENOUS | Status: DC

## 2022-11-06 MED ORDER — CHLORHEXIDINE GLUCONATE CLOTH 2 % EX PADS
6.0000 | MEDICATED_PAD | Freq: Every day | CUTANEOUS | Status: DC
  Administered 2022-11-06 – 2022-11-12 (×7): 6 via TOPICAL

## 2022-11-06 MED ORDER — SODIUM CHLORIDE 0.9% FLUSH: 10.0000 mL | INTRAVENOUS | Status: DC | PRN

## 2022-11-06 MED ORDER — SODIUM CHLORIDE 0.9% FLUSH
10.0000 mL | Freq: Two times a day (BID) | INTRAVENOUS | Status: DC
  Administered 2022-11-06 – 2022-11-11 (×10): 10 mL

## 2022-11-06 NOTE — Progress Notes (Signed)
[facility]  Date of Admission:  10/22/2022     Total days of antibiotics 14         ASSESSMENT:  Mr. Torrente is planned for TEE to rule out endocarditis. Discussed plan of care for antibiotics for at least 6 weeks with Daptomycin which is sensitive. Will need PICC line placed prior to discharge with current disposition to skilled nursing. Continue post-operative wound care per Vascular Surgery. Remaining medical and supportive care per Internal Medicine.   PLAN:  Continue current dose of daptomycin.  Await TEE results.  Post-operative wound care per Vascular Surgery.  Therapeutic drug monitoring of CK levels while on Daptomycin.  Place PICC line.  OPAT/Home Health orders pending TEE  Remaining medical and supportive care per Internal Medicine.  I have personally spent 24 minutes involved in face-to-face and non-face-to-face activities for this patient on the day of the visit. Professional time spent includes the following activities: Preparing to see the patient (review of tests), Obtaining and/or reviewing separately obtained history (admission/discharge record), Performing a medically appropriate examination and/or evaluation , Ordering medications/tests/procedures, referring and communicating with other health care professionals, Documenting clinical information in the EMR, Independently interpreting results (not separately reported), Communicating results to the patient/family/caregiver, Counseling and educating the patient/family/caregiver and Care coordination (not separately reported).    Principal Problem:   Sepsis due to methicillin resistant Staphylococcus aureus (MRSA) (HCC) Active Problems:   Peripheral vascular disease (HCC)   Infection of vascular bypass graft (HCC)   Small cell lung cancer, left lower lobe (HCC)   Sepsis (HCC)   Hypokalemia   Transaminitis   Hyponatremia   AAA (abdominal aortic aneurysm) (HCC)   PAD (peripheral artery  disease) (HCC)   MRSA bacteremia    sodium chloride   Intravenous Once   aspirin EC  81 mg Oral Daily   clopidogrel  75 mg Oral Daily   docusate sodium  100 mg Oral Daily   fluticasone  2 spray Each Nare Daily   guaiFENesin  1,200 mg Oral BID   heparin  5,000 Units Subcutaneous Q8H   multivitamin with minerals  1 tablet Oral Daily   pantoprazole  40 mg Oral Daily   sodium chloride flush  3 mL Intravenous Q12H   sodium chloride  1 g Oral TID WC    SUBJECTIVE:  Afebrile with no acute events. Frustrated with progression.   Allergies  Allergen Reactions   Penicillins Other (See Comments)     Review of Systems: Review of Systems  Constitutional:  Negative for chills, fever and weight loss.  Respiratory:  Negative for cough, shortness of breath and wheezing.   Cardiovascular:  Negative for chest pain and leg swelling.  Gastrointestinal:  Negative for abdominal pain, constipation, diarrhea, nausea and vomiting.  Skin:  Negative for rash.      OBJECTIVE: Vitals:   11/05/22 2021 11/06/22 0456 11/06/22 0724 11/06/22 1129  BP: 113/66  122/76 133/77  Pulse:  85 74 90  Resp:   17 20  Temp: 98.7 F (37.1 C) 97.8 F (36.6 C) 97.7 F (36.5 C) 98.2 F (36.8 C)  TempSrc: Oral Oral Oral Tympanic  SpO2:   96% 98%  Weight:  74.1 kg    Height:       Body mass index is 24.12 kg/m.  Physical Exam Constitutional:      General: He is not in acute distress.    Appearance: He is well-developed.  Cardiovascular:     Rate  and Rhythm: Normal rate and regular rhythm.     Heart sounds: Normal heart sounds.  Pulmonary:     Effort: Pulmonary effort is normal.     Breath sounds: Normal breath sounds.  Musculoskeletal:     Comments: Wound vac and in place with good suction.   Skin:    General: Skin is warm and dry.  Neurological:     Mental Status: He is alert.     Lab Results Lab Results  Component Value Date   WBC 9.0 11/03/2022   HGB 8.4 (L) 11/03/2022   HCT 25.1 (L)  11/03/2022   MCV 89.3 11/03/2022   PLT 367 11/03/2022    Lab Results  Component Value Date   CREATININE 0.86 11/06/2022   BUN 15 11/06/2022   NA 127 (L) 11/06/2022   K 4.3 11/06/2022   CL 96 (L) 11/06/2022   CO2 20 (L) 11/06/2022    Lab Results  Component Value Date   ALT 43 10/31/2022   AST 29 10/31/2022   ALKPHOS 66 10/31/2022   BILITOT 1.1 10/31/2022     Microbiology: Recent Results (from the past 240 hour(s))  Culture, blood (Routine X 2) w Reflex to ID Panel     Status: Abnormal   Collection Time: 10/27/22  2:46 PM   Specimen: BLOOD RIGHT HAND  Result Value Ref Range Status   Specimen Description BLOOD RIGHT HAND  Final   Special Requests   Final    BOTTLES DRAWN AEROBIC AND ANAEROBIC Blood Culture adequate volume   Culture  Setup Time   Final    GRAM POSITIVE COCCI IN BOTH AEROBIC AND ANAEROBIC BOTTLES CRITICAL VALUE NOTED.  VALUE IS CONSISTENT WITH PREVIOUSLY REPORTED AND CALLED VALUE.    Culture (A)  Final    STAPHYLOCOCCUS AUREUS SUSCEPTIBILITIES PERFORMED ON PREVIOUS CULTURE WITHIN THE LAST 5 DAYS. Performed at [facility], [address] [place], [place] [address]    Report Status 10/31/2022 FINAL  Final  Culture, blood (Routine X 2) w Reflex to ID Panel     Status: None   Collection Time: 10/27/22  2:47 PM   Specimen: BLOOD RIGHT ARM  Result Value Ref Range Status   Specimen Description BLOOD RIGHT ARM  Final   Special Requests   Final    BOTTLES DRAWN AEROBIC AND ANAEROBIC Blood Culture adequate volume   Culture   Final    NO GROWTH 5 DAYS Performed at [facility], [address] [place], [place] [address]    Report Status 11/01/2022 FINAL  Final  Culture, blood (Routine X 2) w Reflex to ID Panel     Status: None   Collection Time: 10/29/22 11:33 AM   Specimen: BLOOD LEFT ARM  Result Value Ref Range Status   Specimen Description BLOOD LEFT ARM  Final   Special Requests   Final    BOTTLES DRAWN AEROBIC AND ANAEROBIC Blood Culture  adequate volume   Culture   Final    NO GROWTH 5 DAYS Performed at [facility], [address] [place], [place] [address]    Report Status 11/03/2022 FINAL  Final  Culture, blood (Routine X 2) w Reflex to ID Panel     Status: None   Collection Time: 10/29/22 11:35 AM   Specimen: BLOOD  Result Value Ref Range Status   Specimen Description BLOOD RIGHT ANTECUBITAL  Final   Special Requests   Final    BOTTLES DRAWN AEROBIC AND ANAEROBIC Blood Culture  adequate volume   Culture   Final    NO GROWTH 5 DAYS Performed at [facility], [address] [place], [place] [address]    Report Status 11/03/2022 FINAL  Final  Minimum Inhibitory Conc. (1 Drug)     Status: None   Collection Time: 10/29/22 11:35 AM  Result Value Ref Range Status   Min Inhibitory Conc (1 Drug) Final report  Corrected    Comment: (NOTE) Performed At: [facility] [address] [place], [place] [id number] Jolene Schimke MD AO:[id number] CORRECTED ON 08/22 AT 1536: PREVIOUSLY REPORTED AS Preliminary report    Source CRE SAUR BLOOD  Final    Comment: Performed at [facility], [address], [place], [place] [address]     Marcos Eke, NP [facility]  11/06/2022  11:35 AM

## 2022-11-06 NOTE — Progress Notes (Signed)
Peripherally Inserted Central Catheter Placement  The IV Nurse has discussed with the patient and/or persons authorized to consent for the patient, the purpose of this procedure and the potential benefits and risks involved with this procedure.  The benefits include less needle sticks, lab draws from the catheter, and the patient may be discharged home with the catheter. Risks include, but not limited to, infection, bleeding, blood clot (thrombus formation), and puncture of an artery; nerve damage and irregular heartbeat and possibility to perform a PICC exchange if needed/ordered by physician.  Alternatives to this procedure were also discussed.  Bard Power PICC patient education guide, fact sheet on infection prevention and patient information card has been provided to patient /or left at bedside.  PICC inserted by Annett Fabian, RN   PICC Placement Documentation  PICC Single Lumen 11/06/22 Right Brachial 38 cm 0 cm (Active)  Indication for Insertion or Continuance of Line Prolonged intravenous therapies;Home intravenous therapies (PICC only) 11/06/22 1708  Exposed Catheter (cm) 0 cm 11/06/22 1708  Site Assessment Clean, Dry, Intact 11/06/22 1708  Line Status Flushed;Saline locked;Blood return noted 11/06/22 1708  Dressing Type Transparent;Securing device 11/06/22 1708  Dressing Status Antimicrobial disc in place;Clean, Dry, Intact 11/06/22 1708  Line Care Connections checked and tightened 11/06/22 1708  Line Adjustment (NICU/IV Team Only) No 11/06/22 1708  Dressing Intervention New dressing 11/06/22 1708  Dressing Change Due 11/13/22 11/06/22 1708       Edward Campos, Edward Campos 11/06/2022, 5:09 PM

## 2022-11-06 NOTE — TOC Progression Note (Signed)
Transition of Care [facility]) - Progression Note    Patient Details  Name: Edward Campos MRN: [id number] Date of Birth: 1932/12/18  Transition of Care [facility]) CM/SW Contact  [name], [place] Phone Number: 11/06/2022, 11:00 AM  Clinical Narrative:     Spoke with patient's daughter- resent SNF referrals to [facility] ( [facility]), [facility] and [facility].    Antony Blackbird, MSW, LCSW Clinical Social Worker    Expected Discharge Plan: Skilled Nursing Facility Barriers to Discharge: Continued Medical Work up  Expected Discharge Plan and Services   Discharge Planning Services: CM Consult Post Acute Care Choice: Home Health, Resumption of Svcs/PTA Provider, Skilled Nursing Facility Living arrangements for the past 2 months: Single Family Home                           HH Arranged: PT, OT, Nurse's Aide, RN, Social Work [employer] Agency: [facility] ([facility]) Date HH Agency Contacted: 10/26/22 Time HH Agency Contacted: 1458 Representative spoke with at [facility] Agency: Morrie Sheldon   Social Determinants of Health (SDOH) Interventions SDOH Screenings   Food Insecurity: No Food Insecurity (10/25/2022)  Housing: Low Risk  (10/25/2022)  Transportation Needs: No Transportation Needs (10/25/2022)  Utilities: Not At Risk (10/25/2022)  Tobacco Use: Medium Risk (10/31/2022)    Readmission Risk Interventions     No data to display

## 2022-11-06 NOTE — Progress Notes (Addendum)
Progress Note    11/06/2022 6:45 AM 6 Days Post-Op  Subjective:  resting in bed without complaints; says he is going for a TEE today.  afebrile  Vitals:   11/05/22 2021 11/06/22 0456  BP: 113/66   Pulse:  85  Resp:    Temp: 98.7 F (37.1 C) 97.8 F (36.6 C)  SpO2:      Physical Exam: General:  no distress Lungs:  non labored Incisions:  bilateral groins with wound vac with good seal Extremities:  bilateral feet are warm and well perfused.  Fem fem graft with + doppler signal Abdomen:  soft  CBC    Component Value Date/Time   WBC 9.0 11/03/2022 0408   RBC 2.81 (L) 11/03/2022 0408   HGB 8.4 (L) 11/03/2022 0408   HGB 13.9 10/27/2020 1038   HCT 25.1 (L) 11/03/2022 0408   HCT 41.0 10/27/2020 1038   PLT 367 11/03/2022 0408   PLT 208 10/27/2020 1038   MCV 89.3 11/03/2022 0408   MCV 90 10/27/2020 1038   MCH 29.9 11/03/2022 0408   MCHC 33.5 11/03/2022 0408   RDW 17.3 (H) 11/03/2022 0408   RDW 12.7 10/27/2020 1038   LYMPHSABS 0.6 (L) 11/01/2022 0415   MONOABS 0.9 11/01/2022 0415   EOSABS 0.0 11/01/2022 0415   BASOSABS 0.0 11/01/2022 0415    BMET    Component Value Date/Time   NA 129 (L) 11/05/2022 0910   NA 138 10/27/2020 1038   K 4.3 11/05/2022 0910   CL 97 (L) 11/05/2022 0910   CO2 23 11/05/2022 0910   GLUCOSE 111 (H) 11/05/2022 0910   BUN 15 11/05/2022 0910   BUN 17 10/27/2020 1038   CREATININE 0.85 11/05/2022 0910   CALCIUM 7.9 (L) 11/05/2022 0910   GFRNONAA >60 11/05/2022 0910   GFRAA >60 07/07/2019 0259    INR    Component Value Date/Time   INR 1.2 08/16/2022 0637     Intake/Output Summary (Last 24 hours) at 11/06/2022 0645 Last data filed at 11/06/2022 0456 Gross per 24 hour  Intake 720 ml  Output 1065 ml  Net -345 ml      Assessment/Plan:  89 y.o. male is s/p:  Femoral-femoral bypass graft excision Left common femoral vein primary repair Redo femorofemoral bypass using cadaveric greater saphenous vein Right common femoral to left  profunda with interposition bypass to the superficial femoral artery  10/25/2022 And  1) direct repair of right common femoral artery pseudoaneurysm 2) incision and drainage of left groin seroma 3) left sartorius flap to left groin 4) right sartorius flap to right groin 10/31/2022    6 Days Post-Op   -bilateral groins with vacs that have good seal.  JP drain on the left with 65cc/24 hrs.  Will probably keep in place today.   Pt remains at high risk for blow out from infection as the wound was grossly infected at initial graft excision. -bilateral feet are warm and well perfused.  He does have a + doppler signal in the fem fem graft.  -abx per ID as he did have +BC -DVT prophylaxis:  sq heparin; TED hose in place   Doreatha Massed, PA-C [facility] [phone] 11/06/2022 6:45 AM   VASCULAR STAFF ADDENDUM: I have independently interviewed and examined the patient. I agree with the above.  Possible drain removal tomorrow. Would like less than 50 output OOB as tolerated. Continue right-sided VAC dressing  Fara Olden, MD [facility] Phone Number: [phone] 11/06/2022 9:14 AM

## 2022-11-06 NOTE — TOC Progression Note (Signed)
Transition of Care [facility]) - Progression Note    Patient Details  Name: Edward Campos MRN: [id number] Date of Birth: 1932/08/07  Transition of Care [facility]) CM/SW Contact  [name], LCSW Phone Number: 11/06/2022, 9:59 AM  Clinical Narrative:     Sent message to [facility]/ [facility] to review for SNF placement.  'Antony Blackbird, MSW, LCSW Clinical Social Worker    Expected Discharge Plan: Skilled Nursing Facility Barriers to Discharge: Continued Medical Work up  Expected Discharge Plan and Services   Discharge Planning Services: CM Consult Post Acute Care Choice: Home Health, Resumption of Svcs/PTA Provider, Skilled Nursing Facility Living arrangements for the past 2 months: Single Family Home                           HH Arranged: PT, OT, Nurse's Aide, RN, Social Work [employer] Agency: [facility] ([facility]) Date HH Agency Contacted: 10/26/22 Time HH Agency Contacted: 1458 Representative spoke with at [facility] Agency: [facility]rie Sheldon   Social Determinants of Health (SDOH) Interventions SDOH Screenings   Food Insecurity: No Food Insecurity (10/25/2022)  Housing: Low Risk  (10/25/2022)  Transportation Needs: No Transportation Needs (10/25/2022)  Utilities: Not At Risk (10/25/2022)  Tobacco Use: Medium Risk (10/31/2022)    Readmission Risk Interventions     No data to display

## 2022-11-06 NOTE — Progress Notes (Signed)
   Palliative Medicine Inpatient Follow Up Note   The Palliative Care service has completed a chart review.  Plan for TEE today.  Plan for [facility] placement in the oncoming days if remains stable.  We will remain peripheral - please contact service directly if additional support is needed.  No Charge ______________________________________________________________________________________ Edward Campos  Palliative Medicine Team Team Cell Phone: [phone] Please utilize secure chat with additional questions, if there is no response within 30 minutes please call the above phone number  Palliative Medicine Team providers are available by phone from 7am to 7pm daily and can be reached through the team cell phone.  Should this patient require assistance outside of these hours, please call the patient's attending physician.

## 2022-11-06 NOTE — Progress Notes (Signed)
PROGRESS NOTE    Edward Campos  ZOX:[id number] DOB: Sep 20, 1932 DOA: 10/22/2022 PCP: Patient, No Pcp Per   Brief Narrative:  89 y.o. male with past medical history of HLD, PVD s/p bypass grafting, AAA, SCLC hospitalization in July and 24 for infected right to left femoral to femoral bypass graft requiring temporary wound VAC placement who presented as a direct admission to the vascular service for concern a infected femoral bypass with fevers at home. CT angiogram of the abdomen pelvis noted right bifemoral bypass graft with fluid/inflammatory changes associated with the femorofemoral bypass most prominent on the left compatible with a graft infection, and a 5.8 infrarenal abdominal aortic aneurysm grossly unchanged in size.  He was started on broad-spectrum antibiotics.  Subsequently, [facility] took over as primary team.  He underwent vascular surgery intervention with wound VAC placement on 10/25/2022.  Blood cultures on 10/23/2022 grew MRSA.  ID consulted and currently following: Currently on IV daptomycin.  Patient underwent direct repair of right common femoral artery pseudoaneurysm; I&D of left groin seroma, left sartorius flap to left groin and right sartorius flap to right groin by vascular surgery on 10/31/2022.  He is planned for TEE today.  Assessment & Plan:   Sepsis: Present on admission Infected left femoral graft MRSA bacteremia -Currently on daptomycin as per ID.  Patient will need at least 6 weeks of antibiotics as per ID.  Repeat blood cultures have been negative so far. -2D echo showed no vegetation.  ID is recommending TEE.  Cardiology planning for TEE possibly today. -Patient underwent vascular surgery intervention with wound VAC placement on 10/25/2022.   -Currently afebrile and hemodynamically stable -underwent direct repair of right common femoral artery pseudoaneurysm; I&D of left groin seroma, left sartorius flap to left groin and right sartorius flap to right groin by vascular  surgery on 10/31/2022.  Wound and VAC care as per vascular surgery.  Acute on chronic hyponatremia -Baseline sodium of 128-131.  Presented with sodium of 122. -Sodium 129 on 11/05/2022.  Off IV fluids.  Continue salt tablets.  Continue fluid restriction.  Labs pending for today.  Repeat a.m. labs.  Small cell cancer with bony metastases Goals of care -Noted started on chemotherapy because of issues with wound not healing.  Outpatient follow-up with oncology -Palliative care following.  Currently listed as full code.  Overall prognosis is guarded to poor.  Leukocytosis -Resolved  Anemia of chronic disease -Hemoglobin currently stable.  Monitor intermittently  Hypokalemia -Resolved  Hypomagnesemia -Labs pending for today.  Elevated LFTs -Resolved.  Crestor has been discontinued  Hyperlipidemia -Crestor on hold.  Possibly resume on discharge  PAD -Continue aspirin.  Statin on hold.  Continue Plavix  AAA --Patient with 5.8 x 5.7 cm infrarenal abdominal aortic aneurysm unchanged in size. -Vascular surgery following.  Physical deconditioning -PT following and recommending SNF.     DVT prophylaxis: Heparin subcutaneous Code Status: Full Family Communication: Daughter on phone Disposition Plan: Status is: Inpatient Remains inpatient appropriate because: Of severity of illness  Consultants: Vascular surgery/palliative care/ID  Procedures: As above  Antimicrobials:  Anti-infectives (From admission, onward)    Start     Dose/Rate Route Frequency Ordered Stop   10/26/22 0945  DAPTOmycin (CUBICIN) 600 mg in sodium chloride 0.9 % IVPB        8 mg/kg  75.9 kg 124 mL/hr over 30 Minutes Intravenous Daily 10/26/22 0855     10/26/22 0945  cefTRIAXone (ROCEPHIN) 2 g in sodium chloride 0.9 % 100 mL IVPB  Status:  Discontinued        2 g 200 mL/hr over 30 Minutes Intravenous Every 24 hours 10/26/22 0855 10/26/22 0905   10/25/22 1006  ceFAZolin 1 g / gentamicin 80 mg in NS 500  mL surgical irrigation  Status:  Discontinued          As needed 10/25/22 1006 10/25/22 1318   10/23/22 1145  vancomycin (VANCOREADY) IVPB 1250 mg/250 mL  Status:  Discontinued        1,250 mg 166.7 mL/hr over 90 Minutes Intravenous Every 24 hours 10/23/22 1051 10/26/22 0855   10/23/22 1100  ceFEPIme (MAXIPIME) 2 g in sodium chloride 0.9 % 100 mL IVPB  Status:  Discontinued        2 g 200 mL/hr over 30 Minutes Intravenous Every 12 hours 10/23/22 1041 10/26/22 0855        Subjective: Patient seen and examined at bedside.  Denies worsening shortness of breath, vomiting, fever or abdominal pain.   Objective: Vitals:   11/05/22 1534 11/05/22 2021 11/06/22 0456 11/06/22 0724  BP: 132/74 113/66  122/76  Pulse: 93  85 74  Resp: 20   17  Temp: 98.1 F (36.7 C) 98.7 F (37.1 C) 97.8 F (36.6 C) 97.7 F (36.5 C)  TempSrc: Oral Oral Oral Oral  SpO2: 94%   96%  Weight:   74.1 kg   Height:        Intake/Output Summary (Last 24 hours) at 11/06/2022 0754 Last data filed at 11/06/2022 0657 Gross per 24 hour  Intake 482 ml  Output 1075 ml  Net -593 ml   Filed Weights   11/04/22 0359 11/05/22 0451 11/06/22 0456  Weight: 72.1 kg 75.3 kg 74.1 kg    Examination:  General: Currently in no distress.  Currently on room air.  Chronically ill and deconditioned looking. ENT/neck: No obvious palpable neck masses or elevated JVD noted respiratory: Bilateral decreased breath sounds at bases with scattered crackles  CVS: Rate controlled currently; S1 and S2 heard  abdominal: Soft, nontender, distended mildly; no organomegaly, bowel sound normally heard  extremities: No clubbing; mild lower extremity edema present  CNS: Alert and awake.  Still slow to respond.  No obvious focal deficits noted.   Lymph: No palpable lymphadenopathy noted  skin: No obvious ecchymosis/lesions psych: Not agitated currently.  Affect is mostly flat  musculoskeletal: No obvious joint erythema/tenderness  Data  Reviewed: I have personally reviewed following labs and imaging studies  CBC: Recent Labs  Lab 10/31/22 0701 10/31/22 1715 11/01/22 0415 11/02/22 0429 11/03/22 0408  WBC 9.6  --  15.0* 9.1 9.0  NEUTROABS 7.2  --  12.9*  --   --   HGB 7.5* 6.5* 8.8* 8.5* 8.4*  HCT 22.0* 19.0* 25.5* 24.9* 25.1*  MCV 85.9  --  85.0 86.5 89.3  PLT 353  --  342 373 367   Basic Metabolic Panel: Recent Labs  Lab 10/31/22 0701 10/31/22 1715 11/01/22 0415 11/02/22 0429 11/03/22 0408 11/04/22 0350 11/05/22 0910  NA 128*   < > 127* 126* 128* 130* 129*  K 3.6   < > 4.2 4.5 3.8 3.9 4.3  CL 96*  --  97* 94* 96* 98 97*  CO2 22  --  20* 24 24 24 23   GLUCOSE 96  --  104* 114* 102* 108* 111*  BUN 7*  --  14 20 17 16 15   CREATININE 0.69  --  0.82 0.95 0.80 0.77 0.85  CALCIUM 7.5*  --  7.6*  7.5* 7.6* 7.7* 7.9*  MG 1.7  --  1.7 1.9  --  1.6* 1.7  PHOS 2.3*  --   --   --   --   --   --    < > = values in this interval not displayed.   GFR: Estimated Creatinine Clearance: 61.2 mL/min (by C-G formula based on SCr of 0.85 mg/dL). Liver Function Tests: Recent Labs  Lab 10/31/22 0701  AST 29  ALT 43  ALKPHOS 66  BILITOT 1.1  PROT 5.3*  ALBUMIN 2.2*   No results for input(s): "LIPASE", "AMYLASE" in the last 168 hours. No results for input(s): "AMMONIA" in the last 168 hours. Coagulation Profile: No results for input(s): "INR", "PROTIME" in the last 168 hours. Cardiac Enzymes: Recent Labs  Lab 11/02/22 0429  CKTOTAL 113   BNP (last 3 results) No results for input(s): "PROBNP" in the last 8760 hours. HbA1C: No results for input(s): "HGBA1C" in the last 72 hours. CBG: No results for input(s): "GLUCAP" in the last 168 hours. Lipid Profile: No results for input(s): "CHOL", "HDL", "LDLCALC", "TRIG", "CHOLHDL", "LDLDIRECT" in the last 72 hours. Thyroid Function Tests: No results for input(s): "TSH", "T4TOTAL", "FREET4", "T3FREE", "THYROIDAB" in the last 72 hours.  Anemia Panel: No results for  input(s): "VITAMINB12", "FOLATE", "FERRITIN", "TIBC", "IRON", "RETICCTPCT" in the last 72 hours.  Sepsis Labs: No results for input(s): "PROCALCITON", "LATICACIDVEN" in the last 168 hours.  Recent Results (from the past 240 hour(s))  Culture, blood (Routine X 2) w Reflex to ID Panel     Status: Abnormal   Collection Time: 10/27/22  2:46 PM   Specimen: BLOOD RIGHT HAND  Result Value Ref Range Status   Specimen Description BLOOD RIGHT HAND  Final   Special Requests   Final    BOTTLES DRAWN AEROBIC AND ANAEROBIC Blood Culture adequate volume   Culture  Setup Time   Final    GRAM POSITIVE COCCI IN BOTH AEROBIC AND ANAEROBIC BOTTLES CRITICAL VALUE NOTED.  VALUE IS CONSISTENT WITH PREVIOUSLY REPORTED AND CALLED VALUE.    Culture (A)  Final    STAPHYLOCOCCUS AUREUS SUSCEPTIBILITIES PERFORMED ON PREVIOUS CULTURE WITHIN THE LAST 5 DAYS. Performed at [facility], [address], [place], [place] [address]    Report Status 10/31/2022 FINAL  Final  Culture, blood (Routine X 2) w Reflex to ID Panel     Status: None   Collection Time: 10/27/22  2:47 PM   Specimen: BLOOD RIGHT ARM  Result Value Ref Range Status   Specimen Description BLOOD RIGHT ARM  Final   Special Requests   Final    BOTTLES DRAWN AEROBIC AND ANAEROBIC Blood Culture adequate volume   Culture   Final    NO GROWTH 5 DAYS Performed at [facility], [address], [place], [place] [address]    Report Status 11/01/2022 FINAL  Final  Culture, blood (Routine X 2) w Reflex to ID Panel     Status: None   Collection Time: 10/29/22 11:33 AM   Specimen: BLOOD LEFT ARM  Result Value Ref Range Status   Specimen Description BLOOD LEFT ARM  Final   Special Requests   Final    BOTTLES DRAWN AEROBIC AND ANAEROBIC Blood Culture adequate volume   Culture   Final    NO GROWTH 5 DAYS Performed at [facility], [address], [place], [place] [address]    Report Status 11/03/2022 FINAL  Final  Culture, blood (Routine  X 2) w Reflex to ID Panel     Status: None   Collection Time: 10/29/22 11:35 AM   Specimen: BLOOD  Result Value Ref Range Status   Specimen Description BLOOD RIGHT ANTECUBITAL  Final   Special Requests   Final    BOTTLES DRAWN AEROBIC AND ANAEROBIC Blood Culture adequate volume   Culture   Final    NO GROWTH 5 DAYS Performed at [facility], [address], [place], [place] [address]    Report Status 11/03/2022 FINAL  Final  Minimum Inhibitory Conc. (1 Drug)     Status: None   Collection Time: 10/29/22 11:35 AM  Result Value Ref Range Status   Min Inhibitory Conc (1 Drug) Final report  Corrected    Comment: (NOTE) Performed At: [facility] [address] [place], [place] [id number] Jolene Schimke MD XB:[id number] CORRECTED ON 08/22 AT 1536: PREVIOUSLY REPORTED AS Preliminary report    Source CRE SAUR BLOOD  Final    Comment: Performed at [facility], [address], [place], [place] [address]         Radiology Studies: No results found.      Scheduled Meds:  sodium chloride   Intravenous Once   aspirin EC  81 mg Oral Daily   clopidogrel  75 mg Oral Daily   docusate sodium  100 mg Oral Daily   fluticasone  2 spray Each Nare Daily   guaiFENesin  1,200 mg Oral BID   heparin  5,000 Units Subcutaneous Q8H   multivitamin with minerals  1 tablet Oral Daily   pantoprazole  40 mg Oral Daily   sodium chloride flush  3 mL Intravenous Q12H   sodium chloride  1 g Oral TID WC   Continuous Infusions:  sodium chloride     sodium chloride     sodium chloride 20 mL/hr at 11/06/22 0651   DAPTOmycin (CUBICIN) 600 mg in sodium chloride 0.9 % IVPB 600 mg (11/05/22 1350)          Glade Lloyd, MD [facility] 11/06/2022, 7:54 AM

## 2022-11-07 ENCOUNTER — Encounter (HOSPITAL_COMMUNITY): Payer: Self-pay | Admitting: Internal Medicine

## 2022-11-07 ENCOUNTER — Inpatient Hospital Stay (HOSPITAL_COMMUNITY): Payer: Medicare Other

## 2022-11-07 ENCOUNTER — Encounter (HOSPITAL_COMMUNITY)
Admission: AD | Disposition: A | Discharge status: Skilled Nursing Facility | Payer: Self-pay | Source: Ambulatory Visit | Attending: Internal Medicine

## 2022-11-07 ENCOUNTER — Inpatient Hospital Stay (HOSPITAL_COMMUNITY): Payer: Medicare Other | Admitting: Registered Nurse

## 2022-11-07 DIAGNOSIS — I340 Nonrheumatic mitral (valve) insufficiency: Secondary | ICD-10-CM

## 2022-11-07 DIAGNOSIS — T827XXA Infection and inflammatory reaction due to other cardiac and vascular devices, implants and grafts, initial encounter: Secondary | ICD-10-CM | POA: Diagnosis not present

## 2022-11-07 DIAGNOSIS — R7881 Bacteremia: Secondary | ICD-10-CM | POA: Diagnosis not present

## 2022-11-07 DIAGNOSIS — T827XXD Infection and inflammatory reaction due to other cardiac and vascular devices, implants and grafts, subsequent encounter: Secondary | ICD-10-CM | POA: Diagnosis not present

## 2022-11-07 DIAGNOSIS — B9562 Methicillin resistant Staphylococcus aureus infection as the cause of diseases classified elsewhere: Secondary | ICD-10-CM | POA: Diagnosis not present

## 2022-11-07 DIAGNOSIS — I483 Typical atrial flutter: Secondary | ICD-10-CM

## 2022-11-07 HISTORY — PX: TEE WITHOUT CARDIOVERSION: SHX5443

## 2022-11-07 LAB — BASIC METABOLIC PANEL
Anion gap: 11 (ref 5–15)
BUN: 14 mg/dL (ref 8–23)
CO2: 24 mmol/L (ref 22–32)
Calcium: 8.1 mg/dL — ABNORMAL LOW (ref 8.9–10.3)
Chloride: 95 mmol/L — ABNORMAL LOW (ref 98–111)
Creatinine, Ser: 0.8 mg/dL (ref 0.61–1.24)
GFR, Estimated: >60 mL/min (ref >60)
Glucose, Bld: 114 mg/dL — ABNORMAL HIGH (ref 70–99)
Potassium: 3.9 mmol/L (ref 3.5–5.1)
Sodium: 130 mmol/L — ABNORMAL LOW (ref 135–145)

## 2022-11-07 LAB — CBC
HCT: 25.7 % — ABNORMAL LOW (ref 39.0–52.0)
Hemoglobin: 8.5 g/dL — ABNORMAL LOW (ref 13.0–17.0)
MCH: 29.2 pg (ref 26.0–34.0)
MCHC: 33.1 g/dL (ref 30.0–36.0)
MCV: 88.3 fL (ref 80.0–100.0)
Platelets: 499 10*3/uL — ABNORMAL HIGH (ref 150–400)
RBC: 2.91 MIL/uL — ABNORMAL LOW (ref 4.22–5.81)
RDW: 18.1 % — ABNORMAL HIGH (ref 11.5–15.5)
WBC: 13 10*3/uL — ABNORMAL HIGH (ref 4.0–10.5)
nRBC: 0 % (ref 0.0–0.2)

## 2022-11-07 LAB — MAGNESIUM: Magnesium: 1.7 mg/dL (ref 1.7–2.4)

## 2022-11-07 SURGERY — ECHOCARDIOGRAM, TRANSESOPHAGEAL
Anesthesia: Monitor Anesthesia Care

## 2022-11-07 MED ORDER — PHENYLEPHRINE HCL (PRESSORS) 10 MG/ML IV SOLN
INTRAVENOUS | Status: DC | PRN
  Administered 2022-11-07 (×2): 160 ug via INTRAVENOUS

## 2022-11-07 MED ORDER — LIDOCAINE 2% (20 MG/ML) 5 ML SYRINGE
INTRAMUSCULAR | Status: DC | PRN
  Administered 2022-11-07: 60 mg via INTRAVENOUS

## 2022-11-07 MED ORDER — SODIUM CHLORIDE 0.9 % IV SOLN: INTRAVENOUS | Status: DC | PRN

## 2022-11-07 MED ORDER — PROPOFOL 10 MG/ML IV BOLUS
INTRAVENOUS | Status: DC | PRN
  Administered 2022-11-07: 100 ug/kg/min via INTRAVENOUS
  Administered 2022-11-07: 40 mg via INTRAVENOUS
  Administered 2022-11-07: 30 mg via INTRAVENOUS

## 2022-11-07 NOTE — Progress Notes (Signed)
  Echocardiogram Echocardiogram Transesophageal has been performed.  Delcie Roch 11/07/2022, 4:03 PM

## 2022-11-07 NOTE — Progress Notes (Signed)
[facility]                                                                              Makar Vongphakdy, is a 89 y.o. male, DOB - April 01, 1932, EPP:[id number] Admit date - 10/22/2022    Outpatient Primary MD for the patient is Patient, No Pcp Per  LOS - 16  days  No chief complaint on file.      Brief summary   Patient is a 89 y.o. male with HLD, PVD s/p bypass grafting, AAA, SCLC hospitalization in July 2024 for infected right to left femoral to femoral bypass graft requiring temporary wound VAC placement who presented with concern of infected femoral bypass with fevers at home.  CT angio abdomen pelvis noted right bifemoral bypass graft with fluid/inflammatory changes associated with the femorofemoral bypass most prominent on the left compatible with a graft infection, and a 5.8 infrarenal abdominal aortic aneurysm grossly unchanged in size. He was started on broad-spectrum antibiotics. He underwent vascular surgery intervention with wound VAC placement on 10/25/2022. Blood cultures on 10/23/2022 grew MRSA. ID was consulted, on IV daptomycin.  Patient underwent direct repair of right common femoral artery pseudoaneurysm; I&D of left groin seroma, left sartorius flap to left groin and right sartorius flap to right groin by vascular surgery on 10/31/2022.    Assessment & Plan    Principal Problem:   Sepsis due to methicillin resistant Staphylococcus aureus (MRSA) (HCC) MRSA bacteremia, POA - Patient met criteria for sepsis on admission with tachycardia, leukocytosis, fevers of 101 Fahrenheit prior to admission.  -CT abdomen pelvis showed right bifemoral bypass graft with fluid/inflammatory changes associated with femoral-femoral bypass most prominent on the left compatible with graft infection. - blood cultures positive for MRSA.  - seen by vascular surgery, underwent femoral-femoral bypass graft excision, left common femoral vein primary repair, redo femoral-femoral bypass  using cadaveric great saphenous vein, right common femoral to left profunda with interposition bypass to the superficial femoral artery on 10/25/2022  -Noted to have significant purulence in left groin, underwent direct repair of right common femoral artery pseudoaneurysm, incision and drainage of left groin seroma, left sartorius flap to left groin, right sartorius flap to right groin on 10/31/2022 -Vascular surgery closely following, has bilateral groins vacs, JP drain on the left.  Still remains high risk for blowout from infection as wound was grossly infected at initial graft excision. -Wound cultures positive for MRSA.  ID following, continue IV daptomycin -2D echo showed normal EF, no regional WMA, diastolic dysfunction, no vegetations noted -TEE pending today -Plan for possible drain removal tomorrow  Active problems  Acute on chronic hyponatremia -Baseline sodium 128-131. -On initial presentation sodium noted at 122, history of small cell lung CA -Urine osmolality 467, urine sodium 89, likely component of SIADH -Currently improving, continue salt tabs 1 g 3 times daily   Hypokalemia/hypophosphatemia -Currently stable, continue supplementation as needed  Small cell lung cancer with bone metastases -History of stage IV lung cancer with disease to the bone and prior postobstructive pneumonia. -CT scan showed branching mass like opacity measuring 5.2 x 7.9 cm in the left hilar region/central left lower lobe  similar in size to prior. -Chemotherapy noted not to have been initiated due to wounds not being healed yet.  -Outpatient follow-up with primary oncologist, palliative following for goals of care     PAD -Continue aspirin, Plavix -Statin held due to transaminitis.  -Vascular surgery following.     Transaminitis -Improved -CT angiogram abdomen and pelvis with no acute abnormalities noted in the left. -Acute hepatitis panel negative.  Crestor on hold      Hyperlipidemia -Crestor held secondary to rising LFTs.   -LFTs trending down.   -Likely resume Crestor on discharge.   AAA -Patient with 5.8 x 5.7 cm infrarenal abdominal aortic aneurysm unchanged in size. -Vascular surgery following.  Estimated body mass index is 24.19 kg/m as calculated from the following:   Height as of this encounter: 5\' 9"  (1.753 m).   Weight as of this encounter: 74.3 kg.  Code Status: Full code DVT Prophylaxis:  SCD's Start: 10/25/22 1427 heparin injection 5,000 Units Start: 10/22/22 1600 SCDs Start: 10/22/22 1501   Level of Care: Level of care: Progressive Family Communication: Updated patient Disposition Plan:      Remains inpatient appropriate: TEE today   Procedures:  10/25/2022 Femoral-femoral bypass graft excision Left common femoral vein primary repair Redo femorofemoral bypass using cadaveric greater saphenous vein Right common femoral to left profunda with interposition bypass to the superficial femoral artery   10/31/2022 1) direct repair of right common femoral artery pseudoaneurysm 2) incision and drainage of left groin seroma 3) left sartorius flap to left groin 4) right sartorius flap to right groin  2D echo  Consultants:   Vascular surgery Infectious disease Palliative medicine  Antimicrobials:   Anti-infectives (From admission, onward)    Start     Dose/Rate Route Frequency Ordered Stop   10/26/22 0945  DAPTOmycin (CUBICIN) 600 mg in sodium chloride 0.9 % IVPB        8 mg/kg  75.9 kg 124 mL/hr over 30 Minutes Intravenous Daily 10/26/22 0855     10/26/22 0945  cefTRIAXone (ROCEPHIN) 2 g in sodium chloride 0.9 % 100 mL IVPB  Status:  Discontinued        2 g 200 mL/hr over 30 Minutes Intravenous Every 24 hours 10/26/22 0855 10/26/22 0905   10/25/22 1006  ceFAZolin 1 g / gentamicin 80 mg in NS 500 mL surgical irrigation  Status:  Discontinued          As needed 10/25/22 1006 10/25/22 1318   10/23/22 1145  vancomycin  (VANCOREADY) IVPB 1250 mg/250 mL  Status:  Discontinued        1,250 mg 166.7 mL/hr over 90 Minutes Intravenous Every 24 hours 10/23/22 1051 10/26/22 0855   10/23/22 1100  ceFEPIme (MAXIPIME) 2 g in sodium chloride 0.9 % 100 mL IVPB  Status:  Discontinued        2 g 200 mL/hr over 30 Minutes Intravenous Every 12 hours 10/23/22 1041 10/26/22 0855          Medications  sodium chloride   Intravenous Once   aspirin EC  81 mg Oral Daily   Chlorhexidine Gluconate Cloth  6 each Topical Daily   clopidogrel  75 mg Oral Daily   docusate sodium  100 mg Oral Daily   fluticasone  2 spray Each Nare Daily   guaiFENesin  1,200 mg Oral BID   heparin  5,000 Units Subcutaneous Q8H   multivitamin with minerals  1 tablet Oral Daily   pantoprazole  40 mg Oral Daily  sodium chloride flush  10-40 mL Intracatheter Q12H   sodium chloride flush  3 mL Intravenous Q12H   sodium chloride  1 g Oral TID WC      Subjective:   Edward Campos was seen and examined today.  Waiting for TEE today, no acute complaints.  Wound vacs in place.  Patient denies dizziness, chest pain, shortness of breath, abdominal pain, N/V/. No acute events overnight.    Objective:   Vitals:   11/06/22 2012 11/06/22 2344 11/07/22 0407 11/07/22 0408  BP: 92/63 120/66 134/75 134/75  Pulse:  82  81  Resp: 20  16 16   Temp: 98.1 F (36.7 C) 98 F (36.7 C)  97.7 F (36.5 C)  TempSrc: Oral Oral  Oral  SpO2: 98% 98%  98%  Weight:    74.3 kg  Height:        Intake/Output Summary (Last 24 hours) at 11/07/2022 1011 Last data filed at 11/07/2022 0865 Gross per 24 hour  Intake 898.53 ml  Output 1952 ml  Net -1053.47 ml     Wt Readings from Last 3 Encounters:  11/07/22 74.3 kg  10/22/22 73.7 kg  10/03/22 74.4 kg     Exam General: Alert and oriented x 3, NAD Cardiovascular: S1 S2 auscultated,  RRR Respiratory: Clear to auscultation bilaterally, no wheezing Gastrointestinal: Soft, nontender, nondistended, + bowel  sounds Ext: no pedal edema bilaterally Neuro: Strength 5/5 upper and lower extremities bilaterally Skin: Bilateral groin wound vacs in place, left drain Psych: Normal affect     Data Reviewed:  I have personally reviewed following labs    CBC Lab Results  Component Value Date   WBC 13.0 (H) 11/07/2022   RBC 2.91 (L) 11/07/2022   HGB 8.5 (L) 11/07/2022   HCT 25.7 (L) 11/07/2022   MCV 88.3 11/07/2022   MCH 29.2 11/07/2022   PLT 499 (H) 11/07/2022   MCHC 33.1 11/07/2022   RDW 18.1 (H) 11/07/2022   LYMPHSABS 0.6 (L) 11/01/2022   MONOABS 0.9 11/01/2022   EOSABS 0.0 11/01/2022   BASOSABS 0.0 11/01/2022     Last metabolic panel Lab Results  Component Value Date   NA 130 (L) 11/07/2022   K 3.9 11/07/2022   CL 95 (L) 11/07/2022   CO2 24 11/07/2022   BUN 14 11/07/2022   CREATININE 0.80 11/07/2022   GLUCOSE 114 (H) 11/07/2022   GFRNONAA >60 11/07/2022   GFRAA >60 07/07/2019   CALCIUM 8.1 (L) 11/07/2022   PHOS 2.3 (L) 10/31/2022   PROT 5.3 (L) 10/31/2022   ALBUMIN 2.2 (L) 10/31/2022   BILITOT 1.1 10/31/2022   ALKPHOS 66 10/31/2022   AST 29 10/31/2022   ALT 43 10/31/2022   ANIONGAP 11 11/07/2022    CBG (last 3)  No results for input(s): "GLUCAP" in the last 72 hours.    Coagulation Profile: No results for input(s): "INR", "PROTIME" in the last 168 hours.   Radiology Studies: I have personally reviewed the imaging studies  [place] EKG SITE RITE  Result Date: 11/06/2022 If Site Rite image not attached, placement could not be confirmed due to current cardiac rhythm.      Thad Ranger M.D. [facility] 11/07/2022, 10:11 AM  Available via Epic secure chat 7am-7pm After 7 pm, please refer to night coverage provider listed on amion.

## 2022-11-07 NOTE — Plan of Care (Signed)
  Problem: Education: Goal: Knowledge of General Education information will improve Description: Including pain rating scale, medication(s)/side effects and non-pharmacologic comfort measures Outcome: Progressing   Problem: Health Behavior/Discharge Planning: Goal: Ability to manage health-related needs will improve Outcome: Progressing   Problem: Clinical Measurements: Goal: Ability to maintain clinical measurements within normal limits will improve Outcome: Progressing Goal: Will remain free from infection Outcome: Progressing Goal: Diagnostic test results will improve Outcome: Progressing Goal: Respiratory complications will improve Outcome: Progressing Goal: Cardiovascular complication will be avoided Outcome: Progressing   Problem: Activity: Goal: Risk for activity intolerance will decrease Outcome: Progressing   Problem: Nutrition: Goal: Adequate nutrition will be maintained Outcome: Progressing   Problem: Coping: Goal: Level of anxiety will decrease Outcome: Progressing   Problem: Elimination: Goal: Will not experience complications related to bowel motility Outcome: Progressing Goal: Will not experience complications related to urinary retention Outcome: Progressing   Problem: Safety: Goal: Ability to remain free from injury will improve Outcome: Progressing   Problem: Skin Integrity: Goal: Risk for impaired skin integrity will decrease Outcome: Progressing   Problem: Education: Goal: Knowledge of prescribed regimen will improve Outcome: Progressing   Problem: Activity: Goal: Ability to tolerate increased activity will improve Outcome: Progressing   Problem: Bowel/Gastric: Goal: Gastrointestinal status for postoperative course will improve Outcome: Progressing   Problem: Clinical Measurements: Goal: Postoperative complications will be avoided or minimized Outcome: Progressing Goal: Signs and symptoms of graft occlusion will improve Outcome:  Progressing   Problem: Skin Integrity: Goal: Demonstration of wound healing without infection will improve Outcome: Progressing   Problem: Fluid Volume: Goal: Hemodynamic stability will improve Outcome: Progressing   Problem: Clinical Measurements: Goal: Diagnostic test results will improve Outcome: Progressing Goal: Signs and symptoms of infection will decrease Outcome: Progressing   Problem: Respiratory: Goal: Ability to maintain adequate ventilation will improve Outcome: Progressing

## 2022-11-07 NOTE — Interval H&P Note (Signed)
History and Physical Interval Note:  11/07/2022 1:36 PM  Edward Campos  has presented today for surgery, with the diagnosis of bactermia.  The various methods of treatment have been discussed with the patient and family. After consideration of risks, benefits and other options for treatment, the patient has consented to  Procedure(s): TRANSESOPHAGEAL ECHOCARDIOGRAM (N/A) as a surgical intervention.  The patient's history has been reviewed, patient examined, no change in status, stable for surgery.  I have reviewed the patient's chart and labs.  Questions were answered to the patient's satisfaction.     [name] Cristal Deer

## 2022-11-07 NOTE — Transfer of Care (Signed)
Immediate Anesthesia Transfer of Care Note  Patient: Edward Campos  Procedure(s) Performed: TRANSESOPHAGEAL ECHOCARDIOGRAM  Patient Location: PACU  Anesthesia Type:MAC  Level of Consciousness: drowsy  Airway & Oxygen Therapy: Patient connected to nasal cannula oxygen  Post-op Assessment: Report given to RN and Post -op Vital signs reviewed and stable  Post vital signs: Reviewed and stable  Last Vitals:  Vitals Value Taken Time  BP 115/70   Temp    Pulse 82   Resp    SpO2 98     Last Pain:  Vitals:   11/07/22 1142  TempSrc: Oral  PainSc: 0-No pain      Patients Stated Pain Goal: 0 (11/02/22 0954)  Complications: There were no known notable events for this encounter.

## 2022-11-07 NOTE — Progress Notes (Signed)
OT Cancellation Note  Patient Details Name: Rhyland Dekay MRN: [id number] DOB: 1932/09/06   Cancelled Treatment:    Reason Eval/Treat Not Completed: Patient at procedure or test/ unavailable (Pt currently off unit for transesophageal echocardiogram. OT to reattempt to see pt for skilled OT treatment at a later time as appropriate/available.)  Jensyn Shave "Orson Eva., OTR/L, MA Acute Rehab [phone]  Lendon Colonel 11/07/2022, 1:55 PM

## 2022-11-07 NOTE — Progress Notes (Signed)
  Progress Note    11/07/2022 6:42 AM 7 Days Post-Op  Subjective:  no complaints.  Says he didn't get his test done yesterday and has to get it today. Feet feel fine.   afebrile  Vitals:   11/07/22 0407 11/07/22 0408  BP: 134/75 134/75  Pulse:  81  Resp: 16 16  Temp:  97.7 F (36.5 C)  SpO2:  98%    Physical Exam: General:  no distress Cardiac:  irregular Lungs:  non labored Incisions:  bilateral groins with prevena vacs in place with good seal.   Extremities:  bilateral feet are warm.  + doppler signal in fem fem BPG. Abdomen:  soft  CBC    Component Value Date/Time   WBC 9.0 11/03/2022 0408   RBC 2.81 (L) 11/03/2022 0408   HGB 8.4 (L) 11/03/2022 0408   HGB 13.9 10/27/2020 1038   HCT 25.1 (L) 11/03/2022 0408   HCT 41.0 10/27/2020 1038   PLT 367 11/03/2022 0408   PLT 208 10/27/2020 1038   MCV 89.3 11/03/2022 0408   MCV 90 10/27/2020 1038   MCH 29.9 11/03/2022 0408   MCHC 33.5 11/03/2022 0408   RDW 17.3 (H) 11/03/2022 0408   RDW 12.7 10/27/2020 1038   LYMPHSABS 0.6 (L) 11/01/2022 0415   MONOABS 0.9 11/01/2022 0415   EOSABS 0.0 11/01/2022 0415   BASOSABS 0.0 11/01/2022 0415    BMET    Component Value Date/Time   NA 130 (L) 11/07/2022 0432   NA 138 10/27/2020 1038   K 3.9 11/07/2022 0432   CL 95 (L) 11/07/2022 0432   CO2 24 11/07/2022 0432   GLUCOSE 114 (H) 11/07/2022 0432   BUN 14 11/07/2022 0432   BUN 17 10/27/2020 1038   CREATININE 0.80 11/07/2022 0432   CALCIUM 8.1 (L) 11/07/2022 0432   GFRNONAA >60 11/07/2022 0432   GFRAA >60 07/07/2019 0259    INR    Component Value Date/Time   INR 1.2 08/16/2022 0637     Intake/Output Summary (Last 24 hours) at 11/07/2022 1610 Last data filed at 11/07/2022 9604 Gross per 24 hour  Intake 900.53 ml  Output 1962 ml  Net -1061.47 ml      Assessment/Plan:  89 y.o. male is s/p:  Femoral-femoral bypass graft excision Left common femoral vein primary repair Redo femorofemoral bypass using cadaveric  greater saphenous vein Right common femoral to left profunda with interposition bypass to the superficial femoral artery  10/25/2022 And   1) direct repair of right common femoral artery pseudoaneurysm 2) incision and drainage of left groin seroma 3) left sartorius flap to left groin 4) right sartorius flap to right groin 10/31/2022   -bilateral feet are warm and well perfused.  + doppler signal in fem fem bypass -bilateral groins with vac with good seal.   -Pt remains at high risk for blow out from infection as the wound was grossly infected at initial graft excision.  -JP drain with 100cc/24hrs.  Will need to continue JP drain.  -getting TEE today.  Abx per ID as pt had +BC -DVT prophylaxis:  sq heparin/TED hose   Doreatha Massed, PA-C [facility] [phone] 11/07/2022 6:42 AM

## 2022-11-07 NOTE — Anesthesia Postprocedure Evaluation (Signed)
Anesthesia Post Note  Patient: Edward Campos  Procedure(s) Performed: TRANSESOPHAGEAL ECHOCARDIOGRAM     Patient location during evaluation: Cath Lab Anesthesia Type: MAC Level of consciousness: sedated Pain management: pain level controlled Vital Signs Assessment: post-procedure vital signs reviewed and stable Respiratory status: spontaneous breathing Cardiovascular status: stable Postop Assessment: no apparent nausea or vomiting Anesthetic complications: no  There were no known notable events for this encounter.  Last Vitals:  Vitals:   11/07/22 0408 11/07/22 1142  BP: 134/75 120/74  Pulse: 81 94  Resp: 16 17  Temp: 36.5 C 36.6 C  SpO2: 98% 98%    Last Pain:  Vitals:   11/07/22 1142  TempSrc: Oral  PainSc: 0-No pain                 Caren Macadam

## 2022-11-07 NOTE — CV Procedure (Signed)
    TRANSESOPHAGEAL ECHOCARDIOGRAM   NAME:  Edward Campos   MRN: [id number] DOB:  16-Feb-1933   ADMIT DATE: 10/22/2022  INDICATIONS: Bacteremia  PROCEDURE:   Informed consent was obtained prior to the procedure. The risks, benefits and alternatives for the procedure were discussed and the patient comprehended these risks.  Risks include, but are not limited to, cough, sore throat, vomiting, nausea, somnolence, esophageal and stomach trauma or perforation, bleeding, low blood pressure, aspiration, pneumonia, infection, trauma to the teeth and death.    Procedural time out performed.   Patient received monitored anesthesia care under the supervision of Dr. Raechel Ache. Patient received a total of 174 mg propofol during the procedure.  The transesophageal probe was inserted in the esophagus and stomach without difficulty and multiple views were obtained.    COMPLICATIONS:    There were no immediate complications.  FINDINGS:  LEFT VENTRICLE: EF = 60-65%. No regional wall motion abnormalities.  RIGHT VENTRICLE: Normal size and function.   LEFT ATRIUM: No thrombus/mass.  LEFT ATRIAL APPENDAGE: No thrombus/mass.   RIGHT ATRIUM: No thrombus/mass.  AORTIC VALVE:  Trileaflet. Trivial regurgitation. No vegetation.  MITRAL VALVE:    Thickened leaflets. Mild regurgitation. No vegetation.  TRICUSPID VALVE: Normal structure. Mild regurgitation. No vegetation.  PULMONIC VALVE: Grossly normal structure. Trivial regurgitation. No apparent vegetation.  INTERATRIAL SEPTUM: No PFO or ASD seen by color Doppler.  PERICARDIUM: No effusion noted.  DESCENDING AORTA: Moderate diffuse plaque seen   CONCLUSION: No evidence of endocarditis   Jodelle Red, MD, PhD, [facility]  [facility]  [place]  [facility] at [facility] [address] [place], [place] [address] [phone]   1:59 PM

## 2022-11-07 NOTE — Anesthesia Preprocedure Evaluation (Addendum)
Anesthesia Evaluation  Patient identified by MRN, date of birth, ID band Patient awake    Reviewed: Allergy & Precautions, NPO status , Patient's Chart, lab work & pertinent test results  Airway Mallampati: II       Dental  (+) Edentulous Upper   Pulmonary former smoker   Pulmonary exam normal        Cardiovascular + Peripheral Vascular Disease  Normal cardiovascular exam Rhythm:Regular Rate:Normal     Neuro/Psych   Anxiety     negative neurological ROS     GI/Hepatic negative GI ROS, Neg liver ROS,,,  Endo/Other  negative endocrine ROS    Renal/GU negative Renal ROS  negative genitourinary   Musculoskeletal  (+) Arthritis ,    Abdominal Normal abdominal exam  (+)   Peds  Hematology  (+) Blood dyscrasia, anemia   Anesthesia Other Findings   Reproductive/Obstetrics                             Anesthesia Physical Anesthesia Plan  ASA: 3  Anesthesia Plan: MAC   Post-op Pain Management: Tylenol PO (pre-op)*   Induction: Intravenous  PONV Risk Score and Plan: 3 and Treatment may vary due to age or medical condition and Propofol infusion  Airway Management Planned: Natural Airway and Mask  Additional Equipment: None  Intra-op Plan:   Post-operative Plan:   Informed Consent: I have reviewed the patients History and Physical, chart, labs and discussed the procedure including the risks, benefits and alternatives for the proposed anesthesia with the patient or authorized representative who has indicated his/her understanding and acceptance.     Dental advisory given  Plan Discussed with: CRNA  Anesthesia Plan Comments:        Anesthesia Quick Evaluation

## 2022-11-07 NOTE — Progress Notes (Signed)
Physical Therapy Treatment Patient Details Name: Edward Campos MRN: [id number] DOB: Aug 04, 1932 Today's Date: 11/07/2022   History of Present Illness Pt is a 89 y.o. male admitted to [facility] on 10/22/2022 with Infected right to left femoral-femoral Dacron bypass graft. On 10/25/22, pt underwent femoral bypass graft excision with Left common femoral vein primary repair, redo of femorofemoral bypass, Right common femoral to left profunda with interposition bypass to the superficial femoral artery, myriad Morcel placement, and Bilateral Prevena wound VAC placement.  Developed L groin seroma and on 8/21 pt underwent direct repair of R common femoral a. Pseudoaneurysm, I&D L groin seroma, L sartorius flap to L groin and R sartorius flap to R groin.  Recent hospitalizations in June for infected right to left femoral to femoral bypass graft and in July 2024 with a postobstructive pneumonia. PMH significant for HLD, PVD s/p bypass grafting, AAA, SCLC w/bony mets(diagnosed June 2024).    PT Comments  Patient mobilizing with A for safety in hallway, still with flexed posture and needing support on walker due to wound vac.  HR up to 121 during ambulation, but pt denied symptoms.  Reports for TEE this pm.  PT will continue to follow.      If plan is discharge home, recommend the following: A little help with walking and/or transfers;A little help with bathing/dressing/bathroom;Assistance with cooking/housework;Assist for transportation;Help with stairs or ramp for entrance   Can travel by private vehicle     Yes  Equipment Recommendations  Rolling walker (2 wheels)    Recommendations for Other Services       Precautions / Restrictions Precautions Precautions: Fall Precaution Comments: B groin wound vacs; L JP drain Restrictions Weight Bearing Restrictions: No     Mobility  Bed Mobility Overal bed mobility: Needs Assistance Bed Mobility: Supine to Sit, Sit to Supine     Supine to sit: Supervision Sit  to supine: Supervision   General bed mobility comments: assist for multiple line management    Transfers Overall transfer level: Needs assistance Equipment used: Rolling walker (2 wheels) Transfers: Sit to/from Stand Sit to Stand: Contact guard assist           General transfer comment: for balance and lines    Ambulation/Gait Ambulation/Gait assistance: Contact guard assist Gait Distance (Feet): 200 Feet (x 2; stopped to rest legs x 1) Assistive device: Rolling walker (2 wheels) Gait Pattern/deviations: Step-through pattern, Decreased stride length, Shuffle, Wide base of support       General Gait Details: assist for balance, (for walker due to wound vac); standing rest in hallway lifting legs and reporting leg fatigue   Stairs             Wheelchair Mobility     Tilt Bed    Modified Rankin (Stroke Patients Only)       Balance Overall balance assessment: Needs assistance   Sitting balance-Leahy Scale: Good     Standing balance support: Bilateral upper extremity supported, Reliant on assistive device for balance Standing balance-Leahy Scale: Poor                              Cognition Arousal: Alert Behavior During Therapy: WFL for tasks assessed/performed Overall Cognitive Status: Within Functional Limits for tasks assessed  Exercises      General Comments General comments (skin integrity, edema, etc.): HR up to 121 with ambulation, denies symptoms      Pertinent Vitals/Pain Pain Assessment Faces Pain Scale: Hurts a little bit Pain Location: B groin wounds Pain Descriptors / Indicators: Discomfort Pain Intervention(s): Monitored during session    Home Living                          Prior Function            PT Goals (current goals can now be found in the care plan section) Progress towards PT goals: Progressing toward goals    Frequency    Min  1X/week      PT Plan      Co-evaluation              AM-PAC PT "6 Clicks" Mobility   Outcome Measure  Help needed turning from your back to your side while in a flat bed without using bedrails?: A Little Help needed moving from lying on your back to sitting on the side of a flat bed without using bedrails?: A Little Help needed moving to and from a bed to a chair (including a wheelchair)?: A Little Help needed standing up from a chair using your arms (e.g., wheelchair or bedside chair)?: A Little Help needed to walk in hospital room?: A Little Help needed climbing 3-5 steps with a railing? : Total 6 Click Score: 16    End of Session Equipment Utilized During Treatment: Gait belt Activity Tolerance: Patient tolerated treatment well Patient left: in bed;with call bell/phone within reach   PT Visit Diagnosis: Difficulty in walking, not elsewhere classified (R26.2);Muscle weakness (generalized) (M62.81)     Time: 4098-1191 PT Time Calculation (min) (ACUTE ONLY): 21 min  Charges:    $Gait Training: 8-22 mins PT General Charges $$ ACUTE PT VISIT: 1 Visit                     Edward Campos, PT Acute Rehabilitation Services Office:[phone] 11/07/2022    Edward Campos 11/07/2022, 1:28 PM

## 2022-11-07 NOTE — Progress Notes (Signed)
Called into patient's room statin that his drain was leaking. Went to bedside, noticed that the JP drain had opened up and leaked in the bed. JP drain recharged, linen changed. Unsure amount of blood loss on the sheets.

## 2022-11-07 NOTE — Plan of Care (Signed)
ID brief note  8/28 TTE with no vegetations   OPAT  Diagnosis: MRSA bacteremia/endovascular infection  Culture Result: MRSA   Allergies  Allergen Reactions   Penicillins Other (See Comments)    OPAT Orders Discharge antibiotics to be given via PICC line Discharge antibiotics:Daptomycin  Per pharmacy protocol  Aim for Vancomycin trough 15-20 or AUC 400-550 (unless otherwise indicated) Duration: 6 weeks  End Date: 12/11/22  [facility] Care Per Protocol:  Home health RN for IV administration and teaching; PICC line care and labs.    Labs weekly while on IV antibiotics: _X_ CBC with differential _X_ BMP __ CMP _X_ CRP _X_ ESR __ Vancomycin trough X__ CK  __ Please pull PIC at completion of IV antibiotics X__ Please leave PIC in place until doctor has seen patient or been notified  Fax weekly labs to [phone]  Clinic Follow Up Appt: 9/17 at 3: 30 pm   ID will so for now, please recall if needed   Odette Fraction, MD Infectious Disease Physician [facility] [address] [place], [place] [address] Phone: [phone]  Fax: [phone]

## 2022-11-08 ENCOUNTER — Encounter (HOSPITAL_COMMUNITY): Payer: Self-pay | Admitting: Cardiology

## 2022-11-08 DIAGNOSIS — T827XXD Infection and inflammatory reaction due to other cardiac and vascular devices, implants and grafts, subsequent encounter: Secondary | ICD-10-CM | POA: Diagnosis not present

## 2022-11-08 DIAGNOSIS — R7881 Bacteremia: Secondary | ICD-10-CM | POA: Diagnosis not present

## 2022-11-08 DIAGNOSIS — I739 Peripheral vascular disease, unspecified: Secondary | ICD-10-CM | POA: Diagnosis not present

## 2022-11-08 DIAGNOSIS — Z7189 Other specified counseling: Secondary | ICD-10-CM | POA: Diagnosis not present

## 2022-11-08 DIAGNOSIS — R7401 Elevation of levels of liver transaminase levels: Secondary | ICD-10-CM | POA: Diagnosis not present

## 2022-11-08 DIAGNOSIS — E871 Hypo-osmolality and hyponatremia: Secondary | ICD-10-CM | POA: Diagnosis not present

## 2022-11-08 DIAGNOSIS — Z515 Encounter for palliative care: Secondary | ICD-10-CM | POA: Diagnosis not present

## 2022-11-08 DIAGNOSIS — C3432 Malignant neoplasm of lower lobe, left bronchus or lung: Secondary | ICD-10-CM | POA: Diagnosis not present

## 2022-11-08 LAB — CBC
HCT: 21.9 % — ABNORMAL LOW (ref 39.0–52.0)
Hemoglobin: 7.4 g/dL — ABNORMAL LOW (ref 13.0–17.0)
MCH: 29.8 pg (ref 26.0–34.0)
MCHC: 33.8 g/dL (ref 30.0–36.0)
MCV: 88.3 fL (ref 80.0–100.0)
Platelets: 338 10*3/uL (ref 150–400)
RBC: 2.48 MIL/uL — ABNORMAL LOW (ref 4.22–5.81)
RDW: 18 % — ABNORMAL HIGH (ref 11.5–15.5)
WBC: 8.1 10*3/uL (ref 4.0–10.5)
nRBC: 0 % (ref 0.0–0.2)

## 2022-11-08 LAB — BASIC METABOLIC PANEL
Anion gap: 8 (ref 5–15)
BUN: 16 mg/dL (ref 8–23)
CO2: 23 mmol/L (ref 22–32)
Calcium: 7.9 mg/dL — ABNORMAL LOW (ref 8.9–10.3)
Chloride: 96 mmol/L — ABNORMAL LOW (ref 98–111)
Creatinine, Ser: 0.9 mg/dL (ref 0.61–1.24)
GFR, Estimated: >60 mL/min (ref >60)
Glucose, Bld: 113 mg/dL — ABNORMAL HIGH (ref 70–99)
Potassium: 3.7 mmol/L (ref 3.5–5.1)
Sodium: 127 mmol/L — ABNORMAL LOW (ref 135–145)

## 2022-11-08 LAB — ECHO TEE

## 2022-11-08 NOTE — Progress Notes (Addendum)
Progress Note    11/08/2022 6:38 AM 1 Day Post-Op  Subjective:  trying to order breakfast; says his echo yesterday was good; denies any pain in his feet.   afebrile  Vitals:   11/07/22 2250 11/08/22 0346  BP: 122/72 125/81  Pulse:    Resp: 20 16  Temp: (!) 97.4 F (36.3 C) (!) 97.5 F (36.4 C)  SpO2: 95% 96%    Physical Exam: General:  no distress; on tele trying to order breakfast Cardiac:  regular Lungs:  non labored Incisions:  bilateral prevenas are in tact with good seal Extremities:  bilateral feet are warm.  +doppler flow in fem fem graft Abdomen:  soft  CBC    Component Value Date/Time   WBC 8.1 11/08/2022 0405   RBC 2.48 (L) 11/08/2022 0405   HGB 7.4 (L) 11/08/2022 0405   HGB 13.9 10/27/2020 1038   HCT 21.9 (L) 11/08/2022 0405   HCT 41.0 10/27/2020 1038   PLT 338 11/08/2022 0405   PLT 208 10/27/2020 1038   MCV 88.3 11/08/2022 0405   MCV 90 10/27/2020 1038   MCH 29.8 11/08/2022 0405   MCHC 33.8 11/08/2022 0405   RDW 18.0 (H) 11/08/2022 0405   RDW 12.7 10/27/2020 1038   LYMPHSABS 0.6 (L) 11/01/2022 0415   MONOABS 0.9 11/01/2022 0415   EOSABS 0.0 11/01/2022 0415   BASOSABS 0.0 11/01/2022 0415    BMET    Component Value Date/Time   NA 127 (L) 11/08/2022 0405   NA 138 10/27/2020 1038   K 3.7 11/08/2022 0405   CL 96 (L) 11/08/2022 0405   CO2 23 11/08/2022 0405   GLUCOSE 113 (H) 11/08/2022 0405   BUN 16 11/08/2022 0405   BUN 17 10/27/2020 1038   CREATININE 0.90 11/08/2022 0405   CALCIUM 7.9 (L) 11/08/2022 0405   GFRNONAA >60 11/08/2022 0405   GFRAA >60 07/07/2019 0259    INR    Component Value Date/Time   INR 1.2 08/16/2022 0637     Intake/Output Summary (Last 24 hours) at 11/08/2022 2440 Last data filed at 11/08/2022 0610 Gross per 24 hour  Intake 200 ml  Output 1555 ml  Net -1355 ml      Assessment/Plan:  89 y.o. male is s/p:  Femoral-femoral bypass graft excision Left common femoral vein primary repair Redo femorofemoral  bypass using cadaveric greater saphenous vein Right common femoral to left profunda with interposition bypass to the superficial femoral artery  10/25/2022 And   1) direct repair of right common femoral artery pseudoaneurysm 2) incision and drainage of left groin seroma 3) left sartorius flap to left groin 4) right sartorius flap to right groin 10/31/2022   -pt continues to have brisk doppler flow in fem fem graft -prevena bilaterally good seal -acute blood loss anemia with hgb of 7.4 this am down from 8.5 yesterday.  Pt does not have any signs of active bleeding and vital signs are stable.  Continue to monitor as pt is at high risk for blow out given wound was grossly infected at time of initial graft excision.  Abx per ID as he had +BC -JP drain output not recorded yesterday day shift.  The bulb opened and drained in bed last night.  I have asked day and night RN to make sure to record output and if less than 50cc in 24 hrs, JP can be removed.   -continue to mobilize out of bed. -TEE final report pending-per pt, he was told everything looks good.  Doreatha Massed, PA-C [facility] [phone] 11/08/2022 6:38 AM  VASCULAR STAFF ADDENDUM: I have independently interviewed and examined the patient. I agree with the above.  Possible JP removal tomorrow.   Victorino Sparrow MD [facility] Phone Number: [phone] 11/08/2022 4:37 PM

## 2022-11-08 NOTE — Progress Notes (Signed)
[facility]                                                                              Edward Campos, is a 89 y.o. male, DOB - 09/21/1932, WGN:[id number] Admit date - 10/22/2022    Outpatient Primary MD for the patient is Patient, No Pcp Per  LOS - 17  days  No chief complaint on file.      Brief summary   Patient is a 89 y.o. male with HLD, PVD s/p bypass grafting, AAA, SCLC hospitalization in July 2024 for infected right to left femoral to femoral bypass graft requiring temporary wound VAC placement who presented with concern of infected femoral bypass with fevers at home.  CT angio abdomen pelvis noted right bifemoral bypass graft with fluid/inflammatory changes associated with the femorofemoral bypass most prominent on the left compatible with a graft infection, and a 5.8 infrarenal abdominal aortic aneurysm grossly unchanged in size. He was started on broad-spectrum antibiotics. He underwent vascular surgery intervention with wound VAC placement on 10/25/2022. Blood cultures on 10/23/2022 grew MRSA. ID was consulted, on IV daptomycin.  Patient underwent direct repair of right common femoral artery pseudoaneurysm; I&D of left groin seroma, left sartorius flap to left groin and right sartorius flap to right groin by vascular surgery on 10/31/2022.    Assessment & Plan    Principal Problem:   Sepsis due to methicillin resistant Staphylococcus aureus (MRSA) (HCC) MRSA bacteremia, POA - Patient met criteria for sepsis on admission with tachycardia, leukocytosis, fevers of 101 Fahrenheit prior to admission.  -CT abdomen pelvis showed right bifemoral bypass graft with fluid/inflammatory changes associated with femoral-femoral bypass most prominent on the left compatible with graft infection. - blood cultures positive for MRSA.  - seen by vascular surgery, underwent femoral-femoral bypass graft excision, left common femoral vein primary repair, redo femoral-femoral bypass  using cadaveric great saphenous vein, right common femoral to left profunda with interposition bypass to the superficial femoral artery on 10/25/2022  -Noted to have significant purulence in left groin, underwent direct repair of right common femoral artery pseudoaneurysm, incision and drainage of left groin seroma, left sartorius flap to left groin, right sartorius flap to right groin on 10/31/2022 -Vascular surgery closely following, has bilateral groins vacs, JP drain on the left.  Still remains high risk for blowout from infection as wound was grossly infected at initial graft excision. -Wound cultures positive for MRSA.  -2D echo showed normal EF, no regional WMA, diastolic dysfunction, no vegetations noted -TEE 8/28: No evidence of endocarditis, EF 60 to 65%, no regional WMA. -ID following, continue IV daptomycin  Active problems  Acute on chronic hyponatremia -Baseline sodium 128-131. -On initial presentation sodium noted at 122, history of small cell lung CA -Urine osmolality 467, urine sodium 89, likely component of SIADH -Na 127 today -Continue salt tabs 1 g 3 times daily, will add fluid restriction 1800 cc.  If still trending down in a.m., increase salt tabs to 2 g 3 times daily   Hypokalemia/hypophosphatemia -Currently stable, continue supplementation as needed  Small cell lung cancer with bone metastases -History of stage IV lung cancer with disease  to the bone and prior postobstructive pneumonia. -CT scan showed branching mass like opacity measuring 5.2 x 7.9 cm in the left hilar region/central left lower lobe similar in size to prior. -Chemotherapy noted not to have been initiated due to wounds not being healed yet.  -Outpatient follow-up with primary oncologist, palliative following for goals of care     PAD -Continue aspirin, Plavix -Statin held due to transaminitis.  -Vascular surgery following.     Transaminitis -Improved -CT angiogram abdomen and pelvis with no  acute abnormalities noted in the left. -Acute hepatitis panel negative.  Crestor on hold     Hyperlipidemia -Crestor held secondary to rising LFTs.   -LFTs trending down.   -Likely resume Crestor on discharge.   AAA -Patient with 5.8 x 5.7 cm infrarenal abdominal aortic aneurysm unchanged in size. -Vascular surgery following.  Estimated body mass index is 22.24 kg/m as calculated from the following:   Height as of this encounter: 5\' 9"  (1.753 m).   Weight as of this encounter: 68.3 kg.  Code Status: Full code DVT Prophylaxis:  SCD's Start: 10/25/22 1427 heparin injection 5,000 Units Start: 10/22/22 1600 SCDs Start: 10/22/22 1501   Level of Care: Level of care: Progressive Family Communication: Updated patient Disposition Plan:      Remains inpatient appropriate: Per vascular surgery and ID   Procedures:  10/25/2022 Femoral-femoral bypass graft excision Left common femoral vein primary repair Redo femorofemoral bypass using cadaveric greater saphenous vein Right common femoral to left profunda with interposition bypass to the superficial femoral artery   10/31/2022 1) direct repair of right common femoral artery pseudoaneurysm 2) incision and drainage of left groin seroma 3) left sartorius flap to left groin 4) right sartorius flap to right groin  2D echo TEE 8/28: No evidence of endocarditis  Consultants:   Vascular surgery Infectious disease Palliative medicine  Antimicrobials:   Anti-infectives (From admission, onward)    Start     Dose/Rate Route Frequency Ordered Stop   10/26/22 0945  DAPTOmycin (CUBICIN) 600 mg in sodium chloride 0.9 % IVPB        8 mg/kg  75.9 kg 124 mL/hr over 30 Minutes Intravenous Daily 10/26/22 0855     10/26/22 0945  cefTRIAXone (ROCEPHIN) 2 g in sodium chloride 0.9 % 100 mL IVPB  Status:  Discontinued        2 g 200 mL/hr over 30 Minutes Intravenous Every 24 hours 10/26/22 0855 10/26/22 0905   10/25/22 1006  ceFAZolin 1 g /  gentamicin 80 mg in NS 500 mL surgical irrigation  Status:  Discontinued          As needed 10/25/22 1006 10/25/22 1318   10/23/22 1145  vancomycin (VANCOREADY) IVPB 1250 mg/250 mL  Status:  Discontinued        1,250 mg 166.7 mL/hr over 90 Minutes Intravenous Every 24 hours 10/23/22 1051 10/26/22 0855   10/23/22 1100  ceFEPIme (MAXIPIME) 2 g in sodium chloride 0.9 % 100 mL IVPB  Status:  Discontinued        2 g 200 mL/hr over 30 Minutes Intravenous Every 12 hours 10/23/22 1041 10/26/22 0855          Medications  sodium chloride   Intravenous Once   aspirin EC  81 mg Oral Daily   Chlorhexidine Gluconate Cloth  6 each Topical Daily   clopidogrel  75 mg Oral Daily   docusate sodium  100 mg Oral Daily   fluticasone  2 spray Each Nare  Daily   guaiFENesin  1,200 mg Oral BID   heparin  5,000 Units Subcutaneous Q8H   multivitamin with minerals  1 tablet Oral Daily   pantoprazole  40 mg Oral Daily   sodium chloride flush  10-40 mL Intracatheter Q12H   sodium chloride flush  3 mL Intravenous Q12H   sodium chloride  1 g Oral TID WC      Subjective:   Edward Campos was seen and examined today.  No acute complaints today.  Wound vacs in place.  No acute issues overnight.  No fevers or chills.   Patient denies dizziness, chest pain, shortness of breath, abdominal pain, N/V/.  Objective:   Vitals:   11/08/22 0346 11/08/22 0500 11/08/22 0750 11/08/22 1206  BP: 125/81  128/82 (!) 144/84  Pulse:      Resp: 16  17 20   Temp: (!) 97.5 F (36.4 C)  97.6 F (36.4 C) 97.7 F (36.5 C)  TempSrc: Oral  Oral Oral  SpO2: 96%     Weight:  68.3 kg    Height:        Intake/Output Summary (Last 24 hours) at 11/08/2022 1321 Last data filed at 11/08/2022 0610 Gross per 24 hour  Intake 200 ml  Output 1155 ml  Net -955 ml     Wt Readings from Last 3 Encounters:  11/08/22 68.3 kg  10/22/22 73.7 kg  10/03/22 74.4 kg    Physical Exam General: Alert and oriented x 3, NAD Cardiovascular:  S1 S2 clear, RRR.  Respiratory: CTAB, no wheezing, rales or rhonchi Gastrointestinal: Soft, nontender, nondistended, NBS Ext: no pedal edema bilaterally Neuro: no new deficits Skin: b/l groin wound vacs, +left drain Psych: Normal affect    Data Reviewed:  I have personally reviewed following labs    CBC Lab Results  Component Value Date   WBC 8.1 11/08/2022   RBC 2.48 (L) 11/08/2022   HGB 7.4 (L) 11/08/2022   HCT 21.9 (L) 11/08/2022   MCV 88.3 11/08/2022   MCH 29.8 11/08/2022   PLT 338 11/08/2022   MCHC 33.8 11/08/2022   RDW 18.0 (H) 11/08/2022   LYMPHSABS 0.6 (L) 11/01/2022   MONOABS 0.9 11/01/2022   EOSABS 0.0 11/01/2022   BASOSABS 0.0 11/01/2022     Last metabolic panel Lab Results  Component Value Date   NA 127 (L) 11/08/2022   K 3.7 11/08/2022   CL 96 (L) 11/08/2022   CO2 23 11/08/2022   BUN 16 11/08/2022   CREATININE 0.90 11/08/2022   GLUCOSE 113 (H) 11/08/2022   GFRNONAA >60 11/08/2022   GFRAA >60 07/07/2019   CALCIUM 7.9 (L) 11/08/2022   PHOS 2.3 (L) 10/31/2022   PROT 5.3 (L) 10/31/2022   ALBUMIN 2.2 (L) 10/31/2022   BILITOT 1.1 10/31/2022   ALKPHOS 66 10/31/2022   AST 29 10/31/2022   ALT 43 10/31/2022   ANIONGAP 8 11/08/2022    CBG (last 3)  No results for input(s): "GLUCAP" in the last 72 hours.    Coagulation Profile: No results for input(s): "INR", "PROTIME" in the last 168 hours.   Radiology Studies: I have personally reviewed the imaging studies  EP STUDY  Result Date: 11/07/2022 See surgical note for result.      Thad Ranger M.D. [facility] 11/08/2022, 1:21 PM  Available via Epic secure chat 7am-7pm After 7 pm, please refer to night coverage provider listed on amion.

## 2022-11-08 NOTE — Progress Notes (Signed)
Patient ID: Edward Campos, male   DOB: 06-12-1932, 89 y.o.   MRN: [id number]    Progress Note from the Palliative Medicine Team at [facility]   Patient Name: Edward Campos        Date: 11/08/2022 DOB: 09/03/1932  Age: 89 y.o. MRN#: [id number] Attending Physician: Cathren Harsh, MD Primary Care Physician: Patient, No Pcp Per Admit Date: 10/22/2022    Extensive chart review has been completed prior to meeting with patient/family  including labs, vital signs, imaging, progress/consult notes, orders, medications and available advance directive documents.   89 y.o. male   admitted on 10/22/2022 with history of a right to left femorofemoral bypass with common and external iliac artery stenting on the right with a known abdominal aortic aneurysm.     Complex vascular history  06/08/2019: Originally Dr. Darrick Penna performed Right common femoral external iliac endarterectomy, right common (9 x 39 VBX) and external iliac stent 8 x 59 VBX, 8 x 40 Inova, right to left femoral-femoral bypass, left external iliac common femoral endarterectomy, patch angioplasty right common femoral artery Dacron for severe claudication symptoms.     07/06/19: Incision and drainage left groin placement of VAC dressing for Clear fluid seroma under pressure proximally 20 cc.     08/16/22:Dr. Randie Heinz performed I & D infected graft.  A wound vac was placed post op and he received 2 weeks of antibiotics.     08/22/22:  Dr. Chestine Spore performed Procedure: 1.  Irrigation and debridement with VAC change of wound overlying femoral-femoral bypass graft 2.  Application of 38 cm skin substitute using Kerecis to wound overlying femoral-femoral bypass (S2831)   Patient  recently  diagnosed with small cell lung cancer/Dr Elbert Ewings at [facility].      Patient was admitted 2/2 to worsening infection of his Right to Left femorofemoral bypass graft.   10/25/22 Vascular surgical repair  Patient is progressing well.     This NP  assessed patient at the bedside as a follow up for palliative medicine needs and emotional support.   Patient tells me he is "feeling pretty good".  He is alert and oriented and verbalizes optimism .          Ultimately he is hopeful for improved quality of life.  Plan is for SNF for ongoing rehabilitation, his first choice is [facility]  Education offered on the importance of revisiting his health care wishes, advanced care planning, moving along his life trajectory.  We again discussed consideration of code status.   Education offered today regarding  the importance of continued conversation with family and their  medical providers regarding overall plan of care and treatment options,  ensuring decisions are within the context of the patients values and GOCs.  Questions and concerns addressed      Time: 35 minutes  Detailed review of medical records ( labs, imaging, vital signs), medically appropriate exam ( MS, skin, resp)   discussed with treatment team, counseling and education to patient, family, staff, documenting clinical information, medication management, coordination of care    Lorinda Creed NP  Palliative Medicine Team Team Phone # [phone] Pager [phone]

## 2022-11-08 NOTE — Plan of Care (Signed)
?  Problem: Clinical Measurements: ?Goal: Will remain free from infection ?Outcome: Progressing ?  ?

## 2022-11-08 NOTE — Progress Notes (Signed)
Mobility Specialist Progress Note:   11/08/22 1209  Mobility  Activity Ambulated with assistance in hallway  Level of Assistance Contact guard assist, steadying assist  Assistive Device Front wheel walker  Distance Ambulated (ft) 470 ft  Activity Response Tolerated well  Mobility Referral Yes  $Mobility charge 1 Mobility  Mobility Specialist Start Time (ACUTE ONLY) 1140  Mobility Specialist Stop Time (ACUTE ONLY) 1205  Mobility Specialist Time Calculation (min) (ACUTE ONLY) 25 min   Pre Mobility: 88 HR  During Mobility: 125 HR Post Mobility: 91 HR   Pt received in bed, agreeable to mobility. Standing rest break required d/t leg stiffness, otherwise asymptomatic throughout. Pt returned to bed with RN and NT present in room.   Leory Plowman  Mobility Specialist Please contact via [employer] office at [phone]

## 2022-11-08 NOTE — Progress Notes (Signed)
Occupational Therapy Treatment Patient Details Name: Edward Campos MRN: [id number] DOB: 07-05-1932 Today's Date: 11/08/2022   History of present illness Pt is a 89 y.o. male admitted to [facility] on 10/22/2022 with Infected right to left femoral-femoral Dacron bypass graft. On 10/25/22, pt underwent femoral bypass graft excision with Left common femoral vein primary repair, redo of femorofemoral bypass, Right common femoral to left profunda with interposition bypass to the superficial femoral artery, myriad Morcel placement, and Bilateral Prevena wound VAC placement.  Developed L groin seroma and on 8/21 pt underwent direct repair of R common femoral a. Pseudoaneurysm, I&D L groin seroma, L sartorius flap to L groin and R sartorius flap to R groin.  Recent hospitalizations in June for infected right to left femoral to femoral bypass graft and in July 2024 with a postobstructive pneumonia. PMH significant for HLD, PVD s/p bypass grafting, AAA, SCLC w/bony mets(diagnosed June 2024).   OT comments  OT instructed pt in B UE therapeutic exercises to increase activity tolerance for carryover to ADLs and functional transfers/mobility. Pt with VSS on RA throughout session and is making progress toward OT goals. Pt declined addressing ADLs this session as he had completed them earlier in the day with NT assist. OT plans to address and reassess pt progress with ADLs next session. Pt will benefit from continued acute skilled OT services to address deficits outlined below and increase safety and independence with ADLs and functional transfers/mobility. Post acute discharge, pt will benefit from intensive inpatient skilled rehab services < 3 hours per day to maximize rehab potential.       If plan is discharge home, recommend the following:  A little help with walking and/or transfers;Assistance with cooking/housework;A lot of help with bathing/dressing/bathroom;Assist for transportation;Help with stairs or ramp for  entrance   Equipment Recommendations  Other (comment) (defer to next level of care)    Recommendations for Other Services      Precautions / Restrictions Precautions Precautions: Fall Restrictions Weight Bearing Restrictions: No       Mobility Bed Mobility Overal bed mobility: Needs Assistance Bed Mobility: Supine to Sit, Sit to Supine     Supine to sit: Supervision Sit to supine: Supervision   General bed mobility comments: assist for line management    Transfers                         Balance Overall balance assessment: Needs assistance Sitting-balance support: Single extremity supported, No upper extremity supported, Feet supported Sitting balance-Leahy Scale: Good                                     ADL either performed or assessed with clinical judgement   ADL Overall ADL's : Needs assistance/impaired                                       General ADL Comments: Pt declined participation in ADLs this session. OT plans to address next session.    Extremity/Trunk Assessment Upper Extremity Assessment Upper Extremity Assessment: Right hand dominant;Overall [facility] for tasks assessed   Lower Extremity Assessment Lower Extremity Assessment: Defer to PT evaluation        Vision       Perception     Praxis      Cognition Arousal: Alert Behavior  During Therapy: WFL for tasks assessed/performed Overall Cognitive Status: Within Functional Limits for tasks assessed                                 General Comments: Pt AAOx4, able to follow multi-step commands consistantly, and pleasant throughout session.        Exercises Exercises: General Upper Extremity, Other exercises General Exercises - Upper Extremity Shoulder Flexion: AROM, Both, 20 reps, Supine, Other (comment) (Increased activery tolerance; with HOB elevated) Shoulder Extension: AROM, Both, 20 reps, Supine, Other (comment) (Increased  activery tolerance; with HOB elevated) Shoulder ABduction: AROM, Both, 20 reps, Supine, Other (comment) (Increased activery tolerance; with HOB elevated) Shoulder ADduction: AROM, Both, 20 reps, Supine, Other (comment) (Increased activery tolerance; with HOB elevated) Shoulder Horizontal ABduction: AROM, Both, 20 reps, Supine, Other (comment) (Increased activery tolerance; with HOB elevated) Shoulder Horizontal ADduction: AROM, Both, 20 reps, Supine, Other (comment) (Increased activery tolerance; with HOB elevated) Elbow Flexion: AROM, Both, 20 reps, Supine, Other (comment) (Increased activery tolerance; with HOB elevated) Elbow Extension: AROM, Both, 20 reps, Supine, Other (comment) (Increased activery tolerance; with HOB elevated) Other Exercises Other Exercises: B UE AROM forearm supination/pronation; 20 reps, supine with HOB elevated: increased activity tolerance    Shoulder Instructions       General Comments VSS on RA throughout session. Pt's daughter present at beginning of session. Pt initally seated EOB for B UE therapeutic exercises but returned to supine with HOB elevated secondary to pt reporting pain in back with exercises with back unsupported and pt declining to transfer to chair.    Pertinent Vitals/ Pain       Pain Assessment Pain Assessment: Faces Faces Pain Scale: Hurts little more Pain Location: back Pain Descriptors / Indicators: Discomfort, Grimacing Pain Intervention(s): Limited activity within patient's tolerance, Monitored during session, Repositioned  Home Living                                          Prior Functioning/Environment              Frequency  Min 1X/week        Progress Toward Goals  OT Goals(current goals can now be found in the care plan section)  Progress towards OT goals: Progressing toward goals  Acute Rehab OT Goals Patient Stated Goal: For wounds to heal and to be able to return home  Plan       Co-evaluation                 AM-PAC OT "6 Clicks" Daily Activity     Outcome Measure   Help from another person eating meals?: None Help from another person taking care of personal grooming?: A Little Help from another person toileting, which includes using toliet, bedpan, or urinal?: A Little Help from another person bathing (including washing, rinsing, drying)?: A Lot Help from another person to put on and taking off regular upper body clothing?: A Little Help from another person to put on and taking off regular lower body clothing?: A Little 6 Click Score: 18    End of Session    OT Visit Diagnosis: Other (comment) (Decreased activity tolerance)   Activity Tolerance Patient tolerated treatment well   Patient Left in bed;with call bell/phone within reach;with bed alarm set   Nurse Communication Mobility status  Time: 0454-0981 OT Time Calculation (min): 27 min  Charges: OT General Charges $OT Visit: 1 Visit OT Treatments $Therapeutic Exercise: 23-37 mins  Serge Main "Orson Eva., OTR/L, MA Acute Rehab [phone]   Lendon Colonel 11/08/2022, 5:51 PM

## 2022-11-08 NOTE — TOC Progression Note (Signed)
Transition of Care [facility]) - Progression Note    Patient Details  Name: Edward Campos MRN: [id number] Date of Birth: 1932-12-01  Transition of Care [facility]) CM/SW Contact  [name], [place] Phone Number: 11/08/2022, 1:49 PM  Clinical Narrative:     Called patient's daughter, informed, [facility] has made offer, she accepted placement offer.   TOC following for medical readiness- have placement offer at [facility].   Antony Blackbird, MSW, LCSW Clinical Social Worker        Expected Discharge Plan: Skilled Nursing Facility Barriers to Discharge: Continued Medical Work up  Expected Discharge Plan and Services   Discharge Planning Services: CM Consult Post Acute Care Choice: Home Health, Resumption of Svcs/PTA Provider, Skilled Nursing Facility Living arrangements for the past 2 months: Single Family Home                           HH Arranged: PT, OT, Nurse's Aide, RN, Social Work [employer] Agency: [facility] ([facility]) Date HH Agency Contacted: 10/26/22 Time HH Agency Contacted: 1458 Representative spoke with at [facility] Agency: Morrie Sheldon   Social Determinants of Health (SDOH) Interventions SDOH Screenings   Food Insecurity: No Food Insecurity (10/25/2022)  Housing: Low Risk  (10/25/2022)  Transportation Needs: No Transportation Needs (10/25/2022)  Utilities: Not At Risk (10/25/2022)  Tobacco Use: Medium Risk (11/07/2022)    Readmission Risk Interventions     No data to display

## 2022-11-09 DIAGNOSIS — R6520 Severe sepsis without septic shock: Secondary | ICD-10-CM | POA: Diagnosis not present

## 2022-11-09 DIAGNOSIS — T827XXD Infection and inflammatory reaction due to other cardiac and vascular devices, implants and grafts, subsequent encounter: Secondary | ICD-10-CM | POA: Diagnosis not present

## 2022-11-09 DIAGNOSIS — A4102 Sepsis due to Methicillin resistant Staphylococcus aureus: Secondary | ICD-10-CM | POA: Diagnosis not present

## 2022-11-09 DIAGNOSIS — T827XXA Infection and inflammatory reaction due to other cardiac and vascular devices, implants and grafts, initial encounter: Secondary | ICD-10-CM | POA: Diagnosis not present

## 2022-11-09 LAB — BASIC METABOLIC PANEL
Anion gap: 7 (ref 5–15)
BUN: 15 mg/dL (ref 8–23)
CO2: 24 mmol/L (ref 22–32)
Calcium: 7.7 mg/dL — ABNORMAL LOW (ref 8.9–10.3)
Chloride: 97 mmol/L — ABNORMAL LOW (ref 98–111)
Creatinine, Ser: 0.81 mg/dL (ref 0.61–1.24)
GFR, Estimated: >60 mL/min (ref >60)
Glucose, Bld: 124 mg/dL — ABNORMAL HIGH (ref 70–99)
Potassium: 3.6 mmol/L (ref 3.5–5.1)
Sodium: 128 mmol/L — ABNORMAL LOW (ref 135–145)

## 2022-11-09 LAB — CBC
HCT: 22.2 % — ABNORMAL LOW (ref 39.0–52.0)
Hemoglobin: 7.3 g/dL — ABNORMAL LOW (ref 13.0–17.0)
MCH: 29.2 pg (ref 26.0–34.0)
MCHC: 32.9 g/dL (ref 30.0–36.0)
MCV: 88.8 fL (ref 80.0–100.0)
Platelets: 326 10*3/uL (ref 150–400)
RBC: 2.5 MIL/uL — ABNORMAL LOW (ref 4.22–5.81)
RDW: 17.8 % — ABNORMAL HIGH (ref 11.5–15.5)
WBC: 6.8 10*3/uL (ref 4.0–10.5)
nRBC: 0 % (ref 0.0–0.2)

## 2022-11-09 LAB — CK: Total CK: 52 U/L (ref 49–397)

## 2022-11-09 NOTE — Progress Notes (Signed)
Mobility Specialist Progress Note:   11/09/22 1100  Mobility  Activity Ambulated with assistance in hallway  Level of Assistance Contact guard assist, steadying assist  Assistive Device Front wheel walker  Distance Ambulated (ft) 940 ft  Activity Response Tolerated well  Mobility Referral Yes  $Mobility charge 1 Mobility  Mobility Specialist Start Time (ACUTE ONLY) 1113  Mobility Specialist Stop Time (ACUTE ONLY) 1134  Mobility Specialist Time Calculation (min) (ACUTE ONLY) 21 min    Pre Mobility: 93 HR During Mobility: 122 HR Post Mobility:  96 HR  Pt received in bed, agreeable to mobility. C/o some back pain and BLE stiffness. Took x1 standing rest break d/t BLE stiffness. Pt left in bed with call bell and bed alarm on.  D'Vante Earlene Plater Mobility Specialist Please contact via Special educational needs teacher or Rehab office at [phone]

## 2022-11-09 NOTE — Progress Notes (Signed)
PT Cancellation Note  Patient Details Name: Edward Campos MRN: [id number] DOB: 02/17/1933   Cancelled Treatment:    Reason Eval/Treat Not Completed: Fatigue/lethargy limiting ability to participate; reports walked earlier with mobility and sat up then just now back to bed.  Will attempt another day.   [name] 11/09/2022, 5:14 PM Sheran Lawless, PT Acute Rehabilitation Services Office:[phone] 11/09/2022

## 2022-11-09 NOTE — Progress Notes (Signed)
[facility]                                                                              Edward Campos, is a 89 y.o. male, DOB - [birth year], UVO:[id number] Admit date - 10/22/2022    Outpatient Primary MD for the patient is Patient, No Pcp Per  LOS - 18  days  No chief complaint on file.      Brief summary   Patient is a 89 y.o. male with HLD, PVD s/p bypass grafting, AAA, SCLC hospitalization in July 2024 for infected right to left femoral to femoral bypass graft requiring temporary wound VAC placement who presented with concern of infected femoral bypass with fevers at home.  CT angio abdomen pelvis noted right bifemoral bypass graft with fluid/inflammatory changes associated with the femorofemoral bypass most prominent on the left compatible with a graft infection, and a 5.8 infrarenal abdominal aortic aneurysm grossly unchanged in size. He was started on broad-spectrum antibiotics. He underwent vascular surgery intervention with wound VAC placement on 10/25/2022. Blood cultures on 10/23/2022 grew MRSA. ID was consulted, on IV daptomycin.  Patient underwent direct repair of right common femoral artery pseudoaneurysm; I&D of left groin seroma, left sartorius flap to left groin and right sartorius flap to right groin by vascular surgery on 10/31/2022.    Assessment & Plan    Principal Problem:   Sepsis due to methicillin resistant Staphylococcus aureus (MRSA) (HCC) MRSA bacteremia, POA - Patient met criteria for sepsis on admission with tachycardia, leukocytosis, fevers of 101 Fahrenheit prior to admission.  -CT abdomen pelvis showed right bifemoral bypass graft with fluid/inflammatory changes associated with femoral-femoral bypass most prominent on the left compatible with graft infection. - blood cultures positive for MRSA.  - seen by vascular surgery, underwent femoral-femoral bypass graft excision, left common femoral vein primary repair, redo femoral-femoral bypass  using cadaveric great saphenous vein, right common femoral to left profunda with interposition bypass to the superficial femoral artery on 10/25/2022  -Noted to have significant purulence in left groin, underwent direct repair of right common femoral artery pseudoaneurysm, incision and drainage of left groin seroma, left sartorius flap to left groin, right sartorius flap to right groin on 10/31/2022 -Vascular surgery closely following, bilateral groins vacs, JP drain on the left was placed    -Wound cultures positive for MRSA.  -2D echo showed normal EF, no regional WMA, diastolic dysfunction, no vegetations noted -TEE 8/28: No evidence of endocarditis, EF 60 to 65%, no regional WMA. -ID following, continue IV daptomycin -Overnight, leaking from patient's wound VAC, bilateral vacs removed this morning.  Still has JP drain  Active problems Normocytic anemia, acute on chronic, likely due to ABLA -Baseline hemoglobin 11-12 -Slowly trending down, hemoglobin 7.3 this morning -Follow H&H, transfuse for hemoglobin less than 7   Acute on chronic hyponatremia -Baseline sodium 128-131. -On initial presentation sodium noted at 122, history of small cell lung CA -Urine osmolality 467, urine sodium 89, likely component of SIADH -Continue salt tabs 1 g 3 times daily,  fluid restriction 1800 cc.  - Na improving to 128, close to his baseline, asymptomatic   Hypokalemia/hypophosphatemia -Currently  stable, continue supplementation as needed  Small cell lung cancer with bone metastases -History of stage IV lung cancer with disease to the bone and prior postobstructive pneumonia. -CT scan showed branching mass like opacity measuring 5.2 x 7.9 cm in the left hilar region/central left lower lobe similar in size to prior. -Chemotherapy noted not to have been initiated due to wounds not being healed yet.  -Outpatient follow-up with primary oncologist, palliative following for goals of care     PAD -Continue  aspirin, Plavix -Statin held due to transaminitis.  -Vascular surgery following.     Transaminitis -Improved -CT angiogram abdomen and pelvis with no acute abnormalities noted in the left. -Acute hepatitis panel negative.  Crestor on hold     Hyperlipidemia -Crestor held secondary to rising LFTs.   -LFTs trending down.   -Likely resume Crestor on discharge.   AAA -Patient with 5.8 x 5.7 cm infrarenal abdominal aortic aneurysm unchanged in size. -Vascular surgery following.  Estimated body mass index is 22.24 kg/m as calculated from the following:   Height as of this encounter: 5\' 9"  (1.753 m).   Weight as of this encounter: 68.3 kg.  Code Status: Full code DVT Prophylaxis:  SCD's Start: 10/25/22 1427 heparin injection 5,000 Units Start: 10/22/22 1600 SCDs Start: 10/22/22 1501   Level of Care: Level of care: Progressive Family Communication: Updated patient Disposition Plan:      Remains inpatient appropriate: Per vascular surgery and ID   Procedures:  10/25/2022 Femoral-femoral bypass graft excision Left common femoral vein primary repair Redo femorofemoral bypass using cadaveric greater saphenous vein Right common femoral to left profunda with interposition bypass to the superficial femoral artery   10/31/2022 1) direct repair of right common femoral artery pseudoaneurysm 2) incision and drainage of left groin seroma 3) left sartorius flap to left groin 4) right sartorius flap to right groin  2D echo TEE 8/28: No evidence of endocarditis  Consultants:   Vascular surgery Infectious disease Palliative medicine  Antimicrobials:   Anti-infectives (From admission, onward)    Start     Dose/Rate Route Frequency Ordered Stop   10/26/22 0945  DAPTOmycin (CUBICIN) 600 mg in sodium chloride 0.9 % IVPB        8 mg/kg  75.9 kg 124 mL/hr over 30 Minutes Intravenous Daily 10/26/22 0855     10/26/22 0945  cefTRIAXone (ROCEPHIN) 2 g in sodium chloride 0.9 % 100 mL IVPB   Status:  Discontinued        2 g 200 mL/hr over 30 Minutes Intravenous Every 24 hours 10/26/22 0855 10/26/22 0905   10/25/22 1006  ceFAZolin 1 g / gentamicin 80 mg in NS 500 mL surgical irrigation  Status:  Discontinued          As needed 10/25/22 1006 10/25/22 1318   10/23/22 1145  vancomycin (VANCOREADY) IVPB 1250 mg/250 mL  Status:  Discontinued        1,250 mg 166.7 mL/hr over 90 Minutes Intravenous Every 24 hours 10/23/22 1051 10/26/22 0855   10/23/22 1100  ceFEPIme (MAXIPIME) 2 g in sodium chloride 0.9 % 100 mL IVPB  Status:  Discontinued        2 g 200 mL/hr over 30 Minutes Intravenous Every 12 hours 10/23/22 1041 10/26/22 0855          Medications  sodium chloride   Intravenous Once   aspirin EC  81 mg Oral Daily   Chlorhexidine Gluconate Cloth  6 each Topical Daily   clopidogrel  75 mg Oral Daily   docusate sodium  100 mg Oral Daily   fluticasone  2 spray Each Nare Daily   guaiFENesin  1,200 mg Oral BID   heparin  5,000 Units Subcutaneous Q8H   multivitamin with minerals  1 tablet Oral Daily   pantoprazole  40 mg Oral Daily   sodium chloride flush  10-40 mL Intracatheter Q12H   sodium chloride flush  3 mL Intravenous Q12H   sodium chloride  1 g Oral TID WC      Subjective:   Edward Campos was seen and examined today.  Overnight issues noted, bilateral wound vacs removed today.  No acute pain, nausea vomiting or shortness of breath.    Objective:   Vitals:   11/08/22 1922 11/08/22 2310 11/09/22 0245 11/09/22 0802  BP: 134/88 126/83 130/76 102/73  Pulse:   82   Resp: 12 20 16 19   Temp: 98.1 F (36.7 C) 98.1 F (36.7 C) 97.8 F (36.6 C) 98.1 F (36.7 C)  TempSrc: Oral Oral Oral Oral  SpO2: 96% 95% 93%   Weight:      Height:        Intake/Output Summary (Last 24 hours) at 11/09/2022 1333 Last data filed at 11/09/2022 0817 Gross per 24 hour  Intake 120 ml  Output 1620 ml  Net -1500 ml     Wt Readings from Last 3 Encounters:  11/08/22 68.3 kg   10/22/22 73.7 kg  10/03/22 74.4 kg   Physical Exam General: Alert and oriented x 3, NAD Cardiovascular: S1 S2 clear, RRR.  Respiratory: CTAB, no wheezing Gastrointestinal: Soft, nontender, nondistended, NBS Ext: no pedal edema bilaterally Neuro: no new deficits Skin: Wound vacs removed, staples intact, + left drain Psych: Normal affect   Data Reviewed:  I have personally reviewed following labs    CBC Lab Results  Component Value Date   WBC 6.8 11/09/2022   RBC 2.50 (L) 11/09/2022   HGB 7.3 (L) 11/09/2022   HCT 22.2 (L) 11/09/2022   MCV 88.8 11/09/2022   MCH 29.2 11/09/2022   PLT 326 11/09/2022   MCHC 32.9 11/09/2022   RDW 17.8 (H) 11/09/2022   LYMPHSABS 0.6 (L) 11/01/2022   MONOABS 0.9 11/01/2022   EOSABS 0.0 11/01/2022   BASOSABS 0.0 11/01/2022     Last metabolic panel Lab Results  Component Value Date   NA 128 (L) 11/09/2022   K 3.6 11/09/2022   CL 97 (L) 11/09/2022   CO2 24 11/09/2022   BUN 15 11/09/2022   CREATININE 0.81 11/09/2022   GLUCOSE 124 (H) 11/09/2022   GFRNONAA >60 11/09/2022   GFRAA >60 07/07/2019   CALCIUM 7.7 (L) 11/09/2022   PHOS 2.3 (L) 10/31/2022   PROT 5.3 (L) 10/31/2022   ALBUMIN 2.2 (L) 10/31/2022   BILITOT 1.1 10/31/2022   ALKPHOS 66 10/31/2022   AST 29 10/31/2022   ALT 43 10/31/2022   ANIONGAP 7 11/09/2022    CBG (last 3)  No results for input(s): "GLUCAP" in the last 72 hours.    Coagulation Profile: No results for input(s): "INR", "PROTIME" in the last 168 hours.   Radiology Studies: I have personally reviewed the imaging studies  ECHO TEE  Result Date: 11/08/2022    TRANSESOPHOGEAL ECHO REPORT   Patient Name:   Edward Campos Date of Exam: 11/07/2022 Medical Rec #:  [id number]      Height:       69.0 in Accession #:    [id number]     Weight:  163.8 lb Date of Birth:  11-May-1932       BSA:          1.898 m Patient Age:    89 years       BP:           120/74 mmHg Patient Gender: M              HR:           89 bpm.  Exam Location:  Inpatient Procedure: Transesophageal Echo, Color Doppler and Cardiac Doppler Indications:     Bacteremia  History:         Patient has prior history of Echocardiogram examinations, most                  recent 10/27/2022. PAD; Risk Factors:Dyslipidemia.  Sonographer:     Delcie Roch RDCS Referring Phys:  [id number] RHONDA G BARRETT Diagnosing Phys: Jodelle Red MD PROCEDURE: After discussion of the risks and benefits of a TEE, an informed consent was obtained from the patient. The transesophogeal probe was passed without difficulty through the esophogus of the patient. Imaged were obtained with the patient in a left lateral decubitus position. Sedation performed by different physician. The patient was monitored while under deep sedation. Anesthestetic sedation was provided intravenously by Anesthesiology: 174mg  of Propofol, 60mg  of Lidocaine. The patient's vital signs; including heart rate, blood pressure, and oxygen saturation; remained stable throughout the procedure. The patient developed no complications during the procedure.  IMPRESSIONS  1. Left ventricular ejection fraction, by estimation, is 60 to 65%. The left ventricle has normal function. The left ventricle has no regional wall motion abnormalities.  2. Right ventricular systolic function is normal. The right ventricular size is normal.  3. No left atrial/left atrial appendage thrombus was detected.  4. Restricted motion of posterior leaflet. The mitral valve is normal in structure. Mild mitral valve regurgitation. No evidence of mitral stenosis.  5. The aortic valve is tricuspid. There is mild calcification of the aortic valve. Aortic valve regurgitation is trivial. No aortic stenosis is present.  6. There is Moderate (Grade III) plaque involving the descending aorta. Conclusion(s)/Recommendation(s): No evidence of vegetation/infective endocarditis on this transesophageael echocardiogram. FINDINGS  Left Ventricle: Left ventricular  ejection fraction, by estimation, is 60 to 65%. The left ventricle has normal function. The left ventricle has no regional wall motion abnormalities. The left ventricular internal cavity size was normal in size. Right Ventricle: The right ventricular size is normal. No increase in right ventricular wall thickness. Right ventricular systolic function is normal. Left Atrium: Left atrial size was normal in size. No left atrial/left atrial appendage thrombus was detected. Right Atrium: Right atrial size was normal in size. Pericardium: There is no evidence of pericardial effusion. Mitral Valve: Restricted motion of posterior leaflet. The mitral valve is normal in structure. Mild mitral valve regurgitation. No evidence of mitral valve stenosis. There is no evidence of mitral valve vegetation. Tricuspid Valve: The tricuspid valve is normal in structure. Tricuspid valve regurgitation is mild . No evidence of tricuspid stenosis. There is no evidence of tricuspid valve vegetation. Aortic Valve: The aortic valve is tricuspid. There is mild calcification of the aortic valve. Aortic valve regurgitation is trivial. No aortic stenosis is present. There is no evidence of aortic valve vegetation. Pulmonic Valve: The pulmonic valve was grossly normal. Pulmonic valve regurgitation is trivial. No evidence of pulmonic stenosis. There is no evidence of pulmonic valve vegetation. Aorta: The aortic root and ascending aorta are  structurally normal, with no evidence of dilitation. There is moderate (Grade III) plaque involving the descending aorta. IAS/Shunts: No atrial level shunt detected by color flow Doppler. Additional Comments: Spectral Doppler performed. TRICUSPID VALVE TR Peak grad:   27.0 mmHg TR Vmax:        260.00 cm/s Jodelle Red MD Electronically signed by Jodelle Red MD Signature Date/Time: 11/08/2022/1:33:08 PM    Final    EP STUDY  Result Date: 11/07/2022 See surgical note for result.       Thad Ranger M.D. [facility] 11/09/2022, 1:33 PM  Available via Epic secure chat 7am-7pm After 7 pm, please refer to night coverage provider listed on amion.

## 2022-11-09 NOTE — Progress Notes (Addendum)
Progress Note    11/09/2022 6:32 AM 2 Days Post-Op  Subjective:  pt sleeping and wakes easily.  Denies pain in his feet.   afebrile  Vitals:   11/08/22 2310 11/09/22 0245  BP: 126/83 130/76  Pulse:  82  Resp: 20 16  Temp: 98.1 F (36.7 C) 97.8 F (36.6 C)  SpO2: 95% 93%    Physical Exam: General:  no distress Cardiac:  regular Lungs:  non labored Incisions:  left groin in tact with staples and clean; right groin with mild dehiscence and some scant bloody drainage on bandages.  Swelling lateral to right groin incision.  RN reports it was present when he came on last night.   Extremities:  bilateral feet are warm.  + doppler signal in fem fem bypass Abdomen:  soft  CBC    Component Value Date/Time   WBC 6.8 11/09/2022 0252   RBC 2.50 (L) 11/09/2022 0252   HGB 7.3 (L) 11/09/2022 0252   HGB 13.9 10/27/2020 1038   HCT 22.2 (L) 11/09/2022 0252   HCT 41.0 10/27/2020 1038   PLT 326 11/09/2022 0252   PLT 208 10/27/2020 1038   MCV 88.8 11/09/2022 0252   MCV 90 10/27/2020 1038   MCH 29.2 11/09/2022 0252   MCHC 32.9 11/09/2022 0252   RDW 17.8 (H) 11/09/2022 0252   RDW 12.7 10/27/2020 1038   LYMPHSABS 0.6 (L) 11/01/2022 0415   MONOABS 0.9 11/01/2022 0415   EOSABS 0.0 11/01/2022 0415   BASOSABS 0.0 11/01/2022 0415    BMET    Component Value Date/Time   NA 128 (L) 11/09/2022 0252   NA 138 10/27/2020 1038   K 3.6 11/09/2022 0252   CL 97 (L) 11/09/2022 0252   CO2 24 11/09/2022 0252   GLUCOSE 124 (H) 11/09/2022 0252   BUN 15 11/09/2022 0252   BUN 17 10/27/2020 1038   CREATININE 0.81 11/09/2022 0252   CALCIUM 7.7 (L) 11/09/2022 0252   GFRNONAA >60 11/09/2022 0252   GFRAA >60 07/07/2019 0259    INR    Component Value Date/Time   INR 1.2 08/16/2022 0637     Intake/Output Summary (Last 24 hours) at 11/09/2022 1610 Last data filed at 11/09/2022 0300 Gross per 24 hour  Intake 120 ml  Output 1220 ml  Net -1100 ml      Assessment/Plan:  89 y.o. male is  s/p:  Femoral-femoral bypass graft excision Left common femoral vein primary repair Redo femorofemoral bypass using cadaveric greater saphenous vein Right common femoral to left profunda with interposition bypass to the superficial femoral artery  10/25/2022 And   1) direct repair of right common femoral artery pseudoaneurysm 2) incision and drainage of left groin seroma 3) left sartorius flap to left groin 4) right sartorius flap to right groin 10/31/2022     -pt continues to have + doppler signal in fem fem graft.   -Prevena vacs removed this morning after alarming.  left groin incision looks good with staples in tact.  JP drain with 20cc/24hr (10cc/shift).  Will dc this today.  Right groin as above with some mild bloody drainage on bandage and mild separation of medial right groin incision.   There is some swelling lateral to right groin that was present when night shift RN came on last evening.   -hgb 7.3 this am, stable from yesterday at 7.4.  He does have some swelling lateral to right groin that was present at beginning of night shift last evening.  Pt stable-will continue to  monitor.  Continue to monitor as pt is at high risk for blow out given wound was grossly infected at time of initial graft excision. Abx per ID as he had +BC  -pt ambulated 470 ft yesterday with mobility specialty.   Addendum:  discussed with Dr. Karin Lieu after he saw pt.  Swelling lateral right side present since last week.  Will put prevena back on right groin and medial incision.  Will observe over weekend and if doing ok on Monday, ok to transfer to rehab.    Doreatha Massed, PA-C [facility] [phone] 11/09/2022 6:32 AM   VASCULAR STAFF ADDENDUM: I have independently interviewed and examined the patient. I agree with the above.    Victorino Sparrow MD [facility] Phone Number: [phone] 11/09/2022 10:58 AM

## 2022-11-09 NOTE — Progress Notes (Signed)
Pt,s wound vac alarming leak sponge's raised, ne layer film placed which improved suction but leak alarm continues to ring out. Dr Karin Lieu paged ordered to remove prevena  and pace dry guaze.  Right groin with small amount of serosanguinous drainage, left groin CDI.

## 2022-11-10 DIAGNOSIS — I714 Abdominal aortic aneurysm, without rupture, unspecified: Secondary | ICD-10-CM | POA: Diagnosis not present

## 2022-11-10 DIAGNOSIS — E876 Hypokalemia: Secondary | ICD-10-CM | POA: Diagnosis not present

## 2022-11-10 DIAGNOSIS — T827XXD Infection and inflammatory reaction due to other cardiac and vascular devices, implants and grafts, subsequent encounter: Secondary | ICD-10-CM | POA: Diagnosis not present

## 2022-11-10 DIAGNOSIS — T827XXA Infection and inflammatory reaction due to other cardiac and vascular devices, implants and grafts, initial encounter: Secondary | ICD-10-CM | POA: Diagnosis not present

## 2022-11-10 LAB — CBC
HCT: 23.9 % — ABNORMAL LOW (ref 39.0–52.0)
Hemoglobin: 7.9 g/dL — ABNORMAL LOW (ref 13.0–17.0)
MCH: 29.4 pg (ref 26.0–34.0)
MCHC: 33.1 g/dL (ref 30.0–36.0)
MCV: 88.8 fL (ref 80.0–100.0)
Platelets: 324 10*3/uL (ref 150–400)
RBC: 2.69 MIL/uL — ABNORMAL LOW (ref 4.22–5.81)
RDW: 17.9 % — ABNORMAL HIGH (ref 11.5–15.5)
WBC: 6.9 10*3/uL (ref 4.0–10.5)
nRBC: 0 % (ref 0.0–0.2)

## 2022-11-10 LAB — BASIC METABOLIC PANEL
Anion gap: 12 (ref 5–15)
BUN: 13 mg/dL (ref 8–23)
CO2: 22 mmol/L (ref 22–32)
Calcium: 8.4 mg/dL — ABNORMAL LOW (ref 8.9–10.3)
Chloride: 95 mmol/L — ABNORMAL LOW (ref 98–111)
Creatinine, Ser: 0.81 mg/dL (ref 0.61–1.24)
GFR, Estimated: >60 mL/min (ref >60)
Glucose, Bld: 118 mg/dL — ABNORMAL HIGH (ref 70–99)
Potassium: 3.7 mmol/L (ref 3.5–5.1)
Sodium: 129 mmol/L — ABNORMAL LOW (ref 135–145)

## 2022-11-10 NOTE — TOC Progression Note (Signed)
Transition of Care [facility]) - Progression Note    Patient Details  Name: Edward Campos MRN: [id number] Date of Birth: 07-20-1932  [facility] CM/SW Contact  Inis Sizer, LCSW Phone Number: 11/10/2022, 1:23 PM  Clinical Narrative:    CSW spoke with French Ana at [facility] who states the patient can be admitted once medically stable.  Per MD, patient will be ready either tomorrow or Monday.   Expected Discharge Plan: Skilled Nursing Facility Barriers to Discharge: Continued Medical Work up  Expected Discharge Plan and Services   Discharge Planning Services: CM Consult Post Acute Care Choice: Home Health, Resumption of Svcs/PTA Provider, Skilled Nursing Facility Living arrangements for the past 2 months: Single Family Home                           HH Arranged: PT, OT, Nurse's Aide, RN, Social Work [employer] Agency: [facility] ([facility]) Date HH Agency Contacted: 10/26/22 Time HH Agency Contacted: 1458 Representative spoke with at [facility] Agency: Morrie Sheldon   Social Determinants of Health (SDOH) Interventions SDOH Screenings   Food Insecurity: No Food Insecurity (10/25/2022)  Housing: Low Risk  (10/25/2022)  Transportation Needs: No Transportation Needs (10/25/2022)  Utilities: Not At Risk (10/25/2022)  Tobacco Use: Medium Risk (11/07/2022)    Readmission Risk Interventions     No data to display

## 2022-11-10 NOTE — Progress Notes (Signed)
[facility]                                                                              Tahsin Carse, is a 89 y.o. male, DOB - 1932-04-03, ZOX:[id number] Admit date - 10/22/2022    Outpatient Primary MD for the patient is Patient, No Pcp Per  LOS - 19  days  No chief complaint on file.      Brief summary   Patient is a 89 y.o. male with HLD, PVD s/p bypass grafting, AAA, SCLC hospitalization in July 2024 for infected right to left femoral to femoral bypass graft requiring temporary wound VAC placement who presented with concern of infected femoral bypass with fevers at home.  CT angio abdomen pelvis noted right bifemoral bypass graft with fluid/inflammatory changes associated with the femorofemoral bypass most prominent on the left compatible with a graft infection, and a 5.8 infrarenal abdominal aortic aneurysm grossly unchanged in size. He was started on broad-spectrum antibiotics. He underwent vascular surgery intervention with wound VAC placement on 10/25/2022. Blood cultures on 10/23/2022 grew MRSA. ID was consulted, on IV daptomycin.  Patient underwent direct repair of right common femoral artery pseudoaneurysm; I&D of left groin seroma, left sartorius flap to left groin and right sartorius flap to right groin by vascular surgery on 10/31/2022.    Assessment & Plan    Principal Problem:   Sepsis due to methicillin resistant Staphylococcus aureus (MRSA) (HCC) MRSA bacteremia, POA - Patient met criteria for sepsis on admission with tachycardia, leukocytosis, fevers of 101 Fahrenheit prior to admission.  -CT abdomen pelvis showed right bifemoral bypass graft with fluid/inflammatory changes associated with femoral-femoral bypass most prominent on the left compatible with graft infection. - blood cultures positive for MRSA.  - seen by vascular surgery, underwent femoral-femoral bypass graft excision, left common femoral vein primary repair, redo femoral-femoral bypass  using cadaveric great saphenous vein, right common femoral to left profunda with interposition bypass to the superficial femoral artery on 10/25/2022  -Noted to have significant purulence in left groin, underwent direct repair of right common femoral artery pseudoaneurysm, incision and drainage of left groin seroma, left sartorius flap to left groin, right sartorius flap to right groin on 10/31/2022 -Vascular surgery closely following, bilateral groins vacs, JP drain on the left was placed    -Wound cultures positive for MRSA.  -2D echo showed normal EF, no regional WMA, diastolic dysfunction, no vegetations noted -TEE 8/28: No evidence of endocarditis, EF 60 to 65%, no regional WMA. -Bilateral groin wound vacs, JP drain now out -ID following, on IV daptomycin, stop date on 12/11/2022 -PICC placed  Active problems Normocytic anemia, acute on chronic, likely due to ABLA -Baseline hemoglobin 11-12 -Hb improving, 7.9 today   Acute on chronic hyponatremia -Baseline sodium 128-131. -On initial presentation sodium noted at 122, history of small cell lung CA -Urine osmolality 467, urine sodium 89, likely component of SIADH -Continue salt tabs 1 g 3 times daily,  fluid restriction 1800 cc.  -Sodium improving, 129, asymptomatic, at baseline   Hypokalemia/hypophosphatemia -Currently stable, continue supplementation as needed  Small cell lung cancer with bone metastases -History of stage IV  lung cancer with disease to the bone and prior postobstructive pneumonia. -CT scan showed branching mass like opacity measuring 5.2 x 7.9 cm in the left hilar region/central left lower lobe similar in size to prior. -Chemotherapy noted not to have been initiated due to wounds not being healed yet.  -Outpatient follow-up with primary oncologist, palliative following for goals of care     PAD -Continue aspirin, Plavix -Statin held due to transaminitis.  -Vascular surgery following.      Transaminitis -Improved -CT angiogram abdomen and pelvis with no acute abnormalities noted in the left. -Acute hepatitis panel negative.  Crestor on hold     Hyperlipidemia -Crestor held secondary to rising LFTs.   -LFTs trending down.   -Likely resume Crestor on discharge.   AAA -Patient with 5.8 x 5.7 cm infrarenal abdominal aortic aneurysm unchanged in size. -Vascular surgery following.  Estimated body mass index is 22.18 kg/m as calculated from the following:   Height as of this encounter: 5\' 9"  (1.753 m).   Weight as of this encounter: 68.1 kg.  Code Status: Full code DVT Prophylaxis:  SCD's Start: 10/25/22 1427 heparin injection 5,000 Units Start: 10/22/22 1600 SCDs Start: 10/22/22 1501   Level of Care: Level of care: Progressive Family Communication: Updated patient Disposition Plan:      Remains inpatient appropriate: Medically stable, DC to SNF when bed available   Procedures:  10/25/2022 Femoral-femoral bypass graft excision Left common femoral vein primary repair Redo femorofemoral bypass using cadaveric greater saphenous vein Right common femoral to left profunda with interposition bypass to the superficial femoral artery   10/31/2022 1) direct repair of right common femoral artery pseudoaneurysm 2) incision and drainage of left groin seroma 3) left sartorius flap to left groin 4) right sartorius flap to right groin  2D echo TEE 8/28: No evidence of endocarditis  Consultants:   Vascular surgery Infectious disease Palliative medicine  Antimicrobials:   Anti-infectives (From admission, onward)    Start     Dose/Rate Route Frequency Ordered Stop   10/26/22 0945  DAPTOmycin (CUBICIN) 600 mg in sodium chloride 0.9 % IVPB        8 mg/kg  75.9 kg 124 mL/hr over 30 Minutes Intravenous Daily 10/26/22 0855     10/26/22 0945  cefTRIAXone (ROCEPHIN) 2 g in sodium chloride 0.9 % 100 mL IVPB  Status:  Discontinued        2 g 200 mL/hr over 30 Minutes  Intravenous Every 24 hours 10/26/22 0855 10/26/22 0905   10/25/22 1006  ceFAZolin 1 g / gentamicin 80 mg in NS 500 mL surgical irrigation  Status:  Discontinued          As needed 10/25/22 1006 10/25/22 1318   10/23/22 1145  vancomycin (VANCOREADY) IVPB 1250 mg/250 mL  Status:  Discontinued        1,250 mg 166.7 mL/hr over 90 Minutes Intravenous Every 24 hours 10/23/22 1051 10/26/22 0855   10/23/22 1100  ceFEPIme (MAXIPIME) 2 g in sodium chloride 0.9 % 100 mL IVPB  Status:  Discontinued        2 g 200 mL/hr over 30 Minutes Intravenous Every 12 hours 10/23/22 1041 10/26/22 0855          Medications  sodium chloride   Intravenous Once   aspirin EC  81 mg Oral Daily   Chlorhexidine Gluconate Cloth  6 each Topical Daily   clopidogrel  75 mg Oral Daily   docusate sodium  100 mg Oral Daily  fluticasone  2 spray Each Nare Daily   guaiFENesin  1,200 mg Oral BID   heparin  5,000 Units Subcutaneous Q8H   multivitamin with minerals  1 tablet Oral Daily   pantoprazole  40 mg Oral Daily   sodium chloride flush  10-40 mL Intracatheter Q12H   sodium chloride flush  3 mL Intravenous Q12H   sodium chloride  1 g Oral TID WC      Subjective:   Edward Campos was seen and examined today.  No acute issues noted today.  Bilateral Forbaxin JP drain.  Patient feels he is able to go to rehab.   Objective:   Vitals:   11/10/22 0330 11/10/22 0803 11/10/22 0942 11/10/22 1246  BP: 130/65 134/88 138/70 136/79  Pulse:  90  84  Resp: 19 20 18 14   Temp: 97.8 F (36.6 C) 98.1 F (36.7 C)  97.9 F (36.6 C)  TempSrc: Oral Oral  Oral  SpO2: 95%     Weight: 68.1 kg     Height:        Intake/Output Summary (Last 24 hours) at 11/10/2022 1319 Last data filed at 11/10/2022 1254 Gross per 24 hour  Intake 480 ml  Output 1325 ml  Net -845 ml     Wt Readings from Last 3 Encounters:  11/10/22 68.1 kg  10/22/22 73.7 kg  10/03/22 74.4 kg    Physical Exam General: Alert and oriented x 3,  NAD Cardiovascular: S1 S2 clear, RRR.  Respiratory: CTAB, no wheezing Gastrointestinal: Soft, nontender, nondistended, NBS Ext: no pedal edema bilaterally Neuro: no new deficits Skin: b/l groin vasc out, JP drain out Psych: Normal affect    Data Reviewed:  I have personally reviewed following labs    CBC Lab Results  Component Value Date   WBC 6.9 11/10/2022   RBC 2.69 (L) 11/10/2022   HGB 7.9 (L) 11/10/2022   HCT 23.9 (L) 11/10/2022   MCV 88.8 11/10/2022   MCH 29.4 11/10/2022   PLT 324 11/10/2022   MCHC 33.1 11/10/2022   RDW 17.9 (H) 11/10/2022   LYMPHSABS 0.6 (L) 11/01/2022   MONOABS 0.9 11/01/2022   EOSABS 0.0 11/01/2022   BASOSABS 0.0 11/01/2022     Last metabolic panel Lab Results  Component Value Date   NA 129 (L) 11/10/2022   K 3.7 11/10/2022   CL 95 (L) 11/10/2022   CO2 22 11/10/2022   BUN 13 11/10/2022   CREATININE 0.81 11/10/2022   GLUCOSE 118 (H) 11/10/2022   GFRNONAA >60 11/10/2022   GFRAA >60 07/07/2019   CALCIUM 8.4 (L) 11/10/2022   PHOS 2.3 (L) 10/31/2022   PROT 5.3 (L) 10/31/2022   ALBUMIN 2.2 (L) 10/31/2022   BILITOT 1.1 10/31/2022   ALKPHOS 66 10/31/2022   AST 29 10/31/2022   ALT 43 10/31/2022   ANIONGAP 12 11/10/2022    CBG (last 3)  No results for input(s): "GLUCAP" in the last 72 hours.    Coagulation Profile: No results for input(s): "INR", "PROTIME" in the last 168 hours.   Radiology Studies: I have personally reviewed the imaging studies  No results found.     Thad Ranger M.D. [facility] 11/10/2022, 1:19 PM  Available via Epic secure chat 7am-7pm After 7 pm, please refer to night coverage provider listed on amion.

## 2022-11-10 NOTE — Plan of Care (Signed)

## 2022-11-10 NOTE — Progress Notes (Addendum)
  Progress Note    11/10/2022 7:01 AM 3 Days Post-Op  Subjective:  sleeping, wakes easily.  Says he didn't sleep well but overall doing ok. Denies pain in his feet.  Disappointed he has to wait through the weekend to go to rehab.   Afebrile  Vitals:   11/09/22 2305 11/10/22 0330  BP: 138/80 130/65  Pulse:    Resp: 20 19  Temp: 97.9 F (36.6 C) 97.8 F (36.6 C)  SpO2: 94% 95%    Physical Exam: General:  no distress Cardiac:  regular Lungs:  non labored Incisions:  left groin incision looks good with staples in tact.  Left groin incision with prevena with good seal.  Minimal drainage in cannister.  Extremities:  bilateral feet are warm.   CBC    Component Value Date/Time   WBC 6.8 11/09/2022 0252   RBC 2.50 (L) 11/09/2022 0252   HGB 7.3 (L) 11/09/2022 0252   HGB 13.9 10/27/2020 1038   HCT 22.2 (L) 11/09/2022 0252   HCT 41.0 10/27/2020 1038   PLT 326 11/09/2022 0252   PLT 208 10/27/2020 1038   MCV 88.8 11/09/2022 0252   MCV 90 10/27/2020 1038   MCH 29.2 11/09/2022 0252   MCHC 32.9 11/09/2022 0252   RDW 17.8 (H) 11/09/2022 0252   RDW 12.7 10/27/2020 1038   LYMPHSABS 0.6 (L) 11/01/2022 0415   MONOABS 0.9 11/01/2022 0415   EOSABS 0.0 11/01/2022 0415   BASOSABS 0.0 11/01/2022 0415    BMET    Component Value Date/Time   NA 129 (L) 11/10/2022 0352   NA 138 10/27/2020 1038   K 3.7 11/10/2022 0352   CL 95 (L) 11/10/2022 0352   CO2 22 11/10/2022 0352   GLUCOSE 118 (H) 11/10/2022 0352   BUN 13 11/10/2022 0352   BUN 17 10/27/2020 1038   CREATININE 0.81 11/10/2022 0352   CALCIUM 8.4 (L) 11/10/2022 0352   GFRNONAA >60 11/10/2022 0352   GFRAA >60 07/07/2019 0259    INR    Component Value Date/Time   INR 1.2 08/16/2022 0637     Intake/Output Summary (Last 24 hours) at 11/10/2022 0701 Last data filed at 11/10/2022 0330 Gross per 24 hour  Intake 120 ml  Output 1725 ml  Net -1605 ml      Assessment/Plan:  89 y.o. male is s/p:  Femoral-femoral bypass  graft excision Left common femoral vein primary repair Redo femorofemoral bypass using cadaveric greater saphenous vein Right common femoral to left profunda with interposition bypass to the superficial femoral artery  10/25/2022 And   1) direct repair of right common femoral artery pseudoaneurysm 2) incision and drainage of left groin seroma 3) left sartorius flap to left groin 4) right sartorius flap to right groin 10/31/2022    -pt doing well this morning with brisk fem fem bypass doppler signal.  Bilateral feet are warm -right groin incision is healing nicely and left groin with prevena with good seal and minimal drainage in cannister.  -DVT prophylaxis:  sq heparin -continue to mobilize out of bed -if no issues over the weekend, can transfer to rehab on Monday.   Doreatha Massed, PA-C [facility] [phone] 11/10/2022 7:01 AM  I have independently examined patient and agree with PA assessment and plan above.  Pending rehab  [place] C. Randie Heinz, MD [facility] [place] Office: [phone] Pager: [phone]

## 2022-11-11 DIAGNOSIS — A4102 Sepsis due to Methicillin resistant Staphylococcus aureus: Secondary | ICD-10-CM | POA: Diagnosis not present

## 2022-11-11 DIAGNOSIS — T827XXA Infection and inflammatory reaction due to other cardiac and vascular devices, implants and grafts, initial encounter: Secondary | ICD-10-CM | POA: Diagnosis not present

## 2022-11-11 DIAGNOSIS — T827XXS Infection and inflammatory reaction due to other cardiac and vascular devices, implants and grafts, sequela: Secondary | ICD-10-CM | POA: Diagnosis not present

## 2022-11-11 DIAGNOSIS — T827XXD Infection and inflammatory reaction due to other cardiac and vascular devices, implants and grafts, subsequent encounter: Secondary | ICD-10-CM | POA: Diagnosis not present

## 2022-11-11 LAB — CBC
HCT: 26.4 % — ABNORMAL LOW (ref 39.0–52.0)
Hemoglobin: 8.9 g/dL — ABNORMAL LOW (ref 13.0–17.0)
MCH: 30 pg (ref 26.0–34.0)
MCHC: 33.7 g/dL (ref 30.0–36.0)
MCV: 88.9 fL (ref 80.0–100.0)
Platelets: 341 10*3/uL (ref 150–400)
RBC: 2.97 MIL/uL — ABNORMAL LOW (ref 4.22–5.81)
RDW: 17.8 % — ABNORMAL HIGH (ref 11.5–15.5)
WBC: 7.7 10*3/uL (ref 4.0–10.5)
nRBC: 0 % (ref 0.0–0.2)

## 2022-11-11 LAB — BASIC METABOLIC PANEL
Anion gap: 9 (ref 5–15)
BUN: 14 mg/dL (ref 8–23)
CO2: 23 mmol/L (ref 22–32)
Calcium: 8.2 mg/dL — ABNORMAL LOW (ref 8.9–10.3)
Chloride: 95 mmol/L — ABNORMAL LOW (ref 98–111)
Creatinine, Ser: 0.82 mg/dL (ref 0.61–1.24)
GFR, Estimated: >60 mL/min (ref >60)
Glucose, Bld: 109 mg/dL — ABNORMAL HIGH (ref 70–99)
Potassium: 3.7 mmol/L (ref 3.5–5.1)
Sodium: 127 mmol/L — ABNORMAL LOW (ref 135–145)

## 2022-11-11 NOTE — Progress Notes (Addendum)
  Progress Note    11/11/2022 6:50 AM 4 Days Post-Op  Subjective:  sitting on side of bed eating breakfast.  Ready to get out of hospital.  No complaints.  Denies pain in his feet.   afebrile  Vitals:   11/10/22 2341 11/11/22 0445  BP: 134/79 137/88  Pulse: 78   Resp: 15 20  Temp: 98 F (36.7 C) 98.3 F (36.8 C)  SpO2: 98% 100%    Physical Exam: General:  no distress Lungs:  non labored   CBC    Component Value Date/Time   WBC 7.7 11/11/2022 0500   RBC 2.97 (L) 11/11/2022 0500   HGB 8.9 (L) 11/11/2022 0500   HGB 13.9 10/27/2020 1038   HCT 26.4 (L) 11/11/2022 0500   HCT 41.0 10/27/2020 1038   PLT 341 11/11/2022 0500   PLT 208 10/27/2020 1038   MCV 88.9 11/11/2022 0500   MCV 90 10/27/2020 1038   MCH 30.0 11/11/2022 0500   MCHC 33.7 11/11/2022 0500   RDW 17.8 (H) 11/11/2022 0500   RDW 12.7 10/27/2020 1038   LYMPHSABS 0.6 (L) 11/01/2022 0415   MONOABS 0.9 11/01/2022 0415   EOSABS 0.0 11/01/2022 0415   BASOSABS 0.0 11/01/2022 0415    BMET    Component Value Date/Time   NA 127 (L) 11/11/2022 0500   NA 138 10/27/2020 1038   K 3.7 11/11/2022 0500   CL 95 (L) 11/11/2022 0500   CO2 23 11/11/2022 0500   GLUCOSE 109 (H) 11/11/2022 0500   BUN 14 11/11/2022 0500   BUN 17 10/27/2020 1038   CREATININE 0.82 11/11/2022 0500   CALCIUM 8.2 (L) 11/11/2022 0500   GFRNONAA >60 11/11/2022 0500   GFRAA >60 07/07/2019 0259    INR    Component Value Date/Time   INR 1.2 08/16/2022 0637     Intake/Output Summary (Last 24 hours) at 11/11/2022 0650 Last data filed at 11/10/2022 2109 Gross per 24 hour  Intake 480 ml  Output 850 ml  Net -370 ml      Assessment/Plan:  89 y.o. male is s/p:  Femoral-femoral bypass graft excision Left common femoral vein primary repair Redo femorofemoral bypass using cadaveric greater saphenous vein Right common femoral to left profunda with interposition bypass to the superficial femoral artery  10/25/2022 And   1) direct repair of  right common femoral artery pseudoaneurysm 2) incision and drainage of left groin seroma 3) left sartorius flap to left groin 4) right sartorius flap to right groin 10/31/2022   4 Days Post-Op   -pt doing well this morning. -DVT prophylaxis:  sq heparin -hgb improved over the past couple of days.  -if no issues over weekend, can discharge to rehab Monday.   Doreatha Massed, PA-C [facility] [phone] 11/11/2022 6:50 AM  I have independently interviewed and examined patient and agree with PA assessment and plan above.  Prevena VAC right groin.  Both feet warm well-perfused.  Patient eager for discharge.  Pending rehab.  Ladavia Lindenbaum C. Randie Heinz, MD [facility] [place] Office: [phone] Pager: [phone]

## 2022-11-11 NOTE — Progress Notes (Signed)
Mobility Specialist Progress Note    11/11/22 1013  Mobility  Activity Ambulated with assistance in hallway  Level of Assistance Minimal assist, patient does 75% or more  Assistive Device Front wheel walker  Distance Ambulated (ft) 470 ft  Activity Response Tolerated fair  Mobility Referral Yes  $Mobility charge 1 Mobility  Mobility Specialist Start Time (ACUTE ONLY) 0959  Mobility Specialist Stop Time (ACUTE ONLY) 1012  Mobility Specialist Time Calculation (min) (ACUTE ONLY) 13 min   Pre-Mobility: 87 HR During Mobility: 116 HR Post-Mobility: 110 HR  Pt received in bed and agreeable. Once in hall, pt had x1 LOB requiring assist to recover as pt was trying to reach down laterally for a chord swinging near his leg. No complaints. Took x1 short standing rest break. Returned to bathroom with NT present. RN aware.   [place] Mobility Specialist  Please Neurosurgeon or Rehab Office at [phone]

## 2022-11-11 NOTE — Progress Notes (Signed)
[facility]                                                                              Edward Campos, is a 89 y.o. male, DOB - 05-27-1932, HYQ:[id number] Admit date - 10/22/2022    Outpatient Primary MD for the patient is Patient, No Pcp Per  LOS - 20  days  No chief complaint on file.      Brief summary   Patient is a 89 y.o. male with HLD, PVD s/p bypass grafting, AAA, SCLC hospitalization in July 2024 for infected right to left femoral to femoral bypass graft requiring temporary wound VAC placement who presented with concern of infected femoral bypass with fevers at home.  CT angio abdomen pelvis noted right bifemoral bypass graft with fluid/inflammatory changes associated with the femorofemoral bypass most prominent on the left compatible with a graft infection, and a 5.8 infrarenal abdominal aortic aneurysm grossly unchanged in size. He was started on broad-spectrum antibiotics. He underwent vascular surgery intervention with wound VAC placement on 10/25/2022. Blood cultures on 10/23/2022 grew MRSA. ID was consulted, on IV daptomycin.  Patient underwent direct repair of right common femoral artery pseudoaneurysm; I&D of left groin seroma, left sartorius flap to left groin and right sartorius flap to right groin by vascular surgery on 10/31/2022.    Assessment & Plan    Principal Problem:   Sepsis due to methicillin resistant Staphylococcus aureus (MRSA) (HCC) MRSA bacteremia, POA - Patient met criteria for sepsis on admission with tachycardia, leukocytosis, fevers of 101 Fahrenheit prior to admission.  -CT abdomen pelvis showed right bifemoral bypass graft with fluid/inflammatory changes associated with femoral-femoral bypass most prominent on the left compatible with graft infection. - blood cultures positive for MRSA.  - seen by vascular surgery, underwent femoral-femoral bypass graft excision, left common femoral vein primary repair, redo femoral-femoral bypass  using cadaveric great saphenous vein, right common femoral to left profunda with interposition bypass to the superficial femoral artery on 10/25/2022  -Noted to have significant purulence in left groin, underwent direct repair of right common femoral artery pseudoaneurysm, incision and drainage of left groin seroma, left sartorius flap to left groin, right sartorius flap to right groin on 10/31/2022 -Vascular surgery closely following, bilateral groins vacs, JP drain on the left was placed    -Wound cultures positive for MRSA.  -2D echo showed normal EF, no regional WMA, diastolic dysfunction, no vegetations noted -TEE 8/28: No evidence of endocarditis, EF 60 to 65%, no regional WMA. -Bilateral groin wound vacs, JP drain now out on 8/30 -ID following, on IV daptomycin, stop date on 12/11/2022 -PICC placed -Right groin wound VAC placed again on 8/31  Active problems Normocytic anemia, acute on chronic, likely due to ABLA -Baseline hemoglobin 11-12 -Hemoglobin improving, 8.9   Acute on chronic hyponatremia -Baseline sodium 128-131. -On initial presentation sodium noted at 122, history of small cell lung CA -Urine osmolality 467, urine sodium 89, likely component of SIADH -Continue salt tabs 1 g 3 times daily,  fluid restriction 1800 cc.  -Currently close to baseline, tolerating diet, -If lower tomorrow, increase dose   Hypokalemia/hypophosphatemia -Currently stable, continue supplementation  as needed  Small cell lung cancer with bone metastases -History of stage IV lung cancer with disease to the bone and prior postobstructive pneumonia. -CT scan showed branching mass like opacity measuring 5.2 x 7.9 cm in the left hilar region/central left lower lobe similar in size to prior. -Chemotherapy noted not to have been initiated due to wounds not being healed yet.  -Outpatient follow-up with primary oncologist, palliative following for goals of care     PAD -Continue aspirin, Plavix -Statin  held due to transaminitis.  -Vascular surgery following.     Transaminitis -Improved -CT angiogram abdomen and pelvis with no acute abnormalities noted in the left. -Acute hepatitis panel negative.  Crestor on hold     Hyperlipidemia -Crestor held secondary to rising LFTs.   -LFTs trending down.   -Likely resume Crestor on discharge.   AAA -Patient with 5.8 x 5.7 cm infrarenal abdominal aortic aneurysm unchanged in size. -Vascular surgery following.  Estimated body mass index is 22.18 kg/m as calculated from the following:   Height as of this encounter: 5\' 9"  (1.753 m).   Weight as of this encounter: 68.1 kg.  Code Status: Full code DVT Prophylaxis:  SCD's Start: 10/25/22 1427 heparin injection 5,000 Units Start: 10/22/22 1600 SCDs Start: 10/22/22 1501   Level of Care: Level of care: Progressive Family Communication: Updated patient Disposition Plan:      Remains inpatient appropriate: Medically stable, DC to SNF when bed available, possible in a.m.   Procedures:  10/25/2022 Femoral-femoral bypass graft excision Left common femoral vein primary repair Redo femorofemoral bypass using cadaveric greater saphenous vein Right common femoral to left profunda with interposition bypass to the superficial femoral artery   10/31/2022 1) direct repair of right common femoral artery pseudoaneurysm 2) incision and drainage of left groin seroma 3) left sartorius flap to left groin 4) right sartorius flap to right groin  2D echo TEE 8/28: No evidence of endocarditis  Consultants:   Vascular surgery Infectious disease Palliative medicine  Antimicrobials:   Anti-infectives (From admission, onward)    Start     Dose/Rate Route Frequency Ordered Stop   10/26/22 0945  DAPTOmycin (CUBICIN) 600 mg in sodium chloride 0.9 % IVPB        8 mg/kg  75.9 kg 124 mL/hr over 30 Minutes Intravenous Daily 10/26/22 0855     10/26/22 0945  cefTRIAXone (ROCEPHIN) 2 g in sodium chloride 0.9 %  100 mL IVPB  Status:  Discontinued        2 g 200 mL/hr over 30 Minutes Intravenous Every 24 hours 10/26/22 0855 10/26/22 0905   10/25/22 1006  ceFAZolin 1 g / gentamicin 80 mg in NS 500 mL surgical irrigation  Status:  Discontinued          As needed 10/25/22 1006 10/25/22 1318   10/23/22 1145  vancomycin (VANCOREADY) IVPB 1250 mg/250 mL  Status:  Discontinued        1,250 mg 166.7 mL/hr over 90 Minutes Intravenous Every 24 hours 10/23/22 1051 10/26/22 0855   10/23/22 1100  ceFEPIme (MAXIPIME) 2 g in sodium chloride 0.9 % 100 mL IVPB  Status:  Discontinued        2 g 200 mL/hr over 30 Minutes Intravenous Every 12 hours 10/23/22 1041 10/26/22 0855          Medications  sodium chloride   Intravenous Once   aspirin EC  81 mg Oral Daily   Chlorhexidine Gluconate Cloth  6 each Topical Daily  clopidogrel  75 mg Oral Daily   docusate sodium  100 mg Oral Daily   fluticasone  2 spray Each Nare Daily   guaiFENesin  1,200 mg Oral BID   heparin  5,000 Units Subcutaneous Q8H   multivitamin with minerals  1 tablet Oral Daily   pantoprazole  40 mg Oral Daily   sodium chloride flush  10-40 mL Intracatheter Q12H   sodium chloride flush  3 mL Intravenous Q12H   sodium chloride  1 g Oral TID WC      Subjective:   [name] was seen and examined today.  No acute issues, right groin vac placed on 8/31.  No acute nausea vomiting, fevers or chills.  Hoping to go to rehab tomorrow.   Objective:   Vitals:   11/10/22 2120 11/10/22 2341 11/11/22 0445 11/11/22 1234  BP: 130/69 134/79 137/88 134/74  Pulse: 84 78  87  Resp: 17 15 20 17   Temp: 97.9 F (36.6 C) 98 F (36.7 C) 98.3 F (36.8 C) 97.9 F (36.6 C)  TempSrc: Oral Oral Oral Oral  SpO2: 100% 98% 100%   Weight:   68.1 kg   Height:        Intake/Output Summary (Last 24 hours) at 11/11/2022 1349 Last data filed at 11/10/2022 2109 Gross per 24 hour  Intake 120 ml  Output 850 ml  Net -730 ml     Wt Readings from Last 3  Encounters:  11/11/22 68.1 kg  10/22/22 73.7 kg  10/03/22 74.4 kg   Physical Exam General: Alert and oriented x 3, NAD Cardiovascular: S1 S2 clear, RRR.  Respiratory: CTAB, no wheezing Gastrointestinal: Soft, nontender, nondistended, NBS Ext: no pedal edema bilaterally Neuro: no new deficits Skin: right groin vac + Psych: Normal affect   Data Reviewed:  I have personally reviewed following labs    CBC Lab Results  Component Value Date   WBC 7.7 11/11/2022   RBC 2.97 (L) 11/11/2022   HGB 8.9 (L) 11/11/2022   HCT 26.4 (L) 11/11/2022   MCV 88.9 11/11/2022   MCH 30.0 11/11/2022   PLT 341 11/11/2022   MCHC 33.7 11/11/2022   RDW 17.8 (H) 11/11/2022   LYMPHSABS 0.6 (L) 11/01/2022   MONOABS 0.9 11/01/2022   EOSABS 0.0 11/01/2022   BASOSABS 0.0 11/01/2022     Last metabolic panel Lab Results  Component Value Date   NA 127 (L) 11/11/2022   K 3.7 11/11/2022   CL 95 (L) 11/11/2022   CO2 23 11/11/2022   BUN 14 11/11/2022   CREATININE 0.82 11/11/2022   GLUCOSE 109 (H) 11/11/2022   GFRNONAA >60 11/11/2022   GFRAA >60 07/07/2019   CALCIUM 8.2 (L) 11/11/2022   PHOS 2.3 (L) 10/31/2022   PROT 5.3 (L) 10/31/2022   ALBUMIN 2.2 (L) 10/31/2022   BILITOT 1.1 10/31/2022   ALKPHOS 66 10/31/2022   AST 29 10/31/2022   ALT 43 10/31/2022   ANIONGAP 9 11/11/2022    CBG (last 3)  No results for input(s): "GLUCAP" in the last 72 hours.    Coagulation Profile: No results for input(s): "INR", "PROTIME" in the last 168 hours.   Radiology Studies: I have personally reviewed the imaging studies  No results found.     Thad Ranger M.D. [facility] 11/11/2022, 1:49 PM  Available via Epic secure chat 7am-7pm After 7 pm, please refer to night coverage provider listed on amion.

## 2022-11-12 DIAGNOSIS — T827XXS Infection and inflammatory reaction due to other cardiac and vascular devices, implants and grafts, sequela: Secondary | ICD-10-CM | POA: Diagnosis not present

## 2022-11-12 DIAGNOSIS — B9562 Methicillin resistant Staphylococcus aureus infection as the cause of diseases classified elsewhere: Secondary | ICD-10-CM | POA: Diagnosis not present

## 2022-11-12 DIAGNOSIS — R059 Cough, unspecified: Secondary | ICD-10-CM | POA: Diagnosis not present

## 2022-11-12 DIAGNOSIS — D649 Anemia, unspecified: Secondary | ICD-10-CM | POA: Diagnosis not present

## 2022-11-12 DIAGNOSIS — I739 Peripheral vascular disease, unspecified: Secondary | ICD-10-CM | POA: Diagnosis not present

## 2022-11-12 DIAGNOSIS — I714 Abdominal aortic aneurysm, without rupture, unspecified: Secondary | ICD-10-CM | POA: Diagnosis not present

## 2022-11-12 DIAGNOSIS — Z5189 Encounter for other specified aftercare: Secondary | ICD-10-CM | POA: Diagnosis not present

## 2022-11-12 DIAGNOSIS — R2689 Other abnormalities of gait and mobility: Secondary | ICD-10-CM | POA: Diagnosis not present

## 2022-11-12 DIAGNOSIS — R2681 Unsteadiness on feet: Secondary | ICD-10-CM | POA: Diagnosis not present

## 2022-11-12 DIAGNOSIS — C3432 Malignant neoplasm of lower lobe, left bronchus or lung: Secondary | ICD-10-CM | POA: Diagnosis not present

## 2022-11-12 DIAGNOSIS — E871 Hypo-osmolality and hyponatremia: Secondary | ICD-10-CM | POA: Diagnosis not present

## 2022-11-12 DIAGNOSIS — F4321 Adjustment disorder with depressed mood: Secondary | ICD-10-CM | POA: Diagnosis not present

## 2022-11-12 DIAGNOSIS — E440 Moderate protein-calorie malnutrition: Secondary | ICD-10-CM | POA: Diagnosis not present

## 2022-11-12 DIAGNOSIS — K219 Gastro-esophageal reflux disease without esophagitis: Secondary | ICD-10-CM | POA: Diagnosis not present

## 2022-11-12 DIAGNOSIS — R7881 Bacteremia: Secondary | ICD-10-CM | POA: Diagnosis not present

## 2022-11-12 DIAGNOSIS — Z7901 Long term (current) use of anticoagulants: Secondary | ICD-10-CM | POA: Diagnosis not present

## 2022-11-12 DIAGNOSIS — Z48812 Encounter for surgical aftercare following surgery on the circulatory system: Secondary | ICD-10-CM | POA: Diagnosis not present

## 2022-11-12 DIAGNOSIS — M6281 Muscle weakness (generalized): Secondary | ICD-10-CM | POA: Diagnosis not present

## 2022-11-12 DIAGNOSIS — E785 Hyperlipidemia, unspecified: Secondary | ICD-10-CM | POA: Diagnosis not present

## 2022-11-12 DIAGNOSIS — E876 Hypokalemia: Secondary | ICD-10-CM | POA: Diagnosis not present

## 2022-11-12 DIAGNOSIS — K5900 Constipation, unspecified: Secondary | ICD-10-CM | POA: Diagnosis not present

## 2022-11-12 DIAGNOSIS — T827XXD Infection and inflammatory reaction due to other cardiac and vascular devices, implants and grafts, subsequent encounter: Secondary | ICD-10-CM | POA: Diagnosis not present

## 2022-11-12 DIAGNOSIS — J309 Allergic rhinitis, unspecified: Secondary | ICD-10-CM | POA: Diagnosis not present

## 2022-11-12 DIAGNOSIS — T827XXA Infection and inflammatory reaction due to other cardiac and vascular devices, implants and grafts, initial encounter: Secondary | ICD-10-CM | POA: Diagnosis not present

## 2022-11-12 DIAGNOSIS — C3492 Malignant neoplasm of unspecified part of left bronchus or lung: Secondary | ICD-10-CM | POA: Diagnosis not present

## 2022-11-12 DIAGNOSIS — R7401 Elevation of levels of liver transaminase levels: Secondary | ICD-10-CM | POA: Diagnosis not present

## 2022-11-12 DIAGNOSIS — A4102 Sepsis due to Methicillin resistant Staphylococcus aureus: Secondary | ICD-10-CM | POA: Diagnosis not present

## 2022-11-12 DIAGNOSIS — Z7401 Bed confinement status: Secondary | ICD-10-CM | POA: Diagnosis not present

## 2022-11-12 LAB — CBC
HCT: 24.3 % — ABNORMAL LOW (ref 39.0–52.0)
Hemoglobin: 8.1 g/dL — ABNORMAL LOW (ref 13.0–17.0)
MCH: 30 pg (ref 26.0–34.0)
MCHC: 33.3 g/dL (ref 30.0–36.0)
MCV: 90 fL (ref 80.0–100.0)
Platelets: 297 10*3/uL (ref 150–400)
RBC: 2.7 MIL/uL — ABNORMAL LOW (ref 4.22–5.81)
RDW: 18 % — ABNORMAL HIGH (ref 11.5–15.5)
WBC: 6.6 10*3/uL (ref 4.0–10.5)
nRBC: 0 % (ref 0.0–0.2)

## 2022-11-12 LAB — COMPREHENSIVE METABOLIC PANEL
ALT: 23 U/L (ref 0–44)
AST: 26 U/L (ref 15–41)
Albumin: 2.2 g/dL — ABNORMAL LOW (ref 3.5–5.0)
Alkaline Phosphatase: 82 U/L (ref 38–126)
Anion gap: 7 (ref 5–15)
BUN: 16 mg/dL (ref 8–23)
CO2: 24 mmol/L (ref 22–32)
Calcium: 7.9 mg/dL — ABNORMAL LOW (ref 8.9–10.3)
Chloride: 96 mmol/L — ABNORMAL LOW (ref 98–111)
Creatinine, Ser: 0.89 mg/dL (ref 0.61–1.24)
GFR, Estimated: >60 mL/min (ref >60)
Glucose, Bld: 116 mg/dL — ABNORMAL HIGH (ref 70–99)
Potassium: 3.9 mmol/L (ref 3.5–5.1)
Sodium: 127 mmol/L — ABNORMAL LOW (ref 135–145)
Total Bilirubin: 1 mg/dL (ref 0.3–1.2)
Total Protein: 5.5 g/dL — ABNORMAL LOW (ref 6.5–8.1)

## 2022-11-12 MED ORDER — HEPARIN SOD (PORK) LOCK FLUSH 100 UNIT/ML IV SOLN
250.0000 [IU] | INTRAVENOUS | Status: AC | PRN
  Administered 2022-11-12: 250 [IU]

## 2022-11-12 MED ORDER — GUAIFENESIN-DM 100-10 MG/5ML PO SYRP: 15.0000 mL | ORAL_SOLUTION | ORAL | Status: DC | PRN

## 2022-11-12 MED ORDER — SENNOSIDES-DOCUSATE SODIUM 8.6-50 MG PO TABS: 1.0000 | ORAL_TABLET | Freq: Every evening | ORAL | Status: DC | PRN

## 2022-11-12 MED ORDER — PANTOPRAZOLE SODIUM 40 MG PO TBEC: 40.0000 mg | DELAYED_RELEASE_TABLET | Freq: Every day | ORAL | Status: DC

## 2022-11-12 MED ORDER — DOCUSATE SODIUM 100 MG PO CAPS: 100.0000 mg | ORAL_CAPSULE | Freq: Every day | ORAL | Status: DC

## 2022-11-12 MED ORDER — DAPTOMYCIN IV (FOR PTA / DISCHARGE USE ONLY): 600.0000 mg | INTRAVENOUS | 0 refills | Status: AC

## 2022-11-12 MED ORDER — GUAIFENESIN ER 600 MG PO TB12: 1200.0000 mg | ORAL_TABLET | Freq: Two times a day (BID) | ORAL | Status: AC

## 2022-11-12 MED ORDER — CLOPIDOGREL BISULFATE 75 MG PO TABS: 75.0000 mg | ORAL_TABLET | Freq: Every day | ORAL | Status: DC

## 2022-11-12 MED ORDER — FLUTICASONE PROPIONATE 50 MCG/ACT NA SUSP: 2.0000 | Freq: Every day | NASAL | Status: DC

## 2022-11-12 MED ORDER — SODIUM CHLORIDE 1 G PO TABS: 1.0000 g | ORAL_TABLET | Freq: Three times a day (TID) | ORAL | Status: DC

## 2022-11-12 NOTE — Discharge Summary (Signed)
Physician Discharge Summary   Patient: Edward Campos MRN: [id number] DOB: July 01, 1932  Admit date:     10/22/2022  Discharge date: 11/12/22  Discharge Physician: Thad Ranger, MD    PCP: Patient, No Pcp Per   Recommendations at discharge:   Continue IV Daptomycin 600 mg IV daily, stop date on 12/11/2022 Outpatient follow-up with infectious disease scheduled  Discharge Diagnoses:    Sepsis due to methicillin resistant Staphylococcus aureus (MRSA) (HCC) MRSA bacteremia   Infection of vascular bypass graft (HCC)   Peripheral vascular disease (HCC)   Small cell lung cancer, left lower lobe (HCC) Normocytic anemia, acute on chronic   Hypokalemia    Hypophosphatemia   Transaminitis   Acute on chronic hyponatremia   AAA (abdominal aortic aneurysm) (HCC)   PAD (peripheral artery disease) (HCC)   Hyperlipidemia     Hospital Course:  Patient is a 89 y.o. male with HLD, PVD s/p bypass grafting, AAA, SCLC hospitalization in July 2024 for infected right to left femoral to femoral bypass graft requiring temporary wound VAC placement who presented with concern of infected femoral bypass with fevers at home.  CT angio abdomen pelvis noted right bifemoral bypass graft with fluid/inflammatory changes associated with the femorofemoral bypass most prominent on the left compatible with a graft infection, and a 5.8 infrarenal abdominal aortic aneurysm grossly unchanged in size. He was started on broad-spectrum antibiotics. He underwent vascular surgery intervention with wound VAC placement on 10/25/2022. Blood cultures on 10/23/2022 grew MRSA. ID was consulted, on IV daptomycin.  Patient underwent direct repair of right common femoral artery pseudoaneurysm; I&D of left groin seroma, left sartorius flap to left groin and right sartorius flap to right groin by vascular surgery on 10/31/2022.      Assessment and Plan:   Sepsis due to methicillin resistant Staphylococcus aureus (MRSA) (HCC) MRSA  bacteremia, POA - Patient met criteria for sepsis on admission with tachycardia, leukocytosis, fevers of 101 Fahrenheit prior to admission.  -CT abdomen pelvis showed right bifemoral bypass graft with fluid/inflammatory changes associated with femoral-femoral bypass most prominent on the left compatible with graft infection. - blood cultures positive for MRSA.  -Vascular surgery was consulted, underwent femoral-femoral bypass graft excision, left common femoral vein primary repair, redo femoral-femoral bypass using cadaveric great saphenous vein, right common femoral to left profunda with interposition bypass to the superficial femoral artery on 10/25/2022  -Noted to have significant purulence in left groin, underwent direct repair of right common femoral artery pseudoaneurysm, incision and drainage of left groin seroma, left sartorius flap to left groin, right sartorius flap to right groin on 10/31/2022 - Bilateral groins vacs, JP drain on the left were placed    -Wound cultures positive for MRSA.  -2D echo showed normal EF, no regional WMA, diastolic dysfunction, no vegetations noted -TEE 8/28: No evidence of endocarditis, EF 60 to 65%, no regional WMA. -Bilateral groin wound vacs, JP drain were out on 8/30 -ID following, on IV daptomycin, stop date on 12/11/2022, PICC placed -Right groin prevena VAC placed again on 8/31, outpatient follow-up with vascular surgery    Normocytic anemia, acute on chronic, likely due to ABLA -Baseline hemoglobin 11-12 -Hemoglobin 8.1 at discharge    Acute on chronic hyponatremia -Baseline sodium 128-131. -On initial presentation sodium noted at 122, history of small cell lung CA -Urine osmolality 467, urine sodium 89, likely component of SIADH -Continue salt tabs 1 g 3 times daily,  fluid restriction 1800 cc.  Na 127, stable, asymptomatic, tolerating diet.  Hypokalemia/hypophosphatemia -Currently stable, continue supplementation as needed   Small cell lung  cancer with bone metastases -History of stage IV lung cancer with disease to the bone and prior postobstructive pneumonia. -CT scan showed branching mass like opacity measuring 5.2 x 7.9 cm in the left hilar region/central left lower lobe similar in size to prior. -Chemotherapy noted not to have been initiated due to wounds not being healed yet.  -Outpatient follow-up with primary oncologist, Dr. Ellin Saba  -recommended outpatient palliative medicine for goals of care.      PAD -Continue aspirin, Plavix.  Statins were held due to transaminitis -Vascular surgery following.   -Resume statin, LFTs have improved   Transaminitis -Improved -CT angiogram abdomen and pelvis with no acute abnormalities noted in the left. -Acute hepatitis panel negative.  Crestor on hold      Hyperlipidemia -Crestor held secondary to rising LFTs.   -No Crestor, LFTs have improved   AAA -Patient with 5.8 x 5.7 cm infrarenal abdominal aortic aneurysm unchanged in size. -Output follow-up with vascular surgery   Estimated body mass index is 22.18 kg/m as calculated from the following:   Height as of this encounter: 5\' 9"  (1.753 m).   Weight as of this encounter: 68.1 kg.       Pain control - [employer] Controlled Substance Reporting System database was reviewed. and patient was instructed, not to drive, operate heavy machinery, perform activities at heights, swimming or participation in water activities or provide baby-sitting services while on Pain, Sleep and Anxiety Medications; until their outpatient Physician has advised to do so again. Also recommended to not to take more than prescribed Pain, Sleep and Anxiety Medications.  Consultants: Vascular surgery Procedures performed: As above Disposition: Skilled nursing facility Diet recommendation:  Discharge Diet Orders (From admission, onward)     Start     Ordered   11/12/22 0000  Diet general        11/12/22 0954           Regular diet  with 1800 cc/day fluid restriction   DISCHARGE MEDICATION: Allergies as of 11/12/2022       Reactions   Penicillins Other (See Comments)        Medication List     TAKE these medications    aspirin EC 81 MG tablet Take 1 tablet (81 mg total) by mouth daily. Swallow whole. What changed: additional instructions   clopidogrel 75 MG tablet Commonly known as: PLAVIX Take 1 tablet (75 mg total) by mouth daily. Start taking on: November 13, 2022   daptomycin IVPB Commonly known as: CUBICIN Inject 600 mg into the vein daily. Indication:  MRSA bacteremia/endovascular infection First Dose: Yes Last Day of Therapy:  12/11/22 Labs - Once weekly:  CBC/D, BMP, and CPK Labs - Once weekly: ESR and CRP Method of administration: IV Push or per SNF protocol Pull PICC line at the completion of IV antibiotic therapy Method of administration may be changed at the discretion of home infusion pharmacist based upon assessment of the patient and/or caregiver's ability to self-administer the medication ordered.   diphenhydramine-acetaminophen 25-500 MG Tabs tablet Commonly known as: TYLENOL PM Take 1 tablet by mouth at bedtime as needed (For sleep).   docusate sodium 100 MG capsule Commonly known as: COLACE Take 1 capsule (100 mg total) by mouth daily. Start taking on: November 13, 2022   fluticasone 50 MCG/ACT nasal spray Commonly known as: FLONASE Place 2 sprays into both nostrils daily. Start taking on: November 13, 2022  guaiFENesin 600 MG 12 hr tablet Commonly known as: MUCINEX Take 2 tablets (1,200 mg total) by mouth 2 (two) times daily for 7 days.   guaiFENesin-dextromethorphan 100-10 MG/5ML syrup Commonly known as: ROBITUSSIN DM Take 15 mLs by mouth every 4 (four) hours as needed for cough.   loperamide 2 MG tablet Commonly known as: Imodium A-D Take 1 tablet (2 mg total) by mouth 2 (two) times daily as needed for diarrhea or loose stools.   multivitamin with minerals  tablet Take 1 tablet by mouth daily.   pantoprazole 40 MG tablet Commonly known as: PROTONIX Take 1 tablet (40 mg total) by mouth daily. Start taking on: November 13, 2022   rosuvastatin 20 MG tablet Commonly known as: CRESTOR TAKE 1 TABLET(20 MG) BY MOUTH DAILY What changed: See the new instructions.   senna-docusate 8.6-50 MG tablet Commonly known as: Senokot-S Take 1 tablet by mouth at bedtime as needed for mild constipation.   sodium chloride 1 g tablet Take 1 tablet (1 g total) by mouth 3 (three) times daily with meals.               Home Infusion Instuctions  (From admission, onward)           Start     Ordered   11/12/22 0000  Home infusion instructions       Comments: Planned discharge to SNF  Question:  Instructions  Answer:  Flushing of vascular access device: 0.9% NaCl pre/post medication administration and prn patency; Heparin 100 u/ml, 5ml for implanted ports and Heparin 10u/ml, 5ml for all other central venous catheters.   11/12/22 0954              Discharge Care Instructions  (From admission, onward)           Start     Ordered   11/12/22 0000  Discharge wound care:       Comments: Negative pressure wound with Prevena vac, right groin   11/12/22 5284            Contact information for follow-up providers     [facility] Follow up.   Why: Your home health agency        [facility] Follow up in 2 week(s).   Specialty: Vascular Surgery Why: Office will call you to arrange your appt (sent). Contact information: [address] [place] [address] [phone]        Danelle Earthly, MD Follow up on 11/27/2022.   Specialty: Infectious Diseases Why: At 3:30 PM, for hospital follow-up Contact information: [address], [address] [place] [address] [phone]              Contact information for after-discharge care     Destination      [facility] [place] [place] Preferred SNF .   Service: Skilled Nursing Contact information: [address] [place] [address] [phone]                    Discharge Exam: Ceasar Mons Weights   11/10/22 0330 11/11/22 0445 11/12/22 0525  Weight: 68.1 kg 68.1 kg 66.9 kg   S: Eating breakfast, wants to go to rehab today.  No acute complaints.  Wound VAC on right groin still on.  No acute issues overnight.  BP 115/73 (BP Location: Right Arm)   Pulse 81   Temp 98 F (36.7 C) (Oral)   Resp 19  Ht 5\' 9"  (1.753 m)   Wt 66.9 kg   SpO2 100%   BMI 21.78 kg/m   Physical Exam General: Alert and oriented x 3, NAD Cardiovascular: S1 S2 clear, RRR.  Respiratory: CTAB, no wheezing, rales or rhonchi Gastrointestinal: Soft, nontender, nondistended, NBS Ext: no pedal edema bilaterally Neuro: no new deficits Skin: wound vac right groin+ Psych: Normal affect   Condition at discharge: fair  The results of significant diagnostics from this hospitalization (including imaging, microbiology, ancillary and laboratory) are listed below for reference.   Imaging Studies: ECHO TEE  Result Date: 11/08/2022    TRANSESOPHOGEAL ECHO REPORT   Patient Name:   LEANTHONY HOOD Date of Exam: 11/07/2022 Medical Rec #:  [id number]      Height:       69.0 in Accession #:    [id number]     Weight:       163.8 lb Date of Birth:  [birth year]       BSA:          1.898 m Patient Age:    89 years       BP:           120/74 mmHg Patient Gender: M              HR:           89 bpm. Exam Location:  Inpatient Procedure: Transesophageal Echo, Color Doppler and Cardiac Doppler Indications:     Bacteremia  History:         Patient has prior history of Echocardiogram examinations, most                  recent 10/27/2022. PAD; Risk Factors:Dyslipidemia.  Sonographer:     Delcie Roch RDCS Referring Phys:  [id number] RHONDA G BARRETT Diagnosing Phys: Jodelle Red MD PROCEDURE: After discussion  of the risks and benefits of a TEE, an informed consent was obtained from the patient. The transesophogeal probe was passed without difficulty through the esophogus of the patient. Imaged were obtained with the patient in a left lateral decubitus position. Sedation performed by different physician. The patient was monitored while under deep sedation. Anesthestetic sedation was provided intravenously by Anesthesiology: 174mg  of Propofol, 60mg  of Lidocaine. The patient's vital signs; including heart rate, blood pressure, and oxygen saturation; remained stable throughout the procedure. The patient developed no complications during the procedure.  IMPRESSIONS  1. Left ventricular ejection fraction, by estimation, is 60 to 65%. The left ventricle has normal function. The left ventricle has no regional wall motion abnormalities.  2. Right ventricular systolic function is normal. The right ventricular size is normal.  3. No left atrial/left atrial appendage thrombus was detected.  4. Restricted motion of posterior leaflet. The mitral valve is normal in structure. Mild mitral valve regurgitation. No evidence of mitral stenosis.  5. The aortic valve is tricuspid. There is mild calcification of the aortic valve. Aortic valve regurgitation is trivial. No aortic stenosis is present.  6. There is Moderate (Grade III) plaque involving the descending aorta. Conclusion(s)/Recommendation(s): No evidence of vegetation/infective endocarditis on this transesophageael echocardiogram. FINDINGS  Left Ventricle: Left ventricular ejection fraction, by estimation, is 60 to 65%. The left ventricle has normal function. The left ventricle has no regional wall motion abnormalities. The left ventricular internal cavity size was normal in size. Right Ventricle: The right ventricular size is normal. No increase in right ventricular wall thickness. Right ventricular systolic function is normal. Left Atrium: Left atrial size was normal  in size. No  left atrial/left atrial appendage thrombus was detected. Right Atrium: Right atrial size was normal in size. Pericardium: There is no evidence of pericardial effusion. Mitral Valve: Restricted motion of posterior leaflet. The mitral valve is normal in structure. Mild mitral valve regurgitation. No evidence of mitral valve stenosis. There is no evidence of mitral valve vegetation. Tricuspid Valve: The tricuspid valve is normal in structure. Tricuspid valve regurgitation is mild . No evidence of tricuspid stenosis. There is no evidence of tricuspid valve vegetation. Aortic Valve: The aortic valve is tricuspid. There is mild calcification of the aortic valve. Aortic valve regurgitation is trivial. No aortic stenosis is present. There is no evidence of aortic valve vegetation. Pulmonic Valve: The pulmonic valve was grossly normal. Pulmonic valve regurgitation is trivial. No evidence of pulmonic stenosis. There is no evidence of pulmonic valve vegetation. Aorta: The aortic root and ascending aorta are structurally normal, with no evidence of dilitation. There is moderate (Grade III) plaque involving the descending aorta. IAS/Shunts: No atrial level shunt detected by color flow Doppler. Additional Comments: Spectral Doppler performed. TRICUSPID VALVE TR Peak grad:   27.0 mmHg TR Vmax:        260.00 cm/s Jodelle Red MD Electronically signed by Jodelle Red MD Signature Date/Time: 11/08/2022/1:33:08 PM    Final    EP STUDY  Result Date: 11/07/2022 See surgical note for result.  [place] EKG SITE RITE  Result Date: 11/06/2022 If [facility] image not attached, placement could not be confirmed due to current cardiac rhythm.  PERIPHERAL VASCULAR CATHETERIZATION  Result Date: 10/31/2022 See surgical note for result.  CT Angio Abd/Pel w/ and/or w/o  Result Date: 10/30/2022 CLINICAL DATA:  Abdominal aortic aneurysm and blood loss status post fem-fem bypass graft. EXAM: CTA ABDOMEN AND PELVIS WITHOUT AND  WITH CONTRAST TECHNIQUE: Multidetector CT imaging of the abdomen and pelvis was performed using the standard protocol during bolus administration of intravenous contrast. Multiplanar reconstructed images and MIPs were obtained and reviewed to evaluate the vascular anatomy. RADIATION DOSE REDUCTION: This exam was performed according to the departmental dose-optimization program which includes automated exposure control, adjustment of the mA and/or kV according to patient size and/or use of iterative reconstruction technique. CONTRAST:  75 mL OMNIPAQUE IOHEXOL 350 MG/ML SOLN COMPARISON:  Recent prior CTA of the abdomen and pelvis 06/22/2022 FINDINGS: VASCULAR Aorta: Fusiform aneurysmal dilation of the infrarenal abdominal aorta measuring 5.9 x 6.0 cm (best measured on the coronal and sagittal reformatted images). This is essentially unchanged compared to recent prior imaging. Extensive atherosclerotic plaque. Celiac: Bulky calcified plaque at the origin of the celiac axis results in moderate to high-grade stenosis. SMA: Patent without evidence of aneurysm, dissection, vasculitis or significant stenosis. Renals: Single left renal artery. Dual right-sided renal arteries. Calcified plaque results in moderate stenosis at the origin of the left renal artery. Mild to moderate stenosis at the origin of the superior right renal artery. Focal high-grade stenosis at the origin of the inferior right renal artery. IMA: Likely occluded at the origin. Inflow: Calcified atherosclerotic plaque throughout the common iliac arteries. High tortuosity of the left common iliac artery. Chronic occlusion of the right internal iliac artery. Mild aneurysmal dilation of the left internal iliac artery up to 1.3 cm. 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 Patent stent system in the right external iliac artery. The chronic left iliac artery occlusion extends into the common femoral artery. Proximal Outflow: Interval revision fem-fem  bypass graft. The previous scattered graft has been  removed and replaced by a smaller 4 mm right to left fem-fem bypass graft. Mildly irregular and patulous anastomosis at the distal left common femoral artery. New more proximal occlusion of the right superficial femoral artery beginning almost immediately distal to the bypass graft. Hematoma surrounds the bypass graft and also extends into the left upper thigh and along the right inguinal cutdown. No evidence of active extravasation. Veins: No focal venous abnormality. Review of the MIP images confirms the above findings. NON-VASCULAR Lower chest: Extensive mucoid impaction of the visible left lower lobe bronchi. Small left and trace right pleural effusions. Centrilobular pulmonary emphysema. Hepatobiliary: Normal hepatic contour morphology. No discrete hepatic lesion. Small stones present in the gallbladder lumen. Pancreas: Unremarkable. No pancreatic ductal dilatation or surrounding inflammatory changes. Spleen: No splenic injury or perisplenic hematoma. Adrenals/Urinary Tract: Normal adrenal glands. No hydronephrosis, nephrolithiasis or enhancing renal mass. Unremarkable bladder. Stomach/Bowel: No focal bowel wall thickening or evidence of obstruction. Lymphatic: No suspicious lymphadenopathy. Reproductive: Prostatomegaly. Other: Surgical changes of revision fem-fem bypass graft with extensive hematoma tracking along the graft, in the right inguinal region and extending down the superficial fat of the left upper thigh. Musculoskeletal: No acute fracture or aggressive appearing lytic or blastic osseous lesion. IMPRESSION: 1. Interval revision fem-fem bypass graft. The previous graft has been resected and there is a new smaller 4 mm graft which is widely patent. Similar appearance of irregular patulous common femoral arteries at the anastomoses bilaterally but no evidence of active extravasation. 2. Hematoma extends along the tunnel course of the graft as well as  within the right inguinal cutdown bed and left inguinal cutdown bed with additional hematoma extending in the superficial subcutaneous fat along the anterior upper thigh. No evidence of active bleeding or obvious arterial injury/pseudoaneurysm. 3. Stable appearance of significant fusiform infrarenal abdominal aortic aneurysm measuring up to 6 cm (unchanged compared to recent prior imaging on my measurements). Recommend continued care by vascular surgery. This recommendation follows ACR consensus guidelines: White Paper of the ACR Incidental Findings Committee II on Vascular Findings. J Am Coll Radiol 2013; 10:789-794. 4. Extensive mucoid impaction within the visualized left lower lobe bronchi. 5. Small left and trace right pleural effusions. 6. Centrilobular pulmonary emphysema. 7. Additional ancillary findings as above. Electronically Signed   By: Malachy Moan M.D.   On: 10/30/2022 07:21   ECHOCARDIOGRAM COMPLETE  Result Date: 10/27/2022    ECHOCARDIOGRAM REPORT   Patient Name:   AAYAM TISCARENO Date of Exam: 10/27/2022 Medical Rec #:  [id number]      Height:       69.0 in Accession #:    [id number]     Weight:       167.3 lb Date of Birth:  [birth year]       BSA:          1.915 m Patient Age:    89 years       BP:           155/67 mmHg Patient Gender: M              HR:           81 bpm. Exam Location:  Inpatient Procedure: 2D Echo, Cardiac Doppler and Color Doppler Indications:    Bacteremia  History:        Patient has prior history of Echocardiogram examinations, most                 recent 11/10/2020. PVD; Risk Factors:Dyslipidemia. SCLC, AAA.  Sonographer:  Milda Smart Referring Phys: [id number] DANIEL V THOMPSON  Sonographer Comments: Image acquisition challenging due to patient body habitus and Image acquisition challenging due to respiratory motion. IMPRESSIONS  1. Left ventricular ejection fraction, by estimation, is 60 to 65%. The left ventricle has normal function. The left ventricle has no regional  wall motion abnormalities. There is moderate concentric left ventricular hypertrophy. Left ventricular diastolic parameters are consistent with Grade I diastolic dysfunction (impaired relaxation). Elevated left atrial pressure.  2. Right ventricular systolic function is normal. The right ventricular size is normal.  3. Left atrial size was mildly dilated.  4. The mitral valve is normal in structure. Mild mitral valve regurgitation. No evidence of mitral stenosis. Moderate mitral annular calcification.  5. The aortic valve is tricuspid. Aortic valve regurgitation is not visualized. No aortic stenosis is present.  6. Aortic dilatation noted. There is borderline dilatation of the aortic root, measuring 39 mm.  7. The inferior vena cava is normal in size with greater than 50% respiratory variability, suggesting right atrial pressure of 3 mmHg. FINDINGS  Left Ventricle: Left ventricular ejection fraction, by estimation, is 60 to 65%. The left ventricle has normal function. The left ventricle has no regional wall motion abnormalities. The left ventricular internal cavity size was normal in size. There is  moderate concentric left ventricular hypertrophy. Left ventricular diastolic parameters are consistent with Grade I diastolic dysfunction (impaired relaxation). Elevated left atrial pressure. Right Ventricle: The right ventricular size is normal. Right ventricular systolic function is normal. Left Atrium: Left atrial size was mildly dilated. Right Atrium: Right atrial size was normal in size. Pericardium: There is no evidence of pericardial effusion. Mitral Valve: The mitral valve is normal in structure. Moderate mitral annular calcification. Mild mitral valve regurgitation. No evidence of mitral valve stenosis. MV peak gradient, 8.2 mmHg. The mean mitral valve gradient is 4.0 mmHg. Tricuspid Valve: The tricuspid valve is normal in structure. Tricuspid valve regurgitation is mild . No evidence of tricuspid stenosis.  Aortic Valve: The aortic valve is tricuspid. Aortic valve regurgitation is not visualized. No aortic stenosis is present. Pulmonic Valve: The pulmonic valve was grossly normal. Pulmonic valve regurgitation is trivial. No evidence of pulmonic stenosis. Aorta: Aortic dilatation noted. There is borderline dilatation of the aortic root, measuring 39 mm. Venous: The inferior vena cava is normal in size with greater than 50% respiratory variability, suggesting right atrial pressure of 3 mmHg. IAS/Shunts: No atrial level shunt detected by color flow Doppler.  LEFT VENTRICLE PLAX 2D LVIDd:         3.70 cm     Diastology LVIDs:         3.10 cm     LV e' medial:    5.44 cm/s LV PW:         1.50 cm     LV E/e' medial:  19.7 LV IVS:        1.40 cm     LV e' lateral:   6.09 cm/s LVOT diam:     2.50 cm     LV E/e' lateral: 17.6 LV SV:         116 LV SV Index:   60 LVOT Area:     4.91 cm  LV Volumes (MOD) LV vol d, MOD A2C: 58.7 ml LV vol d, MOD A4C: 76.9 ml LV vol s, MOD A2C: 21.0 ml LV vol s, MOD A4C: 20.5 ml LV SV MOD A2C:     37.7 ml LV SV MOD A4C:  76.9 ml LV SV MOD BP:      48.8 ml RIGHT VENTRICLE             IVC RV S prime:     20.40 cm/s  IVC diam: 1.60 cm TAPSE (M-mode): 3.7 cm LEFT ATRIUM             Index        RIGHT ATRIUM           Index LA diam:        3.90 cm 2.04 cm/m   RA Area:     16.30 cm LA Vol (A2C):   68.2 ml 35.61 ml/m  RA Volume:   41.00 ml  21.41 ml/m LA Vol (A4C):   61.3 ml 32.01 ml/m LA Biplane Vol: 64.7 ml 33.78 ml/m  AORTIC VALVE LVOT Vmax:   105.15 cm/s LVOT Vmean:  75.500 cm/s LVOT VTI:    0.236 m  AORTA Ao Root diam: 3.60 cm Ao Asc diam:  3.90 cm MITRAL VALVE                TRICUSPID VALVE MV Area (PHT): 2.24 cm     TR Peak grad:   21.3 mmHg MV Area VTI:   2.61 cm     TR Vmax:        231.00 cm/s MV Peak grad:  8.2 mmHg MV Mean grad:  4.0 mmHg     SHUNTS MV Vmax:       1.43 m/s     Systemic VTI:  0.24 m MV Vmean:      101.0 cm/s   Systemic Diam: 2.50 cm MV Decel Time: 338 msec MR Peak  grad: 32.9 mmHg MR Vmax:      287.00 cm/s MV E velocity: 107.00 cm/s MV A velocity: 129.00 cm/s MV E/A ratio:  0.83 Olga Millers MD Electronically signed by Olga Millers MD Signature Date/Time: 10/27/2022/2:38:00 PM    Final    CT Angio Abd/Pel w/ and/or w/o  Result Date: 10/22/2022 CLINICAL DATA:  Bypass infection, small cell lung cancer EXAM: CTA ABDOMEN AND PELVIS WITHOUT AND WITH CONTRAST TECHNIQUE: Multidetector CT imaging of the abdomen and pelvis was performed using the standard protocol during bolus administration of intravenous contrast. Multiplanar reconstructed images and MIPs were obtained and reviewed to evaluate the vascular anatomy. RADIATION DOSE REDUCTION: This exam was performed according to the departmental dose-optimization program which includes automated exposure control, adjustment of the mA and/or kV according to patient size and/or use of iterative reconstruction technique. CONTRAST:  75mL OMNIPAQUE IOHEXOL 350 MG/ML SOLN COMPARISON:  CTA chest dated 09/24/2022. PET-CT dated 09/06/2022. CTA abdomen/pelvis dated 08/16/2022. FINDINGS: VASCULAR Aorta: 5.8 x 5.7 cm infrarenal abdominal aortic aneurysm (series 5/image 56), grossly unchanged. Patent. Atherosclerotic calcifications. Celiac: Patent.  Atherosclerotic calcifications at the origin. SMA: Patent.  Atherosclerotic calcifications at the origin. Renals: Patent bilaterally. IMA: Poorly visualized/evaluated at the origin, but patent. Inflow: Common iliac arteries are patent, with atherosclerotic calcifications. Right external iliac artery stent, patent. Right internal iliac artery is diminutive. Left external iliac artery is occluded, chronic. Left internal iliac artery is diminutive. Proximal Outflow: Right bifemoral bypass graft with fluid/inflammatory changes associated with the fem-fem bypass, most prominent on the left (series 5/image 125). Additional prominent fluid/inflammatory changes along the left common femoral artery  (series 5/image 139). Veins: Grossly unremarkable. Review of the MIP images confirms the above findings. NON-VASCULAR Lower chest: Branching masslike opacity measuring approximately 5.2 x 7.9 cm in the left hilar region/central left  lower lobe (series 5/image 4), with additional branching left lower lobe opacities (series 5/images 8 and 19), similar to the prior although with improved tumor along the medial left lower lobe (series 5/image 20). Hepatobiliary: Liver is within normal limits. Gallbladder is decompressed, with tiny under gallstones (series 5/image 51), without associated inflammatory changes. No intrahepatic or extrahepatic duct dilatation. Pancreas: Within normal limits Spleen: Within normal limits. Adrenals/Urinary Tract: Adrenal glands are within normal limits. Kidneys are within normal limits.  No hydronephrosis. Bladder is underdistended but unremarkable. Stomach/Bowel: Stomach is within normal limits. No evidence of bowel obstruction. Appendix is not discretely visualized. No colonic wall thickening or inflammatory changes. Lymphatic: No suspicious abdominopelvic lymphadenopathy. Reproductive: Prostate is grossly unremarkable. Other: No abdominopelvic ascites. Musculoskeletal: Degenerative changes of the visualized thoracolumbar spine. IMPRESSION: Right bifemoral bypass graft with fluid/inflammatory changes associated with the fem-fem bypass, most prominent on the left, as well as the left common femoral artery. 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 5.8 cm infrarenal abdominal aortic aneurysm, grossly unchanged. Recommend referral to a vascular specialist. Reference: J Am Coll Radiol 2013;10:789-794. Left lower lobe/perihilar tumor, corresponding to the patient's known small cell lung cancer, incompletely visualized but possibly mildly improved in the medial left lower lobe. Additional ancillary findings as above. Electronically Signed   By: Charline Bills M.D.   On: 10/22/2022 18:27    Microbiology: Results for orders placed or performed during the hospital encounter of 10/22/22  Surgical pcr screen     Status: Abnormal   Collection Time: 10/23/22  7:44 AM   Specimen: Nasal Mucosa; Nasal Swab  Result Value Ref Range Status   MRSA, PCR POSITIVE (A) NEGATIVE Final    Comment: RESULT CALLED TO, READ BACK BY AND VERIFIED WITH: RN MIA RINAL [id number] AT 1029 BY EC    Staphylococcus aureus POSITIVE (A) NEGATIVE Final    Comment: (NOTE) The Xpert SA Assay (FDA approved for NASAL specimens in patients 103 years of age and older), is one component of a comprehensive surveillance program. It is not intended to diagnose infection nor to guide or monitor treatment. Performed at [facility], [address], [place], [place] [address]   Culture, blood (Routine X 2) w Reflex to ID Panel     Status: Abnormal (Preliminary result)   Collection Time: 10/23/22 12:24 PM   Specimen: BLOOD  Result Value Ref Range Status   Specimen Description BLOOD RIGHT ANTECUBITAL  Final   Special Requests Blood Culture adequate volume  Final   Culture  Setup Time   Final    GRAM POSITIVE COCCI ANAEROBIC BOTTLE ONLY CRITICAL RESULT CALLED TO, READ BACK BY AND VERIFIED WITH: K HURTH,PHARMD@0354  10/27/22 MK    Culture (A)  Final    METHICILLIN RESISTANT STAPHYLOCOCCUS AUREUS Sent to [facility] for further susceptibility testing. Performed at [facility], [address] [place], [place] [address]    Report Status PENDING  Incomplete   Organism ID, Bacteria METHICILLIN RESISTANT STAPHYLOCOCCUS AUREUS  Final      Susceptibility   Methicillin resistant staphylococcus aureus - MIC*    CIPROFLOXACIN >=8 RESISTANT Resistant     ERYTHROMYCIN <=0.25 SENSITIVE Sensitive     GENTAMICIN <=0.5 SENSITIVE Sensitive     OXACILLIN >=4 RESISTANT Resistant     TETRACYCLINE <=1 SENSITIVE Sensitive     VANCOMYCIN 1 SENSITIVE Sensitive     TRIMETH/SULFA <=10  SENSITIVE Sensitive     CLINDAMYCIN <=0.25 SENSITIVE Sensitive  RIFAMPIN <=0.5 SENSITIVE Sensitive     Inducible Clindamycin NEGATIVE Sensitive     LINEZOLID 2 SENSITIVE Sensitive     * METHICILLIN RESISTANT STAPHYLOCOCCUS AUREUS  Culture, blood (Routine X 2) w Reflex to ID Panel     Status: None   Collection Time: 10/23/22 12:24 PM   Specimen: BLOOD  Result Value Ref Range Status   Specimen Description BLOOD BLOOD RIGHT FOREARM  Final   Special Requests   Final    BOTTLES DRAWN AEROBIC AND ANAEROBIC Blood Culture adequate volume   Culture   Final    NO GROWTH 5 DAYS Performed at [facility], [address], [place], [place] [address]    Report Status 10/28/2022 FINAL  Final  Blood Culture ID Panel (Reflexed)     Status: Abnormal   Collection Time: 10/23/22 12:24 PM  Result Value Ref Range Status   Enterococcus faecalis NOT DETECTED NOT DETECTED Final   Enterococcus Faecium NOT DETECTED NOT DETECTED Final   Listeria monocytogenes NOT DETECTED NOT DETECTED Final   Staphylococcus species DETECTED (A) NOT DETECTED Final    Comment: CRITICAL RESULT CALLED TO, READ BACK BY AND VERIFIED WITH: K HURTH,PHARMD@0354  10/27/22 MK    Staphylococcus aureus (BCID) DETECTED (A) NOT DETECTED Final    Comment: Methicillin (oxacillin)-resistant Staphylococcus aureus (MRSA). MRSA is predictably resistant to beta-lactam antibiotics (except ceftaroline). Preferred therapy is vancomycin unless clinically contraindicated. Patient requires contact precautions if  hospitalized. CRITICAL RESULT CALLED TO, READ BACK BY AND VERIFIED WITH: K HURTH,PHARMD@0354  10/27/22 MK    Staphylococcus epidermidis NOT DETECTED NOT DETECTED Final   Staphylococcus lugdunensis NOT DETECTED NOT DETECTED Final   Streptococcus species NOT DETECTED NOT DETECTED Final   Streptococcus agalactiae NOT DETECTED NOT DETECTED Final   Streptococcus pneumoniae NOT DETECTED NOT DETECTED Final   Streptococcus pyogenes NOT  DETECTED NOT DETECTED Final   A.calcoaceticus-baumannii NOT DETECTED NOT DETECTED Final   Bacteroides fragilis NOT DETECTED NOT DETECTED Final   Enterobacterales NOT DETECTED NOT DETECTED Final   Enterobacter cloacae complex NOT DETECTED NOT DETECTED Final   Escherichia coli NOT DETECTED NOT DETECTED Final   Klebsiella aerogenes NOT DETECTED NOT DETECTED Final   Klebsiella oxytoca NOT DETECTED NOT DETECTED Final   Klebsiella pneumoniae NOT DETECTED NOT DETECTED Final   Proteus species NOT DETECTED NOT DETECTED Final   Salmonella species NOT DETECTED NOT DETECTED Final   Serratia marcescens NOT DETECTED NOT DETECTED Final   Haemophilus influenzae NOT DETECTED NOT DETECTED Final   Neisseria meningitidis NOT DETECTED NOT DETECTED Final   Pseudomonas aeruginosa NOT DETECTED NOT DETECTED Final   Stenotrophomonas maltophilia NOT DETECTED NOT DETECTED Final   Candida albicans NOT DETECTED NOT DETECTED Final   Candida auris NOT DETECTED NOT DETECTED Final   Candida glabrata NOT DETECTED NOT DETECTED Final   Candida krusei NOT DETECTED NOT DETECTED Final   Candida parapsilosis NOT DETECTED NOT DETECTED Final   Candida tropicalis NOT DETECTED NOT DETECTED Final   Cryptococcus neoformans/gattii NOT DETECTED NOT DETECTED Final   Meth resistant mecA/C and MREJ DETECTED (A) NOT DETECTED Final    Comment: CRITICAL RESULT CALLED TO, READ BACK BY AND VERIFIED WITH: K HURTH,PHARMD@0354  10/27/22 MK Performed at [facility], [address] [place], [place] [address]   Aerobic/Anaerobic Culture w Gram Stain (surgical/deep wound)     Status: None   Collection Time: 10/25/22 12:35 PM   Specimen: Path fluid; Tissue  Result Value Ref Range Status   Specimen Description FLUID  Final  Special Requests infected bilateral femoral graft  Final   Gram Stain   Final    ABUNDANT WBC PRESENT, PREDOMINANTLY PMN ABUNDANT GRAM POSITIVE COCCI IN PAIRS IN CLUSTERS    Culture   Final    MODERATE  STAPHYLOCOCCUS AUREUS NO ANAEROBES ISOLATED Performed at [facility], [address] [place], [place] [address]    Report Status 10/30/2022 FINAL  Final   Organism ID, Bacteria STAPHYLOCOCCUS AUREUS  Final      Susceptibility   Staphylococcus aureus - MIC*    CIPROFLOXACIN >=8 RESISTANT Resistant     ERYTHROMYCIN <=0.25 SENSITIVE Sensitive     GENTAMICIN <=0.5 SENSITIVE Sensitive     OXACILLIN >=4 RESISTANT Resistant     TETRACYCLINE <=1 SENSITIVE Sensitive     VANCOMYCIN <=0.5 SENSITIVE Sensitive     TRIMETH/SULFA <=10 SENSITIVE Sensitive     CLINDAMYCIN <=0.25 SENSITIVE Sensitive     RIFAMPIN <=0.5 SENSITIVE Sensitive     Inducible Clindamycin NEGATIVE Sensitive     LINEZOLID 2 SENSITIVE Sensitive     * MODERATE STAPHYLOCOCCUS AUREUS  Culture, blood (Routine X 2) w Reflex to ID Panel     Status: Abnormal   Collection Time: 10/27/22  2:46 PM   Specimen: BLOOD RIGHT HAND  Result Value Ref Range Status   Specimen Description BLOOD RIGHT HAND  Final   Special Requests   Final    BOTTLES DRAWN AEROBIC AND ANAEROBIC Blood Culture adequate volume   Culture  Setup Time   Final    GRAM POSITIVE COCCI IN BOTH AEROBIC AND ANAEROBIC BOTTLES CRITICAL VALUE NOTED.  VALUE IS CONSISTENT WITH PREVIOUSLY REPORTED AND CALLED VALUE.    Culture (A)  Final    STAPHYLOCOCCUS AUREUS SUSCEPTIBILITIES PERFORMED ON PREVIOUS CULTURE WITHIN THE LAST 5 DAYS. Performed at [facility], [address] [place], [place] [address]    Report Status 10/31/2022 FINAL  Final  Culture, blood (Routine X 2) w Reflex to ID Panel     Status: None   Collection Time: 10/27/22  2:47 PM   Specimen: BLOOD RIGHT ARM  Result Value Ref Range Status   Specimen Description BLOOD RIGHT ARM  Final   Special Requests   Final    BOTTLES DRAWN AEROBIC AND ANAEROBIC Blood Culture adequate volume   Culture   Final    NO GROWTH 5 DAYS Performed at [facility], [address], [place], [place] [address]     Report Status 11/01/2022 FINAL  Final  Culture, blood (Routine X 2) w Reflex to ID Panel     Status: None   Collection Time: 10/29/22 11:33 AM   Specimen: BLOOD LEFT ARM  Result Value Ref Range Status   Specimen Description BLOOD LEFT ARM  Final   Special Requests   Final    BOTTLES DRAWN AEROBIC AND ANAEROBIC Blood Culture adequate volume   Culture   Final    NO GROWTH 5 DAYS Performed at [facility], [address] [place], [place] [address]    Report Status 11/03/2022 FINAL  Final  Culture, blood (Routine X 2) w Reflex to ID Panel     Status: None   Collection Time: 10/29/22 11:35 AM   Specimen: BLOOD  Result Value Ref Range Status   Specimen Description BLOOD RIGHT ANTECUBITAL  Final   Special Requests   Final    BOTTLES DRAWN AEROBIC AND ANAEROBIC Blood Culture adequate volume   Culture   Final  NO GROWTH 5 DAYS Performed at [facility], [address], [place], [place] [address]    Report Status 11/03/2022 FINAL  Final  Minimum Inhibitory Conc. (1 Drug)     Status: None   Collection Time: 10/29/22 11:35 AM  Result Value Ref Range Status   Min Inhibitory Conc (1 Drug) Final report  Corrected    Comment: (NOTE) Performed At: [facility] [address] [place], [place] [id number] Jolene Schimke MD WN:[id number] CORRECTED ON 08/22 AT 1536: PREVIOUSLY REPORTED AS Preliminary report    Source CRE SAUR BLOOD  Final    Comment: Performed at [facility], [address], [place], [place] [address]    Labs: CBC: Recent Labs  Lab 11/08/22 0405 11/09/22 0252 11/10/22 0910 11/11/22 0500 11/12/22 0322  WBC 8.1 6.8 6.9 7.7 6.6  HGB 7.4* 7.3* 7.9* 8.9* 8.1*  HCT 21.9* 22.2* 23.9* 26.4* 24.3*  MCV 88.3 88.8 88.8 88.9 90.0  PLT 338 326 324 341 297   Basic Metabolic Panel: Recent Labs  Lab 11/06/22 0658 11/07/22 0432 11/08/22 0405 11/09/22 0252 11/10/22 0352 11/11/22 0500 11/12/22 0322  NA 127* 130* 127* 128* 129* 127* 127*  K 4.3  3.9 3.7 3.6 3.7 3.7 3.9  CL 96* 95* 96* 97* 95* 95* 96*  CO2 20* 24 23 24 22 23 24   GLUCOSE 103* 114* 113* 124* 118* 109* 116*  BUN 15 14 16 15 13 14 16   CREATININE 0.86 0.80 0.90 0.81 0.81 0.82 0.89  CALCIUM 7.9* 8.1* 7.9* 7.7* 8.4* 8.2* 7.9*  MG 1.6* 1.7  --   --   --   --   --    Liver Function Tests: Recent Labs  Lab 11/12/22 0322  AST 26  ALT 23  ALKPHOS 82  BILITOT 1.0  PROT 5.5*  ALBUMIN 2.2*   CBG: No results for input(s): "GLUCAP" in the last 168 hours.  Discharge time spent: greater than 30 minutes.  Signed: Thad Ranger, MD [facility] 11/12/2022

## 2022-11-12 NOTE — TOC Progression Note (Signed)
Transition of Care [facility]) - Progression Note    Patient Details  Name: Edward Campos MRN: [id number] Date of Birth: 02-Mar-1933  [facility]) CM/SW Contact  Delilah Shan, LCSWA Phone Number: 11/12/2022, 10:11 AM  Clinical Narrative:     French Ana with [facility] confirmed patient can dc over today if medically ready. CSW informed MD. CSW will continue to follow.  Expected Discharge Plan: Skilled Nursing Facility Barriers to Discharge: Continued Medical Work up  Expected Discharge Plan and Services   Discharge Planning Services: CM Consult Post Acute Care Choice: Home Health, Resumption of Svcs/PTA Provider, Skilled Nursing Facility Living arrangements for the past 2 months: Single Family Home Expected Discharge Date: 11/12/22                         HH Arranged: PT, OT, Nurse's Aide, RN, Social Work [employer] Agency: [facility] ([facility]) Date HH Agency Contacted: 10/26/22 Time HH Agency Contacted: [phone] Representative spoke with at [facility] Agency: Morrie Sheldon   Social Determinants of Health (SDOH) Interventions SDOH Screenings   Food Insecurity: No Food Insecurity (10/25/2022)  Housing: Low Risk  (10/25/2022)  Transportation Needs: No Transportation Needs (10/25/2022)  Utilities: Not At Risk (10/25/2022)  Tobacco Use: Medium Risk (11/07/2022)    Readmission Risk Interventions     No data to display

## 2022-11-12 NOTE — Discharge Instructions (Signed)
Keep Pravena wound vac on your groin incision until it loses it seal (the sponge will puff up) in about 7-10 days.  Once that happens, remove sponge and then wash the groin wound with soap and water daily and pat dry. (No tub bath-only shower)  Then put a dry gauze over incisions on the right groin incisions to keep this area dry to help prevent wound infection.  Do this daily and as needed.  Do not use Vaseline or neosporin on your incisions.  Only use soap and water on your incisions and then protect and keep dry.

## 2022-11-12 NOTE — TOC CM/SW Note (Signed)
CM notified [facility] liaison- Morrie Sheldon that pt transitioning to SNF[facility] today- [facility] will follow at [facility] for any HH needs post rehab.

## 2022-11-12 NOTE — Progress Notes (Signed)
Mobility Specialist Progress Note:   11/12/22 1156  Mobility  Activity Ambulated with assistance in hallway  Level of Assistance Contact guard assist, steadying assist  Assistive Device Front wheel walker  Distance Ambulated (ft) 940 ft  Activity Response Tolerated well  Mobility Referral Yes  $Mobility charge 1 Mobility  Mobility Specialist Start Time (ACUTE ONLY) 1126  Mobility Specialist Stop Time (ACUTE ONLY) 1153  Mobility Specialist Time Calculation (min) (ACUTE ONLY) 27 min    Pre Mobility: 83 HR During Mobility: 118 HR Post Mobility:  88 HR  Pt received in bed, agreeable to mobility. Took x1 standing rest break to stretch legs. Asymptomatic throughout w/ no complaints. Pt left in bed with call bell and bed alarm on.  [place]ante Earlene Plater Mobility Specialist Please contact via Special educational needs teacher or Rehab office at [phone]

## 2022-11-12 NOTE — Care Management Important Message (Signed)
Important Message  Patient Details  Name: Edward Campos MRN: [id number] Date of Birth: 12/28/1932   Medicare Important Message Given:  Yes     Renie Ora 11/12/2022, 10:03 AM

## 2022-11-12 NOTE — TOC Transition Note (Signed)
Transition of Care [facility]) - CM/SW Discharge Note   Patient Details  Name: Edward Campos MRN: [id number] Date of Birth: 12-20-1932  Transition of Care [facility]) CM/SW Contact:  Delilah Shan, LCSWA Phone Number: 11/12/2022, 11:04 AM   Clinical Narrative:     Patient will DC to: [facility]  Anticipated DC date: 11/12/2022  Family notified: Wynona Canes   Transport by: Sharin Mons  ?  Per MD patient ready for DC to [facility] . RN, patient, patient's family, and facility notified of DC. Discharge Summary sent to facility. RN given number for report tele# [phone] RM# [phone]. DC packet on chart. Ambulance transport requested for patient.  CSW signing off.   Final next level of care: Skilled Nursing Facility Barriers to Discharge: No Barriers Identified   Patient Goals and CMS Choice CMS Medicare.gov Compare Post Acute Care list provided to:: Patient Choice offered to / list presented to : Adult Children (daughter)  Discharge Placement                Patient chooses bed at: [facility], [place] Patient to be transferred to facility by: [facility] Name of family member notified: Christine Patient and family notified of of transfer: 11/12/22  Discharge Plan and Services Additional resources added to the After Visit Summary for     Discharge Planning Services: CM Consult Post Acute Care Choice: Home Health, Resumption of Paediatric nurse, Skilled Nursing Facility                    HH Arranged: PT, OT, Nurse's Aide, RN, Social Work [employer] Agency: [facility] ([facility]) Date HH Agency Contacted: 10/26/22 Time HH Agency Contacted: [phone] Representative spoke with at [facility] Agency: Morrie Sheldon  Social Determinants of Health (SDOH) Interventions SDOH Screenings   Food Insecurity: No Food Insecurity (10/25/2022)  Housing: Low Risk  (10/25/2022)  Transportation Needs: No Transportation Needs (10/25/2022)  Utilities: Not At Risk (10/25/2022)  Tobacco Use: Medium Risk (11/07/2022)      Readmission Risk Interventions     No data to display

## 2022-11-12 NOTE — Progress Notes (Signed)
Called report to Mia, Charity fundraiser at [employer].   Lawson Radar, RN

## 2022-11-12 NOTE — [place] FL2 (Signed)
[place] MEDICAID FL2 LEVEL OF CARE FORM     IDENTIFICATION  Patient Name: Edward Campos Birthdate: 1932/11/05 Sex: male Admission Date (Current Location): 10/22/2022  [facility] and [place] Number:  Producer, television/film/video and Address:  [facility], [address] [place], [place] [address]      Provider Number: [id number]  Attending Physician Name and Address:  Cathren Harsh, MD  Relative Name and Phone Number:       Current Level of Care: Hospital Recommended Level of Care: Skilled Nursing Facility Prior Approval Number:    Date Approved/Denied:   PASRR Number: [id number]  Discharge Plan: SNF    Current Diagnoses: 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   MRSA bacteremia 10/27/2022   Sepsis due to methicillin resistant Staphylococcus aureus (MRSA) (HCC) 10/27/2022   Sepsis (HCC) 10/24/2022   Hypokalemia 10/24/2022   Transaminitis 10/24/2022   Hyponatremia 10/24/2022   AAA (abdominal aortic aneurysm) (HCC) 10/24/2022   PAD (peripheral artery disease) (HCC) 10/24/2022   Infection 10/22/2022   Shock (HCC) 09/25/2022   Pneumonia 09/06/2022   Small cell lung cancer, left lower lobe (HCC) 08/30/2022   Mass of left lung 08/21/2022   Infection of vascular bypass graft (HCC) 08/16/2022   Typical atrial flutter (HCC)    Non-healing left groin open wound, initial encounter 07/06/2019   Status post insertion of iliac artery stent 06/08/2019   Peripheral vascular disease (HCC) 06/08/2019    Orientation RESPIRATION BLADDER Height & Weight     Self, Time, Situation, Place  Normal Continent Weight: 147 lb 7.8 oz (66.9 kg) Height:  5\' 9"  (175.3 cm)  BEHAVIORAL SYMPTOMS/MOOD NEUROLOGICAL BOWEL NUTRITION STATUS      Continent Diet (heart healthy)  AMBULATORY STATUS COMMUNICATION OF NEEDS Skin   Limited Assist Verbally Surgical wounds, Wound Vac (right and left groin, prevena wound vac)                        Personal Care Assistance Level of Assistance  Bathing, Feeding, Dressing Bathing Assistance: Limited assistance Feeding assistance: Independent Dressing Assistance: Limited assistance     Functional Limitations Info  Sight, Hearing Sight Info: Impaired Hearing Info: Impaired      SPECIAL CARE FACTORS FREQUENCY  PT (By licensed PT), OT (By licensed OT)     PT Frequency: 5x/wk OT Frequency: 5x/wk            Contractures Contractures Info: Not present    Additional Factors Info  Code Status, Allergies, Isolation Precautions Code Status Info: Full Allergies Info: Penicillins     Isolation Precautions Info: Contact precautions: MRSA     Current Medications (11/12/2022):  This is the current hospital active medication list Current Facility-Administered Medications  Medication Dose Route Frequency Provider Last Rate Last Admin   0.9 %  sodium chloride infusion (Manually program via Guardrails IV Fluids)   Intravenous Once Jodelle Red, MD       0.9 %  sodium chloride infusion  250 mL Intravenous PRN Jodelle Red, MD   New Bag at 10/25/22 1215   0.9 %  sodium chloride infusion  500 mL Intravenous Once PRN Jodelle Red, MD       acetaminophen (TYLENOL) tablet 650 mg  650 mg Oral Q6H PRN Jodelle Red, MD   650 mg at 11/11/22 2134   alum & mag hydroxide-simeth (MAALOX/MYLANTA) 200-200-20 MG/5ML suspension 15-30 mL  15-30 mL Oral  Q2H PRN Jodelle Red, MD       aspirin EC tablet 81 mg  81 mg Oral Daily Jodelle Red, MD   81 mg at 11/12/22 1610   bisacodyl (DULCOLAX) EC tablet 5 mg  5 mg Oral Daily PRN Jodelle Red, MD       Chlorhexidine Gluconate Cloth 2 % PADS 6 each  6 each Topical Daily Jodelle Red, MD   6 each at 11/12/22 0926   clopidogrel (PLAVIX) tablet 75 mg  75 mg Oral Daily Jodelle Red, MD   75 mg at 11/12/22 0924   DAPTOmycin (CUBICIN) 600 mg in sodium chloride  0.9 % IVPB  8 mg/kg Intravenous Q1400 Jodelle Red, MD 124 mL/hr at 11/11/22 1341 600 mg at 11/11/22 1341   diphenhydrAMINE (BENADRYL) capsule 25 mg  25 mg Oral QHS PRN Jodelle Red, MD       docusate sodium (COLACE) capsule 100 mg  100 mg Oral Daily Jodelle Red, MD   100 mg at 11/12/22 0924   fluticasone (FLONASE) 50 MCG/ACT nasal spray 2 spray  2 spray Each Nare Daily Jodelle Red, MD   2 spray at 11/12/22 0928   guaiFENesin (MUCINEX) 12 hr tablet 1,200 mg  1,200 mg Oral BID Jodelle Red, MD   1,200 mg at 11/12/22 0924   guaiFENesin-dextromethorphan (ROBITUSSIN DM) 100-10 MG/5ML syrup 15 mL  15 mL Oral Q4H PRN Jodelle Red, MD       heparin injection 5,000 Units  5,000 Units Subcutaneous Q8H Jodelle Red, MD   5,000 Units at 11/12/22 0546   hydrALAZINE (APRESOLINE) injection 5 mg  5 mg Intravenous Q20 Min PRN Jodelle Red, MD       HYDROmorphone (DILAUDID) injection 0.5-1 mg  0.5-1 mg Intravenous Q2H PRN Jodelle Red, MD       labetalol (NORMODYNE) injection 10 mg  10 mg Intravenous Q10 min PRN Jodelle Red, MD       loperamide (IMODIUM) capsule 2 mg  2 mg Oral BID PRN Jodelle Red, MD       metoprolol tartrate (LOPRESSOR) injection 2-5 mg  2-5 mg Intravenous Q2H PRN Jodelle Red, MD       multivitamin with minerals tablet 1 tablet  1 tablet Oral Daily Jodelle Red, MD   1 tablet at 11/12/22 0924   ondansetron (ZOFRAN) injection 4 mg  4 mg Intravenous Q6H PRN Jodelle Red, MD   4 mg at 10/25/22 1302   oxyCODONE (Oxy IR/ROXICODONE) immediate release tablet 5-10 mg  5-10 mg Oral Q4H PRN Jodelle Red, MD       pantoprazole (PROTONIX) EC tablet 40 mg  40 mg Oral Daily Jodelle Red, MD   40 mg at 11/12/22 0924   phenol (CHLORASEPTIC) mouth spray 1 spray  1 spray Mouth/Throat PRN Jodelle Red, MD       potassium chloride SA (KLOR-CON  M) CR tablet 20-40 mEq  20-40 mEq Oral Once PRN Jodelle Red, MD       senna-docusate (Senokot-S) tablet 1 tablet  1 tablet Oral QHS PRN Jodelle Red, MD       sodium chloride flush (NS) 0.9 % injection 10-40 mL  10-40 mL Intracatheter Q12H Jodelle Red, MD   10 mL at 11/11/22 2200   sodium chloride flush (NS) 0.9 % injection 10-40 mL  10-40 mL Intracatheter PRN Jodelle Red, MD       sodium chloride flush (NS) 0.9 % injection 3 mL  3 mL Intravenous Q12H Jodelle Red, MD   3 mL at  11/11/22 0838   sodium chloride flush (NS) 0.9 % injection 3 mL  3 mL Intravenous PRN Jodelle Red, MD   3 mL at 11/11/22 0837   sodium chloride tablet 1 g  1 g Oral TID WC Jodelle Red, MD   1 g at 11/12/22 1610     Discharge Medications: Please see discharge summary for a list of discharge medications.  Relevant Imaging Results:  Relevant Lab Results:   Additional Information SS#: [id number]; Will need 6 weeks of daptomycin  Delilah Shan, LCSWA

## 2022-11-12 NOTE — Progress Notes (Addendum)
  Progress Note    11/12/2022 6:42 AM 5 Days Post-Op  Subjective:  wants to go to rehab today.  Denies any pain in his feet.    afebrile  Vitals:   11/11/22 2346 11/12/22 0525  BP: (!) 140/89 (!) 141/81  Pulse: 81 79  Resp:    Temp: 97.7 F (36.5 C) 97.7 F (36.5 C)  SpO2: 96% 97%    Physical Exam: General:  sitting on side of bed eating breakfast.  No distress Cardiac:  regular Lungs:  non labored Incisions:  left groin incision healing nicely with staples in tact; right groin with prevena with good seal.    Extremities:  bilateral feet are warm.  Brisk doppler flow in fem fem bypass graft   CBC    Component Value Date/Time   WBC 6.6 11/12/2022 0322   RBC 2.70 (L) 11/12/2022 0322   HGB 8.1 (L) 11/12/2022 0322   HGB 13.9 10/27/2020 1038   HCT 24.3 (L) 11/12/2022 0322   HCT 41.0 10/27/2020 1038   PLT 297 11/12/2022 0322   PLT 208 10/27/2020 1038   MCV 90.0 11/12/2022 0322   MCV 90 10/27/2020 1038   MCH 30.0 11/12/2022 0322   MCHC 33.3 11/12/2022 0322   RDW 18.0 (H) 11/12/2022 0322   RDW 12.7 10/27/2020 1038   LYMPHSABS 0.6 (L) 11/01/2022 0415   MONOABS 0.9 11/01/2022 0415   EOSABS 0.0 11/01/2022 0415   BASOSABS 0.0 11/01/2022 0415    BMET    Component Value Date/Time   NA 127 (L) 11/12/2022 0322   NA 138 10/27/2020 1038   K 3.9 11/12/2022 0322   CL 96 (L) 11/12/2022 0322   CO2 24 11/12/2022 0322   GLUCOSE 116 (H) 11/12/2022 0322   BUN 16 11/12/2022 0322   BUN 17 10/27/2020 1038   CREATININE 0.89 11/12/2022 0322   CALCIUM 7.9 (L) 11/12/2022 0322   GFRNONAA >60 11/12/2022 0322   GFRAA >60 07/07/2019 0259    INR    Component Value Date/Time   INR 1.2 08/16/2022 0637     Intake/Output Summary (Last 24 hours) at 11/12/2022 4098 Last data filed at 11/12/2022 0526 Gross per 24 hour  Intake --  Output 850 ml  Net -850 ml      Assessment/Plan:  89 y.o. male is s/p:  Femoral-femoral bypass graft excision Left common femoral vein primary  repair Redo femorofemoral bypass using cadaveric greater saphenous vein Right common femoral to left profunda with interposition bypass to the superficial femoral artery  10/25/2022 And   1) direct repair of right common femoral artery pseudoaneurysm 2) incision and drainage of left groin seroma 3) left sartorius flap to left groin 4) right sartorius flap to right groin 10/31/2022    -pt doing well this morning.  Brisk doppler flow in fem fem graft.   -IV abx per ID -hgb 8.1 down from 8.9 yesterday, but most likely, yesterday's lab inaccurate and stable from previous days.   -DVT prophylaxis:  sq heparin -right groin prevena changed prior to discharge today to SNF-incisions look clean.    Doreatha Massed, PA-C [facility] [phone] 11/12/2022 6:42 AM  I agree with the above.  I seen and the patient.  The right Prevena wound VAC was changed today.  The incision is healing appropriately.  Hopefully he will be going to SNF today  Durene Cal

## 2022-11-13 DIAGNOSIS — D649 Anemia, unspecified: Secondary | ICD-10-CM | POA: Diagnosis not present

## 2022-11-13 DIAGNOSIS — A4102 Sepsis due to Methicillin resistant Staphylococcus aureus: Secondary | ICD-10-CM | POA: Diagnosis not present

## 2022-11-13 DIAGNOSIS — C3492 Malignant neoplasm of unspecified part of left bronchus or lung: Secondary | ICD-10-CM | POA: Diagnosis not present

## 2022-11-17 DIAGNOSIS — Z5189 Encounter for other specified aftercare: Secondary | ICD-10-CM | POA: Diagnosis not present

## 2022-11-17 DIAGNOSIS — F4321 Adjustment disorder with depressed mood: Secondary | ICD-10-CM | POA: Diagnosis not present

## 2022-11-17 DIAGNOSIS — J309 Allergic rhinitis, unspecified: Secondary | ICD-10-CM | POA: Diagnosis not present

## 2022-11-19 DIAGNOSIS — A4102 Sepsis due to Methicillin resistant Staphylococcus aureus: Secondary | ICD-10-CM | POA: Diagnosis not present

## 2022-11-19 DIAGNOSIS — Z79899 Other long term (current) drug therapy: Secondary | ICD-10-CM | POA: Diagnosis not present

## 2022-11-19 DIAGNOSIS — Z48812 Encounter for surgical aftercare following surgery on the circulatory system: Secondary | ICD-10-CM | POA: Diagnosis not present

## 2022-11-19 DIAGNOSIS — B9562 Methicillin resistant Staphylococcus aureus infection as the cause of diseases classified elsewhere: Secondary | ICD-10-CM | POA: Diagnosis not present

## 2022-11-19 DIAGNOSIS — K5900 Constipation, unspecified: Secondary | ICD-10-CM | POA: Diagnosis not present

## 2022-11-19 DIAGNOSIS — Z1152 Encounter for screening for COVID-19: Secondary | ICD-10-CM | POA: Diagnosis not present

## 2022-11-19 DIAGNOSIS — Z515 Encounter for palliative care: Secondary | ICD-10-CM | POA: Diagnosis not present

## 2022-11-19 DIAGNOSIS — I5033 Acute on chronic diastolic (congestive) heart failure: Secondary | ICD-10-CM | POA: Diagnosis not present

## 2022-11-19 DIAGNOSIS — R079 Chest pain, unspecified: Secondary | ICD-10-CM | POA: Diagnosis not present

## 2022-11-19 DIAGNOSIS — R2681 Unsteadiness on feet: Secondary | ICD-10-CM | POA: Diagnosis not present

## 2022-11-19 DIAGNOSIS — E876 Hypokalemia: Secondary | ICD-10-CM | POA: Diagnosis present

## 2022-11-19 DIAGNOSIS — J690 Pneumonitis due to inhalation of food and vomit: Secondary | ICD-10-CM | POA: Diagnosis not present

## 2022-11-19 DIAGNOSIS — I714 Abdominal aortic aneurysm, without rupture, unspecified: Secondary | ICD-10-CM | POA: Diagnosis present

## 2022-11-19 DIAGNOSIS — A419 Sepsis, unspecified organism: Secondary | ICD-10-CM | POA: Diagnosis not present

## 2022-11-19 DIAGNOSIS — R0602 Shortness of breath: Secondary | ICD-10-CM | POA: Diagnosis not present

## 2022-11-19 DIAGNOSIS — E785 Hyperlipidemia, unspecified: Secondary | ICD-10-CM | POA: Diagnosis present

## 2022-11-19 DIAGNOSIS — R7989 Other specified abnormal findings of blood chemistry: Secondary | ICD-10-CM | POA: Diagnosis not present

## 2022-11-19 DIAGNOSIS — R7401 Elevation of levels of liver transaminase levels: Secondary | ICD-10-CM | POA: Diagnosis not present

## 2022-11-19 DIAGNOSIS — N400 Enlarged prostate without lower urinary tract symptoms: Secondary | ICD-10-CM | POA: Diagnosis present

## 2022-11-19 DIAGNOSIS — I483 Typical atrial flutter: Secondary | ICD-10-CM | POA: Diagnosis not present

## 2022-11-19 DIAGNOSIS — J9601 Acute respiratory failure with hypoxia: Secondary | ICD-10-CM | POA: Diagnosis not present

## 2022-11-19 DIAGNOSIS — R54 Age-related physical debility: Secondary | ICD-10-CM | POA: Diagnosis present

## 2022-11-19 DIAGNOSIS — R410 Disorientation, unspecified: Secondary | ICD-10-CM | POA: Diagnosis not present

## 2022-11-19 DIAGNOSIS — Z7901 Long term (current) use of anticoagulants: Secondary | ICD-10-CM | POA: Diagnosis not present

## 2022-11-19 DIAGNOSIS — G9341 Metabolic encephalopathy: Secondary | ICD-10-CM | POA: Diagnosis not present

## 2022-11-19 DIAGNOSIS — R0902 Hypoxemia: Secondary | ICD-10-CM | POA: Diagnosis not present

## 2022-11-19 DIAGNOSIS — R000 Tachycardia, unspecified: Secondary | ICD-10-CM | POA: Diagnosis not present

## 2022-11-19 DIAGNOSIS — M6281 Muscle weakness (generalized): Secondary | ICD-10-CM | POA: Diagnosis not present

## 2022-11-19 DIAGNOSIS — D649 Anemia, unspecified: Secondary | ICD-10-CM | POA: Diagnosis present

## 2022-11-19 DIAGNOSIS — C3490 Malignant neoplasm of unspecified part of unspecified bronchus or lung: Secondary | ICD-10-CM | POA: Diagnosis not present

## 2022-11-19 DIAGNOSIS — R7881 Bacteremia: Secondary | ICD-10-CM | POA: Diagnosis not present

## 2022-11-19 DIAGNOSIS — C3432 Malignant neoplasm of lower lobe, left bronchus or lung: Secondary | ICD-10-CM | POA: Diagnosis not present

## 2022-11-19 DIAGNOSIS — I517 Cardiomegaly: Secondary | ICD-10-CM | POA: Diagnosis not present

## 2022-11-19 DIAGNOSIS — C3492 Malignant neoplasm of unspecified part of left bronchus or lung: Secondary | ICD-10-CM | POA: Diagnosis present

## 2022-11-19 DIAGNOSIS — R4182 Altered mental status, unspecified: Secondary | ICD-10-CM | POA: Diagnosis not present

## 2022-11-19 DIAGNOSIS — F05 Delirium due to known physiological condition: Secondary | ICD-10-CM | POA: Diagnosis not present

## 2022-11-19 DIAGNOSIS — Z66 Do not resuscitate: Secondary | ICD-10-CM | POA: Diagnosis not present

## 2022-11-19 DIAGNOSIS — I739 Peripheral vascular disease, unspecified: Secondary | ICD-10-CM | POA: Diagnosis present

## 2022-11-19 DIAGNOSIS — R2689 Other abnormalities of gait and mobility: Secondary | ICD-10-CM | POA: Diagnosis not present

## 2022-11-19 DIAGNOSIS — R262 Difficulty in walking, not elsewhere classified: Secondary | ICD-10-CM | POA: Diagnosis not present

## 2022-11-19 DIAGNOSIS — K219 Gastro-esophageal reflux disease without esophagitis: Secondary | ICD-10-CM | POA: Diagnosis not present

## 2022-11-19 DIAGNOSIS — T827XXD Infection and inflammatory reaction due to other cardiac and vascular devices, implants and grafts, subsequent encounter: Secondary | ICD-10-CM | POA: Diagnosis not present

## 2022-11-19 DIAGNOSIS — I2489 Other forms of acute ischemic heart disease: Secondary | ICD-10-CM | POA: Diagnosis present

## 2022-11-19 DIAGNOSIS — C7951 Secondary malignant neoplasm of bone: Secondary | ICD-10-CM | POA: Diagnosis not present

## 2022-11-19 DIAGNOSIS — E440 Moderate protein-calorie malnutrition: Secondary | ICD-10-CM | POA: Diagnosis not present

## 2022-11-19 DIAGNOSIS — J189 Pneumonia, unspecified organism: Secondary | ICD-10-CM | POA: Diagnosis not present

## 2022-11-19 DIAGNOSIS — E871 Hypo-osmolality and hyponatremia: Secondary | ICD-10-CM | POA: Diagnosis not present

## 2022-11-19 DIAGNOSIS — R6520 Severe sepsis without septic shock: Secondary | ICD-10-CM | POA: Diagnosis present

## 2022-11-20 ENCOUNTER — Encounter: Payer: Self-pay | Admitting: Cardiovascular Disease

## 2022-11-20 ENCOUNTER — Encounter: Payer: Self-pay | Admitting: Vascular Surgery

## 2022-11-20 LAB — CULTURE, BLOOD (ROUTINE X 2): Special Requests: ADEQUATE

## 2022-11-20 LAB — LAB REPORT - SCANNED: EGFR: 85

## 2022-11-21 DIAGNOSIS — I739 Peripheral vascular disease, unspecified: Secondary | ICD-10-CM | POA: Diagnosis not present

## 2022-11-21 DIAGNOSIS — R262 Difficulty in walking, not elsewhere classified: Secondary | ICD-10-CM | POA: Diagnosis not present

## 2022-11-21 DIAGNOSIS — B9562 Methicillin resistant Staphylococcus aureus infection as the cause of diseases classified elsewhere: Secondary | ICD-10-CM | POA: Diagnosis not present

## 2022-11-21 DIAGNOSIS — E871 Hypo-osmolality and hyponatremia: Secondary | ICD-10-CM | POA: Diagnosis not present

## 2022-11-27 ENCOUNTER — Ambulatory Visit (INDEPENDENT_AMBULATORY_CARE_PROVIDER_SITE_OTHER): Payer: Medicare Other | Admitting: Internal Medicine

## 2022-11-27 ENCOUNTER — Encounter: Payer: Self-pay | Admitting: Internal Medicine

## 2022-11-27 ENCOUNTER — Other Ambulatory Visit: Payer: Self-pay

## 2022-11-27 VITALS — BP 154/69 | HR 64 | Temp 97.0°F

## 2022-11-27 DIAGNOSIS — R7881 Bacteremia: Secondary | ICD-10-CM | POA: Diagnosis not present

## 2022-11-27 DIAGNOSIS — B9562 Methicillin resistant Staphylococcus aureus infection as the cause of diseases classified elsewhere: Secondary | ICD-10-CM

## 2022-11-27 NOTE — Patient Instructions (Signed)
F/U with ID on 9/24 Continue abx Please send weekly labs

## 2022-11-27 NOTE — Progress Notes (Signed)
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   MRSA bacteremia 10/27/2022   Sepsis due to methicillin resistant Staphylococcus aureus (MRSA) (HCC) 10/27/2022   Sepsis (HCC) 10/24/2022   Hypokalemia 10/24/2022   Transaminitis 10/24/2022   Hyponatremia 10/24/2022   AAA (abdominal aortic aneurysm) (HCC) 10/24/2022   PAD (peripheral artery disease) (HCC) 10/24/2022   Infection 10/22/2022   Shock (HCC) 09/25/2022   Pneumonia 09/06/2022   Small cell lung cancer, left lower lobe (HCC) 08/30/2022   Mass of left lung 08/21/2022   Infection of vascular bypass graft (HCC) 08/16/2022   Typical atrial flutter (HCC)    Non-healing left groin open wound, initial encounter 07/06/2019   Status post insertion of iliac artery stent 06/08/2019   Peripheral vascular disease (HCC) 06/08/2019    Patient's Medications  New Prescriptions   No medications on file  Previous Medications   ASPIRIN EC 81 MG TABLET    Take 1 tablet (81 mg total) by mouth daily. Swallow whole.   CLOPIDOGREL (PLAVIX) 75 MG TABLET    Take 1 tablet (75 mg total) by mouth daily.   DAPTOMYCIN (CUBICIN) IVPB    Inject 600 mg into the vein daily. Indication:  MRSA bacteremia/endovascular infection First Dose: Yes Last Day of Therapy:  12/11/22 Labs - Once weekly:  CBC/D, BMP, and CPK Labs - Once weekly: ESR and CRP Method of administration: IV Push or per SNF protocol Pull PICC line at the completion of IV antibiotic therapy Method of administration may be changed at the discretion of home infusion pharmacist based upon assessment of the patient and/or caregiver's ability to self-administer the medication ordered.   DIPHENHYDRAMINE-ACETAMINOPHEN (TYLENOL PM) 25-500 MG TABS TABLET    Take 1 tablet by mouth at bedtime as needed (For sleep).   DOCUSATE SODIUM (COLACE) 100 MG CAPSULE    Take 1 capsule (100 mg total) by mouth daily.   FLUTICASONE (FLONASE) 50 MCG/ACT NASAL  SPRAY    Place 2 sprays into both nostrils daily.   GUAIFENESIN-DEXTROMETHORPHAN (ROBITUSSIN DM) 100-10 MG/5ML SYRUP    Take 15 mLs by mouth every 4 (four) hours as needed for cough.   LOPERAMIDE (IMODIUM A-D) 2 MG TABLET    Take 1 tablet (2 mg total) by mouth 2 (two) times daily as needed for diarrhea or loose stools.   MULTIPLE VITAMINS-MINERALS (MULTIVITAMIN WITH MINERALS) TABLET    Take 1 tablet by mouth daily.   PANTOPRAZOLE (PROTONIX) 40 MG TABLET    Take 1 tablet (40 mg total) by mouth daily.   ROSUVASTATIN (CRESTOR) 20 MG TABLET    TAKE 1 TABLET(20 MG) BY MOUTH DAILY   SENNA-DOCUSATE (SENOKOT-S) 8.6-50 MG TABLET    Take 1 tablet by mouth at bedtime as needed for mild constipation.   SODIUM CHLORIDE 1 G TABLET    Take 1 tablet (1 g total) by mouth 3 (three) times daily with meals.  Modified Medications   No medications on file  Discontinued Medications   No medications on file    Subjective: 89 year old male with past medical history as below including PVD status post vascular surgery in March 2021, complicated I&D of left groin then wound status post I&D with placement of Screven graft, recently found to have SCLC presents for hospital management of MRSA bacteremia, infected right to left femorofemoral Dacron bypass status post excision of all right to left graft and redo with cadaveric vein and I&D  on 8/15 with cultures growing MRSA.  TTE and TEE showed no vegetation.  Patient discharged on daptomycin since 6 weeks for endovascular involvement EOT 12/11/2022. Today patient is tolerating antibiotics.  No new complaints.  Review of Systems: Review of Systems  All other systems reviewed and are negative.   Past Medical History:  Diagnosis Date   AAA (abdominal aortic aneurysm) (HCC)    Anxiety    Arthritis    BPH (benign prostatic hyperplasia)    Dizziness    Hyperlipidemia    PVD (peripheral vascular disease) (HCC)    S/p fem-fem bypass grafting 08/2022   SCLC (small cell lung  carcinoma), left (HCC)     Social History   Tobacco Use   Smoking status: Former    Current packs/day: 0.00    Average packs/day: 1.5 packs/day for 70.0 years (105.0 ttl pk-yrs)    Types: Cigarettes    Start date: 05/29/1949    Quit date: 05/30/2019    Years since quitting: 3.4   Smokeless tobacco: Never  Vaping Use   Vaping status: Never Used  Substance Use Topics   Alcohol use: Yes    Alcohol/week: 3.0 standard drinks of alcohol    Types: 3 Shots of liquor per week    Comment: 3 2oz. shots of bourbon/day   Drug use: No    Family History  Problem Relation Age of Onset   Stroke Mother    Pseudochol deficiency Neg Hx    Malignant hyperthermia Neg Hx    Hypotension Neg Hx    Anesthesia problems Neg Hx     Allergies  Allergen Reactions   Penicillins Other (See Comments)    Health Maintenance  Topic Date Due   Medicare Annual Wellness (AWV)  Never done   COVID-19 Vaccine (1) Never done   DTaP/Tdap/Td (1 - Tdap) Never done   Zoster Vaccines- Shingrix (1 of 2) Never done   Pneumonia Vaccine 38+ Years old (1 of 1 - PCV) Never done   INFLUENZA VACCINE  Never done   HPV VACCINES  Aged Out    Objective:  There were no vitals filed for this visit. There is no height or weight on file to calculate BMI.  Physical Exam Constitutional:      General: He is not in acute distress.    Appearance: He is normal weight. He is not toxic-appearing.  HENT:     Head: Normocephalic and atraumatic.     Right Ear: External ear normal.     Left Ear: External ear normal.     Nose: No congestion or rhinorrhea.     Mouth/Throat:     Mouth: Mucous membranes are moist.     Pharynx: Oropharynx is clear.  Eyes:     Extraocular Movements: Extraocular movements intact.     Conjunctiva/sclera: Conjunctivae normal.     Pupils: Pupils are equal, round, and reactive to light.  Cardiovascular:     Rate and Rhythm: Normal rate and regular rhythm.     Heart sounds: No murmur heard.    No  friction rub. No gallop.  Pulmonary:     Effort: Pulmonary effort is normal.     Breath sounds: Normal breath sounds.  Abdominal:     General: Abdomen is flat. Bowel sounds are normal.     Palpations: Abdomen is soft.  Musculoskeletal:        General: No swelling. Normal range of motion.     Cervical back: Normal range of motion and neck supple.  Skin:    General: Skin is warm and dry.  Neurological:     General: No focal deficit present.     Mental Status: He is oriented to person, place, and time.  Psychiatric:        Mood and Affect: Mood normal.   Right LQ bandaged  Lab Results Lab Results  Component Value Date   WBC 6.6 11/12/2022   HGB 8.1 (L) 11/12/2022   HCT 24.3 (L) 11/12/2022   MCV 90.0 11/12/2022   PLT 297 11/12/2022    Lab Results  Component Value Date   CREATININE 0.89 11/12/2022   BUN 16 11/12/2022   NA 127 (L) 11/12/2022   K 3.9 11/12/2022   CL 96 (L) 11/12/2022   CO2 24 11/12/2022    Lab Results  Component Value Date   ALT 23 11/12/2022   AST 26 11/12/2022   ALKPHOS 82 11/12/2022   BILITOT 1.0 11/12/2022    Lab Results  Component Value Date   CHOL 58 10/26/2022   HDL 15 (L) 10/26/2022   LDLCALC 29 10/26/2022   TRIG 68 10/26/2022   CHOLHDL 3.9 10/26/2022   No results found for: "LABRPR", "RPRTITER" No results found for: "HIV1RNAQUANT", "HIV1RNAVL", "CD4TABS"   Problem List Items Addressed This Visit   None  Assessment/Plan #MRSA bacteremia #PVD status post vascular surgery March 2021, and the left groin, infected right femorofemoral dacryon bypass status post excision right and left graft and redo of cadaveric vein, cultures + MRSA -8/13 1/2 blood cultures growing MRSA.   Underwent I&D on graft removal on 8/15 with cultures growing MRSA.  8/17 1 out of 2 growing MRSA.  Blood cultures negative on 8/19. MRSA Dapto sensitive. -Dapto x 6 weeks EOT 12/11/22 #Bangdaged on RLQ wound, He sees Dr. Gilmer Mor vascular surger on Friday  -TEE no veg noted.   - During hospitalization I had communicated with vascular surgery that all graft/prosthetic material is out. In that case we will not need suppressive antibiotics after treatment 6 weeks antibiotics  #medication monitoring -I'll give him f/u with myself on 9/24 to finalize end date once seen by vascular post-discharge  #Med monitoring -9/10: Scr 0.81, wbc 10, 9/11: ck 28, crp NR, est 18  Danelle Earthly, MD [facility] 11/27/2022, 3:29 PM   I have personally spent 48 minutes involved in face-to-face and non-face-to-face activities for this patient on the day of the visit. Professional time spent includes the following activities: Preparing to see the patient (review of tests), Obtaining and/or reviewing separately obtained history (admission/discharge record), Performing a medically appropriate examination and/or evaluation , Ordering medications/tests/procedures, referring and communicating with other health care professionals, Documenting clinical information in the EMR, Independently interpreting results (not separately reported), Communicating results to the patient/family/caregiver, Counseling and educating the patient/family/caregiver and Care coordination (not separately reported).

## 2022-11-30 ENCOUNTER — Ambulatory Visit (INDEPENDENT_AMBULATORY_CARE_PROVIDER_SITE_OTHER): Payer: Medicare Other | Admitting: Physician Assistant

## 2022-11-30 VITALS — BP 129/64 | HR 48 | Temp 97.6°F | Resp 18 | Ht 68.0 in | Wt 139.3 lb

## 2022-11-30 DIAGNOSIS — T827XXA Infection and inflammatory reaction due to other cardiac and vascular devices, implants and grafts, initial encounter: Secondary | ICD-10-CM

## 2022-11-30 NOTE — Progress Notes (Signed)
POST OPERATIVE OFFICE NOTE    CC:  F/u for surgery  HPI:  This is a 89 y.o. male who is s/p Femoral-femoral bypass graft excision, Left common femoral vein primary repair, Redo femorofemoral bypass using cadaveric greater saphenous vein, Right common femoral to left profunda with interposition bypass to the superficial femoral artery. Myriad Morcel placement-2 g. Bilateral Prevena wound VAC placement on 10/26/22 by Dr. Karin Lieu and Dr. Chestine Spore.  This was performed secondary to Infected right to left femoral-femoral Dacron bypass graft.  On 10/31/22 he was taken back to the OR by Dr. Lenell Antu due to hematoma of right groin; seroma of left groin after femoral-femoral bypass. Concern for pseudoaneurysm.He underwent 1) direct repair of right common femoral artery pseudoaneurysm 2) incision and drainage of left groin seroma 3) left sartorius flap to left groin 4) right sartorius flap to right groin.   He has been on IV abx (Daptomycin) with PICC line for total of 6 weeks of treatment.     Pt returns today for follow up & here with his daughter.  Pt states he is doing ok.  He is ready to get the staples out.  He denies any pain in his feet. He is still at [facility].    Allergies  Allergen Reactions   Penicillins Other (See Comments)    Current Outpatient Medications  Medication Sig Dispense Refill   aspirin EC 81 MG tablet Take 1 tablet (81 mg total) by mouth daily. Swallow whole. (Patient taking differently: Take 81 mg by mouth daily.) 90 tablet 3   carbamide peroxide (GLY-OXIDE) 10 % solution Place 1 Application onto teeth.     clopidogrel (PLAVIX) 75 MG tablet Take 1 tablet (75 mg total) by mouth daily.     daptomycin (CUBICIN) IVPB Inject 600 mg into the vein daily. Indication:  MRSA bacteremia/endovascular infection First Dose: Yes Last Day of Therapy:  12/11/22 Labs - Once weekly:  CBC/D, BMP, and CPK Labs - Once weekly: ESR and CRP Method of administration: IV Push or per SNF protocol Pull PICC  line at the completion of IV antibiotic therapy Method of administration may be changed at the discretion of home infusion pharmacist based upon assessment of the patient and/or caregiver's ability to self-administer the medication ordered. 34 Units 0   diltiazem (CARDIZEM CD) 180 MG 24 hr capsule Take 180 mg by mouth daily.     diphenhydramine-acetaminophen (TYLENOL PM) 25-500 MG TABS tablet Take 1 tablet by mouth at bedtime as needed (For sleep).     docusate sodium (COLACE) 100 MG capsule Take 1 capsule (100 mg total) by mouth daily.     fluticasone (FLONASE) 50 MCG/ACT nasal spray Place 2 sprays into both nostrils daily.     guaiFENesin-dextromethorphan (ROBITUSSIN DM) 100-10 MG/5ML syrup Take 15 mLs by mouth every 4 (four) hours as needed for cough. (Patient not taking: Reported on 11/27/2022)     loperamide (IMODIUM A-D) 2 MG tablet Take 1 tablet (2 mg total) by mouth 2 (two) times daily as needed for diarrhea or loose stools. 20 tablet 0   loratadine (CLARITIN) 10 MG tablet Take 10 mg by mouth daily.     metoprolol tartrate (LOPRESSOR) 25 MG tablet Take 25 mg by mouth 2 (two) times daily.     Multiple Vitamins-Minerals (MULTIVITAMIN WITH MINERALS) tablet Take 1 tablet by mouth daily.     pantoprazole (PROTONIX) 40 MG tablet Take 1 tablet (40 mg total) by mouth daily.     rosuvastatin (CRESTOR) 20 MG tablet  TAKE 1 TABLET(20 MG) BY MOUTH DAILY (Patient taking differently: Take 20 mg by mouth daily.) 90 tablet 12   senna-docusate (SENOKOT-S) 8.6-50 MG tablet Take 1 tablet by mouth at bedtime as needed for mild constipation.     sodium chloride 1 g tablet Take 1 tablet (1 g total) by mouth 3 (three) times daily with meals.     No current facility-administered medications for this visit.     ROS:  See HPI  Physical Exam:  Today's Vitals   11/30/22 0945 11/30/22 0950  BP: 129/64   Pulse: (!) 48   Resp: 18   Temp: 97.6 F (36.4 C)   TempSrc: Temporal   SpO2: 93%   Weight: 139 lb 4.8 oz  (63.2 kg)   Height: 5\' 8"  (1.727 m)   PainSc:  5    Body mass index is 21.18 kg/m.   Incision:  right groin incisions are clean.  Unable to express any drainage from either.  Bandage removed and had light blue drainage.  Extremities:  fem fem graft is palpable and has palpable pulse     Assessment/Plan:  This is a 89 y.o. male who is s/p: Femoral-femoral bypass graft excision, Left common femoral vein primary repair, Redo femorofemoral bypass using cadaveric greater saphenous vein, Right common femoral to left profunda with interposition bypass to the superficial femoral artery. Myriad Morcel placement-2 g. Bilateral Prevena wound VAC placement on 10/26/22 by Dr. Karin Lieu and Dr. Chestine Spore.  This was performed secondary to Infected right to left femoral-femoral Dacron bypass graft.  On 10/31/22 he was taken back to the OR by Dr. Lenell Antu due to hematoma of right groin; seroma of left groin after femoral-femoral bypass. Concern for pseudoaneurysm.He underwent 1) direct repair of right common femoral artery pseudoaneurysm 2) incision and drainage of left groin seroma 3) left sartorius flap to left groin 4) right sartorius flap to right groin.   -pt seen with Dr. Karin Lieu.  Pt has femoral to femoral bypass graft has palpable graft pulse.  -his incisions are healing nicely.  The medial right groin incision bandage had a blue drainage on the bandage that was removed.  Did not appear like pseudomonas and incision is healing nicely.  We were unable to express any drainage from the incisions.  There was no odor to the dressing.  -the staples were removed.   -instructed pt and instructions for SNF to clean wounds with antibacterial soap daily and then paint right groin incisions with betadine paint daily and apply dry dressing.  -he has appt with ID on 9/24.  -we will see him back in 2-3 weeks to check his incisions.  He and his daughter know to call sooner if any issues before then.    Doreatha Massed,  [facility] [phone]   Clinic MD:  Karin Lieu

## 2022-12-03 ENCOUNTER — Telehealth: Payer: Self-pay

## 2022-12-03 NOTE — Telephone Encounter (Signed)
Please advise 

## 2022-12-03 NOTE — Telephone Encounter (Signed)
Patient's daughter called in regards to upcoming appt tomorrow with Dr. Thedore Mins. She stated that with his age and the pain he usually is in that the load up and bringing him about 40 min to [facility] is difficult. She did ask if there was something physically to be done at this appt, for last time was maybe 5 minutes spent with him.  Could you possibly advise her on this appt, or could I offer a MyChart VV versus a cancellation?

## 2022-12-04 ENCOUNTER — Telehealth: Payer: Self-pay

## 2022-12-04 ENCOUNTER — Ambulatory Visit (INDEPENDENT_AMBULATORY_CARE_PROVIDER_SITE_OTHER): Payer: Medicare Other | Admitting: Internal Medicine

## 2022-12-04 ENCOUNTER — Other Ambulatory Visit: Payer: Self-pay

## 2022-12-04 ENCOUNTER — Encounter: Payer: Self-pay | Admitting: Internal Medicine

## 2022-12-04 VITALS — BP 136/71 | HR 69 | Temp 97.6°F | Ht 68.0 in | Wt 139.0 lb

## 2022-12-04 DIAGNOSIS — B9562 Methicillin resistant Staphylococcus aureus infection as the cause of diseases classified elsewhere: Secondary | ICD-10-CM | POA: Diagnosis not present

## 2022-12-04 DIAGNOSIS — R7881 Bacteremia: Secondary | ICD-10-CM

## 2022-12-04 NOTE — Patient Instructions (Signed)
Pull PICC after last dose of antibiotics on  12/11/22 F/U with ID in one month

## 2022-12-04 NOTE — Telephone Encounter (Signed)
Per Dr. Thedore Mins, okay to pull PICC after last dose on 10/1. Orders sent back with patient to [facility].   Sandie Ano, RN

## 2022-12-04 NOTE — Progress Notes (Signed)
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   MRSA bacteremia 10/27/2022   Sepsis due to methicillin resistant Staphylococcus aureus (MRSA) (HCC) 10/27/2022   Sepsis (HCC) 10/24/2022   Hypokalemia 10/24/2022   Transaminitis 10/24/2022   Hyponatremia 10/24/2022   AAA (abdominal aortic aneurysm) (HCC) 10/24/2022   PAD (peripheral artery disease) (HCC) 10/24/2022   Infection 10/22/2022   Shock (HCC) 09/25/2022   Pneumonia 09/06/2022   Small cell lung cancer, left lower lobe (HCC) 08/30/2022   Mass of left lung 08/21/2022   Infection of vascular bypass graft (HCC) 08/16/2022   Typical atrial flutter (HCC)    Non-healing left groin open wound, initial encounter 07/06/2019   Status post insertion of iliac artery stent 06/08/2019   Peripheral vascular disease (HCC) 06/08/2019    Patient's Medications  New Prescriptions   No medications on file  Previous Medications   ASPIRIN EC 81 MG TABLET    Take 1 tablet (81 mg total) by mouth daily. Swallow whole.   CLOPIDOGREL (PLAVIX) 75 MG TABLET    Take 1 tablet (75 mg total) by mouth daily.   DAPTOMYCIN (CUBICIN) IVPB    Inject 600 mg into the vein daily. Indication:  MRSA bacteremia/endovascular infection First Dose: Yes Last Day of Therapy:  12/11/22 Labs - Once weekly:  CBC/D, BMP, and CPK Labs - Once weekly: ESR and CRP Method of administration: IV Push or per SNF protocol Pull PICC line at the completion of IV antibiotic therapy Method of administration may be changed at the discretion of home infusion pharmacist based upon assessment of the patient and/or caregiver's ability to self-administer the medication ordered.   DILTIAZEM (CARDIZEM CD) 180 MG 24 HR CAPSULE    Take 180 mg by mouth daily.   DIPHENHYDRAMINE-ACETAMINOPHEN (TYLENOL PM) 25-500 MG TABS TABLET    Take 1 tablet by mouth at bedtime as needed (For sleep).   DOCUSATE SODIUM (COLACE) 100 MG CAPSULE    Take 1  capsule (100 mg total) by mouth daily.   FLUTICASONE (FLONASE) 50 MCG/ACT NASAL SPRAY    Place 2 sprays into both nostrils daily.   GUAIFENESIN-DEXTROMETHORPHAN (ROBITUSSIN DM) 100-10 MG/5ML SYRUP    Take 15 mLs by mouth every 4 (four) hours as needed for cough.   LOPERAMIDE (IMODIUM A-D) 2 MG TABLET    Take 1 tablet (2 mg total) by mouth 2 (two) times daily as needed for diarrhea or loose stools.   LORATADINE (CLARITIN) 10 MG TABLET    Take 10 mg by mouth daily.   METOPROLOL TARTRATE (LOPRESSOR) 25 MG TABLET    Take 25 mg by mouth 2 (two) times daily.   MULTIPLE VITAMINS-MINERALS (MULTIVITAMIN WITH MINERALS) TABLET    Take 1 tablet by mouth daily.   PANTOPRAZOLE (PROTONIX) 40 MG TABLET    Take 1 tablet (40 mg total) by mouth daily.   ROSUVASTATIN (CRESTOR) 20 MG TABLET    TAKE 1 TABLET(20 MG) BY MOUTH DAILY   SENNA-DOCUSATE (SENOKOT-S) 8.6-50 MG TABLET    Take 1 tablet by mouth at bedtime as needed for mild constipation.   SODIUM CHLORIDE 1 G TABLET    Take 1 tablet (1 g total) by mouth 3 (three) times daily with meals.  Modified Medications   No medications on file  Discontinued Medications   No medications on file    Subjective: 89 year old male with past medical history as below including PVD status post  vascular surgery in March 2021, complicated I&D of left groin then wound status post I&D with placement of Screven graft, recently found to have SCLC presents for hospital management of MRSA bacteremia, infected right to left femorofemoral Dacron bypass status post excision of all right to left graft and redo with cadaveric vein and I&D on 8/15 with cultures growing MRSA.  TTE and TEE showed no vegetation.  Patient discharged on daptomycin since 6 weeks for endovascular involvement EOT 12/11/2022. Today patient is tolerating antibiotics.  No new complaints.  12/04/22: Presents with daughter. No new complaints.  Review of Systems: Review of Systems  All other systems reviewed and are  negative.   Past Medical History:  Diagnosis Date   AAA (abdominal aortic aneurysm) (HCC)    Anxiety    Arthritis    BPH (benign prostatic hyperplasia)    Dizziness    Hyperlipidemia    PVD (peripheral vascular disease) (HCC)    S/p fem-fem bypass grafting 08/2022   SCLC (small cell lung carcinoma), left (HCC)     Social History   Tobacco Use   Smoking status: Former    Current packs/day: 0.00    Average packs/day: 1.5 packs/day for 70.0 years (105.0 ttl pk-yrs)    Types: Cigarettes    Start date: 05/29/1949    Quit date: 05/30/2019    Years since quitting: 3.5   Smokeless tobacco: Never  Vaping Use   Vaping status: Never Used  Substance Use Topics   Alcohol use: Yes    Alcohol/week: 3.0 standard drinks of alcohol    Types: 3 Shots of liquor per week    Comment: 3 2oz. shots of bourbon/day   Drug use: No    Family History  Problem Relation Age of Onset   Stroke Mother    Pseudochol deficiency Neg Hx    Malignant hyperthermia Neg Hx    Hypotension Neg Hx    Anesthesia problems Neg Hx     Allergies  Allergen Reactions   Penicillins Other (See Comments)    Health Maintenance  Topic Date Due   Medicare Annual Wellness (AWV)  Never done   COVID-19 Vaccine (1) Never done   DTaP/Tdap/Td (1 - Tdap) Never done   Zoster Vaccines- Shingrix (1 of 2) Never done   Pneumonia Vaccine 36+ Years old (1 of 1 - PCV) Never done   INFLUENZA VACCINE  Never done   HPV VACCINES  Aged Out    Objective:  There were no vitals filed for this visit. There is no height or weight on file to calculate BMI.  Physical Exam Constitutional:      General: He is not in acute distress.    Appearance: He is normal weight. He is not toxic-appearing.  HENT:     Head: Normocephalic and atraumatic.     Right Ear: External ear normal.     Left Ear: External ear normal.     Nose: No congestion or rhinorrhea.     Mouth/Throat:     Mouth: Mucous membranes are moist.     Pharynx: Oropharynx  is clear.  Eyes:     Extraocular Movements: Extraocular movements intact.     Conjunctiva/sclera: Conjunctivae normal.     Pupils: Pupils are equal, round, and reactive to light.  Cardiovascular:     Rate and Rhythm: Normal rate and regular rhythm.     Heart sounds: No murmur heard.    No friction rub. No gallop.  Pulmonary:     Effort: Pulmonary  effort is normal.     Breath sounds: Normal breath sounds.  Abdominal:     General: Abdomen is flat. Bowel sounds are normal.     Palpations: Abdomen is soft.  Musculoskeletal:        General: No swelling.     Cervical back: Normal range of motion and neck supple.  Skin:    General: Skin is warm and dry.  Neurological:     General: No focal deficit present.     Mental Status: He is oriented to person, place, and time.  Psychiatric:        Mood and Affect: Mood normal.     Lab Results Lab Results  Component Value Date   WBC 6.6 11/12/2022   HGB 8.1 (L) 11/12/2022   HCT 24.3 (L) 11/12/2022   MCV 90.0 11/12/2022   PLT 297 11/12/2022    Lab Results  Component Value Date   CREATININE 0.89 11/12/2022   BUN 16 11/12/2022   NA 127 (L) 11/12/2022   K 3.9 11/12/2022   CL 96 (L) 11/12/2022   CO2 24 11/12/2022    Lab Results  Component Value Date   ALT 23 11/12/2022   AST 26 11/12/2022   ALKPHOS 82 11/12/2022   BILITOT 1.0 11/12/2022    Lab Results  Component Value Date   CHOL 58 10/26/2022   HDL 15 (L) 10/26/2022   LDLCALC 29 10/26/2022   TRIG 68 10/26/2022   CHOLHDL 3.9 10/26/2022   No results found for: "LABRPR", "RPRTITER" No results found for: "HIV1RNAQUANT", "HIV1RNAVL", "CD4TABS"   Problem List Items Addressed This Visit   None  Assessment/Plan #MRSA bacteremia #PVD status post vascular surgery March 2021, and the left groin, infected right femorofemoral dacryon bypass status post excision right and left graft and redo of cadaveric vein, cultures + MRSA -8/13 1/2 blood cultures growing MRSA.   Underwent I&D on  graft removal on 8/15 with cultures growing MRSA.  8/17 1 out of 2 growing MRSA.  Blood cultures negative on 8/19. MRSA Dapto sensitive. -Dapto x 6 weeks EOT 12/11/22 #Bangdaged on RLQ wound, He sees Dr. Gilmer Mor vascular surger on Friday  -TEE no veg noted.  - During hospitalization I had communicated with vascular surgery that all graft/prosthetic material is out. In that case we will not need suppressive antibiotics after treatment 6 weeks antibiotics  -Seen by vascular on 11/30/22 wound appear to healing well. Staples out. I evaluated the wound today, (uses betadine). No c/f infection. Labs overall stable Plan: -Pull PICC following last dose of antibiotics on 12/04/22 -F/U with ID in  one month to do labs off of antibiotics   #Med monitoring #PICC -9/10: Scr 0.81, wbc 10, 9/11: ck 28, crp NR, est 18 -9/17 wbc 8.8, esr 73     Danelle Earthly, MD [facility] 12/04/2022, 8:56 AM   I have personally spent 42 minutes involved in face-to-face and non-face-to-face activities for this patient on the day of the visit. Professional time spent includes the following activities: Preparing to see the patient (review of tests), Obtaining and/or reviewing separately obtained history (admission/discharge record), Performing a medically appropriate examination and/or evaluation , Ordering medications/tests/procedures, referring and communicating with other health care professionals, Documenting clinical information in the EMR, Independently interpreting results (not separately reported), Communicating results to the patient/family/caregiver, Counseling and educating the patient/family/caregiver and Care coordination (not separately reported).

## 2022-12-17 ENCOUNTER — Inpatient Hospital Stay (HOSPITAL_COMMUNITY)
Admission: EM | Admit: 2022-12-17 | Discharge: 2022-12-24 | DRG: 871 | Disposition: A | Discharge status: Skilled Nursing Facility | Payer: Medicare Other | Source: Skilled Nursing Facility | Attending: Internal Medicine | Admitting: Internal Medicine

## 2022-12-17 ENCOUNTER — Other Ambulatory Visit: Payer: Self-pay

## 2022-12-17 ENCOUNTER — Encounter (HOSPITAL_COMMUNITY): Payer: Self-pay

## 2022-12-17 ENCOUNTER — Emergency Department (HOSPITAL_COMMUNITY): Payer: Medicare Other

## 2022-12-17 DIAGNOSIS — Z7982 Long term (current) use of aspirin: Secondary | ICD-10-CM

## 2022-12-17 DIAGNOSIS — M6281 Muscle weakness (generalized): Secondary | ICD-10-CM | POA: Diagnosis not present

## 2022-12-17 DIAGNOSIS — J9621 Acute and chronic respiratory failure with hypoxia: Secondary | ICD-10-CM | POA: Diagnosis not present

## 2022-12-17 DIAGNOSIS — J984 Other disorders of lung: Secondary | ICD-10-CM | POA: Diagnosis not present

## 2022-12-17 DIAGNOSIS — E785 Hyperlipidemia, unspecified: Secondary | ICD-10-CM | POA: Diagnosis present

## 2022-12-17 DIAGNOSIS — R2689 Other abnormalities of gait and mobility: Secondary | ICD-10-CM | POA: Diagnosis not present

## 2022-12-17 DIAGNOSIS — C7951 Secondary malignant neoplasm of bone: Secondary | ICD-10-CM | POA: Diagnosis not present

## 2022-12-17 DIAGNOSIS — R079 Chest pain, unspecified: Secondary | ICD-10-CM | POA: Diagnosis not present

## 2022-12-17 DIAGNOSIS — J9601 Acute respiratory failure with hypoxia: Secondary | ICD-10-CM | POA: Diagnosis not present

## 2022-12-17 DIAGNOSIS — R7989 Other specified abnormal findings of blood chemistry: Secondary | ICD-10-CM | POA: Insufficient documentation

## 2022-12-17 DIAGNOSIS — K5900 Constipation, unspecified: Secondary | ICD-10-CM | POA: Diagnosis not present

## 2022-12-17 DIAGNOSIS — Z66 Do not resuscitate: Secondary | ICD-10-CM | POA: Diagnosis not present

## 2022-12-17 DIAGNOSIS — R0602 Shortness of breath: Secondary | ICD-10-CM | POA: Diagnosis not present

## 2022-12-17 DIAGNOSIS — R2681 Unsteadiness on feet: Secondary | ICD-10-CM | POA: Diagnosis not present

## 2022-12-17 DIAGNOSIS — I714 Abdominal aortic aneurysm, without rupture, unspecified: Secondary | ICD-10-CM | POA: Diagnosis present

## 2022-12-17 DIAGNOSIS — D649 Anemia, unspecified: Secondary | ICD-10-CM | POA: Diagnosis present

## 2022-12-17 DIAGNOSIS — Z7401 Bed confinement status: Secondary | ICD-10-CM | POA: Diagnosis not present

## 2022-12-17 DIAGNOSIS — Z800 Family history of malignant neoplasm of digestive organs: Secondary | ICD-10-CM

## 2022-12-17 DIAGNOSIS — N400 Enlarged prostate without lower urinary tract symptoms: Secondary | ICD-10-CM | POA: Diagnosis present

## 2022-12-17 DIAGNOSIS — I483 Typical atrial flutter: Secondary | ICD-10-CM | POA: Diagnosis not present

## 2022-12-17 DIAGNOSIS — G9341 Metabolic encephalopathy: Secondary | ICD-10-CM | POA: Diagnosis present

## 2022-12-17 DIAGNOSIS — R0902 Hypoxemia: Secondary | ICD-10-CM | POA: Diagnosis not present

## 2022-12-17 DIAGNOSIS — J690 Pneumonitis due to inhalation of food and vomit: Secondary | ICD-10-CM | POA: Diagnosis present

## 2022-12-17 DIAGNOSIS — A4102 Sepsis due to Methicillin resistant Staphylococcus aureus: Secondary | ICD-10-CM

## 2022-12-17 DIAGNOSIS — C3490 Malignant neoplasm of unspecified part of unspecified bronchus or lung: Secondary | ICD-10-CM | POA: Diagnosis not present

## 2022-12-17 DIAGNOSIS — Z79899 Other long term (current) drug therapy: Secondary | ICD-10-CM

## 2022-12-17 DIAGNOSIS — I2489 Other forms of acute ischemic heart disease: Secondary | ICD-10-CM | POA: Diagnosis present

## 2022-12-17 DIAGNOSIS — R4182 Altered mental status, unspecified: Principal | ICD-10-CM

## 2022-12-17 DIAGNOSIS — R531 Weakness: Secondary | ICD-10-CM | POA: Diagnosis not present

## 2022-12-17 DIAGNOSIS — E43 Unspecified severe protein-calorie malnutrition: Secondary | ICD-10-CM | POA: Diagnosis not present

## 2022-12-17 DIAGNOSIS — I5033 Acute on chronic diastolic (congestive) heart failure: Secondary | ICD-10-CM | POA: Diagnosis not present

## 2022-12-17 DIAGNOSIS — C3492 Malignant neoplasm of unspecified part of left bronchus or lung: Secondary | ICD-10-CM | POA: Diagnosis not present

## 2022-12-17 DIAGNOSIS — C3432 Malignant neoplasm of lower lobe, left bronchus or lung: Secondary | ICD-10-CM | POA: Diagnosis not present

## 2022-12-17 DIAGNOSIS — J811 Chronic pulmonary edema: Secondary | ICD-10-CM | POA: Diagnosis not present

## 2022-12-17 DIAGNOSIS — R410 Disorientation, unspecified: Secondary | ICD-10-CM | POA: Diagnosis not present

## 2022-12-17 DIAGNOSIS — R000 Tachycardia, unspecified: Secondary | ICD-10-CM | POA: Diagnosis not present

## 2022-12-17 DIAGNOSIS — B9562 Methicillin resistant Staphylococcus aureus infection as the cause of diseases classified elsewhere: Secondary | ICD-10-CM | POA: Diagnosis not present

## 2022-12-17 DIAGNOSIS — J189 Pneumonia, unspecified organism: Secondary | ICD-10-CM | POA: Diagnosis present

## 2022-12-17 DIAGNOSIS — R918 Other nonspecific abnormal finding of lung field: Secondary | ICD-10-CM | POA: Diagnosis not present

## 2022-12-17 DIAGNOSIS — R6520 Severe sepsis without septic shock: Secondary | ICD-10-CM | POA: Diagnosis present

## 2022-12-17 DIAGNOSIS — N183 Chronic kidney disease, stage 3 unspecified: Secondary | ICD-10-CM | POA: Diagnosis not present

## 2022-12-17 DIAGNOSIS — J90 Pleural effusion, not elsewhere classified: Secondary | ICD-10-CM | POA: Diagnosis not present

## 2022-12-17 DIAGNOSIS — Z823 Family history of stroke: Secondary | ICD-10-CM

## 2022-12-17 DIAGNOSIS — I739 Peripheral vascular disease, unspecified: Secondary | ICD-10-CM | POA: Diagnosis present

## 2022-12-17 DIAGNOSIS — E871 Hypo-osmolality and hyponatremia: Secondary | ICD-10-CM | POA: Diagnosis not present

## 2022-12-17 DIAGNOSIS — Z515 Encounter for palliative care: Secondary | ICD-10-CM

## 2022-12-17 DIAGNOSIS — F05 Delirium due to known physiological condition: Secondary | ICD-10-CM | POA: Diagnosis not present

## 2022-12-17 DIAGNOSIS — R7401 Elevation of levels of liver transaminase levels: Secondary | ICD-10-CM | POA: Diagnosis not present

## 2022-12-17 DIAGNOSIS — Z880 Allergy status to penicillin: Secondary | ICD-10-CM

## 2022-12-17 DIAGNOSIS — A419 Sepsis, unspecified organism: Principal | ICD-10-CM | POA: Diagnosis present

## 2022-12-17 DIAGNOSIS — R1312 Dysphagia, oropharyngeal phase: Secondary | ICD-10-CM | POA: Diagnosis not present

## 2022-12-17 DIAGNOSIS — Z7901 Long term (current) use of anticoagulants: Secondary | ICD-10-CM | POA: Diagnosis not present

## 2022-12-17 DIAGNOSIS — Z7902 Long term (current) use of antithrombotics/antiplatelets: Secondary | ICD-10-CM

## 2022-12-17 DIAGNOSIS — I700 Atherosclerosis of aorta: Secondary | ICD-10-CM | POA: Diagnosis not present

## 2022-12-17 DIAGNOSIS — Z1152 Encounter for screening for COVID-19: Secondary | ICD-10-CM

## 2022-12-17 DIAGNOSIS — E876 Hypokalemia: Secondary | ICD-10-CM | POA: Diagnosis not present

## 2022-12-17 DIAGNOSIS — R54 Age-related physical debility: Secondary | ICD-10-CM | POA: Diagnosis present

## 2022-12-17 DIAGNOSIS — Z87891 Personal history of nicotine dependence: Secondary | ICD-10-CM

## 2022-12-17 DIAGNOSIS — T827XXD Infection and inflammatory reaction due to other cardiac and vascular devices, implants and grafts, subsequent encounter: Secondary | ICD-10-CM | POA: Diagnosis not present

## 2022-12-17 DIAGNOSIS — Z8614 Personal history of Methicillin resistant Staphylococcus aureus infection: Secondary | ICD-10-CM

## 2022-12-17 DIAGNOSIS — K219 Gastro-esophageal reflux disease without esophagitis: Secondary | ICD-10-CM | POA: Diagnosis not present

## 2022-12-17 DIAGNOSIS — I517 Cardiomegaly: Secondary | ICD-10-CM | POA: Diagnosis not present

## 2022-12-17 LAB — CBC
HCT: 34.8 % — ABNORMAL LOW (ref 39.0–52.0)
Hemoglobin: 11.2 g/dL — ABNORMAL LOW (ref 13.0–17.0)
MCH: 27.9 pg (ref 26.0–34.0)
MCHC: 32.2 g/dL (ref 30.0–36.0)
MCV: 86.6 fL (ref 80.0–100.0)
Platelets: 305 10*3/uL (ref 150–400)
RBC: 4.02 MIL/uL — ABNORMAL LOW (ref 4.22–5.81)
RDW: 16.9 % — ABNORMAL HIGH (ref 11.5–15.5)
WBC: 13.4 10*3/uL — ABNORMAL HIGH (ref 4.0–10.5)
nRBC: 0 % (ref 0.0–0.2)

## 2022-12-17 LAB — COMPREHENSIVE METABOLIC PANEL
ALT: 17 U/L (ref 0–44)
AST: 23 U/L (ref 15–41)
Albumin: 2.8 g/dL — ABNORMAL LOW (ref 3.5–5.0)
Alkaline Phosphatase: 93 U/L (ref 38–126)
Anion gap: 12 (ref 5–15)
BUN: 28 mg/dL — ABNORMAL HIGH (ref 8–23)
CO2: 24 mmol/L (ref 22–32)
Calcium: 9.5 mg/dL (ref 8.9–10.3)
Chloride: 107 mmol/L (ref 98–111)
Creatinine, Ser: 1 mg/dL (ref 0.61–1.24)
GFR, Estimated: >60 mL/min (ref >60)
Glucose, Bld: 105 mg/dL — ABNORMAL HIGH (ref 70–99)
Potassium: 3 mmol/L — ABNORMAL LOW (ref 3.5–5.1)
Sodium: 143 mmol/L (ref 135–145)
Total Bilirubin: 0.9 mg/dL (ref 0.3–1.2)
Total Protein: 6 g/dL — ABNORMAL LOW (ref 6.5–8.1)

## 2022-12-17 LAB — BLOOD GAS, VENOUS
Acid-Base Excess: 0.5 mmol/L (ref 0.0–2.0)
Bicarbonate: 24.6 mmol/L (ref 20.0–28.0)
O2 Saturation: 63.3 %
Patient temperature: 37
pCO2, Ven: 37 mm[Hg] — ABNORMAL LOW (ref 44–60)
pH, Ven: 7.43 (ref 7.25–7.43)
pO2, Ven: 42 mm[Hg] (ref 32–45)

## 2022-12-17 LAB — LACTIC ACID, PLASMA: Lactic Acid, Venous: 1.9 mmol/L (ref 0.5–1.9)

## 2022-12-17 LAB — SARS CORONAVIRUS 2 BY RT PCR: SARS Coronavirus 2 by RT PCR: NEGATIVE

## 2022-12-17 LAB — CBG MONITORING, ED: Glucose-Capillary: 98 mg/dL (ref 70–99)

## 2022-12-17 LAB — PROCALCITONIN: Procalcitonin: 0.8 ng/mL

## 2022-12-17 LAB — TROPONIN I (HIGH SENSITIVITY)
Troponin I (High Sensitivity): 191 ng/L (ref <18)
Troponin I (High Sensitivity): 192 ng/L (ref <18)
Troponin I (High Sensitivity): 181 ng/L (ref <18)

## 2022-12-17 LAB — BRAIN NATRIURETIC PEPTIDE: B Natriuretic Peptide: 421.2 pg/mL — ABNORMAL HIGH (ref 0.0–100.0)

## 2022-12-17 MED ORDER — SODIUM CHLORIDE 0.9 % IV SOLN: INTRAVENOUS | Status: DC

## 2022-12-17 MED ORDER — HEPARIN SODIUM (PORCINE) 5000 UNIT/ML IJ SOLN
5000.0000 [IU] | Freq: Three times a day (TID) | INTRAMUSCULAR | Status: DC
  Administered 2022-12-17 – 2022-12-24 (×20): 5000 [IU] via SUBCUTANEOUS
  Filled 2022-12-17 (×21): qty 1

## 2022-12-17 MED ORDER — POTASSIUM CHLORIDE CRYS ER 20 MEQ PO TBCR
20.0000 meq | EXTENDED_RELEASE_TABLET | Freq: Once | ORAL | Status: DC
  Filled 2022-12-17: qty 1

## 2022-12-17 MED ORDER — POTASSIUM CHLORIDE 10 MEQ/100ML IV SOLN
10.0000 meq | INTRAVENOUS | Status: AC
  Administered 2022-12-17 – 2022-12-18 (×4): 10 meq via INTRAVENOUS
  Filled 2022-12-17 (×4): qty 100

## 2022-12-17 MED ORDER — ACETAMINOPHEN 325 MG PO TABS
650.0000 mg | ORAL_TABLET | Freq: Four times a day (QID) | ORAL | Status: DC | PRN
  Administered 2022-12-18 – 2022-12-23 (×3): 650 mg via ORAL
  Filled 2022-12-17 (×3): qty 2

## 2022-12-17 MED ORDER — ASPIRIN 81 MG PO TBEC
81.0000 mg | DELAYED_RELEASE_TABLET | Freq: Every day | ORAL | Status: DC
  Administered 2022-12-18 – 2022-12-24 (×7): 81 mg via ORAL
  Filled 2022-12-17 (×7): qty 1

## 2022-12-17 MED ORDER — LACTATED RINGERS IV BOLUS
1000.0000 mL | Freq: Once | INTRAVENOUS | Status: AC
  Administered 2022-12-17: 1000 mL via INTRAVENOUS

## 2022-12-17 MED ORDER — ONDANSETRON HCL 4 MG/2ML IJ SOLN: 4.0000 mg | Freq: Four times a day (QID) | INTRAMUSCULAR | Status: DC | PRN

## 2022-12-17 MED ORDER — ONDANSETRON HCL 4 MG PO TABS: 4.0000 mg | ORAL_TABLET | Freq: Four times a day (QID) | ORAL | Status: DC | PRN

## 2022-12-17 MED ORDER — SODIUM CHLORIDE 0.9 % IV SOLN
2.0000 g | Freq: Once | INTRAVENOUS | Status: AC
  Administered 2022-12-17: 2 g via INTRAVENOUS
  Filled 2022-12-17: qty 20

## 2022-12-17 MED ORDER — SODIUM CHLORIDE 0.9 % IV SOLN
500.0000 mg | Freq: Once | INTRAVENOUS | Status: AC
  Administered 2022-12-17: 500 mg via INTRAVENOUS
  Filled 2022-12-17: qty 5

## 2022-12-17 MED ORDER — METOPROLOL TARTRATE 25 MG PO TABS
25.0000 mg | ORAL_TABLET | Freq: Two times a day (BID) | ORAL | Status: DC
  Administered 2022-12-17 – 2022-12-23 (×13): 25 mg via ORAL
  Filled 2022-12-17 (×13): qty 1

## 2022-12-17 MED ORDER — ALBUTEROL SULFATE (2.5 MG/3ML) 0.083% IN NEBU: 2.5000 mg | INHALATION_SOLUTION | RESPIRATORY_TRACT | Status: DC | PRN

## 2022-12-17 MED ORDER — ACETAMINOPHEN 650 MG RE SUPP: 650.0000 mg | Freq: Four times a day (QID) | RECTAL | Status: DC | PRN

## 2022-12-17 MED ORDER — CLOPIDOGREL BISULFATE 75 MG PO TABS
75.0000 mg | ORAL_TABLET | Freq: Every day | ORAL | Status: DC
  Administered 2022-12-18 – 2022-12-24 (×7): 75 mg via ORAL
  Filled 2022-12-17 (×7): qty 1

## 2022-12-17 MED ORDER — SODIUM CHLORIDE 0.9 % IV SOLN
500.0000 mg | INTRAVENOUS | Status: DC
  Administered 2022-12-18: 500 mg via INTRAVENOUS
  Filled 2022-12-17: qty 5

## 2022-12-17 MED ORDER — PANTOPRAZOLE SODIUM 40 MG PO TBEC
40.0000 mg | DELAYED_RELEASE_TABLET | Freq: Every day | ORAL | Status: DC
  Administered 2022-12-18 – 2022-12-24 (×7): 40 mg via ORAL
  Filled 2022-12-17 (×7): qty 1

## 2022-12-17 MED ORDER — DILTIAZEM HCL ER COATED BEADS 180 MG PO CP24
180.0000 mg | ORAL_CAPSULE | Freq: Every day | ORAL | Status: DC
  Administered 2022-12-18 – 2022-12-23 (×6): 180 mg via ORAL
  Filled 2022-12-17 (×6): qty 1

## 2022-12-17 MED ORDER — OXYCODONE HCL 5 MG PO TABS
5.0000 mg | ORAL_TABLET | ORAL | Status: DC | PRN
  Administered 2022-12-20 – 2022-12-24 (×3): 5 mg via ORAL
  Filled 2022-12-17 (×3): qty 1

## 2022-12-17 MED ORDER — ROSUVASTATIN CALCIUM 20 MG PO TABS
20.0000 mg | ORAL_TABLET | Freq: Every day | ORAL | Status: DC
  Administered 2022-12-18 – 2022-12-24 (×7): 20 mg via ORAL
  Filled 2022-12-17 (×7): qty 1

## 2022-12-17 MED ORDER — SODIUM CHLORIDE 0.9 % IV SOLN
2.0000 g | INTRAVENOUS | Status: AC
  Administered 2022-12-18 – 2022-12-22 (×5): 2 g via INTRAVENOUS
  Filled 2022-12-17 (×5): qty 20

## 2022-12-17 NOTE — ED Notes (Signed)
Patient transported to CT 

## 2022-12-17 NOTE — ED Notes (Signed)
Awaiting patient from lobby 

## 2022-12-17 NOTE — H&P (Signed)
History and Physical    Patient: Edward Campos YQM:[id number] DOB: 06/23/1932 DOA: 12/17/2022 DOS: the patient was seen and examined on 12/17/2022 PCP: Patient, No Pcp Per  Patient coming from: {Point_of_Origin:[address]777}  Chief Complaint:  Chief Complaint  Patient presents with   Altered Mental Status   HPI: Edward Campos is a 89 y.o. male with medical history significant of ***  Review of Systems: {ROS_Text:26778} Past Medical History:  Diagnosis Date   AAA (abdominal aortic aneurysm) (HCC)    Anxiety    Arthritis    BPH (benign prostatic hyperplasia)    Dizziness    Hyperlipidemia    PVD (peripheral vascular disease) (HCC)    S/p fem-fem bypass grafting 08/2022   SCLC (small cell lung carcinoma), left (HCC)    Past Surgical History:  Procedure Laterality Date   ABDOMINAL AORTOGRAM W/LOWER EXTREMITY N/A 05/06/2019   Procedure: ABDOMINAL AORTOGRAM W/LOWER EXTREMITY;  Surgeon: Cephus Shelling, MD;  Location: [facility] INVASIVE CV LAB;  Service: Cardiovascular;  Laterality: N/A;   ABDOMINAL WOUND DEHISCENCE N/A 08/22/2022   Procedure: WASHOUT ABDOMEN WOUND AND VAC CHANGE;  Surgeon: Cephus Shelling, MD;  Location: [facility] OR;  Service: Vascular;  Laterality: N/A;   APPLICATION OF WOUND VAC  07/06/2019   Procedure: Application Of Wound Vac Left Groin;  Surgeon: Sherren Kerns, MD;  Location: [facility] OR;  Service: Vascular;;   APPLICATION OF WOUND VAC N/A 08/16/2022   Procedure: APPLICATION OF WOUND VAC;  Surgeon: [name], MD;  Location: [facility] OR;  Service: Vascular;  Laterality: N/A;   APPLICATION OF WOUND VAC N/A 10/31/2022   Procedure: APPLICATION OF WOUND VAC;  Surgeon: Leonie Douglas, MD;  Location: [facility] OR;  Service: Vascular;  Laterality: N/A;   BRONCHIAL BIOPSY  08/21/2022   Procedure: BRONCHIAL BIOPSIES;  Surgeon: Oretha Milch, MD;  Location: [facility] ENDOSCOPY;  Service: Cardiopulmonary;;   BRONCHIAL BRUSHINGS  08/21/2022   Procedure: BRONCHIAL BRUSHINGS;  Surgeon: Oretha Milch, MD;  Location: [facility] ENDOSCOPY;  Service: Cardiopulmonary;;   BRONCHIAL WASHINGS  08/21/2022   Procedure: BRONCHIAL WASHINGS;  Surgeon: Oretha Milch, MD;  Location: [facility] ENDOSCOPY;  Service: Cardiopulmonary;;   CARDIOVERSION N/A 11/03/2020   Procedure: CARDIOVERSION;  Surgeon: Thurmon Fair, MD;  Location: [facility] ENDOSCOPY;  Service: Cardiovascular;  Laterality: N/A;   ENDARTERECTOMY FEMORAL Bilateral 06/08/2019   Procedure: ENDARTERECTOMY RIGHT COMMON FEMORAL; ENDARTERECTOMY RIGHT EXTERNAL ILIAC; ENDARTERECTOMY LEFT COMMON FEMORAL; ENDARTERECTOMY LEFT EXTERNAL ILIAC;  Surgeon: Sherren Kerns, MD;  Location: [facility] OR;  Service: Vascular;  Laterality: Bilateral;   FEMORAL-FEMORAL BYPASS GRAFT Bilateral 06/08/2019   Procedure: BYPASS GRAFT FEMORAL-FEMORAL ARTERY;  Surgeon: Sherren Kerns, MD;  Location: [facility] OR;  Service: Vascular;  Laterality: Bilateral;   FEMORAL-FEMORAL BYPASS GRAFT Bilateral 10/25/2022   Procedure: EXCISION BYPASS GRAFT FEMORAL-FEMORAL ARTERY AND REDO FEMORAL-FEMORAL BYPASS GRAFT USING CRYO-SAPHENOUS VEIN 79CMLX3-6MMd;  Surgeon: Victorino Sparrow, MD;  Location: [facility] OR;  Service: Vascular;  Laterality: Bilateral;   GROIN DEBRIDEMENT Left 07/06/2019   Procedure: INCISION AND DRAINAGE OF LEFT GROIN;  Surgeon: Sherren Kerns, MD;  Location: [facility] OR;  Service: Vascular;  Laterality: Left;   growth removed from chest     HEMORRHOID SURGERY     HEMOSTASIS CONTROL  08/21/2022   Procedure: HEMOSTASIS CONTROL;  Surgeon: Oretha Milch, MD;  Location: [facility] ENDOSCOPY;  Service: Cardiopulmonary;;   HERNIA REPAIR  12 yrs ago   left inguinal hernia   I & D EXTREMITY Bilateral 10/31/2022   Procedure: BILATERAL GROIN  WASHOUT AND MUSCLE FLAPS;  Surgeon: Leonie Douglas, MD;  Location: [facility] OR;  Service: Vascular;  Laterality: Bilateral;   INCISE AND DRAIN ABCESS Left 07/06/2019   Incision and drainage left groin placement of VAC dressing   INCISION AND DRAINAGE OF WOUND Right 08/16/2022    Procedure: IRRIGATION AND DEBRIDEMENT WOUND;  Surgeon: [name], MD;  Location: [facility] OR;  Service: Vascular;  Laterality: Right;   INGUINAL HERNIA REPAIR  10/16/2010   Procedure: HERNIA REPAIR INGUINAL ADULT;  Surgeon: Dalia Heading;  Location: [facility] ORS;  Service: General;  Laterality: Right;   INSERTION OF ILIAC STENT Right 06/08/2019   Procedure: INSERTION OF RIGHT COMMON ILIAC AND EXTERNAL ILICAC STENT;  Surgeon: Sherren Kerns, MD;  Location: [facility] OR;  Service: Vascular;  Laterality: Right;   PATCH ANGIOPLASTY Right 06/08/2019   Procedure: Patch Angioplasty Right Common Femoral;  Surgeon: Sherren Kerns, MD;  Location: [facility] OR;  Service: Vascular;  Laterality: Right;   TEE WITHOUT CARDIOVERSION N/A 11/07/2022   Procedure: TRANSESOPHAGEAL ECHOCARDIOGRAM;  Surgeon: Jodelle Red, MD;  Location: [facility] INVASIVE CV LAB;  Service: Cardiovascular;  Laterality: N/A;   VIDEO BRONCHOSCOPY N/A 08/21/2022   Procedure: VIDEO BRONCHOSCOPY WITH FLUORO;  Surgeon: Oretha Milch, MD;  Location: [facility] ENDOSCOPY;  Service: Cardiopulmonary;  Laterality: N/A;   Social History:  reports that he quit smoking about 3 years ago. His smoking use included cigarettes. He started smoking about 73 years ago. He has a 105 pack-year smoking history. He has never used smokeless tobacco. He reports that he does not currently use alcohol after a past usage of about 3.0 standard drinks of alcohol per week. He reports that he does not use drugs.  Allergies  Allergen Reactions   Penicillins Other (See Comments)    Family History  Problem Relation Age of Onset   Stroke Mother    Pseudochol deficiency Neg Hx    Malignant hyperthermia Neg Hx    Hypotension Neg Hx    Anesthesia problems Neg Hx     Prior to Admission medications   Medication Sig Start Date End Date Taking? Authorizing Provider  Amino Acids-Protein Hydrolys (PRO-STAT PO) Take 30 mLs by mouth in the morning and at bedtime.   Yes [provider]  aspirin EC 81 MG tablet Take 1 tablet (81 mg total) by mouth daily. Swallow whole. Patient taking differently: Take 81 mg by mouth once. 06/30/21  Yes O'Neal, Ronnald Ramp, MD  Cholecalciferol (VITAMIN D-3) 125 MCG (5000 UT) TABS Take by mouth.   Yes [provider]  clopidogrel (PLAVIX) 75 MG tablet Take 1 tablet (75 mg total) by mouth daily. 11/13/22  Yes Rai, Ripudeep K, MD  diltiazem (CARDIZEM CD) 180 MG 24 hr capsule Take 180 mg by mouth daily.   Yes [provider]  fluticasone (FLONASE) 50 MCG/ACT nasal spray Place 2 sprays into both nostrils daily. 11/13/22  Yes Rai, Ripudeep K, MD  loratadine (CLARITIN) 10 MG tablet Take 10 mg by mouth daily.   Yes [provider]  metoprolol tartrate (LOPRESSOR) 25 MG tablet Take 25 mg by mouth 2 (two) times daily.   Yes [provider]  Multiple Vitamins-Minerals (MULTIVITAMIN WITH MINERALS) tablet Take 1 tablet by mouth daily.   Yes [provider]  pantoprazole (PROTONIX) 40 MG tablet Take 1 tablet (40 mg total) by mouth daily. 11/13/22  Yes Rai, Ripudeep K, MD  rosuvastatin (CRESTOR) 20 MG tablet TAKE 1 TABLET(20 MG) BY MOUTH DAILY Patient  taking differently: Take 20 mg by mouth daily. 07/09/22  Yes [name], MD  senna-docusate (SENOKOT-S) 8.6-50 MG tablet Take 1 tablet by mouth at bedtime as needed for mild constipation. 11/12/22  Yes Rai, Ripudeep K, MD  sodium chloride 1 g tablet Take 1 tablet (1 g total) by mouth 3 (three) times daily with meals. 11/12/22  Yes Rai, Ripudeep K, MD  diphenhydramine-acetaminophen (TYLENOL PM) 25-500 MG TABS tablet Take 1 tablet by mouth at bedtime as needed (For sleep).    [provider]  docusate sodium (COLACE) 100 MG capsule Take 1 capsule (100 mg total) by mouth daily. 11/13/22   Rai, Ripudeep K, MD  guaiFENesin-dextromethorphan (ROBITUSSIN DM) 100-10 MG/5ML syrup Take 15 mLs by mouth every 4 (four) hours as needed for cough. Patient not  taking: Reported on 11/27/2022 11/12/22   Rai, Delene Ruffini, MD  loperamide (IMODIUM A-D) 2 MG tablet Take 1 tablet (2 mg total) by mouth 2 (two) times daily as needed for diarrhea or loose stools. 09/28/22   Joycelyn Das, MD    Physical Exam: Vitals:   12/17/22 1755 12/17/22 1845 12/17/22 1945 12/17/22 2141  BP:  119/63 129/74   Pulse:  (!) 115 (!) 110   Resp:  (!) 24 (!) 23   Temp: 97.7 F (36.5 C)   98 F (36.7 C)  TempSrc: Oral   Oral  SpO2:  96% 94%   Weight:      Height:       *** Data Reviewed: {Tip this will not be part of the note when signed- Document your independent interpretation of telemetry tracing, EKG, lab, Radiology test or any other diagnostic tests. Add any new diagnostic test ordered today. (Optional):26781} {Results:26384}  Assessment and Plan: No notes have been filed under this hospital service. Service: Hospitalist     Advance Care Planning:   Code Status: Full Code ***  Consults: ***  Family Communication: ***  Severity of Illness: {Observation/Inpatient:21159}  Author: Lilyan Gilford, DO 12/17/2022 10:10 PM  For on call review [email or web address].

## 2022-12-17 NOTE — ED Notes (Signed)
TROP 191 CALLED from lab as a critical/ Primary RN notified

## 2022-12-17 NOTE — ED Notes (Signed)
Pt 85% when ambulating. Pt remained 85% when lying in bed. Pt placed on 2L [place].

## 2022-12-17 NOTE — ED Triage Notes (Signed)
Pt arrives via EMS from [facility] nursing home. EMS reports staff was concerned for altered mental status. Pt denies any complaints. Pt is confused but awake and alert. He denies any complaints. Vitals are stable.

## 2022-12-17 NOTE — ED Provider Notes (Signed)
[place] EMERGENCY DEPARTMENT AT [facility] Provider Note   CSN: [id number] Arrival date & time: 12/17/22  1351     History  Chief Complaint  Patient presents with   Altered Mental Status    Denzel Etienne is a 89 y.o. male.   Altered Mental Status  89 year old male with past medical history of hyperlipidemia, PVD status post bypass grafting, AAA, SCLC presenting for evaluation of altered mental status.  Patient is residing in a nursing facility.  He is confused and cannot contribute much to history.  However, daughter who sees him every day is at bedside.  She says that he has been more confused for approximately the last week.  She says that normally he is very alert and oriented and actually trade stocks on his own.  However, at this point he is now mumbling and very confused.  This is a distinct change for him.  No known fevers.  No chest pain, headaches, abdominal pain, pain with urination, urinary frequency.  However, he has had shortness of breath in recent days as well.  Daughter reports that she thinks he has a diagnosis of atrial fibrillation, but this is not certain.  He is not taking any blood thinners at this time.  No reported falls.    Home Medications Prior to Admission medications   Medication Sig Start Date End Date Taking? Authorizing Provider  aspirin EC 81 MG tablet Take 1 tablet (81 mg total) by mouth daily. Swallow whole. Patient taking differently: Take 81 mg by mouth daily. 06/30/21   O'NealRonnald Ramp, MD  Cholecalciferol (VITAMIN D-3) 125 MCG (5000 UT) TABS Take by mouth.    [provider]  clopidogrel (PLAVIX) 75 MG tablet Take 1 tablet (75 mg total) by mouth daily. 11/13/22   Rai, Delene Ruffini, MD  diltiazem (CARDIZEM CD) 180 MG 24 hr capsule Take 180 mg by mouth daily.    [provider]  diphenhydramine-acetaminophen (TYLENOL PM) 25-500 MG TABS tablet Take 1 tablet by mouth at bedtime as needed (For sleep).    [provider]  docusate sodium (COLACE) 100 MG capsule Take 1 capsule (100 mg total) by mouth daily. 11/13/22   Rai, Ripudeep K, MD  fluticasone (FLONASE) 50 MCG/ACT nasal spray Place 2 sprays into both nostrils daily. 11/13/22   Rai, Ripudeep K, MD  guaiFENesin-dextromethorphan (ROBITUSSIN DM) 100-10 MG/5ML syrup Take 15 mLs by mouth every 4 (four) hours as needed for cough. Patient not taking: Reported on 11/27/2022 11/12/22   Rai, Delene Ruffini, MD  loperamide (IMODIUM A-D) 2 MG tablet Take 1 tablet (2 mg total) by mouth 2 (two) times daily as needed for diarrhea or loose stools. 09/28/22   Pokhrel, Rebekah Chesterfield, MD  loratadine (CLARITIN) 10 MG tablet Take 10 mg by mouth daily.    [provider]  metoprolol tartrate (LOPRESSOR) 25 MG tablet Take 25 mg by mouth 2 (two) times daily.    [provider]  Multiple Vitamins-Minerals (MULTIVITAMIN WITH MINERALS) tablet Take 1 tablet by mouth daily.    [provider]  pantoprazole (PROTONIX) 40 MG tablet Take 1 tablet (40 mg total) by mouth daily. 11/13/22   Rai, Ripudeep K, MD  rosuvastatin (CRESTOR) 20 MG tablet TAKE 1 TABLET(20 MG) BY MOUTH DAILY Patient taking differently: Take 20 mg by mouth daily. 07/09/22   [name], MD  senna-docusate (SENOKOT-S) 8.6-50 MG tablet Take 1 tablet by mouth at bedtime as needed for mild constipation. 11/12/22   Rai,  Ripudeep K, MD  sodium chloride 1 g tablet Take 1 tablet (1 g total) by mouth 3 (three) times daily with meals. 11/12/22   Cathren Harsh, MD      Allergies    Penicillins    Review of Systems   Review of Systems  Physical Exam Updated Vital Signs BP 129/74   Pulse (!) 110   Temp 97.7 F (36.5 C) (Oral)   Resp (!) 23   Ht 5\' 8"  (1.727 m)   Wt 72.6 kg   SpO2 94%   BMI 24.33 kg/m  Physical Exam Constitutional:      General: He is not in acute distress. HENT:     Head: Normocephalic and atraumatic.     Right Ear: External ear normal.     Left Ear: External ear  normal.     Nose: Nose normal.     Mouth/Throat:     Mouth: Mucous membranes are moist.     Pharynx: Oropharynx is clear.  Eyes:     Extraocular Movements: Extraocular movements intact.     Conjunctiva/sclera: Conjunctivae normal.     Pupils: Pupils are equal, round, and reactive to light.  Cardiovascular:     Rate and Rhythm: Tachycardia present. Rhythm irregular.     Pulses: Normal pulses.  Pulmonary:     Effort: Pulmonary effort is normal. No respiratory distress.     Breath sounds: Rhonchi present.     Comments: Rhonchi decreased breath sounds right lung when compared to left Abdominal:     General: Abdomen is flat.     Palpations: Abdomen is soft.     Tenderness: There is no abdominal tenderness. There is no guarding or rebound.  Musculoskeletal:        General: Normal range of motion.     Cervical back: Normal range of motion. No tenderness.     Right lower leg: No edema.     Left lower leg: No edema.  Skin:    General: Skin is warm and dry.     Findings: No rash.  Neurological:     Mental Status: He is alert. He is disoriented.     Cranial Nerves: No cranial nerve deficit.     Sensory: No sensory deficit.     Motor: No weakness.     Comments: Nonsensical responses to questions.  Mumbling words difficult to understand     ED Results / Procedures / Treatments   Labs (all labs ordered are listed, but only abnormal results are displayed) Labs Reviewed  COMPREHENSIVE METABOLIC PANEL - Abnormal; Notable for the following components:      Result Value   Potassium 3.0 (*)    Glucose, Bld 105 (*)    BUN 28 (*)    Total Protein 6.0 (*)    Albumin 2.8 (*)    All other components within normal limits  CBC - Abnormal; Notable for the following components:   WBC 13.4 (*)    RBC 4.02 (*)    Hemoglobin 11.2 (*)    HCT 34.8 (*)    RDW 16.9 (*)    All other components within normal limits  BRAIN NATRIURETIC PEPTIDE - Abnormal; Notable for the following components:   B  Natriuretic Peptide 421.2 (*)    All other components within normal limits  TROPONIN I (HIGH SENSITIVITY) - Abnormal; Notable for the following components:   Troponin I (High Sensitivity) 191 (*)    All other components within normal limits  TROPONIN I (HIGH  SENSITIVITY) - Abnormal; Notable for the following components:   Troponin I (High Sensitivity) 192 (*)    All other components within normal limits  URINALYSIS, W/ REFLEX TO CULTURE (INFECTION SUSPECTED)  CBG MONITORING, ED    EKG EKG Interpretation Date/Time:  Monday December 17 2022 14:11:09 EDT Ventricular Rate:  119 PR Interval:  136 QRS Duration:  90 QT Interval:  288 QTC Calculation: 405 R Axis:   60  Text Interpretation: Sinus tachycardia with Premature atrial complexes in a pattern of bigeminy Left ventricular hypertrophy with repolarization abnormality ( Sokolow-Lyon ) Cannot rule out Septal infarct , age undetermined Abnormal ECG Confirmed by Gerhard Munch [phone]) on 12/17/2022 4:46:07 PM  Radiology DG Chest Portable 1 View  Result Date: 12/17/2022 CLINICAL DATA:  Shortness of breath, altered mental status, history of lung cancer EXAM: PORTABLE CHEST 1 VIEW COMPARISON:  09/26/2022 FINDINGS: Cardiomegaly. Diffuse bilateral interstitial pulmonary opacity. Infrahilar mass of the left lung and probable small, layering left pleural effusion. Osseous structures unremarkable. IMPRESSION: 1. Cardiomegaly with diffuse bilateral interstitial pulmonary opacity, consistent with edema or atypical/viral infection. 2. Infrahilar mass of the left lung and probable small, layering left pleural effusion. Electronically Signed   By: Jearld Lesch M.D.   On: 12/17/2022 17:28   CT Head Wo Contrast  Result Date: 12/17/2022 CLINICAL DATA:  Mental status change, unknown cause. EXAM: CT HEAD WITHOUT CONTRAST TECHNIQUE: Contiguous axial images were obtained from the base of the skull through the vertex without intravenous contrast. RADIATION DOSE  REDUCTION: This exam was performed according to the departmental dose-optimization program which includes automated exposure control, adjustment of the mA and/or kV according to patient size and/or use of iterative reconstruction technique. COMPARISON:  MRI brain 09/07/2022. FINDINGS: Brain: No acute hemorrhage. Unchanged mild chronic small-vessel disease with old lacunar infarct in the medial aspect of the left cerebellar hemisphere. Cortical gray-white differentiation is otherwise preserved. Prominence of the ventricles and sulci within expected range for age. No hydrocephalus or extra-axial collection. No mass effect or midline shift. Vascular: No hyperdense vessel or unexpected calcification. Skull: No calvarial fracture or suspicious bone lesion. Skull base is unremarkable. Sinuses/Orbits: No acute finding. Other: None. IMPRESSION: 1. No acute intracranial abnormality. 2. Unchanged mild chronic small-vessel disease with old lacunar infarct in the medial aspect of the left cerebellar hemisphere. Electronically Signed   By: Orvan Falconer M.D.   On: 12/17/2022 16:49    Procedures Procedures    Medications Ordered in ED Medications  potassium chloride SA (KLOR-CON M) CR tablet 20 mEq (0 mEq Oral Hold 12/17/22 1653)  azithromycin (ZITHROMAX) 500 mg in sodium chloride 0.9 % 250 mL IVPB (500 mg Intravenous New Bag/Given 12/17/22 1935)  cefTRIAXone (ROCEPHIN) 2 g in sodium chloride 0.9 % 100 mL IVPB (0 g Intravenous Stopped 12/17/22 1915)  lactated ringers bolus 1,000 mL (0 mLs Intravenous Stopped 12/17/22 1949)    ED Course/ Medical Decision Making/ A&P                                 Medical Decision Making Amount and/or Complexity of Data Reviewed Labs: ordered. Radiology: ordered.  Risk Prescription drug management. Decision regarding hospitalization.   Chaitanya Amedee is a 89 y.o. male with significant PMH of hyperlipidemia, PVD status post bypass grafting, AAA, SCLC, who presents to the  ED with confusion worsening over the last week.  History provided by daughter at bedside.  On my interview, patient had  an irregularly irregular rhythm on the monitor with heart rate consistently greater than 90.  However, blood pressure was normal patient exhibited no signs of hemodynamic instability.  However, when being triaged patient had oxygen saturation in the mid 80% with good waveform, therefore he was placed on 3 L nasal cannula with improvement in oxygenation.  This is a new oxygen requirement for him.  Differential diagnosis includes infectious (encephalitis, meningitis, pneumonia, UTI, soft tissue), polypharmacy, intoxication, cardiac ischemia, metabolic encephalopathy, CNS insult (ICH, SAH, infarct), seizures, thyroid storm.   EKG showed abnormal rhythm - does appear to have p waves, but is in a bigeminy pattern. This is new when compared to prior. Not a STEMI.    Chest x-ray reviewed and shows new infiltrate within the left lower lobe concerning for consolidation and pneumonia.  He will be started on CAP coverage.  His chart has a penicillin allergy listed.  I asked his daughter about this and she says that he broke out in a rash when he was young and is not sure if this is a real allergy.  It looks like in his chart he has received Rocephin as recently as August of this year.  Feel it is safe to give Rocephin as well as azithromycin in this case.  CT head negative for acute intracranial abnormality such as bleed, mass.  Laboratory workup reviewed.  Patient does have a mild leukocytosis of 13.4.  On metabolic panel, potassium is low at 3.04 she was provided replacement.  No AKI.  BNP is elevated to 421 with no prior to compare to.  Initial troponin was 191, repeat 192. Troponin elevation likely secondary to demand given elevated rate, but will need to be followed nonetheless.   Given findings on chest x-ray, patient most likely has altered mental status secondary to pneumonia.  He will  require admission to the hospitalist team for continued pneumonia treatment with altered mental status.  Handoff provided.   Patient treatment plan discussed with and medical decision making supervised by my attending physician Dr. Jeraldine Loots.    Final Clinical Impression(s) / ED Diagnoses Final diagnoses:  Altered mental status, unspecified altered mental status type    Rx / DC Orders ED Discharge Orders     None         Lyman Speller, MD 12/17/22 2140    Gerhard Munch, MD 12/18/22 1905

## 2022-12-18 DIAGNOSIS — R7989 Other specified abnormal findings of blood chemistry: Secondary | ICD-10-CM | POA: Diagnosis not present

## 2022-12-18 DIAGNOSIS — J9601 Acute respiratory failure with hypoxia: Secondary | ICD-10-CM | POA: Diagnosis not present

## 2022-12-18 DIAGNOSIS — G9341 Metabolic encephalopathy: Secondary | ICD-10-CM | POA: Insufficient documentation

## 2022-12-18 DIAGNOSIS — A4102 Sepsis due to Methicillin resistant Staphylococcus aureus: Secondary | ICD-10-CM | POA: Diagnosis not present

## 2022-12-18 DIAGNOSIS — J189 Pneumonia, unspecified organism: Secondary | ICD-10-CM

## 2022-12-18 DIAGNOSIS — I739 Peripheral vascular disease, unspecified: Secondary | ICD-10-CM | POA: Diagnosis not present

## 2022-12-18 LAB — URINALYSIS, COMPLETE (UACMP) WITH MICROSCOPIC
Bilirubin Urine: NEGATIVE
Glucose, UA: NEGATIVE mg/dL
Ketones, ur: NEGATIVE mg/dL
Leukocytes,Ua: NEGATIVE
Nitrite: NEGATIVE
Protein, ur: NEGATIVE mg/dL
RBC / HPF: >50 RBC/hpf (ref 0–5)
Specific Gravity, Urine: 1.025 (ref 1.005–1.030)
pH: 6 (ref 5.0–8.0)

## 2022-12-18 LAB — CBC WITH DIFFERENTIAL/PLATELET
Abs Immature Granulocytes: 0.08 10*3/uL — ABNORMAL HIGH (ref 0.00–0.07)
Basophils Absolute: 0 10*3/uL (ref 0.0–0.1)
Basophils Relative: 0 %
Eosinophils Absolute: 0 10*3/uL (ref 0.0–0.5)
Eosinophils Relative: 0 %
HCT: 31.5 % — ABNORMAL LOW (ref 39.0–52.0)
Hemoglobin: 10.1 g/dL — ABNORMAL LOW (ref 13.0–17.0)
Immature Granulocytes: 1 %
Lymphocytes Relative: 5 %
Lymphs Abs: 0.5 10*3/uL — ABNORMAL LOW (ref 0.7–4.0)
MCH: 28.1 pg (ref 26.0–34.0)
MCHC: 32.1 g/dL (ref 30.0–36.0)
MCV: 87.5 fL (ref 80.0–100.0)
Monocytes Absolute: 0.8 10*3/uL (ref 0.1–1.0)
Monocytes Relative: 7 %
Neutro Abs: 9 10*3/uL — ABNORMAL HIGH (ref 1.7–7.7)
Neutrophils Relative %: 87 %
Platelets: 243 10*3/uL (ref 150–400)
RBC: 3.6 MIL/uL — ABNORMAL LOW (ref 4.22–5.81)
RDW: 17.2 % — ABNORMAL HIGH (ref 11.5–15.5)
WBC: 10.4 10*3/uL (ref 4.0–10.5)
nRBC: 0 % (ref 0.0–0.2)

## 2022-12-18 LAB — COMPREHENSIVE METABOLIC PANEL
ALT: 16 U/L (ref 0–44)
AST: 22 U/L (ref 15–41)
Albumin: 2.4 g/dL — ABNORMAL LOW (ref 3.5–5.0)
Alkaline Phosphatase: 71 U/L (ref 38–126)
Anion gap: 15 (ref 5–15)
BUN: 26 mg/dL — ABNORMAL HIGH (ref 8–23)
CO2: 21 mmol/L — ABNORMAL LOW (ref 22–32)
Calcium: 8.9 mg/dL (ref 8.9–10.3)
Chloride: 107 mmol/L (ref 98–111)
Creatinine, Ser: 0.96 mg/dL (ref 0.61–1.24)
GFR, Estimated: >60 mL/min (ref >60)
Glucose, Bld: 92 mg/dL (ref 70–99)
Potassium: 3.4 mmol/L — ABNORMAL LOW (ref 3.5–5.1)
Sodium: 143 mmol/L (ref 135–145)
Total Bilirubin: 0.9 mg/dL (ref 0.3–1.2)
Total Protein: 5.4 g/dL — ABNORMAL LOW (ref 6.5–8.1)

## 2022-12-18 LAB — LACTIC ACID, PLASMA: Lactic Acid, Venous: 1.8 mmol/L (ref 0.5–1.9)

## 2022-12-18 LAB — MAGNESIUM: Magnesium: 1.7 mg/dL (ref 1.7–2.4)

## 2022-12-18 LAB — STREP PNEUMONIAE URINARY ANTIGEN: Strep Pneumo Urinary Antigen: NEGATIVE

## 2022-12-18 MED ORDER — POTASSIUM CHLORIDE CRYS ER 20 MEQ PO TBCR
40.0000 meq | EXTENDED_RELEASE_TABLET | Freq: Once | ORAL | Status: AC
  Administered 2022-12-18: 40 meq via ORAL
  Filled 2022-12-18: qty 2

## 2022-12-18 MED ORDER — FUROSEMIDE 10 MG/ML IJ SOLN
40.0000 mg | Freq: Once | INTRAMUSCULAR | Status: AC
  Administered 2022-12-18: 40 mg via INTRAVENOUS
  Filled 2022-12-18: qty 4

## 2022-12-18 MED ORDER — MAGNESIUM SULFATE 2 GM/50ML IV SOLN
2.0000 g | Freq: Once | INTRAVENOUS | Status: AC
  Administered 2022-12-18: 2 g via INTRAVENOUS
  Filled 2022-12-18: qty 50

## 2022-12-18 MED ORDER — HYDRALAZINE HCL 20 MG/ML IJ SOLN: 10.0000 mg | Freq: Four times a day (QID) | INTRAMUSCULAR | Status: DC | PRN

## 2022-12-18 NOTE — Assessment & Plan Note (Addendum)
-   Leukocytosis of 13.4, tachypnea at 32, tachycardia 115, with altered mental status, and respiratory failure with hypoxia - Procalcitonin 0.80 - Chest x-ray shows edema versus atypical pneumonia - Rhonchi bilaterally on exam - Continue Rocephin and Zithromax - Strep and Legionella urine antigens pending - Expectorated sputum pending - Blood cultures ordered but were unfortunately done after antibiotic - Patient was given a 1 L bolus in the ED - Continue NS at 100 mL/h - Continue to monitor

## 2022-12-18 NOTE — ED Notes (Signed)
Pt IV found with catheter partly out, soiled in blood. Removed at this time.

## 2022-12-18 NOTE — ED Notes (Signed)
Pt assisted with the bedpan; brief and sheets changed due to some incontinence of stool.

## 2022-12-18 NOTE — Progress Notes (Signed)
Patient admitted from the ed with diagnosis of sepsis. Patient is alert and oriented x4. Mae'sx 4. 02 at 2l [place]. MP shows nsr. Bottom pink mepiplex applied after chg bath. Patient has thin frail bruised skin. Dressing intact to right groin and patient's daughter states he had vascular surgery. Site intact under dressing and healed. No other skin issues noted.

## 2022-12-18 NOTE — Assessment & Plan Note (Signed)
-   Potassium 3.0 - Replace - Recheck in a.m.

## 2022-12-18 NOTE — Assessment & Plan Note (Signed)
-   Apparently at baseline this patient is very sharp - Multifactorial encephalopathy including infection and hypoxia as well as electrolyte abnormalities - Treat underlying causes and continue to monitor

## 2022-12-18 NOTE — ED Notes (Signed)
ED TO INPATIENT HANDOFF REPORT  ED Nurse Name and Phone #: Laurence Compton #[phone]  S Name/Age/Gender Edward Campos 89 y.o. male Room/Bed: 011C/011C  Code Status   Code Status: Full Code  Home/SNF/Other Nursing Home Patient oriented to: self, place, time, and situation Is this baseline? Yes   Triage Complete: Triage complete  Chief Complaint Sepsis [facility]) [A41.9]  Triage Note Pt arrives via EMS from [place] nursing home. EMS reports staff was concerned for altered mental status. Pt denies any complaints. Pt is confused but awake and alert. He denies any complaints. Vitals are stable.    Allergies Allergies  Allergen Reactions   Penicillins Other (See Comments)    Level of Care/Admitting Diagnosis ED Disposition     ED Disposition  Admit   Condition  --   Comment  Hospital Area: [facility] [[id number]]  Level of Care: Telemetry Medical [104]  May admit patient to [facility] or [facility] if equivalent level of care is available:: No  Covid Evaluation: Symptomatic Person Under Investigation (PUI) or recent exposure (last 10 days) *Testing Required*  Diagnosis: Sepsis [facility]) [[id number]]  Admitting Physician: Lilyan Gilford [[id number]]  Attending Physician: Lilyan Gilford [[id number]]  Certification:: I certify this patient will need inpatient services for at least 2 midnights  Expected Medical Readiness: 12/20/2022          B Medical/Surgery History Past Medical History:  Diagnosis Date   AAA (abdominal aortic aneurysm) (HCC)    Anxiety    Arthritis    BPH (benign prostatic hyperplasia)    Dizziness    Hyperlipidemia    PVD (peripheral vascular disease) (HCC)    S/p fem-fem bypass grafting 08/2022   SCLC (small cell lung carcinoma), left [facility])    Past Surgical History:  Procedure Laterality Date   ABDOMINAL AORTOGRAM W/LOWER EXTREMITY N/A 05/06/2019   Procedure: ABDOMINAL AORTOGRAM W/LOWER EXTREMITY;  Surgeon: Cephus Shelling, MD;   [facility] INVASIVE CV LAB;  Service: Cardiovascular;  Laterality: N/A;   ABDOMINAL WOUND DEHISCENCE N/A 08/22/2022   Procedure: WASHOUT ABDOMEN WOUND AND VAC CHANGE;  Surgeon: Cephus Shelling, MD;  Location: [facility] OR;  Service: Vascular;  Laterality: N/A;   APPLICATION OF WOUND VAC  07/06/2019   Procedure: Application Of Wound Vac Left Groin;  Surgeon: Sherren Kerns, MD;  Location: [facility] OR;  Service: Vascular;;   APPLICATION OF WOUND VAC N/A 08/16/2022   Procedure: APPLICATION OF WOUND VAC;  Surgeon: [name], MD;  Location: [facility] OR;  Service: Vascular;  Laterality: N/A;   APPLICATION OF WOUND VAC N/A 10/31/2022   Procedure: APPLICATION OF WOUND VAC;  Surgeon: Leonie Douglas, MD;  Location: [facility] OR;  Service: Vascular;  Laterality: N/A;   BRONCHIAL BIOPSY  08/21/2022   Procedure: BRONCHIAL BIOPSIES;  Surgeon: Oretha Milch, MD;  Location: [facility] ENDOSCOPY;  Service: Cardiopulmonary;;   BRONCHIAL BRUSHINGS  08/21/2022   Procedure: BRONCHIAL BRUSHINGS;  Surgeon: Oretha Milch, MD;  Location: [facility] ENDOSCOPY;  Service: Cardiopulmonary;;   BRONCHIAL WASHINGS  08/21/2022   Procedure: BRONCHIAL WASHINGS;  Surgeon: Oretha Milch, MD;  Location: [facility] ENDOSCOPY;  Service: Cardiopulmonary;;   CARDIOVERSION N/A 11/03/2020   Procedure: CARDIOVERSION;  Surgeon: Thurmon Fair, MD;  Location: [facility] ENDOSCOPY;  Service: Cardiovascular;  Laterality: N/A;   ENDARTERECTOMY FEMORAL Bilateral 06/08/2019   Procedure: ENDARTERECTOMY RIGHT COMMON FEMORAL; ENDARTERECTOMY RIGHT EXTERNAL ILIAC; ENDARTERECTOMY LEFT COMMON FEMORAL; ENDARTERECTOMY LEFT EXTERNAL ILIAC;  Surgeon: Sherren Kerns, MD;  Location: [facility] OR;  Service: Vascular;  Laterality: Bilateral;   FEMORAL-FEMORAL BYPASS GRAFT Bilateral 06/08/2019   Procedure: BYPASS GRAFT FEMORAL-FEMORAL ARTERY;  Surgeon: Sherren Kerns, MD;  Location: [facility] OR;  Service: Vascular;  Laterality: Bilateral;   FEMORAL-FEMORAL BYPASS GRAFT Bilateral 10/25/2022   Procedure:  EXCISION BYPASS GRAFT FEMORAL-FEMORAL ARTERY AND REDO FEMORAL-FEMORAL BYPASS GRAFT USING CRYO-SAPHENOUS VEIN [id number];  Surgeon: Victorino Sparrow, MD;  Location: [facility] OR;  Service: Vascular;  Laterality: Bilateral;   GROIN DEBRIDEMENT Left 07/06/2019   Procedure: INCISION AND DRAINAGE OF LEFT GROIN;  Surgeon: Sherren Kerns, MD;  Location: [facility] OR;  Service: Vascular;  Laterality: Left;   growth removed from chest     HEMORRHOID SURGERY     HEMOSTASIS CONTROL  08/21/2022   Procedure: HEMOSTASIS CONTROL;  Surgeon: Oretha Milch, MD;  Location: [facility] ENDOSCOPY;  Service: Cardiopulmonary;;   HERNIA REPAIR  12 yrs ago   left inguinal hernia   I & D EXTREMITY Bilateral 10/31/2022   Procedure: BILATERAL GROIN WASHOUT AND MUSCLE FLAPS;  Surgeon: Leonie Douglas, MD;  [facility] OR;  Service: Vascular;  Laterality: Bilateral;   INCISE AND DRAIN ABCESS Left 07/06/2019   Incision and drainage left groin placement of VAC dressing   INCISION AND DRAINAGE OF WOUND Right 08/16/2022   Procedure: IRRIGATION AND DEBRIDEMENT WOUND;  Surgeon: [name], MD;  Location: [facility] OR;  Service: Vascular;  Laterality: Right;   INGUINAL HERNIA REPAIR  10/16/2010   Procedure: HERNIA REPAIR INGUINAL ADULT;  Surgeon: Dalia Heading;  Location: [facility] ORS;  Service: General;  Laterality: Right;   INSERTION OF ILIAC STENT Right 06/08/2019   Procedure: INSERTION OF RIGHT COMMON ILIAC AND EXTERNAL ILICAC STENT;  Surgeon: Sherren Kerns, MD;  Location: [facility] OR;  Service: Vascular;  Laterality: Right;   PATCH ANGIOPLASTY Right 06/08/2019   Procedure: Patch Angioplasty Right Common Femoral;  Surgeon: Sherren Kerns, MD;  Location[facility] OR;  Service: Vascular;  Laterality: Right;   TEE WITHOUT CARDIOVERSION N/A 11/07/2022   Procedure: TRANSESOPHAGEAL ECHOCARDIOGRAM;  Surgeon: Jodelle Red, MD;  Location: [facility] INVASIVE CV LAB;  Service: Cardiovascular;  Laterality: N/A;   VIDEO BRONCHOSCOPY N/A 08/21/2022   Procedure:  VIDEO BRONCHOSCOPY WITH FLUORO;  Surgeon: Oretha Milch, MD;  Location: [facility] ENDOSCOPY;  Service: Cardiopulmonary;  Laterality: N/A;     A IV Location/Drains/Wounds Patient Lines/Drains/Airways Status     Active Line/Drains/Airways     Name Placement date Placement time Site Days   Peripheral IV 12/17/22 18 G Anterior;Distal;Left;Upper Arm 12/17/22  1402  Arm  1   Negative Pressure Wound Therapy Groin Right;Left 10/31/22  1807  --  48   Wound / Incision (Open or Dehisced) 11/03/22 Incision - Open Hip Anterior;Right 11/03/22  1738  Hip  45            Intake/Output Last 24 hours  Intake/Output Summary (Last 24 hours) at 12/18/2022 1449 Last data filed at 12/18/2022 1429 Gross per 24 hour  Intake 1682.36 ml  Output --  Net 1682.36 ml    Labs/Imaging Results for orders placed or performed during the hospital encounter of 12/17/22 (from the past 48 hour(s))  CBG monitoring, ED     Status: None   Collection Time: 12/17/22  1:59 PM  Result Value Ref Range   Glucose-Capillary 98 70 - 99 mg/dL    Comment: Glucose reference range applies only to samples taken after fasting for at least 8 hours.  Comprehensive metabolic panel     Status:  Abnormal   Collection Time: 12/17/22  2:28 PM  Result Value Ref Range   Sodium 143 135 - 145 mmol/L   Potassium 3.0 (L) 3.5 - 5.1 mmol/L   Chloride 107 98 - 111 mmol/L   CO2 24 22 - 32 mmol/L   Glucose, Bld 105 (H) 70 - 99 mg/dL    Comment: Glucose reference range applies only to samples taken after fasting for at least 8 hours.   BUN 28 (H) 8 - 23 mg/dL   Creatinine, Ser 1.61 0.61 - 1.24 mg/dL   Calcium 9.5 8.9 - 09.6 mg/dL   Total Protein 6.0 (L) 6.5 - 8.1 g/dL   Albumin 2.8 (L) 3.5 - 5.0 g/dL   AST 23 15 - 41 U/L   ALT 17 0 - 44 U/L   Alkaline Phosphatase 93 38 - 126 U/L   Total Bilirubin 0.9 0.3 - 1.2 mg/dL   GFR, Estimated >04 >54 mL/min    Comment: (NOTE) Calculated using the CKD-EPI Creatinine Equation (2021)    Anion gap 12 5 - 15     Comment: Performed at [facility] Lab, [address] [place], [place] [address]  CBC     Status: Abnormal   Collection Time: 12/17/22  2:28 PM  Result Value Ref Range   WBC 13.4 (H) 4.0 - 10.5 K/uL   RBC 4.02 (L) 4.22 - 5.81 MIL/uL   Hemoglobin 11.2 (L) 13.0 - 17.0 g/dL   HCT 91.4 (L) 78.2 - 95.6 %   MCV 86.6 80.0 - 100.0 fL   MCH 27.9 26.0 - 34.0 pg   MCHC 32.2 30.0 - 36.0 g/dL   RDW 21.3 (H) 08.6 - 57.8 %   Platelets 305 150 - 400 K/uL   nRBC 0.0 0.0 - 0.2 %    Comment: Performed at [facility] Lab, [address], [place], [place] [address]  Troponin I (High Sensitivity)     Status: Abnormal   Collection Time: 12/17/22  4:27 PM  Result Value Ref Range   Troponin I (High Sensitivity) 191 (HH) <18 ng/L    Comment: CRITICAL RESULT CALLED TO, READ BACK BY AND VERIFIED WITH Randall Hiss, RN AT 1808 10.07.24 D. BLU (NOTE) Elevated high sensitivity troponin I (hsTnI) values and significant  changes across serial measurements may suggest ACS but many other  chronic and acute conditions are known to elevate hsTnI results.  Refer to the "Links" section for chest pain algorithms and additional  guidance. Performed at [facility] Lab, [address], [place], [place] [address]   Brain natriuretic peptide     Status: Abnormal   Collection Time: 12/17/22  4:27 PM  Result Value Ref Range   B Natriuretic Peptide 421.2 (H) 0.0 - 100.0 pg/mL    Comment: Performed at [facility] Lab, [address] [place], [place] [address]  Troponin I (High Sensitivity)     Status: Abnormal   Collection Time: 12/17/22  6:45 PM  Result Value Ref Range   Troponin I (High Sensitivity) 192 (HH) <18 ng/L    Comment: CRITICAL VALUE NOTED. VALUE IS CONSISTENT WITH PREVIOUSLY REPORTED/CALLED VALUE (NOTE) Elevated high sensitivity troponin I (hsTnI) values and significant  changes across serial measurements may suggest ACS but many other  chronic and acute conditions are known to elevate hsTnI  results.  Refer to the "Links" section for chest pain algorithms and additional  guidance. Performed at [facility] Lab, [address], [place], [place] [address]  Troponin I (High Sensitivity)     Status: Abnormal   Collection Time: 12/17/22  9:02 PM  Result Value Ref Range   Troponin I (High Sensitivity) 181 (HH) <18 ng/L    Comment: CRITICAL VALUE NOTED. VALUE IS CONSISTENT WITH PREVIOUSLY REPORTED/CALLED VALUE (NOTE) Elevated high sensitivity troponin I (hsTnI) values and significant  changes across serial measurements may suggest ACS but many other  chronic and acute conditions are known to elevate hsTnI results.  Refer to the "Links" section for chest pain algorithms and additional  guidance. Performed at [facility] Lab, [address], [place], [place] [address]   Procalcitonin     Status: None   Collection Time: 12/17/22  9:02 PM  Result Value Ref Range   Procalcitonin 0.80 ng/mL    Comment:        Interpretation: PCT > 0.5 ng/mL and <= 2 ng/mL: Systemic infection (sepsis) is possible, but other conditions are known to elevate PCT as well. (NOTE)       Sepsis PCT Algorithm           Lower Respiratory Tract                                      Infection PCT Algorithm    ----------------------------     ----------------------------         PCT < 0.25 ng/mL                PCT < 0.10 ng/mL          Strongly encourage             Strongly discourage   discontinuation of antibiotics    initiation of antibiotics    ----------------------------     -----------------------------       PCT 0.25 - 0.50 ng/mL            PCT 0.10 - 0.25 ng/mL               OR       >80% decrease in PCT            Discourage initiation of                                            antibiotics      Encourage discontinuation           of antibiotics    ----------------------------     -----------------------------         PCT >= 0.50 ng/mL              PCT 0.26 - 0.50 ng/mL                 AND       <80% decrease in PCT             Encourage initiation of                                             antibiotics       Encourage continuation           of antibiotics    ----------------------------     -----------------------------  PCT >= 0.50 ng/mL                  PCT > 0.50 ng/mL               AND         increase in PCT                  Strongly encourage                                      initiation of antibiotics    Strongly encourage escalation           of antibiotics                                     -----------------------------                                           PCT <= 0.25 ng/mL                                                 OR                                        > 80% decrease in PCT                                      Discontinue / Do not initiate                                             antibiotics  Performed at [facility] Lab, [address] [place], [place] [address]   Lactic acid, plasma     Status: None   Collection Time: 12/17/22  9:02 PM  Result Value Ref Range   Lactic Acid, Venous 1.9 0.5 - 1.9 mmol/L    Comment: Performed at [facility] Lab, [address], [place], [place] [address]  SARS Coronavirus 2 by RT PCR (hospital order, performed in [facility] hospital lab) *cepheid single result test* Anterior Nasal Swab     Status: None   Collection Time: 12/17/22  9:02 PM   Specimen: Anterior Nasal Swab  Result Value Ref Range   SARS Coronavirus 2 by RT PCR NEGATIVE NEGATIVE    Comment: Performed at [facility] Lab, [address] [place], [place] [address]  Blood gas, venous     Status: Abnormal   Collection Time: 12/17/22  9:35 PM  Result Value Ref Range   pH, Ven 7.43 7.25 - 7.43   pCO2, Ven 37 (L) 44 - 60 mmHg   pO2, Ven 42 32 - 45 mmHg   Bicarbonate 24.6 20.0 - 28.0 mmol/L   Acid-Base Excess 0.5 0.0 - 2.0 mmol/L   O2  Saturation 63.3 %   Patient temperature 37.0     Comment: Performed at [facility] Lab, [address], [place], [place] [address]  Culture, blood (routine x 2) Call MD if unable to obtain prior to antibiotics being given     Status: None (Preliminary result)   Collection Time: 12/17/22 10:58 PM   Specimen: BLOOD  Result Value Ref Range   Specimen Description BLOOD RIGHT ANTECUBITAL    Special Requests      BOTTLES DRAWN AEROBIC AND ANAEROBIC Blood Culture results may not be optimal due to an excessive volume of blood received in culture bottles   Culture      NO GROWTH < 12 HOURS Performed at [facility] Lab, [address] [place], [place] [address]    Report Status PENDING   Culture, blood (routine x 2) Call MD if unable to obtain prior to antibiotics being given     Status: None (Preliminary result)   Collection Time: 12/17/22 10:58 PM   Specimen: BLOOD LEFT FOREARM  Result Value Ref Range   Specimen Description BLOOD LEFT FOREARM    Special Requests      BOTTLES DRAWN AEROBIC AND ANAEROBIC Blood Culture results may not be optimal due to an excessive volume of blood received in culture bottles   Culture      NO GROWTH < 12 HOURS Performed at [facility] Lab, [address] [place], [place] [address]    Report Status PENDING   Lactic acid, plasma     Status: None   Collection Time: 12/18/22  3:18 AM  Result Value Ref Range   Lactic Acid, Venous 1.8 0.5 - 1.9 mmol/L    Comment: Performed at [facility] Lab, [address], [place], [place] [address]  Comprehensive metabolic panel     Status: Abnormal   Collection Time: 12/18/22  3:18 AM  Result Value Ref Range   Sodium 143 135 - 145 mmol/L   Potassium 3.4 (L) 3.5 - 5.1 mmol/L   Chloride 107 98 - 111 mmol/L   CO2 21 (L) 22 - 32 mmol/L   Glucose, Bld 92 70 - 99 mg/dL    Comment: Glucose reference range applies only to samples taken after fasting for at least 8 hours.   BUN 26 (H) 8 - 23 mg/dL   Creatinine, Ser 6.57 0.61 - 1.24 mg/dL   Calcium 8.9 8.9 - 84.6 mg/dL   Total Protein 5.4 (L)  6.5 - 8.1 g/dL   Albumin 2.4 (L) 3.5 - 5.0 g/dL   AST 22 15 - 41 U/L   ALT 16 0 - 44 U/L   Alkaline Phosphatase 71 38 - 126 U/L   Total Bilirubin 0.9 0.3 - 1.2 mg/dL   GFR, Estimated >96 >29 mL/min    Comment: (NOTE) Calculated using the CKD-EPI Creatinine Equation (2021)    Anion gap 15 5 - 15    Comment: Performed at [facility] Lab, [address], [place], [place] [address]  Magnesium     Status: None   Collection Time: 12/18/22  3:18 AM  Result Value Ref Range   Magnesium 1.7 1.7 - 2.4 mg/dL    Comment: Performed at [facility] Lab, [address] [place], [place] [address]  CBC with Differential/Platelet     Status: Abnormal   Collection Time: 12/18/22  3:18 AM  Result Value Ref Range   WBC 10.4 4.0 - 10.5 K/uL   RBC 3.60 (L) 4.22 -  5.81 MIL/uL   Hemoglobin 10.1 (L) 13.0 - 17.0 g/dL   HCT 46.9 (L) 62.9 - 52.8 %   MCV 87.5 80.0 - 100.0 fL   MCH 28.1 26.0 - 34.0 pg   MCHC 32.1 30.0 - 36.0 g/dL   RDW 41.3 (H) 24.4 - 01.0 %   Platelets 243 150 - 400 K/uL   nRBC 0.0 0.0 - 0.2 %   Neutrophils Relative % 87 %   Neutro Abs 9.0 (H) 1.7 - 7.7 K/uL   Lymphocytes Relative 5 %   Lymphs Abs 0.5 (L) 0.7 - 4.0 K/uL   Monocytes Relative 7 %   Monocytes Absolute 0.8 0.1 - 1.0 K/uL   Eosinophils Relative 0 %   Eosinophils Absolute 0.0 0.0 - 0.5 K/uL   Basophils Relative 0 %   Basophils Absolute 0.0 0.0 - 0.1 K/uL   Immature Granulocytes 1 %   Abs Immature Granulocytes 0.08 (H) 0.00 - 0.07 K/uL    Comment: Performed at [facility] Lab, [address] [place], [place] [address]  Urinalysis, Complete w Microscopic -Urine, Clean Catch     Status: Abnormal   Collection Time: 12/18/22 10:20 AM  Result Value Ref Range   Color, Urine YELLOW YELLOW   APPearance HAZY (A) CLEAR   Specific Gravity, Urine 1.025 1.005 - 1.030   pH 6.0 5.0 - 8.0   Glucose, UA NEGATIVE NEGATIVE mg/dL   Hgb urine dipstick MODERATE (A) NEGATIVE   Bilirubin Urine NEGATIVE NEGATIVE   Ketones, ur  NEGATIVE NEGATIVE mg/dL   Protein, ur NEGATIVE NEGATIVE mg/dL   Nitrite NEGATIVE NEGATIVE   Leukocytes,Ua NEGATIVE NEGATIVE   Squamous Epithelial / HPF 0-5 0 - 5 /HPF    Comment: LESS THAN 10 mL OF URINE SUBMITTED   WBC, UA 0-5 0 - 5 WBC/hpf    Comment: LESS THAN 10 mL OF URINE SUBMITTED   RBC / HPF >50 0 - 5 RBC/hpf    Comment: LESS THAN 10 mL OF URINE SUBMITTED   Bacteria, UA RARE (A) NONE SEEN    Comment: LESS THAN 10 mL OF URINE SUBMITTED   Ca Oxalate Crys, UA PRESENT     Comment: LESS THAN 10 mL OF URINE SUBMITTED Performed at [facility] Lab, [address] [place], [place] [address]   Strep pneumoniae urinary antigen     Status: None   Collection Time: 12/18/22  1:44 PM  Result Value Ref Range   Strep Pneumo Urinary Antigen NEGATIVE NEGATIVE    Comment:        Infection due to S. pneumoniae cannot be absolutely ruled out since the antigen present may be below the detection limit of the test. Performed at [facility] Lab, [address] [place], [place] [address]    DG Chest Portable 1 View  Result Date: 12/17/2022 CLINICAL DATA:  Shortness of breath, altered mental status, history of lung cancer EXAM: PORTABLE CHEST 1 VIEW COMPARISON:  09/26/2022 FINDINGS: Cardiomegaly. Diffuse bilateral interstitial pulmonary opacity. Infrahilar mass of the left lung and probable small, layering left pleural effusion. Osseous structures unremarkable. IMPRESSION: 1. Cardiomegaly with diffuse bilateral interstitial pulmonary opacity, consistent with edema or atypical/viral infection. 2. Infrahilar mass of the left lung and probable small, layering left pleural effusion. Electronically Signed   By: Jearld Lesch M.D.   On: 12/17/2022 17:28   CT Head Wo Contrast  Result Date: 12/17/2022 CLINICAL DATA:  Mental status change, unknown cause. EXAM: CT HEAD WITHOUT CONTRAST TECHNIQUE: Contiguous axial  images were obtained from the base of the skull through the vertex without intravenous  contrast. RADIATION DOSE REDUCTION: This exam was performed according to the departmental dose-optimization program which includes automated exposure control, adjustment of the mA and/or kV according to patient size and/or use of iterative reconstruction technique. COMPARISON:  MRI brain 09/07/2022. FINDINGS: Brain: No acute hemorrhage. Unchanged mild chronic small-vessel disease with old lacunar infarct in the medial aspect of the left cerebellar hemisphere. Cortical gray-white differentiation is otherwise preserved. Prominence of the ventricles and sulci within expected range for age. No hydrocephalus or extra-axial collection. No mass effect or midline shift. Vascular: No hyperdense vessel or unexpected calcification. Skull: No calvarial fracture or suspicious bone lesion. Skull base is unremarkable. Sinuses/Orbits: No acute finding. Other: None. IMPRESSION: 1. No acute intracranial abnormality. 2. Unchanged mild chronic small-vessel disease with old lacunar infarct in the medial aspect of the left cerebellar hemisphere. Electronically Signed   By: Orvan Falconer M.D.   On: 12/17/2022 16:49    Pending Labs Unresulted Labs (From admission, onward)     Start     Ordered   12/19/22 0500  CBC  Tomorrow morning,   R        12/18/22 1049   12/19/22 0500  Basic metabolic panel  Tomorrow morning,   R        12/18/22 1049   12/19/22 0500  Magnesium  Tomorrow morning,   R        12/18/22 1049   12/17/22 2245  Expectorated Sputum Assessment w Gram Stain, Rflx to Resp Cult  (COPD / Pneumonia / Cellulitis / Lower Extremity Wound)  Once,   R        12/17/22 2244   12/17/22 2245  Legionella Pneumophila Serogp 1 Ur Ag  (COPD / Pneumonia / Cellulitis / Lower Extremity Wound)  Once,   R        12/17/22 2244            Vitals/Pain Today's Vitals   12/18/22 1400 12/18/22 1415 12/18/22 1447 12/18/22 1448  BP:      Pulse: 85 84 75   Resp:   17   Temp:    98.2 F (36.8 C)  TempSrc:    Oral  SpO2: 97%  97% 95%   Weight:      Height:      PainSc:        Isolation Precautions Airborne and Contact precautions  Medications Medications  aspirin EC tablet 81 mg (81 mg Oral Given 12/18/22 0938)  diltiazem (CARDIZEM CD) 24 hr capsule 180 mg (180 mg Oral Given 12/18/22 0937)  metoprolol tartrate (LOPRESSOR) tablet 25 mg (25 mg Oral Given 12/18/22 0937)  rosuvastatin (CRESTOR) tablet 20 mg (20 mg Oral Given 12/18/22 1027)  pantoprazole (PROTONIX) EC tablet 40 mg (40 mg Oral Given 12/18/22 0937)  clopidogrel (PLAVIX) tablet 75 mg (75 mg Oral Given 12/18/22 0937)  cefTRIAXone (ROCEPHIN) 2 g in sodium chloride 0.9 % 100 mL IVPB (has no administration in time range)  azithromycin (ZITHROMAX) 500 mg in sodium chloride 0.9 % 250 mL IVPB (has no administration in time range)  heparin injection 5,000 Units (5,000 Units Subcutaneous Given 12/18/22 0516)  acetaminophen (TYLENOL) tablet 650 mg (has no administration in time range)    Or  acetaminophen (TYLENOL) suppository 650 mg (has no administration in time range)  oxyCODONE (Oxy IR/ROXICODONE) immediate release tablet 5 mg (has no administration in time range)  ondansetron (ZOFRAN) tablet 4 mg (has no  administration in time range)    Or  ondansetron (ZOFRAN) injection 4 mg (has no administration in time range)  albuterol (PROVENTIL) (2.5 MG/3ML) 0.083% nebulizer solution 2.5 mg (has no administration in time range)  potassium chloride SA (KLOR-CON M) CR tablet 40 mEq (has no administration in time range)  hydrALAZINE (APRESOLINE) injection 10 mg (has no administration in time range)  cefTRIAXone (ROCEPHIN) 2 g in sodium chloride 0.9 % 100 mL IVPB (0 g Intravenous Stopped 12/17/22 1915)  azithromycin (ZITHROMAX) 500 mg in sodium chloride 0.9 % 250 mL IVPB (0 mg Intravenous Stopped 12/17/22 2035)  lactated ringers bolus 1,000 mL (0 mLs Intravenous Stopped 12/17/22 1949)  potassium chloride 10 mEq in 100 mL IVPB (0 mEq Intravenous Stopped 12/18/22 0115)   potassium chloride SA (KLOR-CON M) CR tablet 40 mEq (40 mEq Oral Given 12/18/22 0807)  furosemide (LASIX) injection 40 mg (40 mg Intravenous Given 12/18/22 1257)  magnesium sulfate IVPB 2 g 50 mL (0 g Intravenous Stopped 12/18/22 1429)    Mobility walks with device     Focused Assessments Neuro Assessment Handoff:  Swallow screen pass? Yes  Cardiac Rhythm: Atrial fibrillation       Neuro Assessment: Exceptions to WDL Neuro Checks:      Has TPA been given? No If patient is a Neuro Trauma and patient is going to OR before floor call report to 4N Charge nurse: [phone] or [phone]   R Recommendations: See Admitting Provider Note  Report given to:   Additional Notes: Patient will be sent to the floor when bed is marked ready/clean

## 2022-12-18 NOTE — Assessment & Plan Note (Signed)
-   Hypoxia down to mid 80s on room air - Requiring 3 L nasal cannula - Likely pneumonia on chest x-ray - Rhonchi on exam - Leukocytosis - Albuterol as needed - Treat pneumonia and wean off O2 as tolerated - Continue to monitor

## 2022-12-18 NOTE — Assessment & Plan Note (Signed)
-   Continue aspirin, Plavix, Crestor 

## 2022-12-18 NOTE — Progress Notes (Signed)
[facility] PROGRESS NOTE   Edward Campos ZOX:[id number] DOB: 10/01/1932 DOA: 12/17/2022  PCP: Patient, No Pcp Per  Brief History: 89 y.o. male with medical history significant of AAA, anxiety, hyperlipidemia, peripheral vascular disease, small cell lung carcinoma, atrial flutter and more presented to the ED with a chief complaint of altered mental status.  He is from a nursing facility and staff there reported onset of shortness of breath over the last few days.  Consultants: None yet  Procedures: None    Subjective/Interval History: Patient is distracted.  He denies any chest pain.  Does admit to cough and some shortness of breath.  No nausea vomiting.  Complains of constipation.    Assessment/Plan:  Acute metabolic encephalopathy Apparently at baseline patient has normal mentation.  Reason for his encephalopathy is not entirely clear.  No focal neurological deficits noted on examination.  Could be due to his acute illness.  Reorient daily.  PT and OT evaluation.  Acute respiratory failure with hypoxia/community-acquired pneumonia/sepsis, present on admission Noted to saturate in the 80s on room air.  Requiring 2 to 3 L of oxygen by nasal cannula.  Chest x-ray suggested pulmonary edema versus pneumonia. There was concern for sepsis at the time of admission with leukocytosis tachypnea tachycardia altered mental status. COVID-19 PCR negative.  Procalcitonin 0.8.  Lactic acid level was normal. Patient started on ceftriaxone and azithromycin. BNP was noted to be elevated. He had a echocardiogram and TEE back in August which showed normal systolic function.  Grade 1 diastolic dysfunction was noted.  There could be an element of fluid overload as well.  Will give him a dose of Lasix.    Elevated troponin Recent echocardiogram showed normal systolic function.  Admitting provider discussed with cardiology who read that this could be demand ischemia.  EKG shows nonspecific  findings.  Patient denies any chest pain.  Will repeat EKG.  At this time there is no clear indication to repeat the echocardiogram.  Continue with aspirin and Plavix.  Suprapubic tenderness Could be retaining urine.  Bladder scan. He had a recent CT scan of the abdomen pelvis in August which did not show any abnormality in the GU system.  History of small cell lung cancer Followed by oncology in [place].  Chest x-ray showed mass in the left lung.  He has not yet been started on treatment for his cancer.  He has had multiple hospitalizations resulting in delay in treatment.  Recent MRSA bacteremia and infected right femoral to femoral dacryon bypass status post excision. Looks like he recently completed 6 weeks of treatment with daptomycin.  He was being followed by infectious disease.  Also being followed by vascular surgery.  Peripheral artery disease Continue aspirin Plavix and statin.  Hypokalemia Will be repleted.  Magnesium 1.7.  Normocytic anemia Mild drop in hemoglobin is likely dilutional.  No evidence for bleeding.  History of atrial flutter Oral diltiazem.  Not on anticoagulation.  Noted to be on aspirin and Plavix.  DVT Prophylaxis: Subcutaneous heparin Code Status: Full code Family Communication: Discussed with patient.  No family at bedside [facility]position Plan[facility]  Anticipate discharge back to nursing facility when stable  Status is: Inpatient Remains inpatient appropriate because: Pneumonia, acute respiratory failure      Medications: Scheduled:  aspirin EC  81 mg Oral Daily   clopidogrel  75 mg Oral Daily   diltiazem  180 mg Oral Daily   heparin  5,000 Units Subcutaneous Q8H  metoprolol tartrate  25 mg Oral BID   pantoprazole  40 mg Oral Daily   rosuvastatin  20 mg Oral Daily   Continuous:  azithromycin     cefTRIAXone (ROCEPHIN)  IV     ZOX:[id number] **OR** acetaminophen, albuterol, ondansetron **OR** ondansetron  (ZOFRAN) IV, oxyCODONE  Antibiotics: Anti-infectives (From admission, onward)    Start     Dose/Rate Route Frequency Ordered Stop   12/18/22 1900  cefTRIAXone (ROCEPHIN) 2 g in sodium chloride 0.9 % 100 mL IVPB        2 g 200 mL/hr over 30 Minutes Intravenous Every 24 hours 12/17/22 2244 12/23/22 1859   12/18/22 1900  azithromycin (ZITHROMAX) 500 mg in sodium chloride 0.9 % 250 mL IVPB        500 mg 250 mL/hr over 60 Minutes Intravenous Every 24 hours 12/17/22 2244 12/23/22 1859   12/17/22 1815  cefTRIAXone (ROCEPHIN) 2 g in sodium chloride 0.9 % 100 mL IVPB        2 g 200 mL/hr over 30 Minutes Intravenous  Once 12/17/22 1804 12/17/22 1915   12/17/22 1815  azithromycin (ZITHROMAX) 500 mg in sodium chloride 0.9 % 250 mL IVPB        500 mg 250 mL/hr over 60 Minutes Intravenous  Once 12/17/22 1804 12/17/22 2035       Objective:  Vital Signs  Vitals:   12/18/22 0645 12/18/22 0803 12/18/22 0944 12/18/22 1000  BP: (!) 148/89 (!) 155/106 (!) 178/77 (!) 173/75  Pulse: 99 87 (!) 106 (!) 102  Resp: 20 18 20  (!) 25  Temp:  98 F (36.7 C)    TempSrc:  Oral    SpO2: 96% 96% 100% 95%  Weight:      Height:        Intake/Output Summary (Last 24 hours) at 12/18/2022 1035 Last data filed at 12/18/2022 0014 Gross per 24 hour  Intake 1632.36 ml  Output --  Net 1632.36 ml   Filed Weights   12/17/22 1533  Weight: 72.6 kg    General appearance: Awake alert.  In no distress.  Distracted Resp: Tachypnea.  No use of accessory muscles.  Crackles bilateral bases.  No wheezing or rhonchi. Cardio: S1-S2 is normal regular.  No S3-S4.  No rubs murmurs or bruit GI: Abdomen is soft.  Tender in the suprapubic area without any rebound rigidity or guarding.  No masses organomegaly.  Fullness appreciated. Extremities: No edema.  Moving all of his extremities.  Physical deconditioning is noted. Neurologic: No focal neurological deficits.    Lab Results:  Data Reviewed: I have personally reviewed  following labs and reports of the imaging studies  CBC: Recent Labs  Lab 12/17/22 1428 12/18/22 0318  WBC 13.4* 10.4  NEUTROABS  --  9.0*  HGB 11.2* 10.1*  HCT 34.8* 31.5*  MCV 86.6 87.5  PLT 305 243    Basic Metabolic Panel: Recent Labs  Lab 12/17/22 1428 12/18/22 0318  NA 143 143  K 3.0* 3.4*  CL 107 107  CO2 24 21*  GLUCOSE 105* 92  BUN 28* 26*  CREATININE 1.00 0.96  CALCIUM 9.5 8.9  MG  --  1.7    GFR: Estimated Creatinine Clearance: 51.5 mL/min (by C-G formula based on SCr of 0.96 mg/dL).  Liver Function Tests: Recent Labs  Lab 12/17/22 1428 12/18/22 0318  AST 23 22  ALT 17 16  ALKPHOS 93 71  BILITOT 0.9 0.9  PROT 6.0* 5.4*  ALBUMIN 2.8* 2.4*  CBG: Recent Labs  Lab 12/17/22 1359  GLUCAP 98     Recent Results (from the past 240 hour(s))  SARS Coronavirus 2 by RT PCR (hospital order, performed in [facility] hospital lab) *cepheid single result test* Anterior Nasal Swab     Status: None   Collection Time: 12/17/22  9:02 PM   Specimen: Anterior Nasal Swab  Result Value Ref Range Status   SARS Coronavirus 2 by RT PCR NEGATIVE NEGATIVE Final    Comment: Performed at [facility], [address] [place], [place] [address]      Radiology Studies: DG Chest Portable 1 View  Result Date: 12/17/2022 CLINICAL DATA:  Shortness of breath, altered mental status, history of lung cancer EXAM: PORTABLE CHEST 1 VIEW COMPARISON:  09/26/2022 FINDINGS: Cardiomegaly. Diffuse bilateral interstitial pulmonary opacity. Infrahilar mass of the left lung and probable small, layering left pleural effusion. Osseous structures unremarkable. IMPRESSION: 1. Cardiomegaly with diffuse bilateral interstitial pulmonary opacity, consistent with edema or atypical/viral infection. 2. Infrahilar mass of the left lung and probable small, layering left pleural effusion. Electronically Signed   By: Jearld Lesch M.D.   On: 12/17/2022 17:28   CT Head Wo Contrast  Result Date:  12/17/2022 CLINICAL DATA:  Mental status change, unknown cause. EXAM: CT HEAD WITHOUT CONTRAST TECHNIQUE: Contiguous axial images were obtained from the base of the skull through the vertex without intravenous contrast. RADIATION DOSE REDUCTION: This exam was performed according to the departmental dose-optimization program which includes automated exposure control, adjustment of the mA and/or kV according to patient size and/or use of iterative reconstruction technique. COMPARISON:  MRI brain 09/07/2022. FINDINGS: Brain: No acute hemorrhage. Unchanged mild chronic small-vessel disease with old lacunar infarct in the medial aspect of the left cerebellar hemisphere. Cortical gray-white differentiation is otherwise preserved. Prominence of the ventricles and sulci within expected range for age. No hydrocephalus or extra-axial collection. No mass effect or midline shift. Vascular: No hyperdense vessel or unexpected calcification. Skull: No calvarial fracture or suspicious bone lesion. Skull base is unremarkable. Sinuses/Orbits: No acute finding. Other: None. IMPRESSION: 1. No acute intracranial abnormality. 2. Unchanged mild chronic small-vessel disease with old lacunar infarct in the medial aspect of the left cerebellar hemisphere. Electronically Signed   By: Orvan Falconer M.D.   On: 12/17/2022 16:49       LOS: 1 day   Edward Campos  [facility] Pager on [email or web address]  12/18/2022, 10:35 AM

## 2022-12-18 NOTE — Assessment & Plan Note (Signed)
-   Continue diltiazem - Not on anticoagulation per PTA medication list - Continue aspirin

## 2022-12-18 NOTE — Assessment & Plan Note (Signed)
-   Troponin elevated at 191, then 192, then 181 - Likely demand ischemia in the setting of hypoxia - Discussed with cardiology fellow and they agree - Continue to treat underlying cause and continue to monitor on telemetry

## 2022-12-18 NOTE — Plan of Care (Signed)
Patient  with PNA and increased shortness of breath.

## 2022-12-18 NOTE — ED Notes (Signed)
ED TO INPATIENT HANDOFF REPORT  ED Nurse Name and Phone #: Dot Lanes, paramedic [phone]  S Name/Age/Gender Edward Campos 89 y.o. male Room/Bed: 017C/017C  Code Status   Code Status: Full Code  Home/SNF/Other Skilled nursing facility Patient oriented to: self, place, time, and situation Is this baseline? Yes   Triage Complete: Triage complete  Chief Complaint Sepsis [facility]) [A41.9]  Triage Note Pt arrives via EMS from [facility]. EMS reports staff was concerned for altered mental status. Pt denies any complaints. Pt is confused but awake and alert. He denies any complaints. Vitals are stable.    Allergies Allergies  Allergen Reactions   Penicillins Other (See Comments)    Level of Care/Admitting Diagnosis ED Disposition     ED Disposition  Admit   Condition  --   Comment  Hospital Area: [facility] [[id number]]  Level of Care: Telemetry Medical [104]  May admit patient to [facility] or [facility] if equivalent level of care is available:: No  Covid Evaluation: Symptomatic Person Under Investigation (PUI) or recent exposure (last 10 days) *Testing Required*  Diagnosis: Sepsis [facility]) [[id number]]  Admitting Physician: Lilyan Gilford [[id number]]  Attending Physician: Lilyan Gilford [[id number]]  Certification:: I certify this patient will need inpatient services for at least 2 midnights  Expected Medical Readiness: 12/20/2022          B Medical/Surgery History Past Medical History:  Diagnosis Date   AAA (abdominal aortic aneurysm) (HCC)    Anxiety    Arthritis    BPH (benign prostatic hyperplasia)    Dizziness    Hyperlipidemia    PVD (peripheral vascular disease) (HCC)    S/p fem-fem bypass grafting 08/2022   SCLC (small cell lung carcinoma), left [facility])    Past Surgical History:  Procedure Laterality Date   ABDOMINAL AORTOGRAM W/LOWER EXTREMITY N/A 05/06/2019   Procedure: ABDOMINAL AORTOGRAM W/LOWER EXTREMITY;  Surgeon: Cephus Shelling, MD;  [facility] INVASIVE CV LAB;  Service: Cardiovascular;  Laterality: N/A;   ABDOMINAL WOUND DEHISCENCE N/A 08/22/2022   Procedure: WASHOUT ABDOMEN WOUND AND VAC CHANGE;  Surgeon: Cephus Shelling, MD;  Location: [facility] OR;  Service: Vascular;  Laterality: N/A;   APPLICATION OF WOUND VAC  07/06/2019   Procedure: Application Of Wound Vac Left Groin;  Surgeon: Sherren Kerns, MD;  Location: [facility] OR;  Service: Vascular;;   APPLICATION OF WOUND VAC N/A 08/16/2022   Procedure: APPLICATION OF WOUND VAC;  Surgeon: [name], MD;  Location: [facility] OR;  Service: Vascular;  Laterality: N/A;   APPLICATION OF WOUND VAC N/A 10/31/2022   Procedure: APPLICATION OF WOUND VAC;  Surgeon: Leonie Douglas, MD;  Location: [facility] OR;  Service: Vascular;  Laterality: N/A;   BRONCHIAL BIOPSY  08/21/2022   Procedure: BRONCHIAL BIOPSIES;  Surgeon: Oretha Milch, MD;  Location: [facility] ENDOSCOPY;  Service: Cardiopulmonary;;   BRONCHIAL BRUSHINGS  08/21/2022   Procedure: BRONCHIAL BRUSHINGS;  Surgeon: Oretha Milch, MD;  Location: [facility] ENDOSCOPY;  Service: Cardiopulmonary;;   BRONCHIAL WASHINGS  08/21/2022   Procedure: BRONCHIAL WASHINGS;  Surgeon: Oretha Milch, MD;  Location: [facility] ENDOSCOPY;  Service: Cardiopulmonary;;   CARDIOVERSION N/A 11/03/2020   Procedure: CARDIOVERSION;  Surgeon: Thurmon Fair, MD;  Location: [facility] ENDOSCOPY;  Service: Cardiovascular;  Laterality: N/A;   ENDARTERECTOMY FEMORAL Bilateral 06/08/2019   Procedure: ENDARTERECTOMY RIGHT COMMON FEMORAL; ENDARTERECTOMY RIGHT EXTERNAL ILIAC; ENDARTERECTOMY LEFT COMMON FEMORAL; ENDARTERECTOMY LEFT EXTERNAL ILIAC;  Surgeon: Sherren Kerns, MD;  Location: [facility] OR;  Service: Vascular;  Laterality: Bilateral;   FEMORAL-FEMORAL BYPASS GRAFT Bilateral 06/08/2019   Procedure: BYPASS GRAFT FEMORAL-FEMORAL ARTERY;  Surgeon: Sherren Kerns, MD;  Location: [facility] OR;  Service: Vascular;  Laterality: Bilateral;   FEMORAL-FEMORAL BYPASS GRAFT Bilateral  10/25/2022   Procedure: EXCISION BYPASS GRAFT FEMORAL-FEMORAL ARTERY AND REDO FEMORAL-FEMORAL BYPASS GRAFT USING CRYO-SAPHENOUS VEIN [id number];  Surgeon: Victorino Sparrow, MD;  Location: [facility] OR;  Service: Vascular;  Laterality: Bilateral;   GROIN DEBRIDEMENT Left 07/06/2019   Procedure: INCISION AND DRAINAGE OF LEFT GROIN;  Surgeon: Sherren Kerns, MD;  Location: [facility] OR;  Service: Vascular;  Laterality: Left;   growth removed from chest     HEMORRHOID SURGERY     HEMOSTASIS CONTROL  08/21/2022   Procedure: HEMOSTASIS CONTROL;  Surgeon: Oretha Milch, MD;  Location: [facility] ENDOSCOPY;  Service: Cardiopulmonary;;   HERNIA REPAIR  12 yrs ago   left inguinal hernia   I & D EXTREMITY Bilateral 10/31/2022   Procedure: BILATERAL GROIN WASHOUT AND MUSCLE FLAPS;  Surgeon: Leonie Douglas, MD;  Location[facility] OR;  Service: Vascular;  Laterality: Bilateral;   INCISE AND DRAIN ABCESS Left 07/06/2019   Incision and drainage left groin placement of VAC dressing   INCISION AND DRAINAGE OF WOUND Right 08/16/2022   Procedure: IRRIGATION AND DEBRIDEMENT WOUND;  Surgeon: [name], MD;  Location: [facility] OR;  Service: Vascular;  Laterality: Right;   INGUINAL HERNIA REPAIR  10/16/2010   Procedure: HERNIA REPAIR INGUINAL ADULT;  Surgeon: Dalia Heading;  Location: [facility] ORS;  Service: General;  Laterality: Right;   INSERTION OF ILIAC STENT Right 06/08/2019   Procedure: INSERTION OF RIGHT COMMON ILIAC AND EXTERNAL ILICAC STENT;  Surgeon: Sherren Kerns, MD;  Location: [facility] OR;  Service: Vascular;  Laterality: Right;   PATCH ANGIOPLASTY Right 06/08/2019   Procedure: Patch Angioplasty Right Common Femoral;  Surgeon: Sherren Kerns, MD;  Location: [facility] OR;  Service: Vascular;  Laterality: Right;   TEE WITHOUT CARDIOVERSION N/A 11/07/2022   Procedure: TRANSESOPHAGEAL ECHOCARDIOGRAM;  Surgeon: Jodelle Red, MD;  Location: [facility] INVASIVE CV LAB;  Service: Cardiovascular;  Laterality: N/A;   VIDEO BRONCHOSCOPY N/A  08/21/2022   Procedure: VIDEO BRONCHOSCOPY WITH FLUORO;  Surgeon: Oretha Milch, MD;  Location: [facility] ENDOSCOPY;  Service: Cardiopulmonary;  Laterality: N/A;     A IV Location/Drains/Wounds Patient Lines/Drains/Airways Status     Active Line/Drains/Airways     Name Placement date Placement time Site Days   Peripheral IV 12/17/22 18 G Anterior;Distal;Left;Upper Arm 12/17/22  1402  Arm  1   Peripheral IV 12/17/22 20 G Right Antecubital 12/17/22  1403  Antecubital  1   Negative Pressure Wound Therapy Groin Right;Left 10/31/22  1807  --  48   Wound / Incision (Open or Dehisced) 11/03/22 Incision - Open Hip Anterior;Right 11/03/22  1738  Hip  45            Intake/Output Last 24 hours  Intake/Output Summary (Last 24 hours) at 12/18/2022 1610 Last data filed at 12/18/2022 0014 Gross per 24 hour  Intake 1632.36 ml  Output --  Net 1632.36 ml    Labs/Imaging Results for orders placed or performed during the hospital encounter of 12/17/22 (from the past 48 hour(s))  CBG monitoring, ED     Status: None   Collection Time: 12/17/22  1:59 PM  Result Value Ref Range   Glucose-Capillary 98 70 - 99 mg/dL    Comment: Glucose reference range applies only to samples taken  after fasting for at least 8 hours.  Comprehensive metabolic panel     Status: Abnormal   Collection Time: 12/17/22  2:28 PM  Result Value Ref Range   Sodium 143 135 - 145 mmol/L   Potassium 3.0 (L) 3.5 - 5.1 mmol/L   Chloride 107 98 - 111 mmol/L   CO2 24 22 - 32 mmol/L   Glucose, Bld 105 (H) 70 - 99 mg/dL    Comment: Glucose reference range applies only to samples taken after fasting for at least 8 hours.   BUN 28 (H) 8 - 23 mg/dL   Creatinine, Ser 2.95 0.61 - 1.24 mg/dL   Calcium 9.5 8.9 - 62.1 mg/dL   Total Protein 6.0 (L) 6.5 - 8.1 g/dL   Albumin 2.8 (L) 3.5 - 5.0 g/dL   AST 23 15 - 41 U/L   ALT 17 0 - 44 U/L   Alkaline Phosphatase 93 38 - 126 U/L   Total Bilirubin 0.9 0.3 - 1.2 mg/dL   GFR, Estimated >30 >86  mL/min    Comment: (NOTE) Calculated using the CKD-EPI Creatinine Equation (2021)    Anion gap 12 5 - 15    Comment: Performed at [facility] Lab, [address] [place], [place] [address]  CBC     Status: Abnormal   Collection Time: 12/17/22  2:28 PM  Result Value Ref Range   WBC 13.4 (H) 4.0 - 10.5 K/uL   RBC 4.02 (L) 4.22 - 5.81 MIL/uL   Hemoglobin 11.2 (L) 13.0 - 17.0 g/dL   HCT 96.2 (L) 95.2 - 84.1 %   MCV 86.6 80.0 - 100.0 fL   MCH 27.9 26.0 - 34.0 pg   MCHC 32.2 30.0 - 36.0 g/dL   RDW 32.4 (H) 40.1 - 02.7 %   Platelets 305 150 - 400 K/uL   nRBC 0.0 0.0 - 0.2 %    Comment: Performed at [facility], [address] [place], [place] [address]  Troponin I (High Sensitivity)     Status: Abnormal   Collection Time: 12/17/22  4:27 PM  Result Value Ref Range   Troponin I (High Sensitivity) 191 (HH) <18 ng/L    Comment: CRITICAL RESULT CALLED TO, READ BACK BY AND VERIFIED WITH Randall Hiss, RN AT 1808 10.07.24 D. BLU (NOTE) Elevated high sensitivity troponin I (hsTnI) values and significant  changes across serial measurements may suggest ACS but many other  chronic and acute conditions are known to elevate hsTnI results.  Refer to the "Links" section for chest pain algorithms and additional  guidance. Performed at [facility], [address], [place], [place] [address]   Brain natriuretic peptide     Status: Abnormal   Collection Time: 12/17/22  4:27 PM  Result Value Ref Range   B Natriuretic Peptide 421.2 (H) 0.0 - 100.0 pg/mL    Comment: Performed at [facility], [address] [place], [place] [address]  Troponin I (High Sensitivity)     Status: Abnormal   Collection Time: 12/17/22  6:45 PM  Result Value Ref Range   Troponin I (High Sensitivity) 192 (HH) <18 ng/L    Comment: CRITICAL VALUE NOTED. VALUE IS CONSISTENT WITH PREVIOUSLY REPORTED/CALLED VALUE (NOTE) Elevated high sensitivity troponin I (hsTnI) values and significant  changes across serial  measurements may suggest ACS but many other  chronic and acute conditions are known to elevate hsTnI results.  Refer to the "Links" section for chest pain algorithms and additional  guidance. Performed at [facility], [address] [place], [place] [address]   Troponin I (High Sensitivity)     Status: Abnormal   Collection Time: 12/17/22  9:02 PM  Result Value Ref Range   Troponin I (High Sensitivity) 181 (HH) <18 ng/L    Comment: CRITICAL VALUE NOTED. VALUE IS CONSISTENT WITH PREVIOUSLY REPORTED/CALLED VALUE (NOTE) Elevated high sensitivity troponin I (hsTnI) values and significant  changes across serial measurements may suggest ACS but many other  chronic and acute conditions are known to elevate hsTnI results.  Refer to the "Links" section for chest pain algorithms and additional  guidance. Performed at [facility], [address] [place], [place] [address]   Procalcitonin     Status: None   Collection Time: 12/17/22  9:02 PM  Result Value Ref Range   Procalcitonin 0.80 ng/mL    Comment:        Interpretation: PCT > 0.5 ng/mL and <= 2 ng/mL: Systemic infection (sepsis) is possible, but other conditions are known to elevate PCT as well. (NOTE)       Sepsis PCT Algorithm           Lower Respiratory Tract                                      Infection PCT Algorithm    ----------------------------     ----------------------------         PCT < 0.25 ng/mL                PCT < 0.10 ng/mL          Strongly encourage             Strongly discourage   discontinuation of antibiotics    initiation of antibiotics    ----------------------------     -----------------------------       PCT 0.25 - 0.50 ng/mL            PCT 0.10 - 0.25 ng/mL               OR       >80% decrease in PCT            Discourage initiation of                                            antibiotics      Encourage discontinuation           of antibiotics    ----------------------------      -----------------------------         PCT >= 0.50 ng/mL              PCT 0.26 - 0.50 ng/mL                AND       <80% decrease in PCT             Encourage initiation of                                             antibiotics       Encourage continuation  of antibiotics    ----------------------------     -----------------------------        PCT >= 0.50 ng/mL                  PCT > 0.50 ng/mL               AND         increase in PCT                  Strongly encourage                                      initiation of antibiotics    Strongly encourage escalation           of antibiotics                                     -----------------------------                                           PCT <= 0.25 ng/mL                                                 OR                                        > 80% decrease in PCT                                      Discontinue / Do not initiate                                             antibiotics  Performed at [facility], [address] [place], [place] [address]   Lactic acid, plasma     Status: None   Collection Time: 12/17/22  9:02 PM  Result Value Ref Range   Lactic Acid, Venous 1.9 0.5 - 1.9 mmol/L    Comment: Performed at [facility], [address] [place], [place] [address]  SARS Coronavirus 2 by RT PCR (hospital order, performed in [facility] hospital lab) *cepheid single result test* Anterior Nasal Swab     Status: None   Collection Time: 12/17/22  9:02 PM   Specimen: Anterior Nasal Swab  Result Value Ref Range   SARS Coronavirus 2 by RT PCR NEGATIVE NEGATIVE    Comment: Performed at [facility] Lab, [address], [place], [place] [address]  Blood gas, venous     Status: Abnormal   Collection Time: 12/17/22  9:35 PM  Result Value Ref Range   pH, Ven 7.43 7.25 - 7.43   pCO2, Ven 37 (L) 44 - 60 mmHg   pO2, Ven 42 32 - 45 mmHg  Bicarbonate 24.6 20.0 - 28.0 mmol/L   Acid-Base Excess 0.5  0.0 - 2.0 mmol/L   O2 Saturation 63.3 %   Patient temperature 37.0     Comment: Performed at [facility], [address] [place], [place] [address]  Lactic acid, plasma     Status: None   Collection Time: 12/18/22  3:18 AM  Result Value Ref Range   Lactic Acid, Venous 1.8 0.5 - 1.9 mmol/L    Comment: Performed at [facility], [address], [place], [place] [address]  Comprehensive metabolic panel     Status: Abnormal   Collection Time: 12/18/22  3:18 AM  Result Value Ref Range   Sodium 143 135 - 145 mmol/L   Potassium 3.4 (L) 3.5 - 5.1 mmol/L   Chloride 107 98 - 111 mmol/L   CO2 21 (L) 22 - 32 mmol/L   Glucose, Bld 92 70 - 99 mg/dL    Comment: Glucose reference range applies only to samples taken after fasting for at least 8 hours.   BUN 26 (H) 8 - 23 mg/dL   Creatinine, Ser 1.91 0.61 - 1.24 mg/dL   Calcium 8.9 8.9 - 47.8 mg/dL   Total Protein 5.4 (L) 6.5 - 8.1 g/dL   Albumin 2.4 (L) 3.5 - 5.0 g/dL   AST 22 15 - 41 U/L   ALT 16 0 - 44 U/L   Alkaline Phosphatase 71 38 - 126 U/L   Total Bilirubin 0.9 0.3 - 1.2 mg/dL   GFR, Estimated >29 >56 mL/min    Comment: (NOTE) Calculated using the CKD-EPI Creatinine Equation (2021)    Anion gap 15 5 - 15    Comment: Performed at [facility] Lab, [address] [place], [place] [address]  Magnesium     Status: None   Collection Time: 12/18/22  3:18 AM  Result Value Ref Range   Magnesium 1.7 1.7 - 2.4 mg/dL    Comment: Performed at [facility], [address], [place], [place] [address]  CBC with Differential/Platelet     Status: Abnormal   Collection Time: 12/18/22  3:18 AM  Result Value Ref Range   WBC 10.4 4.0 - 10.5 K/uL   RBC 3.60 (L) 4.22 - 5.81 MIL/uL   Hemoglobin 10.1 (L) 13.0 - 17.0 g/dL   HCT 69.6 (L) 29.5 - 28.4 %   MCV 87.5 80.0 - 100.0 fL   MCH 28.1 26.0 - 34.0 pg   MCHC 32.1 30.0 - 36.0 g/dL   RDW 13.2 (H) 44.0 - 10.2 %   Platelets 243 150 - 400 K/uL   nRBC 0.0 0.0 - 0.2 %   Neutrophils  Relative % 87 %   Neutro Abs 9.0 (H) 1.7 - 7.7 K/uL   Lymphocytes Relative 5 %   Lymphs Abs 0.5 (L) 0.7 - 4.0 K/uL   Monocytes Relative 7 %   Monocytes Absolute 0.8 0.1 - 1.0 K/uL   Eosinophils Relative 0 %   Eosinophils Absolute 0.0 0.0 - 0.5 K/uL   Basophils Relative 0 %   Basophils Absolute 0.0 0.0 - 0.1 K/uL   Immature Granulocytes 1 %   Abs Immature Granulocytes 0.08 (H) 0.00 - 0.07 K/uL    Comment: Performed at [facility], [address] [place], [place] [address]   DG Chest Portable 1 View  Result Date: 12/17/2022 CLINICAL DATA:  Shortness of breath, altered mental status, history of lung cancer EXAM: PORTABLE CHEST 1 VIEW COMPARISON:  09/26/2022 FINDINGS: Cardiomegaly. Diffuse bilateral interstitial pulmonary opacity. Infrahilar mass of the left lung and probable small, layering left pleural effusion. Osseous structures unremarkable. IMPRESSION: 1. Cardiomegaly with diffuse bilateral interstitial pulmonary opacity, consistent with edema or atypical/viral infection. 2. Infrahilar mass of the left lung and probable small, layering left pleural effusion. Electronically Signed   By: Jearld Lesch M.D.   On: 12/17/2022 17:28   CT Head Wo Contrast  Result Date: 12/17/2022 CLINICAL DATA:  Mental status change, unknown cause. EXAM: CT HEAD WITHOUT CONTRAST TECHNIQUE: Contiguous axial images were obtained from the base of the skull through the vertex without intravenous contrast. RADIATION DOSE REDUCTION: This exam was performed according to the departmental dose-optimization program which includes automated exposure control, adjustment of the mA and/or kV according to patient size and/or use of iterative reconstruction technique. COMPARISON:  MRI brain 09/07/2022. FINDINGS: Brain: No acute hemorrhage. Unchanged mild chronic small-vessel disease with old lacunar infarct in the medial aspect of the left cerebellar hemisphere. Cortical gray-white differentiation is otherwise preserved.  Prominence of the ventricles and sulci within expected range for age. No hydrocephalus or extra-axial collection. No mass effect or midline shift. Vascular: No hyperdense vessel or unexpected calcification. Skull: No calvarial fracture or suspicious bone lesion. Skull base is unremarkable. Sinuses/Orbits: No acute finding. Other: None. IMPRESSION: 1. No acute intracranial abnormality. 2. Unchanged mild chronic small-vessel disease with old lacunar infarct in the medial aspect of the left cerebellar hemisphere. Electronically Signed   By: Orvan Falconer M.D.   On: 12/17/2022 16:49    Pending Labs Unresulted Labs (From admission, onward)     Start     Ordered   12/17/22 2245  Expectorated Sputum Assessment w Gram Stain, Rflx to Resp Cult  (COPD / Pneumonia / Cellulitis / Lower Extremity Wound)  Once,   R        12/17/22 2244   12/17/22 2245  Legionella Pneumophila Serogp 1 Ur Ag  (COPD / Pneumonia / Cellulitis / Lower Extremity Wound)  Once,   R        12/17/22 2244   12/17/22 2245  Strep pneumoniae urinary antigen  (COPD / Pneumonia / Cellulitis / Lower Extremity Wound)  Once,   R        12/17/22 2244   12/17/22 2245  Culture, blood (routine x 2) Call MD if unable to obtain prior to antibiotics being given  (COPD / Pneumonia / Cellulitis / Lower Extremity Wound)  BLOOD CULTURE X 2,   R (with TIMED occurrences)     Comments: If blood cultures drawn in Emergency Department - Do not draw and cancel order    12/17/22 2244   12/17/22 2245  Urinalysis, Complete w Microscopic -Urine, Clean Catch  Once,   R       Question:  Specimen Source  Answer:  Urine, Clean Catch   12/17/22 2244   12/17/22 1547  Urinalysis, w/ Reflex to Culture (Infection Suspected) -Urine, Clean Catch  Once,   URGENT       Question:  Specimen Source  Answer:  Urine, Clean Catch   12/17/22 1547            Vitals/Pain Today's Vitals   12/18/22 0500 12/18/22 0606 12/18/22 0645 12/18/22 0803  BP:   (!) 148/89 (!) 155/106   Pulse: 94  99 87  Resp: (!) 21  20 18   Temp:  97.9 F (36.6 C)  98 F (36.7 C)  TempSrc:  Oral  Oral  SpO2: 97%  96% 96%  Weight:      Height:      PainSc:    2     Isolation Precautions Airborne and Contact precautions  Medications Medications  aspirin EC tablet 81 mg (has no administration in time range)  diltiazem (CARDIZEM CD) 24 hr capsule 180 mg (has no administration in time range)  metoprolol tartrate (LOPRESSOR) tablet 25 mg (25 mg Oral Given 12/17/22 2311)  rosuvastatin (CRESTOR) tablet 20 mg (has no administration in time range)  pantoprazole (PROTONIX) EC tablet 40 mg (has no administration in time range)  clopidogrel (PLAVIX) tablet 75 mg (has no administration in time range)  cefTRIAXone (ROCEPHIN) 2 g in sodium chloride 0.9 % 100 mL IVPB (has no administration in time range)  azithromycin (ZITHROMAX) 500 mg in sodium chloride 0.9 % 250 mL IVPB (has no administration in time range)  heparin injection 5,000 Units (5,000 Units Subcutaneous Given 12/18/22 0516)  acetaminophen (TYLENOL) tablet 650 mg (has no administration in time range)    Or  acetaminophen (TYLENOL) suppository 650 mg (has no administration in time range)  oxyCODONE (Oxy IR/ROXICODONE) immediate release tablet 5 mg (has no administration in time range)  ondansetron (ZOFRAN) tablet 4 mg (has no administration in time range)    Or  ondansetron (ZOFRAN) injection 4 mg (has no administration in time range)  albuterol (PROVENTIL) (2.5 MG/3ML) 0.083% nebulizer solution 2.5 mg (has no administration in time range)  cefTRIAXone (ROCEPHIN) 2 g in sodium chloride 0.9 % 100 mL IVPB (0 g Intravenous Stopped 12/17/22 1915)  azithromycin (ZITHROMAX) 500 mg in sodium chloride 0.9 % 250 mL IVPB (0 mg Intravenous Stopped 12/17/22 2035)  lactated ringers bolus 1,000 mL (0 mLs Intravenous Stopped 12/17/22 1949)  potassium chloride 10 mEq in 100 mL IVPB (0 mEq Intravenous Stopped 12/18/22 0115)  potassium chloride SA  (KLOR-CON M) CR tablet 40 mEq (40 mEq Oral Given 12/18/22 1610)    Mobility walks with device     Focused Assessments    R Recommendations: See Admitting Provider Note  Report given to:   Additional Notes:

## 2022-12-18 NOTE — Evaluation (Signed)
Clinical/Bedside Swallow Evaluation Patient Details  Name: Detavious Bainbridge MRN: [id number] Date of Birth: [birth year]  Today's Date: 12/18/2022 Time: SLP Start Time (ACUTE ONLY): 0854 SLP Stop Time (ACUTE ONLY): 0905 SLP Time Calculation (min) (ACUTE ONLY): 11 min  Past Medical History:  Past Medical History:  Diagnosis Date   AAA (abdominal aortic aneurysm) (HCC)    Anxiety    Arthritis    BPH (benign prostatic hyperplasia)    Dizziness    Hyperlipidemia    PVD (peripheral vascular disease) (HCC)    S/p fem-fem bypass grafting 08/2022   SCLC (small cell lung carcinoma), left [facility])    Past Surgical History:  Past Surgical History:  Procedure Laterality Date   ABDOMINAL AORTOGRAM W/LOWER EXTREMITY N/A 05/06/2019   Procedure: ABDOMINAL AORTOGRAM W/LOWER EXTREMITY;  Surgeon: Cephus Shelling, MD;  Location: [facility] INVASIVE CV LAB;  Service: Cardiovascular;  Laterality: N/A;   ABDOMINAL WOUND DEHISCENCE N/A 08/22/2022   Procedure: WASHOUT ABDOMEN WOUND AND VAC CHANGE;  Surgeon: Cephus Shelling, MD;  Location: [facility] OR;  Service: Vascular;  Laterality: N/A;   APPLICATION OF WOUND VAC  07/06/2019   Procedure: Application Of Wound Vac Left Groin;  Surgeon: Sherren Kerns, MD;  Location: [facility] OR;  Service: Vascular;;   APPLICATION OF WOUND VAC N/A 08/16/2022   Procedure: APPLICATION OF WOUND VAC;  Surgeon: [name], MD;  Location: [facility] OR;  Service: Vascular;  Laterality: N/A;   APPLICATION OF WOUND VAC N/A 10/31/2022   Procedure: APPLICATION OF WOUND VAC;  Surgeon: Leonie Douglas, MD;  Location: [facility] OR;  Service: Vascular;  Laterality: N/A;   BRONCHIAL BIOPSY  08/21/2022   Procedure: BRONCHIAL BIOPSIES;  Surgeon: Oretha Milch, MD;  Location: [facility] ENDOSCOPY;  Service: Cardiopulmonary;;   BRONCHIAL BRUSHINGS  08/21/2022   Procedure: BRONCHIAL BRUSHINGS;  Surgeon: Oretha Milch, MD;  Location: [facility] ENDOSCOPY;  Service: Cardiopulmonary;;   BRONCHIAL WASHINGS  08/21/2022   Procedure:  BRONCHIAL WASHINGS;  Surgeon: Oretha Milch, MD;  Location: [facility] ENDOSCOPY;  Service: Cardiopulmonary;;   CARDIOVERSION N/A 11/03/2020   Procedure: CARDIOVERSION;  Surgeon: Thurmon Fair, MD;  Location: [facility] ENDOSCOPY;  Service: Cardiovascular;  Laterality: N/A;   ENDARTERECTOMY FEMORAL Bilateral 06/08/2019   Procedure: ENDARTERECTOMY RIGHT COMMON FEMORAL; ENDARTERECTOMY RIGHT EXTERNAL ILIAC; ENDARTERECTOMY LEFT COMMON FEMORAL; ENDARTERECTOMY LEFT EXTERNAL ILIAC;  Surgeon: Sherren Kerns, MD;  Location: [facility] OR;  Service: Vascular;  Laterality: Bilateral;   FEMORAL-FEMORAL BYPASS GRAFT Bilateral 06/08/2019   Procedure: BYPASS GRAFT FEMORAL-FEMORAL ARTERY;  Surgeon: Sherren Kerns, MD;  Location: [facility] OR;  Service: Vascular;  Laterality: Bilateral;   FEMORAL-FEMORAL BYPASS GRAFT Bilateral 10/25/2022   Procedure: EXCISION BYPASS GRAFT FEMORAL-FEMORAL ARTERY AND REDO FEMORAL-FEMORAL BYPASS GRAFT USING CRYO-SAPHENOUS VEIN [id number];  Surgeon: Victorino Sparrow, MD;  Location: [facility] OR;  Service: Vascular;  Laterality: Bilateral;   GROIN DEBRIDEMENT Left 07/06/2019   Procedure: INCISION AND DRAINAGE OF LEFT GROIN;  Surgeon: Sherren Kerns, MD;  Location: [facility] OR;  Service: Vascular;  Laterality: Left;   growth removed from chest     HEMORRHOID SURGERY     HEMOSTASIS CONTROL  08/21/2022   Procedure: HEMOSTASIS CONTROL;  Surgeon: Oretha Milch, MD;  Location: [facility] ENDOSCOPY;  Service: Cardiopulmonary;;   HERNIA REPAIR  12 yrs ago   left inguinal hernia   I & D EXTREMITY Bilateral 10/31/2022   Procedure: BILATERAL GROIN WASHOUT AND MUSCLE FLAPS;  Surgeon: Leonie Douglas, MD;  Location: [facility] OR;  Service: Vascular;  Laterality: Bilateral;  INCISE AND DRAIN ABCESS Left 07/06/2019   Incision and drainage left groin placement of VAC dressing   INCISION AND DRAINAGE OF WOUND Right 08/16/2022   Procedure: IRRIGATION AND DEBRIDEMENT WOUND;  Surgeon: [name], MD;  Location: [facility] OR;  Service: Vascular;   Laterality: Right;   INGUINAL HERNIA REPAIR  10/16/2010   Procedure: HERNIA REPAIR INGUINAL ADULT;  Surgeon: Dalia Heading;  Location: [facility] ORS;  Service: General;  Laterality: Right;   INSERTION OF ILIAC STENT Right 06/08/2019   Procedure: INSERTION OF RIGHT COMMON ILIAC AND EXTERNAL ILICAC STENT;  Surgeon: Sherren Kerns, MD;  Location: [facility] OR;  Service: Vascular;  Laterality: Right;   PATCH ANGIOPLASTY Right 06/08/2019   Procedure: Patch Angioplasty Right Common Femoral;  Surgeon: Sherren Kerns, MD;  Location: [facility] OR;  Service: Vascular;  Laterality: Right;   TEE WITHOUT CARDIOVERSION N/A 11/07/2022   Procedure: TRANSESOPHAGEAL ECHOCARDIOGRAM;  Surgeon: Jodelle Red, MD;  Location: [facility] INVASIVE CV LAB;  Service: Cardiovascular;  Laterality: N/A;   VIDEO BRONCHOSCOPY N/A 08/21/2022   Procedure: VIDEO BRONCHOSCOPY WITH FLUORO;  Surgeon: Oretha Milch, MD;  Location: [facility] ENDOSCOPY;  Service: Cardiopulmonary;  Laterality: N/A;   HPI:  Konstantine Vogelpohl is a 89 y.o. male who presented the ED with a chief complaint of altered mental status. CXR 10/7: "diffuse bilateral interstitial pulmonary opacity, consistent with edema or atypical/viral infection."  Head CT 10/7 with no acute findings. Pt with medical history significant of AAA, anxiety, hyperlipidemia, peripheral vascular disease, small cell lung carcinoma, atrial flutter and more.    Assessment / Plan / Recommendation  Clinical Impression  Pt presents with mild risk of aspiration in setting of recurrent pna and subjective complaints of difficulty swallowing. Pt with rattling breath sounds on SLP arrival.  Pt tolerated all consistencies trialed today; however, a concern for prandial aspiration still exists.  Pt states this is his 2nd case of pna since June of this year.  He reports difficulty swallowing at baseline and notes frequent coughing in presence and absence of POs.  Clinically pt appears safe to continue current diet, but recommend  further assessment of swallow function to rule out silent aspiration.  Pt is agreeable to MBS.    Recommend continuing regular texture diet with thin liquids.  SLP Visit Diagnosis: Dysphagia, unspecified (R13.10)    Aspiration Risk  Mild aspiration risk    Diet Recommendation Regular;Thin liquid    Liquid Administration via: Cup;Straw Medication Administration: Whole meds with liquid Supervision: Patient able to self feed Compensations: Slow rate;Small sips/bites Postural Changes: Seated upright at 90 degrees    Other  Recommendations Oral Care Recommendations: Oral care BID    Recommendations for follow up therapy are one component of a multi-disciplinary discharge planning process, led by the attending physician.  Recommendations may be updated based on patient status, additional functional criteria and insurance authorization.  Follow up Recommendations  (TBD)      Assistance Recommended at Discharge  N/A  Functional Status Assessment  (TBD)  Frequency and Duration  (TBD)          Prognosis Prognosis for improved oropharyngeal function:  (TBD)      Swallow Study   General Date of Onset: 12/17/22 HPI: Coltan Franchetti is a 89 y.o. male who presented the ED with a chief complaint of altered mental status. CXR 10/7: "diffuse bilateral interstitial pulmonary opacity, consistent with edema or atypical/viral infection."  Head CT 10/7 with no acute findings. Pt with medical history significant of  AAA, anxiety, hyperlipidemia, peripheral vascular disease, small cell lung carcinoma, atrial flutter and more. Type of Study: Bedside Swallow Evaluation Previous Swallow Assessment: None Diet Prior to this Study: Regular;Thin liquids (Level 0) Respiratory Status: Nasal cannula History of Recent Intubation: No Behavior/Cognition: Cooperative;Alert;Pleasant mood Oral Cavity Assessment: Within Functional Limits Oral Care Completed by SLP: No Oral Cavity - Dentition: Dentures,  top Vision: Functional for self-feeding Self-Feeding Abilities: Able to feed self Patient Positioning: Upright in bed Baseline Vocal Quality: Normal Volitional Cough: Strong Volitional Swallow: Able to elicit    Oral/Motor/Sensory Function Overall Oral Motor/Sensory Function: Mild impairment Facial ROM: Reduced left Facial Symmetry: Abnormal symmetry left Lingual ROM: Within Functional Limits Lingual Symmetry: Within Functional Limits Lingual Strength: Within Functional Limits Velum: Within Functional Limits Mandible: Within Functional Limits   Ice Chips Ice chips: Not tested   Thin Liquid Thin Liquid: Within functional limits Presentation: Straw    Nectar Thick Nectar Thick Liquid: Not tested   Honey Thick Honey Thick Liquid: Not tested   Puree Puree: Within functional limits Presentation: Spoon   Solid     Solid: Within functional limits Presentation: Self Fed      Kerrie Pleasure, MA, CCC-SLP Acute Rehabilitation Services Office: [phone] 12/18/2022,9:19 AM

## 2022-12-19 ENCOUNTER — Inpatient Hospital Stay (HOSPITAL_COMMUNITY): Payer: Medicare Other

## 2022-12-19 DIAGNOSIS — R6520 Severe sepsis without septic shock: Secondary | ICD-10-CM | POA: Diagnosis not present

## 2022-12-19 DIAGNOSIS — J9601 Acute respiratory failure with hypoxia: Secondary | ICD-10-CM | POA: Diagnosis not present

## 2022-12-19 DIAGNOSIS — A4102 Sepsis due to Methicillin resistant Staphylococcus aureus: Secondary | ICD-10-CM | POA: Diagnosis not present

## 2022-12-19 LAB — CBC
HCT: 31.8 % — ABNORMAL LOW (ref 39.0–52.0)
Hemoglobin: 10.1 g/dL — ABNORMAL LOW (ref 13.0–17.0)
MCH: 28.2 pg (ref 26.0–34.0)
MCHC: 31.8 g/dL (ref 30.0–36.0)
MCV: 88.8 fL (ref 80.0–100.0)
Platelets: 263 10*3/uL (ref 150–400)
RBC: 3.58 MIL/uL — ABNORMAL LOW (ref 4.22–5.81)
RDW: 17 % — ABNORMAL HIGH (ref 11.5–15.5)
WBC: 7.7 10*3/uL (ref 4.0–10.5)
nRBC: 0 % (ref 0.0–0.2)

## 2022-12-19 LAB — C-REACTIVE PROTEIN: CRP: 13.5 mg/dL — ABNORMAL HIGH (ref <1.0)

## 2022-12-19 LAB — PROCALCITONIN: Procalcitonin: 1.01 ng/mL

## 2022-12-19 LAB — BASIC METABOLIC PANEL
Anion gap: 8 (ref 5–15)
BUN: 25 mg/dL — ABNORMAL HIGH (ref 8–23)
CO2: 25 mmol/L (ref 22–32)
Calcium: 9.4 mg/dL (ref 8.9–10.3)
Chloride: 110 mmol/L (ref 98–111)
Creatinine, Ser: 0.94 mg/dL (ref 0.61–1.24)
GFR, Estimated: >60 mL/min (ref >60)
Glucose, Bld: 98 mg/dL (ref 70–99)
Potassium: 3.3 mmol/L — ABNORMAL LOW (ref 3.5–5.1)
Sodium: 143 mmol/L (ref 135–145)

## 2022-12-19 LAB — LEGIONELLA PNEUMOPHILA SEROGP 1 UR AG: L. pneumophila Serogp 1 Ur Ag: NEGATIVE

## 2022-12-19 LAB — MAGNESIUM: Magnesium: 2.1 mg/dL (ref 1.7–2.4)

## 2022-12-19 LAB — BRAIN NATRIURETIC PEPTIDE: B Natriuretic Peptide: 301.2 pg/mL — ABNORMAL HIGH (ref 0.0–100.0)

## 2022-12-19 MED ORDER — POTASSIUM CHLORIDE CRYS ER 20 MEQ PO TBCR
20.0000 meq | EXTENDED_RELEASE_TABLET | Freq: Once | ORAL | Status: AC
  Administered 2022-12-19: 20 meq via ORAL
  Filled 2022-12-19: qty 1

## 2022-12-19 MED ORDER — POTASSIUM CHLORIDE CRYS ER 20 MEQ PO TBCR
40.0000 meq | EXTENDED_RELEASE_TABLET | Freq: Once | ORAL | Status: AC
  Administered 2022-12-19: 40 meq via ORAL
  Filled 2022-12-19: qty 2

## 2022-12-19 MED ORDER — FUROSEMIDE 10 MG/ML IJ SOLN
20.0000 mg | Freq: Once | INTRAMUSCULAR | Status: AC
  Administered 2022-12-19: 20 mg via INTRAVENOUS
  Filled 2022-12-19: qty 2

## 2022-12-19 MED ORDER — AZITHROMYCIN 500 MG PO TABS
500.0000 mg | ORAL_TABLET | Freq: Every day | ORAL | Status: AC
  Administered 2022-12-19 – 2022-12-21 (×3): 500 mg via ORAL
  Filled 2022-12-19 (×3): qty 1

## 2022-12-19 NOTE — Evaluation (Signed)
Physical Therapy Evaluation Patient Details Name: Edward Campos MRN: [id number] DOB: January 20, 1933 Today's Date: 12/19/2022  History of Present Illness  Patient is a 89 year old male coming from nursing facility with acute metabolic encephalopathy, acute respiratory failure with hypoxia, community acquired pneumonia/sepsis.  Recent MRSA bacteremia and infected right femoral to femoral dacryon bypass status post excision. History of small cell lung cancer  Clinical Impression  Patient is agreeable to PT evaluation. He has been at rehab recently and reports walking using a rolling walker.  Today, the patient complains of low back pain. He was able to perform several transfers, including up to bed side commode. Assistance is required with all mobility. Standing tolerance is less than 30 seconds and patient has dyspnea with exertion. Sp02 around 90% on room air with standing activity. Anticipate patient could benefit from continued rehabiliation after this hospital stay < 3 hours/day pending patient and family goals. PT will continue to follow.       If plan is discharge home, recommend the following: A lot of help with walking and/or transfers;A lot of help with bathing/dressing/bathroom;Assist for transportation;Help with stairs or ramp for entrance;Assistance with cooking/housework   Can travel by private vehicle   No    Equipment Recommendations  (to be determined at next level of care)  Recommendations for Other Services       Functional Status Assessment Patient has had a recent decline in their functional status and demonstrates the ability to make significant improvements in function in a reasonable and predictable amount of time.     Precautions / Restrictions Precautions Precautions: Fall Precaution Comments: aspiration risk Restrictions Weight Bearing Restrictions: No      Mobility  Bed Mobility Overal bed mobility: Needs Assistance Bed Mobility: Supine to Sit, Sit to  Supine     Supine to sit: Mod assist Sit to supine: Mod assist   General bed mobility comments: assistance for trunk and BLE support.    Transfers Overall transfer level: Needs assistance Equipment used: 1 person hand held assist Transfers: Sit to/from Stand, Bed to chair/wheelchair/BSC Sit to Stand: Mod assist   Step pivot transfers: Mod assist, Max assist       General transfer comment: several transfers performed with increased assistance required with fatigue. cues for hand placement with standing    Ambulation/Gait               General Gait Details: not attempted due to fatigue with getting up to bed side commode  Stairs            Wheelchair Mobility     Tilt Bed    Modified Rankin (Stroke Patients Only)       Balance Overall balance assessment: Needs assistance Sitting-balance support: Feet supported Sitting balance-Leahy Scale: Fair     Standing balance support: Single extremity supported Standing balance-Leahy Scale: Poor Standing balance comment: relying on external support. nurse in the room provided pericare while therapist assisted with standing balance. standing tolerance is around 30 seconds before needing to sit and rest                             Pertinent Vitals/Pain Pain Assessment Pain Assessment: Faces Faces Pain Scale: Hurts little more Pain Location: lower back Pain Descriptors / Indicators: Discomfort Pain Intervention(s): Repositioned    Home Living Family/patient expects to be discharged to:: Skilled nursing facility  Additional Comments: recently at SNF for rehab following surgery    Prior Function Prior Level of Function : Needs assist             Mobility Comments: short distance ambulation with rolling walker (although patient reports he was not progressing has he had hoped) ADLs Comments: assistance required at [facility]     Extremity/Trunk Assessment   Upper Extremity  Assessment Upper Extremity Assessment: Generalized weakness    Lower Extremity Assessment Lower Extremity Assessment: Generalized weakness       Communication   Communication Communication: Hearing impairment  Cognition Arousal: Alert Behavior During Therapy: Restless (mild restless) Overall Cognitive Status: Within Functional Limits for tasks assessed                                 General Comments: increased time for processing        General Comments General comments (skin integrity, edema, etc.): Sp02 around 90% on room air with activity and patient has dyspnea with exertion    Exercises     Assessment/Plan    PT Assessment Patient needs continued PT services  PT Problem List Decreased strength;Decreased activity tolerance;Decreased balance;Decreased mobility;Decreased knowledge of precautions       PT Treatment Interventions DME instruction;Gait training;Functional mobility training;Therapeutic activities;Therapeutic exercise;Balance training;Stair training;Neuromuscular re-education;Cognitive remediation;Patient/family education    PT Goals (Current goals can be found in the Care Plan section)  Acute Rehab PT Goals Patient Stated Goal: to feel better PT Goal Formulation: With patient Time For Goal Achievement: 12/26/22 Potential to Achieve Goals: Fair    Frequency Min 1X/week     Co-evaluation               AM-PAC PT "6 Clicks" Mobility  Outcome Measure Help needed turning from your back to your side while in a flat bed without using bedrails?: A Lot Help needed moving from lying on your back to sitting on the side of a flat bed without using bedrails?: A Lot Help needed moving to and from a bed to a chair (including a wheelchair)?: A Lot Help needed standing up from a chair using your arms (e.g., wheelchair or bedside chair)?: A Lot Help needed to walk in hospital room?: A Lot Help needed climbing 3-5 steps with a railing? : Total 6  Click Score: 11    End of Session   Activity Tolerance: Patient limited by fatigue Patient left: in bed;with call bell/phone within reach;with bed alarm set;with family/visitor present (daughter present) Nurse Communication: Mobility status PT Visit Diagnosis: Unsteadiness on feet (R26.81);Muscle weakness (generalized) (M62.81)    Time: 8119-1478 PT Time Calculation (min) (ACUTE ONLY): 29 min   Charges:   PT Evaluation $PT Eval Moderate Complexity: 1 Mod   PT General Charges $$ ACUTE PT VISIT: 1 Visit         Donna Bernard, PT, MPT   Ina Homes 12/19/2022, 2:42 PM

## 2022-12-19 NOTE — Progress Notes (Signed)
PHARMACIST - PHYSICIAN COMMUNICATION °DR:   Singh °CONCERNING: Antibiotic IV to Oral Route Change Policy ° °RECOMMENDATION: °This patient is receiving azithromycin by the intravenous route.  Based on criteria approved by the Pharmacy and Therapeutics Committee, the antibiotic(s) is/are being converted to the equivalent oral dose form(s). ° ° °DESCRIPTION: °These criteria include: °Patient being treated for a respiratory tract infection, urinary tract infection, cellulitis or clostridium difficile associated diarrhea if on metronidazole °The patient is not neutropenic and does not exhibit a GI malabsorption state °The patient is eating (either orally or via tube) and/or has been taking other orally administered medications for a least 24 hours °The patient is improving clinically and has a Tmax < 100.5 ° °If you have questions about this conversion, please contact the Pharmacy Department  °[]  ( [phone] )  [place] °[]  ( [phone] )  [facility] °[x]  ( [phone] )  [place] °[]  ( [phone] )  [facility] °[]  ( [phone] )  [facility]  ° °

## 2022-12-19 NOTE — Progress Notes (Addendum)
PROGRESS NOTE                                                                                                                                                                                                             Patient Demographics:    Edward Campos, is a 89 y.o. male, DOB - 03-03-1933, YHC:[id number]  Outpatient Primary MD for the patient is Patient, No Pcp Per    LOS - 2  Admit date - 12/17/2022    Chief Complaint  Patient presents with   Altered Mental Status       Brief Narrative (HPI from H&P)   89 y.o. male with medical history significant of AAA, anxiety, hyperlipidemia, peripheral vascular disease, small cell lung carcinoma, atrial flutter and more presented to the ED with a chief complaint of altered mental status.  He is from a nursing facility and staff there reported onset of shortness of breath over the last few days.    Subjective:    Edward Campos today has, No headache, No chest pain, No abdominal pain - No Nausea, No new weakness tingling or numbness, +ve cough & SOB   Assessment  & Plan :   Acute metabolic encephalopathy due to sepsis from community-acquired pneumonia/aspiration pneumonia with acute hypoxic respiratory failure in the presence of left small cell cancer.  Apparently at baseline patient has normal mentation.  Head CT unremarkable, no focal deficits, continue supportive care, minimize narcotics and benzodiazepines.    Sepsis from community-acquired pneumonia/aspiration pneumonia with acute hypoxic respiratory failure in the presence of left small cell cancer.     Pneumonia could be due to microaspiration versus postobstructive pneumonia from underlying small cell cancer, mildly elevated CRP and procalcitonin, with empiric antibiotics improving, speech therapy following, encouraged to sit in chair use I-S and flutter valve for pulmonary toiletry.  Follow cultures, post discharge  follow-up with oncology as soon as possible to commence treatment for underlying small cell cancer.  History of small cell lung cancer  Followed by oncology in [place].  Follow-up postdischarge.  Elevated troponin  Recent echocardiogram showed normal systolic function.  Chest pain-free, troponin trend is flat and in non-ACS pattern, EKG nonacute, not a candidate for invasive testing or procedure at this point.  Continue DAPT, statin for secondary prevention with outpatient follow-up with PCP and primary cardiologist if needed.  Mild acute on chronic diastolic CHF EF 65%.  Gentle Lasix x 1 on 12/19/2022 and monitor.    Recent MRSA bacteremia and infected right femoral to femoral dacryon bypass status post excision. Looks like he recently completed 6 weeks of treatment with daptomycin.  He was being followed by infectious disease.  Also being followed by vascular surgery.   Peripheral artery disease Continue aspirin Plavix and statin.   Hypokalemia replaced.   Normocytic anemia  Mild drop in hemoglobin is likely dilutional.  No evidence for bleeding.   History of atrial flutter  Oral diltiazem.  Not on anticoagulation.  Noted to be on aspirin and Plavix.        Condition - Extremely Guarded  Family Communication  :  daughter [phone] 12/19/22  Code Status :  Full  Consults  :  None  PUD Prophylaxis :  PPI   Procedures  :     CT Head - 1. No acute intracranial abnormality. 2. Unchanged mild chronic small-vessel disease with old lacunar infarct in the medial aspect of the left cerebellar hemisphere.       Disposition Plan  :    Status is: Inpatient  DVT Prophylaxis  :    heparin injection 5,000 Units Start: 12/17/22 2245 SCDs Start: 12/17/22 2245    Lab Results  Component Value Date   PLT 263 12/19/2022    Diet :  Diet Order             Diet Heart Room service appropriate? Yes; Fluid consistency: Thin  Diet effective now                    Inpatient  Medications  Scheduled Meds:  aspirin EC  81 mg Oral Daily   clopidogrel  75 mg Oral Daily   diltiazem  180 mg Oral Daily   furosemide  20 mg Intravenous Once   heparin  5,000 Units Subcutaneous Q8H   metoprolol tartrate  25 mg Oral BID   pantoprazole  40 mg Oral Daily   potassium chloride  20 mEq Oral Once   rosuvastatin  20 mg Oral Daily   Continuous Infusions:  azithromycin Stopped (12/18/22 2127)   cefTRIAXone (ROCEPHIN)  IV Stopped (12/18/22 2057)   PRN Meds:.acetaminophen **OR** acetaminophen, albuterol, hydrALAZINE, ondansetron **OR** ondansetron (ZOFRAN) IV, oxyCODONE    Objective:   Vitals:   12/18/22 1700 12/18/22 2025 12/18/22 2310 12/19/22 0400  BP:  (!) 140/62 (!) 135/90 (!) 149/73  Pulse:  74 77 74  Resp:  20 16 18   Temp:  98.2 F (36.8 C) 98 F (36.7 C) 98.1 F (36.7 C)  TempSrc:  Oral Oral Oral  SpO2: 92% 97% 95% 96%  Weight:      Height:        Wt Readings from Last 3 Encounters:  12/17/22 72.6 kg  12/04/22 63 kg  11/30/22 63.2 kg     Intake/Output Summary (Last 24 hours) at 12/19/2022 1011 Last data filed at 12/19/2022 0529 Gross per 24 hour  Intake 839.94 ml  Output --  Net 839.94 ml     Physical Exam  Awake Alert, No new F.N deficits, Normal affect [place].AT,PERRAL Supple Neck, No JVD,   Symmetrical Chest wall movement, Good air movement bilaterally, few coarse B sounds RRR,No Gallops,Rubs or new Murmurs,  +ve B.Sounds, Abd Soft, No tenderness,   No Cyanosis, Clubbing or edema      Data Review:    Recent Labs  Lab 12/17/22 1428 12/18/22  6644 12/19/22 0335  WBC 13.4* 10.4 7.7  HGB 11.2* 10.1* 10.1*  HCT 34.8* 31.5* 31.8*  PLT 305 243 263  MCV 86.6 87.5 88.8  MCH 27.9 28.1 28.2  MCHC 32.2 32.1 31.8  RDW 16.9* 17.2* 17.0*  LYMPHSABS  --  0.5*  --   MONOABS  --  0.8  --   EOSABS  --  0.0  --   BASOSABS  --  0.0  --     Recent Labs  Lab 12/17/22 1428 12/17/22 1627 12/17/22 2102 12/18/22 0318 12/19/22 0335  12/19/22 0738  NA 143  --   --  143 143  --   K 3.0*  --   --  3.4* 3.3*  --   CL 107  --   --  107 110  --   CO2 24  --   --  21* 25  --   ANIONGAP 12  --   --  15 8  --   GLUCOSE 105*  --   --  92 98  --   BUN 28*  --   --  26* 25*  --   CREATININE 1.00  --   --  0.96 0.94  --   AST 23  --   --  22  --   --   ALT 17  --   --  16  --   --   ALKPHOS 93  --   --  71  --   --   BILITOT 0.9  --   --  0.9  --   --   ALBUMIN 2.8*  --   --  2.4*  --   --   CRP  --   --   --   --   --  13.5*  PROCALCITON  --   --  0.80  --  1.01  --   LATICACIDVEN  --   --  1.9 1.8  --   --   BNP  --  421.2*  --   --  301.2*  --   MG  --   --   --  1.7 2.1  --   CALCIUM 9.5  --   --  8.9 9.4  --       Recent Labs  Lab 12/17/22 1428 12/17/22 1627 12/17/22 2102 12/18/22 0318 12/19/22 0335 12/19/22 0738  CRP  --   --   --   --   --  13.5*  PROCALCITON  --   --  0.80  --  1.01  --   LATICACIDVEN  --   --  1.9 1.8  --   --   BNP  --  421.2*  --   --  301.2*  --   MG  --   --   --  1.7 2.1  --   CALCIUM 9.5  --   --  8.9 9.4  --     --------------------------------------------------------------------------------------------------------------- Lab Results  Component Value Date   CHOL 58 10/26/2022   HDL 15 (L) 10/26/2022   LDLCALC 29 10/26/2022   TRIG 68 10/26/2022   CHOLHDL 3.9 10/26/2022    Lab Results  Component Value Date   HGBA1C 5.0 05/29/2019    Radiology Reports DG Chest Port 1 View  Result Date: 12/19/2022 CLINICAL DATA:  Shortness of breath, cough, fever and sepsis. EXAM: PORTABLE CHEST 1 VIEW COMPARISON:  12/17/22 FINDINGS: Stable cardiomediastinal contours. Aortic atherosclerotic calcifications. Small left pleural effusion with veil like  opacification over the left base is unchanged. Mild to moderate interstitial edema. Unchanged appearance of left lower lobe perihilar lung mass. The visualized osseous structures are unremarkable. IMPRESSION: 1. Mild to moderate interstitial  edema. 2. Small left pleural effusion. 3. Unchanged appearance of left lower lobe perihilar lung mass. Electronically Signed   By: Signa Kell M.D.   On: 12/19/2022 06:26   DG Chest Portable 1 View  Result Date: 12/17/2022 CLINICAL DATA:  Shortness of breath, altered mental status, history of lung cancer EXAM: PORTABLE CHEST 1 VIEW COMPARISON:  09/26/2022 FINDINGS: Cardiomegaly. Diffuse bilateral interstitial pulmonary opacity. Infrahilar mass of the left lung and probable small, layering left pleural effusion. Osseous structures unremarkable. IMPRESSION: 1. Cardiomegaly with diffuse bilateral interstitial pulmonary opacity, consistent with edema or atypical/viral infection. 2. Infrahilar mass of the left lung and probable small, layering left pleural effusion. Electronically Signed   By: Jearld Lesch M.D.   On: 12/17/2022 17:28   CT Head Wo Contrast  Result Date: 12/17/2022 CLINICAL DATA:  Mental status change, unknown cause. EXAM: CT HEAD WITHOUT CONTRAST TECHNIQUE: Contiguous axial images were obtained from the base of the skull through the vertex without intravenous contrast. RADIATION DOSE REDUCTION: This exam was performed according to the departmental dose-optimization program which includes automated exposure control, adjustment of the mA and/or kV according to patient size and/or use of iterative reconstruction technique. COMPARISON:  MRI brain 09/07/2022. FINDINGS: Brain: No acute hemorrhage. Unchanged mild chronic small-vessel disease with old lacunar infarct in the medial aspect of the left cerebellar hemisphere. Cortical gray-white differentiation is otherwise preserved. Prominence of the ventricles and sulci within expected range for age. No hydrocephalus or extra-axial collection. No mass effect or midline shift. Vascular: No hyperdense vessel or unexpected calcification. Skull: No calvarial fracture or suspicious bone lesion. Skull base is unremarkable. Sinuses/Orbits: No acute finding.  Other: None. IMPRESSION: 1. No acute intracranial abnormality. 2. Unchanged mild chronic small-vessel disease with old lacunar infarct in the medial aspect of the left cerebellar hemisphere. Electronically Signed   By: Orvan Falconer M.D.   On: 12/17/2022 16:49      Signature  -   Susa Raring M.D on 12/19/2022 at 10:11 AM   -  To page go to [email or web address]

## 2022-12-19 NOTE — NC FL2 (Signed)
MEDICAID FL2 LEVEL OF CARE FORM     IDENTIFICATION  Patient Name: Edward Campos Birthdate: Aug 17, 1932 Sex: male Admission Date (Current Location): 12/17/2022  [facility] and [place] Number:  [employer] and Address:  [facility] [place], [address], [place], [place] [address]      Provider Number: [id number]  Attending Physician Name and Address:  Leroy Sea, MD  Relative Name and Phone Number:  [name]; [phone]    Current Level of Care: Hospital Recommended Level of Care: Skilled Nursing Facility ([facility]) Prior Approval Number:    Date Approved/Denied:   PASRR Number: [id number]  Discharge Plan: SNF ([facility])    Current Diagnoses: Patient Active Problem List   Diagnosis Date Noted   Elevated troponin 12/18/2022   Acute respiratory failure with hypoxia (HCC) 12/18/2022   Acute metabolic encephalopathy 12/18/2022   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   MRSA bacteremia 10/27/2022   Sepsis due to methicillin resistant Staphylococcus aureus (MRSA) (HCC) 10/27/2022   Sepsis (HCC) 10/24/2022   Hypokalemia 10/24/2022   Transaminitis 10/24/2022   Hyponatremia 10/24/2022   AAA (abdominal aortic aneurysm) (HCC) 10/24/2022   PAD (peripheral artery disease) (HCC) 10/24/2022   Infection 10/22/2022   Shock (HCC) 09/25/2022   Pneumonia 09/06/2022   Small cell lung cancer, left lower lobe (HCC) 08/30/2022   Mass of left lung 08/21/2022   Infection of vascular bypass graft (HCC) 08/16/2022   Typical atrial flutter (HCC)    Non-healing left groin open wound, initial encounter 07/06/2019   Status post insertion of iliac artery stent 06/08/2019   Peripheral vascular disease (HCC) 06/08/2019    Orientation RESPIRATION BLADDER Height & Weight     Self, Time, Situation, Place  O2 (Nasal cannula 2L) External catheter, Incontinent Weight: 160 lb (72.6 kg) Height:  5\' 8"  (172.7 cm)  BEHAVIORAL SYMPTOMS/MOOD  NEUROLOGICAL BOWEL NUTRITION STATUS      Continent Diet (See dc summary)  AMBULATORY STATUS COMMUNICATION OF NEEDS Skin   Extensive Assist Verbally Normal                       Personal Care Assistance Level of Assistance  Bathing, Feeding, Dressing Bathing Assistance: Maximum assistance Feeding assistance: Maximum assistance Dressing Assistance: Maximum assistance     Functional Limitations Info             SPECIAL CARE FACTORS FREQUENCY  PT (By licensed PT), OT (By licensed OT), Speech therapy     PT Frequency: 5x OT Frequency: 5x     Speech Therapy Frequency: 1x      Contractures Contractures Info: Not present    Additional Factors Info  Code Status, Allergies Code Status Info: Full Code Allergies Info: Penicillins           Current Medications (12/19/2022):  This is the current hospital active medication list Current Facility-Administered Medications  Medication Dose Route Frequency Provider Last Rate Last Admin   acetaminophen (TYLENOL) tablet 650 mg  650 mg Oral Q6H PRN Zierle-Ghosh, Asia B, DO   650 mg at 12/18/22 2140   Or   acetaminophen (TYLENOL) suppository 650 mg  650 mg Rectal Q6H PRN Zierle-Ghosh, Asia B, DO       albuterol (PROVENTIL) (2.5 MG/3ML) 0.083% nebulizer solution 2.5 mg  2.5 mg Nebulization Q2H PRN Zierle-Ghosh, Asia B, DO       aspirin EC tablet 81 mg  81 mg Oral Daily Zierle-Ghosh, Asia B, DO   81 mg  at 12/19/22 0730   azithromycin (ZITHROMAX) tablet 500 mg  500 mg Oral q1800 Pham, Minh Q, RPH-CPP       cefTRIAXone (ROCEPHIN) 2 g in sodium chloride 0.9 % 100 mL IVPB  2 g Intravenous Q24H Zierle-Ghosh, Asia B, DO   Stopped at 12/18/22 2057   clopidogrel (PLAVIX) tablet 75 mg  75 mg Oral Daily Zierle-Ghosh, Asia B, DO   75 mg at 12/19/22 0730   diltiazem (CARDIZEM CD) 24 hr capsule 180 mg  180 mg Oral Daily Zierle-Ghosh, Asia B, DO   180 mg at 12/19/22 0730   heparin injection 5,000 Units  5,000 Units Subcutaneous Q8H Zierle-Ghosh,  Asia B, DO   5,000 Units at 12/19/22 1353   hydrALAZINE (APRESOLINE) injection 10 mg  10 mg Intravenous Q6H PRN Osvaldo Shipper, MD       metoprolol tartrate (LOPRESSOR) tablet 25 mg  25 mg Oral BID Zierle-Ghosh, Asia B, DO   25 mg at 12/19/22 0731   ondansetron (ZOFRAN) tablet 4 mg  4 mg Oral Q6H PRN Zierle-Ghosh, Asia B, DO       Or   ondansetron (ZOFRAN) injection 4 mg  4 mg Intravenous Q6H PRN Zierle-Ghosh, Asia B, DO       oxyCODONE (Oxy IR/ROXICODONE) immediate release tablet 5 mg  5 mg Oral Q4H PRN Zierle-Ghosh, Asia B, DO       pantoprazole (PROTONIX) EC tablet 40 mg  40 mg Oral Daily Zierle-Ghosh, Asia B, DO   40 mg at 12/19/22 0731   rosuvastatin (CRESTOR) tablet 20 mg  20 mg Oral Daily Zierle-Ghosh, Asia B, DO   20 mg at 12/19/22 4401     Discharge Medications: Please see discharge summary for a list of discharge medications.  Relevant Imaging Results:  Relevant Lab Results:   Additional Information SS#: [id number]  Marliss Coots, LCSW

## 2022-12-19 NOTE — TOC Initial Note (Addendum)
Transition of Care [facility]) - Initial/Assessment Note    Patient Details  Name: Edward Campos MRN: [id number] Date of Birth: 02/11/1933  Transition of Care [facility]) CM/SW Contact:    Mearl Latin, LCSW Phone Number: 12/19/2022, 3:14 PM  Clinical Narrative:                 Patient admitted from [facility] for rehab. CSW will continue to follow for needs.   Expected Discharge Plan: Skilled Nursing Facility Barriers to Discharge: Continued Medical Work up   Patient Goals and CMS Choice            Expected Discharge Plan and Services In-house Referral: Clinical Social Work   Post Acute Care Choice: Skilled Nursing Facility Living arrangements for the past 2 months: Single Family Home, Skilled Nursing Facility                                      Prior Living Arrangements/Services Living arrangements for the past 2 months: Single Family Home, Skilled Nursing Facility   Patient language and need for interpreter reviewed:: Yes        Need for Family Participation in Patient Care: Yes (Comment) Care giver support system in place?: Yes (comment)   Criminal Activity/Legal Involvement Pertinent to Current Situation/Hospitalization: No - Comment as needed  Activities of Daily Living      Permission Sought/Granted                  Emotional Assessment       Orientation: : Oriented to Self, Oriented to Place, Oriented to  Time, Oriented to Situation Alcohol / Substance Use: Not Applicable Psych Involvement: No (comment)  Admission diagnosis:  Sepsis (HCC) [A41.9] Altered mental status, unspecified altered mental status type [R41.82] Patient Active Problem List   Diagnosis Date Noted   Elevated troponin 12/18/2022   Acute respiratory failure with hypoxia (HCC) 12/18/2022   Acute metabolic encephalopathy 12/18/2022   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   MRSA bacteremia 10/27/2022   Sepsis due to methicillin resistant Staphylococcus aureus (MRSA)  (HCC) 10/27/2022   Sepsis (HCC) 10/24/2022   Hypokalemia 10/24/2022   Transaminitis 10/24/2022   Hyponatremia 10/24/2022   AAA (abdominal aortic aneurysm) (HCC) 10/24/2022   PAD (peripheral artery disease) (HCC) 10/24/2022   Infection 10/22/2022   Shock (HCC) 09/25/2022   Pneumonia 09/06/2022   Small cell lung cancer, left lower lobe (HCC) 08/30/2022   Mass of left lung 08/21/2022   Infection of vascular bypass graft (HCC) 08/16/2022   Typical atrial flutter (HCC)    Non-healing left groin open wound, initial encounter 07/06/2019   Status post insertion of iliac artery stent 06/08/2019   Peripheral vascular disease (HCC) 06/08/2019   PCP:  Patient, No Pcp Per Pharmacy:   [id number] - [place], [place] - [address] [place] [address] Phone: [phone] Fax: [phone]  [facility] [phone] - [place], [place] - [address] AT [facility] [address] [place] [address] Phone: [phone] Fax: [phone]  [facility] [place], [place] - [place] [address] [place] [address] Phone: [phone] Fax: [phone]     Social Determinants of Health (SDOH) Social History: SDOH Screenings   Food Insecurity: No Food Insecurity (10/25/2022)  Housing: Low Risk  (10/25/2022)  Transportation Needs: No Transportation Needs (10/25/2022)  Utilities: Not At Risk (10/25/2022)  Tobacco Use: Medium Risk (12/17/2022)   SDOH Interventions:     Readmission Risk Interventions     No data to display

## 2022-12-19 NOTE — Progress Notes (Signed)
Modified Barium Swallow Study  Patient Details  Name: Edward Campos MRN: [id number] Date of Birth: 1932-03-14  Today's Date: 12/19/2022  Modified Barium Swallow completed.  Full report located under Chart Review in the Imaging Section.  History of Present Illness Edward Campos is a 89 y.o. male who presented the ED with a chief complaint of altered mental status. CXR 10/7: "diffuse bilateral interstitial pulmonary opacity, consistent with edema or atypical/viral infection."  Head CT 10/7 with no acute findings. Pt with medical history significant of AAA, anxiety, hyperlipidemia, peripheral vascular disease, small cell lung carcinoma, atrial flutter and more.   Clinical Impression Edward Campos presents with an oropharyngeal dysphagia with trace aspiration (both silent and with delayed cough response) of thin liquids.  There is reduced mobility of the larynx and reduced pharyngeal stripping/base of tongue contraction. This generalized weakness results in diffuse residue of materials after the swallow, spillage of liquids into larynx before and after the swallow, and trace aspiration of thinner liquids.  Cough was weak and not consistently effective in clearing aspiration or penetrant. Efforts to try postural adjustments and supraglottic swallow technique were not effective.  Recommend thickening liquids to nectar for now. Pt would benefit from palliative care consult given recurring pna and poor potential for improvement in swallow function. D/W Dr. Thedore Mins. Will follow. Factors that may increase risk of adverse event in presence of aspiration Rubye Oaks & Clearance Coots 2021): Poor general health and/or compromised immunity;Respiratory or GI disease;Limited mobility;Frail or deconditioned;Frequent aspiration of large volumes  Swallow Evaluation Recommendations Recommendations: PO diet PO Diet Recommendation: Regular;Mildly thick liquids (Level 2, nectar thick) Liquid Administration via: Cup;Straw Medication  Administration: Crushed with puree Supervision: Patient able to self-feed Swallowing strategies  : Small bites/sips;Slow rate Postural changes: Position pt fully upright for meals Oral care recommendations: Oral care BID (2x/day)    Fonnie Crookshanks L. Samson Frederic, MA CCC/SLP Clinical Specialist - Acute Care SLP Acute Rehabilitation Services Office number [phone]   Blenda Mounts Laurice 12/19/2022,11:43 AM

## 2022-12-19 NOTE — Progress Notes (Signed)
Speech Language Pathology Treatment: Dysphagia  Patient Details Name: Edward Campos MRN: [id number] DOB: January 31, 1933 Today's Date: 12/19/2022 Time: 7829-5621 SLP Time Calculation (min) (ACUTE ONLY): 12 min  Assessment / Plan / Recommendation Clinical Impression  Followed-up with pt after MBS to talk with him and his family about results, specifically the issues surrounding his silent aspiration and concerns for adverse consequences associated with aspiration. He was eating his lunch, coughing after some delay with thin liquids. We talked about the benefits of modifying liquids to nectar temporarily while he recovers from acute illness, acknowledging that restricting his diet is not a long-term solution.  He verbalized agreement and was amenable to trying some nectar thick liquids.  Palliative Care consult is pending.  SLP will follow along for GOC and recommendations.   HPI HPI: Edward Campos is a 89 y.o. male who presented the ED with a chief complaint of altered mental status. CXR 10/7: "diffuse bilateral interstitial pulmonary opacity, consistent with edema or atypical/viral infection."  Head CT 10/7 with no acute findings. Pt with medical history significant of AAA, anxiety, hyperlipidemia, peripheral vascular disease, small cell lung carcinoma, atrial flutter and more.      SLP Plan  Continue with current plan of care      Recommendations for follow up therapy are one component of a multi-disciplinary discharge planning process, led by the attending physician.  Recommendations may be updated based on patient status, additional functional criteria and insurance authorization.    Recommendations  Diet recommendations: Regular;Nectar-thick liquid Liquids provided via: Cup;Straw Medication Administration: Crushed with puree Supervision: Patient able to self feed Compensations: Slow rate;Small sips/bites                  Oral care BID     Dysphagia, oropharyngeal phase (R13.12)      Continue with current plan of care    Jaimey Franchini L. Samson Frederic, MA CCC/SLP Clinical Specialist - Acute Care SLP Acute Rehabilitation Services Office number [phone]  Blenda Mounts Laurice  12/19/2022, 2:14 PM

## 2022-12-20 ENCOUNTER — Ambulatory Visit: Payer: Medicare Other

## 2022-12-20 DIAGNOSIS — C3490 Malignant neoplasm of unspecified part of unspecified bronchus or lung: Secondary | ICD-10-CM

## 2022-12-20 DIAGNOSIS — R6520 Severe sepsis without septic shock: Secondary | ICD-10-CM | POA: Diagnosis not present

## 2022-12-20 DIAGNOSIS — J9601 Acute respiratory failure with hypoxia: Secondary | ICD-10-CM | POA: Diagnosis not present

## 2022-12-20 DIAGNOSIS — A4102 Sepsis due to Methicillin resistant Staphylococcus aureus: Secondary | ICD-10-CM | POA: Diagnosis not present

## 2022-12-20 DIAGNOSIS — C7951 Secondary malignant neoplasm of bone: Secondary | ICD-10-CM | POA: Diagnosis not present

## 2022-12-20 DIAGNOSIS — Z515 Encounter for palliative care: Secondary | ICD-10-CM | POA: Diagnosis not present

## 2022-12-20 LAB — MAGNESIUM: Magnesium: 1.9 mg/dL (ref 1.7–2.4)

## 2022-12-20 LAB — CBC WITH DIFFERENTIAL/PLATELET
Abs Immature Granulocytes: 0.1 10*3/uL — ABNORMAL HIGH (ref 0.00–0.07)
Basophils Absolute: 0 10*3/uL (ref 0.0–0.1)
Basophils Relative: 1 %
Eosinophils Absolute: 0 10*3/uL (ref 0.0–0.5)
Eosinophils Relative: 1 %
HCT: 32.1 % — ABNORMAL LOW (ref 39.0–52.0)
Hemoglobin: 10.4 g/dL — ABNORMAL LOW (ref 13.0–17.0)
Immature Granulocytes: 1 %
Lymphocytes Relative: 9 %
Lymphs Abs: 0.6 10*3/uL — ABNORMAL LOW (ref 0.7–4.0)
MCH: 28.5 pg (ref 26.0–34.0)
MCHC: 32.4 g/dL (ref 30.0–36.0)
MCV: 87.9 fL (ref 80.0–100.0)
Monocytes Absolute: 0.4 10*3/uL (ref 0.1–1.0)
Monocytes Relative: 6 %
Neutro Abs: 5.9 10*3/uL (ref 1.7–7.7)
Neutrophils Relative %: 82 %
Platelets: 310 10*3/uL (ref 150–400)
RBC: 3.65 MIL/uL — ABNORMAL LOW (ref 4.22–5.81)
RDW: 17 % — ABNORMAL HIGH (ref 11.5–15.5)
WBC: 7.1 10*3/uL (ref 4.0–10.5)
nRBC: 0 % (ref 0.0–0.2)

## 2022-12-20 LAB — BASIC METABOLIC PANEL
Anion gap: 13 (ref 5–15)
BUN: 23 mg/dL (ref 8–23)
CO2: 25 mmol/L (ref 22–32)
Calcium: 10 mg/dL (ref 8.9–10.3)
Chloride: 106 mmol/L (ref 98–111)
Creatinine, Ser: 1.04 mg/dL (ref 0.61–1.24)
GFR, Estimated: >60 mL/min (ref >60)
Glucose, Bld: 108 mg/dL — ABNORMAL HIGH (ref 70–99)
Potassium: 4.1 mmol/L (ref 3.5–5.1)
Sodium: 144 mmol/L (ref 135–145)

## 2022-12-20 LAB — C-REACTIVE PROTEIN: CRP: 5.2 mg/dL — ABNORMAL HIGH (ref <1.0)

## 2022-12-20 LAB — BRAIN NATRIURETIC PEPTIDE: B Natriuretic Peptide: 341.6 pg/mL — ABNORMAL HIGH (ref 0.0–100.0)

## 2022-12-20 LAB — PROCALCITONIN: Procalcitonin: 0.95 ng/mL

## 2022-12-20 NOTE — Consult Note (Signed)
Consultation Note Date: 12/20/2022   Patient Name: Edward Campos  DOB: Jul 17, 1932  MRN: [id number]  Age / Sex: 89 y.o., male  PCP: Patient, No Pcp Per Referring Physician: Leroy Sea, MD  Reason for Consultation:  lung cancer, aspiration pneumonia  HPI/Patient Profile: 89 y.o. male  with past medical history of stage IV lung cancer with mets to bones -saw Dr. Ellin Saba in July with plans to start chemotherapy however has had multiple hospitalizations since then and has been unable to start, status post excision of infected femoral bypass has required extensive wound care, wounds are now healed, hyperlipidemia, atrial flutter, admitted on 12/17/2022 with pneumonia.  MBS showed silent trace aspiration of thin liquids. SLP consulted with recommendations for nectar thick liquids and regular diet.  Palliative medicine consulted for goals of care  Primary Decision Maker PATIENT -he has advanced directives on his chart indicating his daughter, Francis Dowse is HCPOA  Discussion: I have reviewed medical records including Care Everywhere, progress notes from this and prior admissions, labs and imaging, discussed with RN.  On evaluation patient is awake and alert.  He is eating.  He continues to be mildly confused with some inappropriate answers to questions.  I had discussion with both him and his daughter.  I introduced Palliative Medicine as specialized medical care for people living with serious illness. It focuses on providing relief from the symptoms and stress of a serious illness. The goal is to improve quality of life for both the patient and the family.  We discussed a brief life review of the patient.  He previously worked as a Paramedic.  Prior to this admission he was in rehab at [facility] nursing facility.  As far as functional and nutritional status -he has had some ongoing weight loss.   Was walking with a walker.  PT eval this admission has some dyspnea with exertion.   We discussed patient's current illness and what it means in the larger context of patient's on-going co-morbidities.  Natural disease trajectory and expectations at EOL were discussed.  We discussed that he has a stage IV cancer.  He has expressed to his daughter he does not want chemotherapy for this cancer.  We also discussed his pneumonia and the risk of it recurring due to his aspiration.  I attempted to elicit values and goals of care important to the patient.  While he does not want to receive chemotherapy for his cancer he does wish to continue aggressive medical interventions aimed at prolonging his time.  Advance directives, concepts specific to code status, artificial feeding and hydration, and rehospitalization were considered and discussed.  He wishes to remain full CODE STATUS and to receive CPR if his heart were to stop and he were to stop breathing.  He would not want to be maintained artificially long-term on life support.  He does not want to have a feeding tube.  Encouraged patient/family to consider DNR/DNI status understanding evidenced based poor outcomes in similar hospitalized patients, as the cause of the arrest  is likely associated with chronic/terminal disease rather than a reversible acute cardio-pulmonary event.    Discussed with patient/family the importance of continued conversation with family and the medical providers regarding overall plan of care and treatment options, ensuring decisions are within the context of the patient's values and GOCs.    During our conversation patient's daughter received a call from social work, daughter is understanding that patient would have to pay a co-pay to go back to facility and would not be able to go to [facility].  He is not able to be at home.   Hospice and Palliative Care services outpatient were explained and offered. Hard choices book was  provided.  We discussed that given his cancer diagnosis and not wanting to pursue treatment for cancer that he would be eligible for hospice services.  However, hospice services does not provide 24-hour care in the home. Their goal is for him to go to rehab, and then convert to long term care with hospice services.   Questions and concerns were addressed. The family was encouraged to call with questions or concerns.    SUMMARY OF RECOMMENDATIONS -Continue current interventions -Full code -Goals of care are to continue to treat what is treatable- go to rehab, then convert to LTC and hospice -Family appreciates assistance from social work regarding what may be a difficult disposition -PMT will continue to follow -Recommend referral for outpatient to see at SNF in anticipation of eventual transition to hospice  -Addendum- I spoke to [place] LCSW with [facility]- clarified that patient can and is going back to [facility] and has a secondary insurance to pay copay. I clarified with Wynona Canes and she was pleased.   Code Status/Advance Care Planning: Full code   Prognosis:   Unable to determine  Discharge Planning: To Be Determined  Primary Diagnoses: Present on Admission:  Sepsis (HCC)  Hypokalemia  PAD (peripheral artery disease) (HCC)  Typical atrial flutter (HCC)   Review of Systems  Constitutional:  Positive for fatigue.  Psychiatric/Behavioral:  Positive for confusion. Negative for agitation and behavioral problems.     Physical Exam Vitals and nursing note reviewed.  Constitutional:      General: He is not in acute distress. Cardiovascular:     Rate and Rhythm: Normal rate.  Pulmonary:     Effort: Pulmonary effort is normal.  Neurological:     Mental Status: He is alert.     Comments: Mildly confused     Vital Signs: BP 123/65 (BP Location: Right Arm)   Pulse 66   Temp 97.7 F (36.5 C) (Oral)   Resp 19   Ht 5\' 8"  (1.727 m)   Wt 72.6 kg   SpO2 92%   BMI 24.33 kg/m   Pain Scale: 0-10   Pain Score: 0-No pain   SpO2: SpO2: 92 % O2 Device:SpO2: 92 % O2 Flow Rate: .O2 Flow Rate (L/min): 2 L/min  IO: Intake/output summary:  Intake/Output Summary (Last 24 hours) at 12/20/2022 1511 Last data filed at 12/20/2022 0300 Gross per 24 hour  Intake --  Output 700 ml  Net -700 ml    LBM: Last BM Date : 12/20/22 Baseline Weight: Weight: 72.6 kg Most recent weight: Weight: 72.6 kg       Thank you for this consult. Palliative medicine will continue to follow and assist as needed.  Time Total: 80 minutes Signed by: Ocie Bob, AGNP-C Palliative Medicine  Time includes:   Preparing to see the patient (e.g., review of tests) Obtaining and/or  reviewing separately obtained history Performing a medically necessary appropriate examination and/or evaluation Counseling and educating the patient/family/caregiver Ordering medications, tests, or procedures Referring and communicating with other health care professionals (when not reported separately) Documenting clinical information in the electronic or other health record Independently interpreting results (not reported separately) and communicating results to the patient/family/caregiver Care coordination (not reported separately) Clinical documentation   Please contact Palliative Medicine Team phone at [phone] for questions and concerns.  For individual provider: See Loretha Stapler

## 2022-12-20 NOTE — Care Management Important Message (Signed)
Important Message  Patient Details  Name: Edward Campos MRN: [id number] Date of Birth: 09/27/1932   Important Message Given:  Yes - Medicare IM Called and spoke with the patient daughter to advised of the IM she as me to send it to her address and     Edward Campos 12/20/2022, 3:30 PM

## 2022-12-20 NOTE — Consult Note (Signed)
Value-Based Care Institute  [facility] Inpatient Consult   12/20/2022  Edward Campos [birth year] [id number]  [facility] [[facility]]  Accountable Care Organization [ACO] Patient: Medicare ACO REACH  Primary Care Provider: Patient, No Pcp Per Is listed in EPIC, however Edward Pizza, MD] is the provider that patient has been trying to see per daughter [facility] Liaison screened the patient remotely at [facility].    The patient was screened for  day readmission hospitalization with noted high risk score for unplanned readmission risk 4 hospital admissions in 6 months.  The patient was assessed for potential [facility]) Care Management service needs for post hospital transition for care coordination. Review of patient's electronic medical record reveals patient is with some confusion and from [facility].   Attempts to reach family at bedside but unable, called and spoke with daughter [name] [phone], HIPAA verified.  She states patient was initially at [facility] and after 1 week transferred to [facility].  She states she is hopefull for patient to return to [facility] but doesn't really understand all that's involved.  Explained that the inpatient [facility] team usually social worker will be assisting with needs.  Explained if patient returns to SNF for rehab or home that the [facility] coordinator can assist with post hospital/facility needs, as well.  She was accepting and expressed appreciation for call.  Plan: [facility] Liaison will continue to follow progress and disposition to asess for post hospital community care coordination/management needs.  Referral request for community care coordination: pending disposition and for PCP needs   Community Care Management/Population Health does not replace or interfere with any arrangements made by the Inpatient Transition of Care team.   For questions contact:   Charlesetta Shanks, RN, BSN,  CCM [place]  [facility], [facility] Liaison Direct Dial: [phone] or secure chat Website: [email or web address]

## 2022-12-20 NOTE — Progress Notes (Signed)
[facility] [place] PROGRESS NOTE  ASSESSMENT / PLAN: Edward Campos is a 89 y.o. male status post excision of infected femoral-femoral bypass and replacement with cryovein 10/25/22.  Postoperative course complicated by pseudoaneurysm requiring takeback and bilateral sartorius flaps 10/31/22.   He is admitted to the hospital for altered mental status and likely aspiration pneumonia. He is recovering slowly. He is deconditioned. His incisions appear healed. Will check ABI and duplex. Follow up with Dr. Karin Lieu in next 3 months assuming these are normal.  SUBJECTIVE: No complaints. Wants to go home.  OBJECTIVE: BP 123/65 (BP Location: Right Arm)   Pulse 66   Temp 97.7 F (36.5 C) (Oral)   Resp 19   Ht 5\' 8"  (1.727 m)   Wt 72.6 kg   SpO2 92%   BMI 24.33 kg/m   Intake/Output Summary (Last 24 hours) at 12/20/2022 1319 Last data filed at 12/20/2022 0300 Gross per 24 hour  Intake --  Output 700 ml  Net -700 ml    Chronically ill. Cachectic. Appears near end of life. Regular rate and rhythm Unlabored breathing Groin incisions healed 2+ femoral pulses bilaterally     Latest Ref Rng & Units 12/20/2022    6:17 AM 12/19/2022    3:35 AM 12/18/2022    3:18 AM  CBC  WBC 4.0 - 10.5 K/uL 7.1  7.7  10.4   Hemoglobin 13.0 - 17.0 g/dL 16.1  09.6  04.5   Hematocrit 39.0 - 52.0 % 32.1  31.8  31.5   Platelets 150 - 400 K/uL 310  263  243         Latest Ref Rng & Units 12/20/2022    6:17 AM 12/19/2022    3:35 AM 12/18/2022    3:18 AM  CMP  Glucose 70 - 99 mg/dL 409  98  92   BUN 8 - 23 mg/dL 23  25  26    Creatinine 0.61 - 1.24 mg/dL 8.11  9.14  7.82   Sodium 135 - 145 mmol/L 144  143  143   Potassium 3.5 - 5.1 mmol/L 4.1  3.3  3.4   Chloride 98 - 111 mmol/L 106  110  107   CO2 22 - 32 mmol/L 25  25  21    Calcium 8.9 - 10.3 mg/dL 95.6  9.4  8.9   Total Protein 6.5 - 8.1 g/dL   5.4   Total Bilirubin 0.3 - 1.2 mg/dL   0.9   Alkaline Phos 38 - 126 U/L   71   AST  15 - 41 U/L   22   ALT 0 - 44 U/L   16     Estimated Creatinine Clearance: 47.5 mL/min (by C-G formula based on SCr of 1.04 mg/dL).  Rande Brunt. Lenell Antu, MD [facility] Phone Number: [phone] 12/20/2022 1:19 PM

## 2022-12-20 NOTE — TOC Progression Note (Signed)
Transition of Care [facility]) - Progression Note    Patient Details  Name: Edward Campos MRN: [id number] Date of Birth: [birth year]  Transition of Care [facility] CM/SW Contact  Mearl Latin, LCSW Phone Number: 12/20/2022, 2:49 PM  Clinical Narrative:    CSW spoke with patient's daughter/HCPOA and answered questions about patient's return to [facility] once medically stable. Will continue to follow.    Expected Discharge Plan: Skilled Nursing Facility Barriers to Discharge: Continued Medical Work up  Expected Discharge Plan and Services In-house Referral: Clinical Social Work   Post Acute Care Choice: Skilled Nursing Facility Living arrangements for the past 2 months: Single Family Home, Skilled Nursing Facility                                       Social Determinants of Health (SDOH) Interventions SDOH Screenings   Food Insecurity: No Food Insecurity (10/25/2022)  Housing: Low Risk  (10/25/2022)  Transportation Needs: No Transportation Needs (10/25/2022)  Utilities: Not At Risk (10/25/2022)  Tobacco Use: Medium Risk (12/17/2022)    Readmission Risk Interventions     No data to display

## 2022-12-20 NOTE — Plan of Care (Signed)
  Problem: Activity: Goal: Ability to tolerate increased activity will improve Outcome: Progressing   Problem: Activity: Goal: Ability to tolerate increased activity will improve Outcome: Progressing   Problem: Respiratory: Goal: Ability to maintain adequate ventilation will improve Outcome: Progressing   Problem: Education: Goal: Knowledge of General Education information will improve Description: Including pain rating scale, medication(s)/side effects and non-pharmacologic comfort measures Outcome: Progressing   Problem: Clinical Measurements: Goal: Diagnostic test results will improve Outcome: Progressing

## 2022-12-20 NOTE — Progress Notes (Signed)
PROGRESS NOTE                                                                                                                                                                                                             Patient Demographics:    Edward Campos, is a 89 y.o. male, DOB - [birth year], [id number]  Outpatient Primary MD for the patient is Patient, No Pcp Per    LOS - 3  Admit date - 12/17/2022    Chief Complaint  Patient presents with   Altered Mental Status       Brief Narrative (HPI from H&P)   89 y.o. male with medical history significant of AAA, anxiety, hyperlipidemia, peripheral vascular disease, small cell lung carcinoma, atrial flutter and more presented to the ED with a chief complaint of altered mental status.  He is from a nursing facility and staff there reported onset of shortness of breath over the last few days.    Subjective:   Patient in bed, appears comfortable, denies any headache, no fever, no chest pain or pressure, no shortness of breath , no abdominal pain. No new focal weakness.   Assessment  & Plan :   Acute metabolic encephalopathy due to sepsis from community-acquired pneumonia/aspiration pneumonia with acute hypoxic respiratory failure in the presence of left small cell cancer.  Apparently at baseline patient has normal mentation.  Head CT unremarkable, no focal deficits, continue supportive care, minimize narcotics and benzodiazepines.    Sepsis from community-acquired pneumonia/aspiration pneumonia with acute hypoxic respiratory failure in the presence of left small cell cancer.     Pneumonia could be due to microaspiration versus postobstructive pneumonia from underlying small cell cancer, mildly elevated CRP and procalcitonin, with empiric antibiotics improving, therapy following microaspiration confirmed, discussed with daughter, will also involve palliative care for long-term  goals of care, encouraged to sit in chair use I-S and flutter valve for pulmonary toiletry.  Follow cultures, post discharge follow-up with oncology as soon as possible to commence treatment for underlying small cell cancer, according to daughter he had initially refused following up with oncologist few weeks ago, again palliative care will be involved for goals of care.  History of small cell lung cancer  Followed by oncology in .  Follow-up postdischarge.  Elevated troponin  Recent echocardiogram showed normal systolic function.  Chest pain-free,  PROGRESS NOTE                                                                                                                                                                                                             Patient Demographics:    Edward Campos, is a 89 y.o. male, DOB - [birth year], [id number]  Outpatient Primary MD for the patient is Patient, No Pcp Per    LOS - 3  Admit date - 12/17/2022    Chief Complaint  Patient presents with   Altered Mental Status       Brief Narrative (HPI from H&P)   89 y.o. male with medical history significant of AAA, anxiety, hyperlipidemia, peripheral vascular disease, small cell lung carcinoma, atrial flutter and more presented to the ED with a chief complaint of altered mental status.  He is from a nursing facility and staff there reported onset of shortness of breath over the last few days.    Subjective:   Patient in bed, appears comfortable, denies any headache, no fever, no chest pain or pressure, no shortness of breath , no abdominal pain. No new focal weakness.   Assessment  & Plan :   Acute metabolic encephalopathy due to sepsis from community-acquired pneumonia/aspiration pneumonia with acute hypoxic respiratory failure in the presence of left small cell cancer.  Apparently at baseline patient has normal mentation.  Head CT unremarkable, no focal deficits, continue supportive care, minimize narcotics and benzodiazepines.    Sepsis from community-acquired pneumonia/aspiration pneumonia with acute hypoxic respiratory failure in the presence of left small cell cancer.     Pneumonia could be due to microaspiration versus postobstructive pneumonia from underlying small cell cancer, mildly elevated CRP and procalcitonin, with empiric antibiotics improving, therapy following microaspiration confirmed, discussed with daughter, will also involve palliative care for long-term  goals of care, encouraged to sit in chair use I-S and flutter valve for pulmonary toiletry.  Follow cultures, post discharge follow-up with oncology as soon as possible to commence treatment for underlying small cell cancer, according to daughter he had initially refused following up with oncologist few weeks ago, again palliative care will be involved for goals of care.  History of small cell lung cancer  Followed by oncology in .  Follow-up postdischarge.  Elevated troponin  Recent echocardiogram showed normal systolic function.  Chest pain-free,  PROGRESS NOTE                                                                                                                                                                                                             Patient Demographics:    Edward Campos, is a 89 y.o. male, DOB - [birth year], [id number]  Outpatient Primary MD for the patient is Patient, No Pcp Per    LOS - 3  Admit date - 12/17/2022    Chief Complaint  Patient presents with   Altered Mental Status       Brief Narrative (HPI from H&P)   89 y.o. male with medical history significant of AAA, anxiety, hyperlipidemia, peripheral vascular disease, small cell lung carcinoma, atrial flutter and more presented to the ED with a chief complaint of altered mental status.  He is from a nursing facility and staff there reported onset of shortness of breath over the last few days.    Subjective:   Patient in bed, appears comfortable, denies any headache, no fever, no chest pain or pressure, no shortness of breath , no abdominal pain. No new focal weakness.   Assessment  & Plan :   Acute metabolic encephalopathy due to sepsis from community-acquired pneumonia/aspiration pneumonia with acute hypoxic respiratory failure in the presence of left small cell cancer.  Apparently at baseline patient has normal mentation.  Head CT unremarkable, no focal deficits, continue supportive care, minimize narcotics and benzodiazepines.    Sepsis from community-acquired pneumonia/aspiration pneumonia with acute hypoxic respiratory failure in the presence of left small cell cancer.     Pneumonia could be due to microaspiration versus postobstructive pneumonia from underlying small cell cancer, mildly elevated CRP and procalcitonin, with empiric antibiotics improving, therapy following microaspiration confirmed, discussed with daughter, will also involve palliative care for long-term  goals of care, encouraged to sit in chair use I-S and flutter valve for pulmonary toiletry.  Follow cultures, post discharge follow-up with oncology as soon as possible to commence treatment for underlying small cell cancer, according to daughter he had initially refused following up with oncologist few weeks ago, again palliative care will be involved for goals of care.  History of small cell lung cancer  Followed by oncology in .  Follow-up postdischarge.  Elevated troponin  Recent echocardiogram showed normal systolic function.  Chest pain-free,  PROGRESS NOTE                                                                                                                                                                                                             Patient Demographics:    Edward Campos, is a 89 y.o. male, DOB - [birth year], [id number]  Outpatient Primary MD for the patient is Patient, No Pcp Per    LOS - 3  Admit date - 12/17/2022    Chief Complaint  Patient presents with   Altered Mental Status       Brief Narrative (HPI from H&P)   89 y.o. male with medical history significant of AAA, anxiety, hyperlipidemia, peripheral vascular disease, small cell lung carcinoma, atrial flutter and more presented to the ED with a chief complaint of altered mental status.  He is from a nursing facility and staff there reported onset of shortness of breath over the last few days.    Subjective:   Patient in bed, appears comfortable, denies any headache, no fever, no chest pain or pressure, no shortness of breath , no abdominal pain. No new focal weakness.   Assessment  & Plan :   Acute metabolic encephalopathy due to sepsis from community-acquired pneumonia/aspiration pneumonia with acute hypoxic respiratory failure in the presence of left small cell cancer.  Apparently at baseline patient has normal mentation.  Head CT unremarkable, no focal deficits, continue supportive care, minimize narcotics and benzodiazepines.    Sepsis from community-acquired pneumonia/aspiration pneumonia with acute hypoxic respiratory failure in the presence of left small cell cancer.     Pneumonia could be due to microaspiration versus postobstructive pneumonia from underlying small cell cancer, mildly elevated CRP and procalcitonin, with empiric antibiotics improving, therapy following microaspiration confirmed, discussed with daughter, will also involve palliative care for long-term  goals of care, encouraged to sit in chair use I-S and flutter valve for pulmonary toiletry.  Follow cultures, post discharge follow-up with oncology as soon as possible to commence treatment for underlying small cell cancer, according to daughter he had initially refused following up with oncologist few weeks ago, again palliative care will be involved for goals of care.  History of small cell lung cancer  Followed by oncology in .  Follow-up postdischarge.  Elevated troponin  Recent echocardiogram showed normal systolic function.  Chest pain-free,  BNP  --  421.2*  --   --  301.2*  --  341.6*  MG  --   --   --  1.7 2.1  --  1.9  CALCIUM 9.5  --   --  8.9 9.4  --  10.0     --------------------------------------------------------------------------------------------------------------- Lab Results  Component Value Date   CHOL 58 10/26/2022   HDL 15 (L) 10/26/2022   LDLCALC 29 10/26/2022   TRIG 68 10/26/2022   CHOLHDL 3.9 10/26/2022    Lab Results  Component Value Date   HGBA1C 5.0 05/29/2019    Radiology Reports DG Swallowing Func-Speech Pathology  Result Date: 12/19/2022 Table formatting from the original result was not included. Modified Barium Swallow Study Patient Details Name: Matyas Baisley MRN: [id number] Date of Birth: 1932-05-15 Today's Date: 12/19/2022 HPI/PMH: HPI: Sirius Woodford is a 89 y.o. male who presented the ED with a chief complaint of altered mental status. CXR 10/7: "diffuse bilateral interstitial pulmonary opacity, consistent with edema or atypical/viral infection."  Head CT 10/7 with no acute findings. Pt with medical history significant of AAA, anxiety, hyperlipidemia, peripheral vascular disease, small cell lung carcinoma, atrial flutter and more. Clinical Impression: Clinical Impression: Mr. Swayze presents with an oropharyngeal dysphagia with trace aspiration (both silent and with delayed cough response) of thin liquids.  There is reduced mobility of the larynx and reduced pharyngeal stripping/base of tongue contraction. This generalized weakness results in diffuse residue of materials after the swallow, spillage of liquids into larynx before and after the swallow, and trace aspiration of thinner liquids.  Cough was weak and not consistently effective in clearing aspiration or penetrant. Efforts to try postural adjustments and supraglottic swallow technique were not effective.  Recommend thickening liquids to nectar for now. Pt would benefit from palliative care consult given recurring pna and poor potential for improvement in swallow function. D/W Dr. Thedore Mins. Will follow. Factors that may increase risk of adverse event in presence of  aspiration Rubye Oaks & Clearance Coots 2021): Factors that may increase risk of adverse event in presence of aspiration Rubye Oaks & Clearance Coots 2021): Poor general health and/or compromised immunity; Respiratory or GI disease; Limited mobility; Frail or deconditioned; Frequent aspiration of large volumes Recommendations/Plan: Swallowing Evaluation Recommendations Swallowing Evaluation Recommendations Recommendations: PO diet PO Diet Recommendation: Regular; Mildly thick liquids (Level 2, nectar thick) Liquid Administration via: Cup; Straw Medication Administration: Crushed with puree Supervision: Patient able to self-feed Swallowing strategies  : Small bites/sips; Slow rate Postural changes: Position pt fully upright for meals Oral care recommendations: Oral care BID (2x/day) Treatment Plan Treatment Plan Treatment recommendations: Therapy as outlined in treatment plan below Follow-up recommendations: Other (comment) (tba) Functional status assessment: Patient has had a recent decline in their functional status and demonstrates the ability to make significant improvements in function in a reasonable and predictable amount of time. Treatment frequency: Min 2x/week Treatment duration: 2 weeks Interventions: Patient/family education; Oropharyngeal exercises Recommendations Recommendations for follow up therapy are one component of a multi-disciplinary discharge planning process, led by the attending physician.  Recommendations may be updated based on patient status, additional functional criteria and insurance authorization. Assessment: Orofacial Exam: Orofacial Exam Oral Cavity: Oral Hygiene: WFL Oral Cavity - Dentition: Dentures, top Orofacial Anatomy: WFL Oral Motor/Sensory Function: WFL Anatomy: Anatomy: Suspected cervical osteophytes Boluses Administered: Boluses Administered Boluses Administered: Thin liquids (Level 0); Mildly thick liquids (Level 2, nectar thick); Moderately thick liquids (Level 3, honey thick); Puree; Solid   Oral Impairment Domain: Oral Impairment Domain Lip Closure: No labial escape  PROGRESS NOTE                                                                                                                                                                                                             Patient Demographics:    Edward Campos, is a 89 y.o. male, DOB - [birth year], [id number]  Outpatient Primary MD for the patient is Patient, No Pcp Per    LOS - 3  Admit date - 12/17/2022    Chief Complaint  Patient presents with   Altered Mental Status       Brief Narrative (HPI from H&P)   89 y.o. male with medical history significant of AAA, anxiety, hyperlipidemia, peripheral vascular disease, small cell lung carcinoma, atrial flutter and more presented to the ED with a chief complaint of altered mental status.  He is from a nursing facility and staff there reported onset of shortness of breath over the last few days.    Subjective:   Patient in bed, appears comfortable, denies any headache, no fever, no chest pain or pressure, no shortness of breath , no abdominal pain. No new focal weakness.   Assessment  & Plan :   Acute metabolic encephalopathy due to sepsis from community-acquired pneumonia/aspiration pneumonia with acute hypoxic respiratory failure in the presence of left small cell cancer.  Apparently at baseline patient has normal mentation.  Head CT unremarkable, no focal deficits, continue supportive care, minimize narcotics and benzodiazepines.    Sepsis from community-acquired pneumonia/aspiration pneumonia with acute hypoxic respiratory failure in the presence of left small cell cancer.     Pneumonia could be due to microaspiration versus postobstructive pneumonia from underlying small cell cancer, mildly elevated CRP and procalcitonin, with empiric antibiotics improving, therapy following microaspiration confirmed, discussed with daughter, will also involve palliative care for long-term  goals of care, encouraged to sit in chair use I-S and flutter valve for pulmonary toiletry.  Follow cultures, post discharge follow-up with oncology as soon as possible to commence treatment for underlying small cell cancer, according to daughter he had initially refused following up with oncologist few weeks ago, again palliative care will be involved for goals of care.  History of small cell lung cancer  Followed by oncology in .  Follow-up postdischarge.  Elevated troponin  Recent echocardiogram showed normal systolic function.  Chest pain-free,  PROGRESS NOTE                                                                                                                                                                                                             Patient Demographics:    Edward Campos, is a 89 y.o. male, DOB - [birth year], [id number]  Outpatient Primary MD for the patient is Patient, No Pcp Per    LOS - 3  Admit date - 12/17/2022    Chief Complaint  Patient presents with   Altered Mental Status       Brief Narrative (HPI from H&P)   89 y.o. male with medical history significant of AAA, anxiety, hyperlipidemia, peripheral vascular disease, small cell lung carcinoma, atrial flutter and more presented to the ED with a chief complaint of altered mental status.  He is from a nursing facility and staff there reported onset of shortness of breath over the last few days.    Subjective:   Patient in bed, appears comfortable, denies any headache, no fever, no chest pain or pressure, no shortness of breath , no abdominal pain. No new focal weakness.   Assessment  & Plan :   Acute metabolic encephalopathy due to sepsis from community-acquired pneumonia/aspiration pneumonia with acute hypoxic respiratory failure in the presence of left small cell cancer.  Apparently at baseline patient has normal mentation.  Head CT unremarkable, no focal deficits, continue supportive care, minimize narcotics and benzodiazepines.    Sepsis from community-acquired pneumonia/aspiration pneumonia with acute hypoxic respiratory failure in the presence of left small cell cancer.     Pneumonia could be due to microaspiration versus postobstructive pneumonia from underlying small cell cancer, mildly elevated CRP and procalcitonin, with empiric antibiotics improving, therapy following microaspiration confirmed, discussed with daughter, will also involve palliative care for long-term  goals of care, encouraged to sit in chair use I-S and flutter valve for pulmonary toiletry.  Follow cultures, post discharge follow-up with oncology as soon as possible to commence treatment for underlying small cell cancer, according to daughter he had initially refused following up with oncologist few weeks ago, again palliative care will be involved for goals of care.  History of small cell lung cancer  Followed by oncology in .  Follow-up postdischarge.  Elevated troponin  Recent echocardiogram showed normal systolic function.  Chest pain-free,

## 2022-12-21 ENCOUNTER — Inpatient Hospital Stay (HOSPITAL_COMMUNITY): Payer: Medicare Other

## 2022-12-21 ENCOUNTER — Ambulatory Visit: Payer: Medicare Other

## 2022-12-21 DIAGNOSIS — Z515 Encounter for palliative care: Secondary | ICD-10-CM | POA: Diagnosis not present

## 2022-12-21 DIAGNOSIS — G9341 Metabolic encephalopathy: Secondary | ICD-10-CM | POA: Diagnosis not present

## 2022-12-21 DIAGNOSIS — J690 Pneumonitis due to inhalation of food and vomit: Secondary | ICD-10-CM | POA: Diagnosis not present

## 2022-12-21 DIAGNOSIS — J9601 Acute respiratory failure with hypoxia: Secondary | ICD-10-CM | POA: Diagnosis not present

## 2022-12-21 DIAGNOSIS — I739 Peripheral vascular disease, unspecified: Secondary | ICD-10-CM | POA: Diagnosis not present

## 2022-12-21 LAB — CBC WITH DIFFERENTIAL/PLATELET
Abs Immature Granulocytes: 0.17 10*3/uL — ABNORMAL HIGH (ref 0.00–0.07)
Basophils Absolute: 0.1 10*3/uL (ref 0.0–0.1)
Basophils Relative: 1 %
Eosinophils Absolute: 0.1 10*3/uL (ref 0.0–0.5)
Eosinophils Relative: 1 %
HCT: 33 % — ABNORMAL LOW (ref 39.0–52.0)
Hemoglobin: 10.6 g/dL — ABNORMAL LOW (ref 13.0–17.0)
Immature Granulocytes: 3 %
Lymphocytes Relative: 9 %
Lymphs Abs: 0.6 10*3/uL — ABNORMAL LOW (ref 0.7–4.0)
MCH: 27.9 pg (ref 26.0–34.0)
MCHC: 32.1 g/dL (ref 30.0–36.0)
MCV: 86.8 fL (ref 80.0–100.0)
Monocytes Absolute: 0.7 10*3/uL (ref 0.1–1.0)
Monocytes Relative: 10 %
Neutro Abs: 5.1 10*3/uL (ref 1.7–7.7)
Neutrophils Relative %: 76 %
Platelets: 294 10*3/uL (ref 150–400)
RBC: 3.8 MIL/uL — ABNORMAL LOW (ref 4.22–5.81)
RDW: 17 % — ABNORMAL HIGH (ref 11.5–15.5)
WBC: 6.6 10*3/uL (ref 4.0–10.5)
nRBC: 0.3 % — ABNORMAL HIGH (ref 0.0–0.2)

## 2022-12-21 LAB — BASIC METABOLIC PANEL
Anion gap: 9 (ref 5–15)
BUN: 23 mg/dL (ref 8–23)
CO2: 26 mmol/L (ref 22–32)
Calcium: 9.4 mg/dL (ref 8.9–10.3)
Chloride: 107 mmol/L (ref 98–111)
Creatinine, Ser: 1.08 mg/dL (ref 0.61–1.24)
GFR, Estimated: >60 mL/min (ref >60)
Glucose, Bld: 103 mg/dL — ABNORMAL HIGH (ref 70–99)
Potassium: 3.6 mmol/L (ref 3.5–5.1)
Sodium: 142 mmol/L (ref 135–145)

## 2022-12-21 LAB — C-REACTIVE PROTEIN: CRP: 4.6 mg/dL — ABNORMAL HIGH (ref <1.0)

## 2022-12-21 LAB — VAS US ABI WITH/WO TBI: Left ABI: 0.57

## 2022-12-21 LAB — BRAIN NATRIURETIC PEPTIDE: B Natriuretic Peptide: 375.6 pg/mL — ABNORMAL HIGH (ref 0.0–100.0)

## 2022-12-21 LAB — MAGNESIUM: Magnesium: 1.9 mg/dL (ref 1.7–2.4)

## 2022-12-21 LAB — PROCALCITONIN: Procalcitonin: 0.94 ng/mL

## 2022-12-21 NOTE — Progress Notes (Signed)
ABI and LEA completed. Results given to Dr. Hetty Blend.  Marilynne Halsted, BS, RDMS, RVT

## 2022-12-21 NOTE — Plan of Care (Signed)
  Problem: Education: Goal: Knowledge of prescribed regimen will improve Outcome: Progressing   Problem: Activity: Goal: Ability to tolerate increased activity will improve Outcome: Progressing   Problem: Bowel/Gastric: Goal: Gastrointestinal status for postoperative course will improve Outcome: Progressing   Problem: Clinical Measurements: Goal: Postoperative complications will be avoided or minimized Outcome: Progressing Goal: Signs and symptoms of graft occlusion will improve Outcome: Progressing   Problem: Skin Integrity: Goal: Demonstration of wound healing without infection will improve Outcome: Progressing   Problem: Activity: Goal: Ability to tolerate increased activity will improve Outcome: Progressing

## 2022-12-21 NOTE — Progress Notes (Signed)
Recent Labs  Lab 12/17/22 1428 12/17/22 1627 12/17/22 2102 12/18/22 0318 12/19/22 0335 12/19/22 0738  12/20/22 0617 12/21/22 0411  CRP  --   --   --   --   --  13.5* 5.2* 4.6*  PROCALCITON  --   --  0.80  --  1.01  --  0.95 0.94  LATICACIDVEN  --   --  1.9 1.8  --   --   --   --   BNP  --  421.2*  --   --  301.2*  --  341.6* 375.6*  MG  --   --   --  1.7 2.1  --  1.9 1.9  CALCIUM 9.5  --   --  8.9 9.4  --  10.0 9.4    --------------------------------------------------------------------------------------------------------------- Lab Results  Component Value Date   CHOL 58 10/26/2022   HDL 15 (L) 10/26/2022   LDLCALC 29 10/26/2022   TRIG 68 10/26/2022   CHOLHDL 3.9 10/26/2022    Lab Results  Component Value Date   HGBA1C 5.0 05/29/2019    Radiology Reports DG Swallowing Func-Speech Pathology  Result Date: 12/19/2022 Table formatting from the original result was not included. Modified Barium Swallow Study Patient Details Name: Edward Campos MRN: [id number] Date of Birth: 1932-11-30 Today's Date: 12/19/2022 HPI/PMH: HPI: Edward Campos is a 89 y.o. male who presented the ED with a chief complaint of altered mental status. CXR 10/7: "diffuse bilateral interstitial pulmonary opacity, consistent with edema or atypical/viral infection."  Head CT 10/7 with no acute findings. Pt with medical history significant of AAA, anxiety, hyperlipidemia, peripheral vascular disease, small cell lung carcinoma, atrial flutter and more. Clinical Impression: Clinical Impression: Edward Campos presents with an oropharyngeal dysphagia with trace aspiration (both silent and with delayed cough response) of thin liquids.  There is reduced mobility of the larynx and reduced pharyngeal stripping/base of tongue contraction. This generalized weakness results in diffuse residue of materials after the swallow, spillage of liquids into larynx before and after the swallow, and trace aspiration of thinner liquids.  Cough was weak and not consistently effective in clearing aspiration or penetrant. Efforts to try postural adjustments  and supraglottic swallow technique were not effective.  Recommend thickening liquids to nectar for now. Pt would benefit from palliative care consult given recurring pna and poor potential for improvement in swallow function. D/W Dr. Thedore Mins. Will follow. Factors that may increase risk of adverse event in presence of aspiration Rubye Oaks & Clearance Coots 2021): Factors that may increase risk of adverse event in presence of aspiration Rubye Oaks & Clearance Coots 2021): Poor general health and/or compromised immunity; Respiratory or GI disease; Limited mobility; Frail or deconditioned; Frequent aspiration of large volumes Recommendations/Plan: Swallowing Evaluation Recommendations Swallowing Evaluation Recommendations Recommendations: PO diet PO Diet Recommendation: Regular; Mildly thick liquids (Level 2, nectar thick) Liquid Administration via: Cup; Straw Medication Administration: Crushed with puree Supervision: Patient able to self-feed Swallowing strategies  : Small bites/sips; Slow rate Postural changes: Position pt fully upright for meals Oral care recommendations: Oral care BID (2x/day) Treatment Plan Treatment Plan Treatment recommendations: Therapy as outlined in treatment plan below Follow-up recommendations: Other (comment) (tba) Functional status assessment: Patient has had a recent decline in their functional status and demonstrates the ability to make significant improvements in function in a reasonable and predictable amount of time. Treatment frequency: Min 2x/week Treatment duration: 2 weeks Interventions: Patient/family education; Oropharyngeal exercises Recommendations Recommendations for follow up therapy are one component of a multi-disciplinary discharge planning process, led by the  PROGRESS NOTE                                                                                                                                                                                                             Patient Demographics:    Edward Campos, is a 89 y.o. male, DOB - 07/22/1932, YQM:[id number]  Outpatient Primary MD for the patient is Patient, No Pcp Per    LOS - 4  Admit date - 12/17/2022    Chief Complaint  Patient presents with   Altered Mental Status       Brief Narrative (HPI from H&P)   89 y.o. male with medical history significant of AAA, anxiety, hyperlipidemia, peripheral vascular disease, small cell lung carcinoma, atrial flutter and more presented to the ED with a chief complaint of altered mental status.  He is from a nursing facility and staff there reported onset of shortness of breath over the last few days.    Subjective:   Patient in bed, appears comfortable, denies any headache, no fever, no chest pain or pressure, improved cough and shortness of breath , no abdominal pain. No focal weakness.   Assessment  & Plan :   Acute metabolic encephalopathy due to sepsis from community-acquired pneumonia/aspiration pneumonia with acute hypoxic respiratory failure in the presence of left small cell cancer.  Apparently at baseline patient has normal mentation.  Head CT unremarkable, no focal deficits, continue supportive care, minimize narcotics and benzodiazepines.  Now close to baseline.    Sepsis from community-acquired pneumonia/aspiration pneumonia with acute hypoxic respiratory failure in the presence of left small cell cancer.     Pneumonia could be due to microaspiration versus postobstructive pneumonia from underlying small cell cancer, mildly elevated CRP and procalcitonin, with empiric antibiotics improving, therapy following microaspiration confirmed, discussed with daughter, seen by  palliative care this admission, for now continue full code and present line of care, encouraged to sit in chair use I-S and flutter valve for pulmonary toiletry.  Follow cultures, post discharge follow-up with oncology as soon as possible to commence treatment for underlying small cell cancer, according to daughter he had initially refused following up with oncologist few weeks ago, she had palliative care input.  If stable likely discharge on 12/22/2022 with oral antibiotics.  History of small cell lung cancer  Followed by oncology in  Recent Labs  Lab 12/17/22 1428 12/17/22 1627 12/17/22 2102 12/18/22 0318 12/19/22 0335 12/19/22 0738  12/20/22 0617 12/21/22 0411  CRP  --   --   --   --   --  13.5* 5.2* 4.6*  PROCALCITON  --   --  0.80  --  1.01  --  0.95 0.94  LATICACIDVEN  --   --  1.9 1.8  --   --   --   --   BNP  --  421.2*  --   --  301.2*  --  341.6* 375.6*  MG  --   --   --  1.7 2.1  --  1.9 1.9  CALCIUM 9.5  --   --  8.9 9.4  --  10.0 9.4    --------------------------------------------------------------------------------------------------------------- Lab Results  Component Value Date   CHOL 58 10/26/2022   HDL 15 (L) 10/26/2022   LDLCALC 29 10/26/2022   TRIG 68 10/26/2022   CHOLHDL 3.9 10/26/2022    Lab Results  Component Value Date   HGBA1C 5.0 05/29/2019    Radiology Reports DG Swallowing Func-Speech Pathology  Result Date: 12/19/2022 Table formatting from the original result was not included. Modified Barium Swallow Study Patient Details Name: Edward Campos MRN: [id number] Date of Birth: 1932-11-30 Today's Date: 12/19/2022 HPI/PMH: HPI: Edward Campos is a 89 y.o. male who presented the ED with a chief complaint of altered mental status. CXR 10/7: "diffuse bilateral interstitial pulmonary opacity, consistent with edema or atypical/viral infection."  Head CT 10/7 with no acute findings. Pt with medical history significant of AAA, anxiety, hyperlipidemia, peripheral vascular disease, small cell lung carcinoma, atrial flutter and more. Clinical Impression: Clinical Impression: Edward Campos presents with an oropharyngeal dysphagia with trace aspiration (both silent and with delayed cough response) of thin liquids.  There is reduced mobility of the larynx and reduced pharyngeal stripping/base of tongue contraction. This generalized weakness results in diffuse residue of materials after the swallow, spillage of liquids into larynx before and after the swallow, and trace aspiration of thinner liquids.  Cough was weak and not consistently effective in clearing aspiration or penetrant. Efforts to try postural adjustments  and supraglottic swallow technique were not effective.  Recommend thickening liquids to nectar for now. Pt would benefit from palliative care consult given recurring pna and poor potential for improvement in swallow function. D/W Dr. Thedore Mins. Will follow. Factors that may increase risk of adverse event in presence of aspiration Rubye Oaks & Clearance Coots 2021): Factors that may increase risk of adverse event in presence of aspiration Rubye Oaks & Clearance Coots 2021): Poor general health and/or compromised immunity; Respiratory or GI disease; Limited mobility; Frail or deconditioned; Frequent aspiration of large volumes Recommendations/Plan: Swallowing Evaluation Recommendations Swallowing Evaluation Recommendations Recommendations: PO diet PO Diet Recommendation: Regular; Mildly thick liquids (Level 2, nectar thick) Liquid Administration via: Cup; Straw Medication Administration: Crushed with puree Supervision: Patient able to self-feed Swallowing strategies  : Small bites/sips; Slow rate Postural changes: Position pt fully upright for meals Oral care recommendations: Oral care BID (2x/day) Treatment Plan Treatment Plan Treatment recommendations: Therapy as outlined in treatment plan below Follow-up recommendations: Other (comment) (tba) Functional status assessment: Patient has had a recent decline in their functional status and demonstrates the ability to make significant improvements in function in a reasonable and predictable amount of time. Treatment frequency: Min 2x/week Treatment duration: 2 weeks Interventions: Patient/family education; Oropharyngeal exercises Recommendations Recommendations for follow up therapy are one component of a multi-disciplinary discharge planning process, led by the  PROGRESS NOTE                                                                                                                                                                                                             Patient Demographics:    Edward Campos, is a 89 y.o. male, DOB - 07/22/1932, YQM:[id number]  Outpatient Primary MD for the patient is Patient, No Pcp Per    LOS - 4  Admit date - 12/17/2022    Chief Complaint  Patient presents with   Altered Mental Status       Brief Narrative (HPI from H&P)   89 y.o. male with medical history significant of AAA, anxiety, hyperlipidemia, peripheral vascular disease, small cell lung carcinoma, atrial flutter and more presented to the ED with a chief complaint of altered mental status.  He is from a nursing facility and staff there reported onset of shortness of breath over the last few days.    Subjective:   Patient in bed, appears comfortable, denies any headache, no fever, no chest pain or pressure, improved cough and shortness of breath , no abdominal pain. No focal weakness.   Assessment  & Plan :   Acute metabolic encephalopathy due to sepsis from community-acquired pneumonia/aspiration pneumonia with acute hypoxic respiratory failure in the presence of left small cell cancer.  Apparently at baseline patient has normal mentation.  Head CT unremarkable, no focal deficits, continue supportive care, minimize narcotics and benzodiazepines.  Now close to baseline.    Sepsis from community-acquired pneumonia/aspiration pneumonia with acute hypoxic respiratory failure in the presence of left small cell cancer.     Pneumonia could be due to microaspiration versus postobstructive pneumonia from underlying small cell cancer, mildly elevated CRP and procalcitonin, with empiric antibiotics improving, therapy following microaspiration confirmed, discussed with daughter, seen by  palliative care this admission, for now continue full code and present line of care, encouraged to sit in chair use I-S and flutter valve for pulmonary toiletry.  Follow cultures, post discharge follow-up with oncology as soon as possible to commence treatment for underlying small cell cancer, according to daughter he had initially refused following up with oncologist few weeks ago, she had palliative care input.  If stable likely discharge on 12/22/2022 with oral antibiotics.  History of small cell lung cancer  Followed by oncology in  Recent Labs  Lab 12/17/22 1428 12/17/22 1627 12/17/22 2102 12/18/22 0318 12/19/22 0335 12/19/22 0738  12/20/22 0617 12/21/22 0411  CRP  --   --   --   --   --  13.5* 5.2* 4.6*  PROCALCITON  --   --  0.80  --  1.01  --  0.95 0.94  LATICACIDVEN  --   --  1.9 1.8  --   --   --   --   BNP  --  421.2*  --   --  301.2*  --  341.6* 375.6*  MG  --   --   --  1.7 2.1  --  1.9 1.9  CALCIUM 9.5  --   --  8.9 9.4  --  10.0 9.4    --------------------------------------------------------------------------------------------------------------- Lab Results  Component Value Date   CHOL 58 10/26/2022   HDL 15 (L) 10/26/2022   LDLCALC 29 10/26/2022   TRIG 68 10/26/2022   CHOLHDL 3.9 10/26/2022    Lab Results  Component Value Date   HGBA1C 5.0 05/29/2019    Radiology Reports DG Swallowing Func-Speech Pathology  Result Date: 12/19/2022 Table formatting from the original result was not included. Modified Barium Swallow Study Patient Details Name: Edward Campos MRN: [id number] Date of Birth: 1932-11-30 Today's Date: 12/19/2022 HPI/PMH: HPI: Edward Campos is a 89 y.o. male who presented the ED with a chief complaint of altered mental status. CXR 10/7: "diffuse bilateral interstitial pulmonary opacity, consistent with edema or atypical/viral infection."  Head CT 10/7 with no acute findings. Pt with medical history significant of AAA, anxiety, hyperlipidemia, peripheral vascular disease, small cell lung carcinoma, atrial flutter and more. Clinical Impression: Clinical Impression: Edward Campos presents with an oropharyngeal dysphagia with trace aspiration (both silent and with delayed cough response) of thin liquids.  There is reduced mobility of the larynx and reduced pharyngeal stripping/base of tongue contraction. This generalized weakness results in diffuse residue of materials after the swallow, spillage of liquids into larynx before and after the swallow, and trace aspiration of thinner liquids.  Cough was weak and not consistently effective in clearing aspiration or penetrant. Efforts to try postural adjustments  and supraglottic swallow technique were not effective.  Recommend thickening liquids to nectar for now. Pt would benefit from palliative care consult given recurring pna and poor potential for improvement in swallow function. D/W Dr. Thedore Mins. Will follow. Factors that may increase risk of adverse event in presence of aspiration Rubye Oaks & Clearance Coots 2021): Factors that may increase risk of adverse event in presence of aspiration Rubye Oaks & Clearance Coots 2021): Poor general health and/or compromised immunity; Respiratory or GI disease; Limited mobility; Frail or deconditioned; Frequent aspiration of large volumes Recommendations/Plan: Swallowing Evaluation Recommendations Swallowing Evaluation Recommendations Recommendations: PO diet PO Diet Recommendation: Regular; Mildly thick liquids (Level 2, nectar thick) Liquid Administration via: Cup; Straw Medication Administration: Crushed with puree Supervision: Patient able to self-feed Swallowing strategies  : Small bites/sips; Slow rate Postural changes: Position pt fully upright for meals Oral care recommendations: Oral care BID (2x/day) Treatment Plan Treatment Plan Treatment recommendations: Therapy as outlined in treatment plan below Follow-up recommendations: Other (comment) (tba) Functional status assessment: Patient has had a recent decline in their functional status and demonstrates the ability to make significant improvements in function in a reasonable and predictable amount of time. Treatment frequency: Min 2x/week Treatment duration: 2 weeks Interventions: Patient/family education; Oropharyngeal exercises Recommendations Recommendations for follow up therapy are one component of a multi-disciplinary discharge planning process, led by the  Recent Labs  Lab 12/17/22 1428 12/17/22 1627 12/17/22 2102 12/18/22 0318 12/19/22 0335 12/19/22 0738  12/20/22 0617 12/21/22 0411  CRP  --   --   --   --   --  13.5* 5.2* 4.6*  PROCALCITON  --   --  0.80  --  1.01  --  0.95 0.94  LATICACIDVEN  --   --  1.9 1.8  --   --   --   --   BNP  --  421.2*  --   --  301.2*  --  341.6* 375.6*  MG  --   --   --  1.7 2.1  --  1.9 1.9  CALCIUM 9.5  --   --  8.9 9.4  --  10.0 9.4    --------------------------------------------------------------------------------------------------------------- Lab Results  Component Value Date   CHOL 58 10/26/2022   HDL 15 (L) 10/26/2022   LDLCALC 29 10/26/2022   TRIG 68 10/26/2022   CHOLHDL 3.9 10/26/2022    Lab Results  Component Value Date   HGBA1C 5.0 05/29/2019    Radiology Reports DG Swallowing Func-Speech Pathology  Result Date: 12/19/2022 Table formatting from the original result was not included. Modified Barium Swallow Study Patient Details Name: Edward Campos MRN: [id number] Date of Birth: 1932-11-30 Today's Date: 12/19/2022 HPI/PMH: HPI: Edward Campos is a 89 y.o. male who presented the ED with a chief complaint of altered mental status. CXR 10/7: "diffuse bilateral interstitial pulmonary opacity, consistent with edema or atypical/viral infection."  Head CT 10/7 with no acute findings. Pt with medical history significant of AAA, anxiety, hyperlipidemia, peripheral vascular disease, small cell lung carcinoma, atrial flutter and more. Clinical Impression: Clinical Impression: Edward Campos presents with an oropharyngeal dysphagia with trace aspiration (both silent and with delayed cough response) of thin liquids.  There is reduced mobility of the larynx and reduced pharyngeal stripping/base of tongue contraction. This generalized weakness results in diffuse residue of materials after the swallow, spillage of liquids into larynx before and after the swallow, and trace aspiration of thinner liquids.  Cough was weak and not consistently effective in clearing aspiration or penetrant. Efforts to try postural adjustments  and supraglottic swallow technique were not effective.  Recommend thickening liquids to nectar for now. Pt would benefit from palliative care consult given recurring pna and poor potential for improvement in swallow function. D/W Dr. Thedore Mins. Will follow. Factors that may increase risk of adverse event in presence of aspiration Rubye Oaks & Clearance Coots 2021): Factors that may increase risk of adverse event in presence of aspiration Rubye Oaks & Clearance Coots 2021): Poor general health and/or compromised immunity; Respiratory or GI disease; Limited mobility; Frail or deconditioned; Frequent aspiration of large volumes Recommendations/Plan: Swallowing Evaluation Recommendations Swallowing Evaluation Recommendations Recommendations: PO diet PO Diet Recommendation: Regular; Mildly thick liquids (Level 2, nectar thick) Liquid Administration via: Cup; Straw Medication Administration: Crushed with puree Supervision: Patient able to self-feed Swallowing strategies  : Small bites/sips; Slow rate Postural changes: Position pt fully upright for meals Oral care recommendations: Oral care BID (2x/day) Treatment Plan Treatment Plan Treatment recommendations: Therapy as outlined in treatment plan below Follow-up recommendations: Other (comment) (tba) Functional status assessment: Patient has had a recent decline in their functional status and demonstrates the ability to make significant improvements in function in a reasonable and predictable amount of time. Treatment frequency: Min 2x/week Treatment duration: 2 weeks Interventions: Patient/family education; Oropharyngeal exercises Recommendations Recommendations for follow up therapy are one component of a multi-disciplinary discharge planning process, led by the  PROGRESS NOTE                                                                                                                                                                                                             Patient Demographics:    Edward Campos, is a 89 y.o. male, DOB - 07/22/1932, YQM:[id number]  Outpatient Primary MD for the patient is Patient, No Pcp Per    LOS - 4  Admit date - 12/17/2022    Chief Complaint  Patient presents with   Altered Mental Status       Brief Narrative (HPI from H&P)   89 y.o. male with medical history significant of AAA, anxiety, hyperlipidemia, peripheral vascular disease, small cell lung carcinoma, atrial flutter and more presented to the ED with a chief complaint of altered mental status.  He is from a nursing facility and staff there reported onset of shortness of breath over the last few days.    Subjective:   Patient in bed, appears comfortable, denies any headache, no fever, no chest pain or pressure, improved cough and shortness of breath , no abdominal pain. No focal weakness.   Assessment  & Plan :   Acute metabolic encephalopathy due to sepsis from community-acquired pneumonia/aspiration pneumonia with acute hypoxic respiratory failure in the presence of left small cell cancer.  Apparently at baseline patient has normal mentation.  Head CT unremarkable, no focal deficits, continue supportive care, minimize narcotics and benzodiazepines.  Now close to baseline.    Sepsis from community-acquired pneumonia/aspiration pneumonia with acute hypoxic respiratory failure in the presence of left small cell cancer.     Pneumonia could be due to microaspiration versus postobstructive pneumonia from underlying small cell cancer, mildly elevated CRP and procalcitonin, with empiric antibiotics improving, therapy following microaspiration confirmed, discussed with daughter, seen by  palliative care this admission, for now continue full code and present line of care, encouraged to sit in chair use I-S and flutter valve for pulmonary toiletry.  Follow cultures, post discharge follow-up with oncology as soon as possible to commence treatment for underlying small cell cancer, according to daughter he had initially refused following up with oncologist few weeks ago, she had palliative care input.  If stable likely discharge on 12/22/2022 with oral antibiotics.  History of small cell lung cancer  Followed by oncology in

## 2022-12-21 NOTE — Progress Notes (Signed)
Daily Progress Note   Patient Name: Edward Campos       Date: 12/21/2022 DOB: [birth year]  Age: 89 y.o. MRN#: [id number] Attending Physician: Leroy Sea, MD Primary Care Physician: Patient, No Pcp Per Admit Date: 12/17/2022  Reason for Consultation/Follow-up: Establishing goals of care  Patient Profile/HPI:  89 y.o. male  with past medical history of stage IV lung cancer with mets to bones -saw Dr. Ellin Saba in July with plans to start chemotherapy however has had multiple hospitalizations since then and has been unable to start, status post excision of infected femoral bypass has required extensive wound care, wounds are now healed, hyperlipidemia, atrial flutter, admitted on 12/17/2022 with pneumonia.  MBS showed silent trace aspiration of thin liquids. SLP consulted with recommendations for nectar thick liquids and regular diet.  Palliative medicine consulted for goals of care   Subjective: Chart reviewed including labs, progress notes, imaging from this and previous encounters.  Patient was sleeping, did not arouse easily.  Spoke to [place], his [employer]. Patient very confused yesterday. Hallucinating.  Discussed code status- Edward Campos agrees to DNR- if patient has cardiac and or respiratory arrest- then do not attempt CPR. Do not intubate. This more clearly aligns with his goal to not be kept alive on a machine.  Edward Campos understands patient is at high risk for recurrence of aspiration pneumonia. We discussed outpatient followup with Palliative at SNF with plan for eventual transition to hospice when he declines or transitions to LTC.    Review of Systems  Unable to perform ROS: Mental status change     Physical Exam Vitals and nursing note reviewed.  Constitutional:       Appearance: He is ill-appearing.  Skin:    General: Skin is warm and dry.     Coloration: Skin is pale.            Vital Signs: BP (!) 150/83 (BP Location: Right Arm)   Pulse 76   Temp 98.3 F (36.8 C) (Oral)   Resp (!) 21   Ht 5\' 8"  (1.727 m)   Wt 72.6 kg   SpO2 96%   BMI 24.33 kg/m  SpO2: SpO2: 96 % O2 Device: O2 Device: Nasal Cannula O2 Flow Rate: O2 Flow Rate (L/min): 2 L/min  Intake/output summary:  Intake/Output Summary (Last 24 hours) at 12/21/2022 1223  Last data filed at 12/20/2022 2244 Gross per 24 hour  Intake 260 ml  Output --  Net 260 ml   LBM: Last BM Date : 12/20/22 Baseline Weight: Weight: 72.6 kg Most recent weight: Weight: 72.6 kg       Palliative Assessment/Data: PPS: 30%      Patient Active Problem List   Diagnosis Date Noted   Elevated troponin 12/18/2022   Acute respiratory failure with hypoxia (HCC) 12/18/2022   Acute metabolic encephalopathy 12/18/2022   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   MRSA bacteremia 10/27/2022   Sepsis due to methicillin resistant Staphylococcus aureus (MRSA) (HCC) 10/27/2022   Sepsis (HCC) 10/24/2022   Hypokalemia 10/24/2022   Transaminitis 10/24/2022   Hyponatremia 10/24/2022   AAA (abdominal aortic aneurysm) (HCC) 10/24/2022   PAD (peripheral artery disease) (HCC) 10/24/2022   Infection 10/22/2022   Shock (HCC) 09/25/2022   Pneumonia 09/06/2022   Small cell lung cancer, left lower lobe (HCC) 08/30/2022   Mass of left lung 08/21/2022   Infection of vascular bypass graft (HCC) 08/16/2022   Typical atrial flutter (HCC)    Non-healing left groin open wound, initial encounter 07/06/2019   Status post insertion of iliac artery stent 06/08/2019   Peripheral vascular disease (HCC) 06/08/2019    Palliative Care Assessment & Plan    Assessment/Recommendations/Plan  Code status changed to DNR Appreciate TOC coordinating outpatient Palliative to follow at SNF Continue current diet with acceptance of  aspiration risk- plan for transition to Hospice at SNF if declines or when transitions to LTC   Code Status: DNR  Prognosis:  < 6 months  Discharge Planning: Skilled Nursing Facility for rehab with Palliative care service follow-up  Care plan was discussed with patient's daughter- Edward Campos  Thank you for allowing the Palliative Medicine Team to assist in the care of this patient.  Total time:  55 minutes  Time includes:   Preparing to see the patient (e.g., review of tests) Obtaining and/or reviewing separately obtained history Performing a medically necessary appropriate examination and/or evaluation Counseling and educating the patient/family/caregiver Ordering medications, tests, or procedures Referring and communicating with other health care professionals (when not reported separately) Documenting clinical information in the electronic or other health record Independently interpreting results (not reported separately) and communicating results to the patient/family/caregiver Care coordination (not reported separately) Clinical documentation  Ocie Bob, AGNP-C Palliative Medicine   Please contact Palliative Medicine Team phone at [phone] for questions and concerns.

## 2022-12-21 NOTE — Progress Notes (Addendum)
Speech Language Pathology Treatment: Dysphagia  Patient Details Name: Edward Campos MRN: [id number] DOB: 09/05/1932 Today's Date: 12/21/2022 Time: 6213-0865 SLP Time Calculation (min) (ACUTE ONLY): 16 min  Assessment / Plan / Recommendation Clinical Impression  Pt seen for dysphagia treatment. Discussed need for thick liquids presently while he is healing current hospitalization. He states he doesn't mind the thick liquids and consumed without s/s aspiration. Discussed likelihood of returning to thin liquids possibly after discharge when he may be stronger if he prefers with risk of aspiration. Had Palliative meeting that did not specifically detail consistency of po's other than he would not want a feeding tube. He masticated solid texture with slight delays and "little stuck in my teeth." Recommend pt continue regular texture, nectar thick liquids, crush meds. Continue ST and ongoing discussions re: aspiration risks and recommendations. Pt may be returning to SNF and SLP can continue conversations re: thick liquids.    HPI HPI: Edward Campos is a 89 y.o. male who presented the ED with a chief complaint of altered mental status. CXR 10/7: "diffuse bilateral interstitial pulmonary opacity, consistent with edema or atypical/viral infection."  Head CT 10/7 with no acute findings. Pt with medical history significant of AAA, anxiety, hyperlipidemia, peripheral vascular disease, small cell lung carcinoma, atrial flutter and more.      SLP Plan  Continue with current plan of care      Recommendations for follow up therapy are one component of a multi-disciplinary discharge planning process, led by the attending physician.  Recommendations may be updated based on patient status, additional functional criteria and insurance authorization.    Recommendations  Diet recommendations: Regular;Nectar-thick liquid Liquids provided via: Straw;Cup Medication Administration: Crushed with puree Supervision:  Patient able to self feed Compensations: Slow rate;Small sips/bites Postural Changes and/or Swallow Maneuvers: Seated upright 90 degrees                  Oral care BID   Intermittent Supervision/Assistance Dysphagia, oropharyngeal phase (R13.12)     Continue with current plan of care     Royce Macadamia  12/21/2022, 9:32 AM

## 2022-12-22 ENCOUNTER — Inpatient Hospital Stay (HOSPITAL_COMMUNITY): Payer: Medicare Other

## 2022-12-22 DIAGNOSIS — G9341 Metabolic encephalopathy: Secondary | ICD-10-CM | POA: Diagnosis not present

## 2022-12-22 DIAGNOSIS — Z515 Encounter for palliative care: Secondary | ICD-10-CM | POA: Diagnosis not present

## 2022-12-22 DIAGNOSIS — J690 Pneumonitis due to inhalation of food and vomit: Secondary | ICD-10-CM

## 2022-12-22 LAB — BRAIN NATRIURETIC PEPTIDE: B Natriuretic Peptide: 316.1 pg/mL — ABNORMAL HIGH (ref 0.0–100.0)

## 2022-12-22 LAB — BASIC METABOLIC PANEL
Anion gap: 8 (ref 5–15)
BUN: 22 mg/dL (ref 8–23)
CO2: 27 mmol/L (ref 22–32)
Calcium: 9.1 mg/dL (ref 8.9–10.3)
Chloride: 106 mmol/L (ref 98–111)
Creatinine, Ser: 0.98 mg/dL (ref 0.61–1.24)
GFR, Estimated: >60 mL/min (ref >60)
Glucose, Bld: 111 mg/dL — ABNORMAL HIGH (ref 70–99)
Potassium: 3.5 mmol/L (ref 3.5–5.1)
Sodium: 141 mmol/L (ref 135–145)

## 2022-12-22 LAB — CBC WITH DIFFERENTIAL/PLATELET
Abs Immature Granulocytes: 0.16 10*3/uL — ABNORMAL HIGH (ref 0.00–0.07)
Basophils Absolute: 0.1 10*3/uL (ref 0.0–0.1)
Basophils Relative: 1 %
Eosinophils Absolute: 0 10*3/uL (ref 0.0–0.5)
Eosinophils Relative: 0 %
HCT: 31.1 % — ABNORMAL LOW (ref 39.0–52.0)
Hemoglobin: 10.1 g/dL — ABNORMAL LOW (ref 13.0–17.0)
Immature Granulocytes: 2 %
Lymphocytes Relative: 8 %
Lymphs Abs: 0.6 10*3/uL — ABNORMAL LOW (ref 0.7–4.0)
MCH: 28.4 pg (ref 26.0–34.0)
MCHC: 32.5 g/dL (ref 30.0–36.0)
MCV: 87.4 fL (ref 80.0–100.0)
Monocytes Absolute: 0.7 10*3/uL (ref 0.1–1.0)
Monocytes Relative: 9 %
Neutro Abs: 5.8 10*3/uL (ref 1.7–7.7)
Neutrophils Relative %: 80 %
Platelets: 262 10*3/uL (ref 150–400)
RBC: 3.56 MIL/uL — ABNORMAL LOW (ref 4.22–5.81)
RDW: 16.8 % — ABNORMAL HIGH (ref 11.5–15.5)
WBC: 7.4 10*3/uL (ref 4.0–10.5)
nRBC: 0 % (ref 0.0–0.2)

## 2022-12-22 LAB — CULTURE, BLOOD (ROUTINE X 2)
Culture: NO GROWTH
Culture: NO GROWTH

## 2022-12-22 LAB — PROCALCITONIN: Procalcitonin: 1 ng/mL

## 2022-12-22 LAB — MAGNESIUM: Magnesium: 1.9 mg/dL (ref 1.7–2.4)

## 2022-12-22 LAB — C-REACTIVE PROTEIN: CRP: 7.9 mg/dL — ABNORMAL HIGH (ref <1.0)

## 2022-12-22 MED ORDER — AZITHROMYCIN 500 MG PO TABS
500.0000 mg | ORAL_TABLET | Freq: Every day | ORAL | Status: AC
  Administered 2022-12-22 – 2022-12-23 (×2): 500 mg via ORAL
  Filled 2022-12-22 (×2): qty 1

## 2022-12-22 MED ORDER — POTASSIUM CHLORIDE CRYS ER 20 MEQ PO TBCR
40.0000 meq | EXTENDED_RELEASE_TABLET | Freq: Once | ORAL | Status: AC
  Administered 2022-12-22: 40 meq via ORAL
  Filled 2022-12-22: qty 2

## 2022-12-22 NOTE — Progress Notes (Signed)
167             biphasic         +------------------+--------+--------+--------+--------+ Prox anastomosis  172             biphasic         +------------------+--------+--------+--------+--------+ Proximal graft    117                              +------------------+--------+--------+--------+--------+ Mid graft         117                              +------------------+--------+--------+--------+--------+ Distal graft      223                              +------------------+--------+--------+--------+--------+ Distal anastomosis185                              +------------------+--------+--------+--------+--------+ Outflow           88                      Rt cfa   +------------------+--------+--------+--------+--------+    +-----------+--------+-----+--------+--------+----------------+ RIGHT      PSV cm/sRatioStenosisWaveformComments         +-----------+--------+-----+--------+--------+----------------+ DFA        47                                            +-----------+--------+-----+--------+--------+----------------+ SFA Prox   0            occluded                         +-----------+--------+-----+--------+--------+----------------+ SFA Distal 0            occluded                         +-----------+--------+-----+--------+--------+----------------+ POP Prox   0            occluded                         +-----------+--------+-----+--------+--------+----------------+ PTA Distal                              appears occluded +-----------+--------+-----+--------+--------+----------------+ PERO Distal                             appears occluded +-----------+--------+-----+--------+--------+----------------+  Summary: Right: Patent femoral-femoral bypass graft without evidence of significant stenosis  See table(s) above for measurements and observations. Electronically signed by Carolynn Sayers on 12/21/2022 at 5:22:00 PM.    Final    VAS [place] ABI WITH/WO TBI  Result Date: 12/21/2022  LOWER EXTREMITY DOPPLER STUDY Patient Name:  Edward Campos  Date of Exam:   12/21/2022 Medical Rec #: [id number]       Accession #:    [id number] Date of Birth: Jan 31, 1933        Patient Gender: M Patient Age:   89 years Exam Location:  the original result was not included. Modified Barium Swallow Study Patient Details Name: Edward Campos MRN: [id number] Date of Birth: 1932/11/02 Today's Date: 12/19/2022 HPI/PMH: HPI: Edward Campos is a 89 y.o. male who presented the ED with a chief complaint of altered mental status. CXR 10/7: "diffuse bilateral interstitial pulmonary opacity, consistent with edema or atypical/viral infection."  Head CT 10/7 with no acute findings. Pt with medical history significant of AAA, anxiety, hyperlipidemia, peripheral vascular disease, small cell lung carcinoma, atrial flutter and more. Clinical Impression: Clinical Impression: [name] presents with an oropharyngeal dysphagia with trace aspiration (both silent and with delayed cough response) of thin liquids.  There is reduced mobility of the larynx and reduced pharyngeal stripping/base of tongue contraction. This generalized weakness results in diffuse residue of materials after the swallow, spillage of liquids into larynx before and after the swallow, and trace aspiration of thinner liquids.  Cough was weak and not consistently effective in clearing aspiration or penetrant. Efforts to try postural adjustments and supraglottic swallow technique were not effective.  Recommend thickening liquids to nectar for  now. Pt would benefit from palliative care consult given recurring pna and poor potential for improvement in swallow function. D/W Dr. Thedore Mins. Will follow. Factors that may increase risk of adverse event in presence of aspiration Rubye Oaks & Clearance Coots 2021): Factors that may increase risk of adverse event in presence of aspiration Rubye Oaks & Clearance Coots 2021): Poor general health and/or compromised immunity; Respiratory or GI disease; Limited mobility; Frail or deconditioned; Frequent aspiration of large volumes Recommendations/Plan: Swallowing Evaluation Recommendations Swallowing Evaluation Recommendations Recommendations: PO diet PO Diet Recommendation: Regular; Mildly thick liquids (Level 2, nectar thick) Liquid Administration via: Cup; Straw Medication Administration: Crushed with puree Supervision: Patient able to self-feed Swallowing strategies  : Small bites/sips; Slow rate Postural changes: Position pt fully upright for meals Oral care recommendations: Oral care BID (2x/day) Treatment Plan Treatment Plan Treatment recommendations: Therapy as outlined in treatment plan below Follow-up recommendations: Other (comment) (tba) Functional status assessment: Patient has had a recent decline in their functional status and demonstrates the ability to make significant improvements in function in a reasonable and predictable amount of time. Treatment frequency: Min 2x/week Treatment duration: 2 weeks Interventions: Patient/family education; Oropharyngeal exercises Recommendations Recommendations for follow up therapy are one component of a multi-disciplinary discharge planning process, led by the attending physician.  Recommendations may be updated based on patient status, additional functional criteria and insurance authorization. Assessment: Orofacial Exam: Orofacial Exam Oral Cavity: Oral Hygiene: WFL Oral Cavity - Dentition: Dentures, top Orofacial Anatomy: WFL Oral Motor/Sensory Function: WFL Anatomy: Anatomy: Suspected  cervical osteophytes Boluses Administered: Boluses Administered Boluses Administered: Thin liquids (Level 0); Mildly thick liquids (Level 2, nectar thick); Moderately thick liquids (Level 3, honey thick); Puree; Solid  Oral Impairment Domain: Oral Impairment Domain Lip Closure: No labial escape Tongue control during bolus hold: Cohesive bolus between tongue to palatal seal Bolus preparation/mastication: Slow prolonged chewing/mashing with complete recollection Bolus transport/lingual motion: Repetitive/disorganized tongue motion Oral residue: Trace residue lining oral structures Location of oral residue : Tongue Initiation of pharyngeal swallow : Pyriform sinuses  Pharyngeal Impairment Domain: Pharyngeal Impairment Domain Soft palate elevation: No bolus between soft palate (SP)/pharyngeal wall (PW) Laryngeal elevation: Partial superior movement of thyroid cartilage/partial approximation of arytenoids to epiglottic petiole Anterior hyoid excursion: Complete anterior movement Epiglottic movement: Complete inversion Laryngeal vestibule closure: Incomplete, narrow column air/contrast in laryngeal vestibule Pharyngeal stripping wave : Present - diminished Pharyngeal contraction (A/P view only): N/A Pharyngoesophageal segment opening: Partial  PROGRESS NOTE                                                                                                                                                                                                             Patient Demographics:    Edward Campos, is a 89 y.o. male, DOB - 04-Aug-1932, WJX:[id number]  Outpatient Primary MD for the patient is Patient, No Pcp Per    LOS - 5  Admit date - 12/17/2022    Chief Complaint  Patient presents with   Altered Mental Status       Brief Narrative (HPI from H&P)   89 y.o. male with medical history significant of AAA, anxiety, hyperlipidemia, peripheral vascular disease, small cell lung carcinoma, atrial flutter and more presented to the ED with a chief complaint of altered mental status.  He is from a nursing facility and staff there reported onset of shortness of breath over the last few days.    Subjective:   Patient in bed, appears comfortable, denies any headache, no fever, no chest pain or pressure, no shortness of breath , no abdominal pain. No new focal weakness.   Assessment  & Plan :   Acute metabolic encephalopathy due to sepsis from community-acquired pneumonia/aspiration pneumonia with acute hypoxic respiratory failure in the presence of left small cell cancer.  Apparently at baseline patient has normal mentation.  Head CT unremarkable, no focal deficits, continue supportive care, minimize narcotics and benzodiazepines.  Overall quite frail, underlying non-small cell lung cancer, now encephalopathy with hospital-acquired delirium, prognosis does not look good, gradual but definite decline on a daily basis.  Palliative care on board, made DNR, if decline continues then may have to transition him to comfort measures, discussed with patient's daughter in detail on 12/22/2022.    Sepsis from community-acquired pneumonia/aspiration pneumonia with acute hypoxic respiratory  failure in the presence of left small cell cancer.     Pneumonia could be due to microaspiration versus postobstructive pneumonia from underlying small cell cancer, mildly elevated CRP and procalcitonin, with empiric antibiotics improving, therapy following microaspiration confirmed, discussed with daughter, seen by palliative care this admission, for now continue full code and present line of care, encouraged to sit in chair use I-S and flutter valve for pulmonary toiletry.  Follow cultures, post discharge follow-up with oncology as soon as possible to  PROGRESS NOTE                                                                                                                                                                                                             Patient Demographics:    Edward Campos, is a 89 y.o. male, DOB - 04-Aug-1932, WJX:[id number]  Outpatient Primary MD for the patient is Patient, No Pcp Per    LOS - 5  Admit date - 12/17/2022    Chief Complaint  Patient presents with   Altered Mental Status       Brief Narrative (HPI from H&P)   89 y.o. male with medical history significant of AAA, anxiety, hyperlipidemia, peripheral vascular disease, small cell lung carcinoma, atrial flutter and more presented to the ED with a chief complaint of altered mental status.  He is from a nursing facility and staff there reported onset of shortness of breath over the last few days.    Subjective:   Patient in bed, appears comfortable, denies any headache, no fever, no chest pain or pressure, no shortness of breath , no abdominal pain. No new focal weakness.   Assessment  & Plan :   Acute metabolic encephalopathy due to sepsis from community-acquired pneumonia/aspiration pneumonia with acute hypoxic respiratory failure in the presence of left small cell cancer.  Apparently at baseline patient has normal mentation.  Head CT unremarkable, no focal deficits, continue supportive care, minimize narcotics and benzodiazepines.  Overall quite frail, underlying non-small cell lung cancer, now encephalopathy with hospital-acquired delirium, prognosis does not look good, gradual but definite decline on a daily basis.  Palliative care on board, made DNR, if decline continues then may have to transition him to comfort measures, discussed with patient's daughter in detail on 12/22/2022.    Sepsis from community-acquired pneumonia/aspiration pneumonia with acute hypoxic respiratory  failure in the presence of left small cell cancer.     Pneumonia could be due to microaspiration versus postobstructive pneumonia from underlying small cell cancer, mildly elevated CRP and procalcitonin, with empiric antibiotics improving, therapy following microaspiration confirmed, discussed with daughter, seen by palliative care this admission, for now continue full code and present line of care, encouraged to sit in chair use I-S and flutter valve for pulmonary toiletry.  Follow cultures, post discharge follow-up with oncology as soon as possible to  the original result was not included. Modified Barium Swallow Study Patient Details Name: Edward Campos MRN: [id number] Date of Birth: 1932/11/02 Today's Date: 12/19/2022 HPI/PMH: HPI: Edward Campos is a 89 y.o. male who presented the ED with a chief complaint of altered mental status. CXR 10/7: "diffuse bilateral interstitial pulmonary opacity, consistent with edema or atypical/viral infection."  Head CT 10/7 with no acute findings. Pt with medical history significant of AAA, anxiety, hyperlipidemia, peripheral vascular disease, small cell lung carcinoma, atrial flutter and more. Clinical Impression: Clinical Impression: [name] presents with an oropharyngeal dysphagia with trace aspiration (both silent and with delayed cough response) of thin liquids.  There is reduced mobility of the larynx and reduced pharyngeal stripping/base of tongue contraction. This generalized weakness results in diffuse residue of materials after the swallow, spillage of liquids into larynx before and after the swallow, and trace aspiration of thinner liquids.  Cough was weak and not consistently effective in clearing aspiration or penetrant. Efforts to try postural adjustments and supraglottic swallow technique were not effective.  Recommend thickening liquids to nectar for  now. Pt would benefit from palliative care consult given recurring pna and poor potential for improvement in swallow function. D/W Dr. Thedore Mins. Will follow. Factors that may increase risk of adverse event in presence of aspiration Rubye Oaks & Clearance Coots 2021): Factors that may increase risk of adverse event in presence of aspiration Rubye Oaks & Clearance Coots 2021): Poor general health and/or compromised immunity; Respiratory or GI disease; Limited mobility; Frail or deconditioned; Frequent aspiration of large volumes Recommendations/Plan: Swallowing Evaluation Recommendations Swallowing Evaluation Recommendations Recommendations: PO diet PO Diet Recommendation: Regular; Mildly thick liquids (Level 2, nectar thick) Liquid Administration via: Cup; Straw Medication Administration: Crushed with puree Supervision: Patient able to self-feed Swallowing strategies  : Small bites/sips; Slow rate Postural changes: Position pt fully upright for meals Oral care recommendations: Oral care BID (2x/day) Treatment Plan Treatment Plan Treatment recommendations: Therapy as outlined in treatment plan below Follow-up recommendations: Other (comment) (tba) Functional status assessment: Patient has had a recent decline in their functional status and demonstrates the ability to make significant improvements in function in a reasonable and predictable amount of time. Treatment frequency: Min 2x/week Treatment duration: 2 weeks Interventions: Patient/family education; Oropharyngeal exercises Recommendations Recommendations for follow up therapy are one component of a multi-disciplinary discharge planning process, led by the attending physician.  Recommendations may be updated based on patient status, additional functional criteria and insurance authorization. Assessment: Orofacial Exam: Orofacial Exam Oral Cavity: Oral Hygiene: WFL Oral Cavity - Dentition: Dentures, top Orofacial Anatomy: WFL Oral Motor/Sensory Function: WFL Anatomy: Anatomy: Suspected  cervical osteophytes Boluses Administered: Boluses Administered Boluses Administered: Thin liquids (Level 0); Mildly thick liquids (Level 2, nectar thick); Moderately thick liquids (Level 3, honey thick); Puree; Solid  Oral Impairment Domain: Oral Impairment Domain Lip Closure: No labial escape Tongue control during bolus hold: Cohesive bolus between tongue to palatal seal Bolus preparation/mastication: Slow prolonged chewing/mashing with complete recollection Bolus transport/lingual motion: Repetitive/disorganized tongue motion Oral residue: Trace residue lining oral structures Location of oral residue : Tongue Initiation of pharyngeal swallow : Pyriform sinuses  Pharyngeal Impairment Domain: Pharyngeal Impairment Domain Soft palate elevation: No bolus between soft palate (SP)/pharyngeal wall (PW) Laryngeal elevation: Partial superior movement of thyroid cartilage/partial approximation of arytenoids to epiglottic petiole Anterior hyoid excursion: Complete anterior movement Epiglottic movement: Complete inversion Laryngeal vestibule closure: Incomplete, narrow column air/contrast in laryngeal vestibule Pharyngeal stripping wave : Present - diminished Pharyngeal contraction (A/P view only): N/A Pharyngoesophageal segment opening: Partial  PROGRESS NOTE                                                                                                                                                                                                             Patient Demographics:    Edward Campos, is a 89 y.o. male, DOB - 04-Aug-1932, WJX:[id number]  Outpatient Primary MD for the patient is Patient, No Pcp Per    LOS - 5  Admit date - 12/17/2022    Chief Complaint  Patient presents with   Altered Mental Status       Brief Narrative (HPI from H&P)   89 y.o. male with medical history significant of AAA, anxiety, hyperlipidemia, peripheral vascular disease, small cell lung carcinoma, atrial flutter and more presented to the ED with a chief complaint of altered mental status.  He is from a nursing facility and staff there reported onset of shortness of breath over the last few days.    Subjective:   Patient in bed, appears comfortable, denies any headache, no fever, no chest pain or pressure, no shortness of breath , no abdominal pain. No new focal weakness.   Assessment  & Plan :   Acute metabolic encephalopathy due to sepsis from community-acquired pneumonia/aspiration pneumonia with acute hypoxic respiratory failure in the presence of left small cell cancer.  Apparently at baseline patient has normal mentation.  Head CT unremarkable, no focal deficits, continue supportive care, minimize narcotics and benzodiazepines.  Overall quite frail, underlying non-small cell lung cancer, now encephalopathy with hospital-acquired delirium, prognosis does not look good, gradual but definite decline on a daily basis.  Palliative care on board, made DNR, if decline continues then may have to transition him to comfort measures, discussed with patient's daughter in detail on 12/22/2022.    Sepsis from community-acquired pneumonia/aspiration pneumonia with acute hypoxic respiratory  failure in the presence of left small cell cancer.     Pneumonia could be due to microaspiration versus postobstructive pneumonia from underlying small cell cancer, mildly elevated CRP and procalcitonin, with empiric antibiotics improving, therapy following microaspiration confirmed, discussed with daughter, seen by palliative care this admission, for now continue full code and present line of care, encouraged to sit in chair use I-S and flutter valve for pulmonary toiletry.  Follow cultures, post discharge follow-up with oncology as soon as possible to  1.9 1.8  --   --   --   --   --   BNP  --  421.2*  --   --  301.2*  --  341.6* 375.6* 316.1*  MG  --   --   --  1.7 2.1  --  1.9 1.9 1.9  CALCIUM 9.5  --   --  8.9 9.4  --  10.0 9.4 9.1      Recent Labs  Lab 12/17/22 1627 12/17/22 2102 12/18/22 0318 12/19/22 0335 12/19/22 0738 12/20/22 0617 12/21/22 0411 12/22/22 0310  CRP  --   --   --   --  13.5* 5.2* 4.6* 7.9*  PROCALCITON  --  0.80  --  1.01  --  0.95 0.94 1.00  LATICACIDVEN  --  1.9 1.8  --   --   --   --   --   BNP 421.2*  --   --  301.2*  --  341.6* 375.6* 316.1*  MG  --   --  1.7 2.1  --  1.9 1.9 1.9  CALCIUM  --   --  8.9 9.4  --  10.0 9.4 9.1    --------------------------------------------------------------------------------------------------------------- Lab Results  Component Value Date   CHOL 58 10/26/2022   HDL 15 (L) 10/26/2022   LDLCALC 29 10/26/2022   TRIG 68 10/26/2022   CHOLHDL 3.9 10/26/2022    Lab Results  Component Value Date   HGBA1C 5.0 05/29/2019    Radiology Reports DG Chest Port 1 View  Result Date: 12/22/2022 CLINICAL DATA:  Shortness of breath EXAM: PORTABLE CHEST - 1 VIEW COMPARISON:  12/19/2022 FINDINGS: Hazy poorly marginated airspace opacities throughout both lungs with a predominantly perihilar distribution, left greater than right. Left hemidiaphragm is obscured. Heart size and mediastinal contours are within normal limits. Aortic Atherosclerosis (ICD10-170.0). No definite effusion. Visualized bones unremarkable. IMPRESSION: Bilateral perihilar airspace disease, left greater than right. Electronically Signed   By: Corlis Leak M.D.   On: 12/22/2022 08:47   VAS [place] FEMORAL-FEMORAL BYPASS GRAFT  Result Date: 12/21/2022 LOWER EXTREMITY ARTERIAL DUPLEX STUDY Patient Name:  Edward Campos  Date of Exam:   12/21/2022 Medical Rec #: [id number]       Accession #:    [id number] Date  of Birth: 1932-05-23        Patient Gender: M Patient Age:   89 years Exam Location:  [facility] Procedure:      VAS [place] FEMORAL-FEMORAL BYPASS GRAFT Referring Phys: Heath Lark --------------------------------------------------------------------------------  Indications: Peripheral artery disease.  Vascular Interventions: 10/25/22 - Excision bypass graft femoral-femoral artery                         and redo femoral-femoral graft using cryo-saphenous                         vein. Current ABI:            Rt. Not obtainable. Left 0.57 Performing Technologist: Garfield Sink Sturdivant RDMS, RVT  Examination Guidelines: A complete evaluation includes B-mode imaging, spectral Doppler, color Doppler, and power Doppler as needed of all accessible portions of each vessel. Bilateral testing is considered an integral part of a complete examination. Limited examinations for reoccurring indications may be performed as noted.  Fem Fem Graft: +------------------+--------+--------+--------+--------+                   PSV cm/sStenosisWaveformComments +------------------+--------+--------+--------+--------+ Inflow  1.9 1.8  --   --   --   --   --   BNP  --  421.2*  --   --  301.2*  --  341.6* 375.6* 316.1*  MG  --   --   --  1.7 2.1  --  1.9 1.9 1.9  CALCIUM 9.5  --   --  8.9 9.4  --  10.0 9.4 9.1      Recent Labs  Lab 12/17/22 1627 12/17/22 2102 12/18/22 0318 12/19/22 0335 12/19/22 0738 12/20/22 0617 12/21/22 0411 12/22/22 0310  CRP  --   --   --   --  13.5* 5.2* 4.6* 7.9*  PROCALCITON  --  0.80  --  1.01  --  0.95 0.94 1.00  LATICACIDVEN  --  1.9 1.8  --   --   --   --   --   BNP 421.2*  --   --  301.2*  --  341.6* 375.6* 316.1*  MG  --   --  1.7 2.1  --  1.9 1.9 1.9  CALCIUM  --   --  8.9 9.4  --  10.0 9.4 9.1    --------------------------------------------------------------------------------------------------------------- Lab Results  Component Value Date   CHOL 58 10/26/2022   HDL 15 (L) 10/26/2022   LDLCALC 29 10/26/2022   TRIG 68 10/26/2022   CHOLHDL 3.9 10/26/2022    Lab Results  Component Value Date   HGBA1C 5.0 05/29/2019    Radiology Reports DG Chest Port 1 View  Result Date: 12/22/2022 CLINICAL DATA:  Shortness of breath EXAM: PORTABLE CHEST - 1 VIEW COMPARISON:  12/19/2022 FINDINGS: Hazy poorly marginated airspace opacities throughout both lungs with a predominantly perihilar distribution, left greater than right. Left hemidiaphragm is obscured. Heart size and mediastinal contours are within normal limits. Aortic Atherosclerosis (ICD10-170.0). No definite effusion. Visualized bones unremarkable. IMPRESSION: Bilateral perihilar airspace disease, left greater than right. Electronically Signed   By: Corlis Leak M.D.   On: 12/22/2022 08:47   VAS [place] FEMORAL-FEMORAL BYPASS GRAFT  Result Date: 12/21/2022 LOWER EXTREMITY ARTERIAL DUPLEX STUDY Patient Name:  Edward Campos  Date of Exam:   12/21/2022 Medical Rec #: [id number]       Accession #:    [id number] Date  of Birth: 1932-05-23        Patient Gender: M Patient Age:   89 years Exam Location:  [facility] Procedure:      VAS [place] FEMORAL-FEMORAL BYPASS GRAFT Referring Phys: Heath Lark --------------------------------------------------------------------------------  Indications: Peripheral artery disease.  Vascular Interventions: 10/25/22 - Excision bypass graft femoral-femoral artery                         and redo femoral-femoral graft using cryo-saphenous                         vein. Current ABI:            Rt. Not obtainable. Left 0.57 Performing Technologist: Garfield Sink Sturdivant RDMS, RVT  Examination Guidelines: A complete evaluation includes B-mode imaging, spectral Doppler, color Doppler, and power Doppler as needed of all accessible portions of each vessel. Bilateral testing is considered an integral part of a complete examination. Limited examinations for reoccurring indications may be performed as noted.  Fem Fem Graft: +------------------+--------+--------+--------+--------+                   PSV cm/sStenosisWaveformComments +------------------+--------+--------+--------+--------+ Inflow  1.9 1.8  --   --   --   --   --   BNP  --  421.2*  --   --  301.2*  --  341.6* 375.6* 316.1*  MG  --   --   --  1.7 2.1  --  1.9 1.9 1.9  CALCIUM 9.5  --   --  8.9 9.4  --  10.0 9.4 9.1      Recent Labs  Lab 12/17/22 1627 12/17/22 2102 12/18/22 0318 12/19/22 0335 12/19/22 0738 12/20/22 0617 12/21/22 0411 12/22/22 0310  CRP  --   --   --   --  13.5* 5.2* 4.6* 7.9*  PROCALCITON  --  0.80  --  1.01  --  0.95 0.94 1.00  LATICACIDVEN  --  1.9 1.8  --   --   --   --   --   BNP 421.2*  --   --  301.2*  --  341.6* 375.6* 316.1*  MG  --   --  1.7 2.1  --  1.9 1.9 1.9  CALCIUM  --   --  8.9 9.4  --  10.0 9.4 9.1    --------------------------------------------------------------------------------------------------------------- Lab Results  Component Value Date   CHOL 58 10/26/2022   HDL 15 (L) 10/26/2022   LDLCALC 29 10/26/2022   TRIG 68 10/26/2022   CHOLHDL 3.9 10/26/2022    Lab Results  Component Value Date   HGBA1C 5.0 05/29/2019    Radiology Reports DG Chest Port 1 View  Result Date: 12/22/2022 CLINICAL DATA:  Shortness of breath EXAM: PORTABLE CHEST - 1 VIEW COMPARISON:  12/19/2022 FINDINGS: Hazy poorly marginated airspace opacities throughout both lungs with a predominantly perihilar distribution, left greater than right. Left hemidiaphragm is obscured. Heart size and mediastinal contours are within normal limits. Aortic Atherosclerosis (ICD10-170.0). No definite effusion. Visualized bones unremarkable. IMPRESSION: Bilateral perihilar airspace disease, left greater than right. Electronically Signed   By: Corlis Leak M.D.   On: 12/22/2022 08:47   VAS [place] FEMORAL-FEMORAL BYPASS GRAFT  Result Date: 12/21/2022 LOWER EXTREMITY ARTERIAL DUPLEX STUDY Patient Name:  Edward Campos  Date of Exam:   12/21/2022 Medical Rec #: [id number]       Accession #:    [id number] Date  of Birth: 1932-05-23        Patient Gender: M Patient Age:   89 years Exam Location:  [facility] Procedure:      VAS [place] FEMORAL-FEMORAL BYPASS GRAFT Referring Phys: Heath Lark --------------------------------------------------------------------------------  Indications: Peripheral artery disease.  Vascular Interventions: 10/25/22 - Excision bypass graft femoral-femoral artery                         and redo femoral-femoral graft using cryo-saphenous                         vein. Current ABI:            Rt. Not obtainable. Left 0.57 Performing Technologist: Garfield Sink Sturdivant RDMS, RVT  Examination Guidelines: A complete evaluation includes B-mode imaging, spectral Doppler, color Doppler, and power Doppler as needed of all accessible portions of each vessel. Bilateral testing is considered an integral part of a complete examination. Limited examinations for reoccurring indications may be performed as noted.  Fem Fem Graft: +------------------+--------+--------+--------+--------+                   PSV cm/sStenosisWaveformComments +------------------+--------+--------+--------+--------+ Inflow

## 2022-12-23 DIAGNOSIS — J9601 Acute respiratory failure with hypoxia: Secondary | ICD-10-CM | POA: Diagnosis not present

## 2022-12-23 DIAGNOSIS — I739 Peripheral vascular disease, unspecified: Secondary | ICD-10-CM | POA: Diagnosis not present

## 2022-12-23 LAB — CBC WITH DIFFERENTIAL/PLATELET
Abs Immature Granulocytes: 0.18 10*3/uL — ABNORMAL HIGH (ref 0.00–0.07)
Basophils Absolute: 0.1 10*3/uL (ref 0.0–0.1)
Basophils Relative: 1 %
Eosinophils Absolute: 0 10*3/uL (ref 0.0–0.5)
Eosinophils Relative: 0 %
HCT: 30.4 % — ABNORMAL LOW (ref 39.0–52.0)
Hemoglobin: 9.6 g/dL — ABNORMAL LOW (ref 13.0–17.0)
Immature Granulocytes: 2 %
Lymphocytes Relative: 6 %
Lymphs Abs: 0.5 10*3/uL — ABNORMAL LOW (ref 0.7–4.0)
MCH: 27.2 pg (ref 26.0–34.0)
MCHC: 31.6 g/dL (ref 30.0–36.0)
MCV: 86.1 fL (ref 80.0–100.0)
Monocytes Absolute: 0.6 10*3/uL (ref 0.1–1.0)
Monocytes Relative: 7 %
Neutro Abs: 6.6 10*3/uL (ref 1.7–7.7)
Neutrophils Relative %: 84 %
Platelets: 282 10*3/uL (ref 150–400)
RBC: 3.53 MIL/uL — ABNORMAL LOW (ref 4.22–5.81)
RDW: 16.9 % — ABNORMAL HIGH (ref 11.5–15.5)
WBC: 7.9 10*3/uL (ref 4.0–10.5)
nRBC: 0 % (ref 0.0–0.2)

## 2022-12-23 LAB — BASIC METABOLIC PANEL
Anion gap: 5 (ref 5–15)
BUN: 18 mg/dL (ref 8–23)
CO2: 27 mmol/L (ref 22–32)
Calcium: 8.9 mg/dL (ref 8.9–10.3)
Chloride: 106 mmol/L (ref 98–111)
Creatinine, Ser: 0.83 mg/dL (ref 0.61–1.24)
GFR, Estimated: >60 mL/min (ref >60)
Glucose, Bld: 169 mg/dL — ABNORMAL HIGH (ref 70–99)
Potassium: 3.5 mmol/L (ref 3.5–5.1)
Sodium: 138 mmol/L (ref 135–145)

## 2022-12-23 LAB — PROCALCITONIN: Procalcitonin: 0.82 ng/mL

## 2022-12-23 LAB — MAGNESIUM: Magnesium: 1.8 mg/dL (ref 1.7–2.4)

## 2022-12-23 LAB — C-REACTIVE PROTEIN: CRP: 7.2 mg/dL — ABNORMAL HIGH (ref <1.0)

## 2022-12-23 LAB — BRAIN NATRIURETIC PEPTIDE: B Natriuretic Peptide: 394.3 pg/mL — ABNORMAL HIGH (ref 0.0–100.0)

## 2022-12-23 MED ORDER — POTASSIUM CHLORIDE CRYS ER 20 MEQ PO TBCR
40.0000 meq | EXTENDED_RELEASE_TABLET | Freq: Once | ORAL | Status: AC
  Administered 2022-12-23: 40 meq via ORAL
  Filled 2022-12-23: qty 2

## 2022-12-23 NOTE — Progress Notes (Signed)
PTA Distal                              appears occluded +-----------+--------+-----+--------+--------+----------------+ PERO Distal                             appears occluded +-----------+--------+-----+--------+--------+----------------+  Summary: Right: Patent femoral-femoral bypass graft without evidence of significant stenosis  See table(s) above for measurements and observations. Electronically signed by Carolynn Sayers on 12/21/2022 at 5:22:00 PM.    Final    VAS [place] ABI WITH/WO TBI  Result Date: 12/21/2022  LOWER EXTREMITY DOPPLER STUDY Patient Name:  Edward Campos  Date of Exam:   12/21/2022 Medical Rec #: [id number]       Accession #:    [id number] Date of Birth: Jun 18, 1932        Patient Gender: M Patient Age:   89 years Exam Location:  [facility] Procedure:      VAS [place] ABI  WITH/WO TBI Referring Phys: Heath Lark --------------------------------------------------------------------------------  Indications: Peripheral artery disease. History of excision bypass graft              femoral-femoral and redo fem-femo graft using cryo saphenous vein              10/25/22.  Limitations: Today's exam was limited due to involuntary patient movement and .              Not able to obtain pressures on the right side and left PTA. Performing Technologist: Marilynne Halsted RDMS, RVT  Examination Guidelines: A complete evaluation includes at minimum, Doppler waveform signals and systolic blood pressure reading at the level of bilateral brachial, anterior tibial, and posterior tibial arteries, when vessel segments are accessible. Bilateral testing is considered an integral part of a complete examination. Photoelectric Plethysmograph (PPG) waveforms and toe systolic pressure readings are included as required and additional duplex testing as needed. Limited examinations for reoccurring indications may be performed as noted.  ABI Findings: +--------+------------------+-----+---------+--------+ Right   Rt Pressure (mmHg)IndexWaveform Comment  +--------+------------------+-----+---------+--------+ [id number]                    triphasic         +--------+------------------+-----+---------+--------+ +---------+------------------+-----+----------+---------------------------+ Left     Lt Pressure (mmHg)IndexWaveform  Comment                     +---------+------------------+-----+----------+---------------------------+ Brachial 127                    biphasic                              +---------+------------------+-----+----------+---------------------------+ PERO                            monophasicNot able to obtain pressure +---------+------------------+-----+----------+---------------------------+ DP       83                0.57 monophasic                             +---------+------------------+-----+----------+---------------------------+ Great Toe71                0.49 Abnormal                              +---------+------------------+-----+----------+---------------------------+ +-------+-----------+-----------+------------+------------+  PROGRESS NOTE                                                                                                                                                                                                             Patient Demographics:    Edward Campos, is a 89 y.o. male, DOB - Mar 21, 1932, ZOX:[id number]  Outpatient Primary MD for the patient is Patient, No Pcp Per    LOS - 6  Admit date - 12/17/2022    Chief Complaint  Patient presents with   Altered Mental Status       Brief Narrative (HPI from H&P)   89 y.o. male with medical history significant of AAA, anxiety, hyperlipidemia, peripheral vascular disease, small cell lung carcinoma, atrial flutter and more presented to the ED with a chief complaint of altered mental status.  He is from a nursing facility and staff there reported onset of shortness of breath over the last few days.    Subjective:   Patient in bed, appears comfortable, denies any headache, no fever, no chest pain or pressure, no shortness of breath , no abdominal pain. No new focal weakness.   Assessment  & Plan :   Acute metabolic encephalopathy due to sepsis from community-acquired pneumonia/aspiration pneumonia with acute hypoxic respiratory failure in the presence of left small cell cancer.  Apparently at baseline patient has normal mentation.  Head CT unremarkable, no focal deficits, continue supportive care, minimize narcotics and benzodiazepines.  Overall quite frail, underlying non-small cell lung cancer, now encephalopathy with hospital-acquired delirium, prognosis does not look good, gradual but definite decline on a daily basis.  Palliative care on board, made DNR, if decline continues then may have to transition him to comfort measures, discussed with patient's daughter in detail on 12/22/2022.    Sepsis from community-acquired pneumonia/aspiration pneumonia with acute hypoxic respiratory  failure in the presence of left small cell cancer.     Pneumonia could be due to microaspiration versus postobstructive pneumonia from underlying small cell cancer, mildly elevated CRP and procalcitonin, with empiric antibiotics improving, therapy following microaspiration confirmed, discussed with daughter, seen by palliative care this admission, for now continue full code and present line of care, encouraged to sit in chair use I-S and flutter valve for pulmonary toiletry.  Follow cultures, post discharge follow-up with oncology as soon as possible to  PTA Distal                              appears occluded +-----------+--------+-----+--------+--------+----------------+ PERO Distal                             appears occluded +-----------+--------+-----+--------+--------+----------------+  Summary: Right: Patent femoral-femoral bypass graft without evidence of significant stenosis  See table(s) above for measurements and observations. Electronically signed by Carolynn Sayers on 12/21/2022 at 5:22:00 PM.    Final    VAS [place] ABI WITH/WO TBI  Result Date: 12/21/2022  LOWER EXTREMITY DOPPLER STUDY Patient Name:  Edward Campos  Date of Exam:   12/21/2022 Medical Rec #: [id number]       Accession #:    [id number] Date of Birth: Jun 18, 1932        Patient Gender: M Patient Age:   89 years Exam Location:  [facility] Procedure:      VAS [place] ABI  WITH/WO TBI Referring Phys: Heath Lark --------------------------------------------------------------------------------  Indications: Peripheral artery disease. History of excision bypass graft              femoral-femoral and redo fem-femo graft using cryo saphenous vein              10/25/22.  Limitations: Today's exam was limited due to involuntary patient movement and .              Not able to obtain pressures on the right side and left PTA. Performing Technologist: Marilynne Halsted RDMS, RVT  Examination Guidelines: A complete evaluation includes at minimum, Doppler waveform signals and systolic blood pressure reading at the level of bilateral brachial, anterior tibial, and posterior tibial arteries, when vessel segments are accessible. Bilateral testing is considered an integral part of a complete examination. Photoelectric Plethysmograph (PPG) waveforms and toe systolic pressure readings are included as required and additional duplex testing as needed. Limited examinations for reoccurring indications may be performed as noted.  ABI Findings: +--------+------------------+-----+---------+--------+ Right   Rt Pressure (mmHg)IndexWaveform Comment  +--------+------------------+-----+---------+--------+ [id number]                    triphasic         +--------+------------------+-----+---------+--------+ +---------+------------------+-----+----------+---------------------------+ Left     Lt Pressure (mmHg)IndexWaveform  Comment                     +---------+------------------+-----+----------+---------------------------+ Brachial 127                    biphasic                              +---------+------------------+-----+----------+---------------------------+ PERO                            monophasicNot able to obtain pressure +---------+------------------+-----+----------+---------------------------+ DP       83                0.57 monophasic                             +---------+------------------+-----+----------+---------------------------+ Great Toe71                0.49 Abnormal                              +---------+------------------+-----+----------+---------------------------+ +-------+-----------+-----------+------------+------------+  ABI/TBIToday's ABIToday's TBIPrevious ABIPrevious TBI +-------+-----------+-----------+------------+------------+ Right                        0.82        0.45         +-------+-----------+-----------+------------+------------+ Left   0.57       0.49       0.83        0.69         +-------+-----------+-----------+------------+------------+  Summary: Right: Incomplete study on the right side due to patient constant movement. Left: Resting left ankle-brachial index indicates moderate left lower extremity arterial disease. The left toe-brachial index is abnormal. *See table(s) above for measurements and observations.  Electronically signed by Carolynn Sayers on 12/21/2022 at 5:21:43 PM.    Final    DG Swallowing Func-Speech Pathology  Result Date: 12/19/2022 Table formatting from the original result was not included. Modified Barium Swallow Study Patient Details Name: Edward Campos MRN: [id number] Date of Birth: 26-Jan-1933 Today's Date: 12/19/2022 HPI/PMH: HPI: Jackie Littlejohn is a 89 y.o. male who presented the ED with a chief complaint of altered mental status. CXR 10/7: "diffuse bilateral interstitial pulmonary opacity, consistent with edema or atypical/viral infection."  Head CT 10/7 with no acute findings. Pt with medical history significant of AAA, anxiety, hyperlipidemia, peripheral vascular disease, small cell lung carcinoma, atrial flutter and more. Clinical Impression: Clinical Impression: Mr. Soward presents with an oropharyngeal dysphagia with trace aspiration (both silent and with delayed cough response) of thin liquids.  There is reduced mobility of the larynx and reduced pharyngeal stripping/base of tongue  contraction. This generalized weakness results in diffuse residue of materials after the swallow, spillage of liquids into larynx before and after the swallow, and trace aspiration of thinner liquids.  Cough was weak and not consistently effective in clearing aspiration or penetrant. Efforts to try postural adjustments and supraglottic swallow technique were not effective.  Recommend thickening liquids to nectar for now. Pt would benefit from palliative care consult given recurring pna and poor potential for improvement in swallow function. D/W Dr. Thedore Mins. Will follow. Factors that may increase risk of adverse event in presence of aspiration Rubye Oaks & Clearance Coots 2021): Factors that may increase risk of adverse event in presence of aspiration Rubye Oaks & Clearance Coots 2021): Poor general health and/or compromised immunity; Respiratory or GI disease; Limited mobility; Frail or deconditioned; Frequent aspiration of large volumes Recommendations/Plan: Swallowing Evaluation Recommendations Swallowing Evaluation Recommendations Recommendations: PO diet PO Diet Recommendation: Regular; Mildly thick liquids (Level 2, nectar thick) Liquid Administration via: Cup; Straw Medication Administration: Crushed with puree Supervision: Patient able to self-feed Swallowing strategies  : Small bites/sips; Slow rate Postural changes: Position pt fully upright for meals Oral care recommendations: Oral care BID (2x/day) Treatment Plan Treatment Plan Treatment recommendations: Therapy as outlined in treatment plan below Follow-up recommendations: Other (comment) (tba) Functional status assessment: Patient has had a recent decline in their functional status and demonstrates the ability to make significant improvements in function in a reasonable and predictable amount of time. Treatment frequency: Min 2x/week Treatment duration: 2 weeks Interventions: Patient/family education; Oropharyngeal exercises Recommendations Recommendations for follow up  therapy are one component of a multi-disciplinary discharge planning process, led by the attending physician.  Recommendations may be updated based on patient status, additional functional criteria and insurance authorization. Assessment: Orofacial Exam: Orofacial Exam Oral Cavity: Oral Hygiene: WFL Oral Cavity - Dentition: Dentures, top Orofacial Anatomy: WFL Oral Motor/Sensory Function: WFL Anatomy: Anatomy: Suspected cervical osteophytes Boluses Administered:  ABI/TBIToday's ABIToday's TBIPrevious ABIPrevious TBI +-------+-----------+-----------+------------+------------+ Right                        0.82        0.45         +-------+-----------+-----------+------------+------------+ Left   0.57       0.49       0.83        0.69         +-------+-----------+-----------+------------+------------+  Summary: Right: Incomplete study on the right side due to patient constant movement. Left: Resting left ankle-brachial index indicates moderate left lower extremity arterial disease. The left toe-brachial index is abnormal. *See table(s) above for measurements and observations.  Electronically signed by Carolynn Sayers on 12/21/2022 at 5:21:43 PM.    Final    DG Swallowing Func-Speech Pathology  Result Date: 12/19/2022 Table formatting from the original result was not included. Modified Barium Swallow Study Patient Details Name: Edward Campos MRN: [id number] Date of Birth: 26-Jan-1933 Today's Date: 12/19/2022 HPI/PMH: HPI: Jackie Littlejohn is a 89 y.o. male who presented the ED with a chief complaint of altered mental status. CXR 10/7: "diffuse bilateral interstitial pulmonary opacity, consistent with edema or atypical/viral infection."  Head CT 10/7 with no acute findings. Pt with medical history significant of AAA, anxiety, hyperlipidemia, peripheral vascular disease, small cell lung carcinoma, atrial flutter and more. Clinical Impression: Clinical Impression: Mr. Soward presents with an oropharyngeal dysphagia with trace aspiration (both silent and with delayed cough response) of thin liquids.  There is reduced mobility of the larynx and reduced pharyngeal stripping/base of tongue  contraction. This generalized weakness results in diffuse residue of materials after the swallow, spillage of liquids into larynx before and after the swallow, and trace aspiration of thinner liquids.  Cough was weak and not consistently effective in clearing aspiration or penetrant. Efforts to try postural adjustments and supraglottic swallow technique were not effective.  Recommend thickening liquids to nectar for now. Pt would benefit from palliative care consult given recurring pna and poor potential for improvement in swallow function. D/W Dr. Thedore Mins. Will follow. Factors that may increase risk of adverse event in presence of aspiration Rubye Oaks & Clearance Coots 2021): Factors that may increase risk of adverse event in presence of aspiration Rubye Oaks & Clearance Coots 2021): Poor general health and/or compromised immunity; Respiratory or GI disease; Limited mobility; Frail or deconditioned; Frequent aspiration of large volumes Recommendations/Plan: Swallowing Evaluation Recommendations Swallowing Evaluation Recommendations Recommendations: PO diet PO Diet Recommendation: Regular; Mildly thick liquids (Level 2, nectar thick) Liquid Administration via: Cup; Straw Medication Administration: Crushed with puree Supervision: Patient able to self-feed Swallowing strategies  : Small bites/sips; Slow rate Postural changes: Position pt fully upright for meals Oral care recommendations: Oral care BID (2x/day) Treatment Plan Treatment Plan Treatment recommendations: Therapy as outlined in treatment plan below Follow-up recommendations: Other (comment) (tba) Functional status assessment: Patient has had a recent decline in their functional status and demonstrates the ability to make significant improvements in function in a reasonable and predictable amount of time. Treatment frequency: Min 2x/week Treatment duration: 2 weeks Interventions: Patient/family education; Oropharyngeal exercises Recommendations Recommendations for follow up  therapy are one component of a multi-disciplinary discharge planning process, led by the attending physician.  Recommendations may be updated based on patient status, additional functional criteria and insurance authorization. Assessment: Orofacial Exam: Orofacial Exam Oral Cavity: Oral Hygiene: WFL Oral Cavity - Dentition: Dentures, top Orofacial Anatomy: WFL Oral Motor/Sensory Function: WFL Anatomy: Anatomy: Suspected cervical osteophytes Boluses Administered:  ABI/TBIToday's ABIToday's TBIPrevious ABIPrevious TBI +-------+-----------+-----------+------------+------------+ Right                        0.82        0.45         +-------+-----------+-----------+------------+------------+ Left   0.57       0.49       0.83        0.69         +-------+-----------+-----------+------------+------------+  Summary: Right: Incomplete study on the right side due to patient constant movement. Left: Resting left ankle-brachial index indicates moderate left lower extremity arterial disease. The left toe-brachial index is abnormal. *See table(s) above for measurements and observations.  Electronically signed by Carolynn Sayers on 12/21/2022 at 5:21:43 PM.    Final    DG Swallowing Func-Speech Pathology  Result Date: 12/19/2022 Table formatting from the original result was not included. Modified Barium Swallow Study Patient Details Name: Edward Campos MRN: [id number] Date of Birth: 26-Jan-1933 Today's Date: 12/19/2022 HPI/PMH: HPI: Jackie Littlejohn is a 89 y.o. male who presented the ED with a chief complaint of altered mental status. CXR 10/7: "diffuse bilateral interstitial pulmonary opacity, consistent with edema or atypical/viral infection."  Head CT 10/7 with no acute findings. Pt with medical history significant of AAA, anxiety, hyperlipidemia, peripheral vascular disease, small cell lung carcinoma, atrial flutter and more. Clinical Impression: Clinical Impression: Mr. Soward presents with an oropharyngeal dysphagia with trace aspiration (both silent and with delayed cough response) of thin liquids.  There is reduced mobility of the larynx and reduced pharyngeal stripping/base of tongue  contraction. This generalized weakness results in diffuse residue of materials after the swallow, spillage of liquids into larynx before and after the swallow, and trace aspiration of thinner liquids.  Cough was weak and not consistently effective in clearing aspiration or penetrant. Efforts to try postural adjustments and supraglottic swallow technique were not effective.  Recommend thickening liquids to nectar for now. Pt would benefit from palliative care consult given recurring pna and poor potential for improvement in swallow function. D/W Dr. Thedore Mins. Will follow. Factors that may increase risk of adverse event in presence of aspiration Rubye Oaks & Clearance Coots 2021): Factors that may increase risk of adverse event in presence of aspiration Rubye Oaks & Clearance Coots 2021): Poor general health and/or compromised immunity; Respiratory or GI disease; Limited mobility; Frail or deconditioned; Frequent aspiration of large volumes Recommendations/Plan: Swallowing Evaluation Recommendations Swallowing Evaluation Recommendations Recommendations: PO diet PO Diet Recommendation: Regular; Mildly thick liquids (Level 2, nectar thick) Liquid Administration via: Cup; Straw Medication Administration: Crushed with puree Supervision: Patient able to self-feed Swallowing strategies  : Small bites/sips; Slow rate Postural changes: Position pt fully upright for meals Oral care recommendations: Oral care BID (2x/day) Treatment Plan Treatment Plan Treatment recommendations: Therapy as outlined in treatment plan below Follow-up recommendations: Other (comment) (tba) Functional status assessment: Patient has had a recent decline in their functional status and demonstrates the ability to make significant improvements in function in a reasonable and predictable amount of time. Treatment frequency: Min 2x/week Treatment duration: 2 weeks Interventions: Patient/family education; Oropharyngeal exercises Recommendations Recommendations for follow up  therapy are one component of a multi-disciplinary discharge planning process, led by the attending physician.  Recommendations may be updated based on patient status, additional functional criteria and insurance authorization. Assessment: Orofacial Exam: Orofacial Exam Oral Cavity: Oral Hygiene: WFL Oral Cavity - Dentition: Dentures, top Orofacial Anatomy: WFL Oral Motor/Sensory Function: WFL Anatomy: Anatomy: Suspected cervical osteophytes Boluses Administered:  ALBUMIN 2.8*  --   --   --  2.4*  --   --   --   --   --   --   CRP  --   --   --   --   --   --  13.5* 5.2* 4.6* 7.9* 7.2*  PROCALCITON  --   --   --  0.80  --  1.01  --  0.95 0.94 1.00  --   LATICACIDVEN  --   --   --  1.9 1.8  --   --   --   --   --   --   BNP  --   --  421.2*  --   --  301.2*  --  341.6* 375.6* 316.1*  --   MG  --    < >  --   --  1.7 2.1  --  1.9 1.9 1.9 1.8  CALCIUM 9.5  --   --   --  8.9 9.4  --  10.0 9.4 9.1 8.9   < > = values in this interval not displayed.      Recent Labs  Lab 12/17/22 1428 12/17/22 1627 12/17/22 2102 12/18/22 0318 12/19/22 0335 12/19/22 3875 12/20/22 0617 12/21/22 0411 12/22/22 0310 12/23/22 0825  CRP  --   --   --   --   --  13.5* 5.2* 4.6* 7.9* 7.2*  PROCALCITON  --   --  0.80  --  1.01  --  0.95 0.94 1.00  --   LATICACIDVEN  --   --  1.9 1.8  --   --   --   --   --   --   BNP  --  421.2*  --   --  301.2*  --  341.6* 375.6* 316.1*  --   MG   < >  --   --  1.7 2.1  --  1.9 1.9 1.9 1.8  CALCIUM  --   --   --  8.9 9.4  --  10.0 9.4 9.1 8.9   < > = values in this interval not displayed.    --------------------------------------------------------------------------------------------------------------- Lab Results  Component Value Date   CHOL 58 10/26/2022   HDL 15 (L) 10/26/2022   LDLCALC 29 10/26/2022   TRIG 68 10/26/2022   CHOLHDL 3.9 10/26/2022    Lab Results  Component Value Date   HGBA1C 5.0 05/29/2019    Radiology Reports DG Chest Port 1 View  Result Date: 12/22/2022 CLINICAL DATA:  Shortness of breath EXAM: PORTABLE CHEST - 1 VIEW COMPARISON:  12/19/2022 FINDINGS: Hazy poorly marginated airspace opacities throughout both lungs with a predominantly perihilar distribution, left greater than right. Left hemidiaphragm is obscured. Heart size and mediastinal contours are within normal limits.  Aortic Atherosclerosis (ICD10-170.0). No definite effusion. Visualized bones unremarkable. IMPRESSION: Bilateral perihilar airspace disease, left greater than right. Electronically Signed   By: Corlis Leak M.D.   On: 12/22/2022 08:47   VAS [place] FEMORAL-FEMORAL BYPASS GRAFT  Result Date: 12/21/2022 LOWER EXTREMITY ARTERIAL DUPLEX STUDY Patient Name:  Edward Campos  Date of Exam:   12/21/2022 Medical Rec #: [id number]       Accession #:    [id number] Date of Birth: 11-26-1932        Patient Gender: M Patient Age:   35 years Exam Location:  [facility] Procedure:      VAS [place] FEMORAL-FEMORAL BYPASS GRAFT Referring Phys: Heath Lark --------------------------------------------------------------------------------  Indications: Peripheral artery  PROGRESS NOTE                                                                                                                                                                                                             Patient Demographics:    Edward Campos, is a 89 y.o. male, DOB - Mar 21, 1932, ZOX:[id number]  Outpatient Primary MD for the patient is Patient, No Pcp Per    LOS - 6  Admit date - 12/17/2022    Chief Complaint  Patient presents with   Altered Mental Status       Brief Narrative (HPI from H&P)   89 y.o. male with medical history significant of AAA, anxiety, hyperlipidemia, peripheral vascular disease, small cell lung carcinoma, atrial flutter and more presented to the ED with a chief complaint of altered mental status.  He is from a nursing facility and staff there reported onset of shortness of breath over the last few days.    Subjective:   Patient in bed, appears comfortable, denies any headache, no fever, no chest pain or pressure, no shortness of breath , no abdominal pain. No new focal weakness.   Assessment  & Plan :   Acute metabolic encephalopathy due to sepsis from community-acquired pneumonia/aspiration pneumonia with acute hypoxic respiratory failure in the presence of left small cell cancer.  Apparently at baseline patient has normal mentation.  Head CT unremarkable, no focal deficits, continue supportive care, minimize narcotics and benzodiazepines.  Overall quite frail, underlying non-small cell lung cancer, now encephalopathy with hospital-acquired delirium, prognosis does not look good, gradual but definite decline on a daily basis.  Palliative care on board, made DNR, if decline continues then may have to transition him to comfort measures, discussed with patient's daughter in detail on 12/22/2022.    Sepsis from community-acquired pneumonia/aspiration pneumonia with acute hypoxic respiratory  failure in the presence of left small cell cancer.     Pneumonia could be due to microaspiration versus postobstructive pneumonia from underlying small cell cancer, mildly elevated CRP and procalcitonin, with empiric antibiotics improving, therapy following microaspiration confirmed, discussed with daughter, seen by palliative care this admission, for now continue full code and present line of care, encouraged to sit in chair use I-S and flutter valve for pulmonary toiletry.  Follow cultures, post discharge follow-up with oncology as soon as possible to  PTA Distal                              appears occluded +-----------+--------+-----+--------+--------+----------------+ PERO Distal                             appears occluded +-----------+--------+-----+--------+--------+----------------+  Summary: Right: Patent femoral-femoral bypass graft without evidence of significant stenosis  See table(s) above for measurements and observations. Electronically signed by Carolynn Sayers on 12/21/2022 at 5:22:00 PM.    Final    VAS [place] ABI WITH/WO TBI  Result Date: 12/21/2022  LOWER EXTREMITY DOPPLER STUDY Patient Name:  Edward Campos  Date of Exam:   12/21/2022 Medical Rec #: [id number]       Accession #:    [id number] Date of Birth: Jun 18, 1932        Patient Gender: M Patient Age:   89 years Exam Location:  [facility] Procedure:      VAS [place] ABI  WITH/WO TBI Referring Phys: Heath Lark --------------------------------------------------------------------------------  Indications: Peripheral artery disease. History of excision bypass graft              femoral-femoral and redo fem-femo graft using cryo saphenous vein              10/25/22.  Limitations: Today's exam was limited due to involuntary patient movement and .              Not able to obtain pressures on the right side and left PTA. Performing Technologist: Marilynne Halsted RDMS, RVT  Examination Guidelines: A complete evaluation includes at minimum, Doppler waveform signals and systolic blood pressure reading at the level of bilateral brachial, anterior tibial, and posterior tibial arteries, when vessel segments are accessible. Bilateral testing is considered an integral part of a complete examination. Photoelectric Plethysmograph (PPG) waveforms and toe systolic pressure readings are included as required and additional duplex testing as needed. Limited examinations for reoccurring indications may be performed as noted.  ABI Findings: +--------+------------------+-----+---------+--------+ Right   Rt Pressure (mmHg)IndexWaveform Comment  +--------+------------------+-----+---------+--------+ [id number]                    triphasic         +--------+------------------+-----+---------+--------+ +---------+------------------+-----+----------+---------------------------+ Left     Lt Pressure (mmHg)IndexWaveform  Comment                     +---------+------------------+-----+----------+---------------------------+ Brachial 127                    biphasic                              +---------+------------------+-----+----------+---------------------------+ PERO                            monophasicNot able to obtain pressure +---------+------------------+-----+----------+---------------------------+ DP       83                0.57 monophasic                             +---------+------------------+-----+----------+---------------------------+ Great Toe71                0.49 Abnormal                              +---------+------------------+-----+----------+---------------------------+ +-------+-----------+-----------+------------+------------+  PTA Distal                              appears occluded +-----------+--------+-----+--------+--------+----------------+ PERO Distal                             appears occluded +-----------+--------+-----+--------+--------+----------------+  Summary: Right: Patent femoral-femoral bypass graft without evidence of significant stenosis  See table(s) above for measurements and observations. Electronically signed by Carolynn Sayers on 12/21/2022 at 5:22:00 PM.    Final    VAS [place] ABI WITH/WO TBI  Result Date: 12/21/2022  LOWER EXTREMITY DOPPLER STUDY Patient Name:  Edward Campos  Date of Exam:   12/21/2022 Medical Rec #: [id number]       Accession #:    [id number] Date of Birth: Jun 18, 1932        Patient Gender: M Patient Age:   89 years Exam Location:  [facility] Procedure:      VAS [place] ABI  WITH/WO TBI Referring Phys: Heath Lark --------------------------------------------------------------------------------  Indications: Peripheral artery disease. History of excision bypass graft              femoral-femoral and redo fem-femo graft using cryo saphenous vein              10/25/22.  Limitations: Today's exam was limited due to involuntary patient movement and .              Not able to obtain pressures on the right side and left PTA. Performing Technologist: Marilynne Halsted RDMS, RVT  Examination Guidelines: A complete evaluation includes at minimum, Doppler waveform signals and systolic blood pressure reading at the level of bilateral brachial, anterior tibial, and posterior tibial arteries, when vessel segments are accessible. Bilateral testing is considered an integral part of a complete examination. Photoelectric Plethysmograph (PPG) waveforms and toe systolic pressure readings are included as required and additional duplex testing as needed. Limited examinations for reoccurring indications may be performed as noted.  ABI Findings: +--------+------------------+-----+---------+--------+ Right   Rt Pressure (mmHg)IndexWaveform Comment  +--------+------------------+-----+---------+--------+ [id number]                    triphasic         +--------+------------------+-----+---------+--------+ +---------+------------------+-----+----------+---------------------------+ Left     Lt Pressure (mmHg)IndexWaveform  Comment                     +---------+------------------+-----+----------+---------------------------+ Brachial 127                    biphasic                              +---------+------------------+-----+----------+---------------------------+ PERO                            monophasicNot able to obtain pressure +---------+------------------+-----+----------+---------------------------+ DP       83                0.57 monophasic                             +---------+------------------+-----+----------+---------------------------+ Great Toe71                0.49 Abnormal                              +---------+------------------+-----+----------+---------------------------+ +-------+-----------+-----------+------------+------------+

## 2022-12-23 NOTE — Plan of Care (Signed)
Pt is alert and oriented x 1. Pt slightly agitated during overnight. Pt thinks he's not been given food. Meals document pt has at 25- 100% of meals. Breakfast 100. Pt thinks he's [facility] and they've brought him back in for service. Pt informed of his dx that caused some confusion. Pt a little more cooperative after oriented again this am. Vitals stable. Afebrile.  Problem: Education: Goal: Knowledge of prescribed regimen will improve Outcome: Not Progressing   Problem: Nutrition: Goal: Adequate nutrition will be maintained Outcome: Not Progressing   Problem: Activity: Goal: Ability to tolerate increased activity will improve Outcome: Progressing   Problem: Bowel/Gastric: Goal: Gastrointestinal status for postoperative course will improve Outcome: Progressing   Problem: Clinical Measurements: Goal: Postoperative complications will be avoided or minimized Outcome: Progressing Goal: Signs and symptoms of graft occlusion will improve Outcome: Progressing   Problem: Skin Integrity: Goal: Demonstration of wound healing without infection will improve Outcome: Progressing   Problem: Activity: Goal: Ability to tolerate increased activity will improve Outcome: Progressing   Problem: Clinical Measurements: Goal: Ability to maintain a body temperature in the normal range will improve Outcome: Progressing   Problem: Respiratory: Goal: Ability to maintain adequate ventilation will improve Outcome: Progressing Goal: Ability to maintain a clear airway will improve Outcome: Progressing   Problem: Education: Goal: Knowledge of General Education information will improve Description: Including pain rating scale, medication(s)/side effects and non-pharmacologic comfort measures Outcome: Progressing   Problem: Health Behavior/Discharge Planning: Goal: Ability to manage health-related needs will improve Outcome: Progressing   Problem: Clinical Measurements: Goal:  Ability to maintain clinical measurements within normal limits will improve Outcome: Progressing Goal: Will remain free from infection Outcome: Progressing Goal: Diagnostic test results will improve Outcome: Progressing Goal: Respiratory complications will improve Outcome: Progressing Goal: Cardiovascular complication will be avoided Outcome: Progressing   Problem: Activity: Goal: Risk for activity intolerance will decrease Outcome: Progressing   Problem: Coping: Goal: Level of anxiety will decrease Outcome: Progressing   Problem: Elimination: Goal: Will not experience complications related to bowel motility Outcome: Progressing Goal: Will not experience complications related to urinary retention Outcome: Progressing   Problem: Pain Managment: Goal: General experience of comfort will improve Outcome: Progressing   Problem: Safety: Goal: Ability to remain free from injury will improve Outcome: Progressing   Problem: Skin Integrity: Goal: Risk for impaired skin integrity will decrease Outcome: Progressing

## 2022-12-24 ENCOUNTER — Inpatient Hospital Stay (HOSPITAL_COMMUNITY): Payer: Medicare Other

## 2022-12-24 DIAGNOSIS — R531 Weakness: Secondary | ICD-10-CM | POA: Diagnosis not present

## 2022-12-24 DIAGNOSIS — Z1152 Encounter for screening for COVID-19: Secondary | ICD-10-CM | POA: Diagnosis not present

## 2022-12-24 DIAGNOSIS — A419 Sepsis, unspecified organism: Secondary | ICD-10-CM | POA: Diagnosis not present

## 2022-12-24 DIAGNOSIS — J984 Other disorders of lung: Secondary | ICD-10-CM | POA: Diagnosis not present

## 2022-12-24 DIAGNOSIS — Z66 Do not resuscitate: Secondary | ICD-10-CM | POA: Diagnosis not present

## 2022-12-24 DIAGNOSIS — C3432 Malignant neoplasm of lower lobe, left bronchus or lung: Secondary | ICD-10-CM | POA: Diagnosis not present

## 2022-12-24 DIAGNOSIS — Z7901 Long term (current) use of anticoagulants: Secondary | ICD-10-CM | POA: Diagnosis not present

## 2022-12-24 DIAGNOSIS — I700 Atherosclerosis of aorta: Secondary | ICD-10-CM | POA: Diagnosis not present

## 2022-12-24 DIAGNOSIS — R5383 Other fatigue: Secondary | ICD-10-CM | POA: Diagnosis not present

## 2022-12-24 DIAGNOSIS — N183 Chronic kidney disease, stage 3 unspecified: Secondary | ICD-10-CM | POA: Diagnosis not present

## 2022-12-24 DIAGNOSIS — Z85828 Personal history of other malignant neoplasm of skin: Secondary | ICD-10-CM | POA: Diagnosis not present

## 2022-12-24 DIAGNOSIS — M6281 Muscle weakness (generalized): Secondary | ICD-10-CM | POA: Diagnosis not present

## 2022-12-24 DIAGNOSIS — Z7401 Bed confinement status: Secondary | ICD-10-CM | POA: Diagnosis not present

## 2022-12-24 DIAGNOSIS — J690 Pneumonitis due to inhalation of food and vomit: Secondary | ICD-10-CM | POA: Diagnosis not present

## 2022-12-24 DIAGNOSIS — Z515 Encounter for palliative care: Secondary | ICD-10-CM | POA: Diagnosis not present

## 2022-12-24 DIAGNOSIS — I5033 Acute on chronic diastolic (congestive) heart failure: Secondary | ICD-10-CM | POA: Diagnosis not present

## 2022-12-24 DIAGNOSIS — T827XXD Infection and inflammatory reaction due to other cardiac and vascular devices, implants and grafts, subsequent encounter: Secondary | ICD-10-CM | POA: Diagnosis not present

## 2022-12-24 DIAGNOSIS — E871 Hypo-osmolality and hyponatremia: Secondary | ICD-10-CM | POA: Diagnosis not present

## 2022-12-24 DIAGNOSIS — I739 Peripheral vascular disease, unspecified: Secondary | ICD-10-CM | POA: Diagnosis not present

## 2022-12-24 DIAGNOSIS — I483 Typical atrial flutter: Secondary | ICD-10-CM | POA: Diagnosis not present

## 2022-12-24 DIAGNOSIS — R0602 Shortness of breath: Secondary | ICD-10-CM | POA: Diagnosis not present

## 2022-12-24 DIAGNOSIS — Z87891 Personal history of nicotine dependence: Secondary | ICD-10-CM | POA: Diagnosis not present

## 2022-12-24 DIAGNOSIS — Z7902 Long term (current) use of antithrombotics/antiplatelets: Secondary | ICD-10-CM | POA: Diagnosis not present

## 2022-12-24 DIAGNOSIS — R000 Tachycardia, unspecified: Secondary | ICD-10-CM | POA: Diagnosis not present

## 2022-12-24 DIAGNOSIS — R7401 Elevation of levels of liver transaminase levels: Secondary | ICD-10-CM | POA: Diagnosis not present

## 2022-12-24 DIAGNOSIS — J9621 Acute and chronic respiratory failure with hypoxia: Secondary | ICD-10-CM | POA: Diagnosis not present

## 2022-12-24 DIAGNOSIS — Z789 Other specified health status: Secondary | ICD-10-CM | POA: Diagnosis not present

## 2022-12-24 DIAGNOSIS — R0603 Acute respiratory distress: Secondary | ICD-10-CM | POA: Diagnosis not present

## 2022-12-24 DIAGNOSIS — I714 Abdominal aortic aneurysm, without rupture, unspecified: Secondary | ICD-10-CM | POA: Diagnosis not present

## 2022-12-24 DIAGNOSIS — Z7982 Long term (current) use of aspirin: Secondary | ICD-10-CM | POA: Diagnosis not present

## 2022-12-24 DIAGNOSIS — R0902 Hypoxemia: Secondary | ICD-10-CM | POA: Diagnosis not present

## 2022-12-24 DIAGNOSIS — E785 Hyperlipidemia, unspecified: Secondary | ICD-10-CM | POA: Diagnosis not present

## 2022-12-24 DIAGNOSIS — R0689 Other abnormalities of breathing: Secondary | ICD-10-CM | POA: Diagnosis not present

## 2022-12-24 DIAGNOSIS — C7951 Secondary malignant neoplasm of bone: Secondary | ICD-10-CM | POA: Diagnosis not present

## 2022-12-24 DIAGNOSIS — D649 Anemia, unspecified: Secondary | ICD-10-CM | POA: Diagnosis not present

## 2022-12-24 DIAGNOSIS — R918 Other nonspecific abnormal finding of lung field: Secondary | ICD-10-CM | POA: Diagnosis not present

## 2022-12-24 DIAGNOSIS — E43 Unspecified severe protein-calorie malnutrition: Secondary | ICD-10-CM | POA: Diagnosis not present

## 2022-12-24 DIAGNOSIS — R0609 Other forms of dyspnea: Secondary | ICD-10-CM | POA: Diagnosis present

## 2022-12-24 DIAGNOSIS — E870 Hyperosmolality and hypernatremia: Secondary | ICD-10-CM | POA: Diagnosis not present

## 2022-12-24 DIAGNOSIS — Z79899 Other long term (current) drug therapy: Secondary | ICD-10-CM | POA: Diagnosis not present

## 2022-12-24 DIAGNOSIS — B9562 Methicillin resistant Staphylococcus aureus infection as the cause of diseases classified elsewhere: Secondary | ICD-10-CM | POA: Diagnosis not present

## 2022-12-24 DIAGNOSIS — Z7189 Other specified counseling: Secondary | ICD-10-CM | POA: Diagnosis not present

## 2022-12-24 DIAGNOSIS — R2681 Unsteadiness on feet: Secondary | ICD-10-CM | POA: Diagnosis not present

## 2022-12-24 DIAGNOSIS — J189 Pneumonia, unspecified organism: Secondary | ICD-10-CM | POA: Diagnosis not present

## 2022-12-24 DIAGNOSIS — R2689 Other abnormalities of gait and mobility: Secondary | ICD-10-CM | POA: Diagnosis not present

## 2022-12-24 DIAGNOSIS — G9341 Metabolic encephalopathy: Secondary | ICD-10-CM | POA: Diagnosis not present

## 2022-12-24 DIAGNOSIS — R6520 Severe sepsis without septic shock: Secondary | ICD-10-CM | POA: Diagnosis not present

## 2022-12-24 DIAGNOSIS — F05 Delirium due to known physiological condition: Secondary | ICD-10-CM | POA: Diagnosis not present

## 2022-12-24 DIAGNOSIS — A4102 Sepsis due to Methicillin resistant Staphylococcus aureus: Secondary | ICD-10-CM | POA: Diagnosis not present

## 2022-12-24 DIAGNOSIS — K5900 Constipation, unspecified: Secondary | ICD-10-CM | POA: Diagnosis not present

## 2022-12-24 DIAGNOSIS — E876 Hypokalemia: Secondary | ICD-10-CM | POA: Diagnosis not present

## 2022-12-24 DIAGNOSIS — R1312 Dysphagia, oropharyngeal phase: Secondary | ICD-10-CM | POA: Diagnosis not present

## 2022-12-24 DIAGNOSIS — C3430 Malignant neoplasm of lower lobe, unspecified bronchus or lung: Secondary | ICD-10-CM | POA: Diagnosis not present

## 2022-12-24 DIAGNOSIS — R0989 Other specified symptoms and signs involving the circulatory and respiratory systems: Secondary | ICD-10-CM | POA: Diagnosis not present

## 2022-12-24 DIAGNOSIS — I5032 Chronic diastolic (congestive) heart failure: Secondary | ICD-10-CM | POA: Diagnosis not present

## 2022-12-24 DIAGNOSIS — J9601 Acute respiratory failure with hypoxia: Secondary | ICD-10-CM | POA: Diagnosis not present

## 2022-12-24 DIAGNOSIS — I4891 Unspecified atrial fibrillation: Secondary | ICD-10-CM | POA: Diagnosis not present

## 2022-12-24 DIAGNOSIS — K219 Gastro-esophageal reflux disease without esophagitis: Secondary | ICD-10-CM | POA: Diagnosis not present

## 2022-12-24 LAB — CBC WITH DIFFERENTIAL/PLATELET
Abs Immature Granulocytes: 0.19 10*3/uL — ABNORMAL HIGH (ref 0.00–0.07)
Basophils Absolute: 0.1 10*3/uL (ref 0.0–0.1)
Basophils Relative: 1 %
Eosinophils Absolute: 0 10*3/uL (ref 0.0–0.5)
Eosinophils Relative: 0 %
HCT: 30.9 % — ABNORMAL LOW (ref 39.0–52.0)
Hemoglobin: 9.9 g/dL — ABNORMAL LOW (ref 13.0–17.0)
Immature Granulocytes: 2 %
Lymphocytes Relative: 6 %
Lymphs Abs: 0.5 10*3/uL — ABNORMAL LOW (ref 0.7–4.0)
MCH: 27.4 pg (ref 26.0–34.0)
MCHC: 32 g/dL (ref 30.0–36.0)
MCV: 85.6 fL (ref 80.0–100.0)
Monocytes Absolute: 0.7 10*3/uL (ref 0.1–1.0)
Monocytes Relative: 8 %
Neutro Abs: 6.4 10*3/uL (ref 1.7–7.7)
Neutrophils Relative %: 83 %
Platelets: 291 10*3/uL (ref 150–400)
RBC: 3.61 MIL/uL — ABNORMAL LOW (ref 4.22–5.81)
RDW: 17 % — ABNORMAL HIGH (ref 11.5–15.5)
WBC: 7.8 10*3/uL (ref 4.0–10.5)
nRBC: 0 % (ref 0.0–0.2)

## 2022-12-24 LAB — BASIC METABOLIC PANEL
Anion gap: 11 (ref 5–15)
BUN: 21 mg/dL (ref 8–23)
CO2: 26 mmol/L (ref 22–32)
Calcium: 9.5 mg/dL (ref 8.9–10.3)
Chloride: 106 mmol/L (ref 98–111)
Creatinine, Ser: 1.15 mg/dL (ref 0.61–1.24)
GFR, Estimated: >60 mL/min (ref >60)
Glucose, Bld: 106 mg/dL — ABNORMAL HIGH (ref 70–99)
Potassium: 3.7 mmol/L (ref 3.5–5.1)
Sodium: 143 mmol/L (ref 135–145)

## 2022-12-24 LAB — PROCALCITONIN: Procalcitonin: 0.93 ng/mL

## 2022-12-24 MED ORDER — DILTIAZEM HCL ER COATED BEADS 180 MG PO CP24
180.0000 mg | ORAL_CAPSULE | Freq: Every day | ORAL | Status: DC
  Administered 2022-12-24: 180 mg via ORAL
  Filled 2022-12-24: qty 1

## 2022-12-24 MED ORDER — ALBUTEROL SULFATE HFA 108 (90 BASE) MCG/ACT IN AERS
2.0000 | INHALATION_SPRAY | Freq: Four times a day (QID) | RESPIRATORY_TRACT | Status: DC | PRN
Start: 2022-12-24 — End: 2023-01-07

## 2022-12-24 MED ORDER — HALOPERIDOL LACTATE 5 MG/ML IJ SOLN: 2.0000 mg | INTRAMUSCULAR | Status: DC

## 2022-12-24 MED ORDER — METOPROLOL TARTRATE 25 MG PO TABS
25.0000 mg | ORAL_TABLET | Freq: Two times a day (BID) | ORAL | Status: DC
  Administered 2022-12-24: 25 mg via ORAL
  Filled 2022-12-24: qty 1

## 2022-12-24 MED ORDER — METOPROLOL TARTRATE 5 MG/5ML IV SOLN
2.5000 mg | INTRAVENOUS | Status: DC
  Filled 2022-12-24: qty 5

## 2022-12-24 MED ORDER — AZITHROMYCIN 500 MG PO TABS: 500.0000 mg | ORAL_TABLET | Freq: Every day | ORAL | Status: DC

## 2022-12-24 MED ORDER — HALOPERIDOL LACTATE 5 MG/ML IJ SOLN: 2.0000 mg | Freq: Once | INTRAMUSCULAR | Status: DC

## 2022-12-24 NOTE — Discharge Summary (Signed)
Mid graft         117                              +------------------+--------+--------+--------+--------+ Distal graft      223                              +------------------+--------+--------+--------+--------+ Distal anastomosis185                               +------------------+--------+--------+--------+--------+ Outflow           88                      Rt cfa   +------------------+--------+--------+--------+--------+   +-----------+--------+-----+--------+--------+----------------+ RIGHT      PSV cm/sRatioStenosisWaveformComments         +-----------+--------+-----+--------+--------+----------------+ DFA        47                                            +-----------+--------+-----+--------+--------+----------------+ SFA Prox   0            occluded                         +-----------+--------+-----+--------+--------+----------------+ SFA Distal 0            occluded                         +-----------+--------+-----+--------+--------+----------------+ POP Prox   0            occluded                         +-----------+--------+-----+--------+--------+----------------+ PTA Distal                              appears occluded +-----------+--------+-----+--------+--------+----------------+ PERO Distal                             appears occluded +-----------+--------+-----+--------+--------+----------------+  Summary: Right: Patent femoral-femoral bypass graft without evidence of significant stenosis  See table(s) above for measurements and observations. Electronically signed by Carolynn Sayers on 12/21/2022 at 5:22:00 PM.    Final    VAS [place] ABI WITH/WO TBI  Result Date: 12/21/2022  LOWER EXTREMITY DOPPLER STUDY Patient Name:  Edward Campos  Date of Exam:   12/21/2022 Medical Rec #: [id number]       Accession #:    [id number] Date of Birth: 07/09/1932        Patient Gender: M Patient Age:   89 years Exam Location:  [facility] Procedure:      VAS [place] ABI WITH/WO TBI Referring Phys: Heath Lark --------------------------------------------------------------------------------  Indications: Peripheral artery disease. History of excision bypass graft              femoral-femoral  and redo fem-femo graft using cryo saphenous vein              10/25/22.  Limitations: Today's exam was limited due to involuntary patient movement and .  CT 09/24/2022. FINDINGS: The cardiomediastinal silhouette is unchanged. Aortic atherosclerosis. A known left perihilar/left lower lobe lung mass was better appreciated on the prior chest CT of 09/24/2022. Patchy airspace disease with a lower lobe predominance, similar to slightly improved as compared to the prior chest radiographs of 12/22/2022. Persistent mild background prominence of the interstitial lung markings suggesting a component of interstitial edema. Silhouette and of the left hemidiaphragm with blunting of the left lateral costophrenic angle. A small left pleural effusion is suspected. No evidence of pneumothorax. IMPRESSION: 1. A known left perihilar/left lower lobe lung mass was better appreciated on the prior chest CT of 09/24/2022. 2. Patchy bilateral airspace disease with a lower lobe predominance, likely reflecting pneumonia. These findings are similar to slightly improved as compared to the prior chest radiographs of 12/22/2022. 3. Persistent mild background prominence of the interstitial lung markings suggesting a component of interstitial edema. 4. Probable small left pleural effusion. 5. Aortic Atherosclerosis (ICD10-I70.0). Electronically Signed   By: Jackey Loge D.O.   On: 12/24/2022 07:25   DG Chest Port 1 View  Result Date: 12/22/2022 CLINICAL DATA:  Shortness of breath EXAM: PORTABLE CHEST - 1 VIEW COMPARISON:  12/19/2022 FINDINGS: Hazy poorly marginated airspace opacities throughout both lungs with a predominantly perihilar distribution, left greater than right. Left hemidiaphragm is obscured. Heart size and mediastinal contours are within normal limits. Aortic Atherosclerosis (ICD10-170.0). No definite effusion. Visualized bones unremarkable. IMPRESSION: Bilateral perihilar airspace disease, left greater than right. Electronically Signed   By: Corlis Leak M.D.   On: 12/22/2022  08:47   VAS [place] FEMORAL-FEMORAL BYPASS GRAFT  Result Date: 12/21/2022 LOWER EXTREMITY ARTERIAL DUPLEX STUDY Patient Name:  Edward Campos  Date of Exam:   12/21/2022 Medical Rec #: [id number]       Accession #:    [id number] Date of Birth: 06/18/1932        Patient Gender: M Patient Age:   73 years Exam Location:  [facility] Procedure:      VAS [place] FEMORAL-FEMORAL BYPASS GRAFT Referring Phys: Heath Lark --------------------------------------------------------------------------------  Indications: Peripheral artery disease.  Vascular Interventions: 10/25/22 - Excision bypass graft femoral-femoral artery                         and redo femoral-femoral graft using cryo-saphenous                         vein. Current ABI:            Rt. Not obtainable. Left 0.57 Performing Technologist: [place] Sink Sturdivant RDMS, RVT  Examination Guidelines: A complete evaluation includes B-mode imaging, spectral Doppler, color Doppler, and power Doppler as needed of all accessible portions of each vessel. Bilateral testing is considered an integral part of a complete examination. Limited examinations for reoccurring indications may be performed as noted.  Fem Fem Graft: +------------------+--------+--------+--------+--------+                   PSV cm/sStenosisWaveformComments +------------------+--------+--------+--------+--------+ Inflow            167             biphasic         +------------------+--------+--------+--------+--------+ Prox anastomosis  172             biphasic         +------------------+--------+--------+--------+--------+ Proximal graft    117                              +------------------+--------+--------+--------+--------+  Not able to obtain pressures on the right side and left PTA. Performing Technologist: Marilynne Halsted RDMS, RVT  Examination Guidelines: A complete evaluation includes at minimum, Doppler waveform signals and systolic blood pressure reading at the level of bilateral brachial, anterior tibial, and posterior tibial arteries, when vessel segments are accessible. Bilateral testing is considered an integral part of a complete examination. Photoelectric Plethysmograph (PPG) waveforms and toe systolic pressure readings are included as required and additional duplex testing as needed. Limited examinations for reoccurring indications may be performed as noted.  ABI Findings: +--------+------------------+-----+---------+--------+ Right   Rt Pressure (mmHg)IndexWaveform Comment  +--------+------------------+-----+---------+--------+ [id number]                    triphasic         +--------+------------------+-----+---------+--------+ +---------+------------------+-----+----------+---------------------------+ Left     Lt Pressure (mmHg)IndexWaveform  Comment                     +---------+------------------+-----+----------+---------------------------+ Brachial 127                    biphasic                              +---------+------------------+-----+----------+---------------------------+ PERO                            monophasicNot able to obtain pressure +---------+------------------+-----+----------+---------------------------+ DP       83                0.57 monophasic                            +---------+------------------+-----+----------+---------------------------+ Great Toe71                0.49 Abnormal                               +---------+------------------+-----+----------+---------------------------+ +-------+-----------+-----------+------------+------------+ ABI/TBIToday's ABIToday's TBIPrevious ABIPrevious TBI +-------+-----------+-----------+------------+------------+ Right                        0.82        0.45         +-------+-----------+-----------+------------+------------+ Left   0.57       0.49       0.83        0.69         +-------+-----------+-----------+------------+------------+  Summary: Right: Incomplete study on the right side due to patient constant movement. Left: Resting left ankle-brachial index indicates moderate left lower extremity arterial disease. The left toe-brachial index is abnormal. *See table(s) above for measurements and observations.  Electronically signed by Carolynn Sayers on 12/21/2022 at 5:21:43 PM.    Final    DG Swallowing Func-Speech Pathology  Result Date: 12/19/2022 Table formatting from the original result was not included. Modified Barium Swallow Study Patient Details Name: Edward Campos MRN: [id number] Date of Birth: [birth year] Today's Date: 12/19/2022 HPI/PMH: HPI: Edward Campos is a 89 y.o. male who presented the ED with a chief complaint of altered mental status. CXR 10/7: "diffuse bilateral interstitial pulmonary opacity, consistent with edema or atypical/viral infection."  Head CT 10/7 with no acute findings. Pt with medical history significant of AAA, anxiety, hyperlipidemia, peripheral vascular disease, small cell lung carcinoma, atrial flutter and more. Clinical Impression: Clinical Impression: Mr. Cournoyer  Edward Campos ZOX:[id number] DOB: 1932-04-02 DOA: 12/17/2022  PCP: Patient, No Pcp Per  Admit date: 12/17/2022  Discharge date: 12/24/2022  Admitted From: SNF   Disposition:  SNF - palliative care   Recommendations for Outpatient Follow-up:   Follow up with PCP in 1-2 weeks  PCP Please obtain BMP/CBC, 2 view CXR in 1week,  (see Discharge instructions)   PCP Please follow up on the following pending results: If clinical condition improves then outpatient oncology follow-up, if clinical condition declines then focus on comfort measures and hospice.   Home Health: None Equipment/Devices: None  Consultations: Pall.Care Discharge Condition: Stable    CODE STATUS: Full    Diet Recommendation: Heart Healthy, nectar thick diet with feeding assistance and aspiration precautions    Chief Complaint  Patient presents with   Altered Mental Status     Brief history of present illness from the day of admission and additional interim summary     89 y.o. male with medical history significant of AAA, anxiety, hyperlipidemia, peripheral vascular disease, small cell lung carcinoma, atrial flutter and more presented to the ED with a chief complaint of altered mental status.  He is from a nursing facility and staff there reported onset of shortness of breath over the last few days.                                                                   Hospital Course   Acute metabolic encephalopathy due to sepsis from community-acquired pneumonia/aspiration pneumonia with acute hypoxic respiratory failure in the presence of left small cell cancer.  Apparently at baseline patient has normal mentation.  Head CT unremarkable, no focal deficits, continue supportive care, minimize narcotics and benzodiazepines.  Overall quite frail,  underlying non-small cell lung cancer, now encephalopathy with hospital-acquired delirium, prognosis does not look good, gradual but definite decline on a daily basis.  Palliative care on board, made DNR, if decline continues then may have to transition him to comfort measures, discussed with patient's daughter in detail on 12/22/2022.     Sepsis from community-acquired pneumonia/aspiration pneumonia with acute hypoxic respiratory failure in the presence of left small cell cancer.      Pneumonia could be due to microaspiration versus postobstructive pneumonia from underlying small cell cancer, mildly elevated CRP and procalcitonin, with empiric antibiotics improving, therapy following microaspiration confirmed, discussed with daughter, seen by palliative care this admission, for now continue full code and present line of care, encouraged to sit in chair use I-S and flutter valve for pulmonary toiletry.  Supportive care his pneumonia has improved but continues to have metabolic encephalopathy with some hospital-acquired delirium.  He was seen by speech therapy currently on nectar thick liquids and regular consistency, with empiric antibiotics pneumonia has improved, continue azithromycin for 3 more days at  Edward Campos ZOX:[id number] DOB: 1932-04-02 DOA: 12/17/2022  PCP: Patient, No Pcp Per  Admit date: 12/17/2022  Discharge date: 12/24/2022  Admitted From: SNF   Disposition:  SNF - palliative care   Recommendations for Outpatient Follow-up:   Follow up with PCP in 1-2 weeks  PCP Please obtain BMP/CBC, 2 view CXR in 1week,  (see Discharge instructions)   PCP Please follow up on the following pending results: If clinical condition improves then outpatient oncology follow-up, if clinical condition declines then focus on comfort measures and hospice.   Home Health: None Equipment/Devices: None  Consultations: Pall.Care Discharge Condition: Stable    CODE STATUS: Full    Diet Recommendation: Heart Healthy, nectar thick diet with feeding assistance and aspiration precautions    Chief Complaint  Patient presents with   Altered Mental Status     Brief history of present illness from the day of admission and additional interim summary     89 y.o. male with medical history significant of AAA, anxiety, hyperlipidemia, peripheral vascular disease, small cell lung carcinoma, atrial flutter and more presented to the ED with a chief complaint of altered mental status.  He is from a nursing facility and staff there reported onset of shortness of breath over the last few days.                                                                   Hospital Course   Acute metabolic encephalopathy due to sepsis from community-acquired pneumonia/aspiration pneumonia with acute hypoxic respiratory failure in the presence of left small cell cancer.  Apparently at baseline patient has normal mentation.  Head CT unremarkable, no focal deficits, continue supportive care, minimize narcotics and benzodiazepines.  Overall quite frail,  underlying non-small cell lung cancer, now encephalopathy with hospital-acquired delirium, prognosis does not look good, gradual but definite decline on a daily basis.  Palliative care on board, made DNR, if decline continues then may have to transition him to comfort measures, discussed with patient's daughter in detail on 12/22/2022.     Sepsis from community-acquired pneumonia/aspiration pneumonia with acute hypoxic respiratory failure in the presence of left small cell cancer.      Pneumonia could be due to microaspiration versus postobstructive pneumonia from underlying small cell cancer, mildly elevated CRP and procalcitonin, with empiric antibiotics improving, therapy following microaspiration confirmed, discussed with daughter, seen by palliative care this admission, for now continue full code and present line of care, encouraged to sit in chair use I-S and flutter valve for pulmonary toiletry.  Supportive care his pneumonia has improved but continues to have metabolic encephalopathy with some hospital-acquired delirium.  He was seen by speech therapy currently on nectar thick liquids and regular consistency, with empiric antibiotics pneumonia has improved, continue azithromycin for 3 more days at  CT 09/24/2022. FINDINGS: The cardiomediastinal silhouette is unchanged. Aortic atherosclerosis. A known left perihilar/left lower lobe lung mass was better appreciated on the prior chest CT of 09/24/2022. Patchy airspace disease with a lower lobe predominance, similar to slightly improved as compared to the prior chest radiographs of 12/22/2022. Persistent mild background prominence of the interstitial lung markings suggesting a component of interstitial edema. Silhouette and of the left hemidiaphragm with blunting of the left lateral costophrenic angle. A small left pleural effusion is suspected. No evidence of pneumothorax. IMPRESSION: 1. A known left perihilar/left lower lobe lung mass was better appreciated on the prior chest CT of 09/24/2022. 2. Patchy bilateral airspace disease with a lower lobe predominance, likely reflecting pneumonia. These findings are similar to slightly improved as compared to the prior chest radiographs of 12/22/2022. 3. Persistent mild background prominence of the interstitial lung markings suggesting a component of interstitial edema. 4. Probable small left pleural effusion. 5. Aortic Atherosclerosis (ICD10-I70.0). Electronically Signed   By: Jackey Loge D.O.   On: 12/24/2022 07:25   DG Chest Port 1 View  Result Date: 12/22/2022 CLINICAL DATA:  Shortness of breath EXAM: PORTABLE CHEST - 1 VIEW COMPARISON:  12/19/2022 FINDINGS: Hazy poorly marginated airspace opacities throughout both lungs with a predominantly perihilar distribution, left greater than right. Left hemidiaphragm is obscured. Heart size and mediastinal contours are within normal limits. Aortic Atherosclerosis (ICD10-170.0). No definite effusion. Visualized bones unremarkable. IMPRESSION: Bilateral perihilar airspace disease, left greater than right. Electronically Signed   By: Corlis Leak M.D.   On: 12/22/2022  08:47   VAS [place] FEMORAL-FEMORAL BYPASS GRAFT  Result Date: 12/21/2022 LOWER EXTREMITY ARTERIAL DUPLEX STUDY Patient Name:  Edward Campos  Date of Exam:   12/21/2022 Medical Rec #: [id number]       Accession #:    [id number] Date of Birth: 06/18/1932        Patient Gender: M Patient Age:   73 years Exam Location:  [facility] Procedure:      VAS [place] FEMORAL-FEMORAL BYPASS GRAFT Referring Phys: Heath Lark --------------------------------------------------------------------------------  Indications: Peripheral artery disease.  Vascular Interventions: 10/25/22 - Excision bypass graft femoral-femoral artery                         and redo femoral-femoral graft using cryo-saphenous                         vein. Current ABI:            Rt. Not obtainable. Left 0.57 Performing Technologist: [place] Sink Sturdivant RDMS, RVT  Examination Guidelines: A complete evaluation includes B-mode imaging, spectral Doppler, color Doppler, and power Doppler as needed of all accessible portions of each vessel. Bilateral testing is considered an integral part of a complete examination. Limited examinations for reoccurring indications may be performed as noted.  Fem Fem Graft: +------------------+--------+--------+--------+--------+                   PSV cm/sStenosisWaveformComments +------------------+--------+--------+--------+--------+ Inflow            167             biphasic         +------------------+--------+--------+--------+--------+ Prox anastomosis  172             biphasic         +------------------+--------+--------+--------+--------+ Proximal graft    117                              +------------------+--------+--------+--------+--------+  Mid graft         117                              +------------------+--------+--------+--------+--------+ Distal graft      223                              +------------------+--------+--------+--------+--------+ Distal anastomosis185                               +------------------+--------+--------+--------+--------+ Outflow           88                      Rt cfa   +------------------+--------+--------+--------+--------+   +-----------+--------+-----+--------+--------+----------------+ RIGHT      PSV cm/sRatioStenosisWaveformComments         +-----------+--------+-----+--------+--------+----------------+ DFA        47                                            +-----------+--------+-----+--------+--------+----------------+ SFA Prox   0            occluded                         +-----------+--------+-----+--------+--------+----------------+ SFA Distal 0            occluded                         +-----------+--------+-----+--------+--------+----------------+ POP Prox   0            occluded                         +-----------+--------+-----+--------+--------+----------------+ PTA Distal                              appears occluded +-----------+--------+-----+--------+--------+----------------+ PERO Distal                             appears occluded +-----------+--------+-----+--------+--------+----------------+  Summary: Right: Patent femoral-femoral bypass graft without evidence of significant stenosis  See table(s) above for measurements and observations. Electronically signed by Carolynn Sayers on 12/21/2022 at 5:22:00 PM.    Final    VAS [place] ABI WITH/WO TBI  Result Date: 12/21/2022  LOWER EXTREMITY DOPPLER STUDY Patient Name:  Edward Campos  Date of Exam:   12/21/2022 Medical Rec #: [id number]       Accession #:    [id number] Date of Birth: 07/09/1932        Patient Gender: M Patient Age:   89 years Exam Location:  [facility] Procedure:      VAS [place] ABI WITH/WO TBI Referring Phys: Heath Lark --------------------------------------------------------------------------------  Indications: Peripheral artery disease. History of excision bypass graft              femoral-femoral  and redo fem-femo graft using cryo saphenous vein              10/25/22.  Limitations: Today's exam was limited due to involuntary patient movement and .  Edward Campos ZOX:[id number] DOB: 1932-04-02 DOA: 12/17/2022  PCP: Patient, No Pcp Per  Admit date: 12/17/2022  Discharge date: 12/24/2022  Admitted From: SNF   Disposition:  SNF - palliative care   Recommendations for Outpatient Follow-up:   Follow up with PCP in 1-2 weeks  PCP Please obtain BMP/CBC, 2 view CXR in 1week,  (see Discharge instructions)   PCP Please follow up on the following pending results: If clinical condition improves then outpatient oncology follow-up, if clinical condition declines then focus on comfort measures and hospice.   Home Health: None Equipment/Devices: None  Consultations: Pall.Care Discharge Condition: Stable    CODE STATUS: Full    Diet Recommendation: Heart Healthy, nectar thick diet with feeding assistance and aspiration precautions    Chief Complaint  Patient presents with   Altered Mental Status     Brief history of present illness from the day of admission and additional interim summary     89 y.o. male with medical history significant of AAA, anxiety, hyperlipidemia, peripheral vascular disease, small cell lung carcinoma, atrial flutter and more presented to the ED with a chief complaint of altered mental status.  He is from a nursing facility and staff there reported onset of shortness of breath over the last few days.                                                                   Hospital Course   Acute metabolic encephalopathy due to sepsis from community-acquired pneumonia/aspiration pneumonia with acute hypoxic respiratory failure in the presence of left small cell cancer.  Apparently at baseline patient has normal mentation.  Head CT unremarkable, no focal deficits, continue supportive care, minimize narcotics and benzodiazepines.  Overall quite frail,  underlying non-small cell lung cancer, now encephalopathy with hospital-acquired delirium, prognosis does not look good, gradual but definite decline on a daily basis.  Palliative care on board, made DNR, if decline continues then may have to transition him to comfort measures, discussed with patient's daughter in detail on 12/22/2022.     Sepsis from community-acquired pneumonia/aspiration pneumonia with acute hypoxic respiratory failure in the presence of left small cell cancer.      Pneumonia could be due to microaspiration versus postobstructive pneumonia from underlying small cell cancer, mildly elevated CRP and procalcitonin, with empiric antibiotics improving, therapy following microaspiration confirmed, discussed with daughter, seen by palliative care this admission, for now continue full code and present line of care, encouraged to sit in chair use I-S and flutter valve for pulmonary toiletry.  Supportive care his pneumonia has improved but continues to have metabolic encephalopathy with some hospital-acquired delirium.  He was seen by speech therapy currently on nectar thick liquids and regular consistency, with empiric antibiotics pneumonia has improved, continue azithromycin for 3 more days at  Edward Campos ZOX:[id number] DOB: 1932-04-02 DOA: 12/17/2022  PCP: Patient, No Pcp Per  Admit date: 12/17/2022  Discharge date: 12/24/2022  Admitted From: SNF   Disposition:  SNF - palliative care   Recommendations for Outpatient Follow-up:   Follow up with PCP in 1-2 weeks  PCP Please obtain BMP/CBC, 2 view CXR in 1week,  (see Discharge instructions)   PCP Please follow up on the following pending results: If clinical condition improves then outpatient oncology follow-up, if clinical condition declines then focus on comfort measures and hospice.   Home Health: None Equipment/Devices: None  Consultations: Pall.Care Discharge Condition: Stable    CODE STATUS: Full    Diet Recommendation: Heart Healthy, nectar thick diet with feeding assistance and aspiration precautions    Chief Complaint  Patient presents with   Altered Mental Status     Brief history of present illness from the day of admission and additional interim summary     89 y.o. male with medical history significant of AAA, anxiety, hyperlipidemia, peripheral vascular disease, small cell lung carcinoma, atrial flutter and more presented to the ED with a chief complaint of altered mental status.  He is from a nursing facility and staff there reported onset of shortness of breath over the last few days.                                                                   Hospital Course   Acute metabolic encephalopathy due to sepsis from community-acquired pneumonia/aspiration pneumonia with acute hypoxic respiratory failure in the presence of left small cell cancer.  Apparently at baseline patient has normal mentation.  Head CT unremarkable, no focal deficits, continue supportive care, minimize narcotics and benzodiazepines.  Overall quite frail,  underlying non-small cell lung cancer, now encephalopathy with hospital-acquired delirium, prognosis does not look good, gradual but definite decline on a daily basis.  Palliative care on board, made DNR, if decline continues then may have to transition him to comfort measures, discussed with patient's daughter in detail on 12/22/2022.     Sepsis from community-acquired pneumonia/aspiration pneumonia with acute hypoxic respiratory failure in the presence of left small cell cancer.      Pneumonia could be due to microaspiration versus postobstructive pneumonia from underlying small cell cancer, mildly elevated CRP and procalcitonin, with empiric antibiotics improving, therapy following microaspiration confirmed, discussed with daughter, seen by palliative care this admission, for now continue full code and present line of care, encouraged to sit in chair use I-S and flutter valve for pulmonary toiletry.  Supportive care his pneumonia has improved but continues to have metabolic encephalopathy with some hospital-acquired delirium.  He was seen by speech therapy currently on nectar thick liquids and regular consistency, with empiric antibiotics pneumonia has improved, continue azithromycin for 3 more days at  Not able to obtain pressures on the right side and left PTA. Performing Technologist: Marilynne Halsted RDMS, RVT  Examination Guidelines: A complete evaluation includes at minimum, Doppler waveform signals and systolic blood pressure reading at the level of bilateral brachial, anterior tibial, and posterior tibial arteries, when vessel segments are accessible. Bilateral testing is considered an integral part of a complete examination. Photoelectric Plethysmograph (PPG) waveforms and toe systolic pressure readings are included as required and additional duplex testing as needed. Limited examinations for reoccurring indications may be performed as noted.  ABI Findings: +--------+------------------+-----+---------+--------+ Right   Rt Pressure (mmHg)IndexWaveform Comment  +--------+------------------+-----+---------+--------+ [id number]                    triphasic         +--------+------------------+-----+---------+--------+ +---------+------------------+-----+----------+---------------------------+ Left     Lt Pressure (mmHg)IndexWaveform  Comment                     +---------+------------------+-----+----------+---------------------------+ Brachial 127                    biphasic                              +---------+------------------+-----+----------+---------------------------+ PERO                            monophasicNot able to obtain pressure +---------+------------------+-----+----------+---------------------------+ DP       83                0.57 monophasic                            +---------+------------------+-----+----------+---------------------------+ Great Toe71                0.49 Abnormal                               +---------+------------------+-----+----------+---------------------------+ +-------+-----------+-----------+------------+------------+ ABI/TBIToday's ABIToday's TBIPrevious ABIPrevious TBI +-------+-----------+-----------+------------+------------+ Right                        0.82        0.45         +-------+-----------+-----------+------------+------------+ Left   0.57       0.49       0.83        0.69         +-------+-----------+-----------+------------+------------+  Summary: Right: Incomplete study on the right side due to patient constant movement. Left: Resting left ankle-brachial index indicates moderate left lower extremity arterial disease. The left toe-brachial index is abnormal. *See table(s) above for measurements and observations.  Electronically signed by Carolynn Sayers on 12/21/2022 at 5:21:43 PM.    Final    DG Swallowing Func-Speech Pathology  Result Date: 12/19/2022 Table formatting from the original result was not included. Modified Barium Swallow Study Patient Details Name: Edward Campos MRN: [id number] Date of Birth: [birth year] Today's Date: 12/19/2022 HPI/PMH: HPI: Edward Campos is a 89 y.o. male who presented the ED with a chief complaint of altered mental status. CXR 10/7: "diffuse bilateral interstitial pulmonary opacity, consistent with edema or atypical/viral infection."  Head CT 10/7 with no acute findings. Pt with medical history significant of AAA, anxiety, hyperlipidemia, peripheral vascular disease, small cell lung carcinoma, atrial flutter and more. Clinical Impression: Clinical Impression: Mr. Cournoyer  CT 09/24/2022. FINDINGS: The cardiomediastinal silhouette is unchanged. Aortic atherosclerosis. A known left perihilar/left lower lobe lung mass was better appreciated on the prior chest CT of 09/24/2022. Patchy airspace disease with a lower lobe predominance, similar to slightly improved as compared to the prior chest radiographs of 12/22/2022. Persistent mild background prominence of the interstitial lung markings suggesting a component of interstitial edema. Silhouette and of the left hemidiaphragm with blunting of the left lateral costophrenic angle. A small left pleural effusion is suspected. No evidence of pneumothorax. IMPRESSION: 1. A known left perihilar/left lower lobe lung mass was better appreciated on the prior chest CT of 09/24/2022. 2. Patchy bilateral airspace disease with a lower lobe predominance, likely reflecting pneumonia. These findings are similar to slightly improved as compared to the prior chest radiographs of 12/22/2022. 3. Persistent mild background prominence of the interstitial lung markings suggesting a component of interstitial edema. 4. Probable small left pleural effusion. 5. Aortic Atherosclerosis (ICD10-I70.0). Electronically Signed   By: Jackey Loge D.O.   On: 12/24/2022 07:25   DG Chest Port 1 View  Result Date: 12/22/2022 CLINICAL DATA:  Shortness of breath EXAM: PORTABLE CHEST - 1 VIEW COMPARISON:  12/19/2022 FINDINGS: Hazy poorly marginated airspace opacities throughout both lungs with a predominantly perihilar distribution, left greater than right. Left hemidiaphragm is obscured. Heart size and mediastinal contours are within normal limits. Aortic Atherosclerosis (ICD10-170.0). No definite effusion. Visualized bones unremarkable. IMPRESSION: Bilateral perihilar airspace disease, left greater than right. Electronically Signed   By: Corlis Leak M.D.   On: 12/22/2022  08:47   VAS [place] FEMORAL-FEMORAL BYPASS GRAFT  Result Date: 12/21/2022 LOWER EXTREMITY ARTERIAL DUPLEX STUDY Patient Name:  Edward Campos  Date of Exam:   12/21/2022 Medical Rec #: [id number]       Accession #:    [id number] Date of Birth: 06/18/1932        Patient Gender: M Patient Age:   73 years Exam Location:  [facility] Procedure:      VAS [place] FEMORAL-FEMORAL BYPASS GRAFT Referring Phys: Heath Lark --------------------------------------------------------------------------------  Indications: Peripheral artery disease.  Vascular Interventions: 10/25/22 - Excision bypass graft femoral-femoral artery                         and redo femoral-femoral graft using cryo-saphenous                         vein. Current ABI:            Rt. Not obtainable. Left 0.57 Performing Technologist: [place] Sink Sturdivant RDMS, RVT  Examination Guidelines: A complete evaluation includes B-mode imaging, spectral Doppler, color Doppler, and power Doppler as needed of all accessible portions of each vessel. Bilateral testing is considered an integral part of a complete examination. Limited examinations for reoccurring indications may be performed as noted.  Fem Fem Graft: +------------------+--------+--------+--------+--------+                   PSV cm/sStenosisWaveformComments +------------------+--------+--------+--------+--------+ Inflow            167             biphasic         +------------------+--------+--------+--------+--------+ Prox anastomosis  172             biphasic         +------------------+--------+--------+--------+--------+ Proximal graft    117                              +------------------+--------+--------+--------+--------+  CT 09/24/2022. FINDINGS: The cardiomediastinal silhouette is unchanged. Aortic atherosclerosis. A known left perihilar/left lower lobe lung mass was better appreciated on the prior chest CT of 09/24/2022. Patchy airspace disease with a lower lobe predominance, similar to slightly improved as compared to the prior chest radiographs of 12/22/2022. Persistent mild background prominence of the interstitial lung markings suggesting a component of interstitial edema. Silhouette and of the left hemidiaphragm with blunting of the left lateral costophrenic angle. A small left pleural effusion is suspected. No evidence of pneumothorax. IMPRESSION: 1. A known left perihilar/left lower lobe lung mass was better appreciated on the prior chest CT of 09/24/2022. 2. Patchy bilateral airspace disease with a lower lobe predominance, likely reflecting pneumonia. These findings are similar to slightly improved as compared to the prior chest radiographs of 12/22/2022. 3. Persistent mild background prominence of the interstitial lung markings suggesting a component of interstitial edema. 4. Probable small left pleural effusion. 5. Aortic Atherosclerosis (ICD10-I70.0). Electronically Signed   By: Jackey Loge D.O.   On: 12/24/2022 07:25   DG Chest Port 1 View  Result Date: 12/22/2022 CLINICAL DATA:  Shortness of breath EXAM: PORTABLE CHEST - 1 VIEW COMPARISON:  12/19/2022 FINDINGS: Hazy poorly marginated airspace opacities throughout both lungs with a predominantly perihilar distribution, left greater than right. Left hemidiaphragm is obscured. Heart size and mediastinal contours are within normal limits. Aortic Atherosclerosis (ICD10-170.0). No definite effusion. Visualized bones unremarkable. IMPRESSION: Bilateral perihilar airspace disease, left greater than right. Electronically Signed   By: Corlis Leak M.D.   On: 12/22/2022  08:47   VAS [place] FEMORAL-FEMORAL BYPASS GRAFT  Result Date: 12/21/2022 LOWER EXTREMITY ARTERIAL DUPLEX STUDY Patient Name:  Edward Campos  Date of Exam:   12/21/2022 Medical Rec #: [id number]       Accession #:    [id number] Date of Birth: 06/18/1932        Patient Gender: M Patient Age:   73 years Exam Location:  [facility] Procedure:      VAS [place] FEMORAL-FEMORAL BYPASS GRAFT Referring Phys: Heath Lark --------------------------------------------------------------------------------  Indications: Peripheral artery disease.  Vascular Interventions: 10/25/22 - Excision bypass graft femoral-femoral artery                         and redo femoral-femoral graft using cryo-saphenous                         vein. Current ABI:            Rt. Not obtainable. Left 0.57 Performing Technologist: [place] Sink Sturdivant RDMS, RVT  Examination Guidelines: A complete evaluation includes B-mode imaging, spectral Doppler, color Doppler, and power Doppler as needed of all accessible portions of each vessel. Bilateral testing is considered an integral part of a complete examination. Limited examinations for reoccurring indications may be performed as noted.  Fem Fem Graft: +------------------+--------+--------+--------+--------+                   PSV cm/sStenosisWaveformComments +------------------+--------+--------+--------+--------+ Inflow            167             biphasic         +------------------+--------+--------+--------+--------+ Prox anastomosis  172             biphasic         +------------------+--------+--------+--------+--------+ Proximal graft    117                              +------------------+--------+--------+--------+--------+

## 2022-12-24 NOTE — Progress Notes (Signed)
Daily Progress Note   Patient Name: Edward Campos       Date: 12/24/2022 DOB: 02-Aug-1932  Age: 89 y.o. MRN#: [id number] Attending Physician: Leroy Sea, MD Primary Care Physician: Patient, No Pcp Per Admit Date: 12/17/2022  Reason for Consultation/Follow-up: Establishing goals of care  Patient Profile/HPI:  89 y.o. male  with past medical history of stage IV lung cancer with mets to bones -saw Dr. Ellin Saba in July with plans to start chemotherapy however has had multiple hospitalizations since then and has been unable to start, status post excision of infected femoral bypass has required extensive wound care, wounds are now healed, hyperlipidemia, atrial flutter, admitted on 12/17/2022 with pneumonia.  MBS showed silent trace aspiration of thin liquids. SLP consulted with recommendations for nectar thick liquids and regular diet.  Palliative medicine consulted for goals of care   Subjective: Chart reviewed including labs, progress notes, imaging from this and previous encounters.  Patient was sleeping, did not arouse easily.  Plan is to discharge today to SNF with outpatient Palliative following.    Review of Systems  Unable to perform ROS: Mental status change     Physical Exam Vitals and nursing note reviewed.  Constitutional:      Appearance: He is ill-appearing.  Skin:    General: Skin is warm and dry.     Coloration: Skin is pale.  Neurological:     Comments: Sleeping              Vital Signs: BP (!) 110/53 (BP Location: Right Arm)   Pulse 85   Temp 97.9 F (36.6 C) (Oral)   Resp 13   Ht 5\' 8"  (1.727 m)   Wt 72.6 kg   SpO2 98%   BMI 24.33 kg/m  SpO2: SpO2: 98 % O2 Device: O2 Device: Nasal Cannula O2 Flow Rate: O2 Flow Rate (L/min): 3 L/min  Intake/output  summary:  Intake/Output Summary (Last 24 hours) at 12/24/2022 1356 Last data filed at 12/24/2022 0400 Gross per 24 hour  Intake 355 ml  Output 400 ml  Net -45 ml   LBM: Last BM Date : 12/23/22 Baseline Weight: Weight: 72.6 kg Most recent weight: Weight: 72.6 kg       Palliative Assessment/Data: PPS: 30%      Patient Active Problem List   Diagnosis Date  Noted   Elevated troponin 12/18/2022   Acute respiratory failure with hypoxia (HCC) 12/18/2022   Acute metabolic encephalopathy 12/18/2022   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   MRSA bacteremia 10/27/2022   Sepsis due to methicillin resistant Staphylococcus aureus (MRSA) (HCC) 10/27/2022   Sepsis (HCC) 10/24/2022   Hypokalemia 10/24/2022   Transaminitis 10/24/2022   Hyponatremia 10/24/2022   AAA (abdominal aortic aneurysm) (HCC) 10/24/2022   PAD (peripheral artery disease) (HCC) 10/24/2022   Infection 10/22/2022   Shock (HCC) 09/25/2022   Pneumonia 09/06/2022   Small cell lung cancer, left lower lobe (HCC) 08/30/2022   Mass of left lung 08/21/2022   Infection of vascular bypass graft (HCC) 08/16/2022   Typical atrial flutter (HCC)    Non-healing left groin open wound, initial encounter 07/06/2019   Status post insertion of iliac artery stent 06/08/2019   Peripheral vascular disease (HCC) 06/08/2019    Palliative Care Assessment & Plan    Assessment/Recommendations/Plan  Code status changed to DNR Appreciate TOC coordinating outpatient Palliative to follow at SNF Continue current diet with acceptance of aspiration risk- plan for transition to Hospice at SNF if declines or when transitions to LTC   Code Status: DNR  Prognosis:  < 6 months  Discharge Planning: Skilled Nursing Facility for rehab with Palliative care service follow-up  Care plan was discussed with patient's daughter- Wynona Canes  Thank you for allowing the Palliative Medicine Team to assist in the care of this patient.  Total time:   30 minutes  Time includes:   Preparing to see the patient (e.g., review of tests) Obtaining and/or reviewing separately obtained history Performing a medically necessary appropriate examination and/or evaluation Counseling and educating the patient/family/caregiver Ordering medications, tests, or procedures Referring and communicating with other health care professionals (when not reported separately) Documenting clinical information in the electronic or other health record Independently interpreting results (not reported separately) and communicating results to the patient/family/caregiver Care coordination (not reported separately) Clinical documentation  Ocie Bob, AGNP-C Palliative Medicine   Please contact Palliative Medicine Team phone at [phone] for questions and concerns.

## 2022-12-24 NOTE — Progress Notes (Deleted)
Cardiology Clinic Note   Patient Name: Edward Campos Date of Encounter: 12/24/2022  Primary Care Provider:  Patient, No Pcp Per Primary Cardiologist:  Reatha Harps, MD  Patient Profile    ***  Past Medical History    Past Medical History:  Diagnosis Date   AAA (abdominal aortic aneurysm) (HCC)    Anxiety    Arthritis    BPH (benign prostatic hyperplasia)    Dizziness    Hyperlipidemia    PVD (peripheral vascular disease) (HCC)    S/p fem-fem bypass grafting 08/2022   SCLC (small cell lung carcinoma), left [facility])    Past Surgical History:  Procedure Laterality Date   ABDOMINAL AORTOGRAM W/LOWER EXTREMITY N/A 05/06/2019   Procedure: ABDOMINAL AORTOGRAM W/LOWER EXTREMITY;  Surgeon: Cephus Shelling, MD;  [facility]: [facility] INVASIVE CV LAB;  Service: Cardiovascular;  Laterality: N/A;   ABDOMINAL WOUND DEHISCENCE N/A 08/22/2022   Procedure: WASHOUT ABDOMEN WOUND AND VAC CHANGE;  Surgeon: Cephus Shelling, MD;  Location: [facility] OR;  Service: Vascular;  Laterality: N/A;   APPLICATION OF WOUND VAC  07/06/2019   Procedure: Application Of Wound Vac Left Groin;  Surgeon: Sherren Kerns, MD;  Location: [facility] OR;  Service: Vascular;;   APPLICATION OF WOUND VAC N/A 08/16/2022   Procedure: APPLICATION OF WOUND VAC;  Surgeon: [name], MD;  Location: [facility] OR;  Service: Vascular;  Laterality: N/A;   APPLICATION OF WOUND VAC N/A 10/31/2022   Procedure: APPLICATION OF WOUND VAC;  Surgeon: Leonie Douglas, MD;  Location: [facility] OR;  Service: Vascular;  Laterality: N/A;   BRONCHIAL BIOPSY  08/21/2022   Procedure: BRONCHIAL BIOPSIES;  Surgeon: Oretha Milch, MD;  Location: [facility] ENDOSCOPY;  Service: Cardiopulmonary;;   BRONCHIAL BRUSHINGS  08/21/2022   Procedure: BRONCHIAL BRUSHINGS;  Surgeon: Oretha Milch, MD;  Location: [facility] ENDOSCOPY;  Service: Cardiopulmonary;;   BRONCHIAL WASHINGS  08/21/2022   Procedure: BRONCHIAL WASHINGS;  Surgeon: Oretha Milch, MD;  Location: [facility] ENDOSCOPY;   Service: Cardiopulmonary;;   CARDIOVERSION N/A 11/03/2020   Procedure: CARDIOVERSION;  Surgeon: Thurmon Fair, MD;  Location: [facility] ENDOSCOPY;  Service: Cardiovascular;  Laterality: N/A;   ENDARTERECTOMY FEMORAL Bilateral 06/08/2019   Procedure: ENDARTERECTOMY RIGHT COMMON FEMORAL; ENDARTERECTOMY RIGHT EXTERNAL ILIAC; ENDARTERECTOMY LEFT COMMON FEMORAL; ENDARTERECTOMY LEFT EXTERNAL ILIAC;  Surgeon: Sherren Kerns, MD;  Location: [facility] OR;  Service: Vascular;  Laterality: Bilateral;   FEMORAL-FEMORAL BYPASS GRAFT Bilateral 06/08/2019   Procedure: BYPASS GRAFT FEMORAL-FEMORAL ARTERY;  Surgeon: Sherren Kerns, MD;  Location: [facility] OR;  Service: Vascular;  Laterality: Bilateral;   FEMORAL-FEMORAL BYPASS GRAFT Bilateral 10/25/2022   Procedure: EXCISION BYPASS GRAFT FEMORAL-FEMORAL ARTERY AND REDO FEMORAL-FEMORAL BYPASS GRAFT USING CRYO-SAPHENOUS VEIN 79CMLX3-6MMd;  Surgeon: Victorino Sparrow, MD;  Location: [facility] OR;  Service: Vascular;  Laterality: Bilateral;   GROIN DEBRIDEMENT Left 07/06/2019   Procedure: INCISION AND DRAINAGE OF LEFT GROIN;  Surgeon: Sherren Kerns, MD;  Location: [facility] OR;  Service: Vascular;  Laterality: Left;   growth removed from chest     HEMORRHOID SURGERY     HEMOSTASIS CONTROL  08/21/2022   Procedure: HEMOSTASIS CONTROL;  Surgeon: Oretha Milch, MD;  Location: [facility] ENDOSCOPY;  Service: Cardiopulmonary;;   HERNIA REPAIR  12 yrs ago   left inguinal hernia   I & D EXTREMITY Bilateral 10/31/2022   Procedure: BILATERAL GROIN WASHOUT AND MUSCLE FLAPS;  Surgeon: Leonie Douglas, MD;  Location: [facility] OR;  Service: Vascular;  Laterality: Bilateral;   INCISE AND  DRAIN ABCESS Left 07/06/2019   Incision and drainage left groin placement of VAC dressing   INCISION AND DRAINAGE OF WOUND Right 08/16/2022   Procedure: IRRIGATION AND DEBRIDEMENT WOUND;  Surgeon: [name], MD;  Location: [facility] OR;  Service: Vascular;  Laterality: Right;   INGUINAL HERNIA REPAIR  10/16/2010   Procedure:  HERNIA REPAIR INGUINAL ADULT;  Surgeon: Dalia Heading;  Location: [facility] ORS;  Service: General;  Laterality: Right;   INSERTION OF ILIAC STENT Right 06/08/2019   Procedure: INSERTION OF RIGHT COMMON ILIAC AND EXTERNAL ILICAC STENT;  Surgeon: Sherren Kerns, MD;  Location: [facility] OR;  Service: Vascular;  Laterality: Right;   PATCH ANGIOPLASTY Right 06/08/2019   Procedure: Patch Angioplasty Right Common Femoral;  Surgeon: Sherren Kerns, MD;  Location: [facility] OR;  Service: Vascular;  Laterality: Right;   TEE WITHOUT CARDIOVERSION N/A 11/07/2022   Procedure: TRANSESOPHAGEAL ECHOCARDIOGRAM;  Surgeon: Jodelle Red, MD;  Location: [facility] INVASIVE CV LAB;  Service: Cardiovascular;  Laterality: N/A;   VIDEO BRONCHOSCOPY N/A 08/21/2022   Procedure: VIDEO BRONCHOSCOPY WITH FLUORO;  Surgeon: Oretha Milch, MD;  Location: [facility] ENDOSCOPY;  Service: Cardiopulmonary;  Laterality: N/A;    Allergies  Allergies  Allergen Reactions   Penicillins Other (See Comments)    History of Present Illness    ***  Home Medications    Prior to Admission medications   Medication Sig Start Date End Date Taking? Authorizing Provider  acetaminophen (TYLENOL) 325 MG tablet Take 650 mg by mouth every 4 (four) hours as needed for moderate pain.    [provider]  Amino Acids-Protein Hydrolys (PRO-STAT PO) Take 30 mLs by mouth in the morning and at bedtime.    [provider]  aspirin EC 81 MG tablet Take 1 tablet (81 mg total) by mouth daily. Swallow whole. Patient taking differently: Take 81 mg by mouth once. 06/30/21   O'NealRonnald Ramp, MD  Cholecalciferol (VITAMIN D-3) 125 MCG (5000 UT) TABS Take by mouth.    [provider]  clopidogrel (PLAVIX) 75 MG tablet Take 1 tablet (75 mg total) by mouth daily. 11/13/22   Rai, Delene Ruffini, MD  diltiazem (CARDIZEM CD) 180 MG 24 hr capsule Take 180 mg by mouth daily.    [provider]  fluticasone (FLONASE) 50 MCG/ACT nasal spray Place 2  sprays into both nostrils daily. 11/13/22   Rai, Delene Ruffini, MD  loratadine (CLARITIN) 10 MG tablet Take 10 mg by mouth daily.    [provider]  Magnesium Hydroxide (MILK OF MAGNESIA PO) Take 30 mLs by mouth daily.    [provider]  metoprolol tartrate (LOPRESSOR) 25 MG tablet Take 25 mg by mouth 2 (two) times daily.    [provider]  Multiple Vitamins-Minerals (MULTIVITAMIN WITH MINERALS) tablet Take 1 tablet by mouth daily.    [provider]  pantoprazole (PROTONIX) 40 MG tablet Take 1 tablet (40 mg total) by mouth daily. 11/13/22   Rai, Ripudeep K, MD  rosuvastatin (CRESTOR) 20 MG tablet TAKE 1 TABLET(20 MG) BY MOUTH DAILY Patient taking differently: Take 20 mg by mouth daily. 07/09/22   [name], MD  senna-docusate (SENOKOT-S) 8.6-50 MG tablet Take 1 tablet by mouth at bedtime as needed for mild constipation. 11/12/22   Rai, Ripudeep K, MD  sodium chloride 1 g tablet Take 1 tablet (1 g total) by mouth 3 (three) times daily with meals. 11/12/22   Cathren Harsh, MD    Family History  Family History  Problem Relation Age of Onset   Stroke Mother    Pseudochol deficiency Neg Hx    Malignant hyperthermia Neg Hx    Hypotension Neg Hx    Anesthesia problems Neg Hx    He indicated that his mother is deceased. He indicated that his father is deceased. He indicated that the status of his neg hx is unknown.  Social History    Social History   Socioeconomic History   Marital status: Divorced    Spouse name: Not on file   Number of children: 3   Years of education: Not on file   Highest education level: Not on file  Occupational History   Not on file  Tobacco Use   Smoking status: Former    Current packs/day: 0.00    Average packs/day: 1.5 packs/day for 70.0 years (105.0 ttl pk-yrs)    Types: Cigarettes    Start date: 05/29/1949    Quit date: 05/30/2019    Years since quitting: 3.5   Smokeless tobacco: Never  Vaping Use    Vaping status: Never Used  Substance and Sexual Activity   Alcohol use: Not Currently    Alcohol/week: 3.0 standard drinks of alcohol    Types: 3 Shots of liquor per week    Comment: 3 2oz. shots of bourbon/day   Drug use: No   Sexual activity: Not on file  Other Topics Concern   Not on file  Social History Narrative   Not on file   Social Determinants of Health   Financial Resource Strain: Not on file  Food Insecurity: No Food Insecurity (10/25/2022)   Hunger Vital Sign    Worried About Running Out of Food in the Last Year: Never true    Ran Out of Food in the Last Year: Never true  Transportation Needs: No Transportation Needs (10/25/2022)   PRAPARE - Administrator, Civil Service (Medical): No    Lack of Transportation (Non-Medical): No  Physical Activity: Not on file  Stress: Not on file  Social Connections: Not on file  Intimate Partner Violence: Not At Risk (10/25/2022)   Humiliation, Afraid, Rape, and Kick questionnaire    Fear of Current or Ex-Partner: No    Emotionally Abused: No    Physically Abused: No    Sexually Abused: No     Review of Systems    General:  No chills, fever, night sweats or weight changes.  Cardiovascular:  No chest pain, dyspnea on exertion, edema, orthopnea, palpitations, paroxysmal nocturnal dyspnea. Dermatological: No rash, lesions/masses Respiratory: No cough, dyspnea Urologic: No hematuria, dysuria Abdominal:   No nausea, vomiting, diarrhea, bright red blood per rectum, melena, or hematemesis Neurologic:  No visual changes, wkns, changes in mental status. All other systems reviewed and are otherwise negative except as noted above.  Physical Exam    VS:  There were no vitals taken for this visit. , BMI There is no height or weight on file to calculate BMI. GEN: Well nourished, well developed, in no acute distress. HEENT: normal. Neck: Supple, no JVD, carotid bruits, or masses. Cardiac: RRR, no murmurs, rubs, or gallops. No  clubbing, cyanosis, edema.  Radials/DP/PT 2+ and equal bilaterally.  Respiratory:  Respirations regular and unlabored, clear to auscultation bilaterally. GI: Soft, nontender, nondistended, BS + x 4. MS: no deformity or atrophy. Skin: warm and dry, no rash. Neuro:  Strength and sensation are intact. Psych: Normal affect.  Accessory Clinical Findings    Recent Labs: 10/29/2022: TSH  2.282 12/18/2022: ALT 16 12/23/2022: B Natriuretic Peptide 394.3; Magnesium 1.8 12/24/2022: BUN 21; Creatinine, Ser 1.15; Hemoglobin 9.9; Platelets 291; Potassium 3.7; Sodium 143   Recent Lipid Panel    Component Value Date/Time   CHOL 58 10/26/2022 0120   CHOL 105 10/27/2020 1038   TRIG 68 10/26/2022 0120   HDL 15 (L) 10/26/2022 0120   HDL 43 10/27/2020 1038   CHOLHDL 3.9 10/26/2022 0120   VLDL 14 10/26/2022 0120   LDLCALC 29 10/26/2022 0120   LDLCALC 50 10/27/2020 1038         ECG personally reviewed by me today- ***          Assessment & Plan   1.  ***   Thomasene Ripple. Kerria Sapien NP-C     12/24/2022, 8:40 AM [facility] [address] Office [phone] Fax [phone]    I spent***minutes examining this patient, reviewing medications, and using patient centered shared decision making involving her cardiac care.   I spent greater than 20 minutes reviewing her past medical history,  medications, and prior cardiac tests.

## 2022-12-24 NOTE — TOC Transition Note (Signed)
Transition of Care [facility]) - CM/SW Discharge Note   Patient Details  Name: Edward Campos MRN: [id number] Date of Birth: 1933-02-18  [facility]) CM/SW Contact:  Marliss Coots, LCSW Phone Number: 12/24/2022, 3:00 PM   Clinical Narrative:     Patient will DC to: [facility] [place] Anticipated DC date: 12/24/2022 Family notified: Yes [name], Daughter) Transport by: Sharin Mons   Per MD patient ready for DC to [facility] [place]. RN to call report prior to discharge [phone] Room 704). RN, patient, patient's family, and facility notified of DC. Discharge Summary and FL2 sent to facility. DC packet on chart, including signed DNR. Ambulance transport requested for patient.   CSW will sign off for now as social work intervention is no longer needed. Please consult [place] again if new needs arise.    Final next level of care: Skilled Nursing Facility Barriers to Discharge: Barriers Resolved   Patient Goals and CMS Choice CMS Medicare.gov Compare Post Acute Care list provided to:: Patient Represenative (must comment) Choice offered to / list presented to : Adult Children, HC POA / Guardian  Discharge Placement     Existing PASRR number confirmed : 12/24/22          Patient chooses bed at: [facility] [place] Patient to be transferred to facility by: [facility] Name of family member notified: Daughter Patient and family notified of of transfer: 12/24/22  Discharge Plan and Services Additional resources added to the After Visit Summary for   In-house Referral: Clinical Social Work   Post Acute Care Choice: Skilled Nursing Facility                               Social Determinants of Health (SDOH) Interventions SDOH Screenings   Food Insecurity: No Food Insecurity (10/25/2022)  Housing: Low Risk  (10/25/2022)  Transportation Needs: No Transportation Needs (10/25/2022)  Utilities: Not At Risk (10/25/2022)  Tobacco Use: Medium Risk (12/17/2022)     Readmission  Risk Interventions     No data to display

## 2022-12-24 NOTE — TOC Progression Note (Signed)
Transition of Care [facility]) - Progression Note    Patient Details  Name: Edward Campos MRN: [id number] Date of Birth: December 08, 1932  Transition of Care [facility]) CM/SW Contact  Marliss Coots, LCSW Phone Number: 12/24/2022, 12:53 PM  Clinical Narrative:     This CSW called patient's daughter, [name], to confirm patient's discharge today to [facility] via [facility]. Christine expressed understanding of the following information.  Expected Discharge Plan: Skilled Nursing Facility Barriers to Discharge: Barriers Resolved  Expected Discharge Plan and Services In-house Referral: Clinical Social Work   Post Acute Care Choice: Skilled Nursing Facility Living arrangements for the past 2 months: Single Family Home, Skilled Nursing Facility Expected Discharge Date: 12/24/22                                     Social Determinants of Health (SDOH) Interventions SDOH Screenings   Food Insecurity: No Food Insecurity (10/25/2022)  Housing: Low Risk  (10/25/2022)  Transportation Needs: No Transportation Needs (10/25/2022)  Utilities: Not At Risk (10/25/2022)  Tobacco Use: Medium Risk (12/17/2022)    Readmission Risk Interventions     No data to display

## 2022-12-24 NOTE — Progress Notes (Signed)
Pt refusing continuous pox. Keeps pulling off. Left off no orders in chart.

## 2022-12-24 NOTE — Progress Notes (Addendum)
  Bedside nurse reported that telemetry informed patient has 16 runs of V. tach.  Patient is hemodynamically stable.  However he seems confused and concern for hospital-acquired delirium.  Per chart review patient has admitted for management of acute metabolic encephalopathy in the setting of sepsis from community-acquired pneumonia.  Patient has also history of atrial flutter on diltiazem. -Continue to monitor.  No intervention at this time.  Addendum: -Patient is very confused and trying to get out of the bed.  Patient developed nonsustained PSVT when he was agitated. -Obtaining EKG. - Giving the lopressor and Cardizem morning dose now.  Tereasa Coop, MD [facility] 12/24/2022, 5:40 AM

## 2022-12-24 NOTE — Plan of Care (Addendum)
Pt is alert to oriented to self. Frequently agitated during overnight. Getting up multiple times thinking he has to leave this place that the post office is housing him. During aggitation pt had 13 beat of Vtach. Burst of PSVT nonsustained. Provider aware. Pt asymptomatic. Vitals stable. Tylenol given during overnight for back discomfort.  Problem: Health Behavior/Discharge Planning: Goal: Ability to manage health-related needs will improve Outcome: Not Progressing   Problem: Coping: Goal: Level of anxiety will decrease Outcome: Not Progressing   Problem: Elimination: Goal: Will not experience complications related to bowel motility Outcome: Not Progressing   Problem: Education: Goal: Knowledge of prescribed regimen will improve Outcome: Progressing   Problem: Activity: Goal: Ability to tolerate increased activity will improve Outcome: Progressing   Problem: Bowel/Gastric: Goal: Gastrointestinal status for postoperative course will improve Outcome: Progressing   Problem: Clinical Measurements: Goal: Postoperative complications will be avoided or minimized Outcome: Progressing Goal: Signs and symptoms of graft occlusion will improve Outcome: Progressing   Problem: Skin Integrity: Goal: Demonstration of wound healing without infection will improve Outcome: Progressing   Problem: Activity: Goal: Ability to tolerate increased activity will improve Outcome: Progressing   Problem: Clinical Measurements: Goal: Ability to maintain a body temperature in the normal range will improve Outcome: Progressing   Problem: Respiratory: Goal: Ability to maintain adequate ventilation will improve Outcome: Progressing Goal: Ability to maintain a clear airway will improve Outcome: Progressing   Problem: Education: Goal: Knowledge of General Education information will improve Description: Including pain rating scale, medication(s)/side effects and non-pharmacologic comfort  measures Outcome: Progressing   Problem: Clinical Measurements: Goal: Ability to maintain clinical measurements within normal limits will improve Outcome: Progressing Goal: Will remain free from infection Outcome: Progressing Goal: Diagnostic test results will improve Outcome: Progressing Goal: Respiratory complications will improve Outcome: Progressing Goal: Cardiovascular complication will be avoided Outcome: Progressing   Problem: Activity: Goal: Risk for activity intolerance will decrease Outcome: Progressing   Problem: Nutrition: Goal: Adequate nutrition will be maintained Outcome: Progressing   Problem: Elimination: Goal: Will not experience complications related to urinary retention Outcome: Progressing   Problem: Pain Managment: Goal: General experience of comfort will improve Outcome: Progressing   Problem: Safety: Goal: Ability to remain free from injury will improve Outcome: Progressing   Problem: Skin Integrity: Goal: Risk for impaired skin integrity will decrease Outcome: Progressing

## 2022-12-24 NOTE — Discharge Instructions (Signed)
Follow with Primary MD in 7 days   Get CBC, CMP, 2 view Chest X ray -  checked next visit with your primary MD or SNF MD    Activity: As tolerated with Full fall precautions use walker/cane & assistance as needed  Disposition SNF  Diet: Heart Healthy-nectar thick liquids with feeding assistance and aspiration precautions.    Special Instructions: If you have smoked or chewed Tobacco  in the last 2 yrs please stop smoking, stop any regular Alcohol  and or any Recreational drug use.  On your next visit with your primary care physician please Get Medicines reviewed and adjusted.  Please request your Prim.MD to go over all Hospital Tests and Procedure/Radiological results at the follow up, please get all Hospital records sent to your Prim MD by signing hospital release before you go home.  If you experience worsening of your admission symptoms, develop shortness of breath, life threatening emergency, suicidal or homicidal thoughts you must seek medical attention immediately by calling 911 or calling your MD immediately  if symptoms less severe.  You Must read complete instructions/literature along with all the possible adverse reactions/side effects for all the Medicines you take and that have been prescribed to you. Take any new Medicines after you have completely understood and accpet all the possible adverse reactions/side effects.   Do not drive when taking Pain medications.  Do not take more than prescribed Pain, Sleep and Anxiety Medications

## 2022-12-24 NOTE — Progress Notes (Signed)
Pt had 13 beats of vtach. 2 burst of psvt 140-170 nonsustained. On call MD aware of events associated with when pt get up and agitated thinking he needs to leave. Provider ordered haldol. RN informed provider no comfortable with giving haldol as ordered 2mg  due to frequent cardiac episodes. Provider d/c haldol. Order metoprolol then came to bedside and decided to d/c due to HR 70-80. EKG completed. Cardizem ordered and metoprolol for am moved up to now. Meds given.

## 2022-12-24 NOTE — Progress Notes (Signed)
Physical Therapy Treatment Patient Details Name: Edward Campos MRN: [id number] DOB: 23-Jul-1932 Today's Date: 12/24/2022   History of Present Illness Patient is a 89 year old male coming from nursing facility with acute metabolic encephalopathy, acute respiratory failure with hypoxia, community acquired pneumonia/sepsis.  Recent MRSA bacteremia and infected right femoral to femoral dacryon bypass status post excision. History of small cell lung cancer.    PT Comments  Pt received in supine, agreeable to therapy session with encouragement, with good participation and fair tolerance for seated/standing balance tasks. Pt sat EOB ~8 mins with mostly min to modA trunk support needed due to poor seated balance while unsupported and posterior/R sided lean. Pt needing up to modA for sit<>stand to RW and a few sidesteps along EOB to [facility]. Pt sits abruptly as he fatigued, SpO2 WFL on 3L O2 [place]. Pt bed positioned into upright chair posture and pt self-feeding his lunch at end of session, appears to be chewing thoroughly and no coughing when swallowing. Pt continues to benefit from PT services to progress toward functional mobility goals.    If plan is discharge home, recommend the following: A lot of help with bathing/dressing/bathroom;Assist for transportation;Help with stairs or ramp for entrance;Assistance with cooking/housework;Two people to help with walking and/or transfers;Supervision due to cognitive status;Direct supervision/assist for medications management (+2 gait)   Can travel by private vehicle     No  Equipment Recommendations  Other (comment) (to be determined at next level of care)    Recommendations for Other Services       Precautions / Restrictions Precautions Precautions: Fall Precaution Comments: aspiration risk Restrictions Weight Bearing Restrictions: No     Mobility  Bed Mobility Overal bed mobility: Needs Assistance Bed Mobility: Supine to Sit     Supine to sit: Mod  assist Sit to supine: Mod assist   General bed mobility comments: assistance for trunk, pt able to assist with moving his legs toward EOB, pulls up on bed side rail when cued to raise trunk partially. Posterior bias once up at EOB.    Transfers Overall transfer level: Needs assistance Equipment used: Rolling walker (2 wheels) Transfers: Sit to/from Stand, Bed to chair/wheelchair/BSC Sit to Stand: Mod assist   Step pivot transfers: Mod assist       General transfer comment: EOB<>RW and sidesteps along EOB toward HOB to simulate step pivot transfer. Cues for hand placement with standing. Posterior bias standing. Defer OOB to chair as per case mgmt, PTAR already called.    Ambulation/Gait               General Gait Details: not attempted due to fatigue with standing at bedside and sidesteps along EOB   Stairs             Wheelchair Mobility     Tilt Bed    Modified Rankin (Stroke Patients Only)       Balance Overall balance assessment: Needs assistance Sitting-balance support: Feet supported, Single extremity supported Sitting balance-Leahy Scale: Poor Sitting balance - Comments: posterior and R lean (toward HOB) and pt corrects with cues but needs consistent min to modA most of the session   Standing balance support: Single extremity supported Standing balance-Leahy Scale: Poor Standing balance comment: relying on external support of RW and PTA, pt sitting after ~1 minute of standing                            Cognition Arousal: Alert Behavior  During Therapy: WFL for tasks assessed/performed Overall Cognitive Status: Within Functional Limits for tasks assessed                                 General Comments: Pt needs increased time for processing, decreased safety awareness and insight into deficits. Participatory with encouragement and pleasant demeanor.        Exercises      General Comments General comments (skin  integrity, edema, etc.): SpO2 WFL on 3L O2 [place], HR to 118 bpm with standing and decreased to 70's-80's bpm in supine.      Pertinent Vitals/Pain Pain Assessment Pain Assessment: Faces Faces Pain Scale: Hurts even more Pain Location: lower back while sitting EOB Pain Descriptors / Indicators: Discomfort, Grimacing, Guarding Pain Intervention(s): Limited activity within patient's tolerance, Monitored during session, Repositioned     PT Goals (current goals can now be found in the care plan section) Acute Rehab PT Goals Patient Stated Goal: to feel better PT Goal Formulation: With patient Time For Goal Achievement: 12/26/22 Progress towards PT goals: Progressing toward goals    Frequency    Min 1X/week      PT Plan         AM-PAC PT "6 Clicks" Mobility   Outcome Measure  Help needed turning from your back to your side while in a flat bed without using bedrails?: A Lot Help needed moving from lying on your back to sitting on the side of a flat bed without using bedrails?: A Lot Help needed moving to and from a bed to a chair (including a wheelchair)?: A Lot Help needed standing up from a chair using your arms (e.g., wheelchair or bedside chair)?: A Lot Help needed to walk in hospital room?: Total Help needed climbing 3-5 steps with a railing? : Total 6 Click Score: 10    End of Session Equipment Utilized During Treatment: Gait belt;Oxygen Activity Tolerance: Patient limited by fatigue;Patient limited by pain;Other (comment) (c/o back pain while sitting) Patient left: in bed;with call bell/phone within reach;with bed alarm set (bed in chair posture, pt heels floated) Nurse Communication: Mobility status;Patient requests pain meds;Other (comment) (HR) PT Visit Diagnosis: Unsteadiness on feet (R26.81);Muscle weakness (generalized) (M62.81)     Time: 3329-5188 PT Time Calculation (min) (ACUTE ONLY): 17 min  Charges:    $Therapeutic Activity: 8-22 mins PT General  Charges $$ ACUTE PT VISIT: 1 Visit                     Edward Campos P., PTA Acute Rehabilitation Services Secure Chat Preferred 9a-5:30pm Office: [phone]    [facility] 12/24/2022, 5:15 PM

## 2022-12-25 DIAGNOSIS — J9621 Acute and chronic respiratory failure with hypoxia: Secondary | ICD-10-CM | POA: Diagnosis not present

## 2022-12-25 DIAGNOSIS — I5032 Chronic diastolic (congestive) heart failure: Secondary | ICD-10-CM | POA: Diagnosis not present

## 2022-12-25 DIAGNOSIS — I739 Peripheral vascular disease, unspecified: Secondary | ICD-10-CM | POA: Diagnosis not present

## 2022-12-25 DIAGNOSIS — J189 Pneumonia, unspecified organism: Secondary | ICD-10-CM | POA: Diagnosis not present

## 2022-12-27 ENCOUNTER — Ambulatory Visit: Payer: Medicare Other | Admitting: General Practice

## 2023-01-06 ENCOUNTER — Emergency Department (HOSPITAL_COMMUNITY): Payer: Medicare Other

## 2023-01-06 ENCOUNTER — Other Ambulatory Visit: Payer: Self-pay

## 2023-01-06 ENCOUNTER — Observation Stay (HOSPITAL_COMMUNITY)
Admission: EM | Admit: 2023-01-06 | Discharge: 2023-01-11 | Disposition: E | Payer: Medicare Other | Attending: Internal Medicine | Admitting: Internal Medicine

## 2023-01-06 ENCOUNTER — Encounter (HOSPITAL_COMMUNITY): Payer: Self-pay | Admitting: Internal Medicine

## 2023-01-06 DIAGNOSIS — Z87891 Personal history of nicotine dependence: Secondary | ICD-10-CM | POA: Insufficient documentation

## 2023-01-06 DIAGNOSIS — Z85828 Personal history of other malignant neoplasm of skin: Secondary | ICD-10-CM | POA: Diagnosis not present

## 2023-01-06 DIAGNOSIS — Z66 Do not resuscitate: Secondary | ICD-10-CM | POA: Diagnosis not present

## 2023-01-06 DIAGNOSIS — I483 Typical atrial flutter: Secondary | ICD-10-CM | POA: Diagnosis not present

## 2023-01-06 DIAGNOSIS — R0609 Other forms of dyspnea: Secondary | ICD-10-CM | POA: Diagnosis present

## 2023-01-06 DIAGNOSIS — E870 Hyperosmolality and hypernatremia: Secondary | ICD-10-CM

## 2023-01-06 DIAGNOSIS — Z79899 Other long term (current) drug therapy: Secondary | ICD-10-CM | POA: Insufficient documentation

## 2023-01-06 DIAGNOSIS — I4891 Unspecified atrial fibrillation: Secondary | ICD-10-CM

## 2023-01-06 DIAGNOSIS — Z7982 Long term (current) use of aspirin: Secondary | ICD-10-CM | POA: Diagnosis not present

## 2023-01-06 DIAGNOSIS — Z515 Encounter for palliative care: Secondary | ICD-10-CM | POA: Diagnosis not present

## 2023-01-06 DIAGNOSIS — Z789 Other specified health status: Secondary | ICD-10-CM | POA: Diagnosis not present

## 2023-01-06 DIAGNOSIS — C3432 Malignant neoplasm of lower lobe, left bronchus or lung: Secondary | ICD-10-CM | POA: Diagnosis not present

## 2023-01-06 DIAGNOSIS — J189 Pneumonia, unspecified organism: Secondary | ICD-10-CM | POA: Diagnosis not present

## 2023-01-06 DIAGNOSIS — J9601 Acute respiratory failure with hypoxia: Principal | ICD-10-CM | POA: Diagnosis present

## 2023-01-06 DIAGNOSIS — D649 Anemia, unspecified: Secondary | ICD-10-CM | POA: Diagnosis not present

## 2023-01-06 DIAGNOSIS — I739 Peripheral vascular disease, unspecified: Secondary | ICD-10-CM | POA: Diagnosis present

## 2023-01-06 DIAGNOSIS — E876 Hypokalemia: Secondary | ICD-10-CM

## 2023-01-06 DIAGNOSIS — R5383 Other fatigue: Secondary | ICD-10-CM | POA: Diagnosis not present

## 2023-01-06 DIAGNOSIS — Z7189 Other specified counseling: Secondary | ICD-10-CM | POA: Diagnosis not present

## 2023-01-06 DIAGNOSIS — R0603 Acute respiratory distress: Secondary | ICD-10-CM | POA: Diagnosis not present

## 2023-01-06 DIAGNOSIS — Z7902 Long term (current) use of antithrombotics/antiplatelets: Secondary | ICD-10-CM | POA: Diagnosis not present

## 2023-01-06 DIAGNOSIS — R0989 Other specified symptoms and signs involving the circulatory and respiratory systems: Secondary | ICD-10-CM | POA: Diagnosis not present

## 2023-01-06 DIAGNOSIS — C3430 Malignant neoplasm of lower lobe, unspecified bronchus or lung: Secondary | ICD-10-CM | POA: Diagnosis not present

## 2023-01-06 LAB — I-STAT CHEM 8, ED
BUN: 33 mg/dL — ABNORMAL HIGH (ref 8–23)
Calcium, Ion: 0.99 mmol/L — ABNORMAL LOW (ref 1.15–1.40)
Chloride: 127 mmol/L — ABNORMAL HIGH (ref 98–111)
Creatinine, Ser: 0.8 mg/dL (ref 0.61–1.24)
Glucose, Bld: 60 mg/dL — ABNORMAL LOW (ref 70–99)
HCT: 19 % — ABNORMAL LOW (ref 39.0–52.0)
Hemoglobin: 6.5 g/dL — CL (ref 13.0–17.0)
Potassium: 2.4 mmol/L — CL (ref 3.5–5.1)
Sodium: 161 mmol/L (ref 135–145)
TCO2: 16 mmol/L — ABNORMAL LOW (ref 22–32)

## 2023-01-06 LAB — CBC WITH DIFFERENTIAL/PLATELET
Abs Immature Granulocytes: 0.39 10*3/uL — ABNORMAL HIGH (ref 0.00–0.07)
Basophils Absolute: 0.1 10*3/uL (ref 0.0–0.1)
Basophils Relative: 0 %
Eosinophils Absolute: 0 10*3/uL (ref 0.0–0.5)
Eosinophils Relative: 0 %
HCT: 34.2 % — ABNORMAL LOW (ref 39.0–52.0)
Hemoglobin: 10.3 g/dL — ABNORMAL LOW (ref 13.0–17.0)
Immature Granulocytes: 2 %
Lymphocytes Relative: 3 %
Lymphs Abs: 0.7 10*3/uL (ref 0.7–4.0)
MCH: 27.5 pg (ref 26.0–34.0)
MCHC: 30.1 g/dL (ref 30.0–36.0)
MCV: 91.2 fL (ref 80.0–100.0)
Monocytes Absolute: 1.2 10*3/uL — ABNORMAL HIGH (ref 0.1–1.0)
Monocytes Relative: 6 %
Neutro Abs: 17.7 10*3/uL — ABNORMAL HIGH (ref 1.7–7.7)
Neutrophils Relative %: 89 %
Platelets: 373 10*3/uL (ref 150–400)
RBC: 3.75 MIL/uL — ABNORMAL LOW (ref 4.22–5.81)
RDW: 18.3 % — ABNORMAL HIGH (ref 11.5–15.5)
WBC: 20 10*3/uL — ABNORMAL HIGH (ref 4.0–10.5)
nRBC: 0.1 % (ref 0.0–0.2)

## 2023-01-06 LAB — BASIC METABOLIC PANEL
Anion gap: 13 (ref 5–15)
BUN: 43 mg/dL — ABNORMAL HIGH (ref 8–23)
CO2: 24 mmol/L (ref 22–32)
Calcium: 9.9 mg/dL (ref 8.9–10.3)
Chloride: 118 mmol/L — ABNORMAL HIGH (ref 98–111)
Creatinine, Ser: 1.57 mg/dL — ABNORMAL HIGH (ref 0.61–1.24)
GFR, Estimated: 42 mL/min — ABNORMAL LOW (ref >60)
Glucose, Bld: 93 mg/dL (ref 70–99)
Potassium: 3.9 mmol/L (ref 3.5–5.1)
Sodium: 155 mmol/L — ABNORMAL HIGH (ref 135–145)

## 2023-01-06 LAB — I-STAT CG4 LACTIC ACID, ED: Lactic Acid, Venous: 2 mmol/L (ref 0.5–1.9)

## 2023-01-06 LAB — I-STAT VENOUS BLOOD GAS, ED
Acid-base deficit: 8 mmol/L — ABNORMAL HIGH (ref 0.0–2.0)
Bicarbonate: 16.1 mmol/L — ABNORMAL LOW (ref 20.0–28.0)
Calcium, Ion: 0.99 mmol/L — ABNORMAL LOW (ref 1.15–1.40)
HCT: 18 % — ABNORMAL LOW (ref 39.0–52.0)
Hemoglobin: 6.1 g/dL — CL (ref 13.0–17.0)
O2 Saturation: 71 %
Potassium: 2.4 mmol/L — CL (ref 3.5–5.1)
Sodium: 161 mmol/L (ref 135–145)
TCO2: 17 mmol/L — ABNORMAL LOW (ref 22–32)
pCO2, Ven: 26.7 mm[Hg] — ABNORMAL LOW (ref 44–60)
pH, Ven: 7.39 (ref 7.25–7.43)
pO2, Ven: 37 mm[Hg] (ref 32–45)

## 2023-01-06 LAB — TROPONIN I (HIGH SENSITIVITY): Troponin I (High Sensitivity): 98 ng/L — ABNORMAL HIGH (ref <18)

## 2023-01-06 LAB — BRAIN NATRIURETIC PEPTIDE: B Natriuretic Peptide: 264.8 pg/mL — ABNORMAL HIGH (ref 0.0–100.0)

## 2023-01-06 MED ORDER — GLYCOPYRROLATE 1 MG PO TABS
1.0000 mg | ORAL_TABLET | ORAL | Status: DC | PRN
Start: 2023-01-06 — End: 2023-01-07

## 2023-01-06 MED ORDER — HYDROMORPHONE BOLUS VIA INFUSION: 0.2500 mg | INTRAVENOUS | Status: DC | PRN

## 2023-01-06 MED ORDER — HALOPERIDOL 0.5 MG PO TABS: 0.5000 mg | ORAL_TABLET | ORAL | Status: DC | PRN

## 2023-01-06 MED ORDER — SODIUM CHLORIDE 0.9% FLUSH
3.0000 mL | Freq: Two times a day (BID) | INTRAVENOUS | Status: DC
  Administered 2023-01-06: 3 mL via INTRAVENOUS

## 2023-01-06 MED ORDER — ALBUTEROL SULFATE (2.5 MG/3ML) 0.083% IN NEBU: 2.5000 mg | INHALATION_SOLUTION | RESPIRATORY_TRACT | Status: DC | PRN

## 2023-01-06 MED ORDER — HALOPERIDOL LACTATE 2 MG/ML PO CONC: 0.5000 mg | ORAL | Status: DC | PRN

## 2023-01-06 MED ORDER — SODIUM CHLORIDE 0.9 % IV SOLN: 250.0000 mL | INTRAVENOUS | Status: DC

## 2023-01-06 MED ORDER — ACETAMINOPHEN 650 MG RE SUPP: 650.0000 mg | Freq: Four times a day (QID) | RECTAL | Status: DC | PRN

## 2023-01-06 MED ORDER — ACETAMINOPHEN 325 MG PO TABS: 650.0000 mg | ORAL_TABLET | Freq: Four times a day (QID) | ORAL | Status: DC | PRN

## 2023-01-06 MED ORDER — GLYCOPYRROLATE 0.2 MG/ML IJ SOLN: 0.2000 mg | INTRAMUSCULAR | Status: DC | PRN

## 2023-01-06 MED ORDER — LORAZEPAM 1 MG PO TABS: 1.0000 mg | ORAL_TABLET | ORAL | Status: DC | PRN

## 2023-01-06 MED ORDER — ONDANSETRON HCL 4 MG/2ML IJ SOLN: 4.0000 mg | Freq: Four times a day (QID) | INTRAMUSCULAR | Status: DC | PRN

## 2023-01-06 MED ORDER — HYDROMORPHONE HCL-NACL 50-0.9 MG/50ML-% IV SOLN
0.5000 mg/h | INTRAVENOUS | Status: DC
Start: 2023-01-06 — End: 2023-01-07
  Administered 2023-01-06: 1 mg/h via INTRAVENOUS
  Filled 2023-01-06: qty 50

## 2023-01-06 MED ORDER — HYDROMORPHONE HCL 1 MG/ML IJ SOLN
1.0000 mg | Freq: Once | INTRAMUSCULAR | Status: AC
  Administered 2023-01-06: 1 mg via INTRAVENOUS
  Filled 2023-01-06: qty 1

## 2023-01-06 MED ORDER — HALOPERIDOL LACTATE 5 MG/ML IJ SOLN
2.0000 mg | Freq: Four times a day (QID) | INTRAMUSCULAR | Status: DC | PRN
Start: 2023-01-06 — End: 2023-01-07

## 2023-01-06 MED ORDER — MAGIC MOUTHWASH: 15.0000 mL | Freq: Four times a day (QID) | ORAL | Status: DC | PRN

## 2023-01-06 MED ORDER — VANCOMYCIN HCL 1500 MG/300ML IV SOLN
1500.0000 mg | Freq: Once | INTRAVENOUS | Status: DC
  Filled 2023-01-06: qty 300

## 2023-01-06 MED ORDER — LORAZEPAM 2 MG/ML PO CONC: 1.0000 mg | ORAL | Status: DC | PRN

## 2023-01-06 MED ORDER — POLYVINYL ALCOHOL 1.4 % OP SOLN: 1.0000 [drp] | Freq: Four times a day (QID) | OPHTHALMIC | Status: DC | PRN

## 2023-01-06 MED ORDER — LORAZEPAM 2 MG/ML IJ SOLN: 1.0000 mg | INTRAMUSCULAR | Status: DC | PRN

## 2023-01-06 MED ORDER — HALOPERIDOL 1 MG PO TABS
2.0000 mg | ORAL_TABLET | Freq: Four times a day (QID) | ORAL | Status: DC | PRN
Start: 2023-01-06 — End: 2023-01-07

## 2023-01-06 MED ORDER — HALOPERIDOL LACTATE 5 MG/ML IJ SOLN: 0.5000 mg | INTRAMUSCULAR | Status: DC | PRN

## 2023-01-06 MED ORDER — ONDANSETRON 4 MG PO TBDP: 4.0000 mg | ORAL_TABLET | Freq: Four times a day (QID) | ORAL | Status: DC | PRN

## 2023-01-06 MED ORDER — HYDROMORPHONE BOLUS VIA INFUSION: 0.5000 mg | INTRAVENOUS | Status: DC | PRN

## 2023-01-06 MED ORDER — IPRATROPIUM-ALBUTEROL 0.5-2.5 (3) MG/3ML IN SOLN
3.0000 mL | Freq: Once | RESPIRATORY_TRACT | Status: AC
  Administered 2023-01-06: 3 mL via RESPIRATORY_TRACT
  Filled 2023-01-06: qty 3

## 2023-01-06 MED ORDER — NOREPINEPHRINE 4 MG/250ML-% IV SOLN
2.0000 ug/min | INTRAVENOUS | Status: DC
  Administered 2023-01-06: 2 ug/min via INTRAVENOUS
  Filled 2023-01-06: qty 250

## 2023-01-06 MED ORDER — PANTOPRAZOLE SODIUM 40 MG PO TBEC: 40.0000 mg | DELAYED_RELEASE_TABLET | Freq: Every day | ORAL | Status: DC

## 2023-01-06 MED ORDER — SODIUM CHLORIDE 0.9 % IV BOLUS
500.0000 mL | Freq: Once | INTRAVENOUS | Status: AC
Start: 2023-01-06 — End: 2023-01-06
  Administered 2023-01-06: 500 mL via INTRAVENOUS

## 2023-01-06 MED ORDER — MELATONIN 3 MG PO TABS: 6.0000 mg | ORAL_TABLET | Freq: Every day | ORAL | Status: DC

## 2023-01-06 MED ORDER — SODIUM CHLORIDE 0.9 % IV SOLN: 2.0000 g | Freq: Once | INTRAVENOUS | Status: DC

## 2023-01-06 MED ORDER — HALOPERIDOL LACTATE 2 MG/ML PO CONC
2.0000 mg | Freq: Four times a day (QID) | ORAL | Status: DC | PRN
Start: 2023-01-06 — End: 2023-01-07

## 2023-01-06 MED ORDER — BIOTENE DRY MOUTH MT LIQD
15.0000 mL | Freq: Two times a day (BID) | OROMUCOSAL | Status: DC
  Administered 2023-01-06: 15 mL via OROMUCOSAL

## 2023-01-07 NOTE — Progress Notes (Signed)
Patient pronounced deceased on 01/17/23 at 23:57 by Argentina Ponder, RN & Shanon Rosser, RN. DO Julian Reil made aware at 00:18. Patient's family at the bedside. [facility] [employer] notified of patient's death and left message. Patient placement will follow up with family. No patient belongings brought into the hospital. Remainder of the dilaudid gtt wasted with another RN. Patient cleaned, bagged, and taken down to the morgue along with the help of NT.

## 2023-01-11 ENCOUNTER — Ambulatory Visit: Payer: Medicare Other | Admitting: Nurse Practitioner

## 2023-01-11 NOTE — ED Provider Notes (Signed)
Edward Campos EMERGENCY DEPARTMENT AT [facility] Provider Note   CSN: [id number] Arrival date & time:   1055     History  Chief Complaint  Patient presents with   Respiratory Distress   Hypotension    Edward Campos is a 89 y.o. male history of small cell lung carcinoma not currently on chemo or radiation, AAA, PVD, PAD, pneumonia, a flutter, respiratory failure with hypoxia presented with respiratory distress.  Patient was unable to give history as he cannot speak in full sentences however EMS state that last night began getting short of breath after being discharged a few days ago for pneumonia.  I called the daughter and daughter is unsure of what antibiotics he is on but states that he is on antibiotics.  Patient's oxygen with EMS ranged from the 70s to the 80s and so he was placed on 10 L nonrebreather which is new as he is not normally on oxygen.  EMS and daughter state that he has not had any fevers but daughter does state that he gets short of breath when moving around.  Daughter states he is normally able to speak and speak his mind.  Daughter does state that he is DNR and does not want to have an intubation done if needed and wants him to remain on the rebreather but does want labs and imaging.  Home Medications Prior to Admission medications   Medication Sig Start Date End Date Taking? Authorizing Provider  acetaminophen (TYLENOL) 325 MG tablet Take 650 mg by mouth every 4 (four) hours as needed for moderate pain.   Yes [provider]  albuterol (VENTOLIN HFA) 108 (90 Base) MCG/ACT inhaler Inhale 2 puffs into the lungs every 6 (six) hours as needed for wheezing or shortness of breath. 12/24/22  Yes Leroy Sea, MD  Amino Acids-Protein Hydrolys (PRO-STAT PO) Take 30 mLs by mouth in the morning and at bedtime.   Yes [provider]  aspirin EC 81 MG tablet Take 1 tablet (81 mg total) by mouth daily. Swallow whole. Patient taking differently:  Take 81 mg by mouth daily. 06/30/21  Yes O'Neal, Ronnald Ramp, MD  Cholecalciferol (VITAMIN D-3) 125 MCG (5000 UT) TABS Take by mouth.   Yes [provider]  clopidogrel (PLAVIX) 75 MG tablet Take 1 tablet (75 mg total) by mouth daily. 11/13/22  Yes Rai, Ripudeep K, MD  diltiazem (CARDIZEM CD) 180 MG 24 hr capsule Take 180 mg by mouth daily.   Yes [provider]  fluticasone (FLONASE) 50 MCG/ACT nasal spray Place 2 sprays into both nostrils daily. 11/13/22  Yes Rai, Ripudeep K, MD  ipratropium-albuterol (DUONEB) 0.5-2.5 (3) MG/3ML SOLN Take 3 mLs by nebulization 3 (three) times daily. 12/31/22 01/07/23 Yes [provider]  loperamide (IMODIUM A-D) 2 MG tablet Take 2 mg by mouth every 6 (six) hours as needed for diarrhea or loose stools.   Yes [provider]  loratadine (CLARITIN) 10 MG tablet Take 10 mg by mouth daily.   Yes [provider]  Magnesium Hydroxide (MILK OF MAGNESIA PO) Take 30 mLs by mouth daily as needed (constipation).   Yes [provider]  melatonin 3 MG TABS tablet Take 6 mg by mouth at bedtime.   Yes [provider]  metoprolol tartrate (LOPRESSOR) 25 MG tablet Take 25 mg by mouth 2 (two) times daily.   Yes [provider]  Multiple Vitamins-Minerals (MULTIVITAMIN WITH MINERALS) tablet Take 1 tablet by mouth daily.   Yes [provider]  Nutritional Supplements (BOOST VHC PO) Take 1 each by mouth in the morning and at bedtime.   Yes [provider]  OXYGEN Inhale 3 L into the lungs daily as needed (keep O2 sat above 90%).   Yes [provider]  pantoprazole (PROTONIX) 40 MG tablet Take 1 tablet (40 mg total) by mouth daily. 11/13/22  Yes Rai, Ripudeep K, MD  potassium chloride SA (KLOR-CON M) 20 MEQ tablet Take 20 mEq by mouth daily.   Yes [provider]  rosuvastatin (CRESTOR) 20 MG tablet TAKE 1 TABLET(20 MG) BY MOUTH DAILY Patient taking differently: Take 20 mg by mouth  daily. 07/09/22  Yes [name], MD  senna (SENOKOT) 8.6 MG TABS tablet Take 2 tablets by mouth at bedtime.   Yes [provider]  sodium chloride 1 g tablet Take 1 tablet (1 g total) by mouth 3 (three) times daily with meals. 11/12/22  Yes Rai, Ripudeep K, MD      Allergies    Penicillins    Review of Systems   Review of Systems Unable to obtain due to respiratory distress Physical Exam Updated Vital Signs BP 100/77   Pulse 63   Temp 99.4 F (37.4 C) (Tympanic)   Resp (!) 31   SpO2 94%  Physical Exam Constitutional:      General: He is in acute distress.     Comments: Unable to speak due to respiratory distress  Cardiovascular:     Rate and Rhythm: Tachycardia present. Rhythm irregular.     Heart sounds: Normal heart sounds.  Pulmonary:     Effort: Respiratory distress present.     Breath sounds: Wheezing present.  Abdominal:     Palpations: Abdomen is soft.     Tenderness: There is no abdominal tenderness. There is no guarding or rebound.  Musculoskeletal:        General: Normal range of motion.     Cervical back: Normal range of motion.     Right lower leg: No edema.     Left lower leg: No edema.  Skin:    General: Skin is warm and dry.     Capillary Refill: Capillary refill takes less than 2 seconds.  Neurological:     Mental Status: He is alert.     ED Results / Procedures / Treatments   Labs (all labs ordered are listed, but only abnormal results are displayed) Labs Reviewed  BASIC METABOLIC PANEL - Abnormal; Notable for the following components:      Result Value   Sodium 155 (*)    Chloride 118 (*)    BUN 43 (*)    Creatinine, Ser 1.57 (*)    GFR, Estimated 42 (*)    All other components within normal limits  CBC WITH DIFFERENTIAL/PLATELET - Abnormal; Notable for the following components:   WBC 20.0 (*)    RBC 3.75 (*)    Hemoglobin 10.3 (*)    HCT 34.2 (*)    RDW 18.3 (*)    Neutro Abs 17.7 (*)    Monocytes Absolute 1.2 (*)     Abs Immature Granulocytes 0.39 (*)    All other components within normal limits  BRAIN NATRIURETIC PEPTIDE - Abnormal; Notable for the following components:   B Natriuretic Peptide 264.8 (*)    All other components within normal limits  I-STAT CHEM 8, ED - Abnormal; Notable for the following components:   Sodium 161 (*)    Potassium 2.4 (*)  Chloride 127 (*)    BUN 33 (*)    Glucose, Bld 60 (*)    Calcium, Ion 0.99 (*)    TCO2 16 (*)    Hemoglobin 6.5 (*)    HCT 19.0 (*)    All other components within normal limits  I-STAT CG4 LACTIC ACID, ED - Abnormal; Notable for the following components:   Lactic Acid, Venous 2.0 (*)    All other components within normal limits  I-STAT VENOUS BLOOD GAS, ED - Abnormal; Notable for the following components:   pCO2, Ven 26.7 (*)    Bicarbonate 16.1 (*)    TCO2 17 (*)    Acid-base deficit 8.0 (*)    Sodium 161 (*)    Potassium 2.4 (*)    Calcium, Ion 0.99 (*)    HCT 18.0 (*)    Hemoglobin 6.1 (*)    All other components within normal limits  TROPONIN I (HIGH SENSITIVITY) - Abnormal; Notable for the following components:   Troponin I (High Sensitivity) 98 (*)    All other components within normal limits    EKG EKG Interpretation Date/Time:  Sunday January 06 2023 11:09:25 EDT Ventricular Rate:  119 PR Interval:    QRS Duration:  93 QT Interval:  297 QTC Calculation: 418 R Axis:   68  Text Interpretation: Atrial fibrillation Ventricular premature complex Repol abnrm suggests ischemia, lateral leads Confirmed by Kristine Royal [phone]) on  12:57:27 PM  Radiology DG Chest Port 1 View  Result Date:  CLINICAL DATA:  Respiratory distress, lethargy, history of left lower lobe small cell lung cancer EXAM: PORTABLE CHEST 1 VIEW COMPARISON:  12/24/2022, 09/24/2022 FINDINGS: Single frontal view of the chest demonstrates a stable cardiac silhouette. Since the prior exam, veiling density has developed throughout the left  chest, likely representing left lower lobe consolidation a patient with a known history of left hilar mass. Underlying effusion cannot be excluded. Diffuse interstitial prominence and pulmonary vascular congestion may reflect superimposed fluid overload. No pneumothorax. No acute fracture. IMPRESSION: 1. Veiling opacity throughout the left chest, consistent with progression of disease and near complete consolidation of the left lower lobe in a patient with a known history of left hilar mass and small cell lung cancer. 2. Pulmonary vascular congestion increased interstitial prominence since prior study, likely representing superimposed volume overload. Electronically Signed   By: Sharlet Salina M.D.   On:  11:50    Procedures .Critical Care  Performed by: Netta Corrigan, PA-C Authorized by: Netta Corrigan, PA-C   Critical care provider statement:    Critical care time (minutes):  50   Critical care time was exclusive of:  Separately billable procedures and treating other patients   Critical care was necessary to treat or prevent imminent or life-threatening deterioration of the following conditions:  Respiratory failure, sepsis and shock   Critical care was time spent personally by me on the following activities:  Blood draw for specimens, development of treatment plan with patient or surrogate, discussions with consultants, evaluation of patient's response to treatment, examination of patient, obtaining history from patient or surrogate, review of old charts, re-evaluation of patient's condition, pulse oximetry, ordering and review of radiographic studies, ordering and review of laboratory studies and ordering and performing treatments and interventions   I assumed direction of critical care for this patient from another provider in my specialty: no     Care discussed with: admitting provider       Medications Ordered in ED Medications  0.9 %  sodium chloride infusion (has no  administration in time range)  pantoprazole (PROTONIX) EC tablet 40 mg (has no administration in time range)  melatonin tablet 6 mg (has no administration in time range)  sodium chloride flush (NS) 0.9 % injection 3 mL (has no administration in time range)  ipratropium-albuterol (DUONEB) 0.5-2.5 (3) MG/3ML nebulizer solution 3 mL (3 mLs Nebulization Given  1139)  sodium chloride 0.9 % bolus 500 mL (0 mLs Intravenous Stopped  1247)    ED Course/ Medical Decision Making/ A&P                                 Medical Decision Making Amount and/or Complexity of Data Reviewed Labs: ordered. Radiology: ordered.  Risk Prescription drug management. Decision regarding hospitalization.   Revanth Pokorski 89 y.o. presented today for shortness of breath.  Working DDx that I considered at this time includes, but not limited to, asthma/COPD exacerbation, URI, viral illness, anemia, ACS, PE, pneumonia, pleural effusion, lung cancer.  R/o DDx: asthma/COPD exacerbation, URI, viral illness, ACS, pleural effusion: These are considered less likely due to history of present illness, physical exam, labs/imaging findings  Review of prior external notes: 12/17/2022 discharge  Unique Tests and My Interpretation:  CBC: Leukocytosis 20 BMP: Hyponatremia 155, AKI BNP: 264.8 EKG: A-fib RVR 119 bpm, PVCs noted, no ST elevations or depressions noted Troponin: 98 CXR: Progression of lung cancer in the left lung with possible vascular congestion overload Lactic acid: 2.0 Type and screen: Pending  Social Determinants of Health: none  Discussion with Independent Historian:  Daughter  Discussion of Management of Tests:  Intensivist ; Alinda Money, MD Hospitalist  Risk: High: hospitalization or escalation of hospital-level care  Risk Stratification Score: none  Staffed with Messick, MD  Plan: On exam patient was in acute respiratory distress with hypoxia on exam and hypotensive.  Patient was  recently discharged for pneumonia and is taking unknown antibiotics as per the daughter.  Patient was unable to provide history as he was in respiratory distress and so lab work was initiated.  Sepsis protocol was also initiated as patient had recent pneumonia and is now in respiratory distress and so suspect pneumonia may be contributing to symptoms.  Vanco and cefepime ordered broad-spectrum.  I spoke to the daughter and daughter states she still wants to have interventions done and however does not want intubation.  Patient's labs did come back significant for anemia of 6.5, hypernatremia 161, hypokalemia 2.4, elevated lactic acid with a leukocytosis of 20.  Patient does not appear fluid overloaded on exam however chest x-ray does show progression of lung cancer with possible fluid overload that could also be contributing to shortness of breath.  At this time there appears to be multiple factors contributing patient's respiratory distress including anemia, possible fluid overload along with his lung cancer.  Daughter denied any recent darker stools or blood in the stool however will obtain a occult card test.  Since daughter wants to have interventions done as patient was placed on Levophed ICU will be consulted for admission due to respiratory distress and Levophed drip.  Will obtain CTA to rule out blood clot.  I spoke to the ICU and they accepted patient for admission states they come down to evaluate the patient.  ICU spoke with the patient's family and daughter states that she wants comfort care at this time and so ICU recommends hospitalist admission.  Will consult  hospitalist.  Will order comfort measures and DC Levophed and blood cultures.  Hospitalist was consulted and accepted patient for admission.  Patient stable for admission at this time.  This chart was dictated using voice recognition software.  Despite best efforts to proofread,  errors can occur which can change the documentation  meaning.         Final Clinical Impression(s) / ED Diagnoses Final diagnoses:  Acute respiratory failure with hypoxia (HCC)  Symptomatic anemia  Hypernatremia  Hypokalemia  Atrial fibrillation with RVR [facility])    Rx / DC Orders ED Discharge Orders     None         Remi Deter  [id number]    Wynetta Fines, MD  1704

## 2023-01-11 NOTE — Discharge Summary (Signed)
Death Summary  Edward Campos:[id number] DOB: Jul 24, 1932 DOA: 2023-01-24  PCP: Patient, No Pcp Per PCP/Office notified: None, no PCP listed in chart.  Admit date: 01/24/2023 Date of Death: 2023/01/25  Final Diagnoses:  Principal Problem:   Acute respiratory failure with hypoxia (HCC) Active Problems:   Typical atrial flutter (HCC)   Small cell lung cancer, left lower lobe (HCC)   PAD (peripheral artery disease) (HCC)   Hypernatremia  HPI/Course  Acute respiratory failure with hypoxia Recurrent pneumonia Lung cancer Developing sepsis > Patient was recently admitted with a similar presentation and had suffered significant decline/deterioration.  Patient had been made DNR/DNI during that admission.  Plan was to see if patient improved outpatient per notes and if he did consider further treatment for lung cancer versus transition to comfort measures versus hospice. > Patient presented with respiratory failure with evidence of pneumonia and developing sepsis/shock and was disoriented.  Initially vasopressors were started and critical care was consulted.  Critical care discussed patient's goals of care with family and ultimately patient continue with DNR/DNI and was transition to comfort care. > Comfort care orders were placed and patient was placed on palliative unit.  There was an attempt made to get patient to hospice but no beds were available. > Per chart review patient passed overnight and nurse pronounced death on January 24, 2023 and 23: 57.  For coverage provider was notified on 01/25/2023 at 00: 18.  Date/Time: Date of death 01-24-23.  Time of death 23:57.  Signed:  Synetta Fail  [facility] 2023-01-25, 2:22 PM

## 2023-01-11 NOTE — Progress Notes (Signed)
ED Pharmacy Antibiotic Sign Off An antibiotic consult was received from an ED provider for Cefepime and Vancomycin per pharmacy dosing for pneumonia. A chart review was completed to assess appropriateness.   The following one time order(s) were placed:  Cefepime 2g IV x1 Vancomycin 1500mg  IV x1  Further antibiotic and/or antibiotic pharmacy consults should be ordered by the admitting provider if indicated.   Thank you for allowing pharmacy to be a part of this patient's care.   Wilburn Cornelia, PharmD, BCPS Clinical Pharmacist  11:37 AM   Please refer to [facility] for pharmacy phone number

## 2023-01-11 NOTE — IPAL (Signed)
Interdisciplinary Goals of Care Family Meeting   Date carried out::   Location of the meeting: Bedside  Member's involved: Physician, Bedside Registered Nurse, and Family Member or next of kin  Durable Power of Attorney or acting medical decision maker: Edward Campos  Discussion: We discussed goals of care for Edward Campos .    The Clinical status was relayed to daughters okay at bedside in detail.   Updated and notified of patients medical condition.   Explained to patient's daughter that he has advanced small cell lung cancer and with recurrent aspiration pneumonia, he is weak with multisystem organ failure. At this time he is suffering with severe pain, offered hospice care versus inpatient comfort care, patient's daughter stated she would like to proceed with inpatient comfort care and not do further aggressive lab test and treatment.  EDP was notified about patient's family wishes, will defer to hospitalist for admission for inpatient comfort care   Code status: Full DNR  Disposition: In-patient comfort care    Family are satisfied with Plan of action and management. All questions answered   Cheri Fowler MD [facility] See Amion for pager If no response to pager, please call [phone] until 7pm After 7pm, Please call E-link [phone]

## 2023-01-11 NOTE — H&P (Signed)
History and Physical   Edward Campos ZHY:[id number] DOB: 10-05-1932 DOA: 01-30-23  PCP: Patient, No Pcp Per   Patient coming from: Collapse  Chief Complaint: Respiratory distress/respiratory failure  HPI: Edward Campos is a 89 y.o. male with medical history significant of atrial flutter, PAD, lung cancer presenting with respiratory stress/respiratory failure.  Patient with known history of lung cancer and has now been experiencing recurrent aspiration pneumonia.  He was recently admitted earlier this month for encephalopathy, pneumonia presumably aspiration, sepsis.  Improved with treatment and was prescribed antibiotics on discharge.  While admitted he did remain frail and experience hospital delirium.  Palliative was involved during that admission and patient was made DNR.  Plan was to see how he did at nursing facility and if recovery may be consider treatment for lung cancer but if continued decline consider comfort measures.  Patient is hypoxic today.  Has had some worsening shortness of breath while at nursing facility.  EMS was called out and found to be 70 to 80% saturations and was requiring 8 to 10 L supplemental oxygen.  Patient transported to the ED.  Initial workup as below.  Ultimately critical care was consulted as below and discussed goals of care with patient's family will working on admitting him.  Family has decided to transition patient to comfort care as he already wished to be DNR/DNI and has continued to decline after recent admission and is not likely to improve.  No further labs or aggressive treatment.    Patient unable to participate in review of systems.  ED Course: Vital signs in the ED notable for blood pressure in the 70s to 100 systolic, heart rate in the 30s to 120s, respiratory in 20s.  Lab workup included BMP with sodium 155, chloride 118, BUN 43, creatinine 1.57.  CBC with leukocytosis to 20, hemoglobin stable at 10.3.  BNP 264.  Troponin 98.  Lactic acid  2.0.  FOBT, type and screen pending.  VBG with pH normal, pCO2 26.7.  Chest x-ray showed feeling opacity at the left chest near complete consolidation and also noted to be in history of hilar mass.  Also noted with pulmonary vascular congestion.  Patient had IV fluids, DuoNeb, antibiotics ordered in the ED.  All labs not obtained and medications not administered-discontinued as part of comfort care.  Critical care consulted for admission as above is patient was initially requiring vasopressors.  After conversation above this intervention along with others was discontinued and hospitalist asked to admit to palliative floor.  Review of Systems: Patient unable to participate in review of systems.  Past Medical History:  Diagnosis Date   AAA (abdominal aortic aneurysm) (HCC)    Anxiety    Arthritis    BPH (benign prostatic hyperplasia)    Dizziness    Hyperlipidemia    PVD (peripheral vascular disease) (HCC)    S/p fem-fem bypass grafting 08/2022   SCLC (small cell lung carcinoma), left [facility])     Past Surgical History:  Procedure Laterality Date   ABDOMINAL AORTOGRAM W/LOWER EXTREMITY N/A 05/06/2019   Procedure: ABDOMINAL AORTOGRAM W/LOWER EXTREMITY;  Surgeon: Cephus Shelling, MD;  [facility] INVASIVE CV LAB;  Service: Cardiovascular;  Laterality: N/A;   ABDOMINAL WOUND DEHISCENCE N/A 08/22/2022   Procedure: WASHOUT ABDOMEN WOUND AND VAC CHANGE;  Surgeon: Cephus Shelling, MD;  Location: [facility] OR;  Service: Vascular;  Laterality: N/A;   APPLICATION OF WOUND VAC  07/06/2019   Procedure: Application Of Wound Vac Left Groin;  Surgeon: Darrick Penna,  Janetta Hora, MD;  Location: [facility] OR;  Service: Vascular;;   APPLICATION OF WOUND VAC N/A 08/16/2022   Procedure: APPLICATION OF WOUND VAC;  Surgeon: [name], MD;  Location: [facility] OR;  Service: Vascular;  Laterality: N/A;   APPLICATION OF WOUND VAC N/A 10/31/2022   Procedure: APPLICATION OF WOUND VAC;  Surgeon: Leonie Douglas, MD;  Location: [facility]  OR;  Service: Vascular;  Laterality: N/A;   BRONCHIAL BIOPSY  08/21/2022   Procedure: BRONCHIAL BIOPSIES;  Surgeon: Oretha Milch, MD;  Location: [facility] ENDOSCOPY;  Service: Cardiopulmonary;;   BRONCHIAL BRUSHINGS  08/21/2022   Procedure: BRONCHIAL BRUSHINGS;  Surgeon: Oretha Milch, MD;  Location: [facility] ENDOSCOPY;  Service: Cardiopulmonary;;   BRONCHIAL WASHINGS  08/21/2022   Procedure: BRONCHIAL WASHINGS;  Surgeon: Oretha Milch, MD;  Location: [facility] ENDOSCOPY;  Service: Cardiopulmonary;;   CARDIOVERSION N/A 11/03/2020   Procedure: CARDIOVERSION;  Surgeon: Thurmon Fair, MD;  [facility]: [facility] ENDOSCOPY;  Service: Cardiovascular;  Laterality: N/A;   ENDARTERECTOMY FEMORAL Bilateral 06/08/2019   Procedure: ENDARTERECTOMY RIGHT COMMON FEMORAL; ENDARTERECTOMY RIGHT EXTERNAL ILIAC; ENDARTERECTOMY LEFT COMMON FEMORAL; ENDARTERECTOMY LEFT EXTERNAL ILIAC;  Surgeon: Sherren Kerns, MD;  Location: [facility] OR;  Service: Vascular;  Laterality: Bilateral;   FEMORAL-FEMORAL BYPASS GRAFT Bilateral 06/08/2019   Procedure: BYPASS GRAFT FEMORAL-FEMORAL ARTERY;  Surgeon: Sherren Kerns, MD;  Location: [facility] OR;  Service: Vascular;  Laterality: Bilateral;   FEMORAL-FEMORAL BYPASS GRAFT Bilateral 10/25/2022   Procedure: EXCISION BYPASS GRAFT FEMORAL-FEMORAL ARTERY AND REDO FEMORAL-FEMORAL BYPASS GRAFT USING CRYO-SAPHENOUS VEIN [id number];  Surgeon: Victorino Sparrow, MD;  Location: [facility] OR;  Service: Vascular;  Laterality: Bilateral;   GROIN DEBRIDEMENT Left 07/06/2019   Procedure: INCISION AND DRAINAGE OF LEFT GROIN;  Surgeon: Sherren Kerns, MD;  Location: [facility] OR;  Service: Vascular;  Laterality: Left;   growth removed from chest     HEMORRHOID SURGERY     HEMOSTASIS CONTROL  08/21/2022   Procedure: HEMOSTASIS CONTROL;  Surgeon: Oretha Milch, MD;  Location: [facility] ENDOSCOPY;  Service: Cardiopulmonary;;   HERNIA REPAIR  12 yrs ago   left inguinal hernia   I & D EXTREMITY Bilateral 10/31/2022   Procedure: BILATERAL GROIN WASHOUT AND  MUSCLE FLAPS;  Surgeon: Leonie Douglas, MD;  Location: [facility] OR;  Service: Vascular;  Laterality: Bilateral;   INCISE AND DRAIN ABCESS Left 07/06/2019   Incision and drainage left groin placement of VAC dressing   INCISION AND DRAINAGE OF WOUND Right 08/16/2022   Procedure: IRRIGATION AND DEBRIDEMENT WOUND;  Surgeon: [name], MD;  Location: [facility] OR;  Service: Vascular;  Laterality: Right;   INGUINAL HERNIA REPAIR  10/16/2010   Procedure: HERNIA REPAIR INGUINAL ADULT;  Surgeon: Dalia Heading;  Location: [facility] ORS;  Service: General;  Laterality: Right;   INSERTION OF ILIAC STENT Right 06/08/2019   Procedure: INSERTION OF RIGHT COMMON ILIAC AND EXTERNAL ILICAC STENT;  Surgeon: Sherren Kerns, MD;  Location: [facility] OR;  Service: Vascular;  Laterality: Right;   PATCH ANGIOPLASTY Right 06/08/2019   Procedure: Patch Angioplasty Right Common Femoral;  Surgeon: Sherren Kerns, MD;  Location: [facility] OR;  Service: Vascular;  Laterality: Right;   TEE WITHOUT CARDIOVERSION N/A 11/07/2022   Procedure: TRANSESOPHAGEAL ECHOCARDIOGRAM;  Surgeon: Jodelle Red, MD;  Location: [facility] INVASIVE CV LAB;  Service: Cardiovascular;  Laterality: N/A;   VIDEO BRONCHOSCOPY N/A 08/21/2022   Procedure: VIDEO BRONCHOSCOPY WITH FLUORO;  Surgeon: Oretha Milch, MD;  Location: [facility] ENDOSCOPY;  Service: Cardiopulmonary;  Laterality: N/A;  Social History  reports that he quit smoking about 3 years ago. His smoking use included cigarettes. He started smoking about 73 years ago. He has a 105 pack-year smoking history. He has never used smokeless tobacco. He reports that he does not currently use alcohol after a past usage of about 3.0 standard drinks of alcohol per week. He reports that he does not use drugs.  Allergies  Allergen Reactions   Penicillins Other (See Comments)    Family History  Problem Relation Age of Onset   Stroke Mother    Pseudochol deficiency Neg Hx    Malignant hyperthermia Neg Hx     Hypotension Neg Hx    Anesthesia problems Neg Hx   Reviewed on admission  Prior to Admission medications   Medication Sig Start Date End Date Taking? Authorizing Provider  acetaminophen (TYLENOL) 325 MG tablet Take 650 mg by mouth every 4 (four) hours as needed for moderate pain.   Yes [provider]  albuterol (VENTOLIN HFA) 108 (90 Base) MCG/ACT inhaler Inhale 2 puffs into the lungs every 6 (six) hours as needed for wheezing or shortness of breath. 12/24/22  Yes Leroy Sea, MD  Amino Acids-Protein Hydrolys (PRO-STAT PO) Take 30 mLs by mouth in the morning and at bedtime.   Yes [provider]  aspirin EC 81 MG tablet Take 1 tablet (81 mg total) by mouth daily. Swallow whole. Patient taking differently: Take 81 mg by mouth daily. 06/30/21  Yes O'Neal, Ronnald Ramp, MD  Cholecalciferol (VITAMIN D-3) 125 MCG (5000 UT) TABS Take by mouth.   Yes [provider]  clopidogrel (PLAVIX) 75 MG tablet Take 1 tablet (75 mg total) by mouth daily. 11/13/22  Yes Rai, Ripudeep K, MD  diltiazem (CARDIZEM CD) 180 MG 24 hr capsule Take 180 mg by mouth daily.   Yes [provider]  fluticasone (FLONASE) 50 MCG/ACT nasal spray Place 2 sprays into both nostrils daily. 11/13/22  Yes Rai, Ripudeep K, MD  ipratropium-albuterol (DUONEB) 0.5-2.5 (3) MG/3ML SOLN Take 3 mLs by nebulization 3 (three) times daily. 12/31/22 01/07/23 Yes [provider]  loperamide (IMODIUM A-D) 2 MG tablet Take 2 mg by mouth every 6 (six) hours as needed for diarrhea or loose stools.   Yes [provider]  loratadine (CLARITIN) 10 MG tablet Take 10 mg by mouth daily.   Yes [provider]  Magnesium Hydroxide (MILK OF MAGNESIA PO) Take 30 mLs by mouth daily as needed (constipation).   Yes [provider]  melatonin 3 MG TABS tablet Take 6 mg by mouth at bedtime.   Yes [provider]  metoprolol tartrate (LOPRESSOR) 25 MG tablet Take 25 mg by mouth 2 (two)  times daily.   Yes [provider]  Multiple Vitamins-Minerals (MULTIVITAMIN WITH MINERALS) tablet Take 1 tablet by mouth daily.   Yes [provider]  Nutritional Supplements (BOOST VHC PO) Take 1 each by mouth in the morning and at bedtime.   Yes [provider]  OXYGEN Inhale 3 L into the lungs daily as needed (keep O2 sat above 90%).   Yes [provider]  pantoprazole (PROTONIX) 40 MG tablet Take 1 tablet (40 mg total) by mouth daily. 11/13/22  Yes Rai, Ripudeep K, MD  potassium chloride SA (KLOR-CON M) 20 MEQ tablet Take 20 mEq by mouth daily.   Yes [provider]  rosuvastatin (CRESTOR) 20 MG tablet TAKE 1 TABLET(20 MG) BY MOUTH DAILY Patient taking differently: Take 20 mg by  mouth daily. 07/09/22  Yes [name], MD  senna (SENOKOT) 8.6 MG TABS tablet Take 2 tablets by mouth at bedtime.   Yes [provider]  sodium chloride 1 g tablet Take 1 tablet (1 g total) by mouth 3 (three) times daily with meals. 11/12/22  Yes Cathren Harsh, MD    Physical Exam: Vitals:   February 03, 2023 1115 2023/02/03 1145 02/03/23 1146 02-03-23 1215  BP: (!) 79/67 (!) 97/55 (!) 77/59 100/77  Pulse: (!) 31 (!) 121 (!) 38 63  Resp: (!) 22 (!) 29 (!) 32 (!) 31  Temp:      TempSrc:      SpO2:  98% 92% 94%    Physical Exam Constitutional:      General: He is not in acute distress.    Appearance: He is ill-appearing.  HENT:     Head: Normocephalic and atraumatic.     Mouth/Throat:     Mouth: Mucous membranes are moist.     Pharynx: Oropharynx is clear.  Eyes:     Extraocular Movements: Extraocular movements intact.     Pupils: Pupils are equal, round, and reactive to light.  Cardiovascular:     Rate and Rhythm: Regular rhythm. Tachycardia present.     Pulses: Normal pulses.     Heart sounds: Normal heart sounds.  Pulmonary:     Effort: Pulmonary effort is normal. No respiratory distress.     Breath sounds: Normal breath sounds.  Abdominal:      General: Bowel sounds are normal. There is no distension.     Palpations: Abdomen is soft.     Tenderness: There is no abdominal tenderness.  Musculoskeletal:        General: No swelling or deformity.  Skin:    General: Skin is warm and dry.  Neurological:     General: No focal deficit present.     Mental Status: Mental status is at baseline.    Labs on Admission: I have personally reviewed following labs and imaging studies  CBC: Recent Labs  Lab 02/03/2023 1111 02/03/2023 1135  WBC 20.0*  --   NEUTROABS 17.7*  --   HGB 10.3* 6.1*  6.5*  HCT 34.2* 18.0*  19.0*  MCV 91.2  --   PLT 373  --     Basic Metabolic Panel: Recent Labs  Lab 02-03-2023 1111 02/03/2023 1135  NA 155* 161*  161*  K 3.9 2.4*  2.4*  CL 118* 127*  CO2 24  --   GLUCOSE 93 60*  BUN 43* 33*  CREATININE 1.57* 0.80  CALCIUM 9.9  --     GFR: CrCl cannot be calculated (Unknown ideal weight.).  Liver Function Tests: No results for input(s): "AST", "ALT", "ALKPHOS", "BILITOT", "PROT", "ALBUMIN" in the last 168 hours.  Urine analysis:    Component Value Date/Time   COLORURINE YELLOW 12/18/2022 1020   APPEARANCEUR HAZY (A) 12/18/2022 1020   LABSPEC 1.025 12/18/2022 1020   PHURINE 6.0 12/18/2022 1020   GLUCOSEU NEGATIVE 12/18/2022 1020   HGBUR MODERATE (A) 12/18/2022 1020   BILIRUBINUR NEGATIVE 12/18/2022 1020   KETONESUR NEGATIVE 12/18/2022 1020   PROTEINUR NEGATIVE 12/18/2022 1020   NITRITE NEGATIVE 12/18/2022 1020   LEUKOCYTESUR NEGATIVE 12/18/2022 1020    Radiological Exams on Admission: DG Chest Port 1 View  Result Date: 02/03/23 CLINICAL DATA:  Respiratory distress, lethargy, history of left lower lobe small cell lung cancer EXAM: PORTABLE CHEST 1 VIEW COMPARISON:  12/24/2022, 09/24/2022 FINDINGS: Single frontal view of the  chest demonstrates a stable cardiac silhouette. Since the prior exam, veiling density has developed throughout the left chest, likely representing left lower lobe  consolidation a patient with a known history of left hilar mass. Underlying effusion cannot be excluded. Diffuse interstitial prominence and pulmonary vascular congestion may reflect superimposed fluid overload. No pneumothorax. No acute fracture. IMPRESSION: 1. Veiling opacity throughout the left chest, consistent with progression of disease and near complete consolidation of the left lower lobe in a patient with a known history of left hilar mass and small cell lung cancer. 2. Pulmonary vascular congestion increased interstitial prominence since prior study, likely representing superimposed volume overload. Electronically Signed   By: Sharlet Salina M.D.   On: 2023-01-14 11:50    EKG: Independently reviewed.  Atrial fibrillation at 119 bpm.  Nonspecific T wave changes.  Significant baseline wander.  Minimal baseline artifact.  Assessment/Plan Principal Problem:   Acute respiratory failure with hypoxia (HCC) Active Problems:   Typical atrial flutter (HCC)   Small cell lung cancer, left lower lobe (HCC)   PAD (peripheral artery disease) (HCC)   Hypernatremia   Respiratory failure Recurrent pneumonia Lung cancer > Known history of lung cancer and recurrent aspiration pneumonia.  Presenting with respiratory distress and respiratory failure. > Requiring 8 to 10 L by EMS and route. > Initially had antibiotics ordered for concern for recurrent pneumonia/sepsis which she was recently mated for earlier this month. > Vasopressors have been ordered and critical care consulted but after goals of care discussion between critical care and patient's family patient has been transition to comfort care which is his discussion which had been started prior to discharge during recent admission. - Initiate comfort measures - Discontinue antibiotics - Consult palliative medicine  Comfort care > As above patient is made comfort care due to continued significant decline and poor prognosis. - PRN medication for  pain, anxiety, agitation, secretions, nausea, dry eyes, mouth pain/irritation - Can continue with IV fluids as needed - Consult palliative medicine  Atrial flutter - Resume home diltiazem and/or metoprolol if tachycardia leads to discomfort.  PAD - Plavix now reordered given comfort status  DVT prophylaxis: None in the setting comfort care Code Status:   DNR/DNI, comfort measures Family Communication:  Updated at bedside Disposition Plan:   Patient is from:  [facility]  Anticipated DC to:  Home versus hospice versus in hospital death  Anticipated DC date:  1 to 3 days  Anticipated DC barriers: None  Consults called:  PCCM consulted in the ED and have left a note, signed off.  Consult to palliative medicine. Admission status:  Observation, palliative floor  Severity of Illness: The appropriate patient status for this patient is OBSERVATION. Observation status is judged to be reasonable and necessary in order to provide the required intensity of service to ensure the patient's safety. The patient's presenting symptoms, physical exam findings, and initial radiographic and laboratory data in the context of their medical condition is felt to place them at decreased risk for further clinical deterioration. Furthermore, it is anticipated that the patient will be medically stable for discharge from the hospital within 2 midnights of admission.   Synetta Fail MD [facility]  How to contact the [facility] Attending or Consulting provider 7A - 7P or covering provider during after hours 7P -7A, for this patient?   Check the care team in [facility] and look for a) attending/consulting [facility] provider listed and b) the [facility] team listed Log into [email or web address] and use [place]'s  universal password to access. If you do not have the password, please contact the hospital operator. Locate the [facility] provider you are looking for under [facility] and page to a number that you can be directly reached. If  you still have difficulty reaching the provider, please page the [facility] (Director on Call) for the Hospitalists listed on amion for assistance.  23-Jan-2023, 1:38 PM

## 2023-01-11 NOTE — Care Management (Signed)
Received a message from [facility] with Palliative care regarding daughters desire for residential hospice. They wish for [facility]. Called Elizabeth at [facility].. They do not have any beds at either facility at the present but will contact daughter.. Team aware of the situation.

## 2023-01-11 NOTE — Sepsis Progress Note (Signed)
Sepsis protocol is being followed by [facility]. 

## 2023-01-11 NOTE — H&P (Incomplete)
NAME:  Edward Campos, MRN:  [id number], DOB:  1933/02/16, LOS: 0 ADMISSION DATE:  Feb 05, 2023, CONSULTATION DATE:  Feb 05, 2023 REFERRING MD:  Dr. Rodena Medin - EDP , CHIEF COMPLAINT:  Hypoxia in the setting of PNA and SCLC   History of Present Illness:  Edward Campos is a 89 y.o. with a PMH significnt  for   Pertinent  Medical History  ***  Significant Hospital Events: Including procedures, antibiotic start and stop dates in addition to other pertinent events     Interim History / Subjective:  ***  Objective   Blood pressure (!) 77/59, pulse (!) 38, temperature 99.4 F (37.4 C), temperature source Tympanic, resp. rate (!) 32, SpO2 92%.       No intake or output data in the 24 hours ending Feb 05, 2023 1218 There were no vitals filed for this visit.  Examination: General: *** HENT: *** Lungs: *** Cardiovascular: *** Abdomen: *** Extremities: *** Neuro: *** GU: ***  Resolved Hospital Problem list   ***  Assessment & Plan:  ***  Best Practice (right click and "Reselect all SmartList Selections" daily)   Diet/type: {diet type:25684} DVT prophylaxis: {anticoagulation (Optional):25687} GI prophylaxis: {NW:29562} Lines: {Central Venous Access:25771} Foley:  {Central Venous Access:25691} Code Status:  {Code Status:26939} Last date of multidisciplinary goals of care discussion [***]  Labs   CBC: Recent Labs  Lab 02-05-2023 1111 2023-02-05 1135  WBC 20.0*  --   NEUTROABS 17.7*  --   HGB 10.3* 6.1*  6.5*  HCT 34.2* 18.0*  19.0*  MCV 91.2  --   PLT 373  --     Basic Metabolic Panel: Recent Labs  Lab 02/05/2023 1135  NA 161*  161*  K 2.4*  2.4*  CL 127*  GLUCOSE 60*  BUN 33*  CREATININE 0.80   GFR: CrCl cannot be calculated (Unknown ideal weight.). Recent Labs  Lab 2023/02/05 1111 05-Feb-2023 1136  WBC 20.0*  --   LATICACIDVEN  --  2.0*    Liver Function Tests: No results for input(s): "AST", "ALT", "ALKPHOS", "BILITOT", "PROT", "ALBUMIN" in the last 168  hours. No results for input(s): "LIPASE", "AMYLASE" in the last 168 hours. No results for input(s): "AMMONIA" in the last 168 hours.  ABG    Component Value Date/Time   PHART 7.402 10/31/2022 1715   PCO2ART 37.5 10/31/2022 1715   PO2ART 323 (H) 10/31/2022 1715   HCO3 16.1 (L) February 05, 2023 1135   TCO2 16 (L) Feb 05, 2023 1135   TCO2 17 (L) 02-05-2023 1135   ACIDBASEDEF 8.0 (H) Feb 05, 2023 1135   O2SAT 71 05-Feb-2023 1135     Coagulation Profile: No results for input(s): "INR", "PROTIME" in the last 168 hours.  Cardiac Enzymes: No results for input(s): "CKTOTAL", "CKMB", "CKMBINDEX", "TROPONINI" in the last 168 hours.  HbA1C: Hgb A1c MFr Bld  Date/Time Value Ref Range Status  05/29/2019 02:50 PM 5.0 4.8 - 5.6 % Final    Comment:             Prediabetes: 5.7 - 6.4          Diabetes: >6.4          Glycemic control for adults with diabetes: <7.0     CBG: No results for input(s): "GLUCAP" in the last 168 hours.  Review of Systems:   ***  Past Medical History:  He,  has a past medical history of AAA (abdominal aortic aneurysm) (HCC), Anxiety, Arthritis, BPH (benign prostatic hyperplasia), Dizziness, Hyperlipidemia, PVD (peripheral vascular disease) (HCC), and SCLC (small cell lung  carcinoma), left (HCC).   Surgical History:   Past Surgical History:  Procedure Laterality Date   ABDOMINAL AORTOGRAM W/LOWER EXTREMITY N/A 05/06/2019   Procedure: ABDOMINAL AORTOGRAM W/LOWER EXTREMITY;  Surgeon: Cephus Shelling, MD;  Location: [facility] INVASIVE CV LAB;  Service: Cardiovascular;  Laterality: N/A;   ABDOMINAL WOUND DEHISCENCE N/A 08/22/2022   Procedure: WASHOUT ABDOMEN WOUND AND VAC CHANGE;  Surgeon: Cephus Shelling, MD;  Location[facility] OR;  Service: Vascular;  Laterality: N/A;   APPLICATION OF WOUND VAC  07/06/2019   Procedure: Application Of Wound Vac Left Groin;  Surgeon: Sherren Kerns, MD;  Location: [facility] OR;  Service: Vascular;;   APPLICATION OF WOUND VAC N/A 08/16/2022    Procedure: APPLICATION OF WOUND VAC;  Surgeon: [name], MD;  Location: [facility] OR;  Service: Vascular;  Laterality: N/A;   APPLICATION OF WOUND VAC N/A 10/31/2022   Procedure: APPLICATION OF WOUND VAC;  Surgeon: Leonie Douglas, MD;  Location: [facility] OR;  Service: Vascular;  Laterality: N/A;   BRONCHIAL BIOPSY  08/21/2022   Procedure: BRONCHIAL BIOPSIES;  Surgeon: Oretha Milch, MD;  Location: [facility] ENDOSCOPY;  Service: Cardiopulmonary;;   BRONCHIAL BRUSHINGS  08/21/2022   Procedure: BRONCHIAL BRUSHINGS;  Surgeon: Oretha Milch, MD;  Location: [facility] ENDOSCOPY;  Service: Cardiopulmonary;;   BRONCHIAL WASHINGS  08/21/2022   Procedure: BRONCHIAL WASHINGS;  Surgeon: Oretha Milch, MD;  Location: [facility] ENDOSCOPY;  Service: Cardiopulmonary;;   CARDIOVERSION N/A 11/03/2020   Procedure: CARDIOVERSION;  Surgeon: Thurmon Fair, MD;  Location: [facility] ENDOSCOPY;  Service: Cardiovascular;  Laterality: N/A;   ENDARTERECTOMY FEMORAL Bilateral 06/08/2019   Procedure: ENDARTERECTOMY RIGHT COMMON FEMORAL; ENDARTERECTOMY RIGHT EXTERNAL ILIAC; ENDARTERECTOMY LEFT COMMON FEMORAL; ENDARTERECTOMY LEFT EXTERNAL ILIAC;  Surgeon: Sherren Kerns, MD;  Location: [facility] OR;  Service: Vascular;  Laterality: Bilateral;   FEMORAL-FEMORAL BYPASS GRAFT Bilateral 06/08/2019   Procedure: BYPASS GRAFT FEMORAL-FEMORAL ARTERY;  Surgeon: Sherren Kerns, MD;  Location: [facility] OR;  Service: Vascular;  Laterality: Bilateral;   FEMORAL-FEMORAL BYPASS GRAFT Bilateral 10/25/2022   Procedure: EXCISION BYPASS GRAFT FEMORAL-FEMORAL ARTERY AND REDO FEMORAL-FEMORAL BYPASS GRAFT USING CRYO-SAPHENOUS VEIN 79CMLX3-6MMd;  Surgeon: Victorino Sparrow, MD;  Location: [facility] OR;  Service: Vascular;  Laterality: Bilateral;   GROIN DEBRIDEMENT Left 07/06/2019   Procedure: INCISION AND DRAINAGE OF LEFT GROIN;  Surgeon: Sherren Kerns, MD;  Location: [facility] OR;  Service: Vascular;  Laterality: Left;   growth removed from chest     HEMORRHOID SURGERY     HEMOSTASIS CONTROL   08/21/2022   Procedure: HEMOSTASIS CONTROL;  Surgeon: Oretha Milch, MD;  Location: [facility] ENDOSCOPY;  Service: Cardiopulmonary;;   HERNIA REPAIR  12 yrs ago   left inguinal hernia   I & D EXTREMITY Bilateral 10/31/2022   Procedure: BILATERAL GROIN WASHOUT AND MUSCLE FLAPS;  Surgeon: Leonie Douglas, MD;  Location: [facility] OR;  Service: Vascular;  Laterality: Bilateral;   INCISE AND DRAIN ABCESS Left 07/06/2019   Incision and drainage left groin placement of VAC dressing   INCISION AND DRAINAGE OF WOUND Right 08/16/2022   Procedure: IRRIGATION AND DEBRIDEMENT WOUND;  Surgeon: [name], MD;  Location: [facility] OR;  Service: Vascular;  Laterality: Right;   INGUINAL HERNIA REPAIR  10/16/2010   Procedure: HERNIA REPAIR INGUINAL ADULT;  Surgeon: Dalia Heading;  Location: [facility] ORS;  Service: General;  Laterality: Right;   INSERTION OF ILIAC STENT Right 06/08/2019   Procedure: INSERTION OF RIGHT COMMON ILIAC AND EXTERNAL ILICAC STENT;  Surgeon: Darrick Penna,  Janetta Hora, MD;  Location: [facility] OR;  Service: Vascular;  Laterality: Right;   PATCH ANGIOPLASTY Right 06/08/2019   Procedure: Patch Angioplasty Right Common Femoral;  Surgeon: Sherren Kerns, MD;  Location: [facility] OR;  Service: Vascular;  Laterality: Right;   TEE WITHOUT CARDIOVERSION N/A 11/07/2022   Procedure: TRANSESOPHAGEAL ECHOCARDIOGRAM;  Surgeon: Jodelle Red, MD;  Location: [facility] INVASIVE CV LAB;  Service: Cardiovascular;  Laterality: N/A;   VIDEO BRONCHOSCOPY N/A 08/21/2022   Procedure: VIDEO BRONCHOSCOPY WITH FLUORO;  Surgeon: Oretha Milch, MD;  Location: [facility] ENDOSCOPY;  Service: Cardiopulmonary;  Laterality: N/A;     Social History:   reports that he quit smoking about 3 years ago. His smoking use included cigarettes. He started smoking about 73 years ago. He has a 105 pack-year smoking history. He has never used smokeless tobacco. He reports that he does not currently use alcohol after a past usage of about 3.0 standard drinks of alcohol per  week. He reports that he does not use drugs.   Family History:  His family history includes Stroke in his mother. There is no history of Pseudochol deficiency, Malignant hyperthermia, Hypotension, or Anesthesia problems.   Allergies Allergies  Allergen Reactions   Penicillins Other (See Comments)     Home Medications  Prior to Admission medications   Medication Sig Start Date End Date Taking? Authorizing Provider  acetaminophen (TYLENOL) 325 MG tablet Take 650 mg by mouth every 4 (four) hours as needed for moderate pain.   Yes [provider]  albuterol (VENTOLIN HFA) 108 (90 Base) MCG/ACT inhaler Inhale 2 puffs into the lungs every 6 (six) hours as needed for wheezing or shortness of breath. 12/24/22  Yes Leroy Sea, MD  Amino Acids-Protein Hydrolys (PRO-STAT PO) Take 30 mLs by mouth in the morning and at bedtime.   Yes [provider]  aspirin EC 81 MG tablet Take 1 tablet (81 mg total) by mouth daily. Swallow whole. Patient taking differently: Take 81 mg by mouth daily. 06/30/21  Yes O'Neal, Ronnald Ramp, MD  Cholecalciferol (VITAMIN D-3) 125 MCG (5000 UT) TABS Take by mouth.   Yes [provider]  clopidogrel (PLAVIX) 75 MG tablet Take 1 tablet (75 mg total) by mouth daily. 11/13/22  Yes Rai, Ripudeep K, MD  diltiazem (CARDIZEM CD) 180 MG 24 hr capsule Take 180 mg by mouth daily.   Yes [provider]  fluticasone (FLONASE) 50 MCG/ACT nasal spray Place 2 sprays into both nostrils daily. 11/13/22  Yes Rai, Ripudeep K, MD  ipratropium-albuterol (DUONEB) 0.5-2.5 (3) MG/3ML SOLN Take 3 mLs by nebulization 3 (three) times daily. 12/31/22 01/07/23 Yes [provider]  loperamide (IMODIUM A-D) 2 MG tablet Take 2 mg by mouth every 6 (six) hours as needed for diarrhea or loose stools.   Yes [provider]  loratadine (CLARITIN) 10 MG tablet Take 10 mg by mouth daily.   Yes [provider]  Magnesium Hydroxide (MILK OF MAGNESIA  PO) Take 30 mLs by mouth daily as needed (constipation).   Yes [provider]  melatonin 3 MG TABS tablet Take 6 mg by mouth at bedtime.   Yes [provider]  metoprolol tartrate (LOPRESSOR) 25 MG tablet Take 25 mg by mouth 2 (two) times daily.   Yes [provider]  metroNIDAZOLE (FLAGYL) 500 MG tablet Take 500 mg by mouth 3 (three) times daily. 12/31/22 01/07/23 Yes [provider]  Multiple Vitamins-Minerals (MULTIVITAMIN WITH MINERALS) tablet Take 1 tablet by mouth daily.  Yes [provider]  Nutritional Supplements (BOOST VHC PO) Take 1 each by mouth in the morning and at bedtime.   Yes [provider]  OXYGEN Inhale 3 L into the lungs daily as needed (keep O2 sat above 90%).   Yes [provider]  pantoprazole (PROTONIX) 40 MG tablet Take 1 tablet (40 mg total) by mouth daily. 11/13/22  Yes Rai, Ripudeep K, MD  potassium chloride SA (KLOR-CON M) 20 MEQ tablet Take 20 mEq by mouth daily.   Yes [provider]  rosuvastatin (CRESTOR) 20 MG tablet TAKE 1 TABLET(20 MG) BY MOUTH DAILY Patient taking differently: Take 20 mg by mouth daily. 07/09/22  Yes [name], MD  senna (SENOKOT) 8.6 MG TABS tablet Take 2 tablets by mouth at bedtime.   Yes [provider]  sodium chloride 1 g tablet Take 1 tablet (1 g total) by mouth 3 (three) times daily with meals. 11/12/22  Yes Rai, Delene Ruffini, MD     Critical care time: ***

## 2023-01-11 NOTE — ED Triage Notes (Signed)
Patient BIB coming from [facility], PNA x2 currently on ABT therapy, o2 stat dropped to 70-80's RA. Patient is not normally on o2. No h/o of respiratory issues. Non-re breather 10%. Hypotensive 86/47, 125, CBG 98, 20gRAC. H/O lung cancer without treatment.

## 2023-01-11 NOTE — Consult Note (Signed)
Consultation Note Date: Feb 03, 2023   Patient Name: Edward Campos  DOB: 1932-08-13  MRN: [id number]  Age / Sex: 89 y.o., male  PCP: Patient, No Pcp Per Referring Physician: Synetta Fail, MD  Reason for Consultation: Establishing goals of care, "Patient transitioned to comfort measures in the ED. "  HPI/Patient Profile: 89 y.o. male  with past medical history of small cell lung carcinoma not currently on chemo or radiation, AAA, PVD, PAD, pneumonia, a flutter, respiratory failure with hypoxia presented to ED on 2023-02-03 from [facility] with respiratory distress. After discussions with CCM, family opted for patient's transition to full comfort measures in ED.  Of note, he has had 4 inpatient admissions and 1 ED visit in 6 months. PMT was involved for GOC during his most recent hospitalization.   Clinical Assessment and Goals of Care: I have reviewed medical records including EPIC notes, labs, and imaging. Unable to receive report from primary RN.  Went to visit patient at bedside - two daughters, Edward/HCPOA and Edward Campos, present. Patient was lying in bed - he does not respond to voice or gentle touch. Signs of discomfort noted. Increased work of breathing noted; no secretions. Durable DNR form noted at bedside.  Met with two daughters in ED conference room  to discuss diagnosis, prognosis, GOC, EOL wishes, disposition, and options.  I re-introduced Palliative Medicine as specialized medical care for people living with serious illness. It focuses on providing relief from the symptoms and stress of a serious illness. The goal is to improve quality of life for both the patient and the family.  Emotional support provided to family. Daughters remember PMT involvement in patient's care from previous hospitalization.    We talked about comfort measures in house and what that would entail inclusive of medications to  control pain, dyspnea, agitation, nausea, and itching. We discussed stopping all unnecessary measures such as blood draws, needle sticks, oxygen, antibiotics, CBGs/insulin, cardiac monitoring, IVF, and frequent vital signs. Education provided that other non-pharmacological interventions would be utilized for holistic support and comfort such as spiritual support if requested, repositioning, music therapy, offering comfort feeds, and/or therapeutic listening. All care would focus on how the patient is looking and feeling. Family confirm goals for full comfort measures today.  Provided education and counseling at length on the philosophy and benefits of hospice care. Discussed that it offers a holistic approach to care in the setting of end-stage illness, and is about supporting the patient where they are allowing nature to take it's course. Discussed the hospice team includes RNs, physicians, social workers, and chaplains. They can provide personal care, support for the family, and help keep patient out of the hospital as well as assist with DME needs for home hospice. Education provided on the difference between home vs residential hospice. Provided reassurance that residential hospice referral could be cancelled (would anticipate hospital death) at any time if patient's condition changed and it was felt they were too unstable for transfer. Family is interested in patient's transfer to residential hospice facility - they are hopeful he doesn't have to die in the hospital. Family request [facility] as this is close to their home. Discussed possibility they do not have any beds - family are not open to other facilities - they prefer [facility].   Visit also consisted of discussions dealing with the complex and emotionally intense issues of symptom management and palliative care in the setting of serious and life-threatening illness.   Discussed with  family the importance of  continued conversation with each other and the medical providers regarding overall plan of care and treatment options, ensuring decisions are within the context of the patient's values and GOCs.    Questions and concerns were addressed. The patient/family was encouraged to call with questions and/or concerns. PMT card was provided.   Primary Decision Maker: HCPOA - daughter/Edward Campos    SUMMARY OF RECOMMENDATIONS   Continue full comfort measures Continue DNR/DNI as previously documented Family requesting patient's transfer to [facility] - TOC notified and consult placed. Per TOC they do not have any beds available today. Anticipate patient will not be stable for transfer tomorrow 10/28, but will re-assess  Added orders for EOL symptom management and to reflect full comfort measures, as well as discontinued orders that were not focused on comfort Unrestricted visitation orders were placed per current [facility] EOL visitation policy  Nursing to provide frequent assessments and administer PRN medications as clinically necessary to ensure EOL comfort PMT will continue to follow and support holistically  Symptom Management Continuous dilaudid infusion; bolus doses for breakthrough pain/dyspnea/increased work of breathing/RR>25 Tylenol PRN pain/fever Biotin twice daily Robinul PRN secretions Haldol PRN agitation/delirium Ativan PRN anxiety/seizure/sleep/distress Zofran PRN nausea/vomiting Liquifilm Tears PRN dry eye    Code Status/Advance Care Planning: DNR  Palliative Prophylaxis:  Aspiration, Delirium Protocol, Eye Care, Frequent Pain Assessment, Oral Care, and Turn Reposition  Additional Recommendations (Limitations, Scope, Preferences): Full Comfort Care  Psycho-social/Spiritual:  Desire for further Chaplaincy support:no Created space and opportunity for patient and family to express thoughts and feelings regarding patient's current medical situation.   Emotional support and therapeutic listening provided.  Prognosis:  Hours - Days  Discharge Planning: Hospice facility vs hospital death     Primary Diagnoses: Present on Admission:  Typical atrial flutter (HCC)  Small cell lung cancer, left lower lobe (HCC)  PAD (peripheral artery disease) (HCC)  Acute respiratory failure with hypoxia (HCC)   I have reviewed the medical record, interviewed the patient and family, and examined the patient. The following aspects are pertinent.  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   Social History   Socioeconomic History   Marital status: Divorced    Spouse name: Not on file   Number of children: 3   Years of education: Not on file   Highest education level: Not on file  Occupational History   Not on file  Tobacco Use   Smoking status: Former    Current packs/day: 0.00    Average packs/day: 1.5 packs/day for 70.0 years (105.0 ttl pk-yrs)    Types: Cigarettes    Start date: 05/29/1949    Quit date: 05/30/2019    Years since quitting: 3.6   Smokeless tobacco: Never  Vaping Use   Vaping status: Never Used  Substance and Sexual Activity   Alcohol use: Not Currently    Alcohol/week: 3.0 standard drinks of alcohol    Types: 3 Shots of liquor per week  Comment: 3 2oz. shots of bourbon/day   Drug use: No   Sexual activity: Not on file  Other Topics Concern   Not on file  Social History Narrative   Not on file   Social Determinants of Health   Financial Resource  Strain: Not on file  Food Insecurity: No Food Insecurity (10/25/2022)   Hunger Vital Sign    Worried About Running Out of Food in the Last Year: Never true    Ran Out of Food in the Last Year: Never true  Transportation Needs: No Transportation Needs (10/25/2022)   PRAPARE - Administrator, Civil Service (Medical): No    Lack of Transportation (Non-Medical): No  Physical Activity: Not on file  Stress: Not on file  Social Connections: Not on file   Family History  Problem Relation Age of Onset   Stroke Mother    Pseudochol deficiency Neg Hx    Malignant hyperthermia Neg Hx    Hypotension Neg Hx    Anesthesia problems Neg Hx    Scheduled Meds:   HYDROmorphone (DILAUDID) injection  1 mg Intravenous Once   melatonin  6 mg Oral QHS   [START ON 01/07/2023] pantoprazole  40 mg Oral Daily   sodium chloride flush  3 mL Intravenous Q12H   Continuous Infusions:  HYDROmorphone     PRN Meds:.acetaminophen **OR** acetaminophen, albuterol, glycopyrrolate **OR** glycopyrrolate **OR** glycopyrrolate, haloperidol **OR** haloperidol **OR** haloperidol lactate, HYDROmorphone, LORazepam **OR** LORazepam **OR** LORazepam, magic mouthwash, ondansetron **OR** ondansetron (ZOFRAN) IV, polyvinyl alcohol Medications Prior to Admission:  Prior to Admission medications   Medication Sig Start Date End Date Taking? Authorizing Provider  acetaminophen (TYLENOL) 325 MG tablet Take 650 mg by mouth every 4 (four) hours as needed for moderate pain.   Yes [provider]  albuterol (VENTOLIN HFA) 108 (90 Base) MCG/ACT inhaler Inhale 2 puffs into the lungs every 6 (six) hours as needed for wheezing or shortness of breath. 12/24/22  Yes Leroy Sea, MD  Amino Acids-Protein Hydrolys (PRO-STAT PO) Take 30 mLs by mouth in the morning and at bedtime.   Yes [provider]  aspirin EC 81 MG tablet Take 1 tablet (81 mg total) by mouth daily. Swallow whole. Patient taking differently: Take  81 mg by mouth daily. 06/30/21  Yes O'Neal, Ronnald Ramp, MD  Cholecalciferol (VITAMIN D-3) 125 MCG (5000 UT) TABS Take by mouth.   Yes [provider]  clopidogrel (PLAVIX) 75 MG tablet Take 1 tablet (75 mg total) by mouth daily. 11/13/22  Yes Rai, Ripudeep K, MD  diltiazem (CARDIZEM CD) 180 MG 24 hr capsule Take 180 mg by mouth daily.   Yes [provider]  fluticasone (FLONASE) 50 MCG/ACT nasal spray Place 2 sprays into both nostrils daily. 11/13/22  Yes Rai, Ripudeep K, MD  ipratropium-albuterol (DUONEB) 0.5-2.5 (3) MG/3ML SOLN Take 3 mLs by nebulization 3 (three) times daily. 12/31/22 01/07/23 Yes [provider]  loperamide (IMODIUM A-D) 2 MG tablet Take 2 mg by mouth every 6 (six) hours as needed for diarrhea or loose stools.   Yes [provider]  loratadine (CLARITIN) 10 MG tablet Take 10 mg by mouth daily.   Yes [provider]  Magnesium Hydroxide (MILK OF MAGNESIA PO) Take 30 mLs by mouth daily as needed (constipation).   Yes [provider]  melatonin 3 MG TABS tablet Take 6 mg by mouth at bedtime.   Yes [provider]  metoprolol tartrate (LOPRESSOR) 25 MG tablet Take 25  mg by mouth 2 (two) times daily.   Yes [provider]  Multiple Vitamins-Minerals (MULTIVITAMIN WITH MINERALS) tablet Take 1 tablet by mouth daily.   Yes [provider]  Nutritional Supplements (BOOST VHC PO) Take 1 each by mouth in the morning and at bedtime.   Yes [provider]  OXYGEN Inhale 3 L into the lungs daily as needed (keep O2 sat above 90%).   Yes [provider]  pantoprazole (PROTONIX) 40 MG tablet Take 1 tablet (40 mg total) by mouth daily. 11/13/22  Yes Rai, Ripudeep K, MD  potassium chloride SA (KLOR-CON M) 20 MEQ tablet Take 20 mEq by mouth daily.   Yes [provider]  rosuvastatin (CRESTOR) 20 MG tablet TAKE 1 TABLET(20 MG) BY MOUTH DAILY Patient taking differently: Take 20 mg by mouth daily.  07/09/22  Yes [name], MD  senna (SENOKOT) 8.6 MG TABS tablet Take 2 tablets by mouth at bedtime.   Yes [provider]  sodium chloride 1 g tablet Take 1 tablet (1 g total) by mouth 3 (three) times daily with meals. 11/12/22  Yes Rai, Delene Ruffini, MD   Allergies  Allergen Reactions   Penicillins Other (See Comments)   Review of Systems  Unable to perform ROS: Acuity of condition    Physical Exam Vitals and nursing note reviewed.  Constitutional:      General: He is not in acute distress.    Appearance: He is cachectic. He is ill-appearing.  Pulmonary:     Effort: No respiratory distress.     Comments: Increased work of breathing Skin:    General: Skin is warm and dry.  Neurological:     Mental Status: He is unresponsive.     Motor: Weakness present.     Vital Signs: BP 100/77   Pulse 63   Temp 99.4 F (37.4 C) (Tympanic)   Resp (!) 31   SpO2 94%          SpO2: SpO2: 94 % O2 Device:SpO2: 94 % O2 Flow Rate: .   IO: Intake/output summary: No intake or output data in the 24 hours ending January 22, 2023 1449  LBM:   Baseline Weight:   Most recent weight:       Palliative Assessment/Data: PPS 10%     Time In: 1445 Time Out: 1615 Time Total: 90 minutes  Signed by: Haskel Khan, NP   Please contact Palliative Medicine Team phone at [phone] for questions and concerns.  For individual provider: See Amion  *Portions of this note are a verbal dictation therefore any spelling and/or grammatical errors are due to the "[employer]agon [employer] One" system interpretation.

## 2023-01-11 NOTE — Progress Notes (Signed)
Patient transferred from the ED at 1610 PM via bed.  Patient is disoriented * 4. Not responding. Will continue to monitor.

## 2023-01-11 DEATH — deceased

## 2023-01-21 ENCOUNTER — Ambulatory Visit: Payer: Medicare Other | Admitting: Internal Medicine

## 2024-02-28 ENCOUNTER — Ambulatory Visit (HOSPITAL_COMMUNITY): Payer: Self-pay | Admitting: Pharmacist
